# Patient Record
Sex: Male | Born: 1950 | Race: White | Hispanic: No | Marital: Married | Smoking: Former smoker
Health system: Southern US, Community
[De-identification: ages and names within clinical notes are randomized; demographics above are authoritative.]

## PROBLEM LIST (undated history)

## (undated) DIAGNOSIS — J449 Chronic obstructive pulmonary disease, unspecified: Secondary | ICD-10-CM

## (undated) DIAGNOSIS — I219 Acute myocardial infarction, unspecified: Secondary | ICD-10-CM

## (undated) DIAGNOSIS — K7689 Other specified diseases of liver: Secondary | ICD-10-CM

## (undated) DIAGNOSIS — I959 Hypotension, unspecified: Secondary | ICD-10-CM

## (undated) DIAGNOSIS — I4891 Unspecified atrial fibrillation: Secondary | ICD-10-CM

## (undated) DIAGNOSIS — I2510 Atherosclerotic heart disease of native coronary artery without angina pectoris: Secondary | ICD-10-CM

## (undated) DIAGNOSIS — I214 Non-ST elevation (NSTEMI) myocardial infarction: Secondary | ICD-10-CM

## (undated) DIAGNOSIS — I509 Heart failure, unspecified: Secondary | ICD-10-CM

## (undated) DIAGNOSIS — I700 Atherosclerosis of aorta: Secondary | ICD-10-CM

## (undated) DIAGNOSIS — Z720 Tobacco use: Secondary | ICD-10-CM

## (undated) DIAGNOSIS — E785 Hyperlipidemia, unspecified: Secondary | ICD-10-CM

## (undated) DIAGNOSIS — F329 Major depressive disorder, single episode, unspecified: Secondary | ICD-10-CM

## (undated) DIAGNOSIS — F1010 Alcohol abuse, uncomplicated: Secondary | ICD-10-CM

## (undated) DIAGNOSIS — Z7902 Long term (current) use of antithrombotics/antiplatelets: Secondary | ICD-10-CM

## (undated) DIAGNOSIS — I4892 Unspecified atrial flutter: Secondary | ICD-10-CM

## (undated) DIAGNOSIS — Z972 Presence of dental prosthetic device (complete) (partial): Secondary | ICD-10-CM

## (undated) DIAGNOSIS — I739 Peripheral vascular disease, unspecified: Secondary | ICD-10-CM

## (undated) DIAGNOSIS — I10 Essential (primary) hypertension: Secondary | ICD-10-CM

## (undated) DIAGNOSIS — G453 Amaurosis fugax: Secondary | ICD-10-CM

## (undated) DIAGNOSIS — J810 Acute pulmonary edema: Secondary | ICD-10-CM

## (undated) DIAGNOSIS — J9601 Acute respiratory failure with hypoxia: Secondary | ICD-10-CM

## (undated) DIAGNOSIS — D689 Coagulation defect, unspecified: Secondary | ICD-10-CM

## (undated) DIAGNOSIS — I6529 Occlusion and stenosis of unspecified carotid artery: Secondary | ICD-10-CM

## (undated) DIAGNOSIS — F32A Depression, unspecified: Secondary | ICD-10-CM

## (undated) DIAGNOSIS — D72829 Elevated white blood cell count, unspecified: Secondary | ICD-10-CM

## (undated) HISTORY — PX: CORONARY ARTERY BYPASS GRAFT: SHX141

## (undated) HISTORY — DX: Essential (primary) hypertension: I10

## (undated) HISTORY — DX: Depression, unspecified: F32.A

## (undated) HISTORY — DX: Unspecified atrial flutter: I48.92

## (undated) HISTORY — DX: Major depressive disorder, single episode, unspecified: F32.9

## (undated) HISTORY — DX: Hyperlipidemia, unspecified: E78.5

---

## 1998-12-26 ENCOUNTER — Encounter: Payer: Self-pay | Admitting: Emergency Medicine

## 1998-12-26 ENCOUNTER — Emergency Department (HOSPITAL_COMMUNITY): Admission: EM | Admit: 1998-12-26 | Discharge: 1998-12-26 | Payer: Self-pay | Admitting: Emergency Medicine

## 2001-05-20 ENCOUNTER — Encounter: Payer: Self-pay | Admitting: Emergency Medicine

## 2001-05-20 ENCOUNTER — Emergency Department (HOSPITAL_COMMUNITY): Admission: AC | Admit: 2001-05-20 | Discharge: 2001-05-20 | Payer: Self-pay

## 2001-05-22 ENCOUNTER — Emergency Department (HOSPITAL_COMMUNITY): Admission: EM | Admit: 2001-05-22 | Discharge: 2001-05-22 | Payer: Self-pay

## 2003-05-31 ENCOUNTER — Encounter: Admission: RE | Admit: 2003-05-31 | Discharge: 2003-05-31 | Payer: Self-pay | Admitting: Psychiatry

## 2003-06-07 ENCOUNTER — Encounter: Admission: RE | Admit: 2003-06-07 | Discharge: 2003-06-07 | Payer: Self-pay | Admitting: Psychiatry

## 2004-06-10 ENCOUNTER — Ambulatory Visit (HOSPITAL_COMMUNITY): Payer: Self-pay | Admitting: Psychiatry

## 2004-07-08 ENCOUNTER — Ambulatory Visit (HOSPITAL_COMMUNITY): Payer: Self-pay | Admitting: Psychiatry

## 2004-08-07 ENCOUNTER — Ambulatory Visit (HOSPITAL_COMMUNITY): Payer: Self-pay | Admitting: Psychiatry

## 2004-09-30 ENCOUNTER — Ambulatory Visit (HOSPITAL_COMMUNITY): Payer: Self-pay | Admitting: Psychiatry

## 2004-11-27 ENCOUNTER — Ambulatory Visit (HOSPITAL_COMMUNITY): Payer: Self-pay | Admitting: Psychiatry

## 2005-01-29 ENCOUNTER — Ambulatory Visit (HOSPITAL_COMMUNITY): Payer: Self-pay | Admitting: Physician Assistant

## 2005-04-14 ENCOUNTER — Ambulatory Visit (HOSPITAL_COMMUNITY): Payer: Self-pay | Admitting: Psychiatry

## 2005-06-16 ENCOUNTER — Ambulatory Visit (HOSPITAL_COMMUNITY): Payer: Self-pay | Admitting: Psychiatry

## 2005-08-18 ENCOUNTER — Ambulatory Visit (HOSPITAL_COMMUNITY): Payer: Self-pay | Admitting: Psychiatry

## 2005-11-05 ENCOUNTER — Ambulatory Visit (HOSPITAL_COMMUNITY): Payer: Self-pay | Admitting: Psychiatry

## 2006-01-26 ENCOUNTER — Ambulatory Visit (HOSPITAL_COMMUNITY): Payer: Self-pay | Admitting: Psychiatry

## 2006-04-22 ENCOUNTER — Ambulatory Visit (HOSPITAL_COMMUNITY): Payer: Self-pay | Admitting: Psychiatry

## 2006-06-10 ENCOUNTER — Ambulatory Visit (HOSPITAL_COMMUNITY): Payer: Self-pay | Admitting: Psychiatry

## 2006-07-30 ENCOUNTER — Ambulatory Visit: Payer: Self-pay | Admitting: Family Medicine

## 2006-08-27 ENCOUNTER — Ambulatory Visit: Payer: Self-pay | Admitting: Family Medicine

## 2006-08-27 LAB — CONVERTED CEMR LAB
Hgb A1c MFr Bld: 5.8 %
Microalbumin U total vol: 5 mg/L

## 2006-09-07 ENCOUNTER — Ambulatory Visit (HOSPITAL_COMMUNITY): Payer: Self-pay | Admitting: Psychiatry

## 2006-12-07 ENCOUNTER — Ambulatory Visit (HOSPITAL_COMMUNITY): Payer: Self-pay | Admitting: Psychiatry

## 2007-02-23 ENCOUNTER — Encounter: Payer: Self-pay | Admitting: Family Medicine

## 2007-02-24 DIAGNOSIS — R945 Abnormal results of liver function studies: Secondary | ICD-10-CM | POA: Insufficient documentation

## 2007-02-24 DIAGNOSIS — F411 Generalized anxiety disorder: Secondary | ICD-10-CM | POA: Insufficient documentation

## 2007-02-24 DIAGNOSIS — I10 Essential (primary) hypertension: Secondary | ICD-10-CM | POA: Insufficient documentation

## 2007-02-24 DIAGNOSIS — F528 Other sexual dysfunction not due to a substance or known physiological condition: Secondary | ICD-10-CM

## 2007-02-25 ENCOUNTER — Encounter: Payer: Self-pay | Admitting: Family Medicine

## 2007-03-19 ENCOUNTER — Ambulatory Visit (HOSPITAL_COMMUNITY): Payer: Self-pay | Admitting: Psychiatry

## 2007-06-09 ENCOUNTER — Ambulatory Visit (HOSPITAL_COMMUNITY): Payer: Self-pay | Admitting: Psychiatry

## 2007-09-08 ENCOUNTER — Ambulatory Visit (HOSPITAL_COMMUNITY): Payer: Self-pay | Admitting: Psychiatry

## 2007-12-08 ENCOUNTER — Ambulatory Visit (HOSPITAL_COMMUNITY): Payer: Self-pay | Admitting: Psychiatry

## 2008-03-08 ENCOUNTER — Ambulatory Visit (HOSPITAL_COMMUNITY): Payer: Self-pay | Admitting: Psychiatry

## 2008-04-26 ENCOUNTER — Ambulatory Visit (HOSPITAL_COMMUNITY): Payer: Self-pay | Admitting: Psychiatry

## 2008-05-10 ENCOUNTER — Inpatient Hospital Stay (HOSPITAL_COMMUNITY): Admission: AD | Admit: 2008-05-10 | Discharge: 2008-05-12 | Payer: Self-pay | Admitting: Psychiatry

## 2008-05-10 ENCOUNTER — Ambulatory Visit: Payer: Self-pay | Admitting: Psychiatry

## 2008-05-22 ENCOUNTER — Ambulatory Visit (HOSPITAL_COMMUNITY): Payer: Self-pay | Admitting: Psychiatry

## 2008-06-14 ENCOUNTER — Ambulatory Visit (HOSPITAL_COMMUNITY): Payer: Self-pay | Admitting: Marriage and Family Therapist

## 2008-06-19 ENCOUNTER — Ambulatory Visit (HOSPITAL_COMMUNITY): Payer: Self-pay | Admitting: Psychiatry

## 2008-06-22 ENCOUNTER — Ambulatory Visit (HOSPITAL_COMMUNITY): Payer: Self-pay | Admitting: Marriage and Family Therapist

## 2008-06-29 ENCOUNTER — Ambulatory Visit (HOSPITAL_COMMUNITY): Payer: Self-pay | Admitting: Marriage and Family Therapist

## 2008-07-06 ENCOUNTER — Ambulatory Visit (HOSPITAL_COMMUNITY): Payer: Self-pay | Admitting: Marriage and Family Therapist

## 2008-07-13 ENCOUNTER — Ambulatory Visit (HOSPITAL_COMMUNITY): Payer: Self-pay | Admitting: Marriage and Family Therapist

## 2008-07-19 ENCOUNTER — Ambulatory Visit (HOSPITAL_COMMUNITY): Payer: Self-pay | Admitting: Psychiatry

## 2008-07-20 ENCOUNTER — Ambulatory Visit (HOSPITAL_COMMUNITY): Payer: Self-pay | Admitting: Marriage and Family Therapist

## 2008-07-27 ENCOUNTER — Ambulatory Visit (HOSPITAL_COMMUNITY): Payer: Self-pay | Admitting: Marriage and Family Therapist

## 2008-08-01 ENCOUNTER — Ambulatory Visit (HOSPITAL_COMMUNITY): Payer: Self-pay | Admitting: Marriage and Family Therapist

## 2008-08-03 ENCOUNTER — Ambulatory Visit (HOSPITAL_COMMUNITY): Payer: Self-pay | Admitting: Marriage and Family Therapist

## 2008-08-10 ENCOUNTER — Ambulatory Visit (HOSPITAL_COMMUNITY): Payer: Self-pay | Admitting: Marriage and Family Therapist

## 2008-08-15 ENCOUNTER — Ambulatory Visit (HOSPITAL_COMMUNITY): Payer: Self-pay | Admitting: Marriage and Family Therapist

## 2008-08-17 ENCOUNTER — Ambulatory Visit (HOSPITAL_COMMUNITY): Payer: Self-pay | Admitting: Marriage and Family Therapist

## 2008-08-21 ENCOUNTER — Ambulatory Visit (HOSPITAL_COMMUNITY): Payer: Self-pay | Admitting: Marriage and Family Therapist

## 2008-08-23 ENCOUNTER — Ambulatory Visit (HOSPITAL_COMMUNITY): Payer: Self-pay | Admitting: Psychiatry

## 2008-08-28 ENCOUNTER — Ambulatory Visit (HOSPITAL_COMMUNITY): Payer: Self-pay | Admitting: Marriage and Family Therapist

## 2008-09-07 ENCOUNTER — Ambulatory Visit (HOSPITAL_COMMUNITY): Payer: Self-pay | Admitting: Marriage and Family Therapist

## 2008-09-14 ENCOUNTER — Ambulatory Visit (HOSPITAL_COMMUNITY): Payer: Self-pay | Admitting: Marriage and Family Therapist

## 2008-10-02 ENCOUNTER — Ambulatory Visit (HOSPITAL_COMMUNITY): Payer: Self-pay | Admitting: Psychiatry

## 2008-10-11 ENCOUNTER — Ambulatory Visit (HOSPITAL_COMMUNITY): Payer: Self-pay | Admitting: Marriage and Family Therapist

## 2008-10-18 ENCOUNTER — Ambulatory Visit (HOSPITAL_COMMUNITY): Payer: Self-pay | Admitting: Marriage and Family Therapist

## 2008-10-26 ENCOUNTER — Ambulatory Visit (HOSPITAL_COMMUNITY): Payer: Self-pay | Admitting: Marriage and Family Therapist

## 2008-11-02 ENCOUNTER — Ambulatory Visit (HOSPITAL_COMMUNITY): Payer: Self-pay | Admitting: Marriage and Family Therapist

## 2008-11-14 ENCOUNTER — Ambulatory Visit (HOSPITAL_COMMUNITY): Payer: Self-pay | Admitting: Marriage and Family Therapist

## 2008-11-23 ENCOUNTER — Ambulatory Visit (HOSPITAL_COMMUNITY): Payer: Self-pay | Admitting: Marriage and Family Therapist

## 2008-11-29 ENCOUNTER — Ambulatory Visit (HOSPITAL_COMMUNITY): Payer: Self-pay | Admitting: Marriage and Family Therapist

## 2008-12-04 ENCOUNTER — Ambulatory Visit (HOSPITAL_COMMUNITY): Payer: Self-pay | Admitting: Psychiatry

## 2008-12-06 ENCOUNTER — Ambulatory Visit (HOSPITAL_COMMUNITY): Payer: Self-pay | Admitting: Marriage and Family Therapist

## 2008-12-13 ENCOUNTER — Ambulatory Visit (HOSPITAL_COMMUNITY): Payer: Self-pay | Admitting: Marriage and Family Therapist

## 2008-12-20 ENCOUNTER — Ambulatory Visit (HOSPITAL_COMMUNITY): Payer: Self-pay | Admitting: Marriage and Family Therapist

## 2008-12-27 ENCOUNTER — Ambulatory Visit (HOSPITAL_COMMUNITY): Payer: Self-pay | Admitting: Marriage and Family Therapist

## 2009-01-11 ENCOUNTER — Ambulatory Visit (HOSPITAL_COMMUNITY): Payer: Self-pay | Admitting: Marriage and Family Therapist

## 2009-01-29 ENCOUNTER — Ambulatory Visit (HOSPITAL_COMMUNITY): Payer: Self-pay | Admitting: Psychiatry

## 2009-01-30 ENCOUNTER — Ambulatory Visit (HOSPITAL_COMMUNITY): Payer: Self-pay | Admitting: Marriage and Family Therapist

## 2009-02-08 ENCOUNTER — Ambulatory Visit (HOSPITAL_COMMUNITY): Payer: Self-pay | Admitting: Marriage and Family Therapist

## 2009-02-15 ENCOUNTER — Ambulatory Visit (HOSPITAL_COMMUNITY): Payer: Self-pay | Admitting: Marriage and Family Therapist

## 2009-03-01 ENCOUNTER — Ambulatory Visit (HOSPITAL_COMMUNITY): Payer: Self-pay | Admitting: Marriage and Family Therapist

## 2009-03-08 ENCOUNTER — Ambulatory Visit (HOSPITAL_COMMUNITY): Payer: Self-pay | Admitting: Marriage and Family Therapist

## 2009-03-15 ENCOUNTER — Ambulatory Visit (HOSPITAL_COMMUNITY): Payer: Self-pay | Admitting: Licensed Clinical Social Worker

## 2009-03-22 ENCOUNTER — Ambulatory Visit (HOSPITAL_COMMUNITY): Payer: Self-pay | Admitting: Licensed Clinical Social Worker

## 2009-03-23 ENCOUNTER — Ambulatory Visit (HOSPITAL_COMMUNITY): Payer: Self-pay | Admitting: Psychiatry

## 2009-04-12 ENCOUNTER — Ambulatory Visit (HOSPITAL_COMMUNITY): Payer: Self-pay | Admitting: Marriage and Family Therapist

## 2009-04-19 ENCOUNTER — Ambulatory Visit (HOSPITAL_COMMUNITY): Payer: Self-pay | Admitting: Marriage and Family Therapist

## 2009-04-26 ENCOUNTER — Ambulatory Visit (HOSPITAL_COMMUNITY): Payer: Self-pay | Admitting: Marriage and Family Therapist

## 2009-05-10 ENCOUNTER — Ambulatory Visit (HOSPITAL_COMMUNITY): Payer: Self-pay | Admitting: Licensed Clinical Social Worker

## 2009-05-14 ENCOUNTER — Ambulatory Visit (HOSPITAL_COMMUNITY): Payer: Self-pay | Admitting: Psychiatry

## 2009-05-24 ENCOUNTER — Ambulatory Visit (HOSPITAL_COMMUNITY): Payer: Self-pay | Admitting: Licensed Clinical Social Worker

## 2009-06-07 ENCOUNTER — Ambulatory Visit (HOSPITAL_COMMUNITY): Payer: Self-pay | Admitting: Marriage and Family Therapist

## 2009-06-21 ENCOUNTER — Ambulatory Visit (HOSPITAL_COMMUNITY): Payer: Self-pay | Admitting: Marriage and Family Therapist

## 2009-07-05 ENCOUNTER — Ambulatory Visit (HOSPITAL_COMMUNITY): Payer: Self-pay | Admitting: Marriage and Family Therapist

## 2009-07-09 ENCOUNTER — Emergency Department: Payer: Self-pay | Admitting: Emergency Medicine

## 2009-07-18 ENCOUNTER — Ambulatory Visit (HOSPITAL_COMMUNITY): Payer: Self-pay | Admitting: Psychiatry

## 2009-07-19 ENCOUNTER — Ambulatory Visit (HOSPITAL_COMMUNITY): Payer: Self-pay | Admitting: Marriage and Family Therapist

## 2009-08-02 ENCOUNTER — Ambulatory Visit (HOSPITAL_COMMUNITY): Payer: Self-pay | Admitting: Marriage and Family Therapist

## 2009-08-16 ENCOUNTER — Ambulatory Visit (HOSPITAL_COMMUNITY): Payer: Self-pay | Admitting: Marriage and Family Therapist

## 2009-09-03 ENCOUNTER — Ambulatory Visit (HOSPITAL_COMMUNITY): Payer: Self-pay | Admitting: Psychiatry

## 2009-09-07 ENCOUNTER — Ambulatory Visit (HOSPITAL_COMMUNITY): Payer: Self-pay | Admitting: Psychiatry

## 2009-09-17 ENCOUNTER — Ambulatory Visit (HOSPITAL_COMMUNITY): Payer: Self-pay | Admitting: Psychiatry

## 2009-10-16 ENCOUNTER — Ambulatory Visit (HOSPITAL_COMMUNITY): Payer: Self-pay | Admitting: Marriage and Family Therapist

## 2009-11-09 ENCOUNTER — Ambulatory Visit (HOSPITAL_COMMUNITY): Payer: Self-pay | Admitting: Psychiatry

## 2009-11-13 ENCOUNTER — Ambulatory Visit (HOSPITAL_COMMUNITY): Payer: Self-pay | Admitting: Marriage and Family Therapist

## 2009-12-11 ENCOUNTER — Ambulatory Visit (HOSPITAL_COMMUNITY): Payer: Self-pay | Admitting: Marriage and Family Therapist

## 2010-01-04 ENCOUNTER — Ambulatory Visit (HOSPITAL_COMMUNITY): Payer: Self-pay | Admitting: Psychiatry

## 2010-01-15 ENCOUNTER — Ambulatory Visit (HOSPITAL_COMMUNITY): Payer: Self-pay | Admitting: Marriage and Family Therapist

## 2010-02-19 ENCOUNTER — Ambulatory Visit (HOSPITAL_COMMUNITY): Payer: Self-pay | Admitting: Marriage and Family Therapist

## 2010-03-04 ENCOUNTER — Ambulatory Visit (HOSPITAL_COMMUNITY): Payer: Self-pay | Admitting: Psychiatry

## 2010-03-19 ENCOUNTER — Ambulatory Visit (HOSPITAL_COMMUNITY): Payer: Self-pay | Admitting: Marriage and Family Therapist

## 2010-04-16 ENCOUNTER — Ambulatory Visit (HOSPITAL_COMMUNITY): Payer: Self-pay | Admitting: Marriage and Family Therapist

## 2010-04-29 ENCOUNTER — Ambulatory Visit (HOSPITAL_COMMUNITY): Payer: Self-pay | Admitting: Psychiatry

## 2010-05-16 ENCOUNTER — Ambulatory Visit (HOSPITAL_COMMUNITY): Payer: Self-pay | Admitting: Marriage and Family Therapist

## 2010-06-27 ENCOUNTER — Ambulatory Visit (HOSPITAL_COMMUNITY): Payer: Self-pay | Admitting: Marriage and Family Therapist

## 2010-07-22 ENCOUNTER — Ambulatory Visit (HOSPITAL_COMMUNITY): Payer: Self-pay | Admitting: Psychiatry

## 2010-07-30 ENCOUNTER — Ambulatory Visit (HOSPITAL_COMMUNITY): Payer: Self-pay | Admitting: Marriage and Family Therapist

## 2010-09-02 ENCOUNTER — Ambulatory Visit (HOSPITAL_COMMUNITY): Payer: Self-pay | Admitting: Marriage and Family Therapist

## 2010-09-16 ENCOUNTER — Ambulatory Visit (HOSPITAL_COMMUNITY): Payer: Self-pay | Admitting: Psychiatry

## 2010-11-11 ENCOUNTER — Encounter (HOSPITAL_COMMUNITY): Payer: BC Managed Care – PPO | Admitting: Marriage and Family Therapist

## 2010-11-11 DIAGNOSIS — F339 Major depressive disorder, recurrent, unspecified: Secondary | ICD-10-CM

## 2010-11-20 ENCOUNTER — Encounter (HOSPITAL_COMMUNITY): Payer: Self-pay | Admitting: Psychiatry

## 2010-12-04 ENCOUNTER — Encounter (HOSPITAL_COMMUNITY): Payer: BC Managed Care – PPO | Admitting: Psychiatry

## 2010-12-04 DIAGNOSIS — F331 Major depressive disorder, recurrent, moderate: Secondary | ICD-10-CM

## 2011-02-05 ENCOUNTER — Encounter (HOSPITAL_COMMUNITY): Payer: BC Managed Care – PPO | Admitting: Psychiatry

## 2011-02-07 ENCOUNTER — Encounter (HOSPITAL_COMMUNITY): Payer: BC Managed Care – PPO | Admitting: Psychiatry

## 2011-02-07 DIAGNOSIS — F331 Major depressive disorder, recurrent, moderate: Secondary | ICD-10-CM
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
## 2011-04-09 ENCOUNTER — Encounter (HOSPITAL_COMMUNITY): Payer: BC Managed Care – PPO | Admitting: Psychiatry

## 2011-04-09 DIAGNOSIS — F331 Major depressive disorder, recurrent, moderate: Secondary | ICD-10-CM

## 2011-06-11 ENCOUNTER — Encounter (INDEPENDENT_AMBULATORY_CARE_PROVIDER_SITE_OTHER): Payer: BC Managed Care – PPO | Admitting: Psychiatry

## 2011-06-11 DIAGNOSIS — F331 Major depressive disorder, recurrent, moderate: Secondary | ICD-10-CM

## 2011-06-27 LAB — DRUGS OF ABUSE SCREEN W/O ALC, ROUTINE URINE
Amphetamine Screen, Ur: NEGATIVE
Benzodiazepines.: NEGATIVE
Phencyclidine (PCP): NEGATIVE
Propoxyphene: NEGATIVE

## 2011-06-27 LAB — COMPREHENSIVE METABOLIC PANEL
BUN: 12
Calcium: 9.4
GFR calc non Af Amer: >60
Glucose, Bld: 88
Total Protein: 6.3

## 2011-06-27 LAB — URINALYSIS, ROUTINE W REFLEX MICROSCOPIC
Bilirubin Urine: NEGATIVE
Glucose, UA: NEGATIVE
Hgb urine dipstick: NEGATIVE
Ketones, ur: NEGATIVE
Nitrite: NEGATIVE
pH: 5.5

## 2011-06-27 LAB — CBC
HCT: 47.5
Hemoglobin: 15.9
MCHC: 33.4
MCV: 91.2
RDW: 14.8

## 2011-08-05 ENCOUNTER — Other Ambulatory Visit (HOSPITAL_COMMUNITY): Payer: Self-pay

## 2011-08-06 ENCOUNTER — Encounter (HOSPITAL_COMMUNITY): Payer: Self-pay | Admitting: Psychiatry

## 2011-08-06 ENCOUNTER — Ambulatory Visit (INDEPENDENT_AMBULATORY_CARE_PROVIDER_SITE_OTHER): Payer: BC Managed Care – PPO | Admitting: Psychiatry

## 2011-08-06 DIAGNOSIS — F331 Major depressive disorder, recurrent, moderate: Secondary | ICD-10-CM

## 2011-08-06 MED ORDER — SERTRALINE HCL 100 MG PO TABS: 100.0000 mg | ORAL_TABLET | Freq: Every day | ORAL | Status: DC

## 2011-08-06 MED ORDER — CLONAZEPAM 0.5 MG PO TABS: 0.5000 mg | ORAL_TABLET | Freq: Every day | ORAL | Status: DC

## 2011-08-06 NOTE — Progress Notes (Signed)
Patient in today for his followup appointment. He needs a refill of his Zoloft and clonazepam. He's been stable on these medications and reported no side effects. He is not taking risperidone anymore. He reported less symptoms of depression anxiety. He is sleeping good and there are longer crying spells. There is no paranoia agitation or anger. Patient is calm cooperative and pleasant. His speech is clear and coherent. His thought was logical linear and goal directed. There are no psychotic symptoms present. He reported his mood is good and his affect was pleasant. He denies any active or passive suicidal thinking homicidal thinking or any visual or auditory hallucination. He is alert and oriented x3. There are no abnormal movements noted. His insight judgment impulse control is okay. Assessment Major depressive disorder recurrent Plan we'll continue his current medications Zoloft and Klonopin and I will see him again in 2 months.

## 2011-10-06 ENCOUNTER — Ambulatory Visit (HOSPITAL_COMMUNITY): Payer: BC Managed Care – PPO | Admitting: Psychiatry

## 2011-10-22 ENCOUNTER — Ambulatory Visit (INDEPENDENT_AMBULATORY_CARE_PROVIDER_SITE_OTHER): Payer: BC Managed Care – PPO | Admitting: Psychiatry

## 2011-10-22 DIAGNOSIS — F331 Major depressive disorder, recurrent, moderate: Secondary | ICD-10-CM

## 2011-10-22 DIAGNOSIS — F329 Major depressive disorder, single episode, unspecified: Secondary | ICD-10-CM

## 2011-10-22 MED ORDER — SERTRALINE HCL 100 MG PO TABS: 100.0000 mg | ORAL_TABLET | Freq: Every day | ORAL | Status: DC

## 2011-10-22 NOTE — Progress Notes (Signed)
Patient came in today for his followup appointment. She's been compliant with his Zoloft and takes Klonopin only as needed. A good Christmas and holiday. His granddaughter is also getting better and gained some weight. Denies any agitation anger or mood swings. He denies any side effects of medication. He is sleeping better and recently there are no crying spells. He denies any agitation anger or paranoid thinking. He likes to continue his Zoloft and Klonopin only as needed. He still has one refill remaining on Klonopin. He does not drink alcohol or use illegal substances the  Mental status examination Patient is casually dressed and well groomed. His speech is soft clear and coherent. His thought process is logical linear and goal-directed. He described his mood is anxious and his affect is constricted. He denies any active or passive suicidal thoughts or homicidal thoughts. There no psychotic symptoms present. He's alert and oriented x3. His insight judgment and pulse control is okay.  Assessment Maj. depressive disorder  Plan I will continue his Zoloft 100 mg daily. He is still have Klonopin refill remaining which he uses as needed. He never ask only refills of Klonopin. I have explained risks and benefits of medication in detail. I will see him again in 3 months

## 2012-01-21 ENCOUNTER — Ambulatory Visit (HOSPITAL_COMMUNITY): Payer: BC Managed Care – PPO | Admitting: Psychiatry

## 2012-02-11 ENCOUNTER — Ambulatory Visit (INDEPENDENT_AMBULATORY_CARE_PROVIDER_SITE_OTHER): Payer: BC Managed Care – PPO | Admitting: Psychiatry

## 2012-02-11 ENCOUNTER — Encounter (HOSPITAL_COMMUNITY): Payer: Self-pay | Admitting: Psychiatry

## 2012-02-11 DIAGNOSIS — F331 Major depressive disorder, recurrent, moderate: Secondary | ICD-10-CM

## 2012-02-11 DIAGNOSIS — F329 Major depressive disorder, single episode, unspecified: Secondary | ICD-10-CM

## 2012-02-11 MED ORDER — SERTRALINE HCL 100 MG PO TABS: 100.0000 mg | ORAL_TABLET | Freq: Every day | ORAL | Status: DC

## 2012-02-11 MED ORDER — CLONAZEPAM 0.5 MG PO TABS: 0.5000 mg | ORAL_TABLET | Freq: Every day | ORAL | Status: DC

## 2012-02-11 NOTE — Progress Notes (Signed)
Chief complaint Medication management and followup.  History of presenting illness Patient is 61 year old Caucasian married male who came for his followup appointment.  Patient has been compliant with his Zoloft and Klonopin as needed.  He had missed his appointment as he was out of the town to attend funeral .  Overall his mood has been stable.  He denies any agitation anger mood swing.  He denies any recent crying spells.  He likes his current Zoloft.  He has no issues including any tremors or shakes.  He's not drinking or using any illegal substance.  He sleeps fine.  He has some anxiety however he denies any recent panic attack.    Current psychiatric medication Zoloft 100 mg daily Klonopin 0.5 mg one tablet as needed  Past psychiatric history Patient has history of sued depression anxiety.  He has at least one psychiatric admission in 2009.  At that time he was very depressed and her a lot of stress.  Denies any history of suicidal attempt.  He do not recall taking any other medication however he was given Risperdal which he took for a long time due to paranoia.  Alcohol and substance use history Patient endorse history of heavy drinking however he quit drinking 9 years ago.  Medical history Patient has history of hypertension but he denies taking any medication.  Mental status examination Patient is casually dressed and well groomed.  He is pleasant calm and cooperative.  His speech is soft clear and coherent. His thought process is logical linear and goal-directed.  He maintained good eye contact.  He described his mood is anxious and his affect is constricted. He denies any active or passive suicidal thoughts or homicidal thoughts. There no psychotic symptoms present. He's alert and oriented x3. His insight judgment and pulse control is okay.  Assessment Axis I Maj. depressive disorder Axis II deferred Axis III history of hypertension Axis IV mild to moderate  Plan I will  continue his Zoloft 100 mg daily. He is still have Klonopin refill remaining which he uses as needed. He never ask only refills of Klonopin. I have explained risks and benefits of medication in detail. I will see him again in 3 months

## 2012-03-03 ENCOUNTER — Ambulatory Visit (HOSPITAL_COMMUNITY): Payer: Self-pay | Admitting: Psychiatry

## 2012-05-10 ENCOUNTER — Ambulatory Visit (HOSPITAL_COMMUNITY): Payer: Self-pay | Admitting: Psychiatry

## 2012-06-02 ENCOUNTER — Ambulatory Visit (INDEPENDENT_AMBULATORY_CARE_PROVIDER_SITE_OTHER): Payer: BC Managed Care – PPO | Admitting: Psychiatry

## 2012-06-02 ENCOUNTER — Encounter (HOSPITAL_COMMUNITY): Payer: Self-pay | Admitting: Psychiatry

## 2012-06-02 VITALS — BP 160/76 | HR 47 | Wt 173.6 lb

## 2012-06-02 DIAGNOSIS — F329 Major depressive disorder, single episode, unspecified: Secondary | ICD-10-CM

## 2012-06-02 DIAGNOSIS — F331 Major depressive disorder, recurrent, moderate: Secondary | ICD-10-CM

## 2012-06-02 MED ORDER — SERTRALINE HCL 100 MG PO TABS: 100.0000 mg | ORAL_TABLET | Freq: Every day | ORAL | Status: DC

## 2012-06-02 MED ORDER — CLONAZEPAM 0.5 MG PO TABS: 0.5000 mg | ORAL_TABLET | Freq: Every day | ORAL | Status: DC

## 2012-06-02 NOTE — Progress Notes (Signed)
Chief complaint Medication management and followup.  History of presenting illness Patient is 61 year old Caucasian married male who came for his followup appointment.  Patient has been compliant with his Zoloft.  Today he mentioned that sometime he takes Risperdal however he has not requested any refill since September 2012.  He still takes Klonopin only when he is very anxious.  He denies any agitation anger mood swing.  He sleeps good.  He's not drinking.  His grandson is also doing better.  His excited as he is going to [place].  He denies any crying spells.  He denies any tremors or shakes.  He's not using any illegal substance.  Current psychiatric medication Zoloft 100 mg daily Risperidal? Klonopin 0.5 mg one tablet as needed  Past psychiatric history Patient has history of severe depression anxiety.  He has at least one psychiatric admission in 2009.  At that time he was very depressed and her a lot of stress.  Denies any history of suicidal attempt.  However he has history of paranoia.    Alcohol and substance use history Patient endorse history of heavy drinking in past.   Medical history Patient has history of hypertension but he denies taking any medication.  Mental status examination Patient is casually dressed and well groomed.  He is pleasant calm and cooperative.  His speech is soft clear and coherent. His thought process is logical linear and goal-directed.  He maintained good eye contact.  He described his mood is anxious and his affect is constricted. He denies any active or passive suicidal thoughts or homicidal thoughts.  He denies any auditory or visual hallucination.  There were no flight of ideas or loose association.  There no psychotic symptoms present. He's alert and oriented x3. His insight judgment and pulse control is okay.  Assessment Axis I Maj. depressive disorder Axis II deferred Axis III history of hypertension Axis IV mild to moderate  Plan I talked to  him about his Risperdal which has not given since September 2012.  I recommend to call us and provide detail about Risperdal dosage and direction.  He's been getting Risperdal from different pharmacy and do not recall which pharmacy has refill his last prescription.  He is not taking Klonopin every day , I will provide Klonopin only 30 pills however I refilled Zoloft but additional 2 refills.  I recommend to call us if he is any question or concern about the medication if he feels worsening of the symptom.  Time spent 30 minutes.

## 2012-08-24 ENCOUNTER — Telehealth: Payer: Self-pay | Admitting: Medical Oncology

## 2012-08-24 NOTE — Telephone Encounter (Signed)
Erroneous encounter

## 2012-09-01 ENCOUNTER — Ambulatory Visit (HOSPITAL_COMMUNITY): Payer: Self-pay | Admitting: Psychiatry

## 2012-09-09 ENCOUNTER — Encounter (HOSPITAL_COMMUNITY): Payer: Self-pay | Admitting: Psychiatry

## 2012-09-09 ENCOUNTER — Ambulatory Visit (INDEPENDENT_AMBULATORY_CARE_PROVIDER_SITE_OTHER): Payer: BC Managed Care – PPO | Admitting: Psychiatry

## 2012-09-09 DIAGNOSIS — F329 Major depressive disorder, single episode, unspecified: Secondary | ICD-10-CM

## 2012-09-09 DIAGNOSIS — F331 Major depressive disorder, recurrent, moderate: Secondary | ICD-10-CM

## 2012-09-09 MED ORDER — SERTRALINE HCL 50 MG PO TABS: 50.0000 mg | ORAL_TABLET | Freq: Every day | ORAL | Status: DC

## 2012-09-09 MED ORDER — CLONAZEPAM 0.5 MG PO TABS: 0.5000 mg | ORAL_TABLET | Freq: Every day | ORAL | Status: DC

## 2012-09-09 NOTE — Progress Notes (Signed)
Patient ID: Dennis Preston, male   DOB: 1951-07-05, 61 y.o.   MRN: [id number] Chief complaint Medication management and followup.  History of presenting illness Patient is 61 year old Caucasian married male who came for his followup appointment.  He stopped taking Risperdal more than 6 months ago .  He is taking Zoloft 50 mg only for past 2 months.  He cut down his Zoloft on his own as feel he does not needed.  He's actually doing very well on 50 mg of Zoloft.  He denies any agitation anger mood swing.  He is sleeping better.  He denies any anxiety or any crying spells.  He is sleeping better.  He takes Klonopin for severe anxiety .  He was given Klonopin 30 tablet in September and he still has few remaining.  He does not drink or ask for early refills of Klonopin.  He denies any side effects of medication.  His daughter is doing very well.  He had a nice Thanksgiving and is hoping to have a nice Christmas.    Current psychiatric medication Zoloft 50 mg daily Risperidal? Klonopin 0.5 mg one tablet as needed  Past psychiatric history Patient has history of severe depression anxiety.  He has at least one psychiatric admission in 2009.  At that time he was very depressed and her a lot of stress.  Denies any history of suicidal attempt.  However he has history of paranoia.    Alcohol and substance use history Patient endorse history of heavy drinking in past.   Medical history Patient has history of hypertension but he claims that his blood pressure is okay.  He does not take any medication for his blood pressure.  He see Dr. Geralyn Corwin in [place].  He has a history of high liver enzymes however he has not done any blood test in recent months.    Review of Systems  Constitutional: Negative.   HENT: Negative for neck pain.   Musculoskeletal: Positive for back pain. Negative for myalgias and joint pain.  Neurological: Negative.   Psychiatric/Behavioral: Negative for depression, suicidal ideas,  hallucinations and substance abuse. The patient is nervous/anxious. The patient does not have insomnia.    Mental status examination Patient is casually dressed and well groomed.  He is pleasant calm and cooperative.  His speech is soft clear and coherent. His thought process is logical linear and goal-directed.  He maintained good eye contact.  He described his mood is anxious and his affect is constricted. He denies any active or passive suicidal thoughts or homicidal thoughts.  He denies any auditory or visual hallucination.  There were no flight of ideas or loose association.  There no psychotic symptoms present. He's alert and oriented x3. His insight judgment and pulse control is okay.  Assessment Axis I Maj. depressive disorder Axis II deferred Axis III history of hypertension Axis IV mild to moderate  Plan Patient is taking Zoloft and Klonopin.  He stopped taking Risperdal.  He had cut down his Zoloft to 50 mg.  He is stable on his medication.  I will continue his current psychiatric medication.  I recommend to call us if he feel worsening of the symptom.  I encourage him to do blood work from his primary care physician and faxed to [place] the results.  I will see him again in 3 months.

## 2012-12-08 ENCOUNTER — Encounter (HOSPITAL_COMMUNITY): Payer: Self-pay | Admitting: Psychiatry

## 2012-12-08 ENCOUNTER — Ambulatory Visit (INDEPENDENT_AMBULATORY_CARE_PROVIDER_SITE_OTHER): Payer: BC Managed Care – PPO | Admitting: Psychiatry

## 2012-12-08 DIAGNOSIS — F331 Major depressive disorder, recurrent, moderate: Secondary | ICD-10-CM

## 2012-12-08 DIAGNOSIS — F329 Major depressive disorder, single episode, unspecified: Secondary | ICD-10-CM

## 2012-12-08 MED ORDER — SERTRALINE HCL 50 MG PO TABS: 50.0000 mg | ORAL_TABLET | Freq: Every day | ORAL | Status: DC
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
## 2013-03-10 ENCOUNTER — Ambulatory Visit (INDEPENDENT_AMBULATORY_CARE_PROVIDER_SITE_OTHER): Payer: BC Managed Care – PPO | Admitting: Psychiatry

## 2013-03-10 ENCOUNTER — Encounter (HOSPITAL_COMMUNITY): Payer: Self-pay | Admitting: Psychiatry

## 2013-03-10 VITALS — Wt 176.0 lb

## 2013-03-10 DIAGNOSIS — F329 Major depressive disorder, single episode, unspecified: Secondary | ICD-10-CM

## 2013-03-10 NOTE — Progress Notes (Signed)
Patient ID: Dennis Preston, male   DOB: 15-Jul-1951, 62 y.o.   MRN: [id number] Chief complaint I stopped taking Zoloft.  I'm doing fine.    History of presenting illness Patient is 62 year old Caucasian married male who came for his followup appointment.   he stopped taking Zoloft.  He is feeling fine.  He had cut down his Zoloft slowly and gradually.  In past 2 weeks he has taken only half tablet twice.  He feels good.  He's sleeping better.  Any crying spells irritability anger or any mood swing.  He used to take Risperdal and Zoloft however in past few months he is gradually coming off from psychotropic medication.  His family situation is better.  He likes to handle his depression without psychotropic medication.  Is not drinking or using any illegal substance.  He denies any paranoia or any hallucination.  He denies any crying spells or any social isolation.  Current psychiatric medication None.    Past psychiatric history Patient has history of severe depression anxiety.  He has at least one psychiatric admission in 2009.  At that time he was very depressed and her a lot of stress.  Denies any history of suicidal attempt.  However he has history of paranoia.    Alcohol and substance use history Patient endorse history of heavy drinking in past.   Medical history Patient has history of hypertension but he he is not taking any medication .  He see Dr. Geralyn Corwin in [place].  He has a history of high liver enzymes however he claimed that his last liver enzymes are normal.      Review of Systems  Constitutional: Negative.   HENT: Negative for neck pain.   Musculoskeletal: Positive for back pain. Negative for myalgias and joint pain.  Neurological: Negative.   Psychiatric/Behavioral: Negative for depression, suicidal ideas, hallucinations and substance abuse. The patient does not have insomnia.    Mental status examination Patient is casually dressed and well groomed.  He is pleasant calm and  cooperative.  His speech is soft clear and coherent. His thought process is logical linear and goal-directed.  He maintained good eye contact.  He described his mood is  good and his affect is mood appropriate.  He denies any active or passive suicidal thoughts or homicidal thoughts.  He denies any auditory or visual hallucination.  There were no flight of ideas or loose association.  There no psychotic symptoms present. He's alert and oriented x3. His insight judgment and pulse control is okay.  Assessment Axis I Maj. depressive disorder Axis II deferred Axis III history of hypertension Axis IV mild to moderate  Plan  I will discontinue Zoloft since patient is no longer taking it.  Discussed in detail the risk of relapse with noncompliance with medication.  Recommend to call us back if he has any question or if he feel worsening of the symptoms.  No new prescription given.  Safety plan discussed that anytime having symptoms coming back and he had to call for the appointment.  I will see him again in 3 months .

## 2013-06-13 ENCOUNTER — Encounter (HOSPITAL_COMMUNITY): Payer: Self-pay | Admitting: Psychiatry

## 2013-06-13 ENCOUNTER — Ambulatory Visit (INDEPENDENT_AMBULATORY_CARE_PROVIDER_SITE_OTHER): Payer: BC Managed Care – PPO | Admitting: Psychiatry

## 2013-06-13 DIAGNOSIS — F329 Major depressive disorder, single episode, unspecified: Secondary | ICD-10-CM

## 2013-06-13 NOTE — Progress Notes (Signed)
Patient ID: Dennis Preston, male   DOB: 01-02-1951, 62 y.o.   MRN: [id number] Chief complaint Doing better without medication.    History of presenting illness Patient is 62 year old Caucasian married male who came for his followup appointment.  Patient is not taking any Zoloft at this time.  He stopped taking medication for at least three-month period.  Patient is sleeping better.  He denies any anger, crying spells.  His family is also doing much better.  He denies any agitation, anger or any mood swing.  He is not drinking or using any illicit substances.  He denies any paranoia or any hallucination.  He had a good family support.  Patient has cut down his medication slowly and gradually.  He is not experiencing any withdrawal symptoms.  Current psychiatric medication None.    Past psychiatric history Patient has history of severe depression anxiety.  He has at least one psychiatric admission in 2009.  At that time he was very depressed and her a lot of stress.  Denies any history of suicidal attempt.  However he has history of paranoia.    Alcohol and substance use history Patient endorse history of heavy drinking in past.   Medical history Patient has history of hypertension but he is not taking any medication .  He see Dr. Geralyn Corwin in [place].  He has a history of high liver enzymes however he claimed that his last liver enzymes are normal.      Review of Systems  Constitutional: Negative.   HENT: Negative for neck pain.   Musculoskeletal: Positive for back pain. Negative for myalgias and joint pain.  Neurological: Negative.   Psychiatric/Behavioral: Negative for depression, suicidal ideas, hallucinations and substance abuse. The patient does not have insomnia.    Mental status examination Patient is casually dressed and well groomed.  He is pleasant calm and cooperative.  His speech is soft clear and coherent. His thought process is logical linear and goal-directed.  He maintained good  eye contact.  He described his mood is  good and his affect is mood appropriate.  He denies any active or passive suicidal thoughts or homicidal thoughts.  He denies any auditory or visual hallucination.  There were no flight of ideas or loose association.  There no psychotic symptoms present. He's alert and oriented x3. His insight judgment and pulse control is okay.  Assessment Axis I Maj. depressive disorder Axis II deferred Axis III history of hypertension Axis IV mild to moderate  Plan Patient is without the Zoloft for the past 3 months.  He does not want to restart the medication.  However he understand that if symptoms as started to get appear and if he started to feel depressed again , he will call as to schedule appointment.  Discussed in detail the risks and benefits of noncompliance with medication .  Recommended to call us if he has any questions or any concern .  We will close the case however if needed we will scheduled appointment.  Discussed a safety plan that anytime having active suicidal thoughts or homicidal thought that he needed to call 911 or go to a local emergency room.  No prescription given.  No followup appointment is scheduled.

## 2015-07-09 ENCOUNTER — Emergency Department: Payer: Medicare Other

## 2015-07-09 ENCOUNTER — Other Ambulatory Visit: Payer: Self-pay

## 2015-07-09 ENCOUNTER — Inpatient Hospital Stay
Admission: EM | Admit: 2015-07-09 | Discharge: 2015-07-10 | DRG: 189 | Disposition: A | Discharge status: Other Acute Inpatient Hospital | Payer: Medicare Other | Attending: Specialist | Admitting: Specialist

## 2015-07-09 ENCOUNTER — Encounter: Payer: Self-pay | Admitting: Emergency Medicine

## 2015-07-09 DIAGNOSIS — R000 Tachycardia, unspecified: Secondary | ICD-10-CM | POA: Diagnosis present

## 2015-07-09 DIAGNOSIS — E785 Hyperlipidemia, unspecified: Secondary | ICD-10-CM | POA: Diagnosis present

## 2015-07-09 DIAGNOSIS — J209 Acute bronchitis, unspecified: Secondary | ICD-10-CM | POA: Diagnosis present

## 2015-07-09 DIAGNOSIS — J441 Chronic obstructive pulmonary disease with (acute) exacerbation: Secondary | ICD-10-CM | POA: Diagnosis not present

## 2015-07-09 DIAGNOSIS — Z7952 Long term (current) use of systemic steroids: Secondary | ICD-10-CM

## 2015-07-09 DIAGNOSIS — F329 Major depressive disorder, single episode, unspecified: Secondary | ICD-10-CM | POA: Diagnosis present

## 2015-07-09 DIAGNOSIS — J9601 Acute respiratory failure with hypoxia: Secondary | ICD-10-CM | POA: Diagnosis not present

## 2015-07-09 DIAGNOSIS — Z818 Family history of other mental and behavioral disorders: Secondary | ICD-10-CM

## 2015-07-09 DIAGNOSIS — R0902 Hypoxemia: Secondary | ICD-10-CM | POA: Diagnosis present

## 2015-07-09 DIAGNOSIS — G4734 Idiopathic sleep related nonobstructive alveolar hypoventilation: Secondary | ICD-10-CM | POA: Diagnosis present

## 2015-07-09 DIAGNOSIS — I214 Non-ST elevation (NSTEMI) myocardial infarction: Secondary | ICD-10-CM | POA: Diagnosis present

## 2015-07-09 DIAGNOSIS — J440 Chronic obstructive pulmonary disease with acute lower respiratory infection: Secondary | ICD-10-CM | POA: Diagnosis present

## 2015-07-09 DIAGNOSIS — Z79899 Other long term (current) drug therapy: Secondary | ICD-10-CM

## 2015-07-09 DIAGNOSIS — F17200 Nicotine dependence, unspecified, uncomplicated: Secondary | ICD-10-CM | POA: Diagnosis present

## 2015-07-09 LAB — COMPREHENSIVE METABOLIC PANEL
ALBUMIN: 4.4 g/dL (ref 3.5–5.0)
ALT: 13 U/L — ABNORMAL LOW (ref 17–63)
AST: 22 U/L (ref 15–41)
Alkaline Phosphatase: 90 U/L (ref 38–126)
Anion gap: 9 (ref 5–15)
BUN: 14 mg/dL (ref 6–20)
CHLORIDE: 105 mmol/L (ref 101–111)
CO2: 27 mmol/L (ref 22–32)
Calcium: 9.2 mg/dL (ref 8.9–10.3)
Creatinine, Ser: 0.93 mg/dL (ref 0.61–1.24)
GFR calc Af Amer: >60 mL/min (ref >60)
GFR calc non Af Amer: >60 mL/min (ref >60)
GLUCOSE: 139 mg/dL — AB (ref 65–99)
POTASSIUM: 4 mmol/L (ref 3.5–5.1)
Sodium: 141 mmol/L (ref 135–145)
Total Bilirubin: 0.6 mg/dL (ref 0.3–1.2)
Total Protein: 7.4 g/dL (ref 6.5–8.1)

## 2015-07-09 LAB — CBC WITH DIFFERENTIAL/PLATELET
BASOS ABS: 0 10*3/uL (ref 0–0.1)
Basophils Relative: 0 %
EOS ABS: 0.5 10*3/uL (ref 0–0.7)
Eosinophils Relative: 3 %
HEMATOCRIT: 50.2 % (ref 40.0–52.0)
Hemoglobin: 16.7 g/dL (ref 13.0–18.0)
LYMPHS ABS: 1.7 10*3/uL (ref 1.0–3.6)
LYMPHS PCT: 10 %
MCH: 29.6 pg (ref 26.0–34.0)
MCHC: 33.2 g/dL (ref 32.0–36.0)
MCV: 89.3 fL (ref 80.0–100.0)
Monocytes Absolute: 2.2 10*3/uL — ABNORMAL HIGH (ref 0.2–1.0)
Monocytes Relative: 13 %
NEUTROS ABS: 12.5 10*3/uL — AB (ref 1.4–6.5)
Neutrophils Relative %: 74 %
PLATELETS: 231 10*3/uL (ref 150–440)
RBC: 5.62 MIL/uL (ref 4.40–5.90)
RDW: 14.8 % — AB (ref 11.5–14.5)
WBC: 16.9 10*3/uL — ABNORMAL HIGH (ref 3.8–10.6)

## 2015-07-09 LAB — TROPONIN I
TROPONIN I: 0.14 ng/mL — AB (ref <0.031)
Troponin I: 0.03 ng/mL (ref <0.031)

## 2015-07-09 LAB — CKMB (ARMC ONLY): CK, MB: 5.6 ng/mL — ABNORMAL HIGH (ref 0.5–5.0)

## 2015-07-09 MED ORDER — ONDANSETRON HCL 4 MG/2ML IJ SOLN: 4.0000 mg | Freq: Four times a day (QID) | INTRAMUSCULAR | Status: DC | PRN

## 2015-07-09 MED ORDER — METOPROLOL TARTRATE 25 MG PO TABS
12.5000 mg | ORAL_TABLET | Freq: Two times a day (BID) | ORAL | Status: DC
  Administered 2015-07-09: 20:00:00 25 mg via ORAL
  Filled 2015-07-09: qty 1

## 2015-07-09 MED ORDER — SODIUM CHLORIDE 0.9 % IJ SOLN: 3.0000 mL | INTRAMUSCULAR | Status: DC | PRN

## 2015-07-09 MED ORDER — ACETAMINOPHEN 325 MG PO TABS: 650.0000 mg | ORAL_TABLET | Freq: Four times a day (QID) | ORAL | Status: DC | PRN

## 2015-07-09 MED ORDER — PREDNISONE 20 MG PO TABS: 60.0000 mg | ORAL_TABLET | Freq: Every day | ORAL | Status: DC

## 2015-07-09 MED ORDER — ACETAMINOPHEN 650 MG RE SUPP: 650.0000 mg | Freq: Four times a day (QID) | RECTAL | Status: DC | PRN

## 2015-07-09 MED ORDER — TIOTROPIUM BROMIDE MONOHYDRATE 18 MCG IN CAPS
18.0000 ug | ORAL_CAPSULE | Freq: Every day | RESPIRATORY_TRACT | Status: DC
  Administered 2015-07-09 – 2015-07-10 (×2): 18 ug via RESPIRATORY_TRACT
  Filled 2015-07-09: qty 5

## 2015-07-09 MED ORDER — LEVOFLOXACIN 500 MG PO TABS
500.0000 mg | ORAL_TABLET | Freq: Every day | ORAL | Status: DC
  Administered 2015-07-10: 500 mg via ORAL
  Filled 2015-07-09: qty 1

## 2015-07-09 MED ORDER — ONDANSETRON HCL 4 MG PO TABS: 4.0000 mg | ORAL_TABLET | Freq: Four times a day (QID) | ORAL | Status: DC | PRN

## 2015-07-09 MED ORDER — ALUM & MAG HYDROXIDE-SIMETH 200-200-20 MG/5ML PO SUSP
30.0000 mL | Freq: Four times a day (QID) | ORAL | Status: DC | PRN
  Administered 2015-07-09: 30 mL via ORAL
  Filled 2015-07-09: qty 30

## 2015-07-09 MED ORDER — PNEUMOCOCCAL VAC POLYVALENT 25 MCG/0.5ML IJ INJ: 0.5000 mL | INJECTION | INTRAMUSCULAR | Status: DC

## 2015-07-09 MED ORDER — ENOXAPARIN SODIUM 40 MG/0.4ML ~~LOC~~ SOLN
40.0000 mg | SUBCUTANEOUS | Status: DC
  Administered 2015-07-09: 40 mg via SUBCUTANEOUS
  Filled 2015-07-09: qty 0.4

## 2015-07-09 MED ORDER — LEVOFLOXACIN 750 MG PO TABS
750.0000 mg | ORAL_TABLET | Freq: Once | ORAL | Status: AC
  Administered 2015-07-09: 750 mg via ORAL
  Filled 2015-07-09: qty 1

## 2015-07-09 MED ORDER — ASPIRIN 325 MG PO TABS
324.0000 mg | ORAL_TABLET | ORAL | Status: AC
  Administered 2015-07-09: 325 mg via ORAL
  Filled 2015-07-09: qty 1

## 2015-07-09 MED ORDER — NITROGLYCERIN 0.4 MG SL SUBL: 0.4000 mg | SUBLINGUAL_TABLET | SUBLINGUAL | Status: DC | PRN

## 2015-07-09 MED ORDER — IPRATROPIUM-ALBUTEROL 0.5-2.5 (3) MG/3ML IN SOLN
3.0000 mL | Freq: Once | RESPIRATORY_TRACT | Status: AC
  Administered 2015-07-09: 3 mL via RESPIRATORY_TRACT
  Filled 2015-07-09: qty 3

## 2015-07-09 MED ORDER — ALBUTEROL SULFATE HFA 108 (90 BASE) MCG/ACT IN AERS: 2.0000 | INHALATION_SPRAY | Freq: Four times a day (QID) | RESPIRATORY_TRACT | Status: DC | PRN

## 2015-07-09 MED ORDER — ALBUTEROL SULFATE (2.5 MG/3ML) 0.083% IN NEBU
2.5000 mg | INHALATION_SOLUTION | Freq: Four times a day (QID) | RESPIRATORY_TRACT | Status: DC
  Administered 2015-07-09 – 2015-07-10 (×4): 2.5 mg via RESPIRATORY_TRACT
  Filled 2015-07-09 (×5): qty 3

## 2015-07-09 MED ORDER — MORPHINE SULFATE (PF) 2 MG/ML IV SOLN
2.0000 mg | INTRAVENOUS | Status: AC
  Administered 2015-07-09: 2 mg via INTRAVENOUS
  Filled 2015-07-09: qty 1

## 2015-07-09 MED ORDER — SODIUM CHLORIDE 0.9 % IJ SOLN
3.0000 mL | Freq: Two times a day (BID) | INTRAMUSCULAR | Status: DC
  Administered 2015-07-09 – 2015-07-10 (×3): 3 mL via INTRAVENOUS

## 2015-07-09 MED ORDER — ALBUTEROL SULFATE (2.5 MG/3ML) 0.083% IN NEBU
2.5000 mg | INHALATION_SOLUTION | Freq: Once | RESPIRATORY_TRACT | Status: AC
  Administered 2015-07-09: 2.5 mg via RESPIRATORY_TRACT
  Filled 2015-07-09: qty 3

## 2015-07-09 MED ORDER — METHYLPREDNISOLONE SODIUM SUCC 125 MG IJ SOLR
60.0000 mg | Freq: Two times a day (BID) | INTRAMUSCULAR | Status: DC
Start: 2015-07-09 — End: 2015-07-10
  Administered 2015-07-09 – 2015-07-10 (×3): 60 mg via INTRAVENOUS
  Filled 2015-07-09 (×3): qty 2

## 2015-07-09 MED ORDER — MOMETASONE FURO-FORMOTEROL FUM 100-5 MCG/ACT IN AERO
2.0000 | INHALATION_SPRAY | Freq: Two times a day (BID) | RESPIRATORY_TRACT | Status: DC
  Administered 2015-07-09 – 2015-07-10 (×3): 2 via RESPIRATORY_TRACT
  Filled 2015-07-09: qty 8.8

## 2015-07-09 MED ORDER — GUAIFENESIN ER 600 MG PO TB12
600.0000 mg | ORAL_TABLET | Freq: Two times a day (BID) | ORAL | Status: DC
  Administered 2015-07-09 – 2015-07-10 (×3): 600 mg via ORAL
  Filled 2015-07-09 (×3): qty 1

## 2015-07-09 MED ORDER — LEVOFLOXACIN 750 MG PO TABS: 750.0000 mg | ORAL_TABLET | Freq: Every day | ORAL | Status: DC

## 2015-07-09 NOTE — H&P (Signed)
[facility] [place] at [facility]   PATIENT NAME: Dennis Preston    MR#:  [id number]  DATE OF BIRTH:  November 02, 1950  DATE OF ADMISSION:  07/09/2015  PRIMARY CARE PHYSICIAN: No PCP Per Patient   REQUESTING/REFERRING PHYSICIAN: Dr Pershing Proud  CHIEF COMPLAINT:  Shortness of breath and cough  HISTORY OF PRESENT ILLNESS:  Dennis Preston  is a 64 y.o. male with a known history of hyperlipidemia, depression comes to the emergency room after he woke up at 4:00 in the morning with increased progressive increasing shortness of breath and dry hacking cough. Patient was seen in the emergency room received nebulizer treatment and was about to be discharged on prednisone taper and by mouth Levaquin along with inhalers. His sats dropped into the upper 80s. He was a intra-medicine was called for admission acute hypoxic respiratory failure secondary to COPD exacerbation and possible acute bronchitis.  PAST MEDICAL HISTORY:   Past Medical History  Diagnosis Date  . Hyperlipemia   . Depression     PAST SURGICAL HISTOIRY:  History reviewed. No pertinent past surgical history.  SOCIAL HISTORY:   Social History  Substance Use Topics  . Smoking status: Current Every Day Smoker -- 0.50 packs/day  . Smokeless tobacco: Not on file  . Alcohol Use: No    FAMILY HISTORY:   Family History  Problem Relation Age of Onset  . Depression Sister     DRUG ALLERGIES:  No Known Allergies  REVIEW OF SYSTEMS:  Review of Systems  Constitutional: Negative for fever, chills and diaphoresis.  HENT: Negative for congestion, ear pain, hearing loss, nosebleeds and sore throat.   Eyes: Negative for blurred vision, double vision, photophobia and pain.  Respiratory: Positive for cough and shortness of breath. Negative for hemoptysis, sputum production, wheezing and stridor.   Cardiovascular: Negative for orthopnea, claudication and leg swelling.  Gastrointestinal: Negative for heartburn  and abdominal pain.  Genitourinary: Negative for dysuria and frequency.  Musculoskeletal: Negative for back pain, joint pain and neck pain.  Skin: Negative for rash.  Neurological: Negative for tingling, sensory change, speech change, focal weakness, seizures and headaches.  Endo/Heme/Allergies: Does not bruise/bleed easily.  Psychiatric/Behavioral: Negative for suicidal ideas, memory loss and substance abuse. The patient is not nervous/anxious.   All other systems reviewed and are negative.    MEDICATIONS AT HOME:   Prior to Admission medications   Medication Sig Start Date End Date Taking? Authorizing Provider  albuterol (PROVENTIL HFA;VENTOLIN HFA) 108 (90 BASE) MCG/ACT inhaler Inhale 2 puffs into the lungs every 6 (six) hours as needed for wheezing or shortness of breath. 07/09/15   Gayla Doss, MD  levofloxacin (LEVAQUIN) 750 MG tablet Take 1 tablet (750 mg total) by mouth daily. 07/09/15   Gayla Doss, MD  predniSONE (DELTASONE) 20 MG tablet Take 3 tablets (60 mg total) by mouth daily. 07/09/15   Gayla Doss, MD      VITAL SIGNS:  Blood pressure 136/99, pulse 118, temperature 97.9 F (36.6 C), temperature source Oral, resp. rate 20, height  (1.727 m), weight 79.6 kg (175 lb 7.8 oz), SpO2 99 %.  PHYSICAL EXAMINATION:  GENERAL:  64 y.o.-year-old patient lying in the bed with no acute distress.  EYES: Pupils equal, round, reactive to light and accommodation. No scleral icterus. Extraocular muscles intact.  HEENT: Head atraumatic, normocephalic. Oropharynx and nasopharynx clear.  NECK:  Supple, no jugular venous distention. No thyroid enlargement, no tenderness.  LUNGS: Distant breath sounds bilaterally, no  wheezing, rales,rhonchi or crepitation. No use of accessory muscles of respiration.  CARDIOVASCULAR: S1, S2 normal. Tachycardia, No murmurs, rubs, or gallops.  ABDOMEN: Soft, nontender, nondistended. Bowel sounds present. No organomegaly or mass.  EXTREMITIES: No pedal  edema, cyanosis, or clubbing.  NEUROLOGIC: Cranial nerves II through XII are intact. Muscle strength 5/5 in all extremities. Sensation intact. Gait not checked.  PSYCHIATRIC: The patient is alert and oriented x 3.  SKIN: No obvious rash, lesion, or ulcer.   LABORATORY PANEL:   CBC  Recent Labs Lab 07/09/15 0528  WBC 16.9*  HGB 16.7  HCT 50.2  PLT 231   ------------------------------------------------------------------------------------------------------------------  Chemistries   Recent Labs Lab 07/09/15 0528  NA 141  K 4.0  CL 105  CO2 27  GLUCOSE 139*  BUN 14  CREATININE 0.93  CALCIUM 9.2  AST 22  ALT 13*  ALKPHOS 90  BILITOT 0.6   ------------------------------------------------------------------------------------------------------------------  Cardiac Enzymes  Recent Labs Lab 07/09/15 0528  TROPONINI 0.03   ------------------------------------------------------------------------------------------------------------------  RADIOLOGY:  Dg Chest 1 View  07/09/2015   CLINICAL DATA:  Patient woke at 4 a.m. with shortness of breath and respiratory distress. Wheezing. Abdominal retractions. Decreased breath sounds in the lower lobes.  EXAM: CHEST 1 VIEW  COMPARISON:  None.  FINDINGS: Normal heart size and pulmonary vascularity. No focal airspace disease or consolidation in the lungs. No blunting of costophrenic angles. No pneumothorax. Mediastinal contours appear intact. Old right rib fractures.  IMPRESSION: No active disease.   Electronically Signed   By: Burman Nieves M.D.   On: 07/09/2015 06:02    EKG:  S tachycardia  IMPRESSION AND PLAN:   64 year old Dennis Preston with past medical history of depression and hyperlipidemia comes to the emergency room with progressive increasing shortness of breath and cough. He was found to be hypoxic he is being admitted with  1. Acute hypoxic respiratory failure secondary to COPD exacerbation this is a new  diagnosis. Admitted to MedSurg floor. IV Solu-Medrol 60 twice a day. By mouth Levaquin daily. Nebulizer every 6 when necessary. We'll start patient on Dulera and  spiriva  2. Sinus tachycardia suspect due to #1  3. Tobacco abuse patient smokes about 2 packs every day. Smoking cessation advised 4 minutes spent patient did not seem too motivated.  4. History of depression Patient reports he just stable he does not take any medications.  5. DVT prophylaxis subcutaneous Lovenox  All the records are reviewed and case discussed with ED provider. Management plans discussed with the patient, family and they are in agreement.  CODE STATUS: Full TOTAL TIME TAKING CARE OF THIS PATIENT: 50 minutes.    Mahagony Grieb M.D on 07/09/2015 at 3:50 PM  Between 7am to 6pm - Pager - [phone]  After 6pm go to [email or web address] - password [email or web address] [facility]  [place] [facility]  Office  [phone]  CC: Primary care physician; No PCP Per Patient

## 2015-07-09 NOTE — ED Notes (Signed)
Pt received  of albuterol, 0.5mg  of atrovent,  iv solumedrol

## 2015-07-09 NOTE — ED Notes (Signed)
Pt with continued wheezing inspiratory and expiratory noted to upper lobes. Improved air movement noted to bilateral lower lobes. Family at bedside. Cont pox in place. Call bell at side. Pt continues to tachypnea, no retractions noted.

## 2015-07-09 NOTE — ED Notes (Signed)
Pt states awoke at 4 am with shob. Pt with wheezing noted inspiratory and expiratory in upper lobes. Pt with abd retractions noted. , decreased breath sounds noted also in lower lobes. Skin normal color warm and dry. Pt able to speak in short clipped speech.

## 2015-07-09 NOTE — Progress Notes (Signed)
Lab reported troponin at 0.14 with 1530 draw.  Dr. Enedina Finner notified, orders received. Casimer Leek, RN 07/09/2015 4:57 PM

## 2015-07-09 NOTE — ED Notes (Signed)
Pt resting quietly . Family is at the bedside.  SR noted on the monitor.

## 2015-07-09 NOTE — ED Notes (Signed)
Pt taken off O2, sats dropped to 88% on RA while at rest. Pt placed back on 2L .  MD notified, will continue to monitor the pt.

## 2015-07-09 NOTE — ED Notes (Signed)
Pt with improved air movement in all lung fields. Pt with improved wheezing noted. Family at bedside. Call bell at side. Warm blankets provided.

## 2015-07-09 NOTE — ED Provider Notes (Signed)
Signout to reevaluate the patient after neb treatments. Physical Exam  BP 120/62 mmHg  Pulse 75  Temp(Src) 98.2 F (36.8 C) (Oral)  Resp 25  Ht  (1.727 m)  Wt 175 lb 7.8 oz (79.6 kg)  BMI 26.69 kg/m2  SpO2 95% ----------------------------------------- 8:35 AM on 07/09/2015 -----------------------------------------   Physical Exam Patient resting comfortably on nasal cannula oxygen with sats are 96%. However, still rhonchorous throughout with a persistent expiratory cough. Desats to 88% on room air. ED Course  Procedures  MDM Patient will need to be admitted secondary to persistent rhonchi as well as hypoxia from a COPD exacerbation. I discussed this with the patient as well as the family understand the plan and are willing to comply.      Myrna Blazer, MD 07/09/15 [phone]

## 2015-07-09 NOTE — ED Provider Notes (Signed)
[facility] Emergency Department Provider Note  ____________________________________________  Time seen: Approximately 5:55 AM  I have reviewed the triage vital signs and the nursing notes.   HISTORY  Chief Complaint Shortness of Breath    HPI Dennis Preston is a 64 y.o. male with hyperlipidemia, COPD who presents for evaluation of 2-3 days worsening shortness of breath in the setting of non productive cough, sneezing, runny nose, nasal congestion, gradual onset, constant. No chest pain, no fevers or chills. SOB awoke him from sleep at approximately 4 AM suddenly, prompting him to call EMS. On their arrival he was in moderate respiratory distress with significant wheezing. He received albuterol and Atrovent nebs as well as 125 mg of Solu-Medrol with improvement of his symptoms. Current severity of symptoms is moderate. He reports that a "crazy doctor" diagnosed him with COPD a long time ago but he has refused to take any medications.   Past Medical History  Diagnosis Date  . Hyperlipemia   . Depression     Patient Active Problem List   Diagnosis Date Noted  . ANXIETY 02/24/2007  . ERECTILE DYSFUNCTION 02/24/2007  . LIVER FUNCTION TESTS, ABNORMAL 02/24/2007    History reviewed. No pertinent past surgical history.  Current Outpatient Rx  Name  Route  Sig  Dispense  Refill  . albuterol (PROVENTIL HFA;VENTOLIN HFA) 108 (90 BASE) MCG/ACT inhaler   Inhalation   Inhale 2 puffs into the lungs every 6 (six) hours as needed for wheezing or shortness of breath.   1 Inhaler   0   . levofloxacin (LEVAQUIN) 750 MG tablet   Oral   Take 1 tablet (750 mg total) by mouth daily.   5 tablet   0   . predniSONE (DELTASONE) 20 MG tablet   Oral   Take 3 tablets (60 mg total) by mouth daily.   15 tablet   0     Allergies Review of patient's allergies indicates no known allergies.  Family History  Problem Relation Age of Onset  . Depression Sister      Social History Social History  Substance Use Topics  . Smoking status: Current Every Day Smoker -- 0.50 packs/day  . Smokeless tobacco: None  . Alcohol Use: No    Review of Systems Constitutional: No fever/chills Eyes: No visual changes. ENT: No sore throat. Cardiovascular: Denies chest pain. Respiratory: + shortness of breath. Gastrointestinal: No abdominal pain.  No nausea, no vomiting.  No diarrhea.  No constipation. Genitourinary: Negative for dysuria. Musculoskeletal: Negative for back pain. Skin: Negative for rash. Neurological: Negative for headaches, focal weakness or numbness.  10-point ROS otherwise negative.  ____________________________________________   PHYSICAL EXAM:  VITAL SIGNS: ED Triage Vitals  Enc Vitals Group     BP 07/09/15 0519 184/97 mmHg     Pulse Rate 07/09/15 0519 130     Resp 07/09/15 0519 30     Temp 07/09/15 0519 98.2 F (36.8 C)     Temp Source 07/09/15 0519 Oral     SpO2 07/09/15 0519 100 %     Weight 07/09/15 0519 175 lb 7.8 oz (79.6 kg)     Height 07/09/15 0519  (1.727 m)     Head Cir --      Peak Flow --      Pain Score --      Pain Loc --      Pain Edu? --      Excl. in GC? --     Constitutional:  Alert and oriented. Nontoxic-appearing. In mild respiratory distress and able to speak in complete sentences. Eyes: Conjunctivae are normal. PERRL. EOMI. Head: Atraumatic. Nose: No congestion/rhinnorhea. Mouth/Throat: Mucous membranes are moist.  Oropharynx non-erythematous. Neck: No stridor.   Cardiovascular: tachycardic rate, regular rhythm. Grossly normal heart sounds.  Good peripheral circulation. Respiratory: Tachypnea with increased work of breathing. Diffuse expiratory wheeze. Gastrointestinal: Soft and nontender. No distention.  No CVA tenderness. Genitourinary: Deferred Musculoskeletal: No lower extremity tenderness nor edema.  No joint effusions. Neurologic:  Normal speech and language. No gross focal neurologic  deficits are appreciated. No gait instability. Skin:  Skin is warm, dry and intact. No rash noted. Psychiatric: Mood and affect are normal. Speech and behavior are normal.  ____________________________________________   LABS (all labs ordered are listed, but only abnormal results are displayed)  Labs Reviewed  CBC WITH DIFFERENTIAL/PLATELET - Abnormal; Notable for the following:    WBC 16.9 (*)    RDW 14.8 (*)    Neutro Abs 12.5 (*)    Monocytes Absolute 2.2 (*)    All other components within normal limits  COMPREHENSIVE METABOLIC PANEL - Abnormal; Notable for the following:    Glucose, Bld 139 (*)    ALT 13 (*)    All other components within normal limits  TROPONIN I   ____________________________________________  EKG  ED ECG REPORT I, Gayla Doss, the attending physician, personally viewed and interpreted this ECG.   Date: 07/09/2015  EKG Time: 05:22  Rate: 102  Rhythm: sinus tachycardia  Axis: normal  Intervals:none  ST&T Change: No acute ST elevation.  ____________________________________________  RADIOLOGY  Chest x-ray FINDINGS: Normal heart size and pulmonary vascularity. No focal airspace disease or consolidation in the lungs. No blunting of costophrenic angles. No pneumothorax. Mediastinal contours appear intact. Old right rib fractures.  IMPRESSION: No active disease. ____________________________________________   PROCEDURES  Procedure(s) performed: None  Critical Care performed: No  ____________________________________________   INITIAL IMPRESSION / ASSESSMENT AND PLAN / ED COURSE  Pertinent labs & imaging results that were available during my care of the patient were reviewed by me and considered in my medical decision making (see chart for details).  Dennis Preston is a 64 y.o. male with hyperlipidemia, COPD who presents for evaluation of 2-3 days worsening shortness of breath in the setting of non productive cough, sneezing, runny  nose, nasal congestion. On arrival to the emergency department, he has increased work of breathing with diffuse expiratory wheeze consistent with COPD exacerbation which was likely triggered by viral URI. He appears improved after an additional DuoNeb here. Labs reviewed. CBC notable for leukocytosis at 16.9 thousand. Normal CMP. Troponin negative. EKG not consistent with acute ischemia. Chest x-ray shows no acute cardio pulmonary process. Not consistent with ACS or PE. Levaquin ordered. Currently O2 saturations are 92%-94% on 2 L. We will attempt to wean oxygen however if he is hypoxic or continues to have increased work of breathing, anticipate admission. Care transferred to Dr. Langston Masker at 7 AM pending reassessment and final disposition. ____________________________________________   FINAL CLINICAL IMPRESSION(S) / ED DIAGNOSES  Final diagnoses:  COPD with exacerbation (HCC)      Gayla Doss, MD 07/09/15 [phone]

## 2015-07-09 NOTE — Progress Notes (Signed)
EKG performed per orders, revealed ST with Nonspecific ST abnormality, abnormal EKG, Dr. Enedina Finner notified, ordered repeat troponin in 8 hours.  Will monitor.Marland Kitchen

## 2015-07-09 NOTE — ED Notes (Signed)
Report to stepanie, rn.

## 2015-07-09 NOTE — Progress Notes (Addendum)
Pt c/o of CP 8/10.  Noted to be SOB and diaphoretic

## 2015-07-10 ENCOUNTER — Encounter: Payer: Self-pay | Admitting: Internal Medicine

## 2015-07-10 ENCOUNTER — Observation Stay
Admit: 2015-07-10 | Discharge: 2015-07-10 | Disposition: A | Discharge status: Home / Self Care | Payer: Medicare Other | Attending: Internal Medicine | Admitting: Internal Medicine

## 2015-07-10 ENCOUNTER — Encounter
Admission: EM | Disposition: A | Discharge status: Other Acute Inpatient Hospital | Payer: Self-pay | Source: Home / Self Care | Attending: Specialist

## 2015-07-10 DIAGNOSIS — F17200 Nicotine dependence, unspecified, uncomplicated: Secondary | ICD-10-CM | POA: Diagnosis present

## 2015-07-10 DIAGNOSIS — J9601 Acute respiratory failure with hypoxia: Secondary | ICD-10-CM | POA: Diagnosis present

## 2015-07-10 DIAGNOSIS — R000 Tachycardia, unspecified: Secondary | ICD-10-CM | POA: Diagnosis present

## 2015-07-10 DIAGNOSIS — J441 Chronic obstructive pulmonary disease with (acute) exacerbation: Secondary | ICD-10-CM | POA: Diagnosis present

## 2015-07-10 DIAGNOSIS — J440 Chronic obstructive pulmonary disease with acute lower respiratory infection: Secondary | ICD-10-CM | POA: Diagnosis present

## 2015-07-10 DIAGNOSIS — Z7952 Long term (current) use of systemic steroids: Secondary | ICD-10-CM | POA: Diagnosis not present

## 2015-07-10 DIAGNOSIS — Z818 Family history of other mental and behavioral disorders: Secondary | ICD-10-CM | POA: Diagnosis not present

## 2015-07-10 DIAGNOSIS — G453 Amaurosis fugax: Secondary | ICD-10-CM | POA: Insufficient documentation

## 2015-07-10 DIAGNOSIS — I214 Non-ST elevation (NSTEMI) myocardial infarction: Secondary | ICD-10-CM

## 2015-07-10 DIAGNOSIS — J209 Acute bronchitis, unspecified: Secondary | ICD-10-CM | POA: Diagnosis present

## 2015-07-10 DIAGNOSIS — E785 Hyperlipidemia, unspecified: Secondary | ICD-10-CM | POA: Diagnosis present

## 2015-07-10 DIAGNOSIS — F329 Major depressive disorder, single episode, unspecified: Secondary | ICD-10-CM | POA: Diagnosis present

## 2015-07-10 DIAGNOSIS — I2510 Atherosclerotic heart disease of native coronary artery without angina pectoris: Secondary | ICD-10-CM | POA: Insufficient documentation

## 2015-07-10 DIAGNOSIS — Z79899 Other long term (current) drug therapy: Secondary | ICD-10-CM | POA: Diagnosis not present

## 2015-07-10 HISTORY — PX: CARDIAC CATHETERIZATION: SHX172

## 2015-07-10 HISTORY — DX: Non-ST elevation (NSTEMI) myocardial infarction: I21.4

## 2015-07-10 LAB — TROPONIN I: Troponin I: 0.31 ng/mL — ABNORMAL HIGH (ref <0.031)

## 2015-07-10 SURGERY — RIGHT/LEFT HEART CATH AND CORONARY ANGIOGRAPHY
Anesthesia: Moderate Sedation

## 2015-07-10 MED ORDER — ASPIRIN EC 325 MG PO TBEC: 325.0000 mg | DELAYED_RELEASE_TABLET | Freq: Every day | ORAL | Status: DC

## 2015-07-10 MED ORDER — NITROGLYCERIN 0.4 MG SL SUBL: 0.4000 mg | SUBLINGUAL_TABLET | SUBLINGUAL | Status: DC | PRN

## 2015-07-10 MED ORDER — SODIUM CHLORIDE 0.9 % IV SOLN: 250.0000 mL | INTRAVENOUS | Status: DC | PRN

## 2015-07-10 MED ORDER — ASPIRIN 325 MG PO TBEC: 325.0000 mg | DELAYED_RELEASE_TABLET | Freq: Every day | ORAL | Status: DC

## 2015-07-10 MED ORDER — FENTANYL CITRATE (PF) 100 MCG/2ML IJ SOLN
INTRAMUSCULAR | Status: DC | PRN
Start: 2015-07-10 — End: 2015-07-10
  Administered 2015-07-10: 25 ug via INTRAVENOUS
  Administered 2015-07-10: 50 ug via INTRAVENOUS

## 2015-07-10 MED ORDER — FENTANYL CITRATE (PF) 100 MCG/2ML IJ SOLN
INTRAMUSCULAR | Status: AC
  Filled 2015-07-10: qty 2

## 2015-07-10 MED ORDER — ATORVASTATIN CALCIUM 80 MG PO TABS: 80.0000 mg | ORAL_TABLET | Freq: Every day | ORAL | Status: AC

## 2015-07-10 MED ORDER — MIDAZOLAM HCL 2 MG/2ML IJ SOLN
INTRAMUSCULAR | Status: DC | PRN
  Administered 2015-07-10: 0.5 mg via INTRAVENOUS
  Administered 2015-07-10: 1 mg via INTRAVENOUS

## 2015-07-10 MED ORDER — IOHEXOL 300 MG/ML  SOLN
INTRAMUSCULAR | Status: DC | PRN
  Administered 2015-07-10: 100 mL via INTRA_ARTERIAL
  Administered 2015-07-10: 30 mL via INTRA_ARTERIAL

## 2015-07-10 MED ORDER — ACETAMINOPHEN 325 MG PO TABS: 650.0000 mg | ORAL_TABLET | ORAL | Status: DC | PRN

## 2015-07-10 MED ORDER — ASPIRIN 81 MG PO CHEW: 81.0000 mg | CHEWABLE_TABLET | ORAL | Status: DC

## 2015-07-10 MED ORDER — SODIUM CHLORIDE 0.9 % WEIGHT BASED INFUSION: 3.0000 mL/kg/h | INTRAVENOUS | Status: DC

## 2015-07-10 MED ORDER — SODIUM CHLORIDE 0.9 % WEIGHT BASED INFUSION: 1.0000 mL/kg/h | INTRAVENOUS | Status: DC

## 2015-07-10 MED ORDER — MIDAZOLAM HCL 2 MG/2ML IJ SOLN
INTRAMUSCULAR | Status: AC
  Filled 2015-07-10: qty 2

## 2015-07-10 MED ORDER — ONDANSETRON HCL 4 MG/2ML IJ SOLN: 4.0000 mg | Freq: Four times a day (QID) | INTRAMUSCULAR | Status: DC | PRN

## 2015-07-10 MED ORDER — METOPROLOL TARTRATE 25 MG PO TABS: 12.5000 mg | ORAL_TABLET | Freq: Two times a day (BID) | ORAL | Status: DC

## 2015-07-10 MED ORDER — MOMETASONE FURO-FORMOTEROL FUM 100-5 MCG/ACT IN AERO: 2.0000 | INHALATION_SPRAY | Freq: Two times a day (BID) | RESPIRATORY_TRACT | Status: DC

## 2015-07-10 MED ORDER — SODIUM CHLORIDE 0.9 % IJ SOLN: 3.0000 mL | INTRAMUSCULAR | Status: DC | PRN

## 2015-07-10 MED ORDER — SODIUM CHLORIDE 0.9 % IJ SOLN: 3.0000 mL | Freq: Two times a day (BID) | INTRAMUSCULAR | Status: DC

## 2015-07-10 MED ORDER — ATORVASTATIN CALCIUM 20 MG PO TABS: 80.0000 mg | ORAL_TABLET | Freq: Every day | ORAL | Status: DC

## 2015-07-10 MED ORDER — METHYLPREDNISOLONE SODIUM SUCC 125 MG IJ SOLR: 60.0000 mg | Freq: Two times a day (BID) | INTRAMUSCULAR | Status: DC

## 2015-07-10 MED ORDER — HEPARIN (PORCINE) IN NACL 2-0.9 UNIT/ML-% IJ SOLN
INTRAMUSCULAR | Status: AC
  Filled 2015-07-10: qty 1000

## 2015-07-10 MED ORDER — SODIUM CHLORIDE 0.9 % IJ SOLN
3.0000 mL | Freq: Two times a day (BID) | INTRAMUSCULAR | Status: DC
  Administered 2015-07-10: 16:00:00 3 mL via INTRAVENOUS

## 2015-07-10 SURGICAL SUPPLY — 13 items
CATH INFINITI 5FR ANG PIGTAIL (CATHETERS) ×3 IMPLANT
CATH INFINITI 5FR JL4 (CATHETERS) ×3 IMPLANT
CATH INFINITI JR4 5F (CATHETERS) ×3 IMPLANT
CATH SWANZ 7F THERMO (CATHETERS) ×2 IMPLANT
DEVICE CLOSURE MYNXGRIP 5F (Vascular Products) ×2 IMPLANT
KIT MANI 3VAL PERCEP (MISCELLANEOUS) ×3 IMPLANT
KIT RIGHT HEART (MISCELLANEOUS) ×2 IMPLANT
NDL PERC 18GX7CM (NEEDLE) ×1 IMPLANT
NEEDLE PERC 18GX7CM (NEEDLE) ×3 IMPLANT
PACK CARDIAC CATH (CUSTOM PROCEDURE TRAY) ×3 IMPLANT
SHEATH AVANTI 5FR X 11CM (SHEATH) ×3 IMPLANT
SHEATH PINNACLE 7F 10CM (SHEATH) ×2 IMPLANT
WIRE EMERALD 3MM-J .035X150CM (WIRE) ×3 IMPLANT

## 2015-07-10 NOTE — Consult Note (Signed)
Reason for Consult: elevated troponin and chest pain shortness of breath Referring Physician:  Dr. Tomasita Morrow is an 64 y.o. male.  HPI:  Presents with shortness of breath dyspnea possible COPD symptoms she has also had  chest pain. Patient was treated with morphine for pain in his chest oxygen but his troponins came back slightly elevated. The patient has a history of smoking but no previous cardiac history has a history of hyperlipidemia but no car previous cardiac studies. Patient appears to be noncompliant but with this episode of dyspnea that brought into the hospital in with additional chest pain he now needs further cardiac evaluationl;Marland Kitchen Denies blackout spells of syncope no significant nausea no significant sputum production has not had any fever. Appears to have significant COPD symptoms because of smoking not a concern for additional cardiac issues needs to be evaluate  Past Medical History  Diagnosis Date  . Hyperlipemia   . Depression     History reviewed. No pertinent past surgical history.  Family History  Problem Relation Age of Onset  . Depression Sister     Social History:  reports that he has been smoking.  He does not have any smokeless tobacco history on file. He reports that he does not drink alcohol or use illicit drugs.  Allergies: No Known Allergies  Medications: I have reviewed the patient's current medications.  Results for orders placed or performed during the hospital encounter of 07/09/15 (from the past 48 hour(s))  CBC with Differential     Status: Abnormal   Collection Time: 07/09/15  5:28 AM  Result Value Ref Range   WBC 16.9 (H) 3.8 - 10.6 K/uL   RBC 5.62 4.40 - 5.90 MIL/uL   Hemoglobin 16.7 13.0 - 18.0 g/dL   HCT 50.2 40.0 - 52.0 %   MCV 89.3 80.0 - 100.0 fL   MCH 29.6 26.0 - 34.0 pg   MCHC 33.2 32.0 - 36.0 g/dL   RDW 14.8 (H) 11.5 - 14.5 %   Platelets 231 150 - 440 K/uL   Neutrophils Relative % 74 %   Lymphocytes Relative 10 %    Monocytes Relative 13 %   Eosinophils Relative 3 %   Basophils Relative 0 %   Neutro Abs 12.5 (H) 1.4 - 6.5 K/uL   Lymphs Abs 1.7 1.0 - 3.6 K/uL   Monocytes Absolute 2.2 (H) 0.2 - 1.0 K/uL   Eosinophils Absolute 0.5 0 - 0.7 K/uL   Basophils Absolute 0.0 0 - 0.1 K/uL  Comprehensive metabolic panel     Status: Abnormal   Collection Time: 07/09/15  5:28 AM  Result Value Ref Range   Sodium 141 135 - 145 mmol/L   Potassium 4.0 3.5 - 5.1 mmol/L   Chloride 105 101 - 111 mmol/L   CO2 27 22 - 32 mmol/L   Glucose, Bld 139 (H) 65 - 99 mg/dL   BUN 14 6 - 20 mg/dL   Creatinine, Ser 0.93 0.61 - 1.24 mg/dL   Calcium 9.2 8.9 - 10.3 mg/dL   Total Protein 7.4 6.5 - 8.1 g/dL   Albumin 4.4 3.5 - 5.0 g/dL   AST 22 15 - 41 U/L   ALT 13 (L) 17 - 63 U/L   Alkaline Phosphatase 90 38 - 126 U/L   Total Bilirubin 0.6 0.3 - 1.2 mg/dL   GFR calc non Af Amer >60 >60 mL/min   GFR calc Af Amer >60 >60 mL/min    Comment: (NOTE) The eGFR has  been calculated using the CKD EPI equation. This calculation has not been validated in all clinical situations. eGFR's persistently <60 mL/min signify possible Chronic Kidney Disease.    Anion gap 9 5 - 15  Troponin I     Status: None   Collection Time: 07/09/15  5:28 AM  Result Value Ref Range   Troponin I 0.03 <0.031 ng/mL    Comment:        NO INDICATION OF MYOCARDIAL INJURY.   Troponin I     Status: Abnormal   Collection Time: 07/09/15  3:52 PM  Result Value Ref Range   Troponin I 0.14 (H) <0.031 ng/mL    Comment: READ BACK AND VERIFIED WITH TRACEY WATSON @ 7494 ON 07/09/2015 BY [facility]        PERSISTENTLY INCREASED TROPONIN VALUES IN THE RANGE OF 0.04-0.49 ng/mL CAN BE SEEN IN:       -UNSTABLE ANGINA       -CONGESTIVE HEART FAILURE       -MYOCARDITIS       -CHEST TRAUMA       -ARRYHTHMIAS       -LATE PRESENTING MYOCARDIAL INFARCTION       -COPD   CLINICAL FOLLOW-UP RECOMMENDED.   [facility] only)     Status: Abnormal   Collection Time: 07/09/15  3:52  PM  Result Value Ref Range   CK, MB 5.6 (H) 0.5 - 5.0 ng/mL  Troponin I     Status: Abnormal   Collection Time: 07/09/15 11:20 PM  Result Value Ref Range   Troponin I 0.31 (H) <0.031 ng/mL    Comment: RESULTS PREVIOUSLY CALLED TO TRACY WATSON AT 1651 ON 07/09/15 BY [facility].Marland KitchenMarland KitchenGermanton        PERSISTENTLY INCREASED TROPONIN VALUES IN THE RANGE OF 0.04-0.49 ng/mL CAN BE SEEN IN:       -UNSTABLE ANGINA       -CONGESTIVE HEART FAILURE       -MYOCARDITIS       -CHEST TRAUMA       -ARRYHTHMIAS       -LATE PRESENTING MYOCARDIAL INFARCTION       -COPD   CLINICAL FOLLOW-UP RECOMMENDED.     Dg Chest 1 View  07/09/2015   CLINICAL DATA:  Patient woke at 4 a.m. with shortness of breath and respiratory distress. Wheezing. Abdominal retractions. Decreased breath sounds in the lower lobes.  EXAM: CHEST 1 VIEW  COMPARISON:  None.  FINDINGS: Normal heart size and pulmonary vascularity. No focal airspace disease or consolidation in the lungs. No blunting of costophrenic angles. No pneumothorax. Mediastinal contours appear intact. Old right rib fractures.  IMPRESSION: No active disease.   Electronically Signed   By: Lucienne Capers M.D.   On: 07/09/2015 06:02    Review of Systems  Constitutional: Positive for malaise/fatigue.  HENT: Positive for congestion.   Eyes: Positive for blurred vision.  Respiratory: Positive for cough, sputum production, shortness of breath and wheezing.   Cardiovascular: Positive for chest pain, palpitations, orthopnea and PND.  Gastrointestinal: Negative.   Genitourinary: Negative.   Musculoskeletal: Negative.   Skin: Negative.   Neurological: Positive for weakness.  Endo/Heme/Allergies: Negative.   Psychiatric/Behavioral: Positive for depression. The patient is nervous/anxious.    Blood pressure 130/64, pulse 75, temperature 97.6 F (36.4 C), temperature source Oral, resp. rate 18, height 5' 8" (1.727 m), weight 79.6 kg (175 lb 7.8 oz), SpO2 91 %. Physical Exam   Constitutional: He is oriented to person, place, and time. He appears well-developed  and well-nourished.  HENT:  Head: Normocephalic and atraumatic.  Eyes: Conjunctivae and EOM are normal. Pupils are equal, round, and reactive to light.  Neck: Normal range of motion. Neck supple.  Cardiovascular: Normal rate and regular rhythm.   Respiratory: He is in respiratory distress. He has wheezes.  GI: Soft. Bowel sounds are normal.  Musculoskeletal: Normal range of motion.  Neurological: He is alert and oriented to person, place, and time. He has normal reflexes.  Skin: Skin is warm and dry.  Psychiatric: He has a normal mood and affect. His behavior is normal.    Assessment/Plan: Elevated troponin  possible non STEMI  chest pain possible angina  shortness of breath  COPD exacerbation  acute bronchitis  hypoxemia  smoking . PLAN  agree with admission ruled out for myocardial infarction  follow-up troponins and EKGs  agree with echocardiogram for evaluation of shortness of breath and angina  agree with supplemental oxygen for hypoxemia  COPD exacerbation continue steroids antibiotics inhalers  agree with low-dose metoprolol hypertension possible angina  recommend lipid evaluation and possible statin therapy  advised patient refrain from tobacco abuse  if troponins increase will recommend cardiac catheterization   If troponin state relatively flat would consider Myoview   Caige Almeda D. 07/10/2015, 9:56 AM

## 2015-07-10 NOTE — Progress Notes (Addendum)
Report  Called to Clydie Braun RN at [employer] transported Patient to [facility] planning for open heart surgery.   VSS, family at bedside, patient alert and oriented x 4, no bleeding or drainage at right femoral site from cath.

## 2015-07-10 NOTE — Care Management (Signed)
Troponins 0.31. MI+ per cardiology. Scheduled for cardiac catherzation.  Will need to be transferred to [facility] for CABG. Accepting doctor is Dr. Vonita Moss.  Gwenette Greet RN MSN Care management [phone]

## 2015-07-10 NOTE — Plan of Care (Signed)
Problem: Discharge Progression Outcomes Goal: Other Discharge Outcomes/Goals Plan of care progress to goal: - No complaints of pain. - No respiratory distress noted this shift. - Continues IV solumedrol. - Continues breathing tx. - Wife at bedside. Will continue to monitor.

## 2015-07-10 NOTE — Progress Notes (Signed)
*  PRELIMINARY RESULTS* Echocardiogram 2D Echocardiogram has been performed.  Georgann Housekeeper Hege 07/10/2015, 8:26 AM

## 2015-07-10 NOTE — Discharge Summary (Signed)
[facility] - [place] at [facility]   PATIENT NAME: Dennis Preston    MR#:  [id number]  DATE OF BIRTH:  [birth year]  DATE OF ADMISSION:  07/09/2015 ADMITTING PHYSICIAN: Enedina Finner, MD  DATE OF DISCHARGE: 07/10/2015  PRIMARY CARE PHYSICIAN: No PCP Per Patient    ADMISSION DIAGNOSIS:  COPD with exacerbation (HCC) [J44.1]  DISCHARGE DIAGNOSIS:  Active Problems:   Hypoxia   NSTEMI (non-ST elevated myocardial infarction) (HCC)   SECONDARY DIAGNOSIS:   Past Medical History  Diagnosis Date  . Hyperlipemia   . Depression     HOSPITAL COURSE:   64 year old male with past medical history of tobacco abuse who presented to the hospital with shortness of breath and cough and noted to be in COPD exacerbation.  #1 COPD exacerbation-this was likely secondary to ongoing tobacco abuse, cocaine with underlying bronchitis. -Patient was being treated with IV steroids, around-the-clock neb treatments. Also started on IV Levaquin. -Patient has also been started on some Dulera and Spiriva. He has clinically improved and his wheezing and bronchospasm is improved.  #2 non-ST elevation MI-during the hospital course patient developed some chest pain and had cardiac markers checked which were positive. 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 Patient likely would benefit from coronary bypass surgery and therefore is being referred to [facility]. -Continue aspirin, beta blocker, nitroglycerin, statin. Patient is currently chest pain-free and hemodynamically stable.   DISCHARGE CONDITIONS:   Stable  CONSULTS OBTAINED:  Treatment Team:  Alwyn Pea, MD  DRUG ALLERGIES:  No Known Allergies  DISCHARGE MEDICATIONS:   Current Discharge Medication List    START taking these medications   Details  albuterol (PROVENTIL HFA;VENTOLIN HFA) 108 (90 BASE)  MCG/ACT inhaler Inhale 2 puffs into the lungs every 6 (six) hours as needed for wheezing or shortness of breath. Qty: 1 Inhaler, Refills: 0    aspirin EC 325 MG EC tablet Take 1 tablet (325 mg total) by mouth daily. Qty: 30 tablet, Refills: 0    atorvastatin (LIPITOR) 80 MG tablet Take 1 tablet (80 mg total) by mouth daily at 6 PM.    levofloxacin (LEVAQUIN) 750 MG tablet Take 1 tablet (750 mg total) by mouth daily. Qty: 5 tablet, Refills: 0    methylPREDNISolone sodium succinate (SOLU-MEDROL) 125 mg/2 mL injection Inject 0.96 mLs (60 mg total) into the vein every 12 (twelve) hours. Qty: 1 each, Refills: 0    metoprolol tartrate (LOPRESSOR) 25 MG tablet Take 0.5 tablets (12.5 mg total) by mouth 2 (two) times daily.    mometasone-formoterol (DULERA) 100-5 MCG/ACT AERO Inhale 2 puffs into the lungs 2 (two) times daily.    nitroGLYCERIN (NITROSTAT) 0.4 MG SL tablet Place 1 tablet (0.4 mg total) under the tongue every 5 (five) minutes as needed for chest pain. Refills: 12         DISCHARGE INSTRUCTIONS:   DIET:  Cardiac diet  DISCHARGE CONDITION:  Stable  ACTIVITY:  Activity as tolerated  OXYGEN:  Home Oxygen: No.   Oxygen Delivery: room air  DISCHARGE LOCATION:  home   If you experience worsening of your admission symptoms, develop shortness of breath, life threatening emergency, suicidal or homicidal thoughts you must seek medical attention immediately by calling 911 or calling your MD immediately  if symptoms less severe.  You Must read complete instructions/literature along with all the possible adverse reactions/side effects for all the Medicines you take and that have  been prescribed to you. Take any new Medicines after you have completely understood and accpet all the possible adverse reactions/side effects.   Please note  You were cared for by a hospitalist during your hospital stay. If you have any questions about your discharge medications or the care you  received while you were in the hospital after you are discharged, you can call the unit and asked to speak with the hospitalist on call if the hospitalist that took care of you is not available. Once you are discharged, your primary care physician will handle any further medical issues. Please note that NO REFILLS for any discharge medications will be authorized once you are discharged, as it is imperative that you return to your primary care physician (or establish a relationship with a primary care physician if you do not have one) for your aftercare needs so that they can reassess your need for medications and monitor your lab values.     Today   Patient denies any chest pain, shortness of breath, nausea, vomiting. Wheezing and shortness of breath is improved since yesterday.  VITAL SIGNS:  Blood pressure 98/67, pulse 54, temperature 97.9 F (36.6 C), temperature source Oral, resp. rate 18, height  (1.727 m), weight 79.6 kg (175 lb 7.8 oz), SpO2 97 %.  I/O:   Intake/Output Summary (Last 24 hours) at 07/10/15 1516 Last data filed at 07/10/15 0955  Gross per 24 hour  Intake    300 ml  Output    225 ml  Net     75 ml    PHYSICAL EXAMINATION:  GENERAL:  64 y.o.-year-old patient lying in the bed with no acute distress.  EYES: Pupils equal, round, reactive to light and accommodation. No scleral icterus. Extraocular muscles intact.  HEENT: Head atraumatic, normocephalic. Oropharynx and nasopharynx clear.  NECK:  Supple, no jugular venous distention. No thyroid enlargement, no tenderness.  LUNGS: Normal breath sounds bilaterally, no wheezing, rales,rhonchi. No use of accessory muscles of respiration.  CARDIOVASCULAR: S1, S2 normal. No murmurs, rubs, or gallops.  ABDOMEN: Soft, non-tender, non-distended. Bowel sounds present. No organomegaly or mass.  EXTREMITIES: No pedal edema, cyanosis, or clubbing.  NEUROLOGIC: Cranial nerves II through XII are intact. No focal motor or sensory  defecits b/l.  PSYCHIATRIC: The patient is alert and oriented x 3. Good affect.  SKIN: No obvious rash, lesion, or ulcer.   DATA REVIEW:   CBC  Recent Labs Lab 07/09/15 0528  WBC 16.9*  HGB 16.7  HCT 50.2  PLT 231    Chemistries   Recent Labs Lab 07/09/15 0528  NA 141  K 4.0  CL 105  CO2 27  GLUCOSE 139*  BUN 14  CREATININE 0.93  CALCIUM 9.2  AST 22  ALT 13*  ALKPHOS 90  BILITOT 0.6    Cardiac Enzymes  Recent Labs Lab 07/09/15 2320  TROPONINI 0.31*    Microbiology Results  No results found for this or any previous visit.  RADIOLOGY:  Dg Chest 1 View  07/09/2015   CLINICAL DATA:  Patient woke at 4 a.m. with shortness of breath and respiratory distress. Wheezing. Abdominal retractions. Decreased breath sounds in the lower lobes.  EXAM: CHEST 1 VIEW  COMPARISON:  None.  FINDINGS: Normal heart size and pulmonary vascularity. No focal airspace disease or consolidation in the lungs. No blunting of costophrenic angles. No pneumothorax. Mediastinal contours appear intact. Old right rib fractures.  IMPRESSION: No active disease.   Electronically Signed   By: Chrissie Noa  Andria Meuse M.D.   On: 07/09/2015 06:02      Management plans discussed with the patient, family and they are in agreement.  CODE STATUS:     Code Status Orders        Start     Ordered   07/10/15 1502  Full code   Continuous     07/10/15 1501      TOTAL TIME TAKING CARE OF THIS PATIENT: 40 minutes.    Houston Siren M.D on 07/10/2015 at 3:16 PM  Between 7am to 6pm - Pager - [phone]  After 6pm go to [email or web address] - password [email or web address] [facility]  [facility]  Office  [phone]  CC: Primary care physician; No PCP Per Patient

## 2015-07-11 DIAGNOSIS — H53121 Transient visual loss, right eye: Secondary | ICD-10-CM | POA: Insufficient documentation

## 2015-07-12 DIAGNOSIS — I6523 Occlusion and stenosis of bilateral carotid arteries: Secondary | ICD-10-CM | POA: Insufficient documentation

## 2015-07-13 DIAGNOSIS — Z951 Presence of aortocoronary bypass graft: Secondary | ICD-10-CM

## 2015-07-13 HISTORY — DX: Presence of aortocoronary bypass graft: Z95.1

## 2015-07-13 HISTORY — PX: CORONARY ARTERY BYPASS GRAFT: SHX141

## 2015-07-14 DIAGNOSIS — D72829 Elevated white blood cell count, unspecified: Secondary | ICD-10-CM | POA: Insufficient documentation

## 2015-07-14 DIAGNOSIS — I739 Peripheral vascular disease, unspecified: Secondary | ICD-10-CM | POA: Insufficient documentation

## 2015-07-14 DIAGNOSIS — I959 Hypotension, unspecified: Secondary | ICD-10-CM | POA: Insufficient documentation

## 2015-07-14 DIAGNOSIS — Z951 Presence of aortocoronary bypass graft: Secondary | ICD-10-CM | POA: Insufficient documentation

## 2015-07-14 HISTORY — DX: Hypotension, unspecified: I95.9

## 2015-07-16 NOTE — Progress Notes (Signed)
Subjective:   status post cardiac catheterization 2 shortness of breath found have multivessel disease denies any further chest pain still short of breath resting comfortably  Objective:  Vital Signs in the last 24 hours:    Intake/Output from previous day:   Intake/Output from this shift:    Physical Exam: General appearance: cooperative and appears stated age Neck: no adenopathy, no carotid bruit, no JVD, supple, symmetrical, trachea midline and thyroid not enlarged, symmetric, no tenderness/mass/nodules Lungs: clear to auscultation bilaterally Heart: regular rate and rhythm, S1, S2 normal, no murmur, click, rub or gallop Abdomen: soft, non-tender; bowel sounds normal; no masses,  no organomegaly Extremities: extremities normal, atraumatic, no cyanosis or edema Pulses: 2+ and symmetric Skin: Skin color, texture, turgor normal. No rashes or lesions Neurologic: Alert and oriented X 3, normal strength and tone. Normal symmetric reflexes. Normal coordination and gait  Lab Results: No results for input(s): WBC, HGB, PLT in the last 72 hours. No results for input(s): NA, K, CL, CO2, GLUCOSE, BUN, CREATININE in the last 72 hours. No results for input(s): TROPONINI in the last 72 hours.  Invalid input(s): CK, MB Hepatic Function Panel No results for input(s): PROT, ALBUMIN, AST, ALT, ALKPHOS, BILITOT, BILIDIR, IBILI in the last 72 hours. No results for input(s): CHOL in the last 72 hours. No results for input(s): PROTIME in the last 72 hours.  Imaging: Imaging results have been reviewed  Cardiac Studies:  Assessment/Plan:  Angina Chest Pain Coronary Artery Disease Ischemic Heart Disease MI Shortness of Breath   non STEMI  elevated troponin  COPD  shortness of breath  multivessel coronary disease on cardiac catheterization  smoking . PLAN  status post cardiac catheterization with multivessel coronary disease including left main  preserved left ventricular function  recommend transfer the patient to [facility] for coronary bypass surgery  COPD related to smoking advise patient to continue inhalers and quit smoking  recommend lipid management  aspirin therapy for arteriosclerotic vascular disease  have the patient follow-up with Cardiology after coronary bypass surgery  LOS: 0 days    CALLWOOD,DWAYNE D. 07/16/2015, 1:00 PM

## 2015-07-17 DIAGNOSIS — D689 Coagulation defect, unspecified: Secondary | ICD-10-CM | POA: Insufficient documentation

## 2015-07-18 DIAGNOSIS — I480 Paroxysmal atrial fibrillation: Secondary | ICD-10-CM | POA: Insufficient documentation

## 2015-07-18 DIAGNOSIS — I4891 Unspecified atrial fibrillation: Secondary | ICD-10-CM | POA: Insufficient documentation

## 2016-03-06 ENCOUNTER — Other Ambulatory Visit: Payer: Self-pay | Admitting: Vascular Surgery

## 2016-03-06 DIAGNOSIS — I6523 Occlusion and stenosis of bilateral carotid arteries: Secondary | ICD-10-CM

## 2016-03-13 ENCOUNTER — Ambulatory Visit
Admission: RE | Admit: 2016-03-13 | Discharge: 2016-03-13 | Disposition: A | Discharge status: Home / Self Care | Payer: BC Managed Care – PPO | Source: Ambulatory Visit | Attending: Vascular Surgery | Admitting: Vascular Surgery

## 2016-03-13 DIAGNOSIS — I6521 Occlusion and stenosis of right carotid artery: Secondary | ICD-10-CM | POA: Diagnosis not present

## 2016-03-13 DIAGNOSIS — I771 Stricture of artery: Secondary | ICD-10-CM | POA: Insufficient documentation

## 2016-03-13 DIAGNOSIS — I6523 Occlusion and stenosis of bilateral carotid arteries: Secondary | ICD-10-CM | POA: Insufficient documentation

## 2016-03-13 LAB — POCT I-STAT CREATININE: CREATININE: 1 mg/dL (ref 0.61–1.24)

## 2016-03-13 MED ORDER — IOPAMIDOL (ISOVUE-370) INJECTION 76%
75.0000 mL | Freq: Once | INTRAVENOUS | Status: AC | PRN
  Administered 2016-03-13: 75 mL via INTRAVENOUS

## 2016-10-01 ENCOUNTER — Other Ambulatory Visit (INDEPENDENT_AMBULATORY_CARE_PROVIDER_SITE_OTHER): Payer: Self-pay | Admitting: Vascular Surgery

## 2016-10-01 DIAGNOSIS — I6529 Occlusion and stenosis of unspecified carotid artery: Secondary | ICD-10-CM

## 2016-10-02 ENCOUNTER — Encounter (INDEPENDENT_AMBULATORY_CARE_PROVIDER_SITE_OTHER): Payer: Medicare Other

## 2016-10-02 ENCOUNTER — Ambulatory Visit (INDEPENDENT_AMBULATORY_CARE_PROVIDER_SITE_OTHER): Payer: Medicare Other | Admitting: Vascular Surgery

## 2017-01-14 DIAGNOSIS — Z720 Tobacco use: Secondary | ICD-10-CM | POA: Insufficient documentation

## 2018-06-12 ENCOUNTER — Inpatient Hospital Stay
Admission: EM | Admit: 2018-06-12 | Discharge: 2018-06-14 | DRG: 190 | Disposition: A | Discharge status: Home / Self Care | Payer: Medicare Other | Attending: Internal Medicine | Admitting: Internal Medicine

## 2018-06-12 ENCOUNTER — Other Ambulatory Visit: Payer: Self-pay

## 2018-06-12 ENCOUNTER — Encounter: Payer: Self-pay | Admitting: Emergency Medicine

## 2018-06-12 ENCOUNTER — Emergency Department: Payer: Medicare Other

## 2018-06-12 DIAGNOSIS — I4891 Unspecified atrial fibrillation: Secondary | ICD-10-CM | POA: Diagnosis present

## 2018-06-12 DIAGNOSIS — Z716 Tobacco abuse counseling: Secondary | ICD-10-CM

## 2018-06-12 DIAGNOSIS — I739 Peripheral vascular disease, unspecified: Secondary | ICD-10-CM | POA: Diagnosis present

## 2018-06-12 DIAGNOSIS — I2510 Atherosclerotic heart disease of native coronary artery without angina pectoris: Secondary | ICD-10-CM | POA: Diagnosis present

## 2018-06-12 DIAGNOSIS — F329 Major depressive disorder, single episode, unspecified: Secondary | ICD-10-CM | POA: Diagnosis present

## 2018-06-12 DIAGNOSIS — Z7982 Long term (current) use of aspirin: Secondary | ICD-10-CM

## 2018-06-12 DIAGNOSIS — I252 Old myocardial infarction: Secondary | ICD-10-CM

## 2018-06-12 DIAGNOSIS — I10 Essential (primary) hypertension: Secondary | ICD-10-CM | POA: Diagnosis present

## 2018-06-12 DIAGNOSIS — J441 Chronic obstructive pulmonary disease with (acute) exacerbation: Principal | ICD-10-CM

## 2018-06-12 DIAGNOSIS — Z792 Long term (current) use of antibiotics: Secondary | ICD-10-CM

## 2018-06-12 DIAGNOSIS — Z951 Presence of aortocoronary bypass graft: Secondary | ICD-10-CM

## 2018-06-12 DIAGNOSIS — Z79899 Other long term (current) drug therapy: Secondary | ICD-10-CM

## 2018-06-12 DIAGNOSIS — Z818 Family history of other mental and behavioral disorders: Secondary | ICD-10-CM

## 2018-06-12 DIAGNOSIS — E785 Hyperlipidemia, unspecified: Secondary | ICD-10-CM | POA: Diagnosis present

## 2018-06-12 DIAGNOSIS — J209 Acute bronchitis, unspecified: Secondary | ICD-10-CM | POA: Diagnosis present

## 2018-06-12 DIAGNOSIS — J440 Chronic obstructive pulmonary disease with acute lower respiratory infection: Secondary | ICD-10-CM | POA: Diagnosis present

## 2018-06-12 DIAGNOSIS — J9601 Acute respiratory failure with hypoxia: Secondary | ICD-10-CM

## 2018-06-12 DIAGNOSIS — F17210 Nicotine dependence, cigarettes, uncomplicated: Secondary | ICD-10-CM | POA: Diagnosis present

## 2018-06-12 DIAGNOSIS — Z7902 Long term (current) use of antithrombotics/antiplatelets: Secondary | ICD-10-CM

## 2018-06-12 HISTORY — DX: Other specified diseases of liver: K76.89

## 2018-06-12 HISTORY — DX: Acute myocardial infarction, unspecified: I21.9

## 2018-06-12 HISTORY — DX: Chronic obstructive pulmonary disease, unspecified: J44.9

## 2018-06-12 HISTORY — DX: Elevated white blood cell count, unspecified: D72.829

## 2018-06-12 HISTORY — DX: Unspecified atrial fibrillation: I48.91

## 2018-06-12 HISTORY — DX: Amaurosis fugax: G45.3

## 2018-06-12 HISTORY — DX: Tobacco use: Z72.0

## 2018-06-12 HISTORY — DX: Hypotension, unspecified: I95.9

## 2018-06-12 HISTORY — DX: Peripheral vascular disease, unspecified: I73.9

## 2018-06-12 HISTORY — DX: Coagulation defect, unspecified: D68.9

## 2018-06-12 HISTORY — DX: Atherosclerotic heart disease of native coronary artery without angina pectoris: I25.10

## 2018-06-12 HISTORY — DX: Alcohol abuse, uncomplicated: F10.10

## 2018-06-12 HISTORY — DX: Occlusion and stenosis of unspecified carotid artery: I65.29

## 2018-06-12 LAB — BASIC METABOLIC PANEL
Anion gap: 8 (ref 5–15)
BUN: 15 mg/dL (ref 8–23)
CO2: 28 mmol/L (ref 22–32)
Calcium: 8.9 mg/dL (ref 8.9–10.3)
Chloride: 107 mmol/L (ref 98–111)
Creatinine, Ser: 0.87 mg/dL (ref 0.61–1.24)
GFR calc Af Amer: >60 mL/min (ref >60)
GFR calc non Af Amer: >60 mL/min (ref >60)
Glucose, Bld: 84 mg/dL (ref 70–99)
Potassium: 4.2 mmol/L (ref 3.5–5.1)
Sodium: 143 mmol/L (ref 135–145)

## 2018-06-12 LAB — CBC
HCT: 47.2 % (ref 40.0–52.0)
Hemoglobin: 16.1 g/dL (ref 13.0–18.0)
MCH: 30.9 pg (ref 26.0–34.0)
MCHC: 34.1 g/dL (ref 32.0–36.0)
MCV: 90.7 fL (ref 80.0–100.0)
PLATELETS: 205 10*3/uL (ref 150–440)
RBC: 5.2 MIL/uL (ref 4.40–5.90)
RDW: 15.3 % — AB (ref 11.5–14.5)
WBC: 10.9 10*3/uL — AB (ref 3.8–10.6)

## 2018-06-12 LAB — TROPONIN I

## 2018-06-12 MED ORDER — IPRATROPIUM-ALBUTEROL 0.5-2.5 (3) MG/3ML IN SOLN
3.0000 mL | Freq: Once | RESPIRATORY_TRACT | Status: AC
  Administered 2018-06-12: 3 mL via RESPIRATORY_TRACT
  Filled 2018-06-12: qty 3

## 2018-06-12 MED ORDER — METHYLPREDNISOLONE SODIUM SUCC 125 MG IJ SOLR
125.0000 mg | Freq: Once | INTRAMUSCULAR | Status: AC
  Administered 2018-06-12: 125 mg via INTRAVENOUS
  Filled 2018-06-12: qty 2

## 2018-06-12 NOTE — ED Triage Notes (Addendum)
[facility]S [place] [facility] after stopping there with sudden onset of shortness of breath;  pt had been out shopping with wife when symptoms started; pt denies chest pain and diaphoresis; pt with history of COPD; has recently started smoking again; also history of MI with CABG 3 years ago; no meds given by EMS pta; pt arrives awake and alert

## 2018-06-12 NOTE — ED Notes (Signed)
Patient transported to X-ray 

## 2018-06-12 NOTE — ED Provider Notes (Signed)
[facility] Emergency Department Provider Note  ____________________________________________  Time seen: Approximately 9:56 PM  I have reviewed the triage vital signs and the nursing notes.   HISTORY  Chief Complaint Shortness of Breath   HPI Dennis Preston is a 67 y.o. male with a history of COPD, smoking, A. fib, CAD, alcohol abuse, hypertension, peripheral artery disease who presents for evaluation of shortness of breath.  Patient reports constant mild shortness of breath which has been getting worse for the last few days.  Today he was sitting at a shop when the shortness of breath became severe.  He denies chest pain.  He reports that he spent several hours mowing the lawn today and it was very hot and humid outside.  He has had no changes in his chronic cough and brings up white sputum, no fever or chills, no hemoptysis.  No personal family history of blood clots, recent travel immobilization, leg pain or swelling, or exogenous hormones.  He has not seen pulmonologist since 2017.  He does not have an inhaler at home.  Past Medical History:  Diagnosis Date  . Alcohol abuse   . Amaurosis fugax   . Atrial fibrillation (HCC)   . Carotid stenosis   . Coagulopathy (HCC)   . COPD (chronic obstructive pulmonary disease) (HCC)   . Coronary artery disease   . Depression   . Hyperlipemia   . Hypotension   . Leucocytosis   . Liver dysfunction   . Myocardial infarction (HCC)   . Peripheral artery disease (HCC)   . Tobacco abuse     Patient Active Problem List   Diagnosis Date Noted  . NSTEMI (non-ST elevated myocardial infarction) (HCC) 07/10/2015  . Hypoxia 07/09/2015  . ANXIETY 02/24/2007  . ERECTILE DYSFUNCTION 02/24/2007  . LIVER FUNCTION TESTS, ABNORMAL 02/24/2007    Past Surgical History:  Procedure Laterality Date  . CARDIAC CATHETERIZATION N/A 07/10/2015   Procedure: Right/Left Heart Cath and Coronary Angiography;  Surgeon: Alwyn Pea, MD;  Location: [facility] INVASIVE CV LAB;  Service: Cardiovascular;  Laterality: N/A;  . CORONARY ARTERY BYPASS GRAFT      Prior to Admission medications   Medication Sig Start Date End Date Taking? Authorizing Provider  albuterol (PROVENTIL HFA;VENTOLIN HFA) 108 (90 BASE) MCG/ACT inhaler Inhale 2 puffs into the lungs every 6 (six) hours as needed for wheezing or shortness of breath. 07/09/15   Gayla Doss, MD  aspirin EC 325 MG EC tablet Take 1 tablet (325 mg total) by mouth daily. 07/11/15   Houston Siren, MD  atorvastatin (LIPITOR) 80 MG tablet Take 1 tablet (80 mg total) by mouth daily at 6 PM. 07/10/15   Sainani, Rolly Pancake, MD  levofloxacin (LEVAQUIN) 750 MG tablet Take 1 tablet (750 mg total) by mouth daily. 07/09/15   Gayla Doss, MD  methylPREDNISolone sodium succinate (SOLU-MEDROL) 125 mg/2 mL injection Inject 0.96 mLs (60 mg total) into the vein every 12 (twelve) hours. 07/10/15   Houston Siren, MD  metoprolol tartrate (LOPRESSOR) 25 MG tablet Take 0.5 tablets (12.5 mg total) by mouth 2 (two) times daily. 07/10/15   Houston Siren, MD  mometasone-formoterol (DULERA) 100-5 MCG/ACT AERO Inhale 2 puffs into the lungs 2 (two) times daily. 07/10/15   Houston Siren, MD  nitroGLYCERIN (NITROSTAT) 0.4 MG SL tablet Place 1 tablet (0.4 mg total) under the tongue every 5 (five) minutes as needed for chest pain. 07/10/15   Houston Siren, MD  Allergies Patient has no known allergies.  Family History  Problem Relation Age of Onset  . Depression Sister     Social History Social History   Tobacco Use  . Smoking status: Current Every Day Smoker    Packs/day: 0.50    Types: Cigarettes  . Smokeless tobacco: Never Used  Substance Use Topics  . Alcohol use: Not Currently  . Drug use: Never    Review of Systems  Constitutional: Negative for fever. Eyes: Negative for visual changes. ENT: Negative for sore throat. Neck: No neck pain  Cardiovascular: Negative for  chest pain. Respiratory: + shortness of breath, cough Gastrointestinal: Negative for abdominal pain, vomiting or diarrhea. Genitourinary: Negative for dysuria. Musculoskeletal: Negative for back pain. Skin: Negative for rash. Neurological: Negative for headaches, weakness or numbness. Psych: No SI or HI  ____________________________________________   PHYSICAL EXAM:  VITAL SIGNS: ED Triage Vitals  Enc Vitals Group     BP 06/12/18 2045 (!) 169/75     Pulse Rate 06/12/18 2045 70     Resp 06/12/18 2045 (!) 24     Temp 06/12/18 2046 97.9 F (36.6 C)     Temp Source 06/12/18 2046 Oral     SpO2 06/12/18 2042 94 %     Weight 06/12/18 2047 170 lb (77.1 kg)     Height 06/12/18 2047 5' 6.5" (1.689 m)     Head Circumference --      Peak Flow --      Pain Score 06/12/18 2047 0     Pain Loc --      Pain Edu? --      Excl. in GC? --     Constitutional: Alert and oriented. Well appearing and in no apparent distress. HEENT:      Head: Normocephalic and atraumatic.         Eyes: Conjunctivae are normal. Sclera is non-icteric.       Mouth/Throat: Mucous membranes are moist.       Neck: Supple with no signs of meningismus. Cardiovascular: Regular rate and rhythm. No murmurs, gallops, or rubs. 2+ symmetrical distal pulses are present in all extremities. No JVD. Respiratory: Mild increased work of breathing, no hypoxia, respiratory rate of 24, decreased air movement bilaterally with very tight expiratory wheezes  Gastrointestinal: Soft, non tender, and non distended with positive bowel sounds. No rebound or guarding. Musculoskeletal: Nontender with normal range of motion in all extremities. No edema, cyanosis, or erythema of extremities. Neurologic: Normal speech and language. Face is symmetric. Moving all extremities. No gross focal neurologic deficits are appreciated. Skin: Skin is warm, dry and intact. No rash noted. Psychiatric: Mood and affect are normal. Speech and behavior are  normal.  ____________________________________________   LABS (all labs ordered are listed, but only abnormal results are displayed)  Labs Reviewed  CBC - Abnormal; Notable for the following components:      Result Value   WBC 10.9 (*)    RDW 15.3 (*)    All other components within normal limits  BASIC METABOLIC PANEL  TROPONIN I  TROPONIN I   ____________________________________________  EKG  ED ECG REPORT I, Nita Sicklearolina Fortunata Betty, the attending physician, personally viewed and interpreted this ECG.  Normal sinus rhythm, rate of 66, normal intervals, right axis deviation, no ST elevations or depressions. ____________________________________________  RADIOLOGY  I have personally reviewed the images performed during this visit and I agree with the Radiologist's read.   Interpretation by Radiologist:  Dg Chest 2 View  Result Date:  06/12/2018 CLINICAL DATA:  Shortness of breath. COPD. Prior myocardial infarction. EXAM: CHEST - 2 VIEW COMPARISON:  07/09/2015 FINDINGS: Mild hyperinflation. Prior median sternotomy. Remote right rib trauma. Midline trachea. Normal heart size. Atherosclerosis in the transverse aorta. No pleural effusion or pneumothorax. Diffuse peribronchial thickening. No lobar consolidation. Suspect scarring along the left heart border, similar. IMPRESSION: No acute cardiopulmonary disease. Hyperinflation and interstitial thickening, suggesting COPD/chronic bronchitis. Aortic Atherosclerosis (ICD10-I70.0). Electronically Signed   By: Jeronimo Greaves M.D.   On: 06/12/2018 21:29     ____________________________________________   PROCEDURES  Procedure(s) performed: None Procedures Critical Care performed:  Yes  CRITICAL CARE Performed by: Nita Sickle  ?  Total critical care time: 35 min  Critical care time was exclusive of separately billable procedures and treating other patients.  Critical care was necessary to treat or prevent imminent or  life-threatening deterioration.  Critical care was time spent personally by me on the following activities: development of treatment plan with patient and/or surrogate as well as nursing, discussions with consultants, evaluation of patient's response to treatment, examination of patient, obtaining history from patient or surrogate, ordering and performing treatments and interventions, ordering and review of laboratory studies, ordering and review of radiographic studies, pulse oximetry and re-evaluation of patient's condition.  ____________________________________________   INITIAL IMPRESSION / ASSESSMENT AND PLAN / ED COURSE   67 y.o. male with a history of COPD, smoking, A. fib, CAD, alcohol abuse, hypertension, peripheral artery disease who presents for evaluation of shortness of breath.  Patient arrives in moderate respiratory distress, tachypnea, severely diminished air movement and tight expiratory wheezes, no hypoxia.  EKG with no ischemic changes.  Symptoms have been ongoing and became severe today in the setting of continued to smoke and not having inhalers at home.  Presentation concerning for COPD exacerbation.  Chest x-ray shows no evidence of pneumonia or pneumothorax.  Low suspicion for ACS with a normal EKG.  First troponin is negative we will get a second 1 in 3 hours.  At this time low suspicion for PE.  Will give 3 DuoNeb's and Solu-Medrol and reassess.    _________________________ 11:46 PM on 06/12/2018 -----------------------------------------  After 3 DuoNeb's patient is now moving better air but still wheezing and now with a new oxygen requirement of 3 L nasal cannula.  Patient with a low white count of 10.9 but no fever, will hold abx. Will consult hospitalist for admission   As part of my medical decision making, I reviewed the following data within the electronic MEDICAL RECORD NUMBER Nursing notes reviewed and incorporated, Labs reviewed , EKG interpreted , Old EKG reviewed,  Old chart reviewed, Radiograph reviewed , Discussed with admitting physician , Notes from prior ED visits and [place] Controlled Substance Database    Pertinent labs & imaging results that were available during my care of the patient were reviewed by me and considered in my medical decision making (see chart for details).    ____________________________________________   FINAL CLINICAL IMPRESSION(S) / ED DIAGNOSES  Final diagnoses:  Acute respiratory failure with hypoxia (HCC)  COPD exacerbation (HCC)      NEW MEDICATIONS STARTED DURING THIS VISIT:  ED Discharge Orders    None       Note:  This document was prepared using Dragon voice recognition software and may include unintentional dictation errors.    Nita Sickle, MD 06/12/18 [phone]

## 2018-06-12 NOTE — ED Notes (Signed)
Breathing treatment complete; side rails up with call bell in reach; wife at bedside; no requests or complaints at this time

## 2018-06-13 ENCOUNTER — Inpatient Hospital Stay
Admit: 2018-06-13 | Discharge: 2018-06-13 | Disposition: A | Discharge status: Home / Self Care | Payer: Medicare Other | Attending: Internal Medicine | Admitting: Internal Medicine

## 2018-06-13 ENCOUNTER — Other Ambulatory Visit: Payer: Self-pay

## 2018-06-13 DIAGNOSIS — Z79899 Other long term (current) drug therapy: Secondary | ICD-10-CM | POA: Diagnosis not present

## 2018-06-13 DIAGNOSIS — I10 Essential (primary) hypertension: Secondary | ICD-10-CM | POA: Diagnosis present

## 2018-06-13 DIAGNOSIS — I4891 Unspecified atrial fibrillation: Secondary | ICD-10-CM | POA: Diagnosis present

## 2018-06-13 DIAGNOSIS — I252 Old myocardial infarction: Secondary | ICD-10-CM | POA: Diagnosis not present

## 2018-06-13 DIAGNOSIS — Z716 Tobacco abuse counseling: Secondary | ICD-10-CM | POA: Diagnosis not present

## 2018-06-13 DIAGNOSIS — I739 Peripheral vascular disease, unspecified: Secondary | ICD-10-CM | POA: Diagnosis present

## 2018-06-13 DIAGNOSIS — Z818 Family history of other mental and behavioral disorders: Secondary | ICD-10-CM | POA: Diagnosis not present

## 2018-06-13 DIAGNOSIS — F329 Major depressive disorder, single episode, unspecified: Secondary | ICD-10-CM | POA: Diagnosis present

## 2018-06-13 DIAGNOSIS — Z7982 Long term (current) use of aspirin: Secondary | ICD-10-CM | POA: Diagnosis not present

## 2018-06-13 DIAGNOSIS — Z7902 Long term (current) use of antithrombotics/antiplatelets: Secondary | ICD-10-CM | POA: Diagnosis not present

## 2018-06-13 DIAGNOSIS — F17210 Nicotine dependence, cigarettes, uncomplicated: Secondary | ICD-10-CM | POA: Diagnosis present

## 2018-06-13 DIAGNOSIS — Z792 Long term (current) use of antibiotics: Secondary | ICD-10-CM | POA: Diagnosis not present

## 2018-06-13 DIAGNOSIS — E785 Hyperlipidemia, unspecified: Secondary | ICD-10-CM | POA: Diagnosis present

## 2018-06-13 DIAGNOSIS — J440 Chronic obstructive pulmonary disease with acute lower respiratory infection: Secondary | ICD-10-CM | POA: Diagnosis present

## 2018-06-13 DIAGNOSIS — I2510 Atherosclerotic heart disease of native coronary artery without angina pectoris: Secondary | ICD-10-CM | POA: Diagnosis present

## 2018-06-13 DIAGNOSIS — J9601 Acute respiratory failure with hypoxia: Secondary | ICD-10-CM | POA: Diagnosis present

## 2018-06-13 DIAGNOSIS — J441 Chronic obstructive pulmonary disease with (acute) exacerbation: Secondary | ICD-10-CM | POA: Diagnosis present

## 2018-06-13 DIAGNOSIS — J209 Acute bronchitis, unspecified: Secondary | ICD-10-CM | POA: Diagnosis present

## 2018-06-13 DIAGNOSIS — Z951 Presence of aortocoronary bypass graft: Secondary | ICD-10-CM | POA: Diagnosis not present

## 2018-06-13 LAB — BASIC METABOLIC PANEL
Anion gap: 8 (ref 5–15)
BUN: 15 mg/dL (ref 8–23)
CO2: 30 mmol/L (ref 22–32)
Calcium: 9 mg/dL (ref 8.9–10.3)
Chloride: 105 mmol/L (ref 98–111)
Creatinine, Ser: 0.83 mg/dL (ref 0.61–1.24)
GFR calc Af Amer: >60 mL/min (ref >60)
GFR calc non Af Amer: >60 mL/min (ref >60)
Glucose, Bld: 269 mg/dL — ABNORMAL HIGH (ref 70–99)
POTASSIUM: 3.9 mmol/L (ref 3.5–5.1)
Sodium: 143 mmol/L (ref 135–145)

## 2018-06-13 LAB — GLUCOSE, CAPILLARY
GLUCOSE-CAPILLARY: 216 mg/dL — AB (ref 70–99)
GLUCOSE-CAPILLARY: 154 mg/dL — AB (ref 70–99)

## 2018-06-13 LAB — CBC
HEMATOCRIT: 47.4 % (ref 40.0–52.0)
Hemoglobin: 16.1 g/dL (ref 13.0–18.0)
MCH: 31 pg (ref 26.0–34.0)
MCHC: 33.9 g/dL (ref 32.0–36.0)
MCV: 91.3 fL (ref 80.0–100.0)
Platelets: 176 10*3/uL (ref 150–440)
RBC: 5.19 MIL/uL (ref 4.40–5.90)
RDW: 15.5 % — ABNORMAL HIGH (ref 11.5–14.5)
WBC: 7.4 10*3/uL (ref 3.8–10.6)

## 2018-06-13 LAB — TROPONIN I

## 2018-06-13 LAB — ECHOCARDIOGRAM COMPLETE
Height: 66 in
Weight: 2730.18 oz

## 2018-06-13 MED ORDER — HEPARIN SODIUM (PORCINE) 5000 UNIT/ML IJ SOLN
5000.0000 [IU] | Freq: Three times a day (TID) | INTRAMUSCULAR | Status: DC
  Administered 2018-06-13 – 2018-06-14 (×4): 5000 [IU] via SUBCUTANEOUS
  Filled 2018-06-13 (×3): qty 1

## 2018-06-13 MED ORDER — IPRATROPIUM-ALBUTEROL 0.5-2.5 (3) MG/3ML IN SOLN
RESPIRATORY_TRACT | Status: AC
  Administered 2018-06-13: 3 mL
  Filled 2018-06-13: qty 3

## 2018-06-13 MED ORDER — ONDANSETRON HCL 4 MG PO TABS: 4.0000 mg | ORAL_TABLET | Freq: Four times a day (QID) | ORAL | Status: DC | PRN

## 2018-06-13 MED ORDER — ACETAMINOPHEN 650 MG RE SUPP: 650.0000 mg | Freq: Four times a day (QID) | RECTAL | Status: DC | PRN

## 2018-06-13 MED ORDER — SODIUM CHLORIDE 0.9 % IV SOLN
500.0000 mg | INTRAVENOUS | Status: DC
  Administered 2018-06-13 – 2018-06-14 (×2): 500 mg via INTRAVENOUS
  Filled 2018-06-13 (×2): qty 500

## 2018-06-13 MED ORDER — NICOTINE 21 MG/24HR TD PT24
21.0000 mg | MEDICATED_PATCH | Freq: Every day | TRANSDERMAL | Status: DC
  Administered 2018-06-13: 21 mg via TRANSDERMAL
  Filled 2018-06-13 (×2): qty 1

## 2018-06-13 MED ORDER — ONDANSETRON HCL 4 MG/2ML IJ SOLN
4.0000 mg | Freq: Four times a day (QID) | INTRAMUSCULAR | Status: DC | PRN
  Administered 2018-06-13: 4 mg via INTRAVENOUS
  Filled 2018-06-13: qty 2

## 2018-06-13 MED ORDER — TRAZODONE HCL 50 MG PO TABS: 25.0000 mg | ORAL_TABLET | Freq: Every evening | ORAL | Status: DC | PRN

## 2018-06-13 MED ORDER — ATORVASTATIN CALCIUM 20 MG PO TABS
80.0000 mg | ORAL_TABLET | Freq: Every day | ORAL | Status: DC
  Administered 2018-06-13: 80 mg via ORAL
  Filled 2018-06-13: qty 4

## 2018-06-13 MED ORDER — SODIUM CHLORIDE 0.9 % IV SOLN
1.0000 g | INTRAVENOUS | Status: DC
  Administered 2018-06-13 – 2018-06-14 (×2): 1 g via INTRAVENOUS
  Filled 2018-06-13 (×2): qty 1

## 2018-06-13 MED ORDER — CLOPIDOGREL BISULFATE 75 MG PO TABS
75.0000 mg | ORAL_TABLET | Freq: Every day | ORAL | Status: DC
  Administered 2018-06-13 – 2018-06-14 (×2): 75 mg via ORAL
  Filled 2018-06-13 (×2): qty 1

## 2018-06-13 MED ORDER — ACETAMINOPHEN 325 MG PO TABS: 650.0000 mg | ORAL_TABLET | Freq: Four times a day (QID) | ORAL | Status: DC | PRN

## 2018-06-13 MED ORDER — SODIUM CHLORIDE 0.9 % IV SOLN
Freq: Once | INTRAVENOUS | Status: AC
  Administered 2018-06-13: 03:00:00 via INTRAVENOUS

## 2018-06-13 MED ORDER — BISACODYL 5 MG PO TBEC: 5.0000 mg | DELAYED_RELEASE_TABLET | Freq: Every day | ORAL | Status: DC | PRN

## 2018-06-13 MED ORDER — DOCUSATE SODIUM 100 MG PO CAPS
100.0000 mg | ORAL_CAPSULE | Freq: Two times a day (BID) | ORAL | Status: DC
  Administered 2018-06-13 – 2018-06-14 (×3): 100 mg via ORAL
  Filled 2018-06-13 (×3): qty 1

## 2018-06-13 MED ORDER — HYDROCODONE-ACETAMINOPHEN 5-325 MG PO TABS: 1.0000 | ORAL_TABLET | ORAL | Status: DC | PRN

## 2018-06-13 MED ORDER — IPRATROPIUM-ALBUTEROL 0.5-2.5 (3) MG/3ML IN SOLN
3.0000 mL | Freq: Four times a day (QID) | RESPIRATORY_TRACT | Status: DC
  Administered 2018-06-13 – 2018-06-14 (×5): 3 mL via RESPIRATORY_TRACT
  Filled 2018-06-13 (×6): qty 3

## 2018-06-13 MED ORDER — ALUM & MAG HYDROXIDE-SIMETH 200-200-20 MG/5ML PO SUSP
30.0000 mL | ORAL | Status: DC | PRN
  Administered 2018-06-13: 30 mL via ORAL
  Filled 2018-06-13: qty 30

## 2018-06-13 MED ORDER — METHYLPREDNISOLONE SODIUM SUCC 125 MG IJ SOLR
80.0000 mg | Freq: Two times a day (BID) | INTRAMUSCULAR | Status: DC
  Administered 2018-06-13 – 2018-06-14 (×3): 80 mg via INTRAVENOUS
  Filled 2018-06-13 (×4): qty 2

## 2018-06-13 NOTE — Progress Notes (Signed)
*  PRELIMINARY RESULTS* Echocardiogram 2D Echocardiogram has been performed.  Dennis Preston 06/13/2018, 10:42 AM

## 2018-06-13 NOTE — ED Notes (Signed)
Pt returned from xray

## 2018-06-13 NOTE — Plan of Care (Signed)
  Problem: Education: Goal: Knowledge of General Education information will improve Description Including pain rating scale, medication(s)/side effects and non-pharmacologic comfort measures Outcome: Progressing   Problem: Clinical Measurements: Goal: Diagnostic test results will improve Outcome: Progressing Goal: Respiratory complications will improve Outcome: Progressing   Problem: Activity: Goal: Risk for activity intolerance will decrease Outcome: Progressing   Problem: Nutrition: Goal: Adequate nutrition will be maintained Outcome: Progressing   Problem: Coping: Goal: Level of anxiety will decrease Outcome: Progressing   Problem: Education: Goal: Knowledge of disease or condition will improve Outcome: Progressing Goal: Knowledge of the prescribed therapeutic regimen will improve Outcome: Progressing   Problem: Activity: Goal: Ability to tolerate increased activity will improve Outcome: Progressing Goal: Will verbalize the importance of balancing activity with adequate rest periods Outcome: Progressing   Problem: Respiratory: Goal: Ability to maintain a clear airway will improve Outcome: Progressing Goal: Levels of oxygenation will improve Outcome: Progressing Goal: Ability to maintain adequate ventilation will improve Outcome: Progressing

## 2018-06-13 NOTE — Progress Notes (Signed)
[employer] - [place] [facility]   PATIENT NAME: Dennis Preston    MR#:  [id number]  DATE OF BIRTH:  1950-11-03  SUBJECTIVE:  CHIEF COMPLAINT:   Chief Complaint  Patient presents with  . Shortness of Breath   Active smoker, came with worsening SOB for last week. Little better today, still on oxygen.  REVIEW OF SYSTEMS:  CONSTITUTIONAL: No fever, fatigue or weakness.  EYES: No blurred or double vision.  EARS, NOSE, AND THROAT: No tinnitus or ear pain.  RESPIRATORY: No cough,have shortness of breath, wheezing ,no hemoptysis.  CARDIOVASCULAR: No chest pain, orthopnea, edema.  GASTROINTESTINAL: No nausea, vomiting, diarrhea or abdominal pain.  GENITOURINARY: No dysuria, hematuria.  ENDOCRINE: No polyuria, nocturia,  HEMATOLOGY: No anemia, easy bruising or bleeding SKIN: No rash or lesion. MUSCULOSKELETAL: No joint pain or arthritis.   NEUROLOGIC: No tingling, numbness, weakness.  PSYCHIATRY: No anxiety or depression.   ROS  DRUG ALLERGIES:  No Known Allergies  VITALS:  Blood pressure 136/79, pulse 81, temperature 97.8 F (36.6 C), temperature source Oral, resp. rate 18, height 5\' 6"  (1.676 m), weight 77.4 kg, SpO2 96 %.  PHYSICAL EXAMINATION:  GENERAL:  67 y.o.-year-old patient lying in the bed with no acute distress.  EYES: Pupils equal, round, reactive to light and accommodation. No scleral icterus. Extraocular muscles intact.  HEENT: Head atraumatic, normocephalic. Oropharynx and nasopharynx clear.  NECK:  Supple, no jugular venous distention. No thyroid enlargement, no tenderness.  LUNGS: Normal breath sounds bilaterally, b/l wheezing, no crepitation. No use of accessory muscles of respiration.  CARDIOVASCULAR: S1, S2 normal. No murmurs, rubs, or gallops.  ABDOMEN: Soft, nontender, nondistended. Bowel sounds present. No organomegaly or mass.  EXTREMITIES: No pedal edema, cyanosis, or clubbing.  NEUROLOGIC: Cranial nerves II through XII are intact.  Muscle strength 5/5 in all extremities. Sensation intact. Gait not checked.  PSYCHIATRIC: The patient is alert and oriented x 3.  SKIN: No obvious rash, lesion, or ulcer.   Physical Exam LABORATORY PANEL:   CBC Recent Labs  Lab 06/13/18 0348  WBC 7.4  HGB 16.1  HCT 47.4  PLT 176   ------------------------------------------------------------------------------------------------------------------  Chemistries  Recent Labs  Lab 06/13/18 0348  NA 143  K 3.9  CL 105  CO2 30  GLUCOSE 269*  BUN 15  CREATININE 0.83  CALCIUM 9.0   ------------------------------------------------------------------------------------------------------------------  Cardiac Enzymes Recent Labs  Lab 06/13/18 0026 06/13/18 0348  TROPONINI <0.03 <0.03   ------------------------------------------------------------------------------------------------------------------  RADIOLOGY:  Dg Chest 2 View  Result Date: 06/12/2018 CLINICAL DATA:  Shortness of breath. COPD. Prior myocardial infarction. EXAM: CHEST - 2 VIEW COMPARISON:  07/09/2015 FINDINGS: Mild hyperinflation. Prior median sternotomy. Remote right rib trauma. Midline trachea. Normal heart size. Atherosclerosis in the transverse aorta. No pleural effusion or pneumothorax. Diffuse peribronchial thickening. No lobar consolidation. Suspect scarring along the left heart border, similar. IMPRESSION: No acute cardiopulmonary disease. Hyperinflation and interstitial thickening, suggesting COPD/chronic bronchitis. Aortic Atherosclerosis (ICD10-I70.0). Electronically Signed   By: Jeronimo GreavesKyle  Talbot M.D.   On: 06/12/2018 21:29    ASSESSMENT AND PLAN:   Active Problems:   COPD with acute exacerbation (HCC)  1.  Acute respiratory failure with hypoxia, likely secondary to acute COPD exacerbation.    IV steroids, oxygen, nebulizer treatments and IV antibiotics.   ruled out cardiac cause, ACS, CHF.  Continue to monitor on telemetry and followed troponin levels-  negative, check 2D echo. 2.  Acute COPD exacerbation, likely secondary to acute bronchitis, ruled out pneumonia,  treatment as  above, under #1. 3.  Tobacco abuse.  Smoking cessation was discussed with patient in detail for 4 min.  given nicotine patch. 4.  Coronary artery disease, status post CABG in the past.  Continue medical treatment.  Continues cardiac monitoring in place. troponin negative.   All the records are reviewed and case discussed with Care Management/Social Workerr. Management plans discussed with the patient, family and they are in agreement.  CODE STATUS: Full.  TOTAL TIME TAKING CARE OF THIS PATIENT: 35 minutes.     POSSIBLE D/C IN 1-2 DAYS, DEPENDING ON CLINICAL CONDITION.   Altamese Dilling M.D on 06/13/2018   Between 7am to 6pm - Pager - [phone]  After 6pm go to [email or web address] - password [email or web address]  [facility]  Office  [phone]  CC: Primary care physician; Raynelle Bring  Note: This dictation was prepared with Dragon dictation along with smaller phrase technology. Any transcriptional errors that result from this process are unintentional.

## 2018-06-13 NOTE — H&P (Signed)
[facility] [place] at [facility]   PATIENT NAME: Dennis Preston    MR#:  [id number]  DATE OF BIRTH:  03/13/1951  DATE OF ADMISSION:  06/12/2018  PRIMARY CARE PHYSICIAN: Raynelle Bring   REQUESTING/REFERRING PHYSICIAN:   CHIEF COMPLAINT:   Chief Complaint  Patient presents with  . Shortness of Breath    HISTORY OF PRESENT ILLNESS: Dennis Preston  is a 68 y.o. male with a known history of COPD, tobacco abuse, alcohol abuse, CAD, atrial fibrillation and other comorbidities. Patient presented to emergency room for shortness of breath going on for the past 3 to 4 days, gradually getting worse, both at rest and with exertion.  His symptoms improved in the emergency room with IV steroids and nebulizer treatment.  He denies chest pain but complains of productive cough, which is chronic for him, due to smoking.  No fever or chills, no nausea, vomiting, diarrhea, bleeding. Blood test done emergency room are notable for WBC at 10.9.  The reminder of the CBC and CMP are grossly unremarkable.  First troponin level is less than 0.03. His oxygen saturation in the emergency room was in 80s.  Patient is currently on 3 L oxygen, per nasal cannula, with oxygen saturation at 96%. EKG shows normal sinus rhythm with a heart rate of 66, normal intervals, no acute ischemic changes. Chest x-ray shows hyperinflation and interstitial thickening, suggesting COPD/chronic bronchitis.   PAST MEDICAL HISTORY:   Past Medical History:  Diagnosis Date  . Alcohol abuse   . Amaurosis fugax   . Atrial fibrillation (HCC)   . Carotid stenosis   . Coagulopathy (HCC)   . COPD (chronic obstructive pulmonary disease) (HCC)   . Coronary artery disease   . Depression   . Hyperlipemia   . Hypotension   . Leucocytosis   . Liver dysfunction   . Myocardial infarction (HCC)   . Peripheral artery disease (HCC)   . Tobacco abuse     PAST SURGICAL HISTORY:  Past Surgical History:   Procedure Laterality Date  . CARDIAC CATHETERIZATION N/A 07/10/2015   Procedure: Right/Left Heart Cath and Coronary Angiography;  Surgeon: Alwyn Pea, MD;  Location: [facility] INVASIVE CV LAB;  Service: Cardiovascular;  Laterality: N/A;  . CORONARY ARTERY BYPASS GRAFT      SOCIAL HISTORY:  Social History   Tobacco Use  . Smoking status: Current Every Day Smoker    Packs/day: 0.50    Types: Cigarettes  . Smokeless tobacco: Never Used  Substance Use Topics  . Alcohol use: Not Currently    FAMILY HISTORY:  Family History  Problem Relation Age of Onset  . Depression Sister     DRUG ALLERGIES: No Known Allergies  REVIEW OF SYSTEMS:   CONSTITUTIONAL: No fever, but patient complains of fatigue and generalized weakness.  EYES: No changes in vision.  EARS, NOSE, AND THROAT: No tinnitus or ear pain.  RESPIRATORY: Positive for chronic cough, shortness of breath, wheezing, no hemoptysis.  CARDIOVASCULAR: No chest pain, orthopnea, edema.  GASTROINTESTINAL: No nausea, vomiting, diarrhea or abdominal pain.  GENITOURINARY: No dysuria, hematuria.  ENDOCRINE: No polyuria, nocturia. HEMATOLOGY: No bleeding. SKIN: No rash or lesion. MUSCULOSKELETAL: No joint pain at this time.   NEUROLOGIC: No focal weakness.  PSYCHIATRY: No anxiety or depression.   MEDICATIONS AT HOME:  Prior to Admission medications   Medication Sig Start Date End Date Taking? Authorizing Provider  atorvastatin (LIPITOR) 80 MG tablet Take 1 tablet (80 mg total) by mouth daily at  6 PM. 07/10/15  Yes [place] SirenSainani, Vivek J, MD  clopidogrel (PLAVIX) 75 MG tablet Take 75 mg by mouth daily. 04/18/16  Yes [provider]      PHYSICAL EXAMINATION:   VITAL SIGNS: Blood pressure (!) 142/65, pulse 66, temperature 97.9 F (36.6 C), temperature source Oral, resp. rate (!) 24, height 5' 6.5" (1.689 m), weight 77.1 kg, SpO2 (!) 89 %.  GENERAL:  67 y.o.-year-old patient lying in the bed with mild distress, much improved  after steroids IV and nebulizer treatment.  EYES: Pupils equal, round, reactive to light and accommodation. No scleral icterus. Extraocular muscles intact.  HEENT: Head atraumatic, normocephalic. Oropharynx and nasopharynx clear.  NECK:  Supple, no jugular venous distention. No thyroid enlargement, no tenderness.  LUNGS: Reduced breath sounds and scattered wheezing noted bilaterally. No use of accessory muscles of respiration.  CARDIOVASCULAR: S1, S2 normal. No S3/S4.  ABDOMEN: Soft, nontender, nondistended. Bowel sounds present. No organomegaly or mass.  EXTREMITIES: No pedal edema, cyanosis, or clubbing.  NEUROLOGIC: Cranial nerves II through XII are intact. Muscle strength 5/5 in all extremities. Sensation intact.   PSYCHIATRIC: The patient is alert and oriented x 3.  SKIN: No obvious rash, lesion, or ulcer.   LABORATORY PANEL:   CBC Recent Labs  Lab 06/12/18 2049  WBC 10.9*  HGB 16.1  HCT 47.2  PLT 205  MCV 90.7  MCH 30.9  MCHC 34.1  RDW 15.3*   ------------------------------------------------------------------------------------------------------------------  Chemistries  Recent Labs  Lab 06/12/18 2049  NA 143  K 4.2  CL 107  CO2 28  GLUCOSE 84  BUN 15  CREATININE 0.87  CALCIUM 8.9   ------------------------------------------------------------------------------------------------------------------ estimated creatinine clearance is 76.8 mL/min (by C-G formula based on SCr of 0.87 mg/dL). ------------------------------------------------------------------------------------------------------------------ No results for input(s): TSH, T4TOTAL, T3FREE, THYROIDAB in the last 72 hours.  Invalid input(s): FREET3   Coagulation profile No results for input(s): INR, PROTIME in the last 168 hours. ------------------------------------------------------------------------------------------------------------------- No results for input(s): DDIMER in the last 72  hours. -------------------------------------------------------------------------------------------------------------------  Cardiac Enzymes Recent Labs  Lab 06/12/18 2049  TROPONINI <0.03   ------------------------------------------------------------------------------------------------------------------ Invalid input(s): POCBNP  ---------------------------------------------------------------------------------------------------------------  Urinalysis    Component Value Date/Time   COLORURINE YELLOW 05/10/2008 1654   APPEARANCEUR CLEAR 05/10/2008 1654   LABSPEC 1.021 05/10/2008 1654   PHURINE 5.5 05/10/2008 1654   GLUCOSEU NEGATIVE 05/10/2008 1654   HGBUR NEGATIVE 05/10/2008 1654   BILIRUBINUR NEGATIVE 05/10/2008 1654   KETONESUR NEGATIVE 05/10/2008 1654   PROTEINUR NEGATIVE 05/10/2008 1654   UROBILINOGEN 0.2 05/10/2008 1654   NITRITE NEGATIVE 05/10/2008 1654   LEUKOCYTESUR  05/10/2008 1654    NEGATIVE MICROSCOPIC NOT DONE ON URINES WITH NEGATIVE PROTEIN, BLOOD, LEUKOCYTES, NITRITE, OR GLUCOSE <1000 mg/dL.     RADIOLOGY: Dg Chest 2 View  Result Date: 06/12/2018 CLINICAL DATA:  Shortness of breath. COPD. Prior myocardial infarction. EXAM: CHEST - 2 VIEW COMPARISON:  07/09/2015 FINDINGS: Mild hyperinflation. Prior median sternotomy. Remote right rib trauma. Midline trachea. Normal heart size. Atherosclerosis in the transverse aorta. No pleural effusion or pneumothorax. Diffuse peribronchial thickening. No lobar consolidation. Suspect scarring along the left heart border, similar. IMPRESSION: No acute cardiopulmonary disease. Hyperinflation and interstitial thickening, suggesting COPD/chronic bronchitis. Aortic Atherosclerosis (ICD10-I70.0). Electronically Signed   By: Jeronimo GreavesKyle  Talbot M.D.   On: 06/12/2018 21:29    EKG: Orders placed or performed during the hospital encounter of 06/12/18  . EKG 12-Lead  . EKG 12-Lead  . ED EKG within 10 minutes  . ED EKG within 10 minutes  IMPRESSION AND PLAN:  1.  Acute respiratory failure with hypoxia, likely secondary to acute COPD exacerbation.  We will treat with IV steroids, oxygen, nebulizer treatments and IV antibiotics.  Will rule out cardiac cause, ACS, CHF.  Continue to monitor on telemetry and follow troponin levels, check 2D echo. 2.  Acute COPD exacerbation, likely secondary to acute bronchitis, possibly pneumonia, although not obvious per chest x-ray.  See treatment as above, under #1. 3.  Tobacco abuse.  Smoking cessation was discussed with patient in detail. 4.  Coronary artery disease, status post CABG in the past.  Continue medical treatment.  Continues cardiac monitoring in place.  Follow troponin levels.  All the records are reviewed and case discussed with ED provider. Management plans discussed with the patient, family and they are in agreement.  CODE STATUS: Full Code Status History    Date Active Date Inactive Code Status Order ID Comments User Context   07/10/2015 1501 07/10/2015 2059 Full Code [id number]  Alwyn Pea, MD Inpatient   07/09/2015 1156 07/10/2015 1501 Full Code [id number]  Enedina Finner, MD Inpatient    Advance Directive Documentation     Most Recent Value  Type of Advance Directive  Living will  Pre-existing out of facility DNR order (yellow form or pink MOST form)  -  "MOST" Form in Place?  -       TOTAL TIME TAKING CARE OF THIS PATIENT: 50 minutes.    Cammy Copa M.D on 06/13/2018 at 12:36 AM  Between 7am to 6pm - Pager - [phone]  After 6pm go to [email or web address] - password [email or web address] [facility] [place] at [facility]  [phone]  CC: Primary care physician; Raynelle Bring

## 2018-06-14 LAB — GLUCOSE, CAPILLARY
GLUCOSE-CAPILLARY: 186 mg/dL — AB (ref 70–99)
Glucose-Capillary: 144 mg/dL — ABNORMAL HIGH (ref 70–99)

## 2018-06-14 MED ORDER — TIOTROPIUM BROMIDE MONOHYDRATE 18 MCG IN CAPS: 18.0000 ug | ORAL_CAPSULE | Freq: Every day | RESPIRATORY_TRACT | 2 refills | Status: DC

## 2018-06-14 MED ORDER — NICOTINE 21 MG/24HR TD PT24: 21.0000 mg | MEDICATED_PATCH | Freq: Every day | TRANSDERMAL | 0 refills | Status: DC

## 2018-06-14 MED ORDER — PREDNISONE 10 MG (21) PO TBPK: ORAL_TABLET | ORAL | 0 refills | Status: DC

## 2018-06-14 MED ORDER — FLUTICASONE-SALMETEROL 100-50 MCG/DOSE IN AEPB: 1.0000 | INHALATION_SPRAY | Freq: Two times a day (BID) | RESPIRATORY_TRACT | 0 refills | Status: DC

## 2018-06-14 MED ORDER — AZITHROMYCIN 250 MG PO TABS: 250.0000 mg | ORAL_TABLET | Freq: Every day | ORAL | 0 refills | Status: AC

## 2018-06-14 MED ORDER — CEFUROXIME AXETIL 250 MG PO TABS: 250.0000 mg | ORAL_TABLET | Freq: Two times a day (BID) | ORAL | 0 refills | Status: AC

## 2018-06-14 MED ORDER — NICOTINE 14 MG/24HR TD PT24: 14.0000 mg | MEDICATED_PATCH | Freq: Every day | TRANSDERMAL | 0 refills | Status: DC

## 2018-06-14 NOTE — Progress Notes (Signed)
Pt ready for discharge home today per MD. Patient ambulated halfway around unit, oxygen dropped to 88-89% on room air briefly, returned to 95% after ambulation; pt tolerated well, just felt weak. Reviewed discharge instructions and prescriptions with pt and his wife; all questions answered and pt verbalized understanding. PIV removed, VSS. Pt assisted to car via volunteer.  Dennis AmmonsKorie Tamirah George

## 2018-06-14 NOTE — Progress Notes (Signed)
Resumed o2 after neb treatment

## 2018-06-15 LAB — HIV ANTIBODY (ROUTINE TESTING W REFLEX): HIV Screen 4th Generation wRfx: NONREACTIVE

## 2018-06-22 NOTE — Discharge Summary (Signed)
[facility] - [place] at [facility]   PATIENT NAME: Dennis Preston    MR#:  [id number]  DATE OF BIRTH:  1950-12-28  DATE OF ADMISSION:  06/12/2018 ADMITTING PHYSICIAN: Cammy Copa, MD  DATE OF DISCHARGE: 06/14/2018  1:15 PM  PRIMARY CARE PHYSICIAN: [facility]    ADMISSION DIAGNOSIS:  COPD exacerbation (HCC) [J44.1] Acute respiratory failure with hypoxia (HCC) [J96.01]  DISCHARGE DIAGNOSIS:  Active Problems:   COPD with acute exacerbation (HCC)   SECONDARY DIAGNOSIS:   Past Medical History:  Diagnosis Date  . Alcohol abuse   . Amaurosis fugax   . Atrial fibrillation (HCC)   . Carotid stenosis   . Coagulopathy (HCC)   . COPD (chronic obstructive pulmonary disease) (HCC)   . Coronary artery disease   . Depression   . Hyperlipemia   . Hypotension   . Leucocytosis   . Liver dysfunction   . Myocardial infarction (HCC)   . Peripheral artery disease (HCC)   . Tobacco abuse     HOSPITAL COURSE:    COPD with acute exacerbation (HCC)  1.Acute respiratory failure with hypoxia,likely secondary to acute COPD exacerbation.  IV steroids, oxygen, nebulizer treatments and IV antibiotics.  ruled out cardiac cause, ACS, CHF.Continue to monitor on telemetry and followed troponin levels- negative,check 2D echo. 2.Acute COPD exacerbation,likely secondary to acute bronchitis,ruled out pneumonia, treatment as above,under #1. 3.Tobacco abuse. Smoking cessation was discussed with patient in detail for 4 min.  given nicotine patch. 4.Coronary artery disease, status post CABG in the past.Continue medical treatment.Continues cardiac monitoring in place.troponin negative.   DISCHARGE CONDITIONS:   Stable.  CONSULTS OBTAINED:    DRUG ALLERGIES:  No Known Allergies  DISCHARGE MEDICATIONS:   Allergies as of 06/14/2018   No Known Allergies     Medication List    TAKE these medications   atorvastatin 80 MG  tablet Commonly known as:  LIPITOR Take 1 tablet (80 mg total) by mouth daily at 6 PM.   clopidogrel 75 MG tablet Commonly known as:  PLAVIX Take 75 mg by mouth daily.   Fluticasone-Salmeterol 100-50 MCG/DOSE Aepb Commonly known as:  ADVAIR Inhale 1 puff into the lungs 2 (two) times daily.   nicotine 14 mg/24hr patch Commonly known as:  NICODERM CQ - dosed in mg/24 hours Place 1 patch (14 mg total) onto the skin daily.   predniSONE 10 MG (21) Tbpk tablet Commonly known as:  STERAPRED UNI-PAK 21 TAB Take 6 tabs first day, 5 tab on day 2, then 4 on day 3rd, 3 tabs on day 4th , 2 tab on day 5th, and 1 tab on 6th day.   tiotropium 18 MCG inhalation capsule Commonly known as:  SPIRIVA Place 1 capsule (18 mcg total) into inhaler and inhale daily.     ASK your doctor about these medications   azithromycin 250 MG tablet Commonly known as:  ZITHROMAX Take 1 tablet (250 mg total) by mouth daily for 3 days. Take 2 tablets (500 mg) on  Day 1,  followed by 1 tablet (250 mg) once daily on Days 2 through 5. Ask about: Should I take this medication?   cefUROXime 250 MG tablet Commonly known as:  CEFTIN Take 1 tablet (250 mg total) by mouth 2 (two) times daily for 3 days. Ask about: Should I take this medication?        DISCHARGE INSTRUCTIONS:    Follow with PMD in 1 week,.  If you experience worsening of your admission symptoms, develop  shortness of breath, life threatening emergency, suicidal or homicidal thoughts you must seek medical attention immediately by calling 911 or calling your MD immediately  if symptoms less severe.  You Must read complete instructions/literature along with all the possible adverse reactions/side effects for all the Medicines you take and that have been prescribed to you. Take any new Medicines after you have completely understood and accept all the possible adverse reactions/side effects.   Please note  You were cared for by a hospitalist during your  hospital stay. If you have any questions about your discharge medications or the care you received while you were in the hospital after you are discharged, you can call the unit and asked to speak with the hospitalist on call if the hospitalist that took care of you is not available. Once you are discharged, your primary care physician will handle any further medical issues. Please note that NO REFILLS for any discharge medications will be authorized once you are discharged, as it is imperative that you return to your primary care physician (or establish a relationship with a primary care physician if you do not have one) for your aftercare needs so that they can reassess your need for medications and monitor your lab values.    Today   CHIEF COMPLAINT:   Chief Complaint  Patient presents with  . Shortness of Breath    HISTORY OF PRESENT ILLNESS:  Dennis Preston  is a 67 y.o. male with a known history of COPD, tobacco abuse, alcohol abuse, CAD, atrial fibrillation and other comorbidities. Patient presented to emergency room for shortness of breath going on for the past 3 to 4 days, gradually getting worse, both at rest and with exertion.  His symptoms improved in the emergency room with IV steroids and nebulizer treatment.  He denies chest pain but complains of productive cough, which is chronic for him, due to smoking.  No fever or chills, no nausea, vomiting, diarrhea, bleeding. Blood test done emergency room are notable for WBC at 10.9.  The reminder of the CBC and CMP are grossly unremarkable.  First troponin level is less than 0.03. His oxygen saturation in the emergency room was in 80s.  Patient is currently on 3 L oxygen, per nasal cannula, with oxygen saturation at 96%. EKG shows normal sinus rhythm with a heart rate of 66, normal intervals, no acute ischemic changes. Chest x-ray shows hyperinflation and interstitial thickening, suggesting COPD/chronic bronchitis.    VITAL SIGNS:   Blood pressure (!) 122/58, pulse 66, temperature 98 F (36.7 C), temperature source Oral, resp. rate 20, height 5\' 6"  (1.676 m), weight 77.4 kg, SpO2 92 %.  I/O:  No intake or output data in the 24 hours ending 06/22/18 0726  PHYSICAL EXAMINATION:   GENERAL:  67 y.o.-year-old patient lying in the bed with no acute distress.  EYES: Pupils equal, round, reactive to light and accommodation. No scleral icterus. Extraocular muscles intact.  HEENT: Head atraumatic, normocephalic. Oropharynx and nasopharynx clear.  NECK:  Supple, no jugular venous distention. No thyroid enlargement, no tenderness.  LUNGS: Normal breath sounds bilaterally, b/l mild wheezing, no crepitation. No use of accessory muscles of respiration.  CARDIOVASCULAR: S1, S2 normal. No murmurs, rubs, or gallops.  ABDOMEN: Soft, nontender, nondistended. Bowel sounds present. No organomegaly or mass.  EXTREMITIES: No pedal edema, cyanosis, or clubbing.  NEUROLOGIC: Cranial nerves II through XII are intact. Muscle strength 5/5 in all extremities. Sensation intact. Gait not checked.  PSYCHIATRIC: The patient is alert and  oriented x 3.  SKIN: No obvious rash, lesion, or ulcer.   DATA REVIEW:   CBC No results for input(s): WBC, HGB, HCT, PLT in the last 168 hours.  Chemistries  No results for input(s): NA, K, CL, CO2, GLUCOSE, BUN, CREATININE, CALCIUM, MG, AST, ALT, ALKPHOS, BILITOT in the last 168 hours.  Invalid input(s): GFRCGP  Cardiac Enzymes No results for input(s): TROPONINI in the last 168 hours.  Microbiology Results  No results found for this or any previous visit.  RADIOLOGY:  No results found.  EKG:   Orders placed or performed during the hospital encounter of 06/12/18  . EKG 12-Lead  . EKG 12-Lead  . ED EKG within 10 minutes  . ED EKG within 10 minutes      Management plans discussed with the patient, family and they are in agreement.  CODE STATUS: full. Code Status History    Date Active Date  Inactive Code Status Order ID Comments User Context   06/13/2018 0236 06/14/2018 1641 Full Code [id number]  Cammy Copa, MD Inpatient   07/10/2015 1501 07/10/2015 2059 Full Code [id number]  Alwyn Pea, MD Inpatient   07/09/2015 1156 07/10/2015 1501 Full Code [id number]  Enedina Finner, MD Inpatient    Advance Directive Documentation     Most Recent Value  Type of Advance Directive  Living will  Pre-existing out of facility DNR order (yellow form or pink MOST form)  -  "MOST" Form in Place?  -      TOTAL TIME TAKING CARE OF THIS PATIENT: 35 minutes.    Altamese Dilling M.D on 06/22/2018 at 7:26 AM  Between 7am to 6pm - Pager - [phone]  After 6pm go to [email or web address] - password [email or web address]  [facility]  Office  [phone]  CC: Primary care physician; Raynelle Bring   Note: This dictation was prepared with Dragon dictation along with smaller phrase technology. Any transcriptional errors that result from this process are unintentional.

## 2019-04-27 ENCOUNTER — Encounter: Payer: Self-pay | Admitting: *Deleted

## 2019-04-27 ENCOUNTER — Other Ambulatory Visit: Payer: Self-pay

## 2019-04-28 ENCOUNTER — Encounter: Payer: Self-pay | Admitting: Anesthesiology

## 2019-04-28 NOTE — Discharge Instructions (Signed)

## 2019-04-28 NOTE — Anesthesia Preprocedure Evaluation (Deleted)
Anesthesia Evaluation    Airway        Dental   Pulmonary COPD, Current Smoker (1/2 ppd),           Cardiovascular + CAD (s/p MI and CABG 2016), + CABG and + Peripheral Vascular Disease (carotid stenosis)  + dysrhythmias (a fib on Plavix)   CABG 2016- cardiology note of clearance states mild risk for CV complications.  Mod-severe COPD but stable, cutting back on tobacco.     Neuro/Psych PSYCHIATRIC DISORDERS Anxiety Depression Former alcohol abuse; quit over 14 years agoTIA   GI/Hepatic   Endo/Other    Renal/GU      Musculoskeletal   Abdominal   Peds  Hematology   Anesthesia Other Findings   Reproductive/Obstetrics                            Anesthesia Physical Anesthesia Plan  ASA: IV  Anesthesia Plan: General   Post-op Pain Management:    Induction: Intravenous  PONV Risk Score and Plan: 1 and Ondansetron  Airway Management Planned: LMA  Additional Equipment:   Intra-op Plan:   Post-operative Plan: Extubation in OR  Informed Consent:   Plan Discussed with:   Anesthesia Plan Comments: (Case canceled 05/03/19 for NPO violation.)      Anesthesia Quick Evaluation

## 2019-04-29 ENCOUNTER — Other Ambulatory Visit: Payer: Self-pay

## 2019-04-29 ENCOUNTER — Other Ambulatory Visit
Admission: RE | Admit: 2019-04-29 | Discharge: 2019-04-29 | Disposition: A | Discharge status: Home / Self Care | Payer: Medicare Other | Source: Ambulatory Visit | Attending: Orthopedic Surgery | Admitting: Orthopedic Surgery

## 2019-04-29 DIAGNOSIS — Z20828 Contact with and (suspected) exposure to other viral communicable diseases: Secondary | ICD-10-CM | POA: Insufficient documentation

## 2019-04-29 LAB — SARS CORONAVIRUS 2 (TAT 6-24 HRS): SARS Coronavirus 2: NEGATIVE

## 2019-05-03 ENCOUNTER — Ambulatory Visit
Admission: RE | Admit: 2019-05-03 | Discharge: 2019-05-03 | Disposition: A | Discharge status: Home / Self Care | Payer: Medicare Other | Attending: Orthopedic Surgery | Admitting: Orthopedic Surgery

## 2019-05-03 ENCOUNTER — Encounter
Admission: RE | Disposition: A | Discharge status: Home / Self Care | Payer: Self-pay | Source: Home / Self Care | Attending: Orthopedic Surgery

## 2019-05-03 DIAGNOSIS — Z5309 Procedure and treatment not carried out because of other contraindication: Secondary | ICD-10-CM | POA: Diagnosis not present

## 2019-05-03 DIAGNOSIS — M7022 Olecranon bursitis, left elbow: Secondary | ICD-10-CM | POA: Diagnosis not present

## 2019-05-03 HISTORY — DX: Presence of dental prosthetic device (complete) (partial): Z97.2

## 2019-05-03 SURGERY — DEBRIDEMENT, WOUND, WITH CLOSURE
Anesthesia: Choice

## 2019-05-03 SURGICAL SUPPLY — 39 items
APL PRP STRL LF DISP 70% ISPRP (MISCELLANEOUS) ×2
BNDG CMPR STD VLCR NS LF 5.8X4 (GAUZE/BANDAGES/DRESSINGS) ×2
BNDG ELASTIC 4X5.8 VLCR NS LF (GAUZE/BANDAGES/DRESSINGS) ×8 IMPLANT
BNDG ESMARK 4X12 TAN STRL LF (GAUZE/BANDAGES/DRESSINGS) ×4 IMPLANT
CANISTER SUCT 1200ML W/VALVE (MISCELLANEOUS) ×3 IMPLANT
CHLORAPREP W/TINT 26 (MISCELLANEOUS) ×8 IMPLANT
COVER LIGHT HANDLE FLEXIBLE (MISCELLANEOUS) ×8 IMPLANT
COVER WAND RF STERILE (DRAPES) ×4 IMPLANT
CUFF TOURN SGL QUICK 18X4 (TOURNIQUET CUFF) ×4 IMPLANT
CUFF TOURN SGL QUICK 24 (TOURNIQUET CUFF) ×3
CUFF TRNQT CYL 24X4X16.5-23 (TOURNIQUET CUFF) ×2 IMPLANT
DRAPE SHEET LG 3/4 BI-LAMINATE (DRAPES) ×4 IMPLANT
ELECT CAUTERY BLADE TIP 2.5 (TIP) ×3
ELECT REM PT RETURN 9FT ADLT (ELECTROSURGICAL) ×3
ELECTRODE CAUTERY BLDE TIP 2.5 (TIP) ×2 IMPLANT
ELECTRODE REM PT RTRN 9FT ADLT (ELECTROSURGICAL) ×2 IMPLANT
GAUZE SPONGE 4X4 12PLY STRL (GAUZE/BANDAGES/DRESSINGS) ×4 IMPLANT
GAUZE XEROFORM 1X8 LF (GAUZE/BANDAGES/DRESSINGS) ×4 IMPLANT
GLOVE BIO SURGEON STRL SZ7.5 (GLOVE) ×4 IMPLANT
GLOVE BIOGEL PI IND STRL 8 (GLOVE) ×2 IMPLANT
GLOVE BIOGEL PI INDICATOR 8 (GLOVE) ×2
GOWN STRL REIN 2XL XLG LVL4 (GOWN DISPOSABLE) ×3 IMPLANT
GOWN STRL REUS W/ TWL LRG LVL3 (GOWN DISPOSABLE) ×2 IMPLANT
GOWN STRL REUS W/TWL LRG LVL3 (GOWN DISPOSABLE) ×3
KIT TURNOVER KIT A (KITS) ×4 IMPLANT
NS IRRIG 500ML POUR BTL (IV SOLUTION) ×4 IMPLANT
PACK EXTREMITY ARMC (MISCELLANEOUS) ×4 IMPLANT
PAD ABD DERMACEA PRESS 5X9 (GAUZE/BANDAGES/DRESSINGS) ×4 IMPLANT
PAD CAST CTTN 4X4 STRL (SOFTGOODS) ×6 IMPLANT
PADDING CAST COTTON 4X4 STRL (SOFTGOODS) ×9
PENCIL SMOKE EVACUATOR (MISCELLANEOUS) ×3 IMPLANT
SLEEVE SUCTION 125 (MISCELLANEOUS) ×3 IMPLANT
SPLINT CAST 1 STEP 5X30 WHT (MISCELLANEOUS) IMPLANT
SPONGE LAP 18X18 RF (DISPOSABLE) ×8 IMPLANT
STAPLER SKIN PROX 35W (STAPLE) ×4 IMPLANT
SUT ETHILON 3-0 FS-10 30 BLK (SUTURE) ×3
SUT VIC AB 0 CT2 27 (SUTURE) ×4 IMPLANT
SUT VIC AB 2-0 CT2 27 (SUTURE) ×3 IMPLANT
SUTURE EHLN 3-0 FS-10 30 BLK (SUTURE) ×2 IMPLANT

## 2019-05-03 NOTE — Progress Notes (Signed)
Patient cancelled per anesthesia due to eating @ 0800. Informed about the importance of being NPO before surgery and r/s.

## 2019-05-04 ENCOUNTER — Other Ambulatory Visit: Payer: Self-pay

## 2019-05-04 ENCOUNTER — Other Ambulatory Visit
Admission: RE | Admit: 2019-05-04 | Discharge: 2019-05-04 | Disposition: A | Discharge status: Home / Self Care | Payer: Medicare Other | Source: Ambulatory Visit | Attending: Orthopedic Surgery | Admitting: Orthopedic Surgery

## 2019-05-04 DIAGNOSIS — Z20828 Contact with and (suspected) exposure to other viral communicable diseases: Secondary | ICD-10-CM | POA: Insufficient documentation

## 2019-05-04 DIAGNOSIS — Z01812 Encounter for preprocedural laboratory examination: Secondary | ICD-10-CM | POA: Insufficient documentation

## 2019-05-04 LAB — SARS CORONAVIRUS 2 (TAT 6-24 HRS): SARS Coronavirus 2: NEGATIVE

## 2019-05-06 ENCOUNTER — Encounter
Admission: RE | Disposition: A | Discharge status: Home / Self Care | Payer: Self-pay | Source: Home / Self Care | Attending: Orthopedic Surgery

## 2019-05-06 ENCOUNTER — Ambulatory Visit: Payer: Medicare Other | Admitting: Anesthesiology

## 2019-05-06 ENCOUNTER — Encounter: Payer: Self-pay | Admitting: Emergency Medicine

## 2019-05-06 ENCOUNTER — Ambulatory Visit
Admission: RE | Admit: 2019-05-06 | Discharge: 2019-05-06 | Disposition: A | Discharge status: Home / Self Care | Payer: Medicare Other | Attending: Orthopedic Surgery | Admitting: Orthopedic Surgery

## 2019-05-06 DIAGNOSIS — Z7951 Long term (current) use of inhaled steroids: Secondary | ICD-10-CM | POA: Insufficient documentation

## 2019-05-06 DIAGNOSIS — J449 Chronic obstructive pulmonary disease, unspecified: Secondary | ICD-10-CM | POA: Insufficient documentation

## 2019-05-06 DIAGNOSIS — M7022 Olecranon bursitis, left elbow: Secondary | ICD-10-CM | POA: Insufficient documentation

## 2019-05-06 DIAGNOSIS — F17210 Nicotine dependence, cigarettes, uncomplicated: Secondary | ICD-10-CM | POA: Diagnosis not present

## 2019-05-06 DIAGNOSIS — I252 Old myocardial infarction: Secondary | ICD-10-CM | POA: Insufficient documentation

## 2019-05-06 DIAGNOSIS — Z7902 Long term (current) use of antithrombotics/antiplatelets: Secondary | ICD-10-CM | POA: Insufficient documentation

## 2019-05-06 DIAGNOSIS — I739 Peripheral vascular disease, unspecified: Secondary | ICD-10-CM | POA: Insufficient documentation

## 2019-05-06 DIAGNOSIS — Z79899 Other long term (current) drug therapy: Secondary | ICD-10-CM | POA: Insufficient documentation

## 2019-05-06 DIAGNOSIS — I4891 Unspecified atrial fibrillation: Secondary | ICD-10-CM | POA: Diagnosis not present

## 2019-05-06 DIAGNOSIS — I2510 Atherosclerotic heart disease of native coronary artery without angina pectoris: Secondary | ICD-10-CM | POA: Insufficient documentation

## 2019-05-06 DIAGNOSIS — Z951 Presence of aortocoronary bypass graft: Secondary | ICD-10-CM | POA: Insufficient documentation

## 2019-05-06 HISTORY — PX: OLECRANON BURSECTOMY: SHX2097

## 2019-05-06 SURGERY — BURSECTOMY, ELBOW
Anesthesia: General | Site: Elbow | Laterality: Left

## 2019-05-06 MED ORDER — BUPIVACAINE LIPOSOME 1.3 % IJ SUSP
INTRAMUSCULAR | Status: AC
  Filled 2019-05-06: qty 20

## 2019-05-06 MED ORDER — ACETAMINOPHEN 10 MG/ML IV SOLN
INTRAVENOUS | Status: AC
  Filled 2019-05-06: qty 100

## 2019-05-06 MED ORDER — FENTANYL CITRATE (PF) 100 MCG/2ML IJ SOLN: 25.0000 ug | INTRAMUSCULAR | Status: DC | PRN

## 2019-05-06 MED ORDER — LACTATED RINGERS IV SOLN
INTRAVENOUS | Status: DC | PRN
  Administered 2019-05-06: 13:00:00 via INTRAVENOUS

## 2019-05-06 MED ORDER — CEFAZOLIN SODIUM-DEXTROSE 2-4 GM/100ML-% IV SOLN
2.0000 g | Freq: Once | INTRAVENOUS | Status: AC
  Administered 2019-05-06: 2 g via INTRAVENOUS

## 2019-05-06 MED ORDER — FENTANYL CITRATE (PF) 100 MCG/2ML IJ SOLN
INTRAMUSCULAR | Status: AC
  Filled 2019-05-06: qty 2

## 2019-05-06 MED ORDER — PROPOFOL 10 MG/ML IV BOLUS
INTRAVENOUS | Status: AC
  Filled 2019-05-06: qty 20

## 2019-05-06 MED ORDER — FAMOTIDINE 20 MG PO TABS
20.0000 mg | ORAL_TABLET | Freq: Once | ORAL | Status: AC
  Administered 2019-05-06: 20 mg via ORAL

## 2019-05-06 MED ORDER — SUGAMMADEX SODIUM 500 MG/5ML IV SOLN
INTRAVENOUS | Status: DC | PRN
  Administered 2019-05-06: 200 mg via INTRAVENOUS

## 2019-05-06 MED ORDER — ACETAMINOPHEN 500 MG PO TABS: 1000.0000 mg | ORAL_TABLET | Freq: Three times a day (TID) | ORAL | 2 refills | Status: DC

## 2019-05-06 MED ORDER — LACTATED RINGERS IV SOLN
INTRAVENOUS | Status: DC
  Administered 2019-05-06: 12:00:00 via INTRAVENOUS

## 2019-05-06 MED ORDER — FAMOTIDINE 20 MG PO TABS
ORAL_TABLET | ORAL | Status: AC
  Administered 2019-05-06: 20 mg via ORAL
  Filled 2019-05-06: qty 1

## 2019-05-06 MED ORDER — CEFAZOLIN SODIUM-DEXTROSE 2-4 GM/100ML-% IV SOLN
INTRAVENOUS | Status: AC
  Filled 2019-05-06: qty 100

## 2019-05-06 MED ORDER — SEVOFLURANE IN SOLN
RESPIRATORY_TRACT | Status: AC
  Filled 2019-05-06: qty 250

## 2019-05-06 MED ORDER — OXYCODONE HCL 5 MG PO TABS: 5.0000 mg | ORAL_TABLET | Freq: Once | ORAL | Status: DC | PRN

## 2019-05-06 MED ORDER — CEFAZOLIN SODIUM-DEXTROSE 2-4 GM/100ML-% IV SOLN: 2.0000 g | Freq: Once | INTRAVENOUS | Status: DC

## 2019-05-06 MED ORDER — LIDOCAINE HCL (CARDIAC) PF 100 MG/5ML IV SOSY
PREFILLED_SYRINGE | INTRAVENOUS | Status: DC | PRN
  Administered 2019-05-06: 70 mg via INTRAVENOUS

## 2019-05-06 MED ORDER — ONDANSETRON 4 MG PO TBDP: 4.0000 mg | ORAL_TABLET | Freq: Three times a day (TID) | ORAL | 0 refills | Status: DC | PRN

## 2019-05-06 MED ORDER — FENTANYL CITRATE (PF) 100 MCG/2ML IJ SOLN
INTRAMUSCULAR | Status: DC | PRN
  Administered 2019-05-06: 50 ug via INTRAVENOUS

## 2019-05-06 MED ORDER — DEXAMETHASONE SODIUM PHOSPHATE 10 MG/ML IJ SOLN
INTRAMUSCULAR | Status: DC | PRN
  Administered 2019-05-06: 10 mg via INTRAVENOUS

## 2019-05-06 MED ORDER — OXYCODONE HCL 5 MG/5ML PO SOLN: 5.0000 mg | Freq: Once | ORAL | Status: DC | PRN

## 2019-05-06 MED ORDER — HYDROCODONE-ACETAMINOPHEN 5-325 MG PO TABS: 1.0000 | ORAL_TABLET | Freq: Four times a day (QID) | ORAL | 0 refills | Status: DC | PRN

## 2019-05-06 MED ORDER — ROCURONIUM BROMIDE 100 MG/10ML IV SOLN
INTRAVENOUS | Status: DC | PRN
  Administered 2019-05-06: 50 mg via INTRAVENOUS

## 2019-05-06 MED ORDER — MIDAZOLAM HCL 2 MG/2ML IJ SOLN
INTRAMUSCULAR | Status: DC | PRN
  Administered 2019-05-06: 2 mg via INTRAVENOUS

## 2019-05-06 MED ORDER — PROPOFOL 10 MG/ML IV BOLUS
INTRAVENOUS | Status: DC | PRN
  Administered 2019-05-06: 140 mg via INTRAVENOUS

## 2019-05-06 MED ORDER — BUPIVACAINE HCL (PF) 0.5 % IJ SOLN
INTRAMUSCULAR | Status: AC
  Filled 2019-05-06: qty 30

## 2019-05-06 MED ORDER — MIDAZOLAM HCL 2 MG/2ML IJ SOLN
INTRAMUSCULAR | Status: AC
  Filled 2019-05-06: qty 2

## 2019-05-06 MED ORDER — ACETAMINOPHEN 10 MG/ML IV SOLN
INTRAVENOUS | Status: DC | PRN
  Administered 2019-05-06: 1000 mg via INTRAVENOUS

## 2019-05-06 MED ORDER — BUPIVACAINE LIPOSOME 1.3 % IJ SUSP
INTRAMUSCULAR | Status: DC | PRN
  Administered 2019-05-06: 14:00:00 15 mL

## 2019-05-06 MED ORDER — ONDANSETRON HCL 4 MG/2ML IJ SOLN
INTRAMUSCULAR | Status: DC | PRN
  Administered 2019-05-06: 4 mg via INTRAVENOUS

## 2019-05-06 SURGICAL SUPPLY — 41 items
APL PRP STRL LF DISP 70% ISPRP (MISCELLANEOUS) ×1
BNDG CMPR STD VLCR NS LF 5.8X4 (GAUZE/BANDAGES/DRESSINGS) ×1
BNDG ELASTIC 4X5.8 VLCR NS LF (GAUZE/BANDAGES/DRESSINGS) ×3 IMPLANT
CANISTER SUCT 1200ML W/VALVE (MISCELLANEOUS) ×3 IMPLANT
CHLORAPREP W/TINT 26 (MISCELLANEOUS) ×3 IMPLANT
COVER WAND RF STERILE (DRAPES) ×3 IMPLANT
CUFF TOURN SGL QUICK 18X4 (TOURNIQUET CUFF) ×3 IMPLANT
CUFF TOURN SGL QUICK 24 (TOURNIQUET CUFF) ×3
CUFF TRNQT CYL 24X4X16.5-23 (TOURNIQUET CUFF) ×1 IMPLANT
DRAPE 3/4 80X56 (DRAPES) ×3 IMPLANT
ELECT CAUTERY BLADE 6.4 (BLADE) ×3 IMPLANT
ELECT REM PT RETURN 9FT ADLT (ELECTROSURGICAL) ×3
ELECTRODE REM PT RTRN 9FT ADLT (ELECTROSURGICAL) ×1 IMPLANT
GAUZE XEROFORM 1X8 LF (GAUZE/BANDAGES/DRESSINGS) ×3 IMPLANT
GLOVE BIOGEL PI IND STRL 8 (GLOVE) ×1 IMPLANT
GLOVE BIOGEL PI INDICATOR 8 (GLOVE) ×2
GLOVE SURG SYN 7.5  E (GLOVE) ×2
GLOVE SURG SYN 7.5 E (GLOVE) ×1 IMPLANT
GLOVE SURG SYN 7.5 PF PI (GLOVE) ×1 IMPLANT
GOWN STRL REUS W/ TWL LRG LVL3 (GOWN DISPOSABLE) ×1 IMPLANT
GOWN STRL REUS W/ TWL XL LVL3 (GOWN DISPOSABLE) ×1 IMPLANT
GOWN STRL REUS W/TWL LRG LVL3 (GOWN DISPOSABLE) ×3
GOWN STRL REUS W/TWL XL LVL3 (GOWN DISPOSABLE) ×3
KIT TURNOVER KIT A (KITS) ×3 IMPLANT
NDL FILTER BLUNT 18X1 1/2 (NEEDLE) ×1 IMPLANT
NEEDLE FILTER BLUNT 18X 1/2SAF (NEEDLE) ×2
NEEDLE FILTER BLUNT 18X1 1/2 (NEEDLE) ×1 IMPLANT
NS IRRIG 500ML POUR BTL (IV SOLUTION) ×3 IMPLANT
PACK EXTREMITY ARMC (MISCELLANEOUS) ×3 IMPLANT
PAD ABD DERMACEA PRESS 5X9 (GAUZE/BANDAGES/DRESSINGS) ×3 IMPLANT
PAD CAST CTTN 4X4 STRL (SOFTGOODS) ×3 IMPLANT
PAD PREP 24X41 OB/GYN DISP (PERSONAL CARE ITEMS) ×3 IMPLANT
PADDING CAST COTTON 4X4 STRL (SOFTGOODS) ×9
SPLINT CAST 1 STEP 5X30 WHT (MISCELLANEOUS) IMPLANT
SPONGE LAP 18X18 RF (DISPOSABLE) ×3 IMPLANT
STAPLER SKIN PROX 35W (STAPLE) ×3 IMPLANT
SUT ETHILON 3-0 FS-10 30 BLK (SUTURE) ×3
SUT VIC AB 0 CT2 27 (SUTURE) ×3 IMPLANT
SUT VIC AB 2-0 CT2 27 (SUTURE) ×3 IMPLANT
SUTURE EHLN 3-0 FS-10 30 BLK (SUTURE) ×1 IMPLANT
SYR 5ML LL (SYRINGE) ×3 IMPLANT

## 2019-05-06 NOTE — Discharge Instructions (Addendum)
Elbow Surgery Post-Op Instructions   1. Splint/Cast: You will have a splint (3/4 cast) on your arm after surgery. Ensure that this remains clean and dry until follow up appointment. If this becomes wet, you need to call our offices to get it changed or else you risk skin breakdown.     2. Driving:  Plan on not driving for at least 2 weeks. Please note that you are advised NOT to drive while taking narcotic pain medications as you may be impaired and unsafe to drive.   3. Activity: Weight bearing: Non-weight bearing. Use sling for comfort as needed.    4. Medications:  -Take tylenol 500 mg every 8 hours for pain.  May stop tylenol when you are having minimal pain. Please be aware that total safe dose of tylenol (acetaminophen) is 3000mg /day. Your narcotic medication may have acetaminophen in it as well. Please do not exceed a total of 3000mg /day. Once weaned off narcotics, you can take up to 1000mg  3x/day.  - You have been provided a prescription for narcotic pain medicine. After surgery, take 1-2 narcotic tablets every 4-6 hours as needed for severe pain. If pain is not severe, you should be able to wean off narcotic medications a few days after surgery. - A prescription for anti-nausea medication will be provided in case the narcotic medicine causes nausea - take 1 tablet every 6 hours only if nauseated.   -Resume Xarelto on the day after surgery.    If you are taking prescription medication for anxiety, depression, insomnia, muscle spasm, chronic pain, or for attention deficit disorder you are advised that you are at a higher risk of adverse effects with use of narcotics post-op, including narcotic addiction/dependence, depressed breathing, death. If you use non-prescribed substances: alcohol, marijuana, cocaine, heroin, methamphetamines, etc., you are at a higher risk of adverse effects with use of narcotics post-op, including narcotic addiction/dependence, depressed breathing, death. You are  advised that taking > 50 morphine milligram equivalents (MME) of narcotic pain medication per day results in twice the risk of overdose or death. For your prescription provided: oxycodone 5 mg - taking more than 6 tablets per day. Be advised that we will prescribe narcotics short-term, for acute post-operative pain only.   6. Physical Therapy: No planned therapy for the first 2 weeks. We can discuss need for PT after 1st follow up visit in 2 weeks.    7. Work/School: May do light duty/desk job or return to school in approximately 1-2 weeks when off of narcotics, pain is well-controlled, and swelling has decreased. May not return to full work for at least 2 weeks.    8. Post-Op Appointments: Your first post-op appointment will be with Dr. Allena KatzPatel in approximately 2 weeks time.  If you find that they have not been scheduled please call the Orthopaedic Appointment front desk at [phone].  AMBULATORY SURGERY  DISCHARGE INSTRUCTIONS   1) The drugs that you were given will stay in your system until tomorrow so for the next 24 hours you should not:  A) Drive an automobile B) Make any legal decisions C) Drink any alcoholic beverage   2) You may resume regular meals tomorrow.  Today it is better to start with liquids and gradually work up to solid foods.  You may eat anything you prefer, but it is better to start with liquids, then soup and crackers, and gradually work up to solid foods.   3) Please notify your doctor immediately if you have any unusual bleeding, trouble  breathing, redness and pain at the surgery site, drainage, fever, or pain not relieved by medication.   4) Additional Instructions:   Please contact your physician with any problems or Same Day Surgery at [phone], Monday through Friday 6 am to 4 pm, or [place] at [facility] number at [phone].

## 2019-05-06 NOTE — Anesthesia Procedure Notes (Signed)
Procedure Name: Intubation Date/Time: 05/06/2019 12:58 PM Performed by: Justus Memory, CRNA Pre-anesthesia Checklist: Patient identified, Patient being monitored, Timeout performed, Emergency Drugs available and Suction available Patient Re-evaluated:Patient Re-evaluated prior to induction Oxygen Delivery Method: Circle system utilized Preoxygenation: Pre-oxygenation with 100% oxygen Induction Type: IV induction Ventilation: Mask ventilation without difficulty Laryngoscope Size: Mac and 3 Grade View: Grade I Tube type: Oral Tube size: 7.0 mm Number of attempts: 1 Airway Equipment and Method: Stylet Placement Confirmation: ETT inserted through vocal cords under direct vision,  positive ETCO2 and breath sounds checked- equal and bilateral Secured at: 21 cm Tube secured with: Tape Dental Injury: Teeth and Oropharynx as per pre-operative assessment

## 2019-05-06 NOTE — Op Note (Addendum)
DATE OF SURGERY: 05/06/2019   PRE-OP DIAGNOSIS:  1. Left olecranon bursitis   POST-OP DIAGNOSIS:  1. Left olecranon bursitis   PROCEDURES:  1. Left olecranon bursectomy 2. Left elbow debridement including deep fascia and subcutaneous tissue   SURGEON: Cato Mulligan, MD   ASSISTANT(S):  Stephens November, PA-S   ANESTHESIA: gen   TOTAL IV FLUIDS: see anesthesia record   ESTIMATED BLOOD LOSS: 25cc   TOURNIQUET TIME: 29 min   DRAINS:  none   SPECIMENS:  Culture swab x1 from olecranon bursa fluid Specimen x 1 of olecranon bursa tissue   COMPLICATIONS: None apparent.   INDICATIONS: Dennis Preston is a 68 y.o. male who has had focal swelling about his olecranon. He underwent non-surgical management with NSAIDs, compression, and an intrabursal steroid injection without adequate relief.  Additionally, he has had multiple aspirations of olecranon bursa fluid with recurrence.  His symptoms continue to persist. After discussion of risks, benefits, and alternatives, the patient agreed to proceed with surgery.  We discussed at length that there was a risk of wound breakdown, recurrence, and residual pain after surgery.   DETAILS OF PROCEDURE: The patient was identified in the preoperative holding area.  The patient was brought to the operating room and placed on the table. Anesthesia was administered. The patient was then transferred to the lateral position on a beanbag. Operative arm was placed in an arm holder so elbow rested at 90 degrees. Arm was prescrubbed with Hibiclens and alcohol, prepped with ChloraPrep and draped in the usual sterile fashion. A surgical time-out occurred. A sterile, well-padded sterile tourniquet was placed.  Appropriate IV antibiotics were administered.   The arm was elevated and tourniquet inflated to 226mmHg. A midline incision was created about the elbow curving laterally to avoid the tip of the olecranon. We sharply dissected down to the level of the olecranon  bursa.  It was identified and dissection was carried around the bursa underneath the subdermal layer of the skin.  A nick was made in the bursa and there was return of clear, serosanguineous fluid.  There was no apparent sign of infection, but culture samples were obtained. A combination of a rongeur, curette and knife was used to debride the olecranon bursa. This tissue was also sent as a specimen.  A rongeur and curette were used again to debride all loose and devitalized tissue including the remainder of the olecranon bursa and subcutaneous tissue. The triceps fascia and tip of the olecranon process were then visualized.  The distal portion of the triceps fascia was loose and debrided with a rongeur. The underlying triceps muscle was healthy and viable.  There was no tear of the insertion of the triceps.  The bone of the olecranon itself was not infected and appeared healthy. The wound was thoroughly irrigated.   Tourniquet was let down and hemostasis was achieved. The subdermal layer was closed with 3-0 vicryl. The skin was closed with staples. The wound was dressed with Xeroform, fluffs, ABD, and cotton wrap. A long-arm splint was applied .    Instrument, sponge, and needle counts were correct prior to wound closure and at the conclusion of the case. The patient was then awakened from anesthesia without complication     POST-OPERATIVE PLAN: Patient will be discharged to home. The patient will be NWB in splint for 2 weeks. He will follow up in ~10-14 days for transition out of splint and suture removal.  Resume home Xarelto on postoperative day #1.

## 2019-05-06 NOTE — Transfer of Care (Signed)
Immediate Anesthesia Transfer of Care Note  Patient: SHAWNMICHAEL PARENTEAU  Procedure(s) Performed: Ree Shay BURSECTOMY AND DEBRIDEMENT (Left Elbow)  Patient Location: PACU  Anesthesia Type:General  Level of Consciousness: sedated  Airway & Oxygen Therapy: Patient Spontanous Breathing and Patient connected to face mask oxygen  Post-op Assessment: Report given to RN and Post -op Vital signs reviewed and stable  Post vital signs: Reviewed and stable  Last Vitals:  Vitals Value Taken Time  BP 175/70 05/06/19 1430  Temp    Pulse 61 05/06/19 1434  Resp 22 05/06/19 1434  SpO2 100 % 05/06/19 1434  Vitals shown include unvalidated device data.  Last Pain:  Vitals:   05/06/19 1430  TempSrc:   PainSc: (P) Asleep         Complications: No apparent anesthesia complications

## 2019-05-06 NOTE — Anesthesia Preprocedure Evaluation (Addendum)
Anesthesia Evaluation  Patient identified by MRN, date of birth, ID band Patient awake    Reviewed: Allergy & Precautions, H&P , NPO status , Patient's Chart, lab work & pertinent test results  Airway Mallampati: II  TM Distance: >3 FB Neck ROM: full    Dental  (+) Edentulous Lower, Edentulous Upper   Pulmonary COPD, Current Smoker,           Cardiovascular (-) angina+ CAD, + Past MI, + CABG and + Peripheral Vascular Disease  + dysrhythmias Atrial Fibrillation      Neuro/Psych PSYCHIATRIC DISORDERS Anxiety Depression negative neurological ROS     GI/Hepatic negative GI ROS, (+)     substance abuse  alcohol use,   Endo/Other  negative endocrine ROS  Renal/GU      Musculoskeletal   Abdominal   Peds  Hematology negative hematology ROS (+)   Anesthesia Other Findings Past Medical History: No date: Alcohol abuse No date: Amaurosis fugax No date: Atrial fibrillation (HCC) No date: Carotid stenosis No date: Coagulopathy (HCC) No date: COPD (chronic obstructive pulmonary disease) (HCC) No date: Coronary artery disease No date: Depression No date: Hyperlipemia No date: Hypotension No date: Leucocytosis No date: Liver dysfunction No date: Myocardial infarction ([place])     Comment:  non-STEMI. approx 2016 No date: Peripheral artery disease (HCC)     Comment:  legs No date: Tobacco abuse No date: Wears dentures     Comment:  full upper and lower  Past Surgical History: 07/10/2015: CARDIAC CATHETERIZATION; N/A     Comment:  Procedure: Right/Left Heart Cath and Coronary               Angiography;  Surgeon: Yolonda Kida, MD;  Location:               [facility];  Service: Cardiovascular;                Laterality: N/A; 07/13/2015: CORONARY ARTERY BYPASS GRAFT     Comment:  [facility].  4 vessel     Reproductive/Obstetrics negative OB ROS                          Anesthesia  Physical Anesthesia Plan  ASA: III  Anesthesia Plan: General ETT   Post-op Pain Management:    Induction:   PONV Risk Score and Plan: Ondansetron, Dexamethasone, Midazolam and Treatment may vary due to age or medical condition  Airway Management Planned:   Additional Equipment:   Intra-op Plan:   Post-operative Plan:   Informed Consent: I have reviewed the patients History and Physical, chart, labs and discussed the procedure including the risks, benefits and alternatives for the proposed anesthesia with the patient or authorized representative who has indicated his/her understanding and acceptance.     Dental Advisory Given  Plan Discussed with: Anesthesiologist and CRNA  Anesthesia Plan Comments:         Anesthesia Quick Evaluation

## 2019-05-06 NOTE — H&P (Signed)
Paper H&P to be scanned into permanent record. H&P reviewed. No significant changes noted.  

## 2019-05-06 NOTE — Anesthesia Post-op Follow-up Note (Signed)
Anesthesia QCDR form completed.        

## 2019-05-08 ENCOUNTER — Encounter: Payer: Self-pay | Admitting: Orthopedic Surgery

## 2019-05-09 NOTE — OR Nursing (Signed)
Chart review.

## 2019-05-09 NOTE — Anesthesia Postprocedure Evaluation (Signed)
Anesthesia Post Note  Patient: Dennis Preston  Procedure(s) Performed: Ree Shay BURSECTOMY AND DEBRIDEMENT (Left Elbow)  Patient location during evaluation: PACU Anesthesia Type: General Level of consciousness: awake and alert Pain management: pain level controlled Vital Signs Assessment: post-procedure vital signs reviewed and stable Respiratory status: spontaneous breathing, nonlabored ventilation and respiratory function stable Cardiovascular status: blood pressure returned to baseline and stable Postop Assessment: no apparent nausea or vomiting Anesthetic complications: no     Last Vitals:  Vitals:   05/06/19 1515 05/06/19 1527  BP: (!) 169/65 (!) 173/59  Pulse: (!) 50 (!) 50  Resp: (!) 22 14  Temp: 36.6 C 36.6 C  SpO2: 93% 94%    Last Pain:  Vitals:   05/09/19 0812  TempSrc:   PainSc: 0-No pain                 Durenda Hurt

## 2019-05-10 LAB — SURGICAL PATHOLOGY

## 2019-05-11 LAB — AEROBIC/ANAEROBIC CULTURE W GRAM STAIN (SURGICAL/DEEP WOUND)
Culture: NO GROWTH
Gram Stain: NONE SEEN

## 2019-08-08 DIAGNOSIS — I700 Atherosclerosis of aorta: Secondary | ICD-10-CM | POA: Insufficient documentation

## 2019-08-08 DIAGNOSIS — J449 Chronic obstructive pulmonary disease, unspecified: Secondary | ICD-10-CM | POA: Insufficient documentation

## 2019-08-10 ENCOUNTER — Telehealth: Payer: Self-pay | Admitting: *Deleted

## 2019-08-10 DIAGNOSIS — Z87891 Personal history of nicotine dependence: Secondary | ICD-10-CM

## 2019-08-10 DIAGNOSIS — Z122 Encounter for screening for malignant neoplasm of respiratory organs: Secondary | ICD-10-CM

## 2019-08-10 NOTE — Telephone Encounter (Signed)
Received referral for initial lung cancer screening scan. Contacted patient and obtained smoking history,(current, 54 pack year) as well as answering questions related to screening process. Patient denies signs of lung cancer such as weight loss or hemoptysis. Patient denies comorbidity that would prevent curative treatment if lung cancer were found. Patient is scheduled for shared decision making visit and CT scan on 08/18/19 at 145pm.

## 2019-08-17 ENCOUNTER — Encounter: Payer: Self-pay | Admitting: Nurse Practitioner

## 2019-08-18 ENCOUNTER — Ambulatory Visit
Admission: RE | Admit: 2019-08-18 | Discharge: 2019-08-18 | Disposition: A | Discharge status: Home / Self Care | Payer: Medicare Other | Source: Ambulatory Visit | Attending: Nurse Practitioner | Admitting: Nurse Practitioner

## 2019-08-18 ENCOUNTER — Other Ambulatory Visit: Payer: Self-pay

## 2019-08-18 ENCOUNTER — Inpatient Hospital Stay: Payer: Medicare Other | Attending: Nurse Practitioner | Admitting: Nurse Practitioner

## 2019-08-18 DIAGNOSIS — Z122 Encounter for screening for malignant neoplasm of respiratory organs: Secondary | ICD-10-CM | POA: Diagnosis present

## 2019-08-18 DIAGNOSIS — Z87891 Personal history of nicotine dependence: Secondary | ICD-10-CM | POA: Insufficient documentation

## 2019-08-18 DIAGNOSIS — F17210 Nicotine dependence, cigarettes, uncomplicated: Secondary | ICD-10-CM

## 2019-08-18 NOTE — Progress Notes (Signed)
Virtual Visit via Video Enabled Telemedicine Note   I connected with Dennis Preston on 08/18/19 at 1:45 PM EST by video enabled telemedicine visit and verified that I am speaking with the correct person using two identifiers.   I discussed the limitations, risks, security and privacy concerns of performing an evaluation and management service by telemedicine and the availability of in-person appointments. I also discussed with the patient that there may be a patient responsible charge related to this service. The patient expressed understanding and agreed to proceed.   Other persons participating in the visit and their role in the encounter: Burgess Estelle, RN- checking in patient & navigation  Patient's location: [place]  Provider's location: Clinic  Chief Complaint: Low Dose CT Screening  Patient agreed to evaluation by telemedicine to discuss shared decision making for consideration of low dose CT lung cancer screening.    In accordance with CMS guidelines, patient has met eligibility criteria including age, absence of signs or symptoms of lung cancer.  Social History   Tobacco Use  . Smoking status: Current Every Day Smoker    Packs/day: 1.00    Years: 54.00    Pack years: 54.00    Types: Cigarettes  . Smokeless tobacco: Never Used  . Tobacco comment: was 2.5 PPD for 50 yrs until 2016  Substance Use Topics  . Alcohol use: Not Currently    Comment: quit over 15 yrs ago     A shared decision-making session was conducted prior to the performance of CT scan. This includes one or more decision aids, includes benefits and harms of screening, follow-up diagnostic testing, over-diagnosis, false positive rate, and total radiation exposure.   Counseling on the importance of adherence to annual lung cancer LDCT screening, impact of co-morbidities, and ability or willingness to undergo diagnosis and treatment is imperative for compliance of the program.   Counseling on the  importance of continued smoking cessation for former smokers; the importance of smoking cessation for current smokers, and information about tobacco cessation interventions have been given to patient including [place] and [employer] Quit  programs.   Written order for lung cancer screening with LDCT has been given to the patient and any and all questions have been answered to the best of my abilities.    Yearly follow up will be coordinated by Burgess Estelle, Thoracic Navigator.  I discussed the assessment and treatment plan with the patient. The patient was provided an opportunity to ask questions and all were answered. The patient agreed with the plan and demonstrated an understanding of the instructions.   The patient was advised to call back or seek an in-person evaluation if the symptoms worsen or if the condition fails to improve as anticipated.   I provided 15 minutes of face-to-face video visit time during this encounter, and > 50% was spent counseling as documented under my assessment & plan.   Beckey Rutter, DNP, AGNP-C [place] at [facility] [phone] (clinic)

## 2019-08-19 ENCOUNTER — Telehealth: Payer: Self-pay | Admitting: *Deleted

## 2019-08-19 NOTE — Telephone Encounter (Signed)
Notified patient of LDCT lung cancer screening program results with recommendation for 12 month follow up imaging. Also notified of incidental findings noted below and is encouraged to discuss further with PCP who will receive a copy of this note and/or the CT report. Patient verbalizes understanding.   IMPRESSION: 1. Lung-RADS 2, benign appearance or behavior. Continue annual screening with low-dose chest CT without contrast in 12 months. 2.  Emphysema. (ICD10-J43.9) 3.  Aortic Atherosclerois (ICD10-170.0)   

## 2019-11-07 ENCOUNTER — Ambulatory Visit: Payer: Medicare Other | Attending: Internal Medicine

## 2019-11-07 DIAGNOSIS — Z23 Encounter for immunization: Secondary | ICD-10-CM | POA: Insufficient documentation

## 2019-11-07 NOTE — Progress Notes (Signed)
   Covid-19 Vaccination Clinic  Name:  VANG KRAEGER    MRN: [id number] DOB: Oct 03, 1950  11/07/2019  Mr. Buzby was observed post Covid-19 immunization for 15 minutes without incidence. He was provided with Vaccine Information Sheet and instruction to access the V-Safe system.   Mr. Aspinall was instructed to call 911 with any severe reactions post vaccine: Marland Kitchen Difficulty breathing  . Swelling of your face and throat  . A fast heartbeat  . A bad rash all over your body  . Dizziness and weakness    Immunizations Administered    Name Date Dose VIS Date Route   Pfizer COVID-19 Vaccine 11/07/2019  8:55 AM 0.3 mL 09/09/2019 Intramuscular   Manufacturer: [employer], [employer]   Lot: [id number]   NDC: [address]-0

## 2019-11-30 ENCOUNTER — Ambulatory Visit: Payer: Medicare Other | Attending: Internal Medicine

## 2019-11-30 DIAGNOSIS — Z23 Encounter for immunization: Secondary | ICD-10-CM | POA: Insufficient documentation

## 2019-11-30 NOTE — Progress Notes (Signed)
   Covid-19 Vaccination Clinic  Name:  Dennis Preston    MRN: [id number] DOB: October 03, 1950  11/30/2019  Mr. Dennis Preston was observed post Covid-19 immunization for 15 minutes without incident. He was provided with Vaccine Information Sheet and instruction to access the V-Safe system.   Mr. Dennis Preston was instructed to call 911 with any severe reactions post vaccine: Marland Kitchen Difficulty breathing  . Swelling of face and throat  . A fast heartbeat  . A bad rash all over body  . Dizziness and weakness   Immunizations Administered    Name Date Dose VIS Date Route   Pfizer COVID-19 Vaccine 11/30/2019  9:48 AM 0.3 mL 09/09/2019 Intramuscular   Manufacturer: [employer], [employer]   Lot: [id number]   NDC: [address]-1

## 2020-01-12 ENCOUNTER — Other Ambulatory Visit
Admission: RE | Admit: 2020-01-12 | Discharge: 2020-01-12 | Disposition: A | Discharge status: Home / Self Care | Payer: Medicare Other | Source: Ambulatory Visit | Attending: Internal Medicine | Admitting: Internal Medicine

## 2020-01-12 DIAGNOSIS — Z951 Presence of aortocoronary bypass graft: Secondary | ICD-10-CM | POA: Diagnosis not present

## 2020-01-12 DIAGNOSIS — J449 Chronic obstructive pulmonary disease, unspecified: Secondary | ICD-10-CM | POA: Diagnosis not present

## 2020-01-12 DIAGNOSIS — I252 Old myocardial infarction: Secondary | ICD-10-CM | POA: Diagnosis not present

## 2020-01-12 DIAGNOSIS — Z79899 Other long term (current) drug therapy: Secondary | ICD-10-CM | POA: Diagnosis not present

## 2020-01-12 DIAGNOSIS — Z01812 Encounter for preprocedural laboratory examination: Secondary | ICD-10-CM | POA: Diagnosis present

## 2020-01-12 DIAGNOSIS — I998 Other disorder of circulatory system: Secondary | ICD-10-CM | POA: Insufficient documentation

## 2020-01-12 DIAGNOSIS — R600 Localized edema: Secondary | ICD-10-CM | POA: Diagnosis not present

## 2020-01-12 DIAGNOSIS — I2510 Atherosclerotic heart disease of native coronary artery without angina pectoris: Secondary | ICD-10-CM | POA: Diagnosis not present

## 2020-01-12 DIAGNOSIS — I480 Paroxysmal atrial fibrillation: Secondary | ICD-10-CM | POA: Insufficient documentation

## 2020-01-12 LAB — BRAIN NATRIURETIC PEPTIDE: B Natriuretic Peptide: 62 pg/mL (ref 0.0–100.0)

## 2020-01-19 ENCOUNTER — Other Ambulatory Visit
Admission: RE | Admit: 2020-01-19 | Discharge: 2020-01-19 | Disposition: A | Discharge status: Home / Self Care | Payer: Medicare Other | Source: Ambulatory Visit | Attending: Internal Medicine | Admitting: Internal Medicine

## 2020-01-19 ENCOUNTER — Other Ambulatory Visit: Payer: Self-pay

## 2020-01-19 DIAGNOSIS — Z20822 Contact with and (suspected) exposure to covid-19: Secondary | ICD-10-CM | POA: Insufficient documentation

## 2020-01-19 DIAGNOSIS — Z01812 Encounter for preprocedural laboratory examination: Secondary | ICD-10-CM | POA: Diagnosis present

## 2020-01-19 LAB — SARS CORONAVIRUS 2 (TAT 6-24 HRS): SARS Coronavirus 2: NEGATIVE

## 2020-01-23 ENCOUNTER — Other Ambulatory Visit: Payer: Self-pay

## 2020-01-23 ENCOUNTER — Encounter: Payer: Self-pay | Admitting: Internal Medicine

## 2020-01-23 ENCOUNTER — Encounter
Admission: RE | Disposition: A | Discharge status: Home / Self Care | Payer: Self-pay | Source: Home / Self Care | Attending: Internal Medicine

## 2020-01-23 ENCOUNTER — Ambulatory Visit
Admission: RE | Admit: 2020-01-23 | Discharge: 2020-01-23 | Disposition: A | Discharge status: Home / Self Care | Payer: Medicare Other | Attending: Internal Medicine | Admitting: Internal Medicine

## 2020-01-23 DIAGNOSIS — Z79899 Other long term (current) drug therapy: Secondary | ICD-10-CM | POA: Insufficient documentation

## 2020-01-23 DIAGNOSIS — I10 Essential (primary) hypertension: Secondary | ICD-10-CM | POA: Insufficient documentation

## 2020-01-23 DIAGNOSIS — I739 Peripheral vascular disease, unspecified: Secondary | ICD-10-CM | POA: Diagnosis not present

## 2020-01-23 DIAGNOSIS — Z8673 Personal history of transient ischemic attack (TIA), and cerebral infarction without residual deficits: Secondary | ICD-10-CM | POA: Insufficient documentation

## 2020-01-23 DIAGNOSIS — F17210 Nicotine dependence, cigarettes, uncomplicated: Secondary | ICD-10-CM | POA: Diagnosis not present

## 2020-01-23 DIAGNOSIS — I25119 Atherosclerotic heart disease of native coronary artery with unspecified angina pectoris: Secondary | ICD-10-CM | POA: Insufficient documentation

## 2020-01-23 DIAGNOSIS — E669 Obesity, unspecified: Secondary | ICD-10-CM | POA: Diagnosis not present

## 2020-01-23 DIAGNOSIS — I252 Old myocardial infarction: Secondary | ICD-10-CM | POA: Diagnosis not present

## 2020-01-23 DIAGNOSIS — Z6826 Body mass index (BMI) 26.0-26.9, adult: Secondary | ICD-10-CM | POA: Diagnosis not present

## 2020-01-23 DIAGNOSIS — J449 Chronic obstructive pulmonary disease, unspecified: Secondary | ICD-10-CM | POA: Diagnosis not present

## 2020-01-23 DIAGNOSIS — I998 Other disorder of circulatory system: Secondary | ICD-10-CM

## 2020-01-23 DIAGNOSIS — I480 Paroxysmal atrial fibrillation: Secondary | ICD-10-CM | POA: Insufficient documentation

## 2020-01-23 DIAGNOSIS — Z7902 Long term (current) use of antithrombotics/antiplatelets: Secondary | ICD-10-CM | POA: Insufficient documentation

## 2020-01-23 DIAGNOSIS — Z955 Presence of coronary angioplasty implant and graft: Secondary | ICD-10-CM | POA: Insufficient documentation

## 2020-01-23 DIAGNOSIS — R600 Localized edema: Secondary | ICD-10-CM | POA: Diagnosis not present

## 2020-01-23 HISTORY — PX: LEFT HEART CATH AND CORS/GRAFTS ANGIOGRAPHY: CATH118250

## 2020-01-23 SURGERY — LEFT HEART CATH AND CORS/GRAFTS ANGIOGRAPHY
Anesthesia: Moderate Sedation

## 2020-01-23 MED ORDER — MIDAZOLAM HCL 2 MG/2ML IJ SOLN
INTRAMUSCULAR | Status: DC | PRN
  Administered 2020-01-23: 1 mg via INTRAVENOUS

## 2020-01-23 MED ORDER — MIDAZOLAM HCL 2 MG/2ML IJ SOLN
INTRAMUSCULAR | Status: AC
  Filled 2020-01-23: qty 2

## 2020-01-23 MED ORDER — SODIUM CHLORIDE 0.9% FLUSH: 3.0000 mL | Freq: Two times a day (BID) | INTRAVENOUS | Status: DC

## 2020-01-23 MED ORDER — SODIUM CHLORIDE 0.9% FLUSH: 3.0000 mL | INTRAVENOUS | Status: DC | PRN

## 2020-01-23 MED ORDER — FENTANYL CITRATE (PF) 100 MCG/2ML IJ SOLN
INTRAMUSCULAR | Status: AC
  Filled 2020-01-23: qty 2

## 2020-01-23 MED ORDER — HEPARIN (PORCINE) IN NACL 1000-0.9 UT/500ML-% IV SOLN
INTRAVENOUS | Status: DC | PRN
  Administered 2020-01-23: 500 mL

## 2020-01-23 MED ORDER — SODIUM CHLORIDE 0.9 % IV SOLN: 250.0000 mL | INTRAVENOUS | Status: DC | PRN

## 2020-01-23 MED ORDER — SODIUM CHLORIDE 0.9 % WEIGHT BASED INFUSION: 3.0000 mL/kg/h | INTRAVENOUS | Status: AC

## 2020-01-23 MED ORDER — SODIUM CHLORIDE 0.9 % WEIGHT BASED INFUSION: 1.0000 mL/kg/h | INTRAVENOUS | Status: DC

## 2020-01-23 MED ORDER — IOHEXOL 300 MG/ML  SOLN
INTRAMUSCULAR | Status: DC | PRN
  Administered 2020-01-23: 17:00:00 200 mL

## 2020-01-23 MED ORDER — HEPARIN (PORCINE) IN NACL 1000-0.9 UT/500ML-% IV SOLN
INTRAVENOUS | Status: AC
  Filled 2020-01-23: qty 1000

## 2020-01-23 MED ORDER — ASPIRIN 81 MG PO CHEW: 81.0000 mg | CHEWABLE_TABLET | ORAL | Status: DC

## 2020-01-23 MED ORDER — FENTANYL CITRATE (PF) 100 MCG/2ML IJ SOLN
INTRAMUSCULAR | Status: DC | PRN
  Administered 2020-01-23: 25 ug via INTRAVENOUS

## 2020-01-23 SURGICAL SUPPLY — 14 items
CATH INFINITI 5 FR IM (CATHETERS) ×3 IMPLANT
CATH INFINITI 5 FR MPA2 (CATHETERS) ×3 IMPLANT
CATH INFINITI 5 FR RCB (CATHETERS) ×3 IMPLANT
CATH INFINITI 5FR ANG PIGTAIL (CATHETERS) ×3 IMPLANT
CATH INFINITI 5FR JL4 (CATHETERS) ×3 IMPLANT
CATH INFINITI JR4 5F (CATHETERS) ×3 IMPLANT
DEVICE CLOSURE MYNXGRIP 5F (Vascular Products) ×4 IMPLANT
KIT MANI 3VAL PERCEP (MISCELLANEOUS) ×4 IMPLANT
NDL PERC 18GX7CM (NEEDLE) IMPLANT
NEEDLE PERC 18GX7CM (NEEDLE) ×4 IMPLANT
PACK CARDIAC CATH (CUSTOM PROCEDURE TRAY) ×4 IMPLANT
SHEATH AVANTI 5FR X 11CM (SHEATH) ×3 IMPLANT
WIRE EMERALD 3MM-J .035X260CM (WIRE) ×3 IMPLANT
WIRE GUIDERIGHT .035X150 (WIRE) ×4 IMPLANT

## 2020-01-24 ENCOUNTER — Encounter: Payer: Self-pay | Admitting: Cardiology

## 2020-02-13 ENCOUNTER — Emergency Department: Payer: Medicare Other

## 2020-02-13 ENCOUNTER — Other Ambulatory Visit: Payer: Self-pay

## 2020-02-13 ENCOUNTER — Inpatient Hospital Stay
Admission: EM | Admit: 2020-02-13 | Discharge: 2020-02-15 | DRG: 190 | Disposition: A | Discharge status: Home Health | Payer: Medicare Other | Attending: Internal Medicine | Admitting: Internal Medicine

## 2020-02-13 DIAGNOSIS — J441 Chronic obstructive pulmonary disease with (acute) exacerbation: Principal | ICD-10-CM | POA: Diagnosis present

## 2020-02-13 DIAGNOSIS — R0602 Shortness of breath: Secondary | ICD-10-CM

## 2020-02-13 DIAGNOSIS — Z20822 Contact with and (suspected) exposure to covid-19: Secondary | ICD-10-CM | POA: Diagnosis present

## 2020-02-13 DIAGNOSIS — I739 Peripheral vascular disease, unspecified: Secondary | ICD-10-CM | POA: Diagnosis present

## 2020-02-13 DIAGNOSIS — Z7951 Long term (current) use of inhaled steroids: Secondary | ICD-10-CM

## 2020-02-13 DIAGNOSIS — R001 Bradycardia, unspecified: Secondary | ICD-10-CM | POA: Diagnosis present

## 2020-02-13 DIAGNOSIS — I959 Hypotension, unspecified: Secondary | ICD-10-CM | POA: Diagnosis present

## 2020-02-13 DIAGNOSIS — F17210 Nicotine dependence, cigarettes, uncomplicated: Secondary | ICD-10-CM | POA: Diagnosis present

## 2020-02-13 DIAGNOSIS — I480 Paroxysmal atrial fibrillation: Secondary | ICD-10-CM | POA: Diagnosis present

## 2020-02-13 DIAGNOSIS — I509 Heart failure, unspecified: Secondary | ICD-10-CM | POA: Diagnosis present

## 2020-02-13 DIAGNOSIS — Z972 Presence of dental prosthetic device (complete) (partial): Secondary | ICD-10-CM

## 2020-02-13 DIAGNOSIS — I252 Old myocardial infarction: Secondary | ICD-10-CM

## 2020-02-13 DIAGNOSIS — Z951 Presence of aortocoronary bypass graft: Secondary | ICD-10-CM

## 2020-02-13 DIAGNOSIS — Z7902 Long term (current) use of antithrombotics/antiplatelets: Secondary | ICD-10-CM

## 2020-02-13 DIAGNOSIS — J9622 Acute and chronic respiratory failure with hypercapnia: Secondary | ICD-10-CM | POA: Diagnosis present

## 2020-02-13 DIAGNOSIS — Z79899 Other long term (current) drug therapy: Secondary | ICD-10-CM

## 2020-02-13 DIAGNOSIS — J9621 Acute and chronic respiratory failure with hypoxia: Secondary | ICD-10-CM | POA: Diagnosis present

## 2020-02-13 DIAGNOSIS — I2510 Atherosclerotic heart disease of native coronary artery without angina pectoris: Secondary | ICD-10-CM | POA: Diagnosis present

## 2020-02-13 DIAGNOSIS — E785 Hyperlipidemia, unspecified: Secondary | ICD-10-CM | POA: Diagnosis present

## 2020-02-13 LAB — CBC
HCT: 49.1 % (ref 39.0–52.0)
Hemoglobin: 16.3 g/dL (ref 13.0–17.0)
MCH: 30.4 pg (ref 26.0–34.0)
MCHC: 33.2 g/dL (ref 30.0–36.0)
MCV: 91.6 fL (ref 80.0–100.0)
Platelets: 197 10*3/uL (ref 150–400)
RBC: 5.36 MIL/uL (ref 4.22–5.81)
RDW: 14.5 % (ref 11.5–15.5)
WBC: 6.6 10*3/uL (ref 4.0–10.5)
nRBC: 0 % (ref 0.0–0.2)

## 2020-02-13 LAB — BASIC METABOLIC PANEL
Anion gap: 11 (ref 5–15)
BUN: 24 mg/dL — ABNORMAL HIGH (ref 8–23)
CO2: 32 mmol/L (ref 22–32)
Calcium: 9.1 mg/dL (ref 8.9–10.3)
Chloride: 97 mmol/L — ABNORMAL LOW (ref 98–111)
Creatinine, Ser: 1.04 mg/dL (ref 0.61–1.24)
GFR calc Af Amer: >60 mL/min (ref >60)
GFR calc non Af Amer: >60 mL/min (ref >60)
Glucose, Bld: 114 mg/dL — ABNORMAL HIGH (ref 70–99)
Potassium: 4.1 mmol/L (ref 3.5–5.1)
Sodium: 140 mmol/L (ref 135–145)

## 2020-02-13 LAB — TROPONIN I (HIGH SENSITIVITY)
Troponin I (High Sensitivity): 13 ng/L (ref <18)
Troponin I (High Sensitivity): 14 ng/L (ref <18)

## 2020-02-13 LAB — SARS CORONAVIRUS 2 BY RT PCR (HOSPITAL ORDER, PERFORMED IN ~~LOC~~ HOSPITAL LAB): SARS Coronavirus 2: NEGATIVE

## 2020-02-13 LAB — BRAIN NATRIURETIC PEPTIDE: B Natriuretic Peptide: 53.1 pg/mL (ref 0.0–100.0)

## 2020-02-13 MED ORDER — ONDANSETRON HCL 4 MG/2ML IJ SOLN
4.0000 mg | Freq: Four times a day (QID) | INTRAMUSCULAR | Status: DC | PRN
  Administered 2020-02-13: 4 mg via INTRAVENOUS
  Filled 2020-02-13: qty 2

## 2020-02-13 MED ORDER — FLUTICASONE FUROATE-VILANTEROL 200-25 MCG/INH IN AEPB
1.0000 | INHALATION_SPRAY | Freq: Every day | RESPIRATORY_TRACT | Status: DC
  Administered 2020-02-14 – 2020-02-15 (×2): 1 via RESPIRATORY_TRACT
  Filled 2020-02-13: qty 28

## 2020-02-13 MED ORDER — ACETAMINOPHEN 325 MG PO TABS: 650.0000 mg | ORAL_TABLET | Freq: Four times a day (QID) | ORAL | Status: DC | PRN

## 2020-02-13 MED ORDER — CARVEDILOL 3.125 MG PO TABS
3.1250 mg | ORAL_TABLET | Freq: Two times a day (BID) | ORAL | Status: DC
  Administered 2020-02-13 – 2020-02-14 (×2): 3.125 mg via ORAL
  Filled 2020-02-13: qty 0.5
  Filled 2020-02-13: qty 1
  Filled 2020-02-13: qty 0.5
  Filled 2020-02-13: qty 1
  Filled 2020-02-13: qty 0.5

## 2020-02-13 MED ORDER — ACETAMINOPHEN 650 MG RE SUPP: 650.0000 mg | Freq: Four times a day (QID) | RECTAL | Status: DC | PRN

## 2020-02-13 MED ORDER — AZITHROMYCIN 500 MG PO TABS
500.0000 mg | ORAL_TABLET | Freq: Every day | ORAL | Status: DC
  Administered 2020-02-14 – 2020-02-15 (×2): 500 mg via ORAL
  Filled 2020-02-13 (×2): qty 1

## 2020-02-13 MED ORDER — METHYLPREDNISOLONE SODIUM SUCC 125 MG IJ SOLR
125.0000 mg | Freq: Once | INTRAMUSCULAR | Status: AC
  Administered 2020-02-13: 125 mg via INTRAVENOUS
  Filled 2020-02-13: qty 2

## 2020-02-13 MED ORDER — ENOXAPARIN SODIUM 40 MG/0.4ML ~~LOC~~ SOLN
40.0000 mg | SUBCUTANEOUS | Status: DC
  Administered 2020-02-13 – 2020-02-14 (×2): 40 mg via SUBCUTANEOUS
  Filled 2020-02-13 (×2): qty 0.4

## 2020-02-13 MED ORDER — SPIRONOLACTONE 25 MG PO TABS
12.5000 mg | ORAL_TABLET | Freq: Two times a day (BID) | ORAL | Status: DC
  Administered 2020-02-13 – 2020-02-15 (×4): 12.5 mg via ORAL
  Filled 2020-02-13 (×2): qty 0.5
  Filled 2020-02-13: qty 1
  Filled 2020-02-13: qty 0.5
  Filled 2020-02-13 (×2): qty 1
  Filled 2020-02-13: qty 0.5
  Filled 2020-02-13: qty 1
  Filled 2020-02-13: qty 0.5

## 2020-02-13 MED ORDER — CLOPIDOGREL BISULFATE 75 MG PO TABS
75.0000 mg | ORAL_TABLET | Freq: Every evening | ORAL | Status: DC
  Administered 2020-02-13 – 2020-02-14 (×2): 75 mg via ORAL
  Filled 2020-02-13 (×2): qty 1

## 2020-02-13 MED ORDER — SODIUM CHLORIDE 0.9% FLUSH
3.0000 mL | Freq: Once | INTRAVENOUS | Status: AC
  Administered 2020-02-13: 3 mL via INTRAVENOUS

## 2020-02-13 MED ORDER — IPRATROPIUM-ALBUTEROL 0.5-2.5 (3) MG/3ML IN SOLN
3.0000 mL | Freq: Once | RESPIRATORY_TRACT | Status: AC
  Administered 2020-02-13: 3 mL via RESPIRATORY_TRACT
  Filled 2020-02-13: qty 3

## 2020-02-13 MED ORDER — IPRATROPIUM-ALBUTEROL 0.5-2.5 (3) MG/3ML IN SOLN
3.0000 mL | Freq: Four times a day (QID) | RESPIRATORY_TRACT | Status: DC
  Administered 2020-02-13 – 2020-02-15 (×8): 3 mL via RESPIRATORY_TRACT
  Filled 2020-02-13 (×8): qty 3

## 2020-02-13 MED ORDER — METHYLPREDNISOLONE SODIUM SUCC 125 MG IJ SOLR
80.0000 mg | Freq: Three times a day (TID) | INTRAMUSCULAR | Status: DC
  Administered 2020-02-13 – 2020-02-15 (×5): 80 mg via INTRAVENOUS
  Filled 2020-02-13 (×6): qty 2

## 2020-02-13 MED ORDER — HYDROCODONE-HOMATROPINE 5-1.5 MG/5ML PO SYRP
5.0000 mL | ORAL_SOLUTION | Freq: Four times a day (QID) | ORAL | Status: DC | PRN
  Administered 2020-02-13: 5 mL via ORAL
  Filled 2020-02-13 (×2): qty 5

## 2020-02-13 MED ORDER — FUROSEMIDE 40 MG PO TABS
40.0000 mg | ORAL_TABLET | Freq: Two times a day (BID) | ORAL | Status: DC
  Administered 2020-02-13 – 2020-02-15 (×4): 40 mg via ORAL
  Filled 2020-02-13 (×4): qty 1

## 2020-02-13 MED ORDER — SODIUM CHLORIDE 0.9 % IV SOLN
500.0000 mg | Freq: Once | INTRAVENOUS | Status: AC
  Administered 2020-02-13: 500 mg via INTRAVENOUS
  Filled 2020-02-13: qty 500

## 2020-02-13 MED ORDER — ONDANSETRON HCL 4 MG PO TABS: 4.0000 mg | ORAL_TABLET | Freq: Four times a day (QID) | ORAL | Status: DC | PRN

## 2020-02-13 MED ORDER — ATORVASTATIN CALCIUM 20 MG PO TABS
80.0000 mg | ORAL_TABLET | Freq: Every day | ORAL | Status: DC
  Administered 2020-02-13 – 2020-02-14 (×2): 80 mg via ORAL
  Filled 2020-02-13 (×2): qty 4

## 2020-02-13 MED ORDER — SACUBITRIL-VALSARTAN 24-26 MG PO TABS
1.0000 | ORAL_TABLET | Freq: Two times a day (BID) | ORAL | Status: DC
  Administered 2020-02-13 – 2020-02-15 (×4): 1 via ORAL
  Filled 2020-02-13 (×5): qty 1

## 2020-02-13 NOTE — ED Triage Notes (Signed)
Pt comes into the ED via EMS from home with c/o increased SOB since Thursday with cough, congestion . Hx of COPD, afib. #20g LAC. Used inhaler this morning. RA 90%, 2L [place] 99%.Marland Kitchen

## 2020-02-13 NOTE — H&P (Signed)
History and Physical:    Dennis Preston   KDX:[id number] DOB: 1951/09/15 DOA: 02/13/2020  Referring MD/provider: Derrell Lolling, MD PCP: Tracie Harrier, MD   Patient coming from: Home  Chief Complaint: Shortness of breath  History of Present Illness:   Dennis Preston is an 69 y.o. male with medical history significant for severe COPD, CAD status post CABG, recent left heart cath on 01/23/2020 (medical therapy recommended post-cath), PVD, paroxysmal atrial fibrillation, presented to the hospital because of shortness of breath.  He said she started feeling short of breath about a week ago.  He is breathing progressively got worse.  He also has a bad cough that is sometimes productive of clear sputum.  The cough is so bad that it keeps him awake at night and he has not had any good sleep in the past few days.  He has also been wheezing.  He checked his oxygen saturation today and it was in the upper 80s.  He decided to come to the emergency room for further evaluation.  No fever, chills, chest pain, headache, dizziness, vomiting, diarrhea or abdominal pain.  ED Course:  The patient was given IV Solu-Medrol, DuoNeb via nebulizer, IV azithromycin  ROS:   ROS all other systems reviewed were negative  Past Medical History:   Past Medical History:  Diagnosis Date  . Alcohol abuse   . Amaurosis fugax   . Atrial fibrillation ([place])   . Carotid stenosis   . Coagulopathy ([place])   . COPD (chronic obstructive pulmonary disease) ([place])   . Coronary artery disease   . Depression   . Hyperlipemia   . Hypotension   . Leucocytosis   . Liver dysfunction   . Myocardial infarction ([place])    non-STEMI. approx 2016  . Peripheral artery disease (HCC)    legs  . Tobacco abuse   . Wears dentures    full upper and lower    Past Surgical History:   Past Surgical History:  Procedure Laterality Date  . CARDIAC CATHETERIZATION N/A 07/10/2015   Procedure: Right/Left Heart Cath and Coronary  Angiography;  Surgeon: Yolonda Kida, MD;  Location: [facility];  Service: Cardiovascular;  Laterality: N/A;  . CORONARY ARTERY BYPASS GRAFT  07/13/2015   [facility].  4 vessel  . LEFT HEART CATH AND CORS/GRAFTS ANGIOGRAPHY N/A 01/23/2020   Procedure: LEFT HEART CATH AND CORS/GRAFTS ANGIOGRAPHY;  Surgeon: Yolonda Kida, MD;  Location: [facility];  Service: Cardiovascular;  Laterality: N/A;  . OLECRANON BURSECTOMY Left 05/06/2019   Procedure: OLECRANON BURSECTOMY AND DEBRIDEMENT;  Surgeon: Leim Fabry, MD;  Location: [facility];  Service: Orthopedics;  Laterality: Left;    Social History:   Social History   Socioeconomic History  . Marital status: Married    Spouse name: Not on file  . Number of children: Not on file  . Years of education: Not on file  . Highest education level: Not on file  Occupational History  . Not on file  Tobacco Use  . Smoking status: Current Every Day Smoker    Packs/day: 1.00    Years: 54.00    Pack years: 54.00    Types: Cigarettes  . Smokeless tobacco: Never Used  . Tobacco comment: was 2.5 PPD for 50 yrs until 2016  Substance and Sexual Activity  . Alcohol use: Not Currently    Comment: quit over 15 yrs ago  . Drug use: Never  . Sexual activity: Not on file  Other  Topics Concern  . Not on file  Social History Narrative  . Not on file   Social Determinants of Health   Financial Resource Strain:   . Difficulty of Paying Living Expenses:   Food Insecurity:   . Worried About Programme researcher, broadcasting/film/video in the Last Year:   . Barista in the Last Year:   Transportation Needs:   . Freight forwarder (Medical):   Marland Kitchen Lack of Transportation (Non-Medical):   Physical Activity:   . Days of Exercise per Week:   . Minutes of Exercise per Session:   Stress:   . Feeling of Stress :   Social Connections:   . Frequency of Communication with Friends and Family:   . Frequency of Social Gatherings with Friends and Family:   .  Attends Religious Services:   . Active Member of Clubs or Organizations:   . Attends Banker Meetings:   Marland Kitchen Marital Status:   Intimate Partner Violence:   . Fear of Current or Ex-Partner:   . Emotionally Abused:   Marland Kitchen Physically Abused:   . Sexually Abused:     Allergies   Patient has no known allergies.  Family history:   Family History  Problem Relation Age of Onset  . Depression Sister     Current Medications:   Prior to Admission medications   Medication Sig Start Date End Date Taking? Authorizing Provider  acetaminophen (TYLENOL) 500 MG tablet Take 2 tablets (1,000 mg total) by mouth every 8 (eight) hours. Patient not taking: Reported on 01/16/2020 05/06/19 05/05/20  Signa Kell, MD  atorvastatin (LIPITOR) 80 MG tablet Take 1 tablet (80 mg total) by mouth daily at 6 PM. 07/10/15   Sainani, Rolly Pancake, MD  budesonide-formoterol (SYMBICORT) 160-4.5 MCG/ACT inhaler Inhale 2 puffs into the lungs 2 (two) times daily as needed (short of breath).     [provider]  carvedilol (COREG) 3.125 MG tablet Take 3.125 mg by mouth 2 (two) times daily. 01/11/20   [provider]  clopidogrel (PLAVIX) 75 MG tablet Take 75 mg by mouth every evening.  04/18/16   [provider]  furosemide (LASIX) 40 MG tablet Take 40 mg by mouth 2 (two) times daily.  12/26/19   [provider]  sacubitril-valsartan (ENTRESTO) 24-26 MG Take 1 tablet by mouth in the morning and at bedtime. 01/11/20   [provider]  spironolactone (ALDACTONE) 25 MG tablet Take 12.5 mg by mouth 2 (two) times daily.  01/11/20   [provider]    Physical Exam:   Vitals:   02/13/20 0841 02/13/20 0844 02/13/20 1217  BP:  109/64 (!) 111/98  Pulse:  70 75  Resp:  17 (!) 28  Temp:  98.4 F (36.9 C)   TempSrc:  Oral   SpO2:  97% 98%  Weight: 75.8 kg    Height: 5\' 6"  (1.676 m)       Physical Exam: Blood pressure (!) 111/98, pulse 75, temperature 98.4 F (36.9 C),  temperature source Oral, resp. rate (!) 28, height 5\' 6"  (1.676 m), weight 75.8 kg, SpO2 98 %. Gen: No acute distress. Head: Normocephalic, atraumatic. Eyes: Pupils equal, round and reactive to light. Extraocular movements intact.  Sclerae nonicteric.  Mouth: No pharyngeal exudates or erythema.  No lesions in the mouth.  He is wearing dentures. Neck: Supple, no thyromegaly, no lymphadenopathy, no jugular venous distention. Chest: Decreased air entry bilaterally.  Bilateral expiratory wheezing but no rales heard.  CV: Heart sounds are regular with an S1, S2. No murmurs, rubs or gallops.  Abdomen: Soft, nontender, nondistended with normal active bowel sounds. No palpable masses. Extremities: Extremities are without clubbing, or cyanosis.  Trace bilateral leg edema (R>L). Pedal pulses 2+.  Skin: Warm and dry. No rashes Neuro: Alert and oriented times 3; grossly nonfocal.  Psych: Insight is good and judgment is appropriate. Mood and affect normal.   Data Review:    Labs: Basic Metabolic Panel: Recent Labs  Lab 02/13/20 0844  NA 140  K 4.1  CL 97*  CO2 32  GLUCOSE 114*  BUN 24*  CREATININE 1.04  CALCIUM 9.1   Liver Function Tests: No results for input(s): AST, ALT, ALKPHOS, BILITOT, PROT, ALBUMIN in the last 168 hours. No results for input(s): LIPASE, AMYLASE in the last 168 hours. No results for input(s): AMMONIA in the last 168 hours. CBC: Recent Labs  Lab 02/13/20 0844  WBC 6.6  HGB 16.3  HCT 49.1  MCV 91.6  PLT 197   Cardiac Enzymes: No results for input(s): CKTOTAL, CKMB, CKMBINDEX, TROPONINI in the last 168 hours.  BNP (last 3 results) No results for input(s): PROBNP in the last 8760 hours. CBG: No results for input(s): GLUCAP in the last 168 hours.  Urinalysis    Component Value Date/Time   COLORURINE YELLOW 05/10/2008 1654   APPEARANCEUR CLEAR 05/10/2008 1654   LABSPEC 1.021 05/10/2008 1654   PHURINE 5.5 05/10/2008 1654   GLUCOSEU NEGATIVE 05/10/2008  1654   HGBUR NEGATIVE 05/10/2008 1654   BILIRUBINUR NEGATIVE 05/10/2008 1654   KETONESUR NEGATIVE 05/10/2008 1654   PROTEINUR NEGATIVE 05/10/2008 1654   UROBILINOGEN 0.2 05/10/2008 1654   NITRITE NEGATIVE 05/10/2008 1654   LEUKOCYTESUR  05/10/2008 1654    NEGATIVE MICROSCOPIC NOT DONE ON URINES WITH NEGATIVE PROTEIN, BLOOD, LEUKOCYTES, NITRITE, OR GLUCOSE <1000 mg/dL.      Radiographic Studies: DG Chest 2 View  Result Date: 02/13/2020 CLINICAL DATA:  Shortness of breath with cough and congestion. History of atrial fibrillation EXAM: CHEST - 2 VIEW COMPARISON:  Chest radiograph June 12, 2018 and chest CT August 18, 2019. FINDINGS: Lungs are mildly hyperexpanded. No edema or airspace opacity. Mild scarring left base region. Heart size and pulmonary vascularity are normal. Patient is status post coronary artery bypass grafting. There is aortic atherosclerosis. No adenopathy. There is old rib trauma on the right, healed. IMPRESSION: Lungs mildly hyperexpanded with slight scarring left base. No edema or airspace opacity. Stable cardiac silhouette. Status post coronary artery bypass grafting. Aortic Atherosclerosis (ICD10-I70.0). Electronically Signed   By: Bretta Bang III M.D.   On: 02/13/2020 09:21    EKG: Independently reviewed.  Normal sinus rhythm with occasional PVCs   Assessment/Plan:   Active Problems:   COPD exacerbation (HCC)  Acute COPD exacerbation: Admit to MedSurg.  Treat with IV steroids, bronchodilators and antibiotics.  Acute hypoxemic respiratory failure and chronic hypercapnic respiratory failure: Venous blood gas showed pH of 7.32 and PCO2 of 72.  Treat with oxygen via nasal cannula.  CAD s/p CABG: Continue Plavix, Lipitor, carvedilol and Entresto.  Peripheral edema: Continue Lasix and Aldactone.  I suspect patient probably has underlying chronic CHF since he is on Entresto and diuretics.  However I did not see any diagnosis of CHF on chart  review.    Body mass index is 26.95 kg/m.  Other information:   DVT prophylaxis: Lovenox Code Status: Full code. Family Communication: Plan discussed with his wife at the bedside Disposition Plan: Possible  discharge to home in 1 to 2 days Consults called: None Admission status: Observation   The medical decision making is of moderate complexity, therefore this is a level 2 visit.  Time spent 60 minutes  Yiselle Babich [facility]   How to contact the [facility] Attending or Consulting provider 7A - 7P or covering provider during after hours 7P -7A, for this patient?   1. Check the care team in [facility] and look for a) attending/consulting TR[facility] provider listed and b) the [facility] team listed 2. Log into [email or web address] and use [facility]'s universal password to access. If you do not have the password, please contact the hospital operator. 3. Locate the [facility] provider you are looking for under [facility] and page to a number that you can be directly reached. 4. If you still have difficulty reaching the provider, please page the [facility] (Director on Call) for the Hospitalists listed on amion for assistance.  02/13/2020, 3:23 PM

## 2020-02-13 NOTE — ED Provider Notes (Signed)
[facility] Emergency Department Provider Note  ____________________________________________   First MD Initiated Contact with Patient 02/13/20 1207     (approximate)  I have reviewed the triage vital signs and the nursing notes.  History  Chief Complaint Shortness of Breath    HPI Dennis Preston is a 69 y.o. male past medical history as below, including paroxysmal AF, CAD, s/p CABG, COPD, who presents to the emergency department for cough and shortness of breath.  Symptoms first started on Thursday and have been constant since onset, progressively worsening.  Cough is productive of a whitish type sputum.  No fevers.  Has some associated chest discomfort, which he attributes to his frequent coughing.  He states his cough was so bad that he was unable to get any sleep last night because of it.  Associated with some mild loose stools.  No vomiting.  No sick contacts.  Has been fully vaccinated against COVID.  Checked his pulse ox at home this morning and noted it to be low in the upper 80s. Was 90% on RA here, placed on 2 L [place] with improvement. Does not normally wear oxygen. Normal oxygen saturation is in the mid to upper 90s.   Past Medical Hx Past Medical History:  Diagnosis Date  . Alcohol abuse   . Amaurosis fugax   . Atrial fibrillation (HCC)   . Carotid stenosis   . Coagulopathy (HCC)   . COPD (chronic obstructive pulmonary disease) (HCC)   . Coronary artery disease   . Depression   . Hyperlipemia   . Hypotension   . Leucocytosis   . Liver dysfunction   . Myocardial infarction (HCC)    non-STEMI. approx 2016  . Peripheral artery disease (HCC)    legs  . Tobacco abuse   . Wears dentures    full upper and lower    Problem List Patient Active Problem List   Diagnosis Date Noted  . COPD with acute exacerbation (HCC) 06/13/2018  . NSTEMI (non-ST elevated myocardial infarction) (HCC) 07/10/2015  . Hypoxia 07/09/2015  . ANXIETY 02/24/2007  .  ERECTILE DYSFUNCTION 02/24/2007  . LIVER FUNCTION TESTS, ABNORMAL 02/24/2007    Past Surgical Hx Past Surgical History:  Procedure Laterality Date  . CARDIAC CATHETERIZATION N/A 07/10/2015   Procedure: Right/Left Heart Cath and Coronary Angiography;  Surgeon: Alwyn Pea, MD;  Location: [facility];  Service: Cardiovascular;  Laterality: N/A;  . CORONARY ARTERY BYPASS GRAFT  07/13/2015   [facility].  4 vessel  . LEFT HEART CATH AND CORS/GRAFTS ANGIOGRAPHY N/A 01/23/2020   Procedure: LEFT HEART CATH AND CORS/GRAFTS ANGIOGRAPHY;  Surgeon: Alwyn Pea, MD;  Location: [facility];  Service: Cardiovascular;  Laterality: N/A;  . OLECRANON BURSECTOMY Left 05/06/2019   Procedure: OLECRANON BURSECTOMY AND DEBRIDEMENT;  Surgeon: Signa Kell, MD;  Location: [facility];  Service: Orthopedics;  Laterality: Left;    Medications Prior to Admission medications   Medication Sig Start Date End Date Taking? Authorizing Provider  acetaminophen (TYLENOL) 500 MG tablet Take 2 tablets (1,000 mg total) by mouth every 8 (eight) hours. Patient not taking: Reported on 01/16/2020 05/06/19 05/05/20  Signa Kell, MD  atorvastatin (LIPITOR) 80 MG tablet Take 1 tablet (80 mg total) by mouth daily at 6 PM. 07/10/15   Sainani, Rolly Pancake, MD  budesonide-formoterol (SYMBICORT) 160-4.5 MCG/ACT inhaler Inhale 2 puffs into the lungs 2 (two) times daily as needed (short of breath).     [provider]  carvedilol (  COREG) 3.125 MG tablet Take 3.125 mg by mouth 2 (two) times daily. 01/11/20   [provider]  clopidogrel (PLAVIX) 75 MG tablet Take 75 mg by mouth every evening.  04/18/16   [provider]  furosemide (LASIX) 40 MG tablet Take 40 mg by mouth 2 (two) times daily.  12/26/19   [provider]  sacubitril-valsartan (ENTRESTO) 24-26 MG Take 1 tablet by mouth in the morning and at bedtime. 01/11/20   [provider]  spironolactone (ALDACTONE) 25 MG tablet Take 12.5  mg by mouth 2 (two) times daily.  01/11/20   [provider]    Allergies Patient has no known allergies.  Family Hx Family History  Problem Relation Age of Onset  . Depression Sister     Social Hx Social History   Tobacco Use  . Smoking status: Current Every Day Smoker    Packs/day: 1.00    Years: 54.00    Pack years: 54.00    Types: Cigarettes  . Smokeless tobacco: Never Used  . Tobacco comment: was 2.5 PPD for 50 yrs until 2016  Substance Use Topics  . Alcohol use: Not Currently    Comment: quit over 15 yrs ago  . Drug use: Never     Review of Systems  Constitutional: Negative for fever. Negative for chills. Eyes: Negative for visual changes. ENT: Negative for sore throat. Cardiovascular: Negative for chest pain. Respiratory: + for shortness of breath, cough. Gastrointestinal: Negative for nausea. Negative for vomiting.  Genitourinary: Negative for dysuria. Musculoskeletal: Negative for leg swelling. Skin: Negative for rash. Neurological: Negative for headaches.   Physical Exam  Vital Signs: ED Triage Vitals  Enc Vitals Group     BP 02/13/20 0844 109/64     Pulse Rate 02/13/20 0844 70     Resp 02/13/20 0844 17     Temp 02/13/20 0844 98.4 F (36.9 C)     Temp Source 02/13/20 0844 Oral     SpO2 02/13/20 0844 97 %     Weight 02/13/20 0841 167 lb (75.8 kg)     Height 02/13/20 0841 5\' 6"  (1.676 m)     Head Circumference --      Peak Flow --      Pain Score 02/13/20 0841 0     Pain Loc --      Pain Edu? --      Excl. in GC? --     Constitutional: Alert and oriented. NAD.  Head: Normocephalic. Atraumatic. Eyes: Conjunctivae clear. Sclera anicteric. Pupils equal and symmetric. Nose: No masses or lesions. No congestion or rhinorrhea. Mouth/Throat: Wearing mask.  Neck: No stridor. Trachea midline.  Cardiovascular: Normal rate, regular rhythm. Extremities well perfused. Respiratory: Normal respiratory effort. On 2 L [place]. Coarse lung sounds and  expiratory wheezing bilaterally.  Gastrointestinal: Soft. Non-distended. Non-tender.  Genitourinary: Deferred. Musculoskeletal:  No deformities. Neurologic:  Normal speech and language. No gross focal or lateralizing neurologic deficits are appreciated.  Skin: Skin is warm, dry and intact. No rash noted. Psychiatric: Mood and affect are appropriate for situation.  EKG  Personally reviewed and interpreted by myself.   Date: 02/13/20 Time: 0840 Rate: 74 Rhythm: V1 appears irregular, but the remainder of the leads appear c/w sinus with notable P waves before each QRS complex Axis: rightward Intervals: QTc 486 ms No STEMI    Radiology  Personally reviewed available imaging myself.   CXR - IMPRESSION:  Lungs mildly hyperexpanded with slight scarring left base. No edema  or airspace  opacity. Stable cardiac silhouette. Status post coronary  artery bypass grafting. Aortic Atherosclerosis (ICD10-I70.0).    Procedures  Procedure(s) performed (including critical care):  .Critical Care Performed by: Lilia Pro., MD Authorized by: Lilia Pro., MD   Critical care provider statement:    Critical care time (minutes):  35   Critical care was time spent personally by me on the following activities:  Discussions with consultants, evaluation of patient's response to treatment, examination of patient, ordering and performing treatments and interventions, ordering and review of laboratory studies, ordering and review of radiographic studies, pulse oximetry, re-evaluation of patient's condition, obtaining history from patient or surrogate and review of old charts     Initial Impression / Assessment and Plan / MDM / ED Course  69 y.o. male who presents to the ED for cough, SOB, and new 2 L [place] oxygen requirement.  On exam, he has coarse lung sounds and expiratory wheezing bilaterally.  Presentation seems most consistent with COPD exacerbation.  Also consider HF exacerbation, other  pulmonary infection such as pneumonia, bronchitis, COVID.  Will plan for labs, imaging, nebulizer treatments & steroids. Anticipated admission given new oxygen requirement.   Clinical Course as of Feb 13 1599  Mon Feb 13, 2020  1415 CXR w/o airspace opacity. Troponin x 2 negative. VBG notable for hypercarbia with pCO2 72, normal pH, likely acute on chronic. Presentation seems most c/w COPD exacerbation. Feeling slightly improved after nebulizer treatments. Will plan to admit for continued treatment of COPD exacerbation.    [SM]    Clinical Course User Index [SM] Lilia Pro., MD     _______________________________   As part of my medical decision making I have reviewed available labs, radiology tests, reviewed old records/performed chart review.    Final Clinical Impression(s) / ED Diagnosis  Final diagnoses:  SOB (shortness of breath)  COPD exacerbation ([place])       Note:  This document was prepared using Dragon voice recognition software and may include unintentional dictation errors.   Lilia Pro., MD 02/13/20 1600

## 2020-02-13 NOTE — Plan of Care (Signed)
  Problem: Education: Goal: Knowledge of [facility] Education information/materials will improve Outcome: Progressing Goal: Emotional status will improve Outcome: Progressing Goal: Mental status will improve Outcome: Progressing Goal: Verbalization of understanding the information provided will improve Outcome: Progressing   Problem: Activity: Goal: Interest or engagement in activities will improve Outcome: Progressing Goal: Sleeping patterns will improve Outcome: Progressing   

## 2020-02-14 DIAGNOSIS — J441 Chronic obstructive pulmonary disease with (acute) exacerbation: Secondary | ICD-10-CM | POA: Diagnosis present

## 2020-02-14 DIAGNOSIS — Z79899 Other long term (current) drug therapy: Secondary | ICD-10-CM | POA: Diagnosis not present

## 2020-02-14 DIAGNOSIS — Z951 Presence of aortocoronary bypass graft: Secondary | ICD-10-CM | POA: Diagnosis not present

## 2020-02-14 DIAGNOSIS — F17210 Nicotine dependence, cigarettes, uncomplicated: Secondary | ICD-10-CM | POA: Diagnosis present

## 2020-02-14 DIAGNOSIS — I509 Heart failure, unspecified: Secondary | ICD-10-CM | POA: Diagnosis present

## 2020-02-14 DIAGNOSIS — R0602 Shortness of breath: Secondary | ICD-10-CM | POA: Diagnosis present

## 2020-02-14 DIAGNOSIS — I739 Peripheral vascular disease, unspecified: Secondary | ICD-10-CM | POA: Diagnosis present

## 2020-02-14 DIAGNOSIS — Z20822 Contact with and (suspected) exposure to covid-19: Secondary | ICD-10-CM | POA: Diagnosis present

## 2020-02-14 DIAGNOSIS — I252 Old myocardial infarction: Secondary | ICD-10-CM | POA: Diagnosis not present

## 2020-02-14 DIAGNOSIS — Z7902 Long term (current) use of antithrombotics/antiplatelets: Secondary | ICD-10-CM | POA: Diagnosis not present

## 2020-02-14 DIAGNOSIS — J9621 Acute and chronic respiratory failure with hypoxia: Secondary | ICD-10-CM | POA: Diagnosis present

## 2020-02-14 DIAGNOSIS — E785 Hyperlipidemia, unspecified: Secondary | ICD-10-CM | POA: Diagnosis present

## 2020-02-14 DIAGNOSIS — I959 Hypotension, unspecified: Secondary | ICD-10-CM | POA: Diagnosis present

## 2020-02-14 DIAGNOSIS — I480 Paroxysmal atrial fibrillation: Secondary | ICD-10-CM | POA: Diagnosis present

## 2020-02-14 DIAGNOSIS — Z7951 Long term (current) use of inhaled steroids: Secondary | ICD-10-CM | POA: Diagnosis not present

## 2020-02-14 DIAGNOSIS — I2510 Atherosclerotic heart disease of native coronary artery without angina pectoris: Secondary | ICD-10-CM | POA: Diagnosis present

## 2020-02-14 DIAGNOSIS — R001 Bradycardia, unspecified: Secondary | ICD-10-CM | POA: Diagnosis present

## 2020-02-14 DIAGNOSIS — Z972 Presence of dental prosthetic device (complete) (partial): Secondary | ICD-10-CM | POA: Diagnosis not present

## 2020-02-14 DIAGNOSIS — J9622 Acute and chronic respiratory failure with hypercapnia: Secondary | ICD-10-CM | POA: Diagnosis present

## 2020-02-14 LAB — CBC
HCT: 48.2 % (ref 39.0–52.0)
Hemoglobin: 15.7 g/dL (ref 13.0–17.0)
MCH: 30.2 pg (ref 26.0–34.0)
MCHC: 32.6 g/dL (ref 30.0–36.0)
MCV: 92.7 fL (ref 80.0–100.0)
Platelets: 186 10*3/uL (ref 150–400)
RBC: 5.2 MIL/uL (ref 4.22–5.81)
RDW: 14 % (ref 11.5–15.5)
WBC: 4.8 10*3/uL (ref 4.0–10.5)
nRBC: 0 % (ref 0.0–0.2)

## 2020-02-14 LAB — BASIC METABOLIC PANEL
Anion gap: 6 (ref 5–15)
BUN: 25 mg/dL — ABNORMAL HIGH (ref 8–23)
CO2: 33 mmol/L — ABNORMAL HIGH (ref 22–32)
Calcium: 9 mg/dL (ref 8.9–10.3)
Chloride: 102 mmol/L (ref 98–111)
Creatinine, Ser: 0.99 mg/dL (ref 0.61–1.24)
GFR calc Af Amer: >60 mL/min (ref >60)
GFR calc non Af Amer: >60 mL/min (ref >60)
Glucose, Bld: 157 mg/dL — ABNORMAL HIGH (ref 70–99)
Potassium: 4.6 mmol/L (ref 3.5–5.1)
Sodium: 141 mmol/L (ref 135–145)

## 2020-02-14 LAB — HIV ANTIBODY (ROUTINE TESTING W REFLEX): HIV Screen 4th Generation wRfx: NONREACTIVE

## 2020-02-14 NOTE — Care Management Obs Status (Signed)
MEDICARE OBSERVATION STATUS NOTIFICATION   Patient Details  Name: Dennis Preston MRN: [id number] Date of Birth: 11-Jun-1951   Medicare Observation Status Notification Given:  Yes    Franchelle Foskett Clementeen Hoof, RN 02/14/2020, 3:13 PM

## 2020-02-14 NOTE — Progress Notes (Signed)
Progress Note    DEVLON Preston  WER:[id number] DOB: 05/25/1951  DOA: 02/13/2020 PCP: Tracie Harrier, MD      Brief Narrative:    Medical records reviewed and are as summarized below:  Dennis Preston is an 69 y.o. male with medical history significant for severe COPD, CAD status post CABG, recent left heart cath on 01/23/2020 (medical therapy recommended post-cath), PVD, paroxysmal atrial fibrillation, presented to the hospital because of shortness of breath, cough and wheezing.       Assessment/Plan:   Active Problems:   COPD exacerbation (HCC)   Acute COPD exacerbation: Continue IV steroids, bronchodilators and antibiotics.  Acute hypoxemic respiratory failure and chronic hypercapnic respiratory failure: Continue oxygen via nasal cannula.  Oxygen saturation with ambulation and evaluate need for home oxygen.  CAD s/p CABG: Continue Plavix, Lipitor, and Entresto.  History of CHF (specifics unknown) with peripheral edema: His wife said that Dennis Preston was prescribed for CHF.  2D echo in September 2019 showed EF estimated at 55 to 19% and LV diastolic parameters was normal.  Continue Lasix and Aldactone.  Sinus bradycardia and hypotension: Asymptomatic.  Hold carvedilol and monitor heart rate and BP.  Body mass index is 26.95 kg/m.   Family Communication/Anticipated D/C date and plan/Code Status   DVT prophylaxis: Lovenox Code Status: Full code Family Communication: Plan discussed with his wife Disposition Plan:    Status is: Observation  The patient will require care spanning > 2 midnights and should be moved to inpatient because: Inpatient level of care appropriate due to severity of illness  Dispo: The patient is from: Home              Anticipated d/c is to: Home              Anticipated d/c date is: 1 day              Patient currently is not medically stable to d/c.           Subjective:   Cough is a little better.  Breathing is  also a little better.  He was able to get some sleep last night.  He thinks that Hycodan syrup helped.  He noticed that his leg swelling has resolved.  He still does not feel back to baseline.  Objective:    Vitals:   02/14/20 0824 02/14/20 0854 02/14/20 1136 02/14/20 1151  BP: (!) 107/47 (!) 113/51 (!) 90/42 125/62  Pulse: (!) 48 63 (!) 47 (!) 46  Resp:    17  Temp: (!) 97.5 F (36.4 C)  (!) 97.5 F (36.4 C) 97.7 F (36.5 C)  TempSrc:   Oral   SpO2: 94%  94% 93%  Weight:      Height:       No data found.   Intake/Output Summary (Last 24 hours) at 02/14/2020 1513 Last data filed at 02/14/2020 1412 Gross per 24 hour  Intake 600 ml  Output 850 ml  Net -250 ml   Filed Weights   02/13/20 0841  Weight: 75.8 kg    Exam:  GEN: NAD SKIN: No rash EYES: No pallor or icterus ENT: MMM CV: RRR PULM: Improved air entry.  Mild bilateral expiratory wheezing.  No rales heard.   ABD: soft, ND, NT, +BS CNS: AAO x 3, non focal EXT: No edema or tenderness   Data Reviewed:   I have personally reviewed following labs and imaging studies:  Labs: Labs show the following:  Basic Metabolic Panel: Recent Labs  Lab 02/13/20 0844 02/14/20 0421  NA 140 141  K 4.1 4.6  CL 97* 102  CO2 32 33*  GLUCOSE 114* 157*  BUN 24* 25*  CREATININE 1.04 0.99  CALCIUM 9.1 9.0   GFR Estimated Creatinine Clearance: 64.4 mL/min (by C-G formula based on SCr of 0.99 mg/dL). Liver Function Tests: No results for input(s): AST, ALT, ALKPHOS, BILITOT, PROT, ALBUMIN in the last 168 hours. No results for input(s): LIPASE, AMYLASE in the last 168 hours. No results for input(s): AMMONIA in the last 168 hours. Coagulation profile No results for input(s): INR, PROTIME in the last 168 hours.  CBC: Recent Labs  Lab 02/13/20 0844 02/14/20 0421  WBC 6.6 4.8  HGB 16.3 15.7  HCT 49.1 48.2  MCV 91.6 92.7  PLT 197 186   Cardiac Enzymes: No results for input(s): CKTOTAL, CKMB, CKMBINDEX, TROPONINI  in the last 168 hours. BNP (last 3 results) No results for input(s): PROBNP in the last 8760 hours. CBG: No results for input(s): GLUCAP in the last 168 hours. D-Dimer: No results for input(s): DDIMER in the last 72 hours. Hgb A1c: No results for input(s): HGBA1C in the last 72 hours. Lipid Profile: No results for input(s): CHOL, HDL, LDLCALC, TRIG, CHOLHDL, LDLDIRECT in the last 72 hours. Thyroid function studies: No results for input(s): TSH, T4TOTAL, T3FREE, THYROIDAB in the last 72 hours.  Invalid input(s): FREET3 Anemia work up: No results for input(s): VITAMINB12, FOLATE, FERRITIN, TIBC, IRON, RETICCTPCT in the last 72 hours. Sepsis Labs: Recent Labs  Lab 02/13/20 0844 02/14/20 0421  WBC 6.6 4.8    Microbiology Recent Results (from the past 240 hour(s))  SARS Coronavirus 2 by RT PCR (hospital order, performed in [facility] hospital lab) Nasopharyngeal Nasopharyngeal Swab     Status: None   Collection Time: 02/13/20  2:19 PM   Specimen: Nasopharyngeal Swab  Result Value Ref Range Status   SARS Coronavirus 2 NEGATIVE NEGATIVE Final    Comment: (NOTE) SARS-CoV-2 target nucleic acids are NOT DETECTED. The SARS-CoV-2 RNA is generally detectable in upper and lower respiratory specimens during the acute phase of infection. The lowest concentration of SARS-CoV-2 viral copies this assay can detect is 250 copies / mL. A negative result does not preclude SARS-CoV-2 infection and should not be used as the sole basis for treatment or other patient management decisions.  A negative result may occur with improper specimen collection / handling, submission of specimen other than nasopharyngeal swab, presence of viral mutation(s) within the areas targeted by this assay, and inadequate number of viral copies (<250 copies / mL). A negative result must be combined with clinical observations, patient history, and epidemiological information. Fact Sheet for Patients:     BoilerBrush.com.cy Fact Sheet for Healthcare Providers: https://pope.com/ This test is not yet approved or cleared  by the [place] FDA and has been authorized for detection and/or diagnosis of SARS-CoV-2 by FDA under an Emergency Use Authorization (EUA).  This EUA will remain in effect (meaning this test can be used) for the duration of the COVID-19 declaration under Section 564(b)(1) of the Act, 21 U.S.C. section 360bbb-3(b)(1), unless the authorization is terminated or revoked sooner. Performed at [facility], [address] [place], [place] [address]     Procedures and diagnostic studies:  DG Chest 2 View  Result Date: 02/13/2020 CLINICAL DATA:  Shortness of breath with cough and congestion. History of atrial fibrillation EXAM: CHEST - 2 VIEW COMPARISON:  Chest radiograph  June 12, 2018 and chest CT August 18, 2019. FINDINGS: Lungs are mildly hyperexpanded. No edema or airspace opacity. Mild scarring left base region. Heart size and pulmonary vascularity are normal. Patient is status post coronary artery bypass grafting. There is aortic atherosclerosis. No adenopathy. There is old rib trauma on the right, healed. IMPRESSION: Lungs mildly hyperexpanded with slight scarring left base. No edema or airspace opacity. Stable cardiac silhouette. Status post coronary artery bypass grafting. Aortic Atherosclerosis (ICD10-I70.0). Electronically Signed   By: Bretta Bang III M.D.   On: 02/13/2020 09:21    Medications:   . atorvastatin  80 mg Oral q1800  . azithromycin  500 mg Oral Daily  . carvedilol  3.125 mg Oral BID  . clopidogrel  75 mg Oral QPM  . enoxaparin (LOVENOX) injection  40 mg Subcutaneous Q24H  . fluticasone furoate-vilanterol  1 puff Inhalation Daily  . furosemide  40 mg Oral BID  . ipratropium-albuterol  3 mL Nebulization Q6H  . methylPREDNISolone (SOLU-MEDROL) injection  80 mg Intravenous Q8H  .  sacubitril-valsartan  1 tablet Oral BID  . spironolactone  12.5 mg Oral BID   Continuous Infusions:   LOS: 0 days   Khalilah Hoke  [facility]     02/14/2020, 3:13 PM

## 2020-02-14 NOTE — Progress Notes (Signed)
D: Pt alert and oriented x 4. Pt denies experiencing any pain at this time.   A: Scheduled medications administered to pt, per MD orders. Support and encouragement provided. Frequent verbal contact made.    R: No adverse drug reactions noted. Pt complaint with medications and treatment plan. Pt interacts well with staff on the unit. Pt is stable at this time, Will continue to monitor and provide care for as ordered. 

## 2020-02-15 MED ORDER — PREDNISONE 20 MG PO TABS
40.0000 mg | ORAL_TABLET | Freq: Every day | ORAL | 0 refills | Status: AC
Start: 2020-02-15 — End: 2020-02-17

## 2020-02-15 MED ORDER — ALBUTEROL SULFATE (2.5 MG/3ML) 0.083% IN NEBU
2.5000 mg | INHALATION_SOLUTION | Freq: Four times a day (QID) | RESPIRATORY_TRACT | 0 refills | Status: DC | PRN
Start: 2020-02-15 — End: 2023-08-24

## 2020-02-15 MED ORDER — ALBUTEROL SULFATE HFA 108 (90 BASE) MCG/ACT IN AERS: 1.0000 | INHALATION_SPRAY | Freq: Four times a day (QID) | RESPIRATORY_TRACT | 0 refills | Status: DC | PRN

## 2020-02-15 MED ORDER — CARVEDILOL 3.125 MG PO TABS: 3.1250 mg | ORAL_TABLET | Freq: Every day | ORAL | Status: DC

## 2020-02-15 MED ORDER — HYDROCODONE-HOMATROPINE 5-1.5 MG/5ML PO SYRP: 5.0000 mL | ORAL_SOLUTION | Freq: Three times a day (TID) | ORAL | 0 refills | Status: DC | PRN

## 2020-02-15 NOTE — Discharge Summary (Signed)
Physician Discharge Summary  Dennis Preston WIO:[id number] DOB: [birth year] DOA: 02/13/2020  PCP: Tracie Harrier, MD  Admit date: 02/13/2020 Discharge date: 02/15/2020  Discharge disposition: Home   Recommendations for Outpatient Follow-Up:   Outpatient follow-up with PCP in 1 weeks   Discharge Diagnosis:   Active Problems:   COPD exacerbation ([place])    Discharge Condition: Stable.  Diet recommendation: Heart healthy diet  Code status: Full code.    Hospital Course:   Mr. Dennis Preston is a 69 y.o. male with medical history significant for severe COPD, CAD status post CABG, recent left heart cath on 01/23/2020 (medical therapy recommended post-cath), PVD, paroxysmal atrial fibrillation, presented to the hospital because of shortness of breath, cough and wheezing.   He was admitted to the hospital for acute COPD exacerbation.  He was treated with steroids, antibiotics and bronchodilators.  He was also hypoxemic and required oxygen via nasal cannula.  Unfortunately, he could not be weaned off of oxygen so he was discharged home on 2 L/min oxygen by nasal cannula for chronic hypoxemic respiratory failure.  Oxygen saturation on room air at rest was 88% and oxygen saturation on room air with ambulation was 83%.  Oxygen saturation while ambulating improved to 90% with 2 L of oxygen.  He was also given a prescription for a nebulizer, prednisone and albuterol solution at discharge.    Of note, patient was bradycardic with heart rate in the 40s.  However, he was asymptomatic.  He was on carvedilol from home.  Dose was decreased from 3.125 mg twice daily to 3.125 mg daily.  Close follow-up with PCP/cardiologist was recommended.  His condition has improved and he is deemed stable for discharge.  Discharge plan was discussed with the patient and his wife at the bedside.     Discharge Exam:   Vitals:   02/15/20 0725 02/15/20 0808  BP: (!) 144/64   Pulse: (!) 56   Resp: 16     Temp: (!) 97.4 F (36.3 C)   SpO2: 96% 96%   Vitals:   02/15/20 0221 02/15/20 0447 02/15/20 0725 02/15/20 0808  BP:   (!) 144/64   Pulse:   (!) 56   Resp:   16   Temp:   (!) 97.4 F (36.3 C)   TempSrc:   Oral   SpO2: 92%  96% 96%  Weight:  75.5 kg    Height:         GEN: NAD SKIN: No rash EYES: EOMI ENT: MMM CV: RRR PULM: CTA B ABD: soft, ND, NT, +BS CNS: AAO x 3, non focal EXT: No edema or tenderness   The results of significant diagnostics from this hospitalization (including imaging, microbiology, ancillary and laboratory) are listed below for reference.     Procedures and Diagnostic Studies:   DG Chest 2 View  Result Date: 02/13/2020 CLINICAL DATA:  Shortness of breath with cough and congestion. History of atrial fibrillation EXAM: CHEST - 2 VIEW COMPARISON:  Chest radiograph June 12, 2018 and chest CT August 18, 2019. FINDINGS: Lungs are mildly hyperexpanded. No edema or airspace opacity. Mild scarring left base region. Heart size and pulmonary vascularity are normal. Patient is status post coronary artery bypass grafting. There is aortic atherosclerosis. No adenopathy. There is old rib trauma on the right, healed. IMPRESSION: Lungs mildly hyperexpanded with slight scarring left base. No edema or airspace opacity. Stable cardiac silhouette. Status post coronary artery bypass grafting. Aortic Atherosclerosis (ICD10-I70.0). Electronically Signed   By: Gwyndolyn Saxon  Margarita Grizzle III M.D.   On: 02/13/2020 09:21     Labs:   Basic Metabolic Panel: Recent Labs  Lab 02/13/20 0844 02/14/20 0421  NA 140 141  K 4.1 4.6  CL 97* 102  CO2 32 33*  GLUCOSE 114* 157*  BUN 24* 25*  CREATININE 1.04 0.99  CALCIUM 9.1 9.0   GFR Estimated Creatinine Clearance: 64.4 mL/min (by C-G formula based on SCr of 0.99 mg/dL). Liver Function Tests: No results for input(s): AST, ALT, ALKPHOS, BILITOT, PROT, ALBUMIN in the last 168 hours. No results for input(s): LIPASE, AMYLASE in the  last 168 hours. No results for input(s): AMMONIA in the last 168 hours. Coagulation profile No results for input(s): INR, PROTIME in the last 168 hours.  CBC: Recent Labs  Lab 02/13/20 0844 02/14/20 0421  WBC 6.6 4.8  HGB 16.3 15.7  HCT 49.1 48.2  MCV 91.6 92.7  PLT 197 186   Cardiac Enzymes: No results for input(s): CKTOTAL, CKMB, CKMBINDEX, TROPONINI in the last 168 hours. BNP: Invalid input(s): POCBNP CBG: No results for input(s): GLUCAP in the last 168 hours. D-Dimer No results for input(s): DDIMER in the last 72 hours. Hgb A1c No results for input(s): HGBA1C in the last 72 hours. Lipid Profile No results for input(s): CHOL, HDL, LDLCALC, TRIG, CHOLHDL, LDLDIRECT in the last 72 hours. Thyroid function studies No results for input(s): TSH, T4TOTAL, T3FREE, THYROIDAB in the last 72 hours.  Invalid input(s): FREET3 Anemia work up No results for input(s): VITAMINB12, FOLATE, FERRITIN, TIBC, IRON, RETICCTPCT in the last 72 hours. Microbiology Recent Results (from the past 240 hour(s))  SARS Coronavirus 2 by RT PCR (hospital order, performed in [facility] hospital lab) Nasopharyngeal Nasopharyngeal Swab     Status: None   Collection Time: 02/13/20  2:19 PM   Specimen: Nasopharyngeal Swab  Result Value Ref Range Status   SARS Coronavirus 2 NEGATIVE NEGATIVE Final    Comment: (NOTE) SARS-CoV-2 target nucleic acids are NOT DETECTED. The SARS-CoV-2 RNA is generally detectable in upper and lower respiratory specimens during the acute phase of infection. The lowest concentration of SARS-CoV-2 viral copies this assay can detect is 250 copies / mL. A negative result does not preclude SARS-CoV-2 infection and should not be used as the sole basis for treatment or other patient management decisions.  A negative result may occur with improper specimen collection / handling, submission of specimen other than nasopharyngeal swab, presence of viral mutation(s) within the areas  targeted by this assay, and inadequate number of viral copies (<250 copies / mL). A negative result must be combined with clinical observations, patient history, and epidemiological information. Fact Sheet for Patients:   BoilerBrush.com.cy Fact Sheet for Healthcare Providers: https://pope.com/ This test is not yet approved or cleared  by the [place] FDA and has been authorized for detection and/or diagnosis of SARS-CoV-2 by FDA under an Emergency Use Authorization (EUA).  This EUA will remain in effect (meaning this test can be used) for the duration of the COVID-19 declaration under Section 564(b)(1) of the Act, 21 U.S.C. section 360bbb-3(b)(1), unless the authorization is terminated or revoked sooner. Performed at [facility], [address] [place], [place] [address]      Discharge Instructions:   Discharge Instructions    Diet - low sodium heart healthy   Complete by: As directed    Face-to-face encounter (required for Medicare/Medicaid patients)   Complete by: As directed    I Mantaj Chamberlin certify that this patient is under  my care and that I, or a nurse practitioner or physician's assistant working with me, had a face-to-face encounter that meets the physician face-to-face encounter requirements with this patient on 02/15/2020. The encounter with the patient was in whole, or in part for the following medical condition(s) which is the primary reason for home health care (List medical condition): COPD, chronic hypoxemic respiratory failure   The encounter with the patient was in whole, or in part, for the following medical condition, which is the primary reason for home health care: COPD, chronic hypoxemic respiratory failure   I certify that, based on my findings, the following services are medically necessary home health services:  Physical therapy Nursing     Reason for Medically Necessary Home Health Services:   Skilled Nursing- Change/Decline in Patient Status Therapy- Investment banker, operational, Transfer Training and Stair Training     My clinical findings support the need for the above services: Shortness of breath with activity   Further, I certify that my clinical findings support that this patient is homebound due to: Shortness of Breath with activity   For home use only DME Nebulizer machine   Complete by: As directed    Patient needs a nebulizer to treat with the following condition: COPD (chronic obstructive pulmonary disease) (HCC)   Length of Need: Lifetime   Home Health   Complete by: As directed    To provide the following care/treatments:  PT RN     Increase activity slowly   Complete by: As directed      Allergies as of 02/15/2020   No Known Allergies     Medication List    TAKE these medications   acetaminophen 500 MG tablet Commonly known as: TYLENOL Take 500 mg by mouth every 6 (six) hours as needed.   albuterol 108 (90 Base) MCG/ACT inhaler Commonly known as: VENTOLIN HFA Inhale 1-2 puffs into the lungs every 6 (six) hours as needed for wheezing or shortness of breath. What changed:   how much to take  how to take this  when to take this  reasons to take this   albuterol (2.5 MG/3ML) 0.083% nebulizer solution Commonly known as: PROVENTIL Take 3 mLs (2.5 mg total) by nebulization every 6 (six) hours as needed for wheezing or shortness of breath. What changed: You were already taking a medication with the same name, and this prescription was added. Make sure you understand how and when to take each.   atorvastatin 80 MG tablet Commonly known as: LIPITOR Take 1 tablet (80 mg total) by mouth daily at 6 PM.   carvedilol 3.125 MG tablet Commonly known as: COREG Take 1 tablet (3.125 mg total) by mouth daily. What changed: when to take this   clopidogrel 75 MG tablet Commonly known as: PLAVIX Take 75 mg by mouth every evening.   furosemide 40 MG tablet Commonly known  as: LASIX Take 40 mg by mouth 2 (two) times daily.   HYDROcodone-homatropine 5-1.5 MG/5ML syrup Commonly known as: HYCODAN Take 5 mLs by mouth every 8 (eight) hours as needed for cough.   predniSONE 20 MG tablet Commonly known as: DELTASONE Take 2 tablets (40 mg total) by mouth daily for 2 days.   sacubitril-valsartan 24-26 MG Commonly known as: ENTRESTO Take 1 tablet by mouth in the morning and at bedtime.   spironolactone 25 MG tablet Commonly known as: ALDACTONE Take 12.5 mg by mouth 2 (two) times daily.   Trelegy Ellipta 100-62.5-25 MCG/INH Aepb Generic drug: Fluticasone-Umeclidin-Vilant Inhale into  the lungs.            Durable Medical Equipment  (From admission, onward)         Start     Ordered   02/15/20 1206  DME Oxygen  Once    Question Answer Comment  Length of Need Lifetime   Mode or (Route) Nasal cannula   Liters per Minute 2   Frequency Continuous (stationary and portable oxygen unit needed)   Oxygen delivery system Gas      02/15/20 1212   02/15/20 0000  For home use only DME Nebulizer machine    Question Answer Comment  Patient needs a nebulizer to treat with the following condition COPD (chronic obstructive pulmonary disease) (HCC)   Length of Need Lifetime      02/15/20 1246            Time coordinating discharge: 32 minutes  Signed:  Makenzye Troutman  [facility] 02/15/2020, 12:46 PM

## 2020-02-15 NOTE — TOC Transition Note (Signed)
Transition of Care [facility]) - CM/SW Discharge Note   Patient Details  Name: MALEKAI MARKWOOD MRN: [id number] Date of Birth: 07-09-1951  [facility] CM/SW Contact:  Su Hilt, RN Phone Number: 02/15/2020, 12:10 PM   Clinical Narrative:     Met with the patient and his wife in the room to discuss DC plan and needs He has a scooter at home that he uses for trips to the store etc,  He agrees to home oxygen and I contacted Leroy Sea with [employer] to bring it to the room for DC today, The patient agrees to doing the Home Health COPD program with [facility], Corene Cornea is aware. NO additional needs  Final next level of care: Home w Home Health Services Barriers to Discharge: Barriers Resolved   Patient Goals and CMS Choice Patient states their goals for this hospitalization and ongoing recovery are:: go home      Discharge Placement                       Discharge Plan and Services   Discharge Planning Services: CM Consult            DME Arranged: Oxygen DME Agency: [employer] Date DME Agency Contacted: 02/15/20 Time DME Agency Contacted: 1209 Representative spoke with at DME Agency: [place]: PT, RN [facility] Agency: [place] ([place]) Date [place]: 02/15/20 Time [place]: 1209 Representative spoke with at [place]: [place] ([place]) Interventions     Readmission Risk Interventions No flowsheet data found.

## 2020-02-15 NOTE — Progress Notes (Signed)
SATURATION QUALIFICATIONS: (This note is used to comply with regulatory documentation for home oxygen)  Patient Saturations on Room Air at Rest = 88%  Patient Saturations on Room Air while Ambulating = 83%  Patient Saturations on 2 Liters of oxygen while Ambulating = 90%  Please briefly explain why patient needs home oxygen:  Pt ambulate around the hall x1 with walker stand-by-assist. Pt desat between 83%-85% RA. 2L started and sat run between 88%-90%.

## 2020-02-23 LAB — BLOOD GAS, VENOUS
Acid-Base Excess: 8.2 mmol/L — ABNORMAL HIGH (ref 0.0–2.0)
Bicarbonate: 37.1 mmol/L — ABNORMAL HIGH (ref 20.0–28.0)
O2 Saturation: 31.9 %
Patient temperature: 37
pCO2, Ven: 72 mmHg (ref 44.0–60.0)
pH, Ven: 7.32 (ref 7.250–7.430)

## 2020-03-01 ENCOUNTER — Other Ambulatory Visit: Payer: Self-pay

## 2020-03-01 ENCOUNTER — Ambulatory Visit (INDEPENDENT_AMBULATORY_CARE_PROVIDER_SITE_OTHER): Payer: Medicare Other | Admitting: Vascular Surgery

## 2020-03-01 ENCOUNTER — Encounter (INDEPENDENT_AMBULATORY_CARE_PROVIDER_SITE_OTHER): Payer: Self-pay | Admitting: Vascular Surgery

## 2020-03-01 VITALS — BP 130/86 | HR 62 | Ht 68.0 in | Wt 165.0 lb

## 2020-03-01 DIAGNOSIS — I872 Venous insufficiency (chronic) (peripheral): Secondary | ICD-10-CM | POA: Insufficient documentation

## 2020-03-01 DIAGNOSIS — I739 Peripheral vascular disease, unspecified: Secondary | ICD-10-CM

## 2020-03-01 DIAGNOSIS — I6523 Occlusion and stenosis of bilateral carotid arteries: Secondary | ICD-10-CM | POA: Diagnosis not present

## 2020-03-01 DIAGNOSIS — J449 Chronic obstructive pulmonary disease, unspecified: Secondary | ICD-10-CM

## 2020-03-01 DIAGNOSIS — I4811 Longstanding persistent atrial fibrillation: Secondary | ICD-10-CM

## 2020-03-01 DIAGNOSIS — I25118 Atherosclerotic heart disease of native coronary artery with other forms of angina pectoris: Secondary | ICD-10-CM | POA: Diagnosis not present

## 2020-03-01 NOTE — Progress Notes (Signed)
MRN : [id number]  Dennis Preston is a 69 y.o. (Feb 07, 1951) male who presents with chief complaint of  Chief Complaint  Patient presents with  . New Patient (Initial Visit)    LE edema   .  History of Present Illness:   The patient is seen for evaluation of painful lower extremities. Patient notes the pain is variable and not always associated with activity.  The pain is somewhat consistent day to day occurring on most days. The patient notes the pain also occurs with standing and routinely seems worse as the day wears on. The pain has been progressive over the past several years. The patient states these symptoms are causing  a profound negative impact on quality of life and daily activities.  The patient denies rest pain or dangling of an extremity off the side of the bed during the night for relief. No open wounds or sores at this time. No history of DVT or phlebitis. No prior interventions or surgeries.  There is a  history of back problems and DJD of the lumbar and sacral spine.    Current Meds  Medication Sig  . acetaminophen (TYLENOL) 500 MG tablet Take 500 mg by mouth every 6 (six) hours as needed.  Marland Kitchen acetaminophen (TYLENOL) 500 MG tablet Take by mouth.  Marland Kitchen albuterol (PROVENTIL) (2.5 MG/3ML) 0.083% nebulizer solution Take 3 mLs (2.5 mg total) by nebulization every 6 (six) hours as needed for wheezing or shortness of breath.  Marland Kitchen albuterol (VENTOLIN HFA) 108 (90 Base) MCG/ACT inhaler Inhale 1-2 puffs into the lungs every 6 (six) hours as needed for wheezing or shortness of breath.  Marland Kitchen atorvastatin (LIPITOR) 80 MG tablet Take 1 tablet (80 mg total) by mouth daily at 6 PM.  . carvedilol (COREG) 3.125 MG tablet Take 1 tablet (3.125 mg total) by mouth daily.  . clopidogrel (PLAVIX) 75 MG tablet Take 75 mg by mouth every evening.   . Fluticasone-Umeclidin-Vilant (TRELEGY ELLIPTA) 100-62.5-25 MCG/INH AEPB Inhale into the lungs.  . furosemide (LASIX) 40 MG tablet Take 40 mg by mouth 2  (two) times daily.   Marland Kitchen HYDROcodone-homatropine (HYCODAN) 5-1.5 MG/5ML syrup Take 5 mLs by mouth every 8 (eight) hours as needed for cough.  . sacubitril-valsartan (ENTRESTO) 24-26 MG Take 1 tablet by mouth in the morning and at bedtime.  Marland Kitchen spironolactone (ALDACTONE) 25 MG tablet Take 12.5 mg by mouth 2 (two) times daily.     Past Medical History:  Diagnosis Date  . Alcohol abuse   . Amaurosis fugax   . Atrial fibrillation (HCC)   . Carotid stenosis   . Coagulopathy (HCC)   . COPD (chronic obstructive pulmonary disease) (HCC)   . Coronary artery disease   . Depression   . Hyperlipemia   . Hypotension   . Leucocytosis   . Liver dysfunction   . Myocardial infarction (HCC)    non-STEMI. approx 2016  . Peripheral artery disease (HCC)    legs  . Tobacco abuse   . Wears dentures    full upper and lower    Past Surgical History:  Procedure Laterality Date  . CARDIAC CATHETERIZATION N/A 07/10/2015   Procedure: Right/Left Heart Cath and Coronary Angiography;  Surgeon: Alwyn Pea, MD;  Location: [facility];  Service: Cardiovascular;  Laterality: N/A;  . CORONARY ARTERY BYPASS GRAFT  07/13/2015   [facility].  4 vessel  . LEFT HEART CATH AND CORS/GRAFTS ANGIOGRAPHY N/A 01/23/2020   Procedure: LEFT HEART CATH AND CORS/GRAFTS ANGIOGRAPHY;  Surgeon: Alwyn Pea, MD;  Location: [facility];  Service: Cardiovascular;  Laterality: N/A;  . OLECRANON BURSECTOMY Left 05/06/2019   Procedure: OLECRANON BURSECTOMY AND DEBRIDEMENT;  Surgeon: Signa Kell, MD;  Location: [facility];  Service: Orthopedics;  Laterality: Left;    Social History Social History   Tobacco Use  . Smoking status: Current Every Day Smoker    Packs/day: 1.00    Years: 54.00    Pack years: 54.00    Types: Cigarettes  . Smokeless tobacco: Never Used  . Tobacco comment: was 2.5 PPD for 50 yrs until 2016  Substance Use Topics  . Alcohol use: Not Currently    Comment: quit over 15 yrs ago  . Drug  use: Never    Family History Family History  Problem Relation Age of Onset  . Depression Sister   No family history of bleeding/clotting disorders, porphyria or autoimmune disease   No Known Allergies    REVIEW OF SYSTEMS (Negative unless checked)  Constitutional: [] Weight loss  [] Fever  [] Chills Cardiac: [] Chest pain   [] Chest pressure   [] Palpitations   [] Shortness of breath when laying flat   [] Shortness of breath with exertion. Vascular:  [x] Pain in legs with walking   [x] Pain in legs at rest  [] History of DVT   [] Phlebitis   [] Swelling in legs   [] Varicose veins   [] Non-healing ulcers Pulmonary:   [] Uses home oxygen   [] Productive cough   [] Hemoptysis   [] Wheeze  [x] COPD   [] Asthma Neurologic:  [] Dizziness   [] Seizures   [] History of stroke   [] History of TIA  [] Aphasia   [] Vissual changes   [] Weakness or numbness in arm   [] Weakness or numbness in leg Musculoskeletal:   [] Joint swelling   [x] Joint pain   [] Low back pain Hematologic:  [] Easy bruising  [] Easy bleeding   [] Hypercoagulable state   [] Anemic Gastrointestinal:  [] Diarrhea   [] Vomiting  [] Gastroesophageal reflux/heartburn   [] Difficulty swallowing. Genitourinary:  [] Chronic kidney disease   [] Difficult urination  [] Frequent urination   [] Blood in urine Skin:  [] Rashes   [] Ulcers  Psychological:  [] History of anxiety   []  History of major depression.  Physical Examination  Vitals:   03/01/20 0847  BP: 130/86  Pulse: 62  Weight: 165 lb (74.8 kg)  Height: 5\' 8"  (1.727 m)   Body mass index is 25.09 kg/m. Gen: WD/WN, NAD Head: [place]/AT, No temporalis wasting.  Ear/Nose/Throat: Hearing grossly intact, nares w/o erythema or drainage, poor dentition Eyes: PER, EOMI, sclera nonicteric.  Neck: Supple, no masses.  No bruit or JVD.  Pulmonary:  Good air movement, clear to auscultation bilaterally, no use of accessory muscles.  Cardiac: RRR, normal S1, S2, no Murmurs. Vascular: scattered varicosities present bilaterally.   Moderate venous stasis changes to the legs bilaterally.  2+ soft pitting edema bilateral carotid bruits Vessel Right Left  Radial Palpable Palpable  Carotid Palpable Palpable  Popliteal Not Palpable Not Palpable  PT Not Palpable Not Palpable  DP Not Palpable Not Palpable  Gastrointestinal: soft, non-distended. No guarding/no peritoneal signs.  Musculoskeletal: M/S 5/5 throughout.  No deformity or atrophy.  Neurologic: CN 2-12 intact. Pain and light touch intact in extremities.  Symmetrical.  Speech is fluent. Motor exam as listed above. Psychiatric: Judgment intact, Mood & affect appropriate for pt's clinical situation. Dermatologic: venous rashes no ulcers noted.  No changes consistent with cellulitis.  CBC Lab Results  Component Value Date   WBC 4.8 02/14/2020   HGB 15.7 02/14/2020  HCT 48.2 02/14/2020   MCV 92.7 02/14/2020   PLT 186 02/14/2020    BMET    Component Value Date/Time   NA 141 02/14/2020 0421   K 4.6 02/14/2020 0421   CL 102 02/14/2020 0421   CO2 33 (H) 02/14/2020 0421   GLUCOSE 157 (H) 02/14/2020 0421   BUN 25 (H) 02/14/2020 0421   CREATININE 0.99 02/14/2020 0421   CALCIUM 9.0 02/14/2020 0421   GFRNONAA >60 02/14/2020 0421   GFRAA >60 02/14/2020 0421   Estimated Creatinine Clearance: 69.1 mL/min (by C-G formula based on SCr of 0.99 mg/dL).  COAG No results found for: INR, PROTIME  Radiology DG Chest 2 View  Result Date: 02/13/2020 CLINICAL DATA:  Shortness of breath with cough and congestion. History of atrial fibrillation EXAM: CHEST - 2 VIEW COMPARISON:  Chest radiograph June 12, 2018 and chest CT August 18, 2019. FINDINGS: Lungs are mildly hyperexpanded. No edema or airspace opacity. Mild scarring left base region. Heart size and pulmonary vascularity are normal. Patient is status post coronary artery bypass grafting. There is aortic atherosclerosis. No adenopathy. There is old rib trauma on the right, healed. IMPRESSION: Lungs mildly  hyperexpanded with slight scarring left base. No edema or airspace opacity. Stable cardiac silhouette. Status post coronary artery bypass grafting. Aortic Atherosclerosis (ICD10-I70.0). Electronically Signed   By: Lowella Grip III M.D.   On: 02/13/2020 09:21     Assessment/Plan 1. Chronic venous insufficiency No surgery or intervention at this point in time.    I have had a long discussion with the patient regarding venous insufficiency and why it  causes symptoms. I have discussed with the patient the chronic skin changes that accompany venous insufficiency and the long term sequela such as infection and ulceration.  Patient will begin wearing graduated compression stockings (15-20 mmHg) or compression wraps on a daily basis a prescription was given. The patient will put the stockings on first thing in the morning and removing them in the evening. The patient is instructed specifically not to sleep in the stockings.    In addition, behavioral modification including several periods of elevation of the lower extremities during the day will be continued. I have demonstrated that proper elevation is a position with the ankles at heart level.  The patient is instructed to begin routine exercise, especially walking on a daily basis   2. PAD (peripheral artery disease) (HCC)  Recommend:  Patient should undergo arterial duplex of the lower extremity ASAP because there has been a significant deterioration in the patient's lower extremity symptoms.  The patient states they are having increased pain and a marked decrease in the distance that they can walk.  The risks and benefits as well as the alternatives were discussed in detail with the patient.  All questions were answered.  Patient agrees to proceed and understands this could be a prelude to angiography and intervention.  The patient will follow up with me in the office to review the studies.  - VAS [place] ABI WITH/WO TBI; Future  3. Carotid  atherosclerosis, bilateral Recommend:  Given the patient's asymptomatic subcritical stenosis no further invasive testing or surgery at this time.  Duplex ultrasound is over due.  Continue antiplatelet therapy as prescribed Continue management of CAD, HTN and Hyperlipidemia Healthy heart diet,  encouraged exercise at least 4 times per week Follow up in 1 month with duplex ultrasound and physical exam  - VAS US CAROTID; Future  4. Coronary artery disease of native artery of native  heart with stable angina pectoris (HCC) Continue cardiac and antihypertensive medications as already ordered and reviewed, no changes at this time.  Continue statin as ordered and reviewed, no changes at this time  Nitrates PRN for chest pain   5. Longstanding persistent atrial fibrillation (HCC) Continue antiarrhythmia medications as already ordered, these medications have been reviewed and there are no changes at this time.  Continue anticoagulation as ordered by Cardiology Service   6. Chronic obstructive pulmonary disease, unspecified COPD type (HCC) Continue pulmonary medications and aerosols as already ordered, these medications have been reviewed and there are no changes at this time.   - Ambulatory referral to Pulmonology   Levora Dredge, MD  03/01/2020 12:07 PM

## 2020-03-23 ENCOUNTER — Ambulatory Visit (INDEPENDENT_AMBULATORY_CARE_PROVIDER_SITE_OTHER): Payer: Medicare Other | Admitting: Nurse Practitioner

## 2020-03-23 ENCOUNTER — Ambulatory Visit (INDEPENDENT_AMBULATORY_CARE_PROVIDER_SITE_OTHER): Payer: Medicare Other

## 2020-03-23 ENCOUNTER — Other Ambulatory Visit: Payer: Self-pay

## 2020-03-23 ENCOUNTER — Encounter (INDEPENDENT_AMBULATORY_CARE_PROVIDER_SITE_OTHER): Payer: Self-pay | Admitting: Nurse Practitioner

## 2020-03-23 VITALS — BP 153/63 | HR 52 | Resp 16 | Wt 169.6 lb

## 2020-03-23 DIAGNOSIS — I739 Peripheral vascular disease, unspecified: Secondary | ICD-10-CM

## 2020-03-23 DIAGNOSIS — I6523 Occlusion and stenosis of bilateral carotid arteries: Secondary | ICD-10-CM

## 2020-03-23 DIAGNOSIS — Z720 Tobacco use: Secondary | ICD-10-CM | POA: Diagnosis not present

## 2020-03-26 ENCOUNTER — Encounter (INDEPENDENT_AMBULATORY_CARE_PROVIDER_SITE_OTHER): Payer: Self-pay | Admitting: Nurse Practitioner

## 2020-03-26 ENCOUNTER — Telehealth (INDEPENDENT_AMBULATORY_CARE_PROVIDER_SITE_OTHER): Payer: Self-pay

## 2020-03-26 NOTE — Progress Notes (Signed)
Subjective:    Patient ID: Dennis Preston, male    DOB: 12/02/1950, 69 y.o.   MRN: [id number] Chief Complaint  Patient presents with  . Follow-up    ultrasound follow up    The patient returns to the office for followup and review of the noninvasive studies. There has been a significant deterioration in the lower extremity symptoms.  The patient notes interval shortening of their claudication distance and development of mild rest pain symptoms. No new ulcers or wounds have occurred since the last visit.  The patient recently had a heart catheterization approximately 1 month ago.  The patient had an NSTEMI and it was ultimately decided that the patient will be treated with medical management.  The patient denies amaurosis fugax or recent TIA symptoms. There are no recent neurological changes noted. The patient denies history of DVT, PE or superficial thrombophlebitis. The patient denies recent episodes of angina or shortness of breath.   ABI's Rt=0.53 and Lt=0.54 (no previous ABIs) Duplex US of the lower extremity arterial system shows monophasic tibial artery waveforms with slightly dampened toe waveforms.  The patient also has a previous history of carotid artery stenosis.  Currently he denies any TIA or strokelike symptoms.  He denies any arthrosis ejects or any other visual changes.  However it was noted that on a CT scan in 2017 the patient had severe left subclavian stenosis with 60% stenosis of the right ICA with 50% stenosis of the left ICA.  Today noninvasive studies show velocities consistent with a 80 to 99% stenosis in the bilateral internal carotid arteries.  The plaque is shown to be calcified and irregular making velocity estimation somewhat difficult.   Review of Systems  Cardiovascular:       Rest pain  All other systems reviewed and are negative.      Objective:   Physical Exam Vitals reviewed.  HENT:     Head: Normocephalic.  Neck:     Vascular: No carotid  bruit.  Cardiovascular:     Rate and Rhythm: Normal rate and regular rhythm.     Pulses: Decreased pulses.     Heart sounds: Normal heart sounds.  Pulmonary:     Effort: Pulmonary effort is normal.     Breath sounds: Normal breath sounds.  Skin:    General: Skin is cool.  Neurological:     Mental Status: He is alert and oriented to person, place, and time.  Psychiatric:        Mood and Affect: Mood normal.        Behavior: Behavior normal.        Thought Content: Thought content normal.        Judgment: Judgment normal.     BP (!) 153/63 (BP Location: Right Arm)   Pulse (!) 52   Resp 16   Wt 169 lb 9.6 oz (76.9 kg)   BMI 25.79 kg/m   Past Medical History:  Diagnosis Date  . Alcohol abuse   . Amaurosis fugax   . Atrial fibrillation (HCC)   . Carotid stenosis   . Coagulopathy (HCC)   . COPD (chronic obstructive pulmonary disease) (HCC)   . Coronary artery disease   . Depression   . Hyperlipemia   . Hypotension   . Leucocytosis   . Liver dysfunction   . Myocardial infarction (HCC)    non-STEMI. approx 2016  . Peripheral artery disease (HCC)    legs  . Tobacco abuse   . Wears dentures  full upper and lower    Social History   Socioeconomic History  . Marital status: Married    Spouse name: Not on file  . Number of children: Not on file  . Years of education: Not on file  . Highest education level: Not on file  Occupational History  . Not on file  Tobacco Use  . Smoking status: Current Every Day Smoker    Packs/day: 1.00    Years: 54.00    Pack years: 54.00    Types: Cigarettes  . Smokeless tobacco: Never Used  . Tobacco comment: was 2.5 PPD for 50 yrs until 2016  Vaping Use  . Vaping Use: Never used  Substance and Sexual Activity  . Alcohol use: Not Currently    Comment: quit over 15 yrs ago  . Drug use: Never  . Sexual activity: Not on file  Other Topics Concern  . Not on file  Social History Narrative  . Not on file   Social Determinants  of Health   Financial Resource Strain:   . Difficulty of Paying Living Expenses:   Food Insecurity:   . Worried About Programme researcher, broadcasting/film/video in the Last Year:   . Barista in the Last Year:   Transportation Needs:   . Freight forwarder (Medical):   Marland Kitchen Lack of Transportation (Non-Medical):   Physical Activity:   . Days of Exercise per Week:   . Minutes of Exercise per Session:   Stress:   . Feeling of Stress :   Social Connections:   . Frequency of Communication with Friends and Family:   . Frequency of Social Gatherings with Friends and Family:   . Attends Religious Services:   . Active Member of Clubs or Organizations:   . Attends Banker Meetings:   Marland Kitchen Marital Status:   Intimate Partner Violence:   . Fear of Current or Ex-Partner:   . Emotionally Abused:   Marland Kitchen Physically Abused:   . Sexually Abused:     Past Surgical History:  Procedure Laterality Date  . CARDIAC CATHETERIZATION N/A 07/10/2015   Procedure: Right/Left Heart Cath and Coronary Angiography;  Surgeon: Alwyn Pea, MD;  Location: [facility];  Service: Cardiovascular;  Laterality: N/A;  . CORONARY ARTERY BYPASS GRAFT  07/13/2015   [facility].  4 vessel  . LEFT HEART CATH AND CORS/GRAFTS ANGIOGRAPHY N/A 01/23/2020   Procedure: LEFT HEART CATH AND CORS/GRAFTS ANGIOGRAPHY;  Surgeon: Alwyn Pea, MD;  Location: [facility];  Service: Cardiovascular;  Laterality: N/A;  . OLECRANON BURSECTOMY Left 05/06/2019   Procedure: OLECRANON BURSECTOMY AND DEBRIDEMENT;  Surgeon: Signa Kell, MD;  Location: [facility];  Service: Orthopedics;  Laterality: Left;    Family History  Problem Relation Age of Onset  . Depression Sister     No Known Allergies     Assessment & Plan:  1. PAD (peripheral artery disease) (HCC) Recommend:  The patient has evidence of severe atherosclerotic changes of both lower extremities with rest pain that is associated with preulcerative changes and  impending tissue loss of the foot.  This represents a limb threatening ischemia and places the patient at the risk for limb loss.  Patient should undergo angiography of the lower extremities with the hope for intervention for limb salvage.  The risks and benefits as well as the alternative therapies was discussed in detail with the patient.  All questions were answered.  Patient agrees to proceed with angiography.  The  patient will follow up with me in the office after the procedure.       2. Carotid atherosclerosis, bilateral The patient remains asymptomatic with respect to the carotid stenosis.  However, the patient has now progressed and has a lesion the is >70%.  Patient should undergo CT angiography of the carotid arteries to define the degree of stenosis of the internal carotid arteries bilaterally and the anatomic suitability for surgery vs. intervention.  We will proceed with CT approximately 1 to 2 weeks after patient's second lower extremity angiogram  If the patient does indeed need surgery cardiac clearance will be required, once cleared the patient will be scheduled for surgery.  The risks, benefits and alternative therapies were reviewed in detail with the patient.  All questions were answered.  The patient agrees to proceed with imaging.  Continue antiplatelet therapy as prescribed. Continue management of CAD, HTN and Hyperlipidemia. Healthy heart diet, encouraged exercise at least 4 times per week.   - CT ANGIO NECK W OR WO CONTRAST; Future - CT ANGIO HEAD W OR WO CONTRAST; Future - Creatinine, IStat  3. Tobacco use Patient notes that he has stopped approximately a month ago.  Currently working to maintain abstinence.    Current Outpatient Medications on File Prior to Visit  Medication Sig Dispense Refill  . acetaminophen (TYLENOL) 500 MG tablet Take 500 mg by mouth every 6 (six) hours as needed.    Marland Kitchen acetaminophen (TYLENOL) 500 MG tablet Take by mouth.    Marland Kitchen  albuterol (PROVENTIL) (2.5 MG/3ML) 0.083% nebulizer solution Take 3 mLs (2.5 mg total) by nebulization every 6 (six) hours as needed for wheezing or shortness of breath. 360 mL 0  . albuterol (VENTOLIN HFA) 108 (90 Base) MCG/ACT inhaler Inhale 1-2 puffs into the lungs every 6 (six) hours as needed for wheezing or shortness of breath. 8 g 0  . atorvastatin (LIPITOR) 80 MG tablet Take 1 tablet (80 mg total) by mouth daily at 6 PM.    . clopidogrel (PLAVIX) 75 MG tablet Take 75 mg by mouth every evening.     . Fluticasone-Umeclidin-Vilant (TRELEGY ELLIPTA) 100-62.5-25 MCG/INH AEPB Inhale into the lungs.    . furosemide (LASIX) 40 MG tablet Take 40 mg by mouth 2 (two) times daily.     Marland Kitchen HYDROcodone-homatropine (HYCODAN) 5-1.5 MG/5ML syrup Take 5 mLs by mouth every 8 (eight) hours as needed for cough. 120 mL 0  . sacubitril-valsartan (ENTRESTO) 24-26 MG Take 1 tablet by mouth in the morning and at bedtime.    . carvedilol (COREG) 3.125 MG tablet Take 1 tablet (3.125 mg total) by mouth daily. (Patient not taking: Reported on 03/23/2020)    . spironolactone (ALDACTONE) 25 MG tablet Take 12.5 mg by mouth 2 (two) times daily.  (Patient not taking: Reported on 03/23/2020)     No current facility-administered medications on file prior to visit.    There are no Patient Instructions on file for this visit. No follow-ups on file.   Kris Hartmann, NP

## 2020-03-26 NOTE — Telephone Encounter (Signed)
Spoke with the patient and he is scheduled with Dr. Gilda Crease for bilateral leg angiograms. Right leg on 04/03/20 with a 9:00 am arrival time to the [facility]. Left leg angio on 04/10/20 with a 6:45 am arrival time to the [facility]. Patient will do a covid test on 03/30/20 between 8-1 pm at the [facility]. Pre-procedure instructions were discussed and will be mailed to the patient. Patient is do to a CT one to two weeks after his left leg angio on 04/10/20. Patient was given this information as well.

## 2020-03-27 ENCOUNTER — Other Ambulatory Visit (INDEPENDENT_AMBULATORY_CARE_PROVIDER_SITE_OTHER): Payer: Self-pay | Admitting: Nurse Practitioner

## 2020-03-30 ENCOUNTER — Other Ambulatory Visit: Payer: Self-pay

## 2020-03-30 ENCOUNTER — Other Ambulatory Visit
Admission: RE | Admit: 2020-03-30 | Discharge: 2020-03-30 | Disposition: A | Discharge status: Home / Self Care | Payer: Medicare Other | Source: Ambulatory Visit | Attending: Vascular Surgery | Admitting: Vascular Surgery

## 2020-03-30 DIAGNOSIS — Z20822 Contact with and (suspected) exposure to covid-19: Secondary | ICD-10-CM | POA: Diagnosis not present

## 2020-03-30 DIAGNOSIS — Z01812 Encounter for preprocedural laboratory examination: Secondary | ICD-10-CM | POA: Insufficient documentation

## 2020-03-31 LAB — SARS CORONAVIRUS 2 (TAT 6-24 HRS): SARS Coronavirus 2: NEGATIVE

## 2020-04-03 ENCOUNTER — Ambulatory Visit
Admission: RE | Admit: 2020-04-03 | Discharge: 2020-04-03 | Disposition: A | Discharge status: Home / Self Care | Payer: Medicare Other | Attending: Vascular Surgery | Admitting: Vascular Surgery

## 2020-04-03 ENCOUNTER — Other Ambulatory Visit: Payer: Self-pay

## 2020-04-03 ENCOUNTER — Encounter
Admission: RE | Disposition: A | Discharge status: Home / Self Care | Payer: Self-pay | Source: Home / Self Care | Attending: Vascular Surgery

## 2020-04-03 ENCOUNTER — Encounter: Payer: Self-pay | Admitting: Vascular Surgery

## 2020-04-03 DIAGNOSIS — J449 Chronic obstructive pulmonary disease, unspecified: Secondary | ICD-10-CM | POA: Diagnosis not present

## 2020-04-03 DIAGNOSIS — I4891 Unspecified atrial fibrillation: Secondary | ICD-10-CM | POA: Insufficient documentation

## 2020-04-03 DIAGNOSIS — I2510 Atherosclerotic heart disease of native coronary artery without angina pectoris: Secondary | ICD-10-CM | POA: Insufficient documentation

## 2020-04-03 DIAGNOSIS — I10 Essential (primary) hypertension: Secondary | ICD-10-CM | POA: Insufficient documentation

## 2020-04-03 DIAGNOSIS — I252 Old myocardial infarction: Secondary | ICD-10-CM | POA: Diagnosis not present

## 2020-04-03 DIAGNOSIS — E785 Hyperlipidemia, unspecified: Secondary | ICD-10-CM | POA: Insufficient documentation

## 2020-04-03 DIAGNOSIS — Z79899 Other long term (current) drug therapy: Secondary | ICD-10-CM | POA: Diagnosis not present

## 2020-04-03 DIAGNOSIS — F329 Major depressive disorder, single episode, unspecified: Secondary | ICD-10-CM | POA: Insufficient documentation

## 2020-04-03 DIAGNOSIS — I6523 Occlusion and stenosis of bilateral carotid arteries: Secondary | ICD-10-CM | POA: Diagnosis not present

## 2020-04-03 DIAGNOSIS — I70223 Atherosclerosis of native arteries of extremities with rest pain, bilateral legs: Secondary | ICD-10-CM | POA: Insufficient documentation

## 2020-04-03 DIAGNOSIS — Z7902 Long term (current) use of antithrombotics/antiplatelets: Secondary | ICD-10-CM | POA: Diagnosis not present

## 2020-04-03 DIAGNOSIS — I70229 Atherosclerosis of native arteries of extremities with rest pain, unspecified extremity: Secondary | ICD-10-CM

## 2020-04-03 DIAGNOSIS — Z720 Tobacco use: Secondary | ICD-10-CM | POA: Insufficient documentation

## 2020-04-03 HISTORY — PX: LOWER EXTREMITY ANGIOGRAPHY: CATH118251

## 2020-04-03 LAB — CREATININE, SERUM
Creatinine, Ser: 0.83 mg/dL (ref 0.61–1.24)
GFR calc Af Amer: >60 mL/min (ref >60)
GFR calc non Af Amer: >60 mL/min (ref >60)

## 2020-04-03 LAB — BUN: BUN: 12 mg/dL (ref 8–23)

## 2020-04-03 SURGERY — LOWER EXTREMITY ANGIOGRAPHY
Anesthesia: Moderate Sedation | Laterality: Right

## 2020-04-03 MED ORDER — DIPHENHYDRAMINE HCL 50 MG/ML IJ SOLN: 50.0000 mg | Freq: Once | INTRAMUSCULAR | Status: DC | PRN

## 2020-04-03 MED ORDER — METHYLPREDNISOLONE SODIUM SUCC 125 MG IJ SOLR: 125.0000 mg | Freq: Once | INTRAMUSCULAR | Status: DC | PRN

## 2020-04-03 MED ORDER — MIDAZOLAM HCL 2 MG/2ML IJ SOLN
INTRAMUSCULAR | Status: DC | PRN
  Administered 2020-04-03: 2 mg via INTRAVENOUS
  Administered 2020-04-03: 1 mg via INTRAVENOUS

## 2020-04-03 MED ORDER — ASPIRIN EC 81 MG PO TBEC: 81.0000 mg | DELAYED_RELEASE_TABLET | Freq: Every day | ORAL | 1 refills | Status: AC

## 2020-04-03 MED ORDER — HYDROMORPHONE HCL 1 MG/ML IJ SOLN: 1.0000 mg | Freq: Once | INTRAMUSCULAR | Status: DC | PRN

## 2020-04-03 MED ORDER — MIDAZOLAM HCL 2 MG/ML PO SYRP: 8.0000 mg | ORAL_SOLUTION | Freq: Once | ORAL | Status: DC | PRN

## 2020-04-03 MED ORDER — SODIUM CHLORIDE 0.9 % IV SOLN: 250.0000 mL | INTRAVENOUS | Status: DC | PRN

## 2020-04-03 MED ORDER — SODIUM CHLORIDE 0.9 % IV SOLN: INTRAVENOUS | Status: DC

## 2020-04-03 MED ORDER — SODIUM CHLORIDE 0.9% FLUSH: 3.0000 mL | Freq: Two times a day (BID) | INTRAVENOUS | Status: DC

## 2020-04-03 MED ORDER — DIPHENHYDRAMINE HCL 50 MG/ML IJ SOLN
INTRAMUSCULAR | Status: DC | PRN
  Administered 2020-04-03: 25 mg via INTRAVENOUS

## 2020-04-03 MED ORDER — CEFAZOLIN SODIUM-DEXTROSE 2-4 GM/100ML-% IV SOLN
2.0000 g | Freq: Once | INTRAVENOUS | Status: AC
  Administered 2020-04-03: 2 g via INTRAVENOUS

## 2020-04-03 MED ORDER — OXYCODONE HCL 5 MG PO TABS: 5.0000 mg | ORAL_TABLET | ORAL | Status: DC | PRN

## 2020-04-03 MED ORDER — HEPARIN SODIUM (PORCINE) 1000 UNIT/ML IJ SOLN
INTRAMUSCULAR | Status: AC
  Filled 2020-04-03: qty 1

## 2020-04-03 MED ORDER — FENTANYL CITRATE (PF) 100 MCG/2ML IJ SOLN
INTRAMUSCULAR | Status: AC
  Filled 2020-04-03: qty 2

## 2020-04-03 MED ORDER — FENTANYL CITRATE (PF) 100 MCG/2ML IJ SOLN
INTRAMUSCULAR | Status: DC | PRN
  Administered 2020-04-03: 50 ug via INTRAVENOUS
  Administered 2020-04-03: 25 ug via INTRAVENOUS

## 2020-04-03 MED ORDER — ASPIRIN 325 MG PO TABS
325.0000 mg | ORAL_TABLET | ORAL | Status: DC
  Filled 2020-04-03: qty 1

## 2020-04-03 MED ORDER — MIDAZOLAM HCL 5 MG/5ML IJ SOLN
INTRAMUSCULAR | Status: AC
  Filled 2020-04-03: qty 5

## 2020-04-03 MED ORDER — ACETAMINOPHEN 325 MG PO TABS: 650.0000 mg | ORAL_TABLET | ORAL | Status: DC | PRN

## 2020-04-03 MED ORDER — ONDANSETRON HCL 4 MG/2ML IJ SOLN: 4.0000 mg | Freq: Four times a day (QID) | INTRAMUSCULAR | Status: DC | PRN

## 2020-04-03 MED ORDER — FAMOTIDINE 20 MG PO TABS: 40.0000 mg | ORAL_TABLET | Freq: Once | ORAL | Status: DC | PRN

## 2020-04-03 MED ORDER — MORPHINE SULFATE (PF) 4 MG/ML IV SOLN: 2.0000 mg | INTRAVENOUS | Status: DC | PRN

## 2020-04-03 MED ORDER — HEPARIN SODIUM (PORCINE) 1000 UNIT/ML IJ SOLN
INTRAMUSCULAR | Status: DC | PRN
  Administered 2020-04-03: 4000 [IU] via INTRAVENOUS

## 2020-04-03 MED ORDER — DIPHENHYDRAMINE HCL 50 MG/ML IJ SOLN
INTRAMUSCULAR | Status: AC
  Filled 2020-04-03: qty 1

## 2020-04-03 MED ORDER — LABETALOL HCL 5 MG/ML IV SOLN: 10.0000 mg | INTRAVENOUS | Status: DC | PRN

## 2020-04-03 MED ORDER — HYDRALAZINE HCL 20 MG/ML IJ SOLN: 5.0000 mg | INTRAMUSCULAR | Status: DC | PRN

## 2020-04-03 MED ORDER — SODIUM CHLORIDE 0.9% FLUSH: 3.0000 mL | INTRAVENOUS | Status: DC | PRN

## 2020-04-03 SURGICAL SUPPLY — 17 items
BALLN LUTONIX DCB 5X60X130 (BALLOONS) ×2
BALLOON LUTONIX DCB 5X60X130 (BALLOONS) IMPLANT
CATH ANGIO 5F PIGTAIL 65CM (CATHETERS) ×1 IMPLANT
DEVICE PRESTO INFLATION (MISCELLANEOUS) ×2 IMPLANT
DEVICE SAFEGUARD 24CM (GAUZE/BANDAGES/DRESSINGS) ×2 IMPLANT
DEVICE STARCLOSE SE CLOSURE (Vascular Products) ×1 IMPLANT
GLIDEWIRE ADV .035X260CM (WIRE) ×2 IMPLANT
NDL ENTRY 21GA 7CM ECHOTIP (NEEDLE) IMPLANT
NEEDLE ENTRY 21GA 7CM ECHOTIP (NEEDLE) ×2 IMPLANT
PACK ANGIOGRAPHY (CUSTOM PROCEDURE TRAY) ×1 IMPLANT
SET INTRO CAPELLA COAXIAL (SET/KITS/TRAYS/PACK) ×2 IMPLANT
SHEATH ANL2 6FRX45 HC (SHEATH) ×1 IMPLANT
SHEATH BRITE TIP 5FRX11 (SHEATH) ×1 IMPLANT
SHEATH BRITE TIP 6FRX11 (SHEATH) ×2 IMPLANT
SYR MEDRAD MARK 7 150ML (SYRINGE) ×2 IMPLANT
TUBING CONTRAST HIGH PRESS 72 (TUBING) ×1 IMPLANT
WIRE J 3MM .035X145CM (WIRE) ×1 IMPLANT

## 2020-04-03 NOTE — Op Note (Signed)
[facility] Percutaneous Study/Intervention Procedural Note   Date of Surgery: 04/03/2020  Surgeon:  Renford Dills, MD.  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 Introduction catheter into right lower extremity 3rd order catheter placement  2. Contrast injection bilateral lower extremity for distal runoff   3. Percutaneous transluminal angioplasty right profunda femoris artery  4. Star close closure left common femoral arteriotomy  Anesthesia: Conscious sedation was administered under my direct supervision by the interventional radiology RN. IV Versed plus fentanyl were utilized. Continuous ECG, pulse oximetry and blood pressure was monitored throughout the entire procedure.  Conscious sedation was for a total of 50 minutes.  Sheath: 6 [place] Ansell left common femoral retrograde  Contrast: 80 cc  Fluoroscopy Time: 5.2 minutes  Indications: [facility] presents with increasing pain of both feet.  Physical examination as well as noninvasive studies suggest severe atherosclerotic occlusive disease.  He is complaining of the right foot more so than the left.  He presents to [facility] today for angiography and possible intervention of the right lower extremity.  The left lower extremity will be addressed at a later date.  The risks and benefits are reviewed all questions answered patient agrees to proceed.  Procedure: [facility] is a 69 y.o. y.o. male who was identified and appropriate procedural time out was performed. The patient was then placed supine on the table and prepped and draped in the usual sterile fashion.   Ultrasound was placed in the sterile sleeve and the left groin was evaluated the left common femoral artery was echolucent and  pulsatile indicating patency.  Image was recorded for the permanent record and under real-time visualization a microneedle was inserted into the common femoral artery microwire followed by a micro-sheath.  A J-wire was then advanced through the micro-sheath and a  5 [place] sheath was then inserted over a J-wire. J-wire was then advanced and a 5 French pigtail catheter was positioned at the level of T12. AP projection of the aorta was then obtained. Pigtail catheter was repositioned to above the bifurcation and a LAO view of the pelvis was obtained.  Subsequently a pigtail catheter with the advantage Glidewire was used to cross the aortic bifurcation the catheter wire were advanced down into the right distal external iliac artery. Oblique view of the femoral bifurcation was then obtained and subsequently the wire was reintroduced and the pigtail catheter negotiated into the SFA representing third order catheter placement. Distal runoff was then performed.  Later in the case hand-injection contrast through the femoral sheath was performed for left lower extremity angiography.  Diagnostic interpretation: The abdominal aorta is opacified with a bolus injection of contrast.  There is diffuse calcific disease but there are no hemodynamically significant stenoses.  Aortic bifurcation and bilateral common iliac arteries are patent and free of hemodynamically significant stenosis.  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  Left renal artery appears to be patent.  The right external iliac artery demonstrates a 30 to 40% smooth narrowing in the proximal one third.  The distal two thirds is free of hemodynamically significant stenosis.  The common femoral on the right is heavily diseased and the atherosclerotic occlusive disease extends into the profunda femoris.  There is a greater than 90% stenosis in the profunda femoris approximately 3 cm from the origin.  There is a flush occlusion of the right SFA with reconstitution at the femoral condyles.  There is diffuse disease with greater than 70% stenosis extending into the above-knee and at knee popliteal.  I see no evidence of a cul-de-sac or any other indication as to the true origin of the SFA.  The trifurcation is patent and the patient appears to have three-vessel runoff to the foot with intact posterior tibial and anterior tibial artery.  The left external iliac artery demonstrates diffuse disease with areas of approximately 60% stenosis.  The common femoral is heavily diseased with an area of greater than 70% stenosis involving the origin of the profunda femoris.  Again similar to the right side there is a flush occlusion of the SFA there is absolutely no evidence of the location of the origin.  There is reconstitution of the distal SFA and above-knee popliteal which then fills a patent trifurcation.  Distal imaging is not as densely opacified as it was on the right but there does appear to be three-vessel runoff to the foot.  There is an aberrant takeoff of the anterior tibial in the mid popliteal  Based on these images I elected to treat the profunda femoris on the right in hopes that this would improve his perfusion and eliminate rest pain symptoms.  5000 units of heparin was then given and allowed to circulate and a 6 [place] Ansell sheath was advanced up and over the bifurcation and positioned in the femoral artery  Pigtail catheter and advantage Glidewire were then negotiated down into the distal profunda femoris.  Imaging was then completed by hand injection through the catheter. The wire was then reintroduced and a 5 mm x 60 mm Lutonix balloon was used to angioplasty the proximal profunda femoris. Inflation was to 8 atmospheres for 1 minute follow-up imaging demonstrated patency with less than 10% residual stenosis. Distal runoff was then reassessed and found to be unchanged.  After review of these  images the sheath is exchanged for an 11 cm 6 French Pinnacle sheath and hand-injection of contrast was used to create distal runoff on the left.  Findings are as noted above.  External iliac oblique of the common femoral is obtained and a Star close device deployed. There no immediate complications.   Findings:  The abdominal aorta is opacified with a bolus injection of contrast.  There is diffuse calcific disease but there are no hemodynamically significant stenoses.  Aortic bifurcation and bilateral common iliac arteries are patent and free of hemodynamically significant stenosis.  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  Left renal artery appears to be patent.  The right external iliac artery demonstrates a 30 to 40% smooth narrowing in the proximal one third.  The distal two thirds is free of hemodynamically significant stenosis.  The common femoral on the right is heavily diseased and the atherosclerotic occlusive disease extends into the profunda femoris.  There is a greater than 90% stenosis in the profunda femoris approximately 3 cm from the origin.  There is a flush occlusion of the right SFA with reconstitution at the femoral condyles.  There is diffuse disease with greater than 70% stenosis extending into the above-knee and at knee popliteal.  I see no evidence of a cul-de-sac or any other indication as to the true origin of the SFA.  The trifurcation is patent and the patient appears to have three-vessel runoff to the foot with intact  posterior tibial and anterior tibial artery.  The left external iliac artery demonstrates diffuse disease with areas of approximately 60% stenosis.  The common femoral is heavily diseased with an area of greater than 70% stenosis involving the origin of the profunda femoris.  Again similar to the right side there is a flush occlusion of the SFA there is absolutely no evidence of the location of the  origin.  There is reconstitution of the distal SFA and above-knee popliteal which then fills a patent trifurcation.  Distal imaging is not as densely opacified as it was on the right but there does appear to be three-vessel runoff to the foot.  There is an aberrant takeoff of the anterior tibial in the mid popliteal  Following angioplasty to 5 mm with a Lutonix drug-eluting balloon in the right profunda femoris is now patent with in-line flow and looks quite nice.  There is less than 10% residual stenosis    Summary: Successful recanalization right lower extremity for limb salvage.  However, if his SFA needs to be addressed because of persistent pain I would recommend bilateral femoral endarterectomies with revascularization of the occluded superficial femoral artery as a hybrid procedure in the operating room    Disposition: Patient was taken to the recovery room in stable condition having tolerated the procedure well.  [facility], [facility] 04/03/2020,12:11 PM

## 2020-04-03 NOTE — H&P (Signed)
[facility] History & Physical Update  The patient was interviewed and re-examined.  The patient's previous History and Physical has been reviewed and is unchanged.  There is no change in the plan of care. We plan to proceed with the scheduled procedure.  Levora Dredge, MD  04/03/2020, 10:54 AM

## 2020-04-03 NOTE — Discharge Instructions (Signed)
Aspirin, ASA oral tablets What is this medicine? ASPIRIN (AS pir in) is a pain reliever. It is used to treat mild pain and fever. This medicine is also used as directed by a doctor to prevent and to treat heart attacks, to prevent strokes and blood clots, and to treat arthritis or inflammation. This medicine may be used for other purposes; ask your health care provider or pharmacist if you have questions. COMMON BRAND NAME(S): Aspir-Low, Aspir-Trin, Aspirtab, Bayer Advanced Aspirin, Bayer Aspirin, Bayer Aspirin Extra Strength, Bayer Aspirin Plus, Nature conservation officer, Nature conservation officer Plus, Bayer Genuine Aspirin, Bayer Womens Aspirin, Bufferin, Bufferin Extra Strength, Bufferin Low Dose What should I tell my health care provider before I take this medicine? They need to know if you have any of these conditions:  anemia  asthma  bleeding problems  child with chickenpox, the flu, or other viral infection  diabetes  gout  if you frequently drink alcohol containing drinks  kidney disease  liver disease  low level of vitamin K  lupus  smoke tobacco  stomach ulcers or other problems  an unusual or allergic reaction to aspirin, tartrazine dye, other medicines, dyes, or preservatives  pregnant or trying to get pregnant  breast-feeding How should I use this medicine? Take this medicine by mouth with a glass of water. Follow the directions on the package or prescription label. You can take this medicine with or without food. If it upsets your stomach, take it with food. Do not take your medicine more often than directed. Talk to your pediatrician regarding the use of this medicine in children. While this drug may be prescribed for children as young as 47 years of age for selected conditions, precautions do apply. Children and teenagers should not use this medicine to treat chicken pox or flu symptoms unless directed by a doctor. Patients over 97 years old may have a stronger  reaction and need a smaller dose. Overdosage: If you think you have taken too much of this medicine contact a poison control center or emergency room at once. NOTE: This medicine is only for you. Do not share this medicine with others. What if I miss a dose? If you are taking this medicine on a regular schedule and miss a dose, take it as soon as you can. If it is almost time for your next dose, take only that dose. Do not take double or extra doses. What may interact with this medicine? Do not take this medicine with any of the following medications:  cidofovir  ketorolac  probenecid This medicine may also interact with the following medications:  alcohol  alendronate  bismuth subsalicylate  flavocoxid  herbal supplements like feverfew, garlic, ginger, ginkgo biloba, horse chestnut  medicines for diabetes or glaucoma like acetazolamide, methazolamide  medicines for gout  medicines that treat or prevent blood clots like enoxaparin, heparin, ticlopidine, warfarin  other aspirin and aspirin-like medicines  NSAIDs, medicines for pain and inflammation, like ibuprofen or naproxen  pemetrexed  sulfinpyrazone  varicella live vaccine This list may not describe all possible interactions. Give your health care provider a list of all the medicines, herbs, non-prescription drugs, or dietary supplements you use. Also tell them if you smoke, drink alcohol, or use illegal drugs. Some items may interact with your medicine. What should I watch for while using this medicine? If you are treating yourself for pain, tell your doctor or health care professional if the pain lasts more than 10 days, if it gets worse, or  if there is a new or different kind of pain. Tell your doctor if you see redness or swelling. Also, check with your doctor if you have a fever that lasts for more than 3 days. Only take this medicine to prevent heart attacks or blood clotting if prescribed by your doctor or health  care professional. Do not take aspirin or aspirin-like medicines with this medicine. Too much aspirin can be dangerous. Always read the labels carefully. This medicine can irritate your stomach or cause bleeding problems. Do not smoke cigarettes or drink alcohol while taking this medicine. Do not lie down for 30 minutes after taking this medicine to prevent irritation to your throat. If you are scheduled for any medical or dental procedure, tell your healthcare provider that you are taking this medicine. You may need to stop taking this medicine before the procedure. This medicine may be used to treat migraines. If you take migraine medicines for 10 or more days a month, your migraines may get worse. Keep a diary of headache days and medicine use. Contact your healthcare professional if your migraine attacks occur more frequently. What side effects may I notice from receiving this medicine? Side effects that you should report to your doctor or health care professional as soon as possible:  allergic reactions like skin rash, itching or hives, swelling of the face, lips, or tongue  breathing problems  changes in hearing, ringing in the ears  confusion  general ill feeling or flu-like symptoms  pain on swallowing  redness, blistering, peeling or loosening of the skin, including inside the mouth or nose  signs and symptoms of bleeding such as bloody or black, tarry stools; red or dark-brown urine; spitting up blood or brown material that looks like coffee grounds; red spots on the skin; unusual bruising or bleeding from the eye, gums, or nose  trouble passing urine or change in the amount of urine  unusually weak or tired  yellowing of the eyes or skin Side effects that usually do not require medical attention (report to your doctor or health care professional if they continue or are bothersome):  diarrhea or constipation  headache  nausea, vomiting  stomach gas, heartburn This list  may not describe all possible side effects. Call your doctor for medical advice about side effects. You may report side effects to FDA at 1-800-FDA-1088. Where should I keep my medicine? Keep out of the reach of children. Store at room temperature between 15 and 30 degrees C (59 and 86 degrees F). Protect from heat and moisture. Do not use this medicine if it has a strong vinegar smell. Throw away any unused medicine after the expiration date. NOTE: This sheet is a summary. It may not cover all possible information. If you have questions about this medicine, talk to your doctor, pharmacist, or health care provider.  2020 Elsevier/Gold Standard (2016-10-28 10:42:13)   Femoral Site Care This sheet gives you information about how to care for yourself after your procedure. Your health care provider may also give you more specific instructions. If you have problems or questions, contact your health care provider. What can I expect after the procedure? After the procedure, it is common to have:  Bruising that usually fades within 1-2 weeks.  Tenderness at the site. Follow these instructions at home: Wound care  Follow instructions from your health care provider about how to take care of your insertion site. Make sure you: ? Wash your hands with soap and water before you change  your bandage (dressing). If soap and water are not available, use hand sanitizer. ? Change your dressing as told by your health care provider. ? Leave stitches (sutures), skin glue, or adhesive strips in place. These skin closures may need to stay in place for 2 weeks or longer. If adhesive strip edges start to loosen and curl up, you may trim the loose edges. Do not remove adhesive strips completely unless your health care provider tells you to do that.  Do not take baths, swim, or use a hot tub until your health care provider approves.  You may shower 24-48 hours after the procedure or as told by your health care  provider. ? Gently wash the site with plain soap and water. ? Pat the area dry with a clean towel. ? Do not rub the site. This may cause bleeding.  Do not apply powder or lotion to the site. Keep the site clean and dry.  Check your femoral site every day for signs of infection. Check for: ? Redness, swelling, or pain. ? Fluid or blood. ? Warmth. ? Pus or a bad smell. Activity  For the first 2-3 days after your procedure, or as long as directed: ? Avoid climbing stairs as much as possible. ? Do not squat.  Do not lift anything that is heavier than 10 lb (4.5 kg), or the limit that you are told, until your health care provider says that it is safe.  Rest as directed. ? Avoid sitting for a long time without moving. Get up to take short walks every 1-2 hours.  Do not drive for 24 hours if you were given a medicine to help you relax (sedative). General instructions  Take over-the-counter and prescription medicines only as told by your health care provider.  Keep all follow-up visits as told by your health care provider. This is important. Contact a health care provider if you have:  A fever or chills.  You have redness, swelling, or pain around your insertion site. Get help right away if:  The catheter insertion area swells very fast.  You pass out.  You suddenly start to sweat or your skin gets clammy.  The catheter insertion area is bleeding, and the bleeding does not stop when you hold steady pressure on the area.  The area near or just beyond the catheter insertion site becomes pale, cool, tingly, or numb. These symptoms may represent a serious problem that is an emergency. Do not wait to see if the symptoms will go away. Get medical help right away. Call your local emergency services (911 in the [place] Do not drive yourself to the hospital. Summary  After the procedure, it is common to have bruising that usually fades within 1-2 weeks.  Check your femoral site every  day for signs of infection.  Do not lift anything that is heavier than 10 lb (4.5 kg), or the limit that you are told, until your health care provider says that it is safe. This information is not intended to replace advice given to you by your health care provider. Make sure you discuss any questions you have with your health care provider. Document Revised: 09/28/2017 Document Reviewed: 09/28/2017 Elsevier Patient Education  2020 [employer]   Angiogram, Care After This sheet gives you information about how to care for yourself after your procedure. Your doctor may also give you more specific instructions. If you have problems or questions, contact your doctor. Follow these instructions at home: Insertion site care  Follow  instructions from your doctor about how to take care of your long, thin tube (catheter) insertion area. Make sure you: ? Wash your hands with soap and water before you change your bandage (dressing). If you cannot use soap and water, use hand sanitizer. ? Change your bandage as told by your doctor. ? Leave stitches (sutures), skin glue, or skin tape (adhesive) strips in place. They may need to stay in place for 2 weeks or longer. If tape strips get loose and curl up, you may trim the loose edges. Do not remove tape strips completely unless your doctor says it is okay.  Do not take baths, swim, or use a hot tub until your doctor says it is okay.  You may shower 24-48 hours after the procedure or as told by your doctor. ? Gently wash the area with plain soap and water. ? Pat the area dry with a clean towel. ? Do not rub the area. This may cause bleeding.  Do not apply powder or lotion to the area. Keep the area clean and dry.  Check your insertion area every day for signs of infection. Check for: ? More redness, swelling, or pain. ? Fluid or blood. ? Warmth. ? Pus or a bad smell. Activity  Rest as told by your doctor, usually for 1-2 days.  Do not lift  anything that is heavier than 10 lbs. (4.5 kg) or as told by your doctor.  Do not drive for 24 hours if you were given a medicine to help you relax (sedative).  Do not drive or use heavy machinery while taking prescription pain medicine. General instructions   Go back to your normal activities as told by your doctor, usually in about a week. Ask your doctor what activities are safe for you.  If the insertion area starts to bleed, lie flat and put pressure on the area. If the bleeding does not stop, get help right away. This is an emergency.  Drink enough fluid to keep your pee (urine) clear or pale yellow.  Take over-the-counter and prescription medicines only as told by your doctor.  Keep all follow-up visits as told by your doctor. This is important. Contact a doctor if:  You have a fever.  You have chills.  You have more redness, swelling, or pain around your insertion area.  You have fluid or blood coming from your insertion area.  The insertion area feels warm to the touch.  You have pus or a bad smell coming from your insertion area.  You have more bruising around the insertion area.  Blood collects in the tissue around the insertion area (hematoma) that may be painful to the touch. Get help right away if:  You have a lot of pain in the insertion area.  The insertion area swells very fast.  The insertion area is bleeding, and the bleeding does not stop after holding steady pressure on the area.  The area near or just beyond the insertion area becomes pale, cool, tingly, or numb. These symptoms may be an emergency. Do not wait to see if the symptoms will go away. Get medical help right away. Call your local emergency services (911 in the [place] Do not drive yourself to the hospital. Summary  After the procedure, it is common to have bruising and tenderness at the long, thin tube insertion area.  After the procedure, it is important to rest and drink plenty of  fluids.  Do not take baths, swim, or use a hot  tub until your doctor says it is okay to do so. You may shower 24-48 hours after the procedure or as told by your doctor.  If the insertion area starts to bleed, lie flat and put pressure on the area. If the bleeding does not stop, get help right away. This is an emergency. This information is not intended to replace advice given to you by your health care provider. Make sure you discuss any questions you have with your health care provider. Document Revised: 08/28/2017 Document Reviewed: 09/09/2016 Elsevier Patient Education  2020 [employer].   Moderate Conscious Sedation, Adult, Care After These instructions provide you with information about caring for yourself after your procedure. Your health care provider may also give you more specific instructions. Your treatment has been planned according to current medical practices, but problems sometimes occur. Call your health care provider if you have any problems or questions after your procedure. What can I expect after the procedure? After your procedure, it is common:  To feel sleepy for several hours.  To feel clumsy and have poor balance for several hours.  To have poor judgment for several hours.  To vomit if you eat too soon. Follow these instructions at home: For at least 24 hours after the procedure:   Do not: ? Participate in activities where you could fall or become injured. ? Drive. ? Use heavy machinery. ? Drink alcohol. ? Take sleeping pills or medicines that cause drowsiness. ? Make important decisions or sign legal documents. ? Take care of children on your own.  Rest. Eating and drinking  Follow the diet recommended by your health care provider.  If you vomit: ? Drink water, juice, or soup when you can drink without vomiting. ? Make sure you have little or no nausea before eating solid foods. General instructions  Have a responsible adult stay with you until  you are awake and alert.  Take over-the-counter and prescription medicines only as told by your health care provider.  If you smoke, do not smoke without supervision.  Keep all follow-up visits as told by your health care provider. This is important. Contact a health care provider if:  You keep feeling nauseous or you keep vomiting.  You feel light-headed.  You develop a rash.  You have a fever. Get help right away if:  You have trouble breathing. This information is not intended to replace advice given to you by your health care provider. Make sure you discuss any questions you have with your health care provider. Document Revised: 08/28/2017 Document Reviewed: 01/05/2016 Elsevier Patient Education  2020 [employer].

## 2020-04-04 ENCOUNTER — Encounter: Payer: Self-pay | Admitting: Vascular Surgery

## 2020-04-06 ENCOUNTER — Other Ambulatory Visit
Admission: RE | Admit: 2020-04-06 | Payer: Medicare Other | Source: Ambulatory Visit | Attending: Vascular Surgery | Admitting: Vascular Surgery

## 2020-04-09 ENCOUNTER — Other Ambulatory Visit (INDEPENDENT_AMBULATORY_CARE_PROVIDER_SITE_OTHER): Payer: Self-pay | Admitting: Nurse Practitioner

## 2020-04-10 ENCOUNTER — Ambulatory Visit: Admission: RE | Admit: 2020-04-10 | Payer: Medicare Other | Source: Home / Self Care | Admitting: Vascular Surgery

## 2020-04-10 ENCOUNTER — Encounter: Admission: RE | Payer: Self-pay | Source: Home / Self Care

## 2020-04-10 SURGERY — LOWER EXTREMITY ANGIOGRAPHY
Anesthesia: Moderate Sedation | Laterality: Left

## 2020-04-12 ENCOUNTER — Other Ambulatory Visit: Payer: Medicare Other

## 2020-04-12 ENCOUNTER — Ambulatory Visit: Admission: RE | Admit: 2020-04-12 | Payer: Medicare Other | Source: Ambulatory Visit

## 2020-04-19 ENCOUNTER — Other Ambulatory Visit: Payer: Self-pay

## 2020-04-19 ENCOUNTER — Ambulatory Visit
Admission: RE | Admit: 2020-04-19 | Discharge: 2020-04-19 | Disposition: A | Discharge status: Home / Self Care | Payer: Medicare Other | Source: Ambulatory Visit | Attending: Nurse Practitioner | Admitting: Nurse Practitioner

## 2020-04-19 DIAGNOSIS — I6523 Occlusion and stenosis of bilateral carotid arteries: Secondary | ICD-10-CM | POA: Diagnosis not present

## 2020-04-19 HISTORY — DX: Heart failure, unspecified: I50.9

## 2020-04-19 MED ORDER — IOHEXOL 350 MG/ML SOLN
75.0000 mL | Freq: Once | INTRAVENOUS | Status: AC | PRN
  Administered 2020-04-19: 75 mL via INTRAVENOUS

## 2020-04-26 ENCOUNTER — Other Ambulatory Visit (INDEPENDENT_AMBULATORY_CARE_PROVIDER_SITE_OTHER): Payer: Self-pay | Admitting: Vascular Surgery

## 2020-04-26 DIAGNOSIS — Z9862 Peripheral vascular angioplasty status: Secondary | ICD-10-CM

## 2020-04-26 DIAGNOSIS — I739 Peripheral vascular disease, unspecified: Secondary | ICD-10-CM

## 2020-04-27 ENCOUNTER — Encounter (INDEPENDENT_AMBULATORY_CARE_PROVIDER_SITE_OTHER): Payer: Self-pay | Admitting: Nurse Practitioner

## 2020-04-27 ENCOUNTER — Other Ambulatory Visit: Payer: Self-pay

## 2020-04-27 ENCOUNTER — Ambulatory Visit (INDEPENDENT_AMBULATORY_CARE_PROVIDER_SITE_OTHER): Payer: Medicare Other

## 2020-04-27 ENCOUNTER — Ambulatory Visit (INDEPENDENT_AMBULATORY_CARE_PROVIDER_SITE_OTHER): Payer: Medicare Other | Admitting: Nurse Practitioner

## 2020-04-27 VITALS — BP 102/64 | HR 65 | Resp 16 | Wt 171.4 lb

## 2020-04-27 DIAGNOSIS — Z9862 Peripheral vascular angioplasty status: Secondary | ICD-10-CM

## 2020-04-27 DIAGNOSIS — I700 Atherosclerosis of aorta: Secondary | ICD-10-CM

## 2020-04-27 DIAGNOSIS — Z720 Tobacco use: Secondary | ICD-10-CM | POA: Diagnosis not present

## 2020-04-27 DIAGNOSIS — I739 Peripheral vascular disease, unspecified: Secondary | ICD-10-CM | POA: Diagnosis not present

## 2020-04-27 DIAGNOSIS — I6523 Occlusion and stenosis of bilateral carotid arteries: Secondary | ICD-10-CM | POA: Diagnosis not present

## 2020-04-27 DIAGNOSIS — I25118 Atherosclerotic heart disease of native coronary artery with other forms of angina pectoris: Secondary | ICD-10-CM | POA: Diagnosis not present

## 2020-04-30 NOTE — H&P (View-Only) (Signed)
Subjective:    Patient ID: Dennis Preston, male    DOB: [birth year], 69 y.o.   MRN: [id number] Chief Complaint  Patient presents with  . Follow-up    [facility] 3wk post le angio    The patient returns to the office for followup and review status post angiogram with intervention. The patient no improvement in the lower extremity symptoms.  The patient continues to have severe claudication with rest pain symptoms.  The patient's most recent angiogram on 04/03/2020 showed extensive disease bilaterally.  Angioplasty of the right profunda was not helpful in relieving the patient's pain or giving him any sort of relief.  Based on his extensive disease it was felt that continued pain should be treated with bilateral femoral endarterectomies.  The patient also has evidence of carotid artery disease based on independent review of the patient's CT scan by myself as well as Dr. Gilda Crease, the patient has approximately 75% or greater stenosis of the left internal carotid artery with 60 to 65% stenosis of the right internal carotid artery.  He denies any TIA or amaurosis fugax-like symptoms.  He denies any issues with his vision.  He denies any issues with any strokelike symptoms.  There have been no significant changes to the patient's overall health care.   The patient denies history of DVT, PE or superficial thrombophlebitis. The patient denies recent episodes of angina or shortness of breath.   Following intervention on 04/03/2020 the patient has a right ABI 0.71 and a left of 0.68.  The patient has monophasic tibial artery waveforms with slightly dampened toe waveforms bilaterally.   Review of Systems  Eyes: Negative for visual disturbance.  Cardiovascular: Positive for leg swelling.       Claudication  All other systems reviewed and are negative.      Objective:   Physical Exam Vitals reviewed.  HENT:     Head: Normocephalic.  Neck:     Vascular: Carotid bruit present.  Cardiovascular:      Rate and Rhythm: Normal rate and regular rhythm.     Pulses: Decreased pulses.     Heart sounds: Normal heart sounds.  Pulmonary:     Effort: Pulmonary effort is normal.     Breath sounds: Normal breath sounds.  Musculoskeletal:     Right lower leg: Edema present.  Skin:    Capillary Refill: Capillary refill takes 2 to 3 seconds.  Neurological:     Mental Status: He is alert and oriented to person, place, and time.  Psychiatric:        Mood and Affect: Mood normal.        Behavior: Behavior normal.        Thought Content: Thought content normal.        Judgment: Judgment normal.     BP (!) 102/64 (BP Location: Left Arm)   Pulse 65   Resp 16   Wt 171 lb 6.4 oz (77.7 kg)   BMI 26.06 kg/m   Past Medical History:  Diagnosis Date  . Alcohol abuse   . Amaurosis fugax   . Atrial fibrillation (HCC)   . Carotid stenosis   . CHF (congestive heart failure) (HCC)   . Coagulopathy (HCC)   . COPD (chronic obstructive pulmonary disease) (HCC)   . Coronary artery disease   . Depression   . Hyperlipemia   . Hypotension   . Leucocytosis   . Liver dysfunction   . Myocardial infarction (HCC)    non-STEMI. approx 2016  .  Peripheral artery disease (HCC)    legs  . Tobacco abuse   . Wears dentures    full upper and lower    Social History   Socioeconomic History  . Marital status: Married    Spouse name: Not on file  . Number of children: Not on file  . Years of education: Not on file  . Highest education level: Not on file  Occupational History  . Not on file  Tobacco Use  . Smoking status: Former Smoker    Packs/day: 1.00    Years: 54.00    Pack years: 54.00    Types: Cigarettes  . Smokeless tobacco: Never Used  . Tobacco comment: was 2.5 PPD for 50 yrs until 2016, quit a "few days ago"  Vaping Use  . Vaping Use: Never used  Substance and Sexual Activity  . Alcohol use: Not Currently    Comment: quit over 15 yrs ago  . Drug use: Never  . Sexual activity: Not on  file  Other Topics Concern  . Not on file  Social History Narrative  . Not on file   Social Determinants of Health   Financial Resource Strain:   . Difficulty of Paying Living Expenses:   Food Insecurity:   . Worried About Programme researcher, broadcasting/film/video in the Last Year:   . Barista in the Last Year:   Transportation Needs:   . Freight forwarder (Medical):   Marland Kitchen Lack of Transportation (Non-Medical):   Physical Activity:   . Days of Exercise per Week:   . Minutes of Exercise per Session:   Stress:   . Feeling of Stress :   Social Connections:   . Frequency of Communication with Friends and Family:   . Frequency of Social Gatherings with Friends and Family:   . Attends Religious Services:   . Active Member of Clubs or Organizations:   . Attends Banker Meetings:   Marland Kitchen Marital Status:   Intimate Partner Violence:   . Fear of Current or Ex-Partner:   . Emotionally Abused:   Marland Kitchen Physically Abused:   . Sexually Abused:     Past Surgical History:  Procedure Laterality Date  . CARDIAC CATHETERIZATION N/A 07/10/2015   Procedure: Right/Left Heart Cath and Coronary Angiography;  Surgeon: Alwyn Pea, MD;  Location: [facility];  Service: Cardiovascular;  Laterality: N/A;  . CORONARY ARTERY BYPASS GRAFT  07/13/2015   [facility].  4 vessel  . LEFT HEART CATH AND CORS/GRAFTS ANGIOGRAPHY N/A 01/23/2020   Procedure: LEFT HEART CATH AND CORS/GRAFTS ANGIOGRAPHY;  Surgeon: Alwyn Pea, MD;  Location: [facility];  Service: Cardiovascular;  Laterality: N/A;  . LOWER EXTREMITY ANGIOGRAPHY Right 04/03/2020   Procedure: LOWER EXTREMITY ANGIOGRAPHY;  Surgeon: Renford Dills, MD;  Location: [facility];  Service: Cardiovascular;  Laterality: Right;  . OLECRANON BURSECTOMY Left 05/06/2019   Procedure: OLECRANON BURSECTOMY AND DEBRIDEMENT;  Surgeon: Signa Kell, MD;  Location: [facility] OR[facility];  Service: Orthopedics;  Laterality: Left;    Family History    Problem Relation Age of Onset  . Depression Sister     No Known Allergies     Assessment & Plan:   1. Aortic atherosclerosis (HCC)  Recommend:  The patient has evidence of severe atherosclerotic changes of both lower extremities associated with ulceration and tissue loss of the foot.  This represents a limb threatening ischemia and places the patient at the risk for limb loss.  Angiography has been performed and the situation is not ideal for intervention.  Given this finding open surgical repair is recommended.   Patient should undergo arterial reconstruction of the lower extremity with the hope for limb salvage.  The risks and benefits as well as the alternative therapies was discussed in detail with the patient.  All questions were answered.  Patient agrees to proceed with bypass surgery.  The patient will follow up with me in the office after the procedure.     2. Tobacco use Patient has recently stopped smoking.  Smoking cessation is encouraged.  3. Carotid atherosclerosis, bilateral The patient does have evidence of significant carotid stenosis in the left internal carotid artery.  However the patient's lower extremity pain is much more pressing at this time.  We will plan on intervening with the bilateral femoral endarterectomy first with a left carotid artery endarterectomy to follow once the patient has had some time to recover from his femoral endarterectomy.   The discussion was held with the patient and wife in regards to the carotid disease as well as femoral disea.  Se.  Based on the patient's unrelenting rest pain, the decision was made by the patient and wife to proceed with femoral endarterectomy first 4. Coronary artery disease of native artery of native heart with stable angina pectoris [facility]) Cardiac clearance will be obtained prior to surgery.  Patient currently sees Dr. Juliann Pares   Current Outpatient Medications on File Prior to Visit  Medication Sig Dispense  Refill  . acetaminophen (TYLENOL) 325 MG tablet Take 650 mg by mouth every 6 (six) hours as needed for mild pain or headache.     . albuterol (PROVENTIL) (2.5 MG/3ML) 0.083% nebulizer solution Take 3 mLs (2.5 mg total) by nebulization every 6 (six) hours as needed for wheezing or shortness of breath. 360 mL 0  . albuterol (VENTOLIN HFA) 108 (90 Base) MCG/ACT inhaler Inhale 1-2 puffs into the lungs every 6 (six) hours as needed for wheezing or shortness of breath. 8 g 0  . aspirin EC 81 MG tablet Take 1 tablet (81 mg total) by mouth daily. Swallow whole. 150 tablet 1  . atorvastatin (LIPITOR) 80 MG tablet Take 1 tablet (80 mg total) by mouth daily at 6 PM.    . clopidogrel (PLAVIX) 75 MG tablet Take 75 mg by mouth every evening.     . Fluticasone-Umeclidin-Vilant (TRELEGY ELLIPTA) 100-62.5-25 MCG/INH AEPB Inhale 1 puff into the lungs daily as needed (Wheezing and shortness of breath).     . furosemide (LASIX) 40 MG tablet Take 40 mg by mouth 2 (two) times daily.     Marland Kitchen HYDROcodone-homatropine (HYCODAN) 5-1.5 MG/5ML syrup Take 5 mLs by mouth every 8 (eight) hours as needed for cough. 120 mL 0  . sacubitril-valsartan (ENTRESTO) 24-26 MG Take 1 tablet by mouth every 12 (twelve) hours.     . carvedilol (COREG) 3.125 MG tablet Take 1 tablet (3.125 mg total) by mouth daily. (Patient not taking: Reported on 04/03/2020)    . spironolactone (ALDACTONE) 25 MG tablet Take 12.5 mg by mouth 2 (two) times daily.  (Patient not taking: Reported on 04/03/2020)     No current facility-administered medications on file prior to visit.    There are no Patient Instructions on file for this visit. No follow-ups on file.   Georgiana Spinner, NP

## 2020-04-30 NOTE — Progress Notes (Signed)
Subjective:    Patient ID: Dennis Preston, male    DOB: [birth year], 69 y.o.   MRN: [id number] Chief Complaint  Patient presents with  . Follow-up    [facility] 3wk post le angio    The patient returns to the office for followup and review status post angiogram with intervention. The patient no improvement in the lower extremity symptoms.  The patient continues to have severe claudication with rest pain symptoms.  The patient's most recent angiogram on 04/03/2020 showed extensive disease bilaterally.  Angioplasty of the right profunda was not helpful in relieving the patient's pain or giving him any sort of relief.  Based on his extensive disease it was felt that continued pain should be treated with bilateral femoral endarterectomies.  The patient also has evidence of carotid artery disease based on independent review of the patient's CT scan by myself as well as Dr. Gilda Crease, the patient has approximately 75% or greater stenosis of the left internal carotid artery with 60 to 65% stenosis of the right internal carotid artery.  He denies any TIA or amaurosis fugax-like symptoms.  He denies any issues with his vision.  He denies any issues with any strokelike symptoms.  There have been no significant changes to the patient's overall health care.   The patient denies history of DVT, PE or superficial thrombophlebitis. The patient denies recent episodes of angina or shortness of breath.   Following intervention on 04/03/2020 the patient has a right ABI 0.71 and a left of 0.68.  The patient has monophasic tibial artery waveforms with slightly dampened toe waveforms bilaterally.   Review of Systems  Eyes: Negative for visual disturbance.  Cardiovascular: Positive for leg swelling.       Claudication  All other systems reviewed and are negative.      Objective:   Physical Exam Vitals reviewed.  HENT:     Head: Normocephalic.  Neck:     Vascular: Carotid bruit present.  Cardiovascular:      Rate and Rhythm: Normal rate and regular rhythm.     Pulses: Decreased pulses.     Heart sounds: Normal heart sounds.  Pulmonary:     Effort: Pulmonary effort is normal.     Breath sounds: Normal breath sounds.  Musculoskeletal:     Right lower leg: Edema present.  Skin:    Capillary Refill: Capillary refill takes 2 to 3 seconds.  Neurological:     Mental Status: He is alert and oriented to person, place, and time.  Psychiatric:        Mood and Affect: Mood normal.        Behavior: Behavior normal.        Thought Content: Thought content normal.        Judgment: Judgment normal.     BP (!) 102/64 (BP Location: Left Arm)   Pulse 65   Resp 16   Wt 171 lb 6.4 oz (77.7 kg)   BMI 26.06 kg/m   Past Medical History:  Diagnosis Date  . Alcohol abuse   . Amaurosis fugax   . Atrial fibrillation (HCC)   . Carotid stenosis   . CHF (congestive heart failure) (HCC)   . Coagulopathy (HCC)   . COPD (chronic obstructive pulmonary disease) (HCC)   . Coronary artery disease   . Depression   . Hyperlipemia   . Hypotension   . Leucocytosis   . Liver dysfunction   . Myocardial infarction (HCC)    non-STEMI. approx 2016  .  Peripheral artery disease (HCC)    legs  . Tobacco abuse   . Wears dentures    full upper and lower    Social History   Socioeconomic History  . Marital status: Married    Spouse name: Not on file  . Number of children: Not on file  . Years of education: Not on file  . Highest education level: Not on file  Occupational History  . Not on file  Tobacco Use  . Smoking status: Former Smoker    Packs/day: 1.00    Years: 54.00    Pack years: 54.00    Types: Cigarettes  . Smokeless tobacco: Never Used  . Tobacco comment: was 2.5 PPD for 50 yrs until 2016, quit a "few days ago"  Vaping Use  . Vaping Use: Never used  Substance and Sexual Activity  . Alcohol use: Not Currently    Comment: quit over 15 yrs ago  . Drug use: Never  . Sexual activity: Not on  file  Other Topics Concern  . Not on file  Social History Narrative  . Not on file   Social Determinants of Health   Financial Resource Strain:   . Difficulty of Paying Living Expenses:   Food Insecurity:   . Worried About Running Out of Food in the Last Year:   . Ran Out of Food in the Last Year:   Transportation Needs:   . Lack of Transportation (Medical):   . Lack of Transportation (Non-Medical):   Physical Activity:   . Days of Exercise per Week:   . Minutes of Exercise per Session:   Stress:   . Feeling of Stress :   Social Connections:   . Frequency of Communication with Friends and Family:   . Frequency of Social Gatherings with Friends and Family:   . Attends Religious Services:   . Active Member of Clubs or Organizations:   . Attends Club or Organization Meetings:   . Marital Status:   Intimate Partner Violence:   . Fear of Current or Ex-Partner:   . Emotionally Abused:   . Physically Abused:   . Sexually Abused:     Past Surgical History:  Procedure Laterality Date  . CARDIAC CATHETERIZATION N/A 07/10/2015   Procedure: Right/Left Heart Cath and Coronary Angiography;  Surgeon: Dwayne D Callwood, MD;  Location: [facility];  Service: Cardiovascular;  Laterality: N/A;  . CORONARY ARTERY BYPASS GRAFT  07/13/2015   [facility].  4 vessel  . LEFT HEART CATH AND CORS/GRAFTS ANGIOGRAPHY N/A 01/23/2020   Procedure: LEFT HEART CATH AND CORS/GRAFTS ANGIOGRAPHY;  Surgeon: Callwood, Dwayne D, MD;  Location: [facility];  Service: Cardiovascular;  Laterality: N/A;  . LOWER EXTREMITY ANGIOGRAPHY Right 04/03/2020   Procedure: LOWER EXTREMITY ANGIOGRAPHY;  Surgeon: Schnier, Gregory G, MD;  Location: [facility];  Service: Cardiovascular;  Laterality: Right;  . OLECRANON BURSECTOMY Left 05/06/2019   Procedure: OLECRANON BURSECTOMY AND DEBRIDEMENT;  Surgeon: Patel, Sunny, MD;  Location: [facility];  Service: Orthopedics;  Laterality: Left;    Family History    Problem Relation Age of Onset  . Depression Sister     No Known Allergies     Assessment & Plan:   1. Aortic atherosclerosis (HCC)  Recommend:  The patient has evidence of severe atherosclerotic changes of both lower extremities associated with ulceration and tissue loss of the foot.  This represents a limb threatening ischemia and places the patient at the risk for limb loss.    Angiography has been performed and the situation is not ideal for intervention.  Given this finding open surgical repair is recommended.   Patient should undergo arterial reconstruction of the lower extremity with the hope for limb salvage.  The risks and benefits as well as the alternative therapies was discussed in detail with the patient.  All questions were answered.  Patient agrees to proceed with bypass surgery.  The patient will follow up with me in the office after the procedure.     2. Tobacco use Patient has recently stopped smoking.  Smoking cessation is encouraged.  3. Carotid atherosclerosis, bilateral The patient does have evidence of significant carotid stenosis in the left internal carotid artery.  However the patient's lower extremity pain is much more pressing at this time.  We will plan on intervening with the bilateral femoral endarterectomy first with a left carotid artery endarterectomy to follow once the patient has had some time to recover from his femoral endarterectomy.   The discussion was held with the patient and wife in regards to the carotid disease as well as femoral disea.  Se.  Based on the patient's unrelenting rest pain, the decision was made by the patient and wife to proceed with femoral endarterectomy first 4. Coronary artery disease of native artery of native heart with stable angina pectoris [facility]) Cardiac clearance will be obtained prior to surgery.  Patient currently sees Dr. Juliann Pares   Current Outpatient Medications on File Prior to Visit  Medication Sig Dispense  Refill  . acetaminophen (TYLENOL) 325 MG tablet Take 650 mg by mouth every 6 (six) hours as needed for mild pain or headache.     . albuterol (PROVENTIL) (2.5 MG/3ML) 0.083% nebulizer solution Take 3 mLs (2.5 mg total) by nebulization every 6 (six) hours as needed for wheezing or shortness of breath. 360 mL 0  . albuterol (VENTOLIN HFA) 108 (90 Base) MCG/ACT inhaler Inhale 1-2 puffs into the lungs every 6 (six) hours as needed for wheezing or shortness of breath. 8 g 0  . aspirin EC 81 MG tablet Take 1 tablet (81 mg total) by mouth daily. Swallow whole. 150 tablet 1  . atorvastatin (LIPITOR) 80 MG tablet Take 1 tablet (80 mg total) by mouth daily at 6 PM.    . clopidogrel (PLAVIX) 75 MG tablet Take 75 mg by mouth every evening.     . Fluticasone-Umeclidin-Vilant (TRELEGY ELLIPTA) 100-62.5-25 MCG/INH AEPB Inhale 1 puff into the lungs daily as needed (Wheezing and shortness of breath).     . furosemide (LASIX) 40 MG tablet Take 40 mg by mouth 2 (two) times daily.     Marland Kitchen HYDROcodone-homatropine (HYCODAN) 5-1.5 MG/5ML syrup Take 5 mLs by mouth every 8 (eight) hours as needed for cough. 120 mL 0  . sacubitril-valsartan (ENTRESTO) 24-26 MG Take 1 tablet by mouth every 12 (twelve) hours.     . carvedilol (COREG) 3.125 MG tablet Take 1 tablet (3.125 mg total) by mouth daily. (Patient not taking: Reported on 04/03/2020)    . spironolactone (ALDACTONE) 25 MG tablet Take 12.5 mg by mouth 2 (two) times daily.  (Patient not taking: Reported on 04/03/2020)     No current facility-administered medications on file prior to visit.    There are no Patient Instructions on file for this visit. No follow-ups on file.   Georgiana Spinner, NP

## 2020-05-01 ENCOUNTER — Encounter (INDEPENDENT_AMBULATORY_CARE_PROVIDER_SITE_OTHER): Payer: Self-pay | Admitting: Nurse Practitioner

## 2020-05-03 ENCOUNTER — Telehealth (INDEPENDENT_AMBULATORY_CARE_PROVIDER_SITE_OTHER): Payer: Self-pay

## 2020-05-03 NOTE — Telephone Encounter (Signed)
Spoke with the patient and he is scheduled with Dr. Gilda Crease for bilateral femoral endarterectomies on 05/23/20 at [facility]. Pre-op is scheduled on 05/16/20 at 9:00 am at the [facility] and covid testing on 0823/21 between 8-1 pm at the [facility].  Pre-surgical instructions were discussed and will be mailed.

## 2020-05-15 ENCOUNTER — Other Ambulatory Visit (INDEPENDENT_AMBULATORY_CARE_PROVIDER_SITE_OTHER): Payer: Self-pay | Admitting: Nurse Practitioner

## 2020-05-16 ENCOUNTER — Other Ambulatory Visit: Payer: Medicare Other

## 2020-05-16 ENCOUNTER — Other Ambulatory Visit: Payer: Self-pay

## 2020-05-16 ENCOUNTER — Encounter
Admission: RE | Admit: 2020-05-16 | Discharge: 2020-05-16 | Disposition: A | Discharge status: Home / Self Care | Payer: Medicare Other | Source: Ambulatory Visit | Attending: Vascular Surgery | Admitting: Vascular Surgery

## 2020-05-16 DIAGNOSIS — F1010 Alcohol abuse, uncomplicated: Secondary | ICD-10-CM | POA: Insufficient documentation

## 2020-05-16 DIAGNOSIS — F418 Other specified anxiety disorders: Secondary | ICD-10-CM | POA: Diagnosis not present

## 2020-05-16 DIAGNOSIS — I509 Heart failure, unspecified: Secondary | ICD-10-CM | POA: Insufficient documentation

## 2020-05-16 DIAGNOSIS — I2510 Atherosclerotic heart disease of native coronary artery without angina pectoris: Secondary | ICD-10-CM | POA: Insufficient documentation

## 2020-05-16 DIAGNOSIS — Z8673 Personal history of transient ischemic attack (TIA), and cerebral infarction without residual deficits: Secondary | ICD-10-CM | POA: Insufficient documentation

## 2020-05-16 DIAGNOSIS — Z01812 Encounter for preprocedural laboratory examination: Secondary | ICD-10-CM | POA: Diagnosis present

## 2020-05-16 DIAGNOSIS — Z951 Presence of aortocoronary bypass graft: Secondary | ICD-10-CM | POA: Insufficient documentation

## 2020-05-16 DIAGNOSIS — I4891 Unspecified atrial fibrillation: Secondary | ICD-10-CM | POA: Diagnosis not present

## 2020-05-16 DIAGNOSIS — Z79899 Other long term (current) drug therapy: Secondary | ICD-10-CM | POA: Diagnosis not present

## 2020-05-16 DIAGNOSIS — Z7951 Long term (current) use of inhaled steroids: Secondary | ICD-10-CM | POA: Diagnosis not present

## 2020-05-16 DIAGNOSIS — I252 Old myocardial infarction: Secondary | ICD-10-CM | POA: Insufficient documentation

## 2020-05-16 DIAGNOSIS — E785 Hyperlipidemia, unspecified: Secondary | ICD-10-CM | POA: Diagnosis not present

## 2020-05-16 DIAGNOSIS — Z8669 Personal history of other diseases of the nervous system and sense organs: Secondary | ICD-10-CM | POA: Diagnosis not present

## 2020-05-16 DIAGNOSIS — I700 Atherosclerosis of aorta: Secondary | ICD-10-CM | POA: Diagnosis not present

## 2020-05-16 DIAGNOSIS — Z87891 Personal history of nicotine dependence: Secondary | ICD-10-CM | POA: Diagnosis not present

## 2020-05-16 DIAGNOSIS — Z7982 Long term (current) use of aspirin: Secondary | ICD-10-CM | POA: Insufficient documentation

## 2020-05-16 DIAGNOSIS — I739 Peripheral vascular disease, unspecified: Secondary | ICD-10-CM | POA: Insufficient documentation

## 2020-05-16 DIAGNOSIS — J449 Chronic obstructive pulmonary disease, unspecified: Secondary | ICD-10-CM | POA: Diagnosis not present

## 2020-05-16 LAB — BASIC METABOLIC PANEL
Anion gap: 9 (ref 5–15)
BUN: 18 mg/dL (ref 8–23)
CO2: 32 mmol/L (ref 22–32)
Calcium: 9.3 mg/dL (ref 8.9–10.3)
Chloride: 99 mmol/L (ref 98–111)
Creatinine, Ser: 1.01 mg/dL (ref 0.61–1.24)
GFR calc Af Amer: >60 mL/min (ref >60)
GFR calc non Af Amer: >60 mL/min (ref >60)
Glucose, Bld: 105 mg/dL — ABNORMAL HIGH (ref 70–99)
Potassium: 4.3 mmol/L (ref 3.5–5.1)
Sodium: 140 mmol/L (ref 135–145)

## 2020-05-16 LAB — TYPE AND SCREEN
ABO/RH(D): A NEG
Antibody Screen: NEGATIVE

## 2020-05-16 LAB — CBC WITH DIFFERENTIAL/PLATELET
Abs Immature Granulocytes: 0.03 10*3/uL (ref 0.00–0.07)
Basophils Absolute: 0 10*3/uL (ref 0.0–0.1)
Basophils Relative: 0 %
Eosinophils Absolute: 0.4 10*3/uL (ref 0.0–0.5)
Eosinophils Relative: 3 %
HCT: 46.8 % (ref 39.0–52.0)
Hemoglobin: 15.7 g/dL (ref 13.0–17.0)
Immature Granulocytes: 0 %
Lymphocytes Relative: 10 %
Lymphs Abs: 1.1 10*3/uL (ref 0.7–4.0)
MCH: 30.5 pg (ref 26.0–34.0)
MCHC: 33.5 g/dL (ref 30.0–36.0)
MCV: 91.1 fL (ref 80.0–100.0)
Monocytes Absolute: 1 10*3/uL (ref 0.1–1.0)
Monocytes Relative: 10 %
Neutro Abs: 8.3 10*3/uL — ABNORMAL HIGH (ref 1.7–7.7)
Neutrophils Relative %: 77 %
Platelets: 214 10*3/uL (ref 150–400)
RBC: 5.14 MIL/uL (ref 4.22–5.81)
RDW: 15.2 % (ref 11.5–15.5)
WBC: 10.9 10*3/uL — ABNORMAL HIGH (ref 4.0–10.5)
nRBC: 0 % (ref 0.0–0.2)

## 2020-05-16 LAB — PROTIME-INR
INR: 1 (ref 0.8–1.2)
Prothrombin Time: 12.8 seconds (ref 11.4–15.2)

## 2020-05-16 LAB — APTT: aPTT: 31 seconds (ref 24–36)

## 2020-05-16 NOTE — Progress Notes (Signed)
[facility] Perioperative Services  Pre-Admission/Anesthesia Testing Clinical Review  Date: 05/18/20  Patient Demographics:  Name: Dennis Preston DOB:   1950-12-30 MRN:   [id number]  Planned Surgical Procedure(s):    Case: [id number] Date/Time: 05/23/20 1049   Procedure: ENDARTERECTOMY FEMORAL (Bilateral )   Anesthesia type: General   Pre-op diagnosis: ASO WITH REST PAIN   Location: [facility] OR ROOM 07 / [facility] ORS FOR ANESTHESIA GROUP   Surgeons: Renford Dills, MD     NOTE: Available PAT nursing documentation and vital signs have been reviewed. Clinical nursing staff has updated patient's PMH/PSHx, current medication list, and drug allergies/intolerances to ensure comprehensive history available to assist in medical decision making as it pertains to the aforementioned surgical procedure and anticipated anesthetic course.   Clinical Discussion:  Dennis Preston is a 69 y.o. male who is submitted for pre-surgical anesthesia review and clearance prior to him undergoing the above procedure. Patient is a Former Smoker (54 pack years; quit 02/2020). Pertinent PMH includes: CAD, NSTEMI (s/p CABG x4), A. fib, aortic atherosclerosis CHF, COPD, HLD, PVD, TIA, amaurosis fugax, depression, anxiety, alcohol abuse (abnormal LFTs).   Patient admitted to [facility] from 02/13/2020 through 02/15/2020 for an AECOPD; notes reviewed.  Patient initially presented to the ED with significant cough and shortness of breath.  Pulse oximetry at home reported to be in the 80s.  Patient noted to be 90% on RA at rest upon arrival to the ER; placed on 2 L/[place] with noted improvement.  Patient with noted oxygen desaturation to 83% with exertion.  Patient does not use supplemental oxygen at home.  CXR revealed mildly hyperexpanded lungs; no pneumonia.  Labs grossly unremarkable; SARS-CoV-2 negative.    Patient is followed by cardiology Juliann Pares, MD). He was last seen in the cardiology clinic on 03/05/2020;  notes reviewed.  At that time, patient was seen for significant claudication secondary to known PVD.  Patient with PMH significant for multivessel CAD s/p CABG x4.  Lexiscan performed in 12/2019 interpreted as abnormal; high risk study.  LVEF reduced at 49% with inferior hypokinesis. Subsequent cardiac catheterization revealed severe multivessel CAD; 3-4 bypass grafts patent (see full results below). Patient denied any angina, however he is still having exertional dyspnea, which was felt to be related in part to his moderate to severe COPD.  Patient has not experienced any orthopnea, PND, vertiginous symptoms, peripheral edema, or presyncope/syncope.  Patient was referred to pain vascular for consideration of surgical intervention.  Following his consult with vascular surgery, BILATERAL femoral endarterectomies were planned.  Given patient's significant cardiovascular history, presurgical cardiac clearance was sought by the PAT team.  Clearance for procedure has been issued with a HIGH risk stratification. This patient is on daily DAPT (clopidogrel + ASA). He has been instructed on recommendations for holding his DAPT for 5 days prior to his procedure and plan to restart 2 days post-op. The patient has been instructed that his last dose of his anticoagulant will be on 05/17/2020  Patient is also followed by pulmonology for moderate to severe COPD; sees Dr. Meredeth Ide. He was last seen in the outpatient pulmonology clinic on 03/28/2020; notes reviewed.  At that time, patient was doing much better following his admission.  Patient has stopped smoking.  Patient continued to report exertional dyspnea.  His only other physical complaint was bilateral lower extremity claudication pain.  Inhalers were adjusted.  Portable supplemental oxygen was ordered for use with exertion.  Given patient's significant history of severe COPD, presurgical  pulmonary clearance was sought by PAT team.  Per pulmonology, Patient may proceed  with planned procedure, however he is being stratified as HIGH risk from a pulmonary standpoint as well.   He denies previous intra-operative complications with anesthesia. He underwent a general anesthetic course at [facility] (ASA IV) in 06/2015 with no documented complications.   Vitals with BMI 05/16/2020 04/27/2020 04/03/2020  Height 5\' 8"  - -  Weight 171 lbs 171 lbs 6 oz -  BMI 26.01 - -  Systolic 103 102  Diastolic 56 64 60  Pulse 57 65 50  Some encounter information is confidential and restricted. Go to Review Flowsheets activity to see all data.    Providers/Specialists:   NOTE: Primary physician provider listed below. Patient may have been seen by APP or partner within same practice.   PROVIDER ROLE LAST OV  No att. providers found Vascular Surgery 04/27/2020  04/29/2020, MD Primary Care Provider 08/08/2019  13/05/2019, MD Cardiology 03/05/2020  05/05/2020, MD Pulmonology 03/28/2020   Allergies:  Patient has no known allergies.  Current Home Medications:   . acetaminophen (TYLENOL) 325 MG tablet  . albuterol (PROVENTIL) (2.5 MG/3ML) 0.083% nebulizer solution  . albuterol (VENTOLIN HFA) 108 (90 Base) MCG/ACT inhaler  . atorvastatin (LIPITOR) 80 MG tablet  . clopidogrel (PLAVIX) 75 MG tablet  . Fluticasone-Umeclidin-Vilant (TRELEGY ELLIPTA) 100-62.5-25 MCG/INH AEPB  . furosemide (LASIX) 40 MG tablet  . HYDROcodone-homatropine (HYCODAN) 5-1.5 MG/5ML syrup  . sacubitril-valsartan (ENTRESTO) 24-26 MG  . aspirin EC 81 MG tablet   No current facility-administered medications for this encounter.   History:   Past Medical History:  Diagnosis Date  . Alcohol abuse   . Amaurosis fugax   . Atrial fibrillation (HCC)   . Carotid stenosis   . CHF (congestive heart failure) (HCC)   . Coagulopathy (HCC)   . COPD (chronic obstructive pulmonary disease) (HCC)   . Coronary artery disease   . Depression   . Hyperlipemia   . Hypotension   . Leucocytosis   .  Liver dysfunction   . Myocardial infarction (HCC)    non-STEMI. approx 2016  . Peripheral artery disease (HCC)    legs  . Tobacco abuse   . Wears dentures    full upper and lower   Past Surgical History:  Procedure Laterality Date  . CARDIAC CATHETERIZATION N/A 07/10/2015   Procedure: Right/Left Heart Cath and Coronary Angiography;  Surgeon: 09/09/2015, MD;  Location: [facility] INVASIVE CV LAB;  Service: Cardiovascular;  Laterality: N/A;  . CORONARY ARTERY BYPASS GRAFT  07/13/2015   [facility].  4 vessel  . LEFT HEART CATH AND CORS/GRAFTS ANGIOGRAPHY N/A 01/23/2020   Procedure: LEFT HEART CATH AND CORS/GRAFTS ANGIOGRAPHY;  Surgeon: 01/25/2020, MD;  Location: [facility] INVASIVE CV LAB;  Service: Cardiovascular;  Laterality: N/A;  . LOWER EXTREMITY ANGIOGRAPHY Right 04/03/2020   Procedure: LOWER EXTREMITY ANGIOGRAPHY;  Surgeon: 06/04/2020, MD;  Location: [facility] INVASIVE CV LAB;  Service: Cardiovascular;  Laterality: Right;  . OLECRANON BURSECTOMY Left 05/06/2019   Procedure: OLECRANON BURSECTOMY AND DEBRIDEMENT;  Surgeon: 07/06/2019, MD;  Location: [facility] ORS;  Service: Orthopedics;  Laterality: Left;   Family History  Problem Relation Age of Onset  . Depression Sister    Social History   Tobacco Use  . Smoking status: Former Smoker    Packs/day: 1.00    Years: 54.00    Pack years: 54.00    Types: Cigarettes    Quit date: 03/16/2020  Years since quitting: 0.1  . Smokeless tobacco: Never Used  . Tobacco comment: was 2.5 PPD for 50 yrs until 2016, quit a "few days ago"  Vaping Use  . Vaping Use: Never used  Substance Use Topics  . Alcohol use: Not Currently    Comment: quit over 15 yrs ago  . Drug use: Not Currently    Pertinent Clinical Results:  LABS: Labs reviewed: Acceptable for surgery.  Hospital Outpatient Visit on 05/16/2020  Component Date Value Ref Range Status  . aPTT 05/16/2020 31  24 - 36 seconds Final   Performed at [facility], [address] [place], [place] [address]  . Sodium 05/16/2020 140  135 - 145 mmol/L Final  . Potassium 05/16/2020 4.3  3.5 - 5.1 mmol/L Final  . Chloride 05/16/2020 99  98 - 111 mmol/L Final  . CO2 05/16/2020 32  22 - 32 mmol/L Final  . Glucose, Bld 05/16/2020 105* 70 - 99 mg/dL Final   Glucose reference range applies only to samples taken after fasting for at least 8 hours.  . BUN 05/16/2020 18  8 - 23 mg/dL Final  . Creatinine, Ser 05/16/2020 1.01  0.61 - 1.24 mg/dL Final  . Calcium 60/45/409808/18/2021 9.3  8.9 - 10.3 mg/dL Final  . GFR calc non Af Amer 05/16/2020 >60  >60 mL/min Final  . GFR calc Af Amer 05/16/2020 >60  >60 mL/min Final  . Anion gap 05/16/2020 9  5 - 15 Final   Performed at [facility], [address] [place], [place] [address]  . WBC 05/16/2020 10.9* 4.0 - 10.5 K/uL Final  . RBC 05/16/2020 5.14  4.22 - 5.81 MIL/uL Final  . Hemoglobin 05/16/2020 15.7  13.0 - 17.0 g/dL Final  . HCT 78/29/562108/18/2021 46.8  39 - 52 % Final  . MCV 05/16/2020 91.1  80.0 - 100.0 fL Final  . MCH 05/16/2020 30.5  26.0 - 34.0 pg Final  . MCHC 05/16/2020 33.5  30.0 - 36.0 g/dL Final  . RDW 30/86/578408/18/2021 15.2  11.5 - 15.5 % Final  . Platelets 05/16/2020 214  150 - 400 K/uL Final  . nRBC 05/16/2020 0.0  0.0 - 0.2 % Final  . Neutrophils Relative % 05/16/2020 77  % Final  . Neutro Abs 05/16/2020 8.3* 1.7 - 7.7 K/uL Final  . Lymphocytes Relative 05/16/2020 10  % Final  . Lymphs Abs 05/16/2020 1.1  0.7 - 4.0 K/uL Final  . Monocytes Relative 05/16/2020 10  % Final  . Monocytes Absolute 05/16/2020 1.0  0 - 1 K/uL Final  . Eosinophils Relative 05/16/2020 3  % Final  . Eosinophils Absolute 05/16/2020 0.4  0 - 0 K/uL Final  . Basophils Relative 05/16/2020 0  % Final  . Basophils Absolute 05/16/2020 0.0  0 - 0 K/uL Final  . Immature Granulocytes 05/16/2020 0  % Final  . Abs Immature Granulocytes 05/16/2020 0.03  0.00 - 0.07 K/uL Final   Performed at [facility], [address] [place], [place] [address]    . Prothrombin Time 05/16/2020 12.8  11.4 - 15.2 seconds Final  . INR 05/16/2020 1.0  0.8 - 1.2 Final   Comment: (NOTE) INR goal varies based on device and disease states. Performed at [facility], [address] [place], [place] [address]   . ABO/RH(D) 05/16/2020 A NEG   Final  . Antibody Screen 05/16/2020 NEG   Final  . Sample Expiration 05/16/2020 05/30/2020,2359   Final  . Extend sample  reason 05/16/2020    Final                   Value:NO TRANSFUSIONS OR PREGNANCY IN THE PAST 3 MONTHS Performed at [facility], [address] [place], [place] [address]     ECG: Date: 02/13/2020 Rate: 74 bpm Rhythm:  SR with PACs; IRBBB Axis (leads I and aVF): Right axis deviation Intervals: PR 144 ms. QTc 486 ms. ST segment and T wave changes: No evidence of acute ST segment elevation or depression Comparison: Similar to previous tracing obtained on 12/13/2019.   IMAGING / PROCEDURES: LEFT HEART CATHETERIZATION done on 01/23/2020 1. Mildly depressed left ventricular function with inferior hypokinesis EF around 45 to 50% 2. Severe multivessel coronary disease  LM lesion is 90% stenosed.  Ost 1st Diag lesion is 90% stenosed.  Ramus lesion is 90% stenosed.  Ost Cx lesion is 75% stenosed.  Prox RCA lesion is 50% stenosed.  Mid RCA lesion is 75% stenosed.  Dist RCA-1 lesion is 75% stenosed.  Dist RCA-2 lesion is 80% stenosed.  RPAV-2 lesion is 80% stenosed.  RPAV-1 lesion is 90% stenosed.  Mid LM lesion is 90% stenosed.  Ost LAD lesion is 90% stenosed.  Origin lesion is 100% stenosed.  Mid LAD lesion is 100% stenosed. 3. Patent grafts LIMA to the LAD SVG to distal RCA SVG to ramus 4. Occluded graft to distal circumflex  ECHOCARDIOGRAM done on 01/11/2020 1. LVEF 50-55% 2. Normal LV systolic function with mild LVH 3. Normal RV systolic function 4. Trivial MR, TR 5. No valvular stenosis 6. Mild diastolic dysfunction  LEXISCAN done on  01/11/2020 1. Abnormal myocardial perfusion scan evidence of mild reduced left ventricular function around 49% with inferior hypokinesis.   2. Evidence of moderate to large inferior defect with redistribution suggestive of ischemia in the inferior segment. 3. Significant RV uptake as well which is concerning for RVH.   4. This is a high risk study.  Impression and Plan:  Dennis Preston has been referred for pre-anesthesia review and clearance prior him undergoing the planned anesthetic and procedural courses. Available labs, pertinent testing, and imaging results were personally reviewed by me. This patient has been appropriately cleared by [facility] and cardiology. Both specialties indicate that patient is at HIGH RISK for complications based on his significant cardiopulmonary history and other known co-morbidities.    Based on clinical review performed today (05/18/20), barring any significant acute changes in the patient's overall condition, it is anticipated that he will be able to proceed with the planned surgical intervention. Any acute changes in clinical condition may necessitate his procedure being postponed and/or cancelled. Pre-surgical instructions were reviewed with the patient during his PAT appointment and questions were fielded by PAT clinical staff.  Quentin Mulling, MSN, APRN, FNP-C, CEN [facility]  Peri-operative Services Nurse Practitioner Phone: [phone] 05/18/20 1:33 PM  NOTE: This note has been prepared using Dragon dictation software. Despite my best ability to proofread, there is always the potential that unintentional transcriptional errors may still occur from this process.

## 2020-05-16 NOTE — Patient Instructions (Signed)
Your procedure is scheduled on: Wed. 8/25 Report to Day Surgery. To find out your arrival time please call [phone] between 1PM - 3PM on Tues 8/24.  Remember: Instructions that are not followed completely may result in serious medical risk,  up to and including death, or upon the discretion of your surgeon and anesthesiologist your  surgery may need to be rescheduled.     _X__ 1. Do not eat food after midnight the night before your procedure.                 No chewing gum or hard candies. You may drink clear liquids up to 2 hours                 before you are scheduled to arrive for your surgery- DO not drink clear                 liquids within 2 hours of the start of your surgery.                 Clear Liquids include:  water, apple juice without pulp, clear Gatorade, G2 or                  Gatorade Zero (avoid Red/Purple/Blue), Black Coffee or Tea (Do not add                 anything to coffee or tea). _____2.   Complete the "Ensure Clear Pre-surgery Clear Carbohydrate Drink" provided to you, 2 hours before arrival. **If you       are diabetic you will be provided with an alternative drink, Gatorade Zero or G2.  __X__2.  On the morning of surgery brush your teeth with toothpaste and water, you                may rinse your mouth with mouthwash if you wish.  Do not swallow any toothpaste of mouthwash.     __ 3.  No Alcohol for 24 hours before or after surgery.   ___ 4.  Do Not Smoke or use e-cigarettes For 24 Hours Prior to Your Surgery.                 Do not use any chewable tobacco products for at least 6 hours prior to                 Surgery.  ___  5.  Do not use any recreational drugs (marijuana, cocaine, heroin, ecstasy, MDMA or other)                For at least one week prior to your surgery.  Combination of these drugs with anesthesia                May have life threatening results.  ____  6.  Bring all medications with you on the day of  surgery if instructed.   _x___  7.  Notify your doctor if there is any change in your medical condition      (cold, fever, infections).     Do not wear jewelry, Do not wear lotions, You may wear deodorant. Do not shave 48 hours prior to surgery. Men may shave face and neck. Do not bring valuables to the hospital.    [facility] is not responsible for any belongings or valuables.  Contacts, dentures or bridgework may not be worn into surgery. Leave your suitcase in the car. After surgery it may be  brought to your room. For patients admitted to the hospital, discharge time is determined by your treatment team.   Patients discharged the day of surgery will not be allowed to drive home.   Make arrangements for someone to be with you for the first 24 hours of your Same Day Discharge.    Please read over the following fact sheets that you were given:    __x__ Take these medicines the morning of surgery with A SIP OF WATER:    1. none  2.   3.   4.  5.  6.  ____ Fleet Enema (as directed)   _x___ Use CHG Soap (or wipes) as directed  ____ Use Benzoyl Peroxide Gel as instructed  _x___ Use inhalers on the day of surgeryFluticasone-Umeclidin-Vilant (TRELEGY ELLIPTA) 100-62.5-25 MCG/INH AEPB, albuterol (VENTOLIN HFA) 108 (90 Base) MCG/ACT inhaler ( if you have it) and bring it with you  ____ Stop metformin 2 days prior to surgery    ____ Take 1/2 of usual insulin dose the night before surgery. No insulin the morning          of surgery.   __x__ No Plavix the night before the surgery  ____ Stop Anti-inflammatories     May take tylenol   ____ Stop supplements until after surgery.    ____ Bring C-Pap to the hospital.    If you have any questions regarding your pre-procedure instructions,  Please call Pre-admit Testing at [phone]

## 2020-05-17 ENCOUNTER — Telehealth (INDEPENDENT_AMBULATORY_CARE_PROVIDER_SITE_OTHER): Payer: Self-pay

## 2020-05-17 NOTE — Telephone Encounter (Signed)
Patient called to say that at his pre-op appt he was told to continue his Plavix up to the day before his surgery and he wanted to be sure. I asked if he was still taking an Aspirin daily and the patient stated he was not taking the Aspirin, that he had stopped because of excess bleeding. I advised that if he was still taking an Aspirin daily along with Plavix then he would stop 5 days before, if only taking Plavix then to stop the day before surgery.

## 2020-05-21 ENCOUNTER — Other Ambulatory Visit
Admission: RE | Admit: 2020-05-21 | Discharge: 2020-05-21 | Disposition: A | Discharge status: Home / Self Care | Payer: Medicare Other | Source: Ambulatory Visit | Attending: Vascular Surgery | Admitting: Vascular Surgery

## 2020-05-21 ENCOUNTER — Other Ambulatory Visit: Payer: Self-pay

## 2020-05-21 DIAGNOSIS — Z01812 Encounter for preprocedural laboratory examination: Secondary | ICD-10-CM | POA: Insufficient documentation

## 2020-05-21 DIAGNOSIS — Z20822 Contact with and (suspected) exposure to covid-19: Secondary | ICD-10-CM | POA: Insufficient documentation

## 2020-05-21 LAB — SARS CORONAVIRUS 2 (TAT 6-24 HRS): SARS Coronavirus 2: NEGATIVE

## 2020-05-23 ENCOUNTER — Other Ambulatory Visit: Payer: Self-pay

## 2020-05-23 ENCOUNTER — Inpatient Hospital Stay: Payer: Medicare Other | Admitting: Urgent Care

## 2020-05-23 ENCOUNTER — Inpatient Hospital Stay: Payer: Medicare Other

## 2020-05-23 ENCOUNTER — Inpatient Hospital Stay
Admission: RE | Admit: 2020-05-23 | Discharge: 2020-05-28 | DRG: 253 | Disposition: A | Discharge status: Skilled Nursing Facility | Payer: Medicare Other | Attending: Vascular Surgery | Admitting: Vascular Surgery

## 2020-05-23 ENCOUNTER — Encounter: Payer: Self-pay | Admitting: Vascular Surgery

## 2020-05-23 ENCOUNTER — Encounter
Admission: RE | Disposition: A | Discharge status: Skilled Nursing Facility | Payer: Self-pay | Source: Home / Self Care | Attending: Vascular Surgery

## 2020-05-23 DIAGNOSIS — Z951 Presence of aortocoronary bypass graft: Secondary | ICD-10-CM

## 2020-05-23 DIAGNOSIS — I700 Atherosclerosis of aorta: Secondary | ICD-10-CM | POA: Diagnosis present

## 2020-05-23 DIAGNOSIS — I9581 Postprocedural hypotension: Secondary | ICD-10-CM | POA: Diagnosis not present

## 2020-05-23 DIAGNOSIS — Z87891 Personal history of nicotine dependence: Secondary | ICD-10-CM

## 2020-05-23 DIAGNOSIS — E785 Hyperlipidemia, unspecified: Secondary | ICD-10-CM | POA: Diagnosis present

## 2020-05-23 DIAGNOSIS — I70203 Unspecified atherosclerosis of native arteries of extremities, bilateral legs: Secondary | ICD-10-CM | POA: Diagnosis present

## 2020-05-23 DIAGNOSIS — J449 Chronic obstructive pulmonary disease, unspecified: Secondary | ICD-10-CM | POA: Diagnosis present

## 2020-05-23 DIAGNOSIS — I252 Old myocardial infarction: Secondary | ICD-10-CM

## 2020-05-23 DIAGNOSIS — I10 Essential (primary) hypertension: Secondary | ICD-10-CM

## 2020-05-23 DIAGNOSIS — I70229 Atherosclerosis of native arteries of extremities with rest pain, unspecified extremity: Secondary | ICD-10-CM | POA: Diagnosis not present

## 2020-05-23 DIAGNOSIS — I25118 Atherosclerotic heart disease of native coronary artery with other forms of angina pectoris: Secondary | ICD-10-CM | POA: Diagnosis present

## 2020-05-23 DIAGNOSIS — I70223 Atherosclerosis of native arteries of extremities with rest pain, bilateral legs: Secondary | ICD-10-CM

## 2020-05-23 DIAGNOSIS — F329 Major depressive disorder, single episode, unspecified: Secondary | ICD-10-CM | POA: Diagnosis present

## 2020-05-23 DIAGNOSIS — I5042 Chronic combined systolic (congestive) and diastolic (congestive) heart failure: Secondary | ICD-10-CM | POA: Diagnosis present

## 2020-05-23 DIAGNOSIS — I110 Hypertensive heart disease with heart failure: Secondary | ICD-10-CM | POA: Diagnosis present

## 2020-05-23 DIAGNOSIS — Z20822 Contact with and (suspected) exposure to covid-19: Secondary | ICD-10-CM | POA: Diagnosis present

## 2020-05-23 DIAGNOSIS — Z7982 Long term (current) use of aspirin: Secondary | ICD-10-CM | POA: Diagnosis not present

## 2020-05-23 DIAGNOSIS — I6523 Occlusion and stenosis of bilateral carotid arteries: Secondary | ICD-10-CM | POA: Diagnosis present

## 2020-05-23 DIAGNOSIS — Z7902 Long term (current) use of antithrombotics/antiplatelets: Secondary | ICD-10-CM

## 2020-05-23 DIAGNOSIS — Z79899 Other long term (current) drug therapy: Secondary | ICD-10-CM | POA: Diagnosis not present

## 2020-05-23 DIAGNOSIS — I70222 Atherosclerosis of native arteries of extremities with rest pain, left leg: Secondary | ICD-10-CM | POA: Diagnosis not present

## 2020-05-23 HISTORY — PX: ENDARTERECTOMY FEMORAL: SHX5804

## 2020-05-23 HISTORY — PX: INSERTION OF ILIAC STENT: SHX6256

## 2020-05-23 LAB — ABO/RH: ABO/RH(D): A NEG

## 2020-05-23 SURGERY — ENDARTERECTOMY, FEMORAL
Anesthesia: General | Site: Groin | Laterality: Left

## 2020-05-23 MED ORDER — ONDANSETRON HCL 4 MG/2ML IJ SOLN
INTRAMUSCULAR | Status: AC
  Filled 2020-05-23: qty 2

## 2020-05-23 MED ORDER — MORPHINE SULFATE (PF) 4 MG/ML IV SOLN: 2.0000 mg | INTRAVENOUS | Status: DC | PRN

## 2020-05-23 MED ORDER — DEXAMETHASONE SODIUM PHOSPHATE 10 MG/ML IJ SOLN
INTRAMUSCULAR | Status: AC
  Filled 2020-05-23: qty 1

## 2020-05-23 MED ORDER — HEPARIN SODIUM (PORCINE) 1000 UNIT/ML IJ SOLN
INTRAMUSCULAR | Status: DC | PRN
  Administered 2020-05-23: 2000 [IU] via INTRAVENOUS
  Administered 2020-05-23: 6000 [IU] via INTRAVENOUS
  Administered 2020-05-23: 1000 [IU] via INTRAVENOUS

## 2020-05-23 MED ORDER — ROCURONIUM BROMIDE 100 MG/10ML IV SOLN
INTRAVENOUS | Status: DC | PRN
  Administered 2020-05-23: 10 mg via INTRAVENOUS
  Administered 2020-05-23: 20 mg via INTRAVENOUS
  Administered 2020-05-23: 50 mg via INTRAVENOUS
  Administered 2020-05-23: 30 mg via INTRAVENOUS
  Administered 2020-05-23: 20 mg via INTRAVENOUS
  Administered 2020-05-23: 10 mg via INTRAVENOUS
  Administered 2020-05-23: 20 mg via INTRAVENOUS

## 2020-05-23 MED ORDER — HYDRALAZINE HCL 20 MG/ML IJ SOLN: 5.0000 mg | INTRAMUSCULAR | Status: DC | PRN

## 2020-05-23 MED ORDER — SENNOSIDES-DOCUSATE SODIUM 8.6-50 MG PO TABS
1.0000 | ORAL_TABLET | Freq: Every evening | ORAL | Status: DC | PRN
  Filled 2020-05-23: qty 1

## 2020-05-23 MED ORDER — DEXAMETHASONE SODIUM PHOSPHATE 10 MG/ML IJ SOLN
INTRAMUSCULAR | Status: DC | PRN
  Administered 2020-05-23: 10 mg via INTRAVENOUS

## 2020-05-23 MED ORDER — PROPOFOL 10 MG/ML IV BOLUS
INTRAVENOUS | Status: AC
  Filled 2020-05-23: qty 20

## 2020-05-23 MED ORDER — CEFAZOLIN SODIUM-DEXTROSE 2-4 GM/100ML-% IV SOLN
2.0000 g | INTRAVENOUS | Status: AC
  Administered 2020-05-23: 2 g via INTRAVENOUS

## 2020-05-23 MED ORDER — FENTANYL CITRATE (PF) 100 MCG/2ML IJ SOLN
INTRAMUSCULAR | Status: AC
  Filled 2020-05-23: qty 2

## 2020-05-23 MED ORDER — METOPROLOL TARTRATE 5 MG/5ML IV SOLN: 2.0000 mg | INTRAVENOUS | Status: DC | PRN

## 2020-05-23 MED ORDER — LACTATED RINGERS IV SOLN: INTRAVENOUS | Status: DC

## 2020-05-23 MED ORDER — MAGNESIUM SULFATE 2 GM/50ML IV SOLN
2.0000 g | Freq: Every day | INTRAVENOUS | Status: DC | PRN
  Filled 2020-05-23: qty 50

## 2020-05-23 MED ORDER — EPHEDRINE 5 MG/ML INJ
INTRAVENOUS | Status: AC
  Filled 2020-05-23: qty 10

## 2020-05-23 MED ORDER — HEPARIN SODIUM (PORCINE) 5000 UNIT/ML IJ SOLN
INTRAMUSCULAR | Status: AC
  Filled 2020-05-23: qty 1

## 2020-05-23 MED ORDER — LABETALOL HCL 5 MG/ML IV SOLN: 10.0000 mg | INTRAVENOUS | Status: DC | PRN

## 2020-05-23 MED ORDER — CEFAZOLIN SODIUM-DEXTROSE 2-4 GM/100ML-% IV SOLN
INTRAVENOUS | Status: AC
  Administered 2020-05-23: 2 g via INTRAVENOUS
  Filled 2020-05-23: qty 100

## 2020-05-23 MED ORDER — FUROSEMIDE 40 MG PO TABS
40.0000 mg | ORAL_TABLET | Freq: Every day | ORAL | Status: DC
  Administered 2020-05-24 – 2020-05-28 (×5): 40 mg via ORAL
  Filled 2020-05-23 (×5): qty 1

## 2020-05-23 MED ORDER — MIDAZOLAM HCL 2 MG/2ML IJ SOLN
INTRAMUSCULAR | Status: DC | PRN
  Administered 2020-05-23: 1 mg via INTRAVENOUS

## 2020-05-23 MED ORDER — ASPIRIN 81 MG PO CHEW
81.0000 mg | CHEWABLE_TABLET | Freq: Every day | ORAL | Status: DC
  Administered 2020-05-25: 81 mg via ORAL
  Filled 2020-05-23: qty 1

## 2020-05-23 MED ORDER — DOPAMINE-DEXTROSE 3.2-5 MG/ML-% IV SOLN: 3.0000 ug/kg/min | INTRAVENOUS | Status: DC

## 2020-05-23 MED ORDER — FLUTICASONE-UMECLIDIN-VILANT 100-62.5-25 MCG/INH IN AEPB: 1.0000 | INHALATION_SPRAY | RESPIRATORY_TRACT | Status: DC

## 2020-05-23 MED ORDER — UMECLIDINIUM-VILANTEROL 62.5-25 MCG/INH IN AEPB
1.0000 | INHALATION_SPRAY | Freq: Every day | RESPIRATORY_TRACT | Status: DC
  Administered 2020-05-24 – 2020-05-28 (×5): 1 via RESPIRATORY_TRACT
  Filled 2020-05-23 (×3): qty 14

## 2020-05-23 MED ORDER — KETAMINE HCL 50 MG/ML IJ SOLN
INTRAMUSCULAR | Status: DC | PRN
  Administered 2020-05-23: 50 mg via INTRAMUSCULAR

## 2020-05-23 MED ORDER — MIDAZOLAM HCL 2 MG/2ML IJ SOLN
INTRAMUSCULAR | Status: AC
  Filled 2020-05-23: qty 2

## 2020-05-23 MED ORDER — SACUBITRIL-VALSARTAN 24-26 MG PO TABS
1.0000 | ORAL_TABLET | Freq: Two times a day (BID) | ORAL | Status: DC
  Administered 2020-05-23: 1 via ORAL
  Filled 2020-05-23 (×6): qty 1

## 2020-05-23 MED ORDER — ACETAMINOPHEN 10 MG/ML IV SOLN
INTRAVENOUS | Status: AC
  Filled 2020-05-23: qty 100

## 2020-05-23 MED ORDER — SODIUM CHLORIDE 0.9 % IV SOLN
INTRAVENOUS | Status: DC | PRN
  Administered 2020-05-23: 16:00:00 200 mL via INTRAMUSCULAR

## 2020-05-23 MED ORDER — NITROGLYCERIN IN D5W 200-5 MCG/ML-% IV SOLN: 5.0000 ug/min | INTRAVENOUS | Status: DC

## 2020-05-23 MED ORDER — PHENOL 1.4 % MT LIQD
1.0000 | OROMUCOSAL | Status: DC | PRN
  Filled 2020-05-23: qty 177

## 2020-05-23 MED ORDER — FENTANYL CITRATE (PF) 100 MCG/2ML IJ SOLN: 25.0000 ug | INTRAMUSCULAR | Status: DC | PRN

## 2020-05-23 MED ORDER — SORBITOL 70 % SOLN
30.0000 mL | Freq: Every day | Status: DC | PRN
  Filled 2020-05-23: qty 30

## 2020-05-23 MED ORDER — BUPIVACAINE LIPOSOME 1.3 % IJ SUSP
INTRAMUSCULAR | Status: AC
  Filled 2020-05-23: qty 20

## 2020-05-23 MED ORDER — LIDOCAINE HCL (PF) 2 % IJ SOLN
INTRAMUSCULAR | Status: AC
  Filled 2020-05-23: qty 5

## 2020-05-23 MED ORDER — POTASSIUM CHLORIDE CRYS ER 20 MEQ PO TBCR: 20.0000 meq | EXTENDED_RELEASE_TABLET | Freq: Every day | ORAL | Status: DC | PRN

## 2020-05-23 MED ORDER — ALBUTEROL SULFATE (2.5 MG/3ML) 0.083% IN NEBU: 2.5000 mg | INHALATION_SOLUTION | Freq: Four times a day (QID) | RESPIRATORY_TRACT | Status: DC | PRN

## 2020-05-23 MED ORDER — VISTASEAL 10 ML SINGLE DOSE KIT
PACK | CUTANEOUS | Status: DC | PRN
  Administered 2020-05-23: 10 mL via TOPICAL

## 2020-05-23 MED ORDER — CHLORHEXIDINE GLUCONATE 0.12 % MT SOLN: 15.0000 mL | Freq: Once | OROMUCOSAL | Status: AC

## 2020-05-23 MED ORDER — FAMOTIDINE 20 MG PO TABS: 20.0000 mg | ORAL_TABLET | Freq: Once | ORAL | Status: AC

## 2020-05-23 MED ORDER — FAMOTIDINE 20 MG PO TABS
ORAL_TABLET | ORAL | Status: AC
  Administered 2020-05-23: 20 mg via ORAL
  Filled 2020-05-23: qty 1

## 2020-05-23 MED ORDER — SODIUM CHLORIDE 0.9 % IV SOLN: INTRAVENOUS | Status: DC

## 2020-05-23 MED ORDER — ATORVASTATIN CALCIUM 20 MG PO TABS
80.0000 mg | ORAL_TABLET | Freq: Every day | ORAL | Status: DC
  Administered 2020-05-23 – 2020-05-27 (×5): 80 mg via ORAL
  Filled 2020-05-23 (×4): qty 4
  Filled 2020-05-23 (×2): qty 1

## 2020-05-23 MED ORDER — BUPIVACAINE HCL (PF) 0.5 % IJ SOLN
INTRAMUSCULAR | Status: DC | PRN
  Administered 2020-05-23: 30 mL

## 2020-05-23 MED ORDER — SODIUM CHLORIDE 0.9 % IV SOLN
500.0000 mL | Freq: Once | INTRAVENOUS | Status: AC | PRN
  Administered 2020-05-23: 500 mL via INTRAVENOUS

## 2020-05-23 MED ORDER — ACETAMINOPHEN 325 MG PO TABS: 650.0000 mg | ORAL_TABLET | Freq: Four times a day (QID) | ORAL | Status: DC | PRN

## 2020-05-23 MED ORDER — DOCUSATE SODIUM 100 MG PO CAPS
100.0000 mg | ORAL_CAPSULE | Freq: Every day | ORAL | Status: DC
  Administered 2020-05-24 – 2020-05-28 (×5): 100 mg via ORAL
  Filled 2020-05-23 (×6): qty 1

## 2020-05-23 MED ORDER — ASPIRIN 81 MG PO CHEW
CHEWABLE_TABLET | ORAL | Status: AC
  Administered 2020-05-23: 81 mg via ORAL
  Filled 2020-05-23: qty 1

## 2020-05-23 MED ORDER — BUPIVACAINE LIPOSOME 1.3 % IJ SUSP
INTRAMUSCULAR | Status: DC | PRN
  Administered 2020-05-23: 20 mL

## 2020-05-23 MED ORDER — FAMOTIDINE IN NACL 20-0.9 MG/50ML-% IV SOLN
20.0000 mg | Freq: Two times a day (BID) | INTRAVENOUS | Status: DC
  Administered 2020-05-23 – 2020-05-28 (×10): 20 mg via INTRAVENOUS
  Filled 2020-05-23 (×11): qty 50

## 2020-05-23 MED ORDER — CHLORHEXIDINE GLUCONATE 0.12 % MT SOLN
OROMUCOSAL | Status: AC
  Administered 2020-05-23: 15 mL via OROMUCOSAL
  Filled 2020-05-23: qty 15

## 2020-05-23 MED ORDER — ROCURONIUM BROMIDE 10 MG/ML (PF) SYRINGE
PREFILLED_SYRINGE | INTRAVENOUS | Status: AC
  Filled 2020-05-23: qty 10

## 2020-05-23 MED ORDER — EPHEDRINE SULFATE 50 MG/ML IJ SOLN
INTRAMUSCULAR | Status: DC | PRN
  Administered 2020-05-23 (×2): 10 mg via INTRAVENOUS
  Administered 2020-05-23: 5 mg via INTRAVENOUS
  Administered 2020-05-23 (×2): 10 mg via INTRAVENOUS
  Administered 2020-05-23: 5 mg via INTRAVENOUS
  Administered 2020-05-23: 10 mg via INTRAVENOUS
  Administered 2020-05-23 (×3): 5 mg via INTRAVENOUS
  Administered 2020-05-23: 10 mg via INTRAVENOUS

## 2020-05-23 MED ORDER — OXYCODONE-ACETAMINOPHEN 5-325 MG PO TABS
1.0000 | ORAL_TABLET | ORAL | Status: DC | PRN
  Administered 2020-05-24 – 2020-05-26 (×4): 2 via ORAL
  Administered 2020-05-27 (×2): 1 via ORAL
  Filled 2020-05-23 (×2): qty 1
  Filled 2020-05-23 (×3): qty 2
  Filled 2020-05-23: qty 1

## 2020-05-23 MED ORDER — BUPIVACAINE HCL (PF) 0.5 % IJ SOLN
INTRAMUSCULAR | Status: AC
  Filled 2020-05-23: qty 30

## 2020-05-23 MED ORDER — CEFAZOLIN SODIUM-DEXTROSE 2-4 GM/100ML-% IV SOLN: 2.0000 g | Freq: Three times a day (TID) | INTRAVENOUS | Status: AC

## 2020-05-23 MED ORDER — SUGAMMADEX SODIUM 200 MG/2ML IV SOLN
INTRAVENOUS | Status: DC | PRN
  Administered 2020-05-23: 150 mg via INTRAVENOUS

## 2020-05-23 MED ORDER — ACETAMINOPHEN 650 MG RE SUPP: 325.0000 mg | RECTAL | Status: DC | PRN

## 2020-05-23 MED ORDER — ORAL CARE MOUTH RINSE: 15.0000 mL | Freq: Once | OROMUCOSAL | Status: AC

## 2020-05-23 MED ORDER — ALBUTEROL SULFATE (2.5 MG/3ML) 0.083% IN NEBU
2.5000 mg | INHALATION_SOLUTION | Freq: Four times a day (QID) | RESPIRATORY_TRACT | Status: DC | PRN
  Administered 2020-05-24 – 2020-05-25 (×2): 2.5 mg via RESPIRATORY_TRACT
  Filled 2020-05-23 (×2): qty 3

## 2020-05-23 MED ORDER — PROPOFOL 10 MG/ML IV BOLUS
INTRAVENOUS | Status: DC | PRN
  Administered 2020-05-23: 100 mg via INTRAVENOUS

## 2020-05-23 MED ORDER — FENTANYL CITRATE (PF) 100 MCG/2ML IJ SOLN
INTRAMUSCULAR | Status: DC | PRN
Start: 2020-05-23 — End: 2020-05-23
  Administered 2020-05-23: 25 ug via INTRAVENOUS
  Administered 2020-05-23: 50 ug via INTRAVENOUS
  Administered 2020-05-23: 25 ug via INTRAVENOUS

## 2020-05-23 MED ORDER — CHLORHEXIDINE GLUCONATE CLOTH 2 % EX PADS
6.0000 | MEDICATED_PAD | Freq: Once | CUTANEOUS | Status: DC
  Administered 2020-05-23: 6 via TOPICAL

## 2020-05-23 MED ORDER — LIDOCAINE HCL (CARDIAC) PF 100 MG/5ML IV SOSY
PREFILLED_SYRINGE | INTRAVENOUS | Status: DC | PRN
  Administered 2020-05-23: 40 mg via INTRAVENOUS
  Administered 2020-05-23: 60 mg via INTRAVENOUS

## 2020-05-23 MED ORDER — ONDANSETRON HCL 4 MG/2ML IJ SOLN: 4.0000 mg | Freq: Once | INTRAMUSCULAR | Status: DC | PRN

## 2020-05-23 MED ORDER — ONDANSETRON HCL 4 MG/2ML IJ SOLN: 4.0000 mg | Freq: Four times a day (QID) | INTRAMUSCULAR | Status: DC | PRN

## 2020-05-23 MED ORDER — GUAIFENESIN-DM 100-10 MG/5ML PO SYRP
15.0000 mL | ORAL_SOLUTION | ORAL | Status: DC | PRN
  Administered 2020-05-24 – 2020-05-25 (×3): 15 mL via ORAL
  Filled 2020-05-23 (×7): qty 15

## 2020-05-23 MED ORDER — CEFAZOLIN SODIUM-DEXTROSE 2-4 GM/100ML-% IV SOLN
INTRAVENOUS | Status: AC
  Filled 2020-05-23: qty 100

## 2020-05-23 MED ORDER — ALUM & MAG HYDROXIDE-SIMETH 200-200-20 MG/5ML PO SUSP: 15.0000 mL | ORAL | Status: DC | PRN

## 2020-05-23 MED ORDER — HEPARIN SODIUM (PORCINE) 1000 UNIT/ML IJ SOLN
INTRAMUSCULAR | Status: AC
  Filled 2020-05-23: qty 1

## 2020-05-23 MED ORDER — PHENYLEPHRINE HCL (PRESSORS) 10 MG/ML IV SOLN
INTRAVENOUS | Status: DC | PRN
  Administered 2020-05-23 (×3): 100 ug via INTRAVENOUS
  Administered 2020-05-23: 200 ug via INTRAVENOUS
  Administered 2020-05-23 (×3): 100 ug via INTRAVENOUS

## 2020-05-23 MED ORDER — SODIUM CHLORIDE 0.9 % IV SOLN
INTRAVENOUS | Status: DC | PRN
  Administered 2020-05-23: 10 ug/min via INTRAVENOUS

## 2020-05-23 MED ORDER — CLOPIDOGREL BISULFATE 75 MG PO TABS
75.0000 mg | ORAL_TABLET | Freq: Every evening | ORAL | Status: DC
  Administered 2020-05-23 – 2020-05-27 (×4): 75 mg via ORAL
  Filled 2020-05-23 (×5): qty 1

## 2020-05-23 MED ORDER — ACETAMINOPHEN 325 MG PO TABS
325.0000 mg | ORAL_TABLET | ORAL | Status: DC | PRN
  Administered 2020-05-25 – 2020-05-28 (×2): 650 mg via ORAL
  Filled 2020-05-23 (×2): qty 2

## 2020-05-23 MED ORDER — ACETAMINOPHEN 10 MG/ML IV SOLN
INTRAVENOUS | Status: DC | PRN
  Administered 2020-05-23: 1000 mg via INTRAVENOUS

## 2020-05-23 SURGICAL SUPPLY — 102 items
ADH SKN CLS APL DERMABOND .7 (GAUZE/BANDAGES/DRESSINGS) ×2
APL PRP STRL LF DISP 70% ISPRP (MISCELLANEOUS) ×4
APPLIER CLIP 11 MED OPEN (CLIP)
APPLIER CLIP 9.375 SM OPEN (CLIP)
APR CLP MED 11 20 MLT OPN (CLIP)
APR CLP SM 9.3 20 MLT OPN (CLIP)
BAG COUNTER SPONGE EZ (MISCELLANEOUS) ×2 IMPLANT
BAG DECANTER FOR FLEXI CONT (MISCELLANEOUS) ×4 IMPLANT
BAG SPNG 4X4 CLR HAZ (MISCELLANEOUS)
BALLN DORADO 6X80X80 (BALLOONS) ×4
BALLN LUTONIX 018 4X300X130 (BALLOONS) ×4
BALLN LUTONIX 018 5X300X130 (BALLOONS) ×4
BALLN LUTONIX 018 5X40X130 (BALLOONS) ×8
BALLN LUTONIX 4X220X130 (BALLOONS) ×4
BALLN LUTONIX AV 7X60X75 (BALLOONS) ×8
BALLOON DORADO 6X80X80 (BALLOONS) IMPLANT
BALLOON LUTONIX 018 4X300X130 (BALLOONS) IMPLANT
BALLOON LUTONIX 018 5X300X130 (BALLOONS) ×1 IMPLANT
BALLOON LUTONIX 018 5X40X130 (BALLOONS) IMPLANT
BALLOON LUTONIX 4X220X130 (BALLOONS) IMPLANT
BALLOON LUTONIX AV 7X60X75 (BALLOONS) ×2 IMPLANT
BLADE SURG 15 STRL LF DISP TIS (BLADE) ×2 IMPLANT
BLADE SURG 15 STRL SS (BLADE) ×4
BLADE SURG SZ11 CARB STEEL (BLADE) ×4 IMPLANT
BOOT SUTURE AID YELLOW STND (SUTURE) ×8 IMPLANT
BRUSH SCRUB EZ  4% CHG (MISCELLANEOUS) ×4
BRUSH SCRUB EZ 4% CHG (MISCELLANEOUS) ×2 IMPLANT
CANISTER SUCT 1200ML W/VALVE (MISCELLANEOUS) ×4 IMPLANT
CATH BEACON 5 .035 65 KMP TIP (CATHETERS) ×3 IMPLANT
CHLORAPREP W/TINT 26 (MISCELLANEOUS) ×8 IMPLANT
CLIP APPLIE 11 MED OPEN (CLIP) IMPLANT
CLIP APPLIE 9.375 SM OPEN (CLIP) IMPLANT
COUNTER SPONGE BAG EZ (MISCELLANEOUS)
COVER WAND RF STERILE (DRAPES) ×4 IMPLANT
DERMABOND ADVANCED (GAUZE/BANDAGES/DRESSINGS) ×2
DERMABOND ADVANCED .7 DNX12 (GAUZE/BANDAGES/DRESSINGS) ×2 IMPLANT
DEVICE INFLAT 30 PLUS (MISCELLANEOUS) ×6 IMPLANT
DRAPE INCISE IOBAN 66X45 STRL (DRAPES) ×4 IMPLANT
DRESSING SURGICEL FIBRLLR 1X2 (HEMOSTASIS) ×2 IMPLANT
DRSG OPSITE POSTOP 4X6 (GAUZE/BANDAGES/DRESSINGS) ×4 IMPLANT
DRSG SURGICEL FIBRILLAR 1X2 (HEMOSTASIS) ×4
ELECT CAUTERY BLADE 6.4 (BLADE) ×4 IMPLANT
ELECT REM PT RETURN 9FT ADLT (ELECTROSURGICAL) ×4
ELECTRODE REM PT RTRN 9FT ADLT (ELECTROSURGICAL) ×2 IMPLANT
GLIDEWIRE ADV .035X180CM (WIRE) ×2 IMPLANT
GLOVE BIO SURGEON STRL SZ7 (GLOVE) ×8 IMPLANT
GLOVE INDICATOR 7.5 STRL GRN (GLOVE) ×4 IMPLANT
GLOVE SURG SYN 8.0 (GLOVE) ×4 IMPLANT
GLOVE SURG SYN 8.0 PF PI (GLOVE) ×1 IMPLANT
GOWN STRL REUS W/ TWL LRG LVL3 (GOWN DISPOSABLE) ×6 IMPLANT
GOWN STRL REUS W/ TWL XL LVL3 (GOWN DISPOSABLE) ×4 IMPLANT
GOWN STRL REUS W/TWL LRG LVL3 (GOWN DISPOSABLE) ×16
GOWN STRL REUS W/TWL XL LVL3 (GOWN DISPOSABLE) ×8
IV NS 500ML (IV SOLUTION) ×4
IV NS 500ML BAXH (IV SOLUTION) ×2 IMPLANT
KIT TURNOVER KIT A (KITS) ×4 IMPLANT
LABEL OR SOLS (LABEL) ×4 IMPLANT
LOOP RED MAXI  1X406MM (MISCELLANEOUS) ×8
LOOP VESSEL MAXI  1X406 RED (MISCELLANEOUS) ×8
LOOP VESSEL MAXI 1X406 RED (MISCELLANEOUS) ×4 IMPLANT
LOOP VESSEL MINI 0.8X406 BLUE (MISCELLANEOUS) ×6 IMPLANT
LOOPS BLUE MINI 0.8X406MM (MISCELLANEOUS) ×6
NDL HPO THNWL 1X22GA REG BVL (NEEDLE) ×2 IMPLANT
NEEDLE HYPO 18GX1.5 BLUNT FILL (NEEDLE) ×4 IMPLANT
NEEDLE SAFETY 22GX1 (NEEDLE) ×4
NS IRRIG 500ML POUR BTL (IV SOLUTION) ×4 IMPLANT
PACK ANGIOGRAPHY (CUSTOM PROCEDURE TRAY) ×2 IMPLANT
PACK BASIN MAJOR ARMC (MISCELLANEOUS) ×4 IMPLANT
PACK UNIVERSAL (MISCELLANEOUS) ×4 IMPLANT
PATCH CAROTID ECM VASC 1X10 (Prosthesis & Implant Heart) ×4 IMPLANT
PENCIL ELECTRO HAND CTR (MISCELLANEOUS) ×4 IMPLANT
SHEATH BRITE TIP 6FRX11 (SHEATH) ×4 IMPLANT
SHEATH BRITE TIP 8FRX11 (SHEATH) ×4 IMPLANT
STENT LIFESTAR 8X40 (Permanent Stent) ×2 IMPLANT
STENT LIFESTAR 8X60X80 (Permanent Stent) ×2 IMPLANT
STENT VIABAHN 6X100X120 (Permanent Stent) ×2 IMPLANT
STENT VIABAHN 6X250X120 (Permanent Stent) ×6 IMPLANT
STENT VIABAHN 7X150X120 (Permanent Stent) ×4 IMPLANT
SUT MNCRL+ 5-0 UNDYED PC-3 (SUTURE) ×2 IMPLANT
SUT MONOCRYL 5-0 (SUTURE) ×8
SUT PROLENE 5 0 RB 1 DA (SUTURE) ×8 IMPLANT
SUT PROLENE 6 0 BV (SUTURE) ×40 IMPLANT
SUT PROLENE 7 0 BV 1 (SUTURE) ×14 IMPLANT
SUT SILK 2 0 (SUTURE) ×8
SUT SILK 2-0 18XBRD TIE 12 (SUTURE) ×2 IMPLANT
SUT SILK 3 0 (SUTURE) ×4
SUT SILK 3-0 18XBRD TIE 12 (SUTURE) ×2 IMPLANT
SUT SILK 4 0 (SUTURE) ×4
SUT SILK 4-0 18XBRD TIE 12 (SUTURE) ×2 IMPLANT
SUT VIC AB 2-0 CT1 27 (SUTURE) ×8
SUT VIC AB 2-0 CT1 TAPERPNT 27 (SUTURE) ×4 IMPLANT
SUT VIC AB 2-0 SH 27 (SUTURE) ×4
SUT VIC AB 2-0 SH 27XBRD (SUTURE) IMPLANT
SUT VIC AB 3-0 SH 27 (SUTURE) ×4
SUT VIC AB 3-0 SH 27X BRD (SUTURE) ×2 IMPLANT
SUT VICRYL+ 3-0 36IN CT-1 (SUTURE) ×8 IMPLANT
SYR 20ML LL LF (SYRINGE) ×4 IMPLANT
SYR 30ML LL (SYRINGE) ×4 IMPLANT
SYR 5ML LL (SYRINGE) ×4 IMPLANT
TRAY FOLEY MTR SLVR 16FR STAT (SET/KITS/TRAYS/PACK) ×4 IMPLANT
WIRE G V18X300CM (WIRE) ×4 IMPLANT
WIRE MAGIC TOR.035 180C (WIRE) ×2 IMPLANT

## 2020-05-23 NOTE — Anesthesia Procedure Notes (Signed)
Procedure Name: Intubation Date/Time: 05/23/2020 11:44 AM Performed by: Omer Jack, CRNA Pre-anesthesia Checklist: Patient identified, Patient being monitored, Timeout performed, Emergency Drugs available and Suction available Patient Re-evaluated:Patient Re-evaluated prior to induction Oxygen Delivery Method: Circle system utilized Preoxygenation: Pre-oxygenation with 100% oxygen Induction Type: IV induction Ventilation: Mask ventilation without difficulty Laryngoscope Size: 3 and McGraph Grade View: Grade I Tube type: Oral Tube size: 7.5 mm Number of attempts: 1 Airway Equipment and Method: Stylet Placement Confirmation: ETT inserted through vocal cords under direct vision,  positive ETCO2 and breath sounds checked- equal and bilateral Secured at: 22 cm Tube secured with: Tape Dental Injury: Teeth and Oropharynx as per pre-operative assessment

## 2020-05-23 NOTE — Anesthesia Preprocedure Evaluation (Signed)
Anesthesia Evaluation  Patient identified by MRN, date of birth, ID band Patient awake    Reviewed: Allergy & Precautions, H&P , NPO status , Patient's Chart, lab work & pertinent test results, reviewed documented beta blocker date and time   Airway Mallampati: III  TM Distance: >3 FB Neck ROM: full    Dental  (+) Teeth Intact   Pulmonary COPD,  COPD inhaler, former smoker,    Pulmonary exam normal        Cardiovascular Exercise Tolerance: Poor + CAD, + Past MI, + Peripheral Vascular Disease and +CHF  Normal cardiovascular exam Rhythm:regular Rate:Normal     Neuro/Psych PSYCHIATRIC DISORDERS Anxiety Depression negative neurological ROS     GI/Hepatic negative GI ROS, Neg liver ROS,   Endo/Other  negative endocrine ROS  Renal/GU negative Renal ROS  negative genitourinary   Musculoskeletal   Abdominal   Peds  Hematology negative hematology ROS (+)   Anesthesia Other Findings Past Medical History: No date: Alcohol abuse No date: Amaurosis fugax No date: Atrial fibrillation (HCC) No date: Carotid stenosis No date: CHF (congestive heart failure) (HCC) No date: Coagulopathy (HCC) No date: COPD (chronic obstructive pulmonary disease) (HCC) No date: Coronary artery disease No date: Depression No date: Hyperlipemia No date: Hypotension No date: Leucocytosis No date: Liver dysfunction No date: Myocardial infarction (HCC)     Comment:  non-STEMI. approx 2016 No date: Peripheral artery disease (HCC)     Comment:  legs No date: Tobacco abuse No date: Wears dentures     Comment:  full upper and lower Past Surgical History: 07/10/2015: CARDIAC CATHETERIZATION; N/A     Comment:  Procedure: Right/Left Heart Cath and Coronary               Angiography;  Surgeon: Alwyn Pea, MD;  Location:               [facility] INVASIVE CV LAB;  Service: Cardiovascular;                Laterality: N/A; 07/13/2015: CORONARY ARTERY  BYPASS GRAFT     Comment:  [facility].  4 vessel 01/23/2020: LEFT HEART CATH AND CORS/GRAFTS ANGIOGRAPHY; N/A     Comment:  Procedure: LEFT HEART CATH AND CORS/GRAFTS ANGIOGRAPHY;               Surgeon: Alwyn Pea, MD;  Location: [facility] INVASIVE              CV LAB;  Service: Cardiovascular;  Laterality: N/A; 04/03/2020: LOWER EXTREMITY ANGIOGRAPHY; Right     Comment:  Procedure: LOWER EXTREMITY ANGIOGRAPHY;  Surgeon:               Renford Dills, MD;  Location: [facility] INVASIVE CV LAB;               Service: Cardiovascular;  Laterality: Right; 05/06/2019: OLECRANON BURSECTOMY; Left     Comment:  Procedure: OLECRANON BURSECTOMY AND DEBRIDEMENT;                Surgeon: Signa Kell, MD;  Location: [facility] ORS;  Service:              Orthopedics;  Laterality: Left; BMI    Body Mass Index: 26.00 kg/m     Reproductive/Obstetrics negative OB ROS                             Anesthesia Physical Anesthesia Plan  ASA: III  Anesthesia  Plan: General ETT   Post-op Pain Management:    Induction:   PONV Risk Score and Plan:   Airway Management Planned:   Additional Equipment:   Intra-op Plan:   Post-operative Plan:   Informed Consent: I have reviewed the patients History and Physical, chart, labs and discussed the procedure including the risks, benefits and alternatives for the proposed anesthesia with the patient or authorized representative who has indicated his/her understanding and acceptance.     Dental Advisory Given  Plan Discussed with: CRNA  Anesthesia Plan Comments:         Anesthesia Quick Evaluation

## 2020-05-23 NOTE — Anesthesia Procedure Notes (Signed)
Arterial Line Insertion Start/End8/25/2021 12:00 PM Performed by: Yevette Edwards, MD, Omer Jack, CRNA, CRNA  Patient location: OR. Preanesthetic checklist: patient identified, IV checked, site marked, risks and benefits discussed, surgical consent, monitors and equipment checked, pre-op evaluation, timeout performed and anesthesia consent Patient sedated Right, radial was placed Catheter size: 20 G Hand hygiene performed  and maximum sterile barriers used  Allen's test indicative of satisfactory collateral circulation Attempts: 3 Procedure performed without using ultrasound guided technique. Following insertion, dressing applied. Post procedure assessment: normal  Patient tolerated the procedure well with no immediate complications.

## 2020-05-23 NOTE — Transfer of Care (Signed)
Immediate Anesthesia Transfer of Care Note  Patient: Dennis Preston  Procedure(s) Performed: ENDARTERECTOMY FEMORAL BILATERAL SUPERFICIAL FEMORAL ARTERY STENTS  (Bilateral Groin) INSERTION OF ILIAC STENT (Left Groin)  Patient Location: PACU  Anesthesia Type:General  Level of Consciousness: drowsy and patient cooperative  Airway & Oxygen Therapy: Patient Spontanous Breathing and Patient connected to face mask oxygen  Post-op Assessment: Report given to RN and Post -op Vital signs reviewed and stable  Post vital signs: Reviewed and stable  Last Vitals:  Vitals Value Taken Time  BP 90/58 05/23/20 1633  Temp 36.4 C 05/23/20 1620  Pulse 87 05/23/20 1634  Resp 19 05/23/20 1634  SpO2 92 % 05/23/20 1634  Vitals shown include unvalidated device data.  Last Pain:  Vitals:   05/23/20 1620  TempSrc:   PainSc: Asleep         Complications: No complications documented.

## 2020-05-23 NOTE — Anesthesia Postprocedure Evaluation (Signed)
Anesthesia Post Note  Patient: Dennis Preston  Procedure(s) Performed: ENDARTERECTOMY FEMORAL BILATERAL SUPERFICIAL FEMORAL ARTERY STENTS  (Bilateral Groin) INSERTION OF ILIAC STENT (Left Groin)  Patient location during evaluation: PACU Anesthesia Type: General Level of consciousness: awake and alert Pain management: pain level controlled Vital Signs Assessment: post-procedure vital signs reviewed and stable Respiratory status: spontaneous breathing and respiratory function stable Cardiovascular status: stable Anesthetic complications: no   No complications documented.   Last Vitals:  Vitals:   05/23/20 1655 05/23/20 1700  BP:  (!) 76/60  Pulse: 84 80  Resp: 20 (!) 22  Temp:  36.5 C  SpO2: 93% 93%    Last Pain:  Vitals:   05/23/20 1650  TempSrc:   PainSc: 0-No pain                 Random Dobrowski K

## 2020-05-23 NOTE — Op Note (Signed)
OPERATIVE NOTE   PROCEDURE: 1. Left common femoral, profunda femoris, and superficial femoral artery endarterectomies 2.   Right common femoral, profunda femoris, and superficial femoral artery endarterectomies 3.   Left lower extremity angiogram including left iliofemoral angiogram 4.   Stent placement to the left external iliac artery with 8 mm diameter by 6 cm length and an 8 mm diameter by 4 cm length life star stent 5.   Angioplasty of the left SFA and popliteal artery with 5 mm diameter angioplasty balloon 6.   Stent placement x2 to the left SFA and popliteal artery with 6 mm diameter by 25 cm length Viabahn stent and a 7 mm diameter by 15 cm length Viabahn stent 7.   Right lower extremity angiogram 8.   Angioplasty of the right SFA and above-knee popliteal artery with 5 mm diameter angioplasty balloon 9.   Stent placement x2 to the right SFA and popliteal artery with 6 mm diameter by 25 cm length Viabahn stent and 6 mm diameter by 10 cm length Viabahn stent   PRE-OPERATIVE DIAGNOSIS: 1.Atherosclerotic occlusive disease bilateral lower extremities with rest pain   POST-OPERATIVE DIAGNOSIS: Same  SURGEON: Leotis Pain, MD  CO-surgeon: Hortencia Pilar, MD  ANESTHESIA: general  ESTIMATED BLOOD LOSS: 300 cc  FINDING(S): 1. significant plaque in bilateral common femoral, profunda femoris, and superficial femoral arteries 2.  Moderate to severe stenosis in the left external iliac artery in the 70% range 3.  Long segment SFA occlusions bilaterally  SPECIMEN(S): Bilateral common femoral, profunda femoris, and superficial femoral artery plaque.  INDICATIONS:  Patient presents with rest pain in the lower extremities and severe atherosclerotic occlusive disease.  Bilateral femoral endarterectomies are planned to try to improve perfusion.  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   There is also left iliac disease that needs addressed.  The risks and benefits as well as alternative therapies including intervention were reviewed in detail all questions were answered the patient agrees to proceed with surgery.  DESCRIPTION: After obtaining full informed written consent, the patient was brought back to the operating room and placed supine upon the operating table. The patient received IV antibiotics prior to induction. After obtaining adequate anesthesia, the patient was prepped and draped in the standard fashion appropriate time out is called.   With myself working on the right and Dr. Delana Meyer working on the left we began by dissecting out the femoral arteries on each side. Vertical incisions were created overlying both femoral arteries. The common femoral artery proximally, and superficial femoral artery, and primary profunda femoris artery branches were encircled with vessel loops and prepared for control. Both femoral arteries were found to have significant plaque from the common femoral artery into the profunda and superficial femoral arteries.   6000 units of heparin was given and allowed circulate for 5 minutes.   Attention is then turned to the right femoral artery. An arteriotomy is made with 11 blade and extended with Potts scissors in the common femoral artery and carried down onto the first 2-3 cm of the professional femoral artery. An endarterectomy was then performed. The [facility] was used to create a plane. The proximal endpoint was cut flush with tenotomy scissors. This was in the proximal common femoral artery. An eversion endarterectomy was then performed for the first 2-3 cm of the profunda femoris artery for a moderate amount of calcific plaque.  This was created with the Soil scientist  and gentle traction and a good endpoint was created. Good backbleeding was then seen. The distal endpoint of the superficial femoral endarterectomy was created with  gentle traction and the distal endpoint was fairly clean but there remained a long segment occlusion below this. The Cormatrix patcth is then selected and prepared for a patch angioplasty.  It is cut and beveled and started at the proximal endpoint with a 6-0 Prolene suture.  Approximately one half of the suture line is run medially and laterally and the distal end point was cut and bevelled to match the arteriotomy.  A second 6-0 Prolene was started at the distal end point and run to the mid portion to complete the arteriotomy.  The vessel was flushed prior to release of control and completion of the anastomosis.  At this point, flow was established first to the profunda femoris artery and then to the superficial femoral artery.  We would return to the side with needle access to the patch after addressing the left leg.  The left femoral artery is then addressed. Arteriotomy is made in the common femoral artery and extended down into the proximal superficial femoral artery about 3 cm. Similarly, an endarterectomy was performed with the [facility]. The proximal endpoint was created with gentle traction in the proximal common femoral artery.  This was fairly clean.  The left profunda femoris artery was addressed and treated with an eversion endarterectomy and this was performed with a hemostat and gentle traction after freeing it about 2 cm down with the Soil scientist.  A good distal endpoint was seen.  The arteriotomy was carried down onto the superficial femoral artery and the endarterectomy was continued to this point. The distal endpoint was created with gentle traction but there remained occlusion below this. The Cormatrix extracellular patch was then brought onto the field.  It is cut and beveled and started at the proximal endpoint with a 6-0 Prolene suture.  Approximately one half of the suture line is run medially and laterally and the distal end point was cut and bevelled to match the arteriotomy.   A second 6-0 Prolene was started at the distal end point and run to the mid portion to complete the arteriotomy leaving a short gap to place an 8 French sheath to perform the endovascular portion of the procedure.  On the left, Dr. Delana Meyer started by addressing the SFA.  He was able to cross the occlusion without difficulty with a Kumpe catheter and advantage wire and confirm intraluminal flow in the popliteal artery.  The distal runoff was good.  An 018 wire was then placed.  2 inflations with a 5 mm diameter by 30 cm length angioplasty balloon up to 10 to 12 atm from the popliteal artery in the midsegment up to the origin of the SFA.  Following angioplasty, there remained multiple areas of greater than 50% residual stenosis throughout the course.  A 6 mm diameter by 25 cm length Viabahn stent was started just above the knee right up into the proximal SFA.  A 7 mm diameter by 15 cm length Viabahn stent was then taken to the most proximal SFA at the edge of our patch.  This was postdilated with a 6 mm balloon with excellent angiographic completion result and less than 10% residual stenosis. The sheath was then flipped in a retrograde fashion to address the iliac artery.  Imaging confirmed a moderate stenosis in the 65 to 70% range in the left external iliac artery.  This  was then treated with an 8 mm diameter by 6 cm length life star stent and then an 8 mm diameter by 4 cm length life star stent over the advantage wire.  The started just below the origin of the hypogastric artery.  This was postdilated with a 7 mm diameter Lutonix drug-coated balloon with excellent angiographic completion result with about a 10% residual stenosis.  The sheath was then removed.   Flushing maneuvers were performed and flow was reestablished to the femoral vessels. Excellent pulses noted in the left superficial femoral and profunda femoris artery below the femoral anastomosis. We then turned our attention back to the right leg.  I  stuck the patch for the arteriotomy in the midsegment and placed a 6 French sheath over a J-wire.  Using the Kumpe catheter and the advantage wire was able to easily cross the SFA occlusion and confirm intraluminal flow in the mid popliteal artery.  I then placed a V 18 wire.  2 inflations with a 5 mm diameter by 30 cm length angioplasty balloon were done up to about 10 atm with each inflation.  Completion imaging showed significant residual stenosis in the proximal segment, the mid segment, and at Hunter's canal.  A 6 mm diameter by 25 cm length Viabahn stent was then deployed from the proximal popliteal artery up to the proximal SFA.  A 6 mm diameter by 10 cm length Viabahn stent was then deployed up to the distal edge of arteriotomy in the most proximal SFA.  These were postdilated with 6 mm balloons with excellent angiographic completion result and less than 10% residual stenosis.  The sheath was then removed and the vessel flushed and then the hole in the patch closed with a pursestring 6-0 Prolene suture. Surgicel and Evicel topical hemostatic agents were placed in the femoral incisions and hemostasis was complete. The femoral incisions were then closed in a layered fashion with 2 layers of 2-0 Vicryl, 2 layers of 3-0 Vicryl, and 5-0 Monocryl for the skin closure. Dermabond and sterile dressing were then placed over all incisions.  The patient was then awakened from anesthesia and taken to the recovery room in stable condition having tolerated the procedure well.  COMPLICATIONS: None  CONDITION: Stable     Leotis Pain 05/23/2020 3:53 PM  This note was created with Dragon Medical transcription system. Any errors in dictation are purely unintentional.

## 2020-05-23 NOTE — Op Note (Signed)
OPERATIVE NOTE   PROCEDURE: 1. Bilateral common femoral, superficial femoral and profunda femoris endarterectomy with Cormatrix patch angioplasty. 2. Left lower extremity angiography both antegrade and retrograde left common femoral approach. 3. Open transluminal angioplasty and stent placement left SFA and popliteal artery postdilated to 6 mm. 4. Open transluminal angioplasty and stent placement left external iliac artery to 7 mm in diameter. 5. Right lower extremity angiography antegrade from the right common femoral approach. 6. Open transluminal angioplasty and stent placement right SFA and popliteal  PRE-OPERATIVE DIAGNOSIS: Atherosclerotic occlusive disease bilateral lower extremities with lifestyle limiting claudication and rest pain symptoms; hypertension;   POST-OPERATIVE DIAGNOSIS: Same  CO-SURGEON: Dennis Cabal, MD and Dennis Preston, M.D.  ASSISTANT(S): None  ANESTHESIA: general  ESTIMATED BLOOD LOSS: 300 cc  FINDING(S): 1. Profound calcific plaque noted bilaterally extending past the initial bifurcation of the profunda femoris arteries as well as down the extensive length of the SFA  SPECIMEN(S):  Calcific plaque from the common femoral, superficial femoral and the profunda femoris arteries bilaterally  INDICATIONS:   Dennis Preston 69 y.o. y.o.male who presents with complaints of lifestyle limiting claudication and pain continuously in the feet bilaterally. The patient has documented severe atherosclerotic occlusive disease and has undergone multiple minimally invasive treatments in the past. However, at this point his primary area of stricture stenosis resides in the common femoral and origins of the superficial femoral and profunda femoris extending into these arteries and therefore this is not amenable to intervention and he is now undergoing open endarterectomy. The risks and benefits of been reviewed with the patient, all questions have answered; alternative  therapies have been reviewed as well and the patient has agreed to proceed with surgical open repair.  DESCRIPTION: After obtaining full informed written consent, the patient was brought back to the operating room and placed supine upon the operating table.  The patient received IV antibiotics prior to induction.  After obtaining adequate anesthesia, the patient was prepped and draped in the standard fashion for: bilateral femoral exposure.  Co-surgeons are required because this is a bilateral procedure with work being performed simultaneously from both the right femoral and left femoral approach.  This also expedite the procedure making a shorter operative time reducing complications and improving patient safety.  Attention was turned to the bilateral groin with Dennis Preston working on the right and myself working on the left of the patient.  Vertical  incisions were made over the common femoral artery and dissected down to the common femoral artery with electrocautery.  I dissected out the common femoral artery from the distal external iliac artery (identified by the superficial circumflex vessels) down to the femoral bifurcation.  On initial inspection, the common femoral artery was: Densely calcified and there was no palpable pulse noted bilaterally.    Subsequently the dissection was continued to include all circumflex branches and the profunda femoral artery and superficial femoral artery. The superficial femoral artery was dissected circumferentially for a distance of approximately 3-4 cm and the profunda femoris was dissected circumferentially out to the fourth order branches individual vessel loops were placed around each branch. Both of the groins were treated simultaneously as described above. Control of all branches was obtained with vessel loops.  A softer area in the distal external iliac artery amendable to clamping was identified.    The patient was given 5000 units of Heparin intravenously, which  was a therapeutic bolus.   After waiting 3 minutes, the distal external iliac  artery was clamped and placed all circumflex branches, and the profunda and superficial femoral arteries under tension.  Arteriotomy was made in the common femoral artery with a 11-blade and extended it with a Potts scissor proximally and distally extending the distal end down the SFA for approximately 3 cm.   Endarterectomy was then performed under direct visualization using a freer elevator and a right angle from the mid common femoral extending up both proximally and distally. Proximally the endarterectomy was brought up to the level of the clamp where a clean edge was obtained. Distally the endarterectomy was carried down to a soft spot in the SFA where a feathered edge would was obtained.    The profunda femoris was treated with an eversion technique extending endarterectomy approximately 2 cm distally again obtaining a featheredge on both the right and left sides.   Having completed the endarterectomies bilaterally we addressed the right side first.  At this point, a core matrix patch was trimmed to match the geometry of the right arteriotomy.  The patch was sewn to the artery with 2 running stitches of 6-0 Prolene, running from each end.  Prior to completing the patch angioplasty, the profunda femoral artery was flushed as was the superficial femoral artery. The system was then forward flushed. The endarterectomy site was then irrigated copiously with heparinized saline. The patch angioplasty was completed in the usual fashion.  Flow was then reestablished first to the profunda femoris and then the superficial femoral artery. Any gaps or bleeding sites in the suture line were easily controlled with a 6-0 Prolene suture.   Subsequently, the left side was addressed and again a core matrix patch was delivered onto the field rehydrated and then trimmed to match the geometry of the left arteriotomy.  The patch was sewn to the  arteriotomy with 2 running sutures of 6-0 Prolene running from proximal distal and then from the distal apex proximally.  The medial suture line was completed in its entirety whereas the lateral there was a small gap left in the midportion to allow for placement of a sheath.  At this point we began the interventional portion of the case.  With myself working on the left and Dennis Preston assisting and then Dennis Preston working on the right and myself assisting.  The left patch Was then used as an access point and an 8 [place] sheath was inserted.  Using a Kumpe catheter and a Glidewire the occluded SFA was crossed and hand-injection of contrast in the distal popliteal demonstrated three-vessel runoff.  Having successfully cross the lesion I then used a 4 mm balloon and dilated the entire length of the SFA and the proximal and midportion of the popliteal.  Follow-up imaging demonstrated greater than 75% residual stenosis at multiple locations and I elected to place stents.  A 6 mm x 25 cm length Viabahn stent was then deployed from the mid popliteal proximally this was then followed by a 7 mm x 15 cm length Viabahn stent.  The stents were then postdilated to 6 mm with a Dorado balloon multiple inflations were performed from 14 to 20 atm.  The distalmost aspect of the stent was dilated with a 5 mm Lutonix drug-eluting balloon inflated to 6 atm.  Otherwise as noted the 6 mm Dorado was used to expand the stents.  Follow-up imaging now demonstrated less than 10% residual stenosis with preservation of the three-vessel runoff.  Excellent backbleeding is now noted when the sheath is pulled.  The SFA  and tibial vessels were then irrigated copiously with heparinized to saline and the proximal SFA occluded with a Silastic vessel loop.  The 8 French sheath was then reversed in direction and the wire advanced into the aorta.  Hand-injection contrast through the 8 [place] sheath demonstrated greater than 70% stenosis diffusely throughout  the left external iliac artery.  2 life star stents were then deployed an 8 mm x 60 and an 8 mm x 40.  These were postdilated with 7 mm Lutonix drug-eluting balloons inflated to 8 to 10 atm for approximately 1 minute.  Follow-up imaging demonstrated less than 10% residual stenosis.  The sheath was subsequently removed and the iliac system forward flushed.  The arteriotomy was then irrigated copiously with heparinized saline.  The left lateral suture line was then completed flushing maneuvers were performed any gaps or leaking spots were controlled with a 6-0 Prolene interrupted suture.  A moist lap pad was then applied to the wound and attention was turned to the right SFA.  Dennis Preston then accessed the center of the patch with a Seldinger needle advanced a wire and subsequently placed a 6 French sheath.  Using a combination of the advantage wire and a Kumpe catheter he crossed the occluded SFA hand-injection of contrast in the distal popliteal demonstrated three-vessel runoff essentially his anatomy on the right side of his almost identical to the left.  Again a 4 mm balloon was used to treat the SFA but the resulting high-grade residual stenosis throughout the entire length the SFA and popliteal necessitated stenting.  A 6 mm diameter by 25 cm length Viabahn followed by 6 mm diameter by 10 cm length Viabahn was then deployed these were postdilated to 6 mm with a Dorado balloon that had been used previously.  Follow-up imaging now demonstrated less than 10% residual stenosis and preservation of the three-vessel runoff to the foot.  Pursestring suture was then placed around the 6 French sheath and the sheath was removed and the pursestring secured.  Both right and left groins were then irrigated copiously with sterile saline and subsequently Evicel and Surgicel were placed in the wound. The incision was repaired with a double layer of 2-0 Vicryl, a double layer of 3-0 Vicryl, and a layer of 4-0 Monocryl in a  subcuticular fashion.  The skin was cleaned, dried, and reinforced with Dermabond.  COMPLICATIONS: None  CONDITION: Dennis Preston, M.D. [facility] Office: [phone]  05/23/2020, 4:04 PM

## 2020-05-24 ENCOUNTER — Encounter: Payer: Self-pay | Admitting: Vascular Surgery

## 2020-05-24 DIAGNOSIS — I70229 Atherosclerosis of native arteries of extremities with rest pain, unspecified extremity: Principal | ICD-10-CM

## 2020-05-24 LAB — BASIC METABOLIC PANEL
Anion gap: 5 (ref 5–15)
BUN: 13 mg/dL (ref 8–23)
CO2: 28 mmol/L (ref 22–32)
Calcium: 8.1 mg/dL — ABNORMAL LOW (ref 8.9–10.3)
Chloride: 107 mmol/L (ref 98–111)
Creatinine, Ser: 0.7 mg/dL (ref 0.61–1.24)
GFR calc Af Amer: >60 mL/min (ref >60)
GFR calc non Af Amer: >60 mL/min (ref >60)
Glucose, Bld: 140 mg/dL — ABNORMAL HIGH (ref 70–99)
Potassium: 4.3 mmol/L (ref 3.5–5.1)
Sodium: 140 mmol/L (ref 135–145)

## 2020-05-24 LAB — CBC
HCT: 36.8 % — ABNORMAL LOW (ref 39.0–52.0)
Hemoglobin: 12.2 g/dL — ABNORMAL LOW (ref 13.0–17.0)
MCH: 31.4 pg (ref 26.0–34.0)
MCHC: 33.2 g/dL (ref 30.0–36.0)
MCV: 94.8 fL (ref 80.0–100.0)
Platelets: 169 10*3/uL (ref 150–400)
RBC: 3.88 MIL/uL — ABNORMAL LOW (ref 4.22–5.81)
RDW: 14.8 % (ref 11.5–15.5)
WBC: 16.5 10*3/uL — ABNORMAL HIGH (ref 4.0–10.5)
nRBC: 0 % (ref 0.0–0.2)

## 2020-05-24 MED ORDER — SODIUM CHLORIDE 0.9 % IV SOLN
500.0000 mL | Freq: Once | INTRAVENOUS | Status: AC | PRN
  Administered 2020-05-24: 500 mL via INTRAVENOUS

## 2020-05-24 MED ORDER — OXYCODONE-ACETAMINOPHEN 5-325 MG PO TABS
ORAL_TABLET | ORAL | Status: AC
  Administered 2020-05-24: 1 via ORAL
  Filled 2020-05-24: qty 1

## 2020-05-24 MED ORDER — ASPIRIN 81 MG PO CHEW
CHEWABLE_TABLET | ORAL | Status: AC
  Administered 2020-05-24: 81 mg via ORAL
  Filled 2020-05-24: qty 1

## 2020-05-24 MED ORDER — DOPAMINE-DEXTROSE 3.2-5 MG/ML-% IV SOLN
INTRAVENOUS | Status: AC
  Administered 2020-05-24: 3 ug/kg/min via INTRAVENOUS
  Filled 2020-05-24: qty 250

## 2020-05-24 MED ORDER — MORPHINE SULFATE (PF) 4 MG/ML IV SOLN: 2.0000 mg | INTRAVENOUS | Status: DC | PRN

## 2020-05-24 MED ORDER — OXYCODONE-ACETAMINOPHEN 5-325 MG PO TABS
ORAL_TABLET | ORAL | Status: AC
  Filled 2020-05-24: qty 2

## 2020-05-24 MED ORDER — CEFAZOLIN SODIUM-DEXTROSE 2-4 GM/100ML-% IV SOLN
INTRAVENOUS | Status: AC
  Administered 2020-05-24: 2 g via INTRAVENOUS
  Filled 2020-05-24: qty 100

## 2020-05-24 NOTE — Progress Notes (Signed)
°   05/24/20 0700  Clinical Encounter Type  Visited With Patient  Visit Type Initial  Referral From Chaplain  Consult/Referral To Chaplain  While rounding SDS, chaplain briefly spoke with patient. Patient said his blood pressure has been up, but he is feeling better, said he had surgery ard 11 last night. Chaplain told patient she will be praying for his pressure to regulate. As chaplain was leaving patient said glad to to meet you. 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 Chaplain said that is my favorite nephew's name and I won't forget you and chaplain repeated, "I won't forget you." Chaplain wished patient well.

## 2020-05-24 NOTE — Evaluation (Signed)
Physical Therapy Evaluation Patient Details Name: Dennis Preston MRN: [id number] DOB: 05/04/1951 Today's Date: 05/24/2020   History of Present Illness  Pt is a 69 yo male that underwent bilateral femoral endarterectomy and stenting. PMH of COPD, CAD, past MI, PVD, CHF, anxiety, depression.    Clinical Impression  Pt easily woken, oriented x4, endorsed minimal pain at rest but with mobility especially sitting reported significant bilateral groin pain. Pt stated at baseline he is independent (intermitently uses SPC for LE pain), drives, no recent falls.  RN at bedside throughout session for 2+ assist for safety and monitoring of BP. BP assessed in BUE, readings low but arterial line BP okay for mobility attempts per RN (ranging from 90s/40-60s). Supine to sit with CGA and use of bed rails. Once in sitting BP readings very similar, pt with poor tolerance to position due to pain. Pt was able to stand and take small shuffled steps to recliner at beside with 2+ handheld assist for safety, very little physical assist needed.  A line systolic BP did drop to 70s once pt was repositioned in sitting in recliner, and pt did endorse dizziness and appeared flushed. BP rebounded swiftly with repositioning in chair and rest.  Overall the patient demonstrated deficits (see "PT Problem List") that impede the patient's functional abilities, safety, and mobility and would benefit from skilled PT intervention. Recommendation is SNF pending pt progress due to limitations in functional mobility.      Follow Up Recommendations SNF    Equipment Recommendations  Other (comment) (TBD at next venue of care)    Recommendations for Other Services       Precautions / Restrictions Precautions Precautions: Fall Precaution Comments: watch BP Restrictions Weight Bearing Restrictions: No      Mobility  Bed Mobility Overal bed mobility: Needs Assistance Bed Mobility: Supine to Sit     Supine to sit: Min guard;HOB  elevated        Transfers Overall transfer level: Needs assistance Equipment used: 2 person hand held assist Transfers: Sit to/from Stand Sit to Stand: Min guard         General transfer comment: pt able to take several small steps to sit in recliner at bedside. A line systolic BP did drop to 70s once pt was repositioned in sitting, and pt did endorse dizziness and appeared flushed.  Ambulation/Gait             General Gait Details: deferred due to pt pain and low BP  Stairs            Wheelchair Mobility    Modified Rankin (Stroke Patients Only)       Balance Overall balance assessment: Needs assistance Sitting-balance support: Feet supported Sitting balance-Leahy Scale: Good       Standing balance-Leahy Scale: Fair Standing balance comment: more comfortable with UE support                             Pertinent Vitals/Pain Pain Assessment: Faces Faces Pain Scale: Hurts even more Pain Location: especially in sitting at surgical incision sites (bilateral groin) Pain Descriptors / Indicators: Aching;Burning;Throbbing;Tender Pain Intervention(s): Limited activity within patient's tolerance;Monitored during session;Premedicated before session;Repositioned    Home Living Family/patient expects to be discharged to:: Private residence Living Arrangements: Spouse/significant other Available Help at Discharge: Family Type of Home: House Home Access: Stairs to enter Entrance Stairs-Rails: Doctor, general practice of Steps: 6 Home Layout: One level Home Equipment: Gilmer Mor -  single point      Prior Function Level of Independence: Independent         Comments: pt drives, occasionally uses SPC for pain in his legs     Hand Dominance   Dominant Hand: Left    Extremity/Trunk Assessment   Upper Extremity Assessment Upper Extremity Assessment: Overall WFL for tasks assessed    Lower Extremity Assessment Lower Extremity  Assessment:  (limited due to pain, but pt able to mobilize to EOB without LE assist)    Cervical / Trunk Assessment Cervical / Trunk Assessment: Normal  Communication   Communication: No difficulties  Cognition Arousal/Alertness: Awake/alert Behavior During Therapy: WFL for tasks assessed/performed Overall Cognitive Status: Within Functional Limits for tasks assessed                                        General Comments      Exercises     Assessment/Plan    PT Assessment Patient needs continued PT services  PT Problem List Decreased strength;Decreased mobility;Decreased range of motion;Decreased activity tolerance;Decreased balance;Pain;Decreased knowledge of use of DME       PT Treatment Interventions DME instruction;Therapeutic exercise;Gait training;Balance training;Stair training;Neuromuscular re-education;Functional mobility training;Therapeutic activities;Patient/family education    PT Goals (Current goals can be found in the Care Plan section)  Acute Rehab PT Goals Patient Stated Goal: to return to PLOF PT Goal Formulation: With patient Time For Goal Achievement: 06/07/20 Potential to Achieve Goals: Good    Frequency Min 2X/week   Barriers to discharge Inaccessible home environment      Co-evaluation               AM-PAC PT "6 Clicks" Mobility  Outcome Measure Help needed turning from your back to your side while in a flat bed without using bedrails?: A Little Help needed moving from lying on your back to sitting on the side of a flat bed without using bedrails?: A Little Help needed moving to and from a bed to a chair (including a wheelchair)?: A Lot Help needed standing up from a chair using your arms (e.g., wheelchair or bedside chair)?: A Little Help needed to walk in hospital room?: A Lot Help needed climbing 3-5 steps with a railing? : Total 6 Click Score: 14    End of Session Equipment Utilized During Treatment: Gait  belt Activity Tolerance: Patient tolerated treatment well;Treatment limited secondary to medical complications (Comment);Other (comment) (limited by low BP and surgical pain) Patient left: in chair;with call bell/phone within reach;with nursing/sitter in room Nurse Communication: Mobility status PT Visit Diagnosis: Other abnormalities of gait and mobility (R26.89);Muscle weakness (generalized) (M62.81);Pain;Unsteadiness on feet (R26.81) Pain - Right/Left: Right (and L) Pain - part of body: Hip    Time: 7482-7078 PT Time Calculation (min) (ACUTE ONLY): 17 min   Charges:   PT Evaluation $PT Eval Low Complexity: 1 Low PT Treatments $Therapeutic Activity: 8-22 mins       Olga Coaster PT, DPT 2:45 PM,05/24/20

## 2020-05-24 NOTE — Progress Notes (Signed)
[facility] Daily Progress Note   Subjective: 1. Left common femoral, profunda femoris, and superficial femoral artery endarterectomies 2.   Right common femoral, profunda femoris, and superficial femoral artery endarterectomies 3.   Left lower extremity angiogram including left iliofemoral angiogram 4.   Stent placement to the left external iliac artery with 8 mm diameter by 6 cm length and an 8 mm diameter by 4 cm length life star stent 5.   Angioplasty of the left SFA and popliteal artery with 5 mm diameter angioplasty balloon 6.   Stent placement x2 to the left SFA and popliteal artery with 6 mm diameter by 25 cm length Viabahn stent and a 7 mm diameter by 15 cm length Viabahn stent 7.   Right lower extremity angiogram 8.   Angioplasty of the right SFA and above-knee popliteal artery with 5 mm diameter angioplasty balloon 9.   Stent placement x2 to the right SFA and popliteal artery with 6 mm diameter by 25 cm length Viabahn stent and 6 mm diameter by 10 cm length Viabahn stent  Patient without complaint this AM.  Dennis Preston at bedside.  No issues overnight with the exception of dopamine drip initiated for hypotension.  Objective: Vitals:   05/24/20 0653 05/24/20 0754 05/24/20 0845 05/24/20 1002  BP: (!) 100/54 (!) 104/52 (!) 97/48 (!) 112/42  Pulse: 70 67 63 88  Resp: 20 (!) 22 20 19   Temp:   98.5 F (36.9 C)   TempSrc:      SpO2: 92% 94% 96% 95%  Weight:      Height:        Intake/Output Summary (Last 24 hours) at 05/24/2020 1054 Last data filed at 05/24/2020 1015 Gross per 24 hour  Intake 3549.99 ml  Output 2950 ml  Net 599.99 ml   Physical Exam: A&Ox3, NAD CV: RRR Pulmonary: CTA Bilaterally Abdomen: Soft, Non-tender, Non-distended Right Groin:  OR dressing intact (honeycomb), clean and dry.  No swelling or drainage.  Some surrounding ecchymosis noted. Left Groin:  OR dressing intact (honeycomb), clean and dry.  No swelling or drainage.  Some  surrounding ecchymosis noted. Vascular:  Right Lower Extremity: Thigh soft.  Calf soft.  Extremities warm distally to toes.  Good capillary refill.  Minimal edema.  Motor/sensory is intact.  Left Lower Extremity:  Thigh soft.  Calf soft.  Extremities warm distally to toes.  Good capillary refill.  Minimal edema.  Motor/sensory is intact.  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 4) once weaned from pressors okay to transfer to the surgical floor 5) parameters for SBP 110-140, hold antihypertension medication  until wean from pressors  Discussed with Dr. Charlie [facility] PA-C 05/24/2020 10:54 AM

## 2020-05-24 NOTE — Progress Notes (Signed)
Nursing communication: Spoke to K. Stegmayer PA when rounding to try and ween patient off dopamine drip and ambulate patient as much as possible. Dopamine drip stopped at 1145 with SBP at or above 100. Patient up to chair with moderate pain.

## 2020-05-24 NOTE — Progress Notes (Signed)
Pt's systolic BP remains less than 209 after second 500 ml bolus NS. Dopamine started as ordered by MD.

## 2020-05-25 ENCOUNTER — Encounter: Payer: Self-pay | Admitting: Vascular Surgery

## 2020-05-25 LAB — BASIC METABOLIC PANEL
Anion gap: 6 (ref 5–15)
BUN: 15 mg/dL (ref 8–23)
CO2: 28 mmol/L (ref 22–32)
Calcium: 8 mg/dL — ABNORMAL LOW (ref 8.9–10.3)
Chloride: 102 mmol/L (ref 98–111)
Creatinine, Ser: 0.81 mg/dL (ref 0.61–1.24)
GFR calc Af Amer: >60 mL/min (ref >60)
GFR calc non Af Amer: >60 mL/min (ref >60)
Glucose, Bld: 102 mg/dL — ABNORMAL HIGH (ref 70–99)
Potassium: 4.3 mmol/L (ref 3.5–5.1)
Sodium: 136 mmol/L (ref 135–145)

## 2020-05-25 LAB — CBC
HCT: 34.4 % — ABNORMAL LOW (ref 39.0–52.0)
Hemoglobin: 11.3 g/dL — ABNORMAL LOW (ref 13.0–17.0)
MCH: 30.9 pg (ref 26.0–34.0)
MCHC: 32.8 g/dL (ref 30.0–36.0)
MCV: 94 fL (ref 80.0–100.0)
Platelets: 150 10*3/uL (ref 150–400)
RBC: 3.66 MIL/uL — ABNORMAL LOW (ref 4.22–5.81)
RDW: 15 % (ref 11.5–15.5)
WBC: 13.5 10*3/uL — ABNORMAL HIGH (ref 4.0–10.5)
nRBC: 0 % (ref 0.0–0.2)

## 2020-05-25 LAB — MAGNESIUM: Magnesium: 1.8 mg/dL (ref 1.7–2.4)

## 2020-05-25 LAB — SURGICAL PATHOLOGY

## 2020-05-25 MED ORDER — SACUBITRIL-VALSARTAN 24-26 MG PO TABS
1.0000 | ORAL_TABLET | Freq: Two times a day (BID) | ORAL | Status: DC
  Filled 2020-05-25: qty 1

## 2020-05-25 MED ORDER — KETOROLAC TROMETHAMINE 15 MG/ML IJ SOLN
15.0000 mg | Freq: Four times a day (QID) | INTRAMUSCULAR | Status: AC
  Administered 2020-05-25 – 2020-05-26 (×3): 15 mg via INTRAVENOUS
  Filled 2020-05-25 (×4): qty 1

## 2020-05-25 MED ORDER — ASPIRIN EC 81 MG PO TBEC
81.0000 mg | DELAYED_RELEASE_TABLET | Freq: Every day | ORAL | Status: DC
  Administered 2020-05-26 – 2020-05-28 (×3): 81 mg via ORAL
  Filled 2020-05-25 (×4): qty 1

## 2020-05-25 NOTE — Interval H&P Note (Signed)
History and Physical Interval Note:  05/25/2020 4:03 PM  Dennis Preston  has presented today for surgery, with the diagnosis of arterosclerosis obliterans.  The various methods of treatment have been discussed with the patient and family. After consideration of risks, benefits and other options for treatment, the patient has consented to  Procedure(s): ENDARTERECTOMY FEMORAL BILATERAL SUPERFICIAL FEMORAL ARTERY STENTS  (Bilateral) INSERTION OF ILIAC STENT (Left) as a surgical intervention.  The patient's history has been reviewed, patient examined, no change in status, stable for surgery.  I have reviewed the patient's chart and labs.  Questions were answered to the patient's satisfaction.     [name]

## 2020-05-25 NOTE — TOC Progression Note (Signed)
Transition of Care [facility]) - Progression Note    Patient Details  Name: Dennis Preston MRN: [id number] Date of Birth: 1951/03/05  Transition [facility]) CM/SW Contact  Barrie Dunker, RN Phone Number: 05/25/2020, 2:54 PM  Clinical Narrative:    Received a call from Verlon Au at [facility], they are accepting the patient, Verlon Au will call Jeronimo Norma the daughter        Expected Discharge Plan and Services                                                 Social Determinants of Health (SDOH) Interventions    Readmission Risk Interventions No flowsheet data found.

## 2020-05-25 NOTE — Discharge Instructions (Signed)
Vascular Surgery Discharge Instructions:  1) you may shower.  Please gently wash your incisions with soap and water and gently pat dry. 2) please do not engage in strenuous activity or lifting heavier than 10 pounds for at least 2 weeks. 3) no driving until you are cleared during your first postoperative visit. 4) no driving or drinking while taking pain medication.

## 2020-05-25 NOTE — TOC Progression Note (Signed)
Transition of Care [facility]) - Progression Note    Patient Details  Name: Dennis Preston MRN: [id number] Date of Birth: 06/05/1951  Transition of Care [facility]) CM/SW [place], RN Phone Number: 05/25/2020, 2:28 PM  Clinical Narrative:   Met with the patient again at his request, He called his daughter Dennis Preston and stated that they want to go to [facility] and not [facility], I called Magda Paganini with [facility] and she will take a look at the patient to see if they can make an offer and let me know         Expected Discharge Plan and Services                                                 Social Determinants of Health (SDOH) Interventions    Readmission Risk Interventions No flowsheet data found.

## 2020-05-25 NOTE — Evaluation (Signed)
Occupational Therapy Evaluation Patient Details Name: Dennis Preston MRN: [id number] DOB: 01-01-1951 Today's Date: 05/25/2020    History of Present Illness Pt is a 69 yo male that underwent bilateral femoral endarterectomy and stenting. PMH of COPD, CAD, past MI, PVD, CHF, anxiety, depression.   Clinical Impression   Pt was seen for OT evaluation this date. Prior to hospital admission, pt was Indep with self care ADLs/ADL mobility (occasional MOD I with SPC). Pt lives with spouse in [facility] with 6 STE with bilateral railing. Currently pt demonstrates impairments as described below (See OT problem list) which functionally limit his ability to perform ADL/self-care tasks. Pt currently requires setup/supv for bed level UB ADLs and cannot tolerate further assessment of LB ADLs, bed mobility or transfers on OT evaluation at this time.  Pt would benefit from skilled OT to address noted impairments and functional limitations (see below for any additional details) in order to maximize safety and independence while minimizing falls risk and caregiver burden. Upon hospital discharge, recommend STR to maximize pt safety and return to PLOF. Of note: Pt somewhat tremulous when OT presents. Has breathing treatment apparatus sitting on his lap that he had apparently taken off and had not replaced nasal cannula. Pt's O2 slightly low when OT presents (85-86%) but quikly elevates to 99-100% with 2Lnc re-applied and pt able to calm down at that point. RN notified.     Follow Up Recommendations  SNF    Equipment Recommendations  Other (comment) (defer to next level of care)    Recommendations for Other Services       Precautions / Restrictions Precautions Precautions: Fall Precaution Comments: watch vitals-spO2 Restrictions Weight Bearing Restrictions: No      Mobility Bed Mobility   General bed mobility comments: pt declines to get OOB with OT  Transfers         General transfer comment: Pt  declines to get OOB with OT at this time, finished breathing treatment just prior, pt sat breathign apparatus down w/o re-applying nasal cannula and spO2 somewhat low (85-96%) OT corrects and notifies RN. Pt's O2 quickly returns to 100% (w/in 30 seconds). But pt continues to decline OOB.    Balance  Sitting balance - Comments: NT     Standing balance comment: NT                           ADL either performed or assessed with clinical judgement   ADL Overall ADL's : Needs assistance/impaired                                       General ADL Comments: Able to perform supported sitting/bed level long sitting UB ADLs with setup/supv. Pt with low tolerance, declines to get EOB with OT at this time, just finished breathing treatment and somewhat tremulous.     Vision Patient Visual Report: No change from baseline       Perception     Praxis      Pertinent Vitals/Pain Pain Assessment: Faces Pain Score: 7  Faces Pain Scale: Hurts even more Pain Location: b/l hips, worse with mobilization Pain Descriptors / Indicators: Tender;Burning Pain Intervention(s): Limited activity within patient's tolerance;Monitored during session     Hand Dominance Left   Extremity/Trunk Assessment Upper Extremity Assessment Upper Extremity Assessment: Overall WFL for tasks assessed (grossly 4+/5)   Lower Extremity Assessment  Lower Extremity Assessment: Defer to PT evaluation;Generalized weakness   Cervical / Trunk Assessment Cervical / Trunk Assessment: Normal   Communication Communication Communication: No difficulties   Cognition Arousal/Alertness: Awake/alert Behavior During Therapy: WFL for tasks assessed/performed;Anxious Overall Cognitive Status: Within Functional Limits for tasks assessed                                     General Comments  Pt somewhat tremulous when OT presnts. Has breathing treatment apparatus sitting on his lap that he had  apparently taken off and had not replaced nasal cannula. Pt's O2 slightly low when OT presents (85-86%) but quikly elevates to 99-100% with 2Lnc re-applied and pt able to calm down at that point.    Exercises Other Exercises Other Exercises: OT facilitets ed re: role of OT, importance of OOB activity including preventing PNA, skin breakdown and muscle atrophy. Pt with moderate understanding.   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        OT Problem List: Decreased strength;Decreased activity tolerance;Decreased knowledge of use of DME or AE;Cardiopulmonary status limiting activity;Impaired balance (sitting and/or standing)      OT Treatment/Interventions: Self-care/ADL training;Therapeutic exercise;Energy conservation;DME and/or AE instruction;Therapeutic activities;Balance training;Patient/family education    OT Goals(Current goals can be found in the care plan section) Acute Rehab OT Goals Patient Stated Goal: to return to PLOF OT Goal Formulation: With patient Time For Goal Achievement: 06/08/20 Potential to Achieve Goals: Good ADL Goals Pt Will Perform Upper Body Dressing: with set-up;sitting Pt Will Perform Lower Body Dressing: with min assist;sit to/from stand (with AE PRN for EC) Pt Will Transfer to Toilet: with min assist;stand pivot transfer;bedside commode Pt Will Perform Toileting -  Clothing Manipulation and hygiene: with min assist;sit to/from stand Pt/caregiver will Perform Home Exercise Program: Increased strength;Both right and left upper extremity;With Supervision Additional ADL Goal #1: Pt will verbalize how to implement EC strategies into 3 aspects of ADL routine with 0% verbal cues.  OT Frequency: Min 1X/week   Barriers to D/C:            Co-evaluation              AM-PAC OT "6 Clicks" Daily Activity     Outcome Measure Help from another person eating meals?: None Help from another person taking care of personal grooming?: A Little Help from another person toileting, which includes using toliet, bedpan, or urinal?: A Lot Help from another person bathing (including washing, rinsing, drying)?: A Lot Help from another person to put on and taking off regular upper body clothing?: A Little Help from another person to put on and taking off regular lower body clothing?: A Lot 6 Click Score: 16   End of Session Nurse Communication: Other (comment) (O2  status)  Activity Tolerance: Other (comment);Patient limited by fatigue (somewhat self limiting) Patient left: in bed;with call bell/phone within reach;with bed alarm set  OT Visit Diagnosis: Muscle weakness (generalized) (M62.81)                Time: 6754-4920 OT Time Calculation (min): 18 min Charges:  OT General Charges $OT Visit: 1 Visit OT Evaluation $OT Eval Moderate Complexity: 1 Mod OT Treatments $Self Care/Home Management : 8-22 mins  Rejeana Brock, MS, OTR/L ascom [phone] 05/25/20, 3:40 PM

## 2020-05-25 NOTE — [place] FL2 (Signed)
[place] MEDICAID FL2 LEVEL OF CARE SCREENING TOOL     IDENTIFICATION  Patient Name: Dennis Preston Birthdate: November 22, 1950 Sex: male Admission Date (Current Location): 05/23/2020  [place] and [place] Number:  Chiropodist and Address:  [facility], [address] [place], [place] [address]      Provider Number: [id number]  Attending Physician Name and Address:  Renford Dills, MD  Relative Name and Phone Number:  Sydell Axon [phone]    Current Level of Care: Hospital Recommended Level of Care: Skilled Nursing Facility Prior Approval Number:    Date Approved/Denied:   PASRR Number: [id number]  Discharge Plan: SNF    Current Diagnoses: Patient Active Problem List   Diagnosis Date Noted  . Atherosclerosis of artery of extremity with rest pain (HCC) 05/23/2020  . Chronic venous insufficiency 03/01/2020  . COPD exacerbation (HCC) 02/13/2020  . Aortic atherosclerosis (HCC) 08/08/2019  . COPD (chronic obstructive pulmonary disease) with chronic bronchitis (HCC) 08/08/2019  . COPD with acute exacerbation (HCC) 06/13/2018  . Tobacco use 01/14/2017  . A-fib (HCC) 07/18/2015  . Coagulopathy (HCC) 07/17/2015  . Hypotension 07/14/2015  . Leukocytosis 07/14/2015  . PAD (peripheral artery disease) (HCC) 07/14/2015  . S/P coronary artery bypass graft x 4 07/14/2015  . Carotid atherosclerosis, bilateral 07/12/2015  . Transient visual loss of right eye 07/11/2015  . NSTEMI (non-ST elevated myocardial infarction) (HCC) 07/10/2015  . Amaurosis fugax 07/10/2015  . Coronary artery disease 07/10/2015  . Hypoxia 07/09/2015  . ANXIETY 02/24/2007  . ERECTILE DYSFUNCTION 02/24/2007  . LIVER FUNCTION TESTS, ABNORMAL 02/24/2007    Orientation RESPIRATION BLADDER Height & Weight     Self, Time, Situation, Place  Normal Continent Weight: 77.5 kg Height:  5\' 8"  (172.7 cm)  BEHAVIORAL SYMPTOMS/MOOD NEUROLOGICAL BOWEL NUTRITION STATUS       Continent Diet (regular)  AMBULATORY STATUS COMMUNICATION OF NEEDS Skin   Extensive Assist Verbally Surgical wounds                       Personal Care Assistance Level of Assistance  Dressing, Bathing Bathing Assistance: Limited assistance   Dressing Assistance: Limited assistance     Functional Limitations Info             SPECIAL CARE FACTORS FREQUENCY  PT (By licensed PT), OT (By licensed OT)     PT Frequency: 5 times per week OT Frequency: 3 times per week            Contractures Contractures Info: Not present    Additional Factors Info  Code Status, Allergies Code Status Info: full code Allergies Info: NKDA           Current Medications (05/25/2020):  This is the current hospital active medication list Current Facility-Administered Medications  Medication Dose Route Frequency Provider Last Rate Last Admin  . acetaminophen (TYLENOL) tablet 325-650 mg  325-650 mg Oral Q4H PRN Schnier, 05/27/2020, MD       Or  . acetaminophen (TYLENOL) suppository 325-650 mg  325-650 mg Rectal Q4H PRN Schnier, Latina Craver, MD      . albuterol (PROVENTIL) (2.5 MG/3ML) 0.083% nebulizer solution 2.5 mg  2.5 mg Inhalation Q6H PRN Schnier, Latina Craver, MD   2.5 mg at 05/24/20 2233  . alum & mag hydroxide-simeth (MAALOX/MYLANTA) 200-200-20 MG/5ML suspension 15-30 mL  15-30 mL Oral Q2H PRN Schnier, 01-20-1975, MD      . aspirin chewable tablet 81 mg  81 mg  Oral Daily Schnier, Latina Craver, MD   81 mg at 05/25/20 0857  . atorvastatin (LIPITOR) tablet 80 mg  80 mg Oral q1800 Schnier, Latina Craver, MD   80 mg at 05/24/20 2210  . clopidogrel (PLAVIX) tablet 75 mg  75 mg Oral QPM Schnier, Latina Craver, MD   75 mg at 05/24/20 1846  . docusate sodium (COLACE) capsule 100 mg  100 mg Oral Daily Schnier, Latina Craver, MD   100 mg at 05/25/20 0857  . famotidine (PEPCID) IVPB 20 mg premix  20 mg Intravenous Q12H Schnier, Latina Craver, MD 100 mL/hr at 05/25/20 0857 20 mg at 05/25/20 0857  . furosemide (LASIX)  tablet 40 mg  40 mg Oral Daily Schnier, Latina Craver, MD   40 mg at 05/25/20 0857  . guaiFENesin-dextromethorphan (ROBITUSSIN DM) 100-10 MG/5ML syrup 15 mL  15 mL Oral Q4H PRN Schnier, Latina Craver, MD   15 mL at 05/24/20 1714  . hydrALAZINE (APRESOLINE) injection 5 mg  5 mg Intravenous Q20 Min PRN Schnier, Latina Craver, MD      . labetalol (NORMODYNE) injection 10 mg  10 mg Intravenous Q10 min PRN Schnier, Latina Craver, MD      . magnesium sulfate IVPB 2 g 50 mL  2 g Intravenous Daily PRN Schnier, Latina Craver, MD      . metoprolol tartrate (LOPRESSOR) injection 2-5 mg  2-5 mg Intravenous Q2H PRN Schnier, Latina Craver, MD      . morphine 4 MG/ML injection 2 mg  2 mg Intravenous Q4H PRN Stegmayer, Kimberly A, PA-C      . nitroGLYCERIN 50 mg in dextrose 5 % 250 mL (0.2 mg/mL) infusion  5-250 mcg/min Intravenous Titrated Schnier, Latina Craver, MD      . ondansetron [facility]) injection 4 mg  4 mg Intravenous Q6H PRN Schnier, Latina Craver, MD      . oxyCODONE-acetaminophen (PERCOCET/ROXICET) 5-325 MG per tablet 1-2 tablet  1-2 tablet Oral Q4H PRN Schnier, Latina Craver, MD   2 tablet at 05/25/20 0519  . phenol (CHLORASEPTIC) mouth spray 1 spray  1 spray Mouth/Throat PRN Schnier, Latina Craver, MD      . potassium chloride SA (KLOR-CON) CR tablet 20-40 mEq  20-40 mEq Oral Daily PRN Schnier, Latina Craver, MD      . sacubitril-valsartan (ENTRESTO) 24-26 mg per tablet  1 tablet Oral BID Schnier, Latina Craver, MD   1 tablet at 05/23/20 2207  . senna-docusate (Senokot-S) tablet 1 tablet  1 tablet Oral QHS PRN Schnier, Latina Craver, MD      . sorbitol 70 % solution 30 mL  30 mL Oral Daily PRN Schnier, Latina Craver, MD      . umeclidinium-vilanterol (ANORO ELLIPTA) 62.5-25 MCG/INH 1 puff  1 puff Inhalation Daily Schnier, Latina Craver, MD   1 puff at 05/24/20 7342     Discharge Medications: Please see discharge summary for a list of discharge medications.  Relevant Imaging Results:  Relevant Lab Results:   Additional  Information [id number]  Barrie Dunker, RN

## 2020-05-25 NOTE — Progress Notes (Signed)
[facility] Daily Progress Note  Subjective: (05/23/20) 1. Left common femoral, profunda femoris, and superficial femoral artery endarterectomies 2. Right common femoral, profunda femoris, and superficial femoral artery endarterectomies 3.Left lower extremity angiogram including left iliofemoral angiogram 4.Stent placement to the left external iliac artery with 8 mm diameter by 6 cm length and an 8 mm diameter by 4 cm length life star stent 5.Angioplasty of the left SFA and popliteal artery with 5 mm diameter angioplasty balloon 6.Stent placement x2 to the left SFA and popliteal artery with 6 mm diameter by 25 cm length Viabahn stent and a 7 mm diameter by 15 cm length Viabahn stent 7.Right lower extremity angiogram 8.Angioplasty of the right SFA and above-knee popliteal artery with 5 mm diameter angioplasty balloon 9.Stent placement x2 to the right SFA and popliteal artery with 6 mm diameter by 25 cm length Viabahn stent and 6 mm diameter by 10 cm length Viabahn stent  Patient with some continued discomfort to the bilateral groin incisions. No issues overnight.  Antihypertensive medications held for low blood pressure.  Asymptomatic.  Working with physical therapy, does not want discharge to SNF however would like to continue to work with physical therapy while in house.  Objective: Vitals:   05/25/20 0218 05/25/20 0459 05/25/20 0611 05/25/20 0729  BP: (!) 95/52 120/65 (!) 108/57 (!) 100/53  Pulse: 72 83 85 82  Resp: 20   16  Temp: 99 F (37.2 C) 98.8 F (37.1 C)  98 F (36.7 C)  TempSrc: Oral Oral  Oral  SpO2: 94% 96%  96%  Weight:      Height:        Intake/Output Summary (Last 24 hours) at 05/25/2020 1022 Last data filed at 05/25/2020 0950 Gross per 24 hour  Intake 484.52 ml  Output 1450 ml  Net -965.48 ml   Physical Exam: A&Ox3, NAD CV: RRR Pulmonary: CTA Bilaterally Abdomen: Soft, Non-tender, Non-distended Right Groin:              OR dressing removed, clean and dry.  No swelling or drainage.  Some surrounding ecchymosis noted. Left Groin:             OR dressing removed, clean and dry.  No swelling or drainage.  Some surrounding ecchymosis noted. Vascular:             Right Lower Extremity: Thigh soft.  Calf soft.  Extremities warm distally to toes.  Good capillary refill.  Minimal edema.  Motor/sensory is intact.             Left Lower Extremity:  Thigh soft.  Calf soft.  Extremities warm distally to toes.  Good capillary refill.  Minimal edema.  Motor/sensory is intact.   Laboratory: CBC    Component Value Date/Time   WBC 13.5 (H) 05/25/2020 0548   HGB 11.3 (L) 05/25/2020 0548   HCT 34.4 (L) 05/25/2020 0548   PLT 150 05/25/2020 0548   BMET    Component Value Date/Time   NA 136 05/25/2020 0548   K 4.3 05/25/2020 0548   CL 102 05/25/2020 0548   CO2 28 05/25/2020 0548   GLUCOSE 102 (H) 05/25/2020 0548   BUN 15 05/25/2020 0548   CREATININE 0.81 05/25/2020 0548   CALCIUM 8.0 (L) 05/25/2020 0548   GFRNONAA >60 05/25/2020 0548   GFRAA >60 05/25/2020 0548   Assessment/Planning: The patient is a 69 year old male with multiple medical issues including known atherosclerotic disease to the bilateral lower extremities s/p  bilateral femoral endarterectomy - POD#2  1)  tolerating advancement in diet 2) discontinued Foley catheter - passed trial of void 3)  worked with physical therapy who recommended discharge to SNF.  Patient refusing SNF would like to continue to work with physical therapy while in house. 4)  dopamine successfully weaned yesterday 5)  at this time, we have been holding the patient's antihypertensives for hypotension.  No active signs of bleeding. Will contact cardiology for their recommendations and possible medication changes / management. 6) hope for discharge home tomorrow  Discussed with Dr. Charlie Pitter [facility] PA-C 05/25/2020 10:22 AM

## 2020-05-25 NOTE — TOC Progression Note (Signed)
Transition of Care [facility]) - Progression Note    Patient Details  Name: Dennis Preston MRN: [id number] Date of Birth: July 04, 1951  Transition of Care [facility]) CM/SW [place], RN Phone Number: 05/25/2020, 1:43 PM  Clinical Narrative:   Met with the patient to discuss bed offers, He asked that I review them with his wife, I called Inez Catalina his wife and reviewed the bed offers, she chose [facility], The patient has had the covid vaccines, I called Tonya at [facility] and accepted the bed offer         Expected Discharge Plan and Services                                                 Social Determinants of Health (SDOH) Interventions    Readmission Risk Interventions No flowsheet data found.

## 2020-05-25 NOTE — TOC Progression Note (Signed)
Transition of Care [facility]) - Progression Note    Patient Details  Name: Dennis Preston MRN: [id number] Date of Birth: Apr 01, 1951  Transition of Care [facility]) CM/SW Contact  Barrie Dunker, RN Phone Number: 05/25/2020, 9:19 AM  Clinical Narrative:   Nadyne Coombes and bed search completed, Will review bed offers once obtained         Expected Discharge Plan and Services                                                 Social Determinants of Health (SDOH) Interventions    Readmission Risk Interventions No flowsheet data found.

## 2020-05-25 NOTE — Progress Notes (Signed)
Physical Therapy Treatment Patient Details Name: Dennis Preston MRN: [id number] DOB: 09-03-1951 Today's Date: 05/25/2020    History of Present Illness Pt is a 69 yo male that underwent bilateral femoral endarterectomy and stenting. PMH of COPD, CAD, past MI, PVD, CHF, anxiety, depression.    PT Comments    Pt alert, with mod-max encouragement agreeable to participate with therapy today. Pt reported he is having significant leg pain (anterior hip at surgical incision sites). Compared to previous session, pt needed significantly more assistance with mobility. Bed mobility modA, and total for complete repositioning in bedx2 for safety. Able to sit EOB for at least 10 minutes, initially minA and progressed to supervision with time. With max encouragement, sit <> stand performed with minA, pt reliant on pulling on RW with PT stabilization. Unable to tolerate standing >20secs due to pain and lightheadedness. BP assessed in supine and and sitting, WFLs, and HR and spO2 monitored throughout. Pt varied from 86-95% on room air, HR WFLs. Requested 2L after returning to rest due to being "wore out" and [place] placed on pt, RN notified (pt uses oxygen at home PRN and at night). The patient would benefit from further skilled PT intervention to return pt to PLOF as able.     Follow Up Recommendations  SNF     Equipment Recommendations  Other (comment) (TBD at next venue of care)    Recommendations for Other Services       Precautions / Restrictions Precautions Precautions: Fall Precaution Comments: watch vitals Restrictions Weight Bearing Restrictions: No    Mobility  Bed Mobility Overal bed mobility: Needs Assistance Bed Mobility: Supine to Sit;Sit to Supine     Supine to sit: Mod assist;HOB elevated Sit to supine: Mod assist;HOB elevated      Transfers Overall transfer level: Needs assistance Equipment used: Rolling walker (2 wheeled) Transfers: Sit to/from Stand Sit to Stand: Min  assist         General transfer comment: very effortful for pt, pulls on RW heavily. minA for stabilization, increased difficulty with erect standing due to pain  Ambulation/Gait             General Gait Details: deferred due to pt pain and comlpaints of light headedness/dizziness   Stairs             Wheelchair Mobility    Modified Rankin (Stroke Patients Only)       Balance Overall balance assessment: Needs assistance Sitting-balance support: Feet supported Sitting balance-Leahy Scale: Good Sitting balance - Comments: pt did initially need minA to sit, progressed to supervision with time     Standing balance-Leahy Scale: Poor Standing balance comment: reliant on UE support                            Cognition Arousal/Alertness: Awake/alert Behavior During Therapy: WFL for tasks assessed/performed Overall Cognitive Status: Within Functional Limits for tasks assessed                                        Exercises      General Comments        Pertinent Vitals/Pain Pain Score: 7  Pain Location: at rest in bilateral anterior hips, with mobility pt reported worse pain Pain Descriptors / Indicators: Tender;Burning Pain Intervention(s): Limited activity within patient's tolerance;Monitored during session;Premedicated before session;Repositioned    Home  Living                      Prior Function            PT Goals (current goals can now be found in the care plan section) Progress towards PT goals: Progressing toward goals    Frequency    Min 2X/week      PT Plan Current plan remains appropriate    Co-evaluation              AM-PAC PT "6 Clicks" Mobility   Outcome Measure  Help needed turning from your back to your side while in a flat bed without using bedrails?: A Little Help needed moving from lying on your back to sitting on the side of a flat bed without using bedrails?: A Lot Help  needed moving to and from a bed to a chair (including a wheelchair)?: A Lot Help needed standing up from a chair using your arms (e.g., wheelchair or bedside chair)?: A Lot Help needed to walk in hospital room?: Total Help needed climbing 3-5 steps with a railing? : Total 6 Click Score: 11    End of Session Equipment Utilized During Treatment: Gait belt Activity Tolerance: Patient tolerated treatment well;Treatment limited secondary to medical complications (Comment);Other (comment) Patient left: in bed;with bed alarm set;with call bell/phone within reach Nurse Communication: Mobility status PT Visit Diagnosis: Other abnormalities of gait and mobility (R26.89);Muscle weakness (generalized) (M62.81);Pain;Unsteadiness on feet (R26.81) Pain - Right/Left: Right (and left) Pain - part of body: Hip     Time: 4481-8563 PT Time Calculation (min) (ACUTE ONLY): 40 min  Charges:  $Therapeutic Exercise: 38-52 mins                    Olga Coaster PT, DPT 2:32 PM,05/25/20

## 2020-05-25 NOTE — Care Management Important Message (Signed)
Important Message  Patient Details  Name: Dennis Preston MRN: [id number] Date of Birth: [birth year]   Medicare Important Message Given:  Yes  Initial Medicare IM given by Patient Access Associate on 05/25/2020 at 11:46am.   [name] 05/25/2020, 12:30 PM

## 2020-05-25 NOTE — Consult Note (Signed)
[facility]  Patient ID: Dennis AfricaDonald R Preston, MRN: [id number], DOB/AGE: [birth year] 69 y.o. Admit date: 05/23/2020   Date of Consult: 05/25/2020 Primary Physician: Dennis [facility], Vishwanath, MD [facility]  Chief Complaint: No chief complaint on file.  Reason for Consult: Hypotension  HPI: [age] y.o. male with known coronary artery disease status post coronary bypass graft in the remote past with 4 grafts previously on appropriate main antiplatelet medication management and antihypertensives for which she has been relatively stable from the cardiac standpoint.  He has a recent cardiac catheterization showing patent coronary bypass grafts with no evidence of ischemia.  Additionally the patient has had normal LV systolic function with mild valvular heart disease and ejection fraction of 50%.  He does have chronic shortness of breath with COPD and history of paroxysmal nonvalvular atrial fibrillation but no evidence of palpitations or other rhythm disturbances at this time.  The patient was admitted for peripheral vascular intervention for which he performed and did well with no evidence of complication.  Since then his blood pressure has been quite low requiring some fluid resuscitation.  Currently there is no evidence of congestive heart failure or angina and/or further issue from the cardiac standpoint.  He has been on medication management in the past including furosemide atorvastatin and Entresto.  With his low blood pressure and concerns of weakness we will hold his Entresto at this time  Past Medical History:  Diagnosis Date  . Alcohol abuse   . Amaurosis fugax   . Atrial fibrillation (HCC)   . Carotid stenosis   . CHF (congestive heart failure) (HCC)   . Coagulopathy (HCC)   . COPD (chronic obstructive pulmonary disease) (HCC)   . Coronary artery disease   . Depression   . Hyperlipemia   . Hypotension   . Leucocytosis   . Liver dysfunction   .  Myocardial infarction (HCC)    non-STEMI. approx 2016  . Peripheral artery disease (HCC)    legs  . Tobacco abuse   . Wears dentures    full upper and lower      Surgical History:  Past Surgical History:  Procedure Laterality Date  . CARDIAC CATHETERIZATION N/A 07/10/2015   Procedure: Right/Left Heart Cath and Coronary Angiography;  Surgeon: Alwyn Peawayne D [facility], MD;  Location: [facility] INVASIVE CV LAB;  Service: Cardiovascular;  Laterality: N/A;  . CORONARY ARTERY BYPASS GRAFT  07/13/2015   [facility].  4 vessel  . ENDARTERECTOMY FEMORAL Bilateral 05/23/2020   Procedure: ENDARTERECTOMY FEMORAL BILATERAL SUPERFICIAL FEMORAL ARTERY STENTS ;  Surgeon: Renford DillsSchnier, Gregory G, MD;  Location: [facility] ORS;  Service: Vascular;  Laterality: Bilateral;  . INSERTION OF ILIAC STENT Left 05/23/2020   Procedure: INSERTION OF ILIAC STENT;  Surgeon: Renford DillsSchnier, Gregory G, MD;  Location: [facility] ORS;  Service: Vascular;  Laterality: Left;  . LEFT HEART CATH AND CORS/GRAFTS ANGIOGRAPHY N/A 01/23/2020   Procedure: LEFT HEART CATH AND CORS/GRAFTS ANGIOGRAPHY;  Surgeon: Alwyn Peaallwood, Dwayne D, MD;  Location: [facility] INVASIVE CV LAB;  Service: Cardiovascular;  Laterality: N/A;  . LOWER EXTREMITY ANGIOGRAPHY Right 04/03/2020   Procedure: LOWER EXTREMITY ANGIOGRAPHY;  Surgeon: Renford DillsSchnier, Gregory G, MD;  Location: [facility] INVASIVE CV LAB;  Service: Cardiovascular;  Laterality: Right;  . OLECRANON BURSECTOMY Left 05/06/2019   Procedure: OLECRANON BURSECTOMY AND DEBRIDEMENT;  Surgeon: Signa KellPatel, Sunny, MD;  Location: [facility] ORS;  Service: Orthopedics;  Laterality: Left;     Home Meds: Prior to Admission medications   Medication Sig Start Date End Date Taking? Authorizing  Provider  acetaminophen (TYLENOL) 325 MG tablet Take 650 mg by mouth every 6 (six) hours as needed for mild pain or headache.    Yes [provider]  albuterol (PROVENTIL) (2.5 MG/3ML) 0.083% nebulizer solution Take 3 mLs (2.5 mg total) by nebulization every 6 (six) hours as needed  for wheezing or shortness of breath. 02/15/20  Yes Lurene Shadow, MD  albuterol (VENTOLIN HFA) 108 (90 Base) MCG/ACT inhaler Inhale 1-2 puffs into the lungs every 6 (six) hours as needed for wheezing or shortness of breath. 02/15/20  Yes Lurene Shadow, MD  aspirin EC 81 MG tablet Take 1 tablet (81 mg total) by mouth daily. Swallow whole. Patient not taking: Reported on 05/16/2020 04/03/20 04/03/21 Yes Schnier, Latina Craver, MD  atorvastatin (LIPITOR) 80 MG tablet Take 1 tablet (80 mg total) by mouth daily at 6 PM. 07/10/15  Yes Sainani, Rolly Pancake, MD  clopidogrel (PLAVIX) 75 MG tablet Take 75 mg by mouth every evening.  04/18/16  Yes [provider]  Fluticasone-Umeclidin-Vilant (TRELEGY ELLIPTA) 100-62.5-25 MCG/INH AEPB Inhale 1 puff into the lungs every other day.  01/27/20  Yes [provider]  furosemide (LASIX) 40 MG tablet Take 40 mg by mouth daily.  12/26/19  Yes [provider]  sacubitril-valsartan (ENTRESTO) 24-26 MG Take 1 tablet by mouth 2 (two) times daily.  01/11/20  Yes [provider]  HYDROcodone-homatropine (HYCODAN) 5-1.5 MG/5ML syrup Take 5 mLs by mouth every 8 (eight) hours as needed for cough. 02/15/20   Lurene Shadow, MD    Inpatient Medications:  . aspirin EC  81 mg Oral Daily  . atorvastatin  80 mg Oral q1800  . clopidogrel  75 mg Oral QPM  . docusate sodium  100 mg Oral Daily  . furosemide  40 mg Oral Daily  . ketorolac  15 mg Intravenous Q6H  . umeclidinium-vilanterol  1 puff Inhalation Daily   . famotidine (PEPCID) IV 20 mg (05/25/20 0857)  . magnesium sulfate bolus IVPB    . nitroGLYCERIN      Allergies: No Known Allergies  Social History   Socioeconomic History  . Marital status: Married    Spouse name: Not on file  . Number of children: Not on file  . Years of education: Not on file  . Highest education level: Not on file  Occupational History  . Not on file  Tobacco Use  . Smoking status: Former Smoker    Packs/day: 1.00     Years: 54.00    Pack years: 54.00    Types: Cigarettes    Quit date: 03/16/2020    Years since quitting: 0.1  . Smokeless tobacco: Never Used  . Tobacco comment: was 2.5 PPD for 50 yrs until 2016, quit a "few days ago"  Vaping Use  . Vaping Use: Never used  Substance and Sexual Activity  . Alcohol use: Not Currently    Comment: quit over 15 yrs ago  . Drug use: Not Currently  . Sexual activity: Not on file  Other Topics Concern  . Not on file  Social History Narrative  . Not on file   Social Determinants of Health   Financial Resource Strain:   . Difficulty of Paying Living Expenses: Not on file  Food Insecurity:   . Worried About Programme researcher, broadcasting/film/video in the Last Year: Not on file  . Ran Out of Food in the Last Year: Not on file  Transportation Needs:   . Lack of Transportation (Medical): Not on file  .  Lack of Transportation (Non-Medical): Not on file  Physical Activity:   . Days of Exercise per Week: Not on file  . Minutes of Exercise per Session: Not on file  Stress:   . Feeling of Stress : Not on file  Social Connections:   . Frequency of Communication with Friends and Family: Not on file  . Frequency of Social Gatherings with Friends and Family: Not on file  . Attends Religious Services: Not on file  . Active Member of Clubs or Organizations: Not on file  . Attends Banker Meetings: Not on file  . Marital Status: Not on file  Intimate Partner Violence:   . Fear of Current or Ex-Partner: Not on file  . Emotionally Abused: Not on file  . Physically Abused: Not on file  . Sexually Abused: Not on file     Family History  Problem Relation Age of Onset  . Depression Sister      Review of Systems Positive for weakness Negative for: General:  chills, fever, night sweats or weight changes.  Cardiovascular: PND orthopnea syncope dizziness  Dermatological skin lesions rashes Respiratory: Cough congestion Urologic: Frequent urination urination at night  and hematuria Abdominal: negative for nausea, vomiting, diarrhea, bright red blood per rectum, melena, or hematemesis Neurologic: negative for visual changes, and/or hearing changes  All other systems reviewed and are otherwise negative except as noted above.  Labs: No results for input(s): CKTOTAL, CKMB, TROPONINI in the last 72 hours. Lab Results  Component Value Date   WBC 13.5 (H) 05/25/2020   HGB 11.3 (L) 05/25/2020   HCT 34.4 (L) 05/25/2020   MCV 94.0 05/25/2020   PLT 150 05/25/2020    Recent Labs  Lab 05/25/20 0548  NA 136  K 4.3  CL 102  CO2 28  BUN 15  CREATININE 0.81  CALCIUM 8.0*  GLUCOSE 102*   No results found for: CHOL, HDL, LDLCALC, TRIG No results found for: DDIMER  Radiology/Studies:  DG C-Arm 1-60 Min-No Report  Result Date: 05/23/2020 Fluoroscopy was utilized by the requesting physician.  No radiographic interpretation.   VAS [place] ABI WITH/WO TBI  Result Date: 04/30/2020 LOWER EXTREMITY DOPPLER STUDY Indications: S/P Angioplasty.  Vascular Interventions: 04/03/2020: Contrast injection Bilateral Lower Extremity                         for distal Runoff. PTA Right Profunda Femoris Artery. Performing Technologist: Debbe Bales RVS  Examination Guidelines: A complete evaluation includes at minimum, Doppler waveform signals and systolic blood pressure reading at the level of bilateral brachial, anterior tibial, and posterior tibial arteries, when vessel segments are accessible. Bilateral testing is considered an integral part of a complete examination. Photoelectric Plethysmograph (PPG) waveforms and toe systolic pressure readings are included as required and additional duplex testing as needed. Limited examinations for reoccurring indications may be performed as noted.  ABI Findings: +---------+------------------+-----+----------+--------+ Right    Rt Pressure (mmHg)IndexWaveform  Comment  +---------+------------------+-----+----------+--------+ Brachial  133                                       +---------+------------------+-----+----------+--------+ ATA      93                0.70 monophasic         +---------+------------------+-----+----------+--------+ PTA      94  0.71 monophasic         +---------+------------------+-----+----------+--------+ Great Toe76                0.57 Abnormal           +---------+------------------+-----+----------+--------+ +---------+------------------+-----+----------+-------+ Left     Lt Pressure (mmHg)IndexWaveform  Comment +---------+------------------+-----+----------+-------+ Brachial 99                                       +---------+------------------+-----+----------+-------+ ATA      90                0.68 monophasic        +---------+------------------+-----+----------+-------+ PTA      85                0.64 monophasic        +---------+------------------+-----+----------+-------+ Great Toe63                0.47 Abnormal          +---------+------------------+-----+----------+-------+ +-------+-----------+-----------+------------+------------+ ABI/TBIToday's ABIToday's TBIPrevious ABIPrevious TBI +-------+-----------+-----------+------------+------------+ Right  .71        .57                                 +-------+-----------+-----------+------------+------------+ Left   .68        .47                                 +-------+-----------+-----------+------------+------------+  Summary: Right: Resting right ankle-brachial index indicates moderate right lower extremity arterial disease. The right toe-brachial index is abnormal. Left: Resting left ankle-brachial index indicates moderate left lower extremity arterial disease. The left toe-brachial index is abnormal.  *See table(s) above for measurements and observations.  Electronically signed by Levora Dredge MD on 04/30/2020 at 11:54:50 AM.    Final     EKG: Normal sinus  rhythm  Weights: Filed Weights   05/23/20 0945 05/23/20 1839  Weight: 77.6 kg 77.5 kg     Physical Exam: Blood pressure (!) 109/57, pulse 77, temperature 98.2 F (36.8 C), temperature source Oral, resp. rate 18, height  (1.727 m), weight 77.5 kg, SpO2 90 %. Body mass index is 25.98 kg/m. General: Well developed, well nourished, in no acute distress. Head eyes ears nose throat: Normocephalic, atraumatic, sclera non-icteric, no xanthomas, nares are without discharge. No apparent thyromegaly and/or mass  Lungs: Normal respiratory effort.  Diffuse wheezes, no rales, no rhonchi.  Heart: RRR with normal S1 S2. no murmur gallop, no rub, PMI is normal size and placement, carotid upstroke normal without bruit, jugular venous pressure is normal Abdomen: Soft, non-tender, non-distended with normoactive bowel sounds. No hepatomegaly. No rebound/guarding. No obvious abdominal masses. Abdominal aorta is normal size without bruit Extremities: Trace edema. no cyanosis, no clubbing, no ulcers  Peripheral : 2+ bilateral upper extremity pulses, 1+ bilateral femoral pulses, 0+ bilateral dorsal pedal pulse Neuro: Alert and oriented. No facial asymmetry. No focal deficit. Moves all extremities spontaneously. Musculoskeletal: Normal muscle tone without kyphosis Psych:  Responds to questions appropriately with a normal affect.    Assessment: 69 year old male with severe peripheral vascular disease coronary bypass surgery with no current evidence of congestive heart failure or anginal symptoms and COPD status post peripheral vascular procedure now with hypotension but no evidence of new cardiac symptoms  Plan: 1.  Hold beta-blocker and Entresto at this time due to hypotension and weakness 2.  Continue supportive care post surgery and/or vascular intervention with rehabilitation without restriction 3.  No current urgent need for cardiac diagnostics with recent cardiac catheterization with patent grafts 4.   Further treatment thereof as necessary with rehabilitation  Signed, Lamar Blinks M.D. [facility] 05/25/2020, 1:41 PM

## 2020-05-25 NOTE — Progress Notes (Signed)
Patient states pain from his groin to his knees. Patient has productive cough. Patient has severe burning pain where his incisions are when he coughs. He stated it feels like his stitches ripped. Both honeycomb dressings are dry and intact. Patient has been running a soft bp. However, patient bp is currently 120/65. Patient is concerned about his bp constantly dropping. He stated he thinks it has to do with his heart medication. Patient given Percocet 2 tablets. Will continue to monitor.

## 2020-05-26 LAB — BASIC METABOLIC PANEL
Anion gap: 6 (ref 5–15)
BUN: 16 mg/dL (ref 8–23)
CO2: 31 mmol/L (ref 22–32)
Calcium: 8 mg/dL — ABNORMAL LOW (ref 8.9–10.3)
Chloride: 99 mmol/L (ref 98–111)
Creatinine, Ser: 1.02 mg/dL (ref 0.61–1.24)
GFR calc Af Amer: >60 mL/min (ref >60)
GFR calc non Af Amer: >60 mL/min (ref >60)
Glucose, Bld: 99 mg/dL (ref 70–99)
Potassium: 4.4 mmol/L (ref 3.5–5.1)
Sodium: 136 mmol/L (ref 135–145)

## 2020-05-26 LAB — CBC
HCT: 34.4 % — ABNORMAL LOW (ref 39.0–52.0)
Hemoglobin: 11.2 g/dL — ABNORMAL LOW (ref 13.0–17.0)
MCH: 30.9 pg (ref 26.0–34.0)
MCHC: 32.6 g/dL (ref 30.0–36.0)
MCV: 95 fL (ref 80.0–100.0)
Platelets: 134 10*3/uL — ABNORMAL LOW (ref 150–400)
RBC: 3.62 MIL/uL — ABNORMAL LOW (ref 4.22–5.81)
RDW: 14.5 % (ref 11.5–15.5)
WBC: 16.8 10*3/uL — ABNORMAL HIGH (ref 4.0–10.5)
nRBC: 0 % (ref 0.0–0.2)

## 2020-05-26 LAB — MAGNESIUM: Magnesium: 1.9 mg/dL (ref 1.7–2.4)

## 2020-05-26 NOTE — Progress Notes (Signed)
Asked patient about his plavix and he stated that he had already taken it for today.

## 2020-05-26 NOTE — Progress Notes (Signed)
Vascular Surgery Daily Progress Note  Subjective: (05/23/20) 1. Left common femoral, profunda femoris, and superficial femoral artery endarterectomies 2. Right common femoral, profunda femoris, and superficial femoral artery endarterectomies 3.Left lower extremity angiogram including left iliofemoral angiogram 4.Stent placement to the left external iliac artery with 8 mm diameter by 6 cm length and an 8 mm diameter by 4 cm length life star stent 5.Angioplasty of the left SFA and popliteal artery with 5 mm diameter angioplasty balloon 6.Stent placement x2 to the left SFA and popliteal artery with 6 mm diameter by 25 cm length Viabahn stent and a 7 mm diameter by 15 cm length Viabahn stent 7.Right lower extremity angiogram 8.Angioplasty of the right SFA and above-knee popliteal artery with 5 mm diameter angioplasty balloon 9.Stent placement x2 to the right SFA and popliteal artery with 6 mm diameter by 25 cm length Viabahn stent and 6 mm diameter by 10 cm length Viabahn stent  Patient with some continued discomfort to the bilateral groin incisions. No issues overnight.  Antihypertensive medications held for low blood pressure.  Asymptomatic.  Working with physical therapy, does not want discharge to SNF however would like to continue to work with physical therapy while in house.  Objective: Vitals:   05/25/20 2112 05/25/20 2153 05/25/20 2338 05/26/20 0411  BP: 98/60  94/70 116/63  Pulse: 99  93 61  Resp: 20  20 19   Temp: 99 F (37.2 C) 97.8 F (36.6 C) 99.3 F (37.4 C) 97.7 F (36.5 C)  TempSrc: Oral Oral Oral Oral  SpO2: 92%  97% 100%  Weight:      Height:        Intake/Output Summary (Last 24 hours) at 05/26/2020 0813 Last data filed at 05/25/2020 1934 Gross per 24 hour  Intake 240 ml  Output 825 ml  Net -585 ml   Physical Exam: A&Ox3, NAD CV: RRR Pulmonary: CTA Bilaterally Abdomen: Soft, Non-tender, Non-distended Right Groin:             , clean  and dry.  No swelling or drainage.  Some surrounding ecchymosis noted. Left Groin:           clean and dry.  No swelling or drainage.  Some surrounding ecchymosis noted. Vascular:             Right Lower Extremity: Thigh soft.  Calf soft.  Extremities warm distally to toes.  Good capillary refill.  Minimal edema.  Motor/sensory is intact.              Left Lower Extremity:  Thigh soft.  Calf soft.  Extremities warm distally to toes.  Good capillary refill.  Minimal edema.  Motor/sensory is intact.   Laboratory: CBC    Component Value Date/Time   WBC 16.8 (H) 05/26/2020 0445   HGB 11.2 (L) 05/26/2020 0445   HCT 34.4 (L) 05/26/2020 0445   PLT 134 (L) 05/26/2020 0445   BMET    Component Value Date/Time   NA 136 05/26/2020 0445   K 4.4 05/26/2020 0445   CL 99 05/26/2020 0445   CO2 31 05/26/2020 0445   GLUCOSE 99 05/26/2020 0445   BUN 16 05/26/2020 0445   CREATININE 1.02 05/26/2020 0445   CALCIUM 8.0 (L) 05/26/2020 0445   GFRNONAA >60 05/26/2020 0445   GFRAA >60 05/26/2020 0445   Assessment/Planning: The patient is a 69 year old male with multiple medical issues including known atherosclerotic disease to the bilateral lower extremities s/p bilateral femoral endarterectomy - POD#2  1)  tolerating  advancement in diet 2) has not ambulated. Will ask PT to work with patient

## 2020-05-26 NOTE — Progress Notes (Signed)
[facility] Cardiology [facility] Encounter Note  Patient: Dennis Preston / Admit Date: 05/23/2020 / Date of Encounter: 05/26/2020, 9:23 AM   Subjective: Patient claims to feel much better today than yesterday.  Less weakness fatigue and/or postsurgical issues.  Patient has had improvements of blood pressure after discontinuation of previous blood pressure medications.  COPD stable and no evidence of chest discomfort or atrial fibrillation or angina.  Review of Systems: Positive for: Weakness Negative for: Vision change, hearing change, syncope, dizziness, nausea, vomiting,diarrhea, bloody stool, stomach pain, cough, congestion, diaphoresis, urinary frequency, urinary pain,skin lesions, skin rashes Others previously listed  Objective: Telemetry: Normal sinus rhythm Physical Exam: Blood pressure 112/60, pulse 84, temperature 97.8 F (36.6 C), temperature source Oral, resp. rate 19, height 5\' 8"  (1.727 m), weight 77.5 kg, SpO2 97 %. Body mass index is 25.98 kg/m. General: Well developed, well nourished, in no acute distress. Head: Normocephalic, atraumatic, sclera non-icteric, no xanthomas, nares are without discharge. Neck: No apparent masses Lungs: Normal respirations with few wheezes, no rhonchi, no rales , no crackles   Heart: Regular rate and rhythm, normal S1 S2, no murmur, no rub, no gallop, PMI is normal size and placement, carotid upstroke normal without bruit, jugular venous pressure normal Abdomen: Soft, non-tender, non-distended with normoactive bowel sounds. No hepatosplenomegaly. Abdominal aorta is normal size without bruit Extremities: Trace edema, no clubbing, no cyanosis, no ulcers,  Peripheral: 2+ radial, 2+ femoral, 0+ dorsal pedal pulses Neuro: Alert and oriented. Moves all extremities spontaneously. Psych:  Responds to questions appropriately with a normal affect.   Intake/Output Summary (Last 24 hours) at 05/26/2020 0923 Last data filed at 05/26/2020 0845 Gross per  24 hour  Intake 240 ml  Output 1075 ml  Net -835 ml    Inpatient Medications:  . aspirin EC  81 mg Oral Daily  . atorvastatin  80 mg Oral q1800  . clopidogrel  75 mg Oral QPM  . docusate sodium  100 mg Oral Daily  . furosemide  40 mg Oral Daily  . ketorolac  15 mg Intravenous Q6H  . umeclidinium-vilanterol  1 puff Inhalation Daily   Infusions:  . famotidine (PEPCID) IV 20 mg (05/25/20 2200)  . magnesium sulfate bolus IVPB    . nitroGLYCERIN      Labs: Recent Labs    05/25/20 0548 05/26/20 0445  NA 136 136  K 4.3 4.4  CL 102 99  CO2 28 31  GLUCOSE 102* 99  BUN 15 16  CREATININE 0.81 1.02  CALCIUM 8.0* 8.0*  MG 1.8 1.9   No results for input(s): AST, ALT, ALKPHOS, BILITOT, PROT, ALBUMIN in the last 72 hours. Recent Labs    05/25/20 0548 05/26/20 0445  WBC 13.5* 16.8*  HGB 11.3* 11.2*  HCT 34.4* 34.4*  MCV 94.0 95.0  PLT 150 134*   No results for input(s): CKTOTAL, CKMB, TROPONINI in the last 72 hours. Invalid input(s): POCBNP No results for input(s): HGBA1C in the last 72 hours.   Weights: Filed Weights   05/23/20 0945 05/23/20 1839  Weight: 77.6 kg 77.5 kg     Radiology/Studies:  DG C-Arm 1-60 Min-No Report  Result Date: 05/23/2020 Fluoroscopy was utilized by the requesting physician.  No radiographic interpretation.   VAS 05/25/2020 ABI WITH/WO TBI  Result Date: 04/30/2020 LOWER EXTREMITY DOPPLER STUDY Indications: S/P Angioplasty.  Vascular Interventions: 04/03/2020: Contrast injection Bilateral Lower Extremity  for distal Runoff. PTA Right Profunda Femoris Artery. Performing Technologist: Debbe Bales RVS  Examination Guidelines: A complete evaluation includes at minimum, Doppler waveform signals and systolic blood pressure reading at the level of bilateral brachial, anterior tibial, and posterior tibial arteries, when vessel segments are accessible. Bilateral testing is considered an integral part of a complete examination.  Photoelectric Plethysmograph (PPG) waveforms and toe systolic pressure readings are included as required and additional duplex testing as needed. Limited examinations for reoccurring indications may be performed as noted.  ABI Findings: +---------+------------------+-----+----------+--------+ Right    Rt Pressure (mmHg)IndexWaveform  Comment  +---------+------------------+-----+----------+--------+ Brachial 133                                       +---------+------------------+-----+----------+--------+ ATA      93                0.70 monophasic         +---------+------------------+-----+----------+--------+ PTA      94                0.71 monophasic         +---------+------------------+-----+----------+--------+ Great Toe76                0.57 Abnormal           +---------+------------------+-----+----------+--------+ +---------+------------------+-----+----------+-------+ Left     Lt Pressure (mmHg)IndexWaveform  Comment +---------+------------------+-----+----------+-------+ Brachial 99                                       +---------+------------------+-----+----------+-------+ ATA      90                0.68 monophasic        +---------+------------------+-----+----------+-------+ PTA      85                0.64 monophasic        +---------+------------------+-----+----------+-------+ Great Toe63                0.47 Abnormal          +---------+------------------+-----+----------+-------+ +-------+-----------+-----------+------------+------------+ ABI/TBIToday's ABIToday's TBIPrevious ABIPrevious TBI +-------+-----------+-----------+------------+------------+ Right  .71        .57                                 +-------+-----------+-----------+------------+------------+ Left   .68        .47                                 +-------+-----------+-----------+------------+------------+  Summary: Right: Resting right ankle-brachial  index indicates moderate right lower extremity arterial disease. The right toe-brachial index is abnormal. Left: Resting left ankle-brachial index indicates moderate left lower extremity arterial disease. The left toe-brachial index is abnormal.  *See table(s) above for measurements and observations.  Electronically signed by Levora Dredge MD on 04/30/2020 at 11:54:50 AM.    Final      Assessment and Recommendation  69 y.o. male with known coronary disease status post coronary bypass graft hypertension hyperlipidemia chronic obstructive pulmonary disease with normal LV systolic function by recent echocardiogram history of paroxysmal nonvalvular atrial fibrillation with peripheral vascular disease procedure with no complications but hypotension afterwards now improved after  discontinuation of blood pressure medications 1.  Continue to abstain from blood pressure medication management due to hypotension 2.  Continue furosemide for edema 3.  Peripheral vascular disease treatment with high intensity cholesterol therapy and antiplatelet medication management 4.  No further cardiac diagnostics necessary at this time 5.  Proceed to physical rehabilitation from surgery without restriction 6.  Okay for discharge to home from cardiac standpoint with physical rehabilitation with potential reinstatement of outpatient medication management 1 follow-up with Dr. Juliann Pares in 1 week 7.  Call if further questions otherwise would assume patient is discharged after physical therapy today  Signed, Arnoldo Hooker M.D. FACC

## 2020-05-27 NOTE — Progress Notes (Signed)
Physical Therapy Treatment Patient Details Name: JUDITH CAMPILLO MRN: [id number] DOB: [birth year] Today's Date: 05/27/2020    History of Present Illness Pt is a 69 yo male that underwent bilateral femoral endarterectomy and stenting. PMH of COPD, CAD, past MI, PVD, CHF, anxiety, depression.    PT Comments    Patient is in bed upon PT arrival. Agreeable to participate in therapy, has forgotten he has been seen already this hospitalization by therapy team. Supine interventions tolerated with patient demonstrating understanding to perform between therapy sessions. Supine to sit requires HOB elevated and assistance for final segment of transition due to pain in groin. Patient tolerated standing position better this session with cueing for 5 breaths prior to attempt at ambulation. Short duration ambulation to chair with RW performed and patient declined further treatment after sitting in chair. Chair alarm set and all needs met. Current POC remains appropriate at this time.    Follow Up Recommendations  SNF     Equipment Recommendations  Other (comment)    Recommendations for Other Services       Precautions / Restrictions Precautions Precautions: Fall Precaution Comments: watch vitals-spO2 Restrictions Weight Bearing Restrictions: No    Mobility  Bed Mobility Overal bed mobility: Needs Assistance Bed Mobility: Supine to Sit     Supine to sit: Min assist;HOB elevated     General bed mobility comments: Patient requires Min A for supine to sit with HOB elevated.  Transfers Overall transfer level: Needs assistance Equipment used: Rolling walker (2 wheeled) Transfers: Sit to/from Stand Sit to Stand: Min assist;From elevated surface         General transfer comment: Patient tolerated STS with Min A from elevated surface (knees at 90 90 from floor) cues for hand placement with walker.  Ambulation/Gait Ambulation/Gait assistance: Min guard Gait Distance (Feet): 3  Feet Assistive device: Rolling walker (2 wheeled)       General Gait Details: to chair, patient declined further due to fatigue.   Stairs             Wheelchair Mobility    Modified Rankin (Stroke Patients Only)       Balance Overall balance assessment: Needs assistance Sitting-balance support: Feet supported Sitting balance-Leahy Scale: Good Sitting balance - Comments: able to sit EOB with UE support and LE support     Standing balance-Leahy Scale: Poor Standing balance comment: reliant on UE support on RW                            Cognition Arousal/Alertness: Awake/alert Behavior During Therapy: WFL for tasks assessed/performed Overall Cognitive Status: Within Functional Limits for tasks assessed                                        Exercises General Exercises - Lower Extremity Ankle Circles/Pumps: Both;10 reps;Supine;Strengthening Quad Sets: Strengthening;Both;10 reps;Supine Gluteal Sets: Strengthening;Both;10 reps;Supine Heel Slides: Strengthening;Both;10 reps;Supine Other Exercises Other Exercises: Patient educated on and performed STS transfer with short duration ambulation to chair with RW    General Comments        Pertinent Vitals/Pain Pain Assessment: 0-10 Pain Score: 7  Pain Location: b/l hips, worse with mobilization Pain Descriptors / Indicators: Tender;Burning Pain Intervention(s): Monitored during session;Limited activity within patient's tolerance    Home Living  Prior Function            PT Goals (current goals can now be found in the care plan section) Acute Rehab PT Goals Patient Stated Goal: to return to PLOF PT Goal Formulation: With patient Time For Goal Achievement: 06/07/20 Potential to Achieve Goals: Fair Progress towards PT goals: Progressing toward goals    Frequency    Min 2X/week      PT Plan Current plan remains appropriate    Co-evaluation               AM-PAC PT "6 Clicks" Mobility   Outcome Measure  Help needed turning from your back to your side while in a flat bed without using bedrails?: A Little Help needed moving from lying on your back to sitting on the side of a flat bed without using bedrails?: A Little Help needed moving to and from a bed to a chair (including a wheelchair)?: A Little Help needed standing up from a chair using your arms (e.g., wheelchair or bedside chair)?: A Little Help needed to walk in hospital room?: Total Help needed climbing 3-5 steps with a railing? : Total 6 Click Score: 14    End of Session Equipment Utilized During Treatment: Gait belt Activity Tolerance: Patient tolerated treatment well;Patient limited by fatigue Patient left: in chair;with call bell/phone within reach;with chair alarm set Nurse Communication: Mobility status PT Visit Diagnosis: Other abnormalities of gait and mobility (R26.89);Muscle weakness (generalized) (M62.81);Pain;Unsteadiness on feet (R26.81) Pain - Right/Left: Right Pain - part of body: Hip     Time: 4235-3614 PT Time Calculation (min) (ACUTE ONLY): 17 min  Charges:  $Therapeutic Exercise: 8-22 mins            Janna Arch, PT, DPT           05/27/2020, 1:26 PM

## 2020-05-27 NOTE — Progress Notes (Signed)
Patient expresses concerns about working with PT, pt informed that he has worked with PT and there recommendations. PT notified prior to this note and are at bedside working with patient.

## 2020-05-27 NOTE — Progress Notes (Signed)
Vascular Surgery Daily Progress Note  Subjective: (05/23/20) 1. Left common femoral, profunda femoris, and superficial femoral artery endarterectomies 2. Right common femoral, profunda femoris, and superficial femoral artery endarterectomies 3.Left lower extremity angiogram including left iliofemoral angiogram 4.Stent placement to the left external iliac artery with 8 mm diameter by 6 cm length and an 8 mm diameter by 4 cm length life star stent 5.Angioplasty of the left SFA and popliteal artery with 5 mm diameter angioplasty balloon 6.Stent placement x2 to the left SFA and popliteal artery with 6 mm diameter by 25 cm length Viabahn stent and a 7 mm diameter by 15 cm length Viabahn stent 7.Right lower extremity angiogram 8.Angioplasty of the right SFA and above-knee popliteal artery with 5 mm diameter angioplasty balloon 9.Stent placement x2 to the right SFA and popliteal artery with 6 mm diameter by 25 cm length Viabahn stent and 6 mm diameter by 10 cm length Viabahn stent  Feels a little better today . Patient with some continued discomfort to the bilateral groin incisions. No issues overnight.  Antihypertensive medications held for low blood pressure.  Asymptomatic.  Working with physical therapy, does not want discharge to SNF however would like to continue to work with physical therapy while in house.  Objective: Vitals:   05/26/20 1211 05/26/20 1506 05/26/20 2329 05/27/20 0829  BP: 128/66 95/61 104/63 95/64  Pulse: 85 94 88 87  Resp: 18 20 18 17   Temp: 98.1 F (36.7 C) 99.8 F (37.7 C) 99.3 F (37.4 C) 97.9 F (36.6 C)  TempSrc: Oral Oral Oral Oral  SpO2: 95% 96% 91% 93%  Weight:      Height:        Intake/Output Summary (Last 24 hours) at 05/27/2020 05/29/2020 Last data filed at 05/27/2020 0500 Gross per 24 hour  Intake --  Output 300 ml  Net -300 ml   Physical Exam: A&Ox3, NAD CV: RRR Pulmonary: CTA Bilaterally Abdomen: Soft, Non-tender,  Non-distended Right Groin:             , clean and dry.  No swelling or drainage.  Some surrounding ecchymosis noted. Left Groin:           clean and dry.  No swelling or drainage.  Some surrounding ecchymosis noted. Vascular:             Right Lower Extremity: Thigh soft.  Calf soft.  Extremities warm distally to toes.  Good capillary refill.  Minimal edema.  Motor/sensory is intact.              Left Lower Extremity:  Thigh soft.  Calf soft.  Extremities warm distally to toes.  Good capillary refill.  Minimal edema.  Motor/sensory is intact.   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

## 2020-05-28 DIAGNOSIS — F329 Major depressive disorder, single episode, unspecified: Secondary | ICD-10-CM | POA: Insufficient documentation

## 2020-05-28 LAB — BASIC METABOLIC PANEL
Anion gap: 6 (ref 5–15)
BUN: 17 mg/dL (ref 8–23)
CO2: 31 mmol/L (ref 22–32)
Calcium: 8 mg/dL — ABNORMAL LOW (ref 8.9–10.3)
Chloride: 102 mmol/L (ref 98–111)
Creatinine, Ser: 0.82 mg/dL (ref 0.61–1.24)
GFR calc Af Amer: >60 mL/min (ref >60)
GFR calc non Af Amer: >60 mL/min (ref >60)
Glucose, Bld: 180 mg/dL — ABNORMAL HIGH (ref 70–99)
Potassium: 3.7 mmol/L (ref 3.5–5.1)
Sodium: 139 mmol/L (ref 135–145)

## 2020-05-28 LAB — CBC
HCT: 34.2 % — ABNORMAL LOW (ref 39.0–52.0)
Hemoglobin: 11.4 g/dL — ABNORMAL LOW (ref 13.0–17.0)
MCH: 30.9 pg (ref 26.0–34.0)
MCHC: 33.3 g/dL (ref 30.0–36.0)
MCV: 92.7 fL (ref 80.0–100.0)
Platelets: 229 10*3/uL (ref 150–400)
RBC: 3.69 MIL/uL — ABNORMAL LOW (ref 4.22–5.81)
RDW: 14 % (ref 11.5–15.5)
WBC: 8.9 10*3/uL (ref 4.0–10.5)
nRBC: 0 % (ref 0.0–0.2)

## 2020-05-28 LAB — MAGNESIUM: Magnesium: 2.1 mg/dL (ref 1.7–2.4)

## 2020-05-28 LAB — SARS CORONAVIRUS 2 BY RT PCR (HOSPITAL ORDER, PERFORMED IN ~~LOC~~ HOSPITAL LAB): SARS Coronavirus 2: NEGATIVE

## 2020-05-28 MED ORDER — SENNOSIDES-DOCUSATE SODIUM 8.6-50 MG PO TABS: 1.0000 | ORAL_TABLET | Freq: Every evening | ORAL | 0 refills | Status: DC | PRN

## 2020-05-28 MED ORDER — POLYETHYLENE GLYCOL 3350 17 G PO PACK
17.0000 g | PACK | ORAL | Status: AC
  Administered 2020-05-28: 17 g via ORAL
  Filled 2020-05-28: qty 1

## 2020-05-28 MED ORDER — DOCUSATE SODIUM 100 MG PO CAPS: 100.0000 mg | ORAL_CAPSULE | Freq: Two times a day (BID) | ORAL | 0 refills | Status: DC

## 2020-05-28 MED ORDER — MILK AND MOLASSES ENEMA
1.0000 | RECTAL | Status: AC
  Administered 2020-05-28: 240 mL via RECTAL
  Filled 2020-05-28: qty 240

## 2020-05-28 MED ORDER — OXYCODONE-ACETAMINOPHEN 5-325 MG PO TABS: 1.0000 | ORAL_TABLET | Freq: Four times a day (QID) | ORAL | 0 refills | Status: DC | PRN

## 2020-05-28 NOTE — TOC Progression Note (Deleted)
Transition of Care [facility]) - Progression Note    Patient Details  Name: Dennis Preston MRN: [id number] Date of Birth: 11/27/1950  [facility] CM/SW Contact  Barrie Dunker, RN Phone Number: 05/28/2020, 3:42 PM  Clinical Narrative:    Spoke with Daughter Corrie Dandy, she accepted the bed offer at [facility], I called [facility]  [phone], left a VM         Expected Discharge Plan and Services           Expected Discharge Date: 05/28/20                                     Social Determinants of Health (SDOH) Interventions    Readmission Risk Interventions No flowsheet data found.

## 2020-05-28 NOTE — Discharge Summary (Signed)
[facility]    Discharge Summary  Patient ID:  Dennis Preston MRN: [id number] DOB/AGE: 1951/07/16 69 y.o.  Admit date: 05/23/2020 Discharge date: 05/28/2020 Date of Surgery: 05/23/2020 Surgeon: Surgeon(s): Schnier, Latina Craver, MD Annice Needy, MD  Admission Diagnosis: Atherosclerosis of artery of extremity with rest pain [facility]) [I70.229]  Discharge Diagnoses:  Atherosclerosis of artery of extremity with rest pain [facility]) [I70.229]  Secondary Diagnoses: Past Medical History:  Diagnosis Date  . Alcohol abuse   . Amaurosis fugax   . Atrial fibrillation (HCC)   . Carotid stenosis   . CHF (congestive heart failure) (HCC)   . Coagulopathy (HCC)   . COPD (chronic obstructive pulmonary disease) (HCC)   . Coronary artery disease   . Depression   . Hyperlipemia   . Hypotension   . Leucocytosis   . Liver dysfunction   . Myocardial infarction (HCC)    non-STEMI. approx 2016  . Peripheral artery disease (HCC)    legs  . Tobacco abuse   . Wears dentures    full upper and lower   Patient presents with rest pain in the lower extremities and severe atherosclerotic occlusive disease.  Bilateral femoral endarterectomies are planned to try to improve perfusion.  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 There is also left iliac disease that needs addressed.  The risks and benefits as well as alternative therapies including intervention were reviewed in detail all questions were answered the patient agrees to proceed with surgery.  On 05/23/20 the patient underwent: 1. Left common femoral, profunda femoris, and superficial femoral artery endarterectomies 2.   Right common femoral, profunda femoris, and superficial femoral artery endarterectomies 3.   Left lower extremity angiogram including left iliofemoral angiogram 4.   Stent placement to the left external iliac  artery with 8 mm diameter by 6 cm length and an 8 mm diameter by 4 cm length life star stent 5.   Angioplasty of the left SFA and popliteal artery with 5 mm diameter angioplasty balloon 6.   Stent placement x2 to the left SFA and popliteal artery with 6 mm diameter by 25 cm length Viabahn stent and a 7 mm diameter by 15 cm length Viabahn stent 7.   Right lower extremity angiogram 8.   Angioplasty of the right SFA and above-knee popliteal artery with 5 mm diameter angioplasty balloon 9.   Stent placement x2 to the right SFA and popliteal artery with 6 mm diameter by 25 cm length Viabahn stent and 6 mm diameter by 10 cm length Viabahn stent  The patient tolerated the procedure well was transferred from the operating room to the recovery room without issue.  Patient needed pressure support with dopamine overnight.  This was stopped the following morning.  Patient with hypotension postop day 1, cardiology was consulted and his Sherryll Burger was held for the remainder of his inpatient stay.  The patient received occupational and physical therapy status post surgery who recommended skilled nursing facility placement at discharge.  During his brief inpatient stay, the patient's diet was advanced, his Foley was removed without issue, his pain was controlled through the use of p.o. pain medication and he was ambulating poorly with physical and occupational therapy who deemed him eligible for skilled nursing facility placement.  Day of discharge, the patient was afebrile with stable vital signs and improvement in his physical exam.  Discharged Condition: Good  Extubated: POD #  0  Physical exam:  A&Ox2, NAD CV: RRR Pulmonary: CTA Bilaterally Abdomen: Soft, Non-tender, Non-distended Right Groin: ORdressing removed, clean and dry. No swelling or drainage. Some surrounding ecchymosis noted. Left Groin: ORdressing removed, clean and dry. No swelling or drainage. Some surrounding  ecchymosis noted. Vascular: Right Lower Extremity:Thigh soft. Calf soft. Extremities warm distally to toes. Good capillary refill. Minimal edema. Motor/sensory is intact. Left Lower Extremity: Thigh soft. Calf soft. Extremities warm distally to toes. Good capillary refill. Minimal edema. Motor/sensory is intact.  Labs: As below  Complications: None  Consults:  Treatment Team:  Alwyn Pea, MD  As per cardiology, due to hypotension Entresto was discontinued.  Cardiology would like to see the patient in 1 week to assess if this medication should be started.  Significant Diagnostic Studies: CBC Lab Results  Component Value Date   WBC 8.9 05/28/2020   HGB 11.4 (L) 05/28/2020   HCT 34.2 (L) 05/28/2020   MCV 92.7 05/28/2020   PLT 229 05/28/2020   BMET    Component Value Date/Time   NA 139 05/28/2020 1029   K 3.7 05/28/2020 1029   CL 102 05/28/2020 1029   CO2 31 05/28/2020 1029   GLUCOSE 180 (H) 05/28/2020 1029   BUN 17 05/28/2020 1029   CREATININE 0.82 05/28/2020 1029   CALCIUM 8.0 (L) 05/28/2020 1029   GFRNONAA >60 05/28/2020 1029   GFRAA >60 05/28/2020 1029   COAG Lab Results  Component Value Date   INR 1.0 05/16/2020   Disposition:  Discharge to :Skilled nursing facility  Allergies as of 05/28/2020   No Known Allergies     Medication List    STOP taking these medications   HYDROcodone-homatropine 5-1.5 MG/5ML syrup Commonly known as: HYCODAN   sacubitril-valsartan 24-26 MG Commonly known as: ENTRESTO     TAKE these medications   acetaminophen 325 MG tablet Commonly known as: TYLENOL Take 650 mg by mouth every 6 (six) hours as needed for mild pain or headache.   albuterol 108 (90 Base) MCG/ACT inhaler Commonly known as: VENTOLIN HFA Inhale 1-2 puffs into the lungs every 6 (six) hours as needed for wheezing or shortness of breath.   albuterol (2.5 MG/3ML) 0.083% nebulizer solution Commonly known as:  PROVENTIL Take 3 mLs (2.5 mg total) by nebulization every 6 (six) hours as needed for wheezing or shortness of breath.   aspirin EC 81 MG tablet Take 1 tablet (81 mg total) by mouth daily. Swallow whole.   atorvastatin 80 MG tablet Commonly known as: LIPITOR Take 1 tablet (80 mg total) by mouth daily at 6 PM.   clopidogrel 75 MG tablet Commonly known as: PLAVIX Take 75 mg by mouth every evening.   docusate sodium 100 MG capsule Commonly known as: COLACE Take 1 capsule (100 mg total) by mouth 2 (two) times daily.   furosemide 40 MG tablet Commonly known as: LASIX Take 40 mg by mouth daily.   oxyCODONE-acetaminophen 5-325 MG tablet Commonly known as: PERCOCET/ROXICET Take 1-2 tablets by mouth every 6 (six) hours as needed for moderate pain or severe pain.   senna-docusate 8.6-50 MG tablet Commonly known as: Senokot-S Take 1 tablet by mouth at bedtime as needed for mild constipation.   Trelegy Ellipta 100-62.5-25 MCG/INH Aepb Generic drug: Fluticasone-Umeclidin-Vilant Inhale 1 puff into the lungs every other day.      Verbal and written Discharge instructions given to the patient. Wound care per Discharge AVS  Contact information for follow-up providers    Schnier, Latina Craver, MD  Follow up in 1 week(s).   Specialties: Vascular Surgery, Cardiology, Radiology, Vascular Surgery Why: Can see Vivia Birmingham.  First postoperative follow-up.  No studies needed. Contact information: [address] [place] [address] [phone]        Alwyn Pea, MD Follow up in 1 week(s).   Specialties: Cardiology, Internal Medicine Why: Patient of Dr. Juliann Pares. Medication management. Antihypertensive medications were stopped during inpatient stay.  Patient must be seen in one week to determine if these medication should be restarted. Contact information: [address] [place] [address] [phone]            Contact information for after-discharge care     Destination    [facility] .   Service: Skilled Nursing Contact information: [address] [place] [address] [phone]                 Signed: Tonette Lederer, PA-C  05/28/2020, 11:42 AM

## 2020-05-28 NOTE — Progress Notes (Signed)
Occupational Therapy Treatment Patient Details Name: JAQUEZ FARRINGTON MRN: [id number] DOB: March 04, 1951 Today's Date: 05/28/2020    History of present illness Pt is a 69 yo male that underwent bilateral femoral endarterectomy and stenting. PMH of COPD, CAD, past MI, PVD, CHF, anxiety, depression.   OT comments  Mr Montoya was seen for OT treatment on this date. Upon arrival to room pt reclined in bed, wife present at bedside. Pt again reports that he has not had therapy this admission - educated pt/family on therapy in acute setting and re-oriented to recent therapy sessions. Pt requesting to get to chair this session. Required MAX A for LBD at bed level - endorses difficulty at baseline. MIN A exit bed R side. MIN A + RW for ADL t/f bed>chair. Pt endorses dizziness in sitting EOB, resolved c seated rest. Following SPT pt again reports dizziness, vitals taken and stable in sitting: BP 126/64, MAP 83, HR 77, SpO2 91% on RA. SETUP grooming tasks seated EOC. Pt making good progress toward goals. Pt continues to benefit from skilled OT services to maximize return to PLOF and minimize risk of future falls, injury, caregiver burden, and readmission. Will continue to follow POC. Discharge recommendation remains appropriate.    Follow Up Recommendations  SNF    Equipment Recommendations   (defer to next venue)    Recommendations for Other Services      Precautions / Restrictions Precautions Precautions: Fall Precaution Comments: watch vitals Restrictions Weight Bearing Restrictions: No       Mobility Bed Mobility Overal bed mobility: Needs Assistance Bed Mobility: Supine to Sit     Supine to sit: Min assist;HOB elevated        Transfers Overall transfer level: Needs assistance Equipment used: Rolling walker (2 wheeled) Transfers: Sit to/from Stand Sit to Stand: Min assist;From elevated surface          Balance Overall balance assessment: Needs assistance Sitting-balance  support: Feet supported Sitting balance-Leahy Scale: Good     Standing balance support: Bilateral upper extremity supported Standing balance-Leahy Scale: Poor Standing balance comment: reliant on UE support on RW        ADL either performed or assessed with clinical judgement   ADL Overall ADL's : Needs assistance/impaired      General ADL Comments: MAX A for LBD at bed level - endorses difficulty at baseline. MIN A + RW for ADL t/f bed>chair. SETUP grooming tasks seated EOC      Cognition Arousal/Alertness: Awake/alert Behavior During Therapy: WFL for tasks assessed/performed Overall Cognitive Status: Within Functional Limits for tasks assessed           Exercises Exercises: Other exercises Other Exercises Other Exercises: Pt educated re: OT role, DME recs, importance of mobility for funcitonal strengthening, IS (frequency and use) Other Exercises: LBD, grooming, sup>sit, sit<>stand, sitting/standing balance/tolerance   General Comments Pt reports dizziness in sitting - reports resolved c seated rest. SPT completed to chair, pt reports dizziness returned. Vitals stable in sitting: BP 126/64, MAP 83, HR 77, SpO2 91% on RA    Pertinent Vitals/ Pain       Pain Assessment: No/denies pain   Frequency  Min 1X/week        Progress Toward Goals  OT Goals(current goals can now be found in the care plan section)  Progress towards OT goals: Progressing toward goals  Acute Rehab OT Goals Patient Stated Goal: to return to PLOF OT Goal Formulation: With patient Time For Goal Achievement: 06/08/20 Potential to Achieve  Goals: Good ADL Goals Pt Will Perform Upper Body Dressing: with set-up;sitting Pt Will Perform Lower Body Dressing: with min assist;sit to/from stand (with AE PRN for EC) Pt Will Transfer to Toilet: with min assist;stand pivot transfer;bedside commode Pt Will Perform Toileting - Clothing Manipulation and hygiene: with min assist;sit to/from  stand Pt/caregiver will Perform Home Exercise Program: Increased strength;Both right and left upper extremity;With Supervision Additional ADL Goal #1: Pt will verbalize how to implement EC strategies into 3 aspects of ADL routine with 0% verbal cues.  Plan Discharge plan remains appropriate;Frequency remains appropriate    AM-PAC OT "6 Clicks" Daily Activity     Outcome Measure   Help from another person eating meals?: None Help from another person taking care of personal grooming?: A Little Help from another person toileting, which includes using toliet, bedpan, or urinal?: A Lot Help from another person bathing (including washing, rinsing, drying)?: A Lot Help from another person to put on and taking off regular upper body clothing?: A Little Help from another person to put on and taking off regular lower body clothing?: A Lot 6 Click Score: 16    End of Session Equipment Utilized During Treatment: Rolling walker  OT Visit Diagnosis: Muscle weakness (generalized) (M62.81)   Activity Tolerance Patient tolerated treatment well   Patient Left in chair;with call bell/phone within reach;with chair alarm set;with family/visitor present   Nurse Communication          Time: 3419-6222 OT Time Calculation (min): 20 min  Charges: OT General Charges $OT Visit: 1 Visit OT Treatments $Self Care/Home Management : 8-22 mins  Kathie Dike, M.S. OTR/L  05/28/20, 11:06 AM  ascom [phone]

## 2020-05-28 NOTE — TOC Progression Note (Signed)
Transition of Care [facility]) - Progression Note    Patient Details  Name: Dennis Preston MRN: [id number] Date of Birth: [birth year]  Transition of [facility]) CM/SW Contact  Barrie Dunker, RN Phone Number: 05/28/2020, 4:03 PM  Clinical Narrative:   Called EMS to transport to [facility], The bedside nurse is aware, patient is 4th on the list         Expected Discharge Plan and Services           Expected Discharge Date: 05/28/20                                     Social Determinants of Health (SDOH) Interventions    Readmission Risk Interventions No flowsheet data found.

## 2020-05-28 NOTE — Care Management Important Message (Signed)
Important Message  Patient Details  Name: Dennis Preston MRN: [id number] Date of Birth: 11-02-1950   Medicare Important Message Given:  Yes     [name] 05/28/2020, 1:15 PM

## 2020-05-28 NOTE — Progress Notes (Signed)
Pt was discharged at 1830. Pt was transported  to [facility] facility by EMS. PIV  access was removed by another nurse. Pt was transported with belongings.

## 2020-05-28 NOTE — Progress Notes (Signed)
Just gave report to Sue Lush at [facility]. Transport has been requested at this time. Pt is awaiting. Wife is at bedside.

## 2020-05-28 NOTE — TOC Progression Note (Signed)
Transition of Care [facility]) - Progression Note    Patient Details  Name: Dennis Preston MRN: [id number] Date of Birth: 1950/12/28  Transition of Care [facility]) CM/SW Contact  Barrie Dunker, RN Phone Number: 05/28/2020, 1:14 PM  Clinical Narrative:    DC packet placed on the chart, the patient will be ready once the Rapid Covid results are back, the bedside nurse is aware       Expected Discharge Plan and Services           Expected Discharge Date: 05/28/20                                     Social Determinants of Health (SDOH) Interventions    Readmission Risk Interventions No flowsheet data found.

## 2020-06-05 ENCOUNTER — Encounter (INDEPENDENT_AMBULATORY_CARE_PROVIDER_SITE_OTHER): Payer: Self-pay | Admitting: Nurse Practitioner

## 2020-06-05 ENCOUNTER — Ambulatory Visit (INDEPENDENT_AMBULATORY_CARE_PROVIDER_SITE_OTHER): Payer: Medicare Other | Admitting: Nurse Practitioner

## 2020-06-05 ENCOUNTER — Other Ambulatory Visit: Payer: Self-pay

## 2020-06-05 VITALS — BP 153/60 | HR 69 | Resp 16 | Ht 65.0 in | Wt 170.0 lb

## 2020-06-05 DIAGNOSIS — I70229 Atherosclerosis of native arteries of extremities with rest pain, unspecified extremity: Secondary | ICD-10-CM

## 2020-06-05 DIAGNOSIS — I6523 Occlusion and stenosis of bilateral carotid arteries: Secondary | ICD-10-CM

## 2020-06-05 NOTE — Progress Notes (Signed)
Subjective:    Patient ID: Dennis Preston, male    DOB: 01-07-1951, 69 y.o.   MRN: [id number] Chief Complaint  Patient presents with  . Follow-up    1 wk post fem endarterectomy    Patient presents today 1 week post femoral endarterectomy.  The patient underwent a bilateral femoral endarterectomy, including:  PROCEDURE: 1. Bilateral common femoral, superficial femoral and profunda femoris endarterectomy with Cormatrix patch angioplasty. 2. Left lower extremity angiography both antegrade and retrograde left common femoral approach. 3. Open transluminal angioplasty and stent placement left SFA and popliteal artery postdilated to 6 mm. 4. Open transluminal angioplasty and stent placement left external iliac artery to 7 mm in diameter. 5. Right lower extremity angiography antegrade from the right common femoral approach. 6. Open transluminal angioplasty and stent placement right SFA and popliteal  The patient has been at a rehab facility and slowly regaining his strength. The patient notes the rest pain is resolved but isn't entirely certain about the claudication.  The wounds are clean dry and intact with a bit more selling in the left vs. Right.  No imminent signs and symptoms of infection.  Overall, the patient is doing well.    Review of Systems  Cardiovascular: Positive for leg swelling.  Musculoskeletal: Positive for gait problem.  Skin: Positive for wound.  All other systems reviewed and are negative.      Objective:   Physical Exam Vitals reviewed.  HENT:     Head: Normocephalic.  Cardiovascular:     Rate and Rhythm: Normal rate and regular rhythm.     Pulses: Normal pulses.          Dorsalis pedis pulses are 2+ on the right side and 2+ on the left side.       Posterior tibial pulses are 2+ on the right side and 2+ on the left side.  Pulmonary:     Effort: Pulmonary effort is normal.  Musculoskeletal:     Right lower leg: Edema present.     Left lower leg: Edema  present.  Neurological:     Mental Status: He is alert and oriented to person, place, and time.     Motor: Weakness present.     Gait: Gait abnormal.  Psychiatric:        Mood and Affect: Mood normal.        Behavior: Behavior normal.        Thought Content: Thought content normal.        Judgment: Judgment normal.     BP (!) 153/60 (BP Location: Right Arm)   Pulse 69   Resp 16   Ht 5\' 5"  (1.651 m)   Wt 170 lb (77.1 kg)   BMI 28.29 kg/m   Past Medical History:  Diagnosis Date  . Alcohol abuse   . Amaurosis fugax   . Atrial fibrillation (HCC)   . Carotid stenosis   . CHF (congestive heart failure) (HCC)   . Coagulopathy (HCC)   . COPD (chronic obstructive pulmonary disease) (HCC)   . Coronary artery disease   . Depression   . Hyperlipemia   . Hypotension   . Leucocytosis   . Liver dysfunction   . Myocardial infarction (HCC)    non-STEMI. approx 2016  . Peripheral artery disease (HCC)    legs  . Tobacco abuse   . Wears dentures    full upper and lower    Social History   Socioeconomic History  . Marital status: Married  Spouse name: Not on file  . Number of children: Not on file  . Years of education: Not on file  . Highest education level: Not on file  Occupational History  . Not on file  Tobacco Use  . Smoking status: Former Smoker    Packs/day: 1.00    Years: 54.00    Pack years: 54.00    Types: Cigarettes    Quit date: 03/16/2020    Years since quitting: 0.2  . Smokeless tobacco: Never Used  . Tobacco comment: was 2.5 PPD for 50 yrs until 2016, quit a "few days ago"  Vaping Use  . Vaping Use: Never used  Substance and Sexual Activity  . Alcohol use: Not Currently    Comment: quit over 15 yrs ago  . Drug use: Not Currently  . Sexual activity: Not on file  Other Topics Concern  . Not on file  Social History Narrative  . Not on file   Social Determinants of Health   Financial Resource Strain:   . Difficulty of Paying Living Expenses: Not  on file  Food Insecurity:   . Worried About Programme researcher, broadcasting/film/video in the Last Year: Not on file  . Ran Out of Food in the Last Year: Not on file  Transportation Needs:   . Lack of Transportation (Medical): Not on file  . Lack of Transportation (Non-Medical): Not on file  Physical Activity:   . Days of Exercise per Week: Not on file  . Minutes of Exercise per Session: Not on file  Stress:   . Feeling of Stress : Not on file  Social Connections:   . Frequency of Communication with Friends and Family: Not on file  . Frequency of Social Gatherings with Friends and Family: Not on file  . Attends Religious Services: Not on file  . Active Member of Clubs or Organizations: Not on file  . Attends Banker Meetings: Not on file  . Marital Status: Not on file  Intimate Partner Violence:   . Fear of Current or Ex-Partner: Not on file  . Emotionally Abused: Not on file  . Physically Abused: Not on file  . Sexually Abused: Not on file    Past Surgical History:  Procedure Laterality Date  . CARDIAC CATHETERIZATION N/A 07/10/2015   Procedure: Right/Left Heart Cath and Coronary Angiography;  Surgeon: Alwyn Pea, MD;  Location: [facility] INVASIVE CV LAB;  Service: Cardiovascular;  Laterality: N/A;  . CORONARY ARTERY BYPASS GRAFT  07/13/2015   [facility].  4 vessel  . ENDARTERECTOMY FEMORAL Bilateral 05/23/2020   Procedure: ENDARTERECTOMY FEMORAL BILATERAL SUPERFICIAL FEMORAL ARTERY STENTS ;  Surgeon: Renford Dills, MD;  Location: [facility] ORS;  Service: Vascular;  Laterality: Bilateral;  . INSERTION OF ILIAC STENT Left 05/23/2020   Procedure: INSERTION OF ILIAC STENT;  Surgeon: Renford Dills, MD;  Location: [facility] ORS;  Service: Vascular;  Laterality: Left;  . LEFT HEART CATH AND CORS/GRAFTS ANGIOGRAPHY N/A 01/23/2020   Procedure: LEFT HEART CATH AND CORS/GRAFTS ANGIOGRAPHY;  Surgeon: Alwyn Pea, MD;  Location: [facility] INVASIVE CV LAB;  Service: Cardiovascular;  Laterality: N/A;  .  LOWER EXTREMITY ANGIOGRAPHY Right 04/03/2020   Procedure: LOWER EXTREMITY ANGIOGRAPHY;  Surgeon: Renford Dills, MD;  Location: [facility] INVASIVE CV LAB;  Service: Cardiovascular;  Laterality: Right;  . OLECRANON BURSECTOMY Left 05/06/2019   Procedure: OLECRANON BURSECTOMY AND DEBRIDEMENT;  Surgeon: Signa Kell, MD;  Location: [facility] ORS;  Service: Orthopedics;  Laterality: Left;    Family History  Problem Relation Age of Onset  . Depression Sister     No Known Allergies     Assessment & Plan:   1. Atherosclerosis of artery of extremity with rest pain (HCC) Overall the patient is doing well.  The patient notes that he is not walking extensively so is not able to quantify his claudication pain however he does note that the rest pain seems to be much better.  He still has some discomfort with that his groin areas.  But this is not uncommon.  The left groin may have some hematoma present due to the hardness under the skin.  However both groin sites are clean dry and intact with no signs of dehiscence.  Overall the patient is doing well.  We will have him return into the office in 4 to 6 weeks for noninvasive studies.  2. Carotid atherosclerosis, bilateral The patient does have known high-grade carotid stenosis.  However we will allow the patient to heal from his bilateral femoral endarterectomy before proceeding with carotid endarterectomy.  We anticipate it may be 10 to 12 weeks before carotid intervention.   Current Outpatient Medications on File Prior to Visit  Medication Sig Dispense Refill  . acetaminophen (TYLENOL) 325 MG tablet Take 650 mg by mouth every 6 (six) hours as needed for mild pain or headache.     . albuterol (PROVENTIL) (2.5 MG/3ML) 0.083% nebulizer solution Take 3 mLs (2.5 mg total) by nebulization every 6 (six) hours as needed for wheezing or shortness of breath. 360 mL 0  . albuterol (VENTOLIN HFA) 108 (90 Base) MCG/ACT inhaler Inhale 1-2 puffs into the lungs every 6 (six)  hours as needed for wheezing or shortness of breath. 8 g 0  . atorvastatin (LIPITOR) 80 MG tablet Take 1 tablet (80 mg total) by mouth daily at 6 PM.    . clopidogrel (PLAVIX) 75 MG tablet Take 75 mg by mouth every evening.     . docusate sodium (COLACE) 100 MG capsule Take 1 capsule (100 mg total) by mouth 2 (two) times daily. 60 capsule 0  . Fluticasone-Umeclidin-Vilant (TRELEGY ELLIPTA) 100-62.5-25 MCG/INH AEPB Inhale 1 puff into the lungs every other day.     . furosemide (LASIX) 40 MG tablet Take 40 mg by mouth daily.     Marland Kitchen oxyCODONE-acetaminophen (PERCOCET/ROXICET) 5-325 MG tablet Take 1-2 tablets by mouth every 6 (six) hours as needed for moderate pain or severe pain. 28 tablet 0  . senna-docusate (SENOKOT-S) 8.6-50 MG tablet Take 1 tablet by mouth at bedtime as needed for mild constipation. 30 tablet 0  . aspirin EC 81 MG tablet Take 1 tablet (81 mg total) by mouth daily. Swallow whole. (Patient not taking: Reported on 05/16/2020) 150 tablet 1   No current facility-administered medications on file prior to visit.    There are no Patient Instructions on file for this visit. No follow-ups on file.   Georgiana Spinner, NP

## 2020-06-08 ENCOUNTER — Other Ambulatory Visit (INDEPENDENT_AMBULATORY_CARE_PROVIDER_SITE_OTHER): Payer: Self-pay | Admitting: Nurse Practitioner

## 2020-06-08 DIAGNOSIS — I70229 Atherosclerosis of native arteries of extremities with rest pain, unspecified extremity: Secondary | ICD-10-CM

## 2020-06-11 ENCOUNTER — Other Ambulatory Visit (INDEPENDENT_AMBULATORY_CARE_PROVIDER_SITE_OTHER): Payer: Self-pay | Admitting: Nurse Practitioner

## 2020-06-11 ENCOUNTER — Telehealth (INDEPENDENT_AMBULATORY_CARE_PROVIDER_SITE_OTHER): Payer: Self-pay

## 2020-06-11 MED ORDER — AMOXICILLIN-POT CLAVULANATE 875-125 MG PO TABS: 1.0000 | ORAL_TABLET | Freq: Two times a day (BID) | ORAL | 0 refills | Status: DC

## 2020-06-11 NOTE — Telephone Encounter (Signed)
I called lindsey of [facility] and made her aware of the NP instructions.

## 2020-06-11 NOTE — Telephone Encounter (Signed)
I sent some Augmentin into his pharmacy.  He should start taking that and have the nurse apply aquacell Ag three times a week to the open area.  If the redness doesn't decrease within the next few days after taking abx we may have him come in so we can look

## 2020-06-11 NOTE — Telephone Encounter (Signed)
PT from [facility] called and left a VM saying the pt is having redness and some drainage and and increased pain at the incision site of angio plasty on the right groin I called the PT back and made her aware of the NP's instructions

## 2020-06-11 NOTE — Telephone Encounter (Signed)
Home care nurse Mardella Layman from [facility] called and left a message on the nurses line I returned her call and she made me aware that the pt had a previous angio plasty done on 8/25 by schnier  And now the right groin incision is really red and has yellow slough coming from it and has a small opening. The pt has not history of blood clots the incision is warm to the touch  And really red in color. Please advise.

## 2020-06-14 ENCOUNTER — Telehealth (INDEPENDENT_AMBULATORY_CARE_PROVIDER_SITE_OTHER): Payer: Self-pay

## 2020-06-14 NOTE — Telephone Encounter (Signed)
A message was left regarding the patient have a right groin wound that is closed with redness and the concern for infection and complaint of pain. Per the message on 06/11/20 the patient was given Augmentin antibiotics to take and has Oxycodone 5-325 mg for pain as well. I returned the call to Elease Hashimoto and let her know that the patient was given an antibiotic and asked if they were taking the medication she stated she thought they were. I asked if they were taking the Oxycodone for pain and she stated not as he should and the patient stated it wasn't working. Elease Hashimoto stated the patient has some slough on the wound also Per Sheppard Plumber NP patient has a hematoma in the right groin area and it was explained to the patient that it will cause some pain, he is on the antibiotic because of this as well, Aquacel is to be used on the wound and placed after cleaning and if the wound gets worse to call our office.

## 2020-06-19 ENCOUNTER — Telehealth (INDEPENDENT_AMBULATORY_CARE_PROVIDER_SITE_OTHER): Payer: Self-pay | Admitting: Vascular Surgery

## 2020-06-19 NOTE — Telephone Encounter (Signed)
Made patient aware of NP's note and scheduled him for tomorrow (06-20-20).

## 2020-06-19 NOTE — Telephone Encounter (Signed)
We can see him tomorrow or Thursday.. me or gs but if he begins to feel exceptionally worse with fever chills, decreased consciousness, he should present to the ED

## 2020-06-19 NOTE — Telephone Encounter (Signed)
Called stating there is a lot of drainage and he feels worse, he also stated that his condition he feels is worsening. Patient was last seen 06-05-2020 ; 1 week post op Endarterectomy Femoral (Bil) Superficial Femoral Artery Stent with FB. Enrique Sack from [facility] called asking if he could be seen today with patient on the line. I advised her that the NP and his provider are not in clinic today, please advise.

## 2020-06-20 ENCOUNTER — Encounter (INDEPENDENT_AMBULATORY_CARE_PROVIDER_SITE_OTHER): Payer: Self-pay | Admitting: Nurse Practitioner

## 2020-06-20 ENCOUNTER — Other Ambulatory Visit
Admission: RE | Admit: 2020-06-20 | Discharge: 2020-06-20 | Disposition: A | Discharge status: Home / Self Care | Payer: Medicare Other | Source: Ambulatory Visit | Attending: Vascular Surgery | Admitting: Vascular Surgery

## 2020-06-20 ENCOUNTER — Other Ambulatory Visit: Payer: Self-pay

## 2020-06-20 ENCOUNTER — Ambulatory Visit (INDEPENDENT_AMBULATORY_CARE_PROVIDER_SITE_OTHER): Payer: Medicare Other | Admitting: Nurse Practitioner

## 2020-06-20 VITALS — BP 161/60 | HR 79 | Resp 16 | Wt 165.4 lb

## 2020-06-20 DIAGNOSIS — Z01812 Encounter for preprocedural laboratory examination: Secondary | ICD-10-CM | POA: Insufficient documentation

## 2020-06-20 DIAGNOSIS — I70229 Atherosclerosis of native arteries of extremities with rest pain, unspecified extremity: Secondary | ICD-10-CM

## 2020-06-20 DIAGNOSIS — Z20822 Contact with and (suspected) exposure to covid-19: Secondary | ICD-10-CM | POA: Diagnosis not present

## 2020-06-20 DIAGNOSIS — T8149XA Infection following a procedure, other surgical site, initial encounter: Secondary | ICD-10-CM

## 2020-06-20 DIAGNOSIS — Z720 Tobacco use: Secondary | ICD-10-CM

## 2020-06-21 ENCOUNTER — Encounter
Admission: RE | Admit: 2020-06-21 | Discharge: 2020-06-21 | Disposition: A | Discharge status: Home / Self Care | Payer: Medicare Other | Source: Ambulatory Visit | Attending: Vascular Surgery | Admitting: Vascular Surgery

## 2020-06-21 ENCOUNTER — Encounter (INDEPENDENT_AMBULATORY_CARE_PROVIDER_SITE_OTHER): Payer: Self-pay | Admitting: Nurse Practitioner

## 2020-06-21 ENCOUNTER — Other Ambulatory Visit (INDEPENDENT_AMBULATORY_CARE_PROVIDER_SITE_OTHER): Payer: Self-pay | Admitting: Nurse Practitioner

## 2020-06-21 LAB — SARS CORONAVIRUS 2 (TAT 6-24 HRS): SARS Coronavirus 2: NEGATIVE

## 2020-06-21 NOTE — Progress Notes (Signed)
 Subjective:    Patient ID: Dennis Preston, male    DOB: 03/06/1951, 69 y.o.   MRN: [id number] Chief Complaint  Patient presents with  . Follow-up    right groin infection    The patient presents today for wound evaluation after bilateral femoral endarterectomy on 05/23/2020.  We were contacted by the patient's home health facility noting that the patient had a open draining wound with signs and symptoms of infection.  Today the patient's wife notes that she was told that the office was sent pictures of the patient's wound however the patient does not have my chart and apparently there is no evidence of any pictures that were transmitted to our office.  Previous notifications about the patient's wound included increasing redness however no evidence of dehiscence was noted.  On initial postoperative visit on 06/05/2020, the patient had some swelling of bilateral lower extremities but both wounds were clean dry and intact with no dehiscence.  The patient has slightly more swelling on the left versus the right at that time.  There were no signs symptoms of infection.  Today, the patient notes that his left lower extremity feels much better and the wound is essentially healed on that side.  However the right is still very uncomfortable and it makes it hard for him to sleep.  Further complicating matters is the reluctance of the patient's spouse to give him prescribed pain medicine for fear of addiction.  The patient notes that 1 evening he ran a temperature of 102 however this was approximately 2 weeks ago.  He denies any recent fever, chills, nausea, vomiting or diarrhea.  The patient does try to be active however it is difficult due to the open wound.  Currently the patient is not diabetic and uses a standard diet.  Wound measures approximately 2 inches x 1.5 inches.  It appears dry with no copious drainage at the moment.   Review of Systems  Skin: Positive for wound.  Hematological: Bruises/bleeds  easily.  All other systems reviewed and are negative.      Objective:   Physical Exam Vitals reviewed.  HENT:     Head: Normocephalic.  Cardiovascular:     Rate and Rhythm: Normal rate.     Pulses: Normal pulses.  Pulmonary:     Effort: Pulmonary effort is normal.  Skin:    General: Skin is warm and dry.       Neurological:     Mental Status: He is alert and oriented to person, place, and time.     Motor: Weakness present.     Gait: Gait abnormal.  Psychiatric:        Mood and Affect: Mood normal.        Behavior: Behavior normal.        Thought Content: Thought content normal.        Judgment: Judgment normal.     BP (!) 161/60 (BP Location: Right Arm)   Pulse 79   Resp 16   Wt 165 lb 6.4 oz (75 kg)   BMI 27.52 kg/m   Past Medical History:  Diagnosis Date  . Alcohol abuse   . Amaurosis fugax   . Atrial fibrillation (HCC)   . Carotid stenosis   . CHF (congestive heart failure) (HCC)   . Coagulopathy (HCC)   . COPD (chronic obstructive pulmonary disease) (HCC)   . Coronary artery disease   . Depression   . Hyperlipemia   . Hypotension   . Leucocytosis   .   Liver dysfunction   . Myocardial infarction (HCC)    non-STEMI. approx 2016  . Peripheral artery disease (HCC)    legs  . Tobacco abuse   . Wears dentures    full upper and lower    Social History   Socioeconomic History  . Marital status: Married    Spouse name: Not on file  . Number of children: Not on file  . Years of education: Not on file  . Highest education level: Not on file  Occupational History  . Not on file  Tobacco Use  . Smoking status: Former Smoker    Packs/day: 1.00    Years: 54.00    Pack years: 54.00    Types: Cigarettes    Quit date: 03/16/2020    Years since quitting: 0.2  . Smokeless tobacco: Never Used  . Tobacco comment: was 2.5 PPD for 50 yrs until 2016, quit a "few days ago"  Vaping Use  . Vaping Use: Never used  Substance and Sexual Activity  . Alcohol use:  Not Currently    Comment: quit over 15 yrs ago  . Drug use: Not Currently  . Sexual activity: Not on file  Other Topics Concern  . Not on file  Social History Narrative  . Not on file   Social Determinants of Health   Financial Resource Strain:   . Difficulty of Paying Living Expenses: Not on file  Food Insecurity:   . Worried About Running Out of Food in the Last Year: Not on file  . Ran Out of Food in the Last Year: Not on file  Transportation Needs:   . Lack of Transportation (Medical): Not on file  . Lack of Transportation (Non-Medical): Not on file  Physical Activity:   . Days of Exercise per Week: Not on file  . Minutes of Exercise per Session: Not on file  Stress:   . Feeling of Stress : Not on file  Social Connections:   . Frequency of Communication with Friends and Family: Not on file  . Frequency of Social Gatherings with Friends and Family: Not on file  . Attends Religious Services: Not on file  . Active Member of Clubs or Organizations: Not on file  . Attends Club or Organization Meetings: Not on file  . Marital Status: Not on file  Intimate Partner Violence:   . Fear of Current or Ex-Partner: Not on file  . Emotionally Abused: Not on file  . Physically Abused: Not on file  . Sexually Abused: Not on file    Past Surgical History:  Procedure Laterality Date  . CARDIAC CATHETERIZATION N/A 07/10/2015   Procedure: Right/Left Heart Cath and Coronary Angiography;  Surgeon: Dwayne D Callwood, MD;  Location: [facility];  Service: Cardiovascular;  Laterality: N/A;  . CORONARY ARTERY BYPASS GRAFT  07/13/2015   [facility].  4 vessel  . ENDARTERECTOMY FEMORAL Bilateral 05/23/2020   Procedure: ENDARTERECTOMY FEMORAL BILATERAL SUPERFICIAL FEMORAL ARTERY STENTS ;  Surgeon: Schnier, Gregory G, MD;  Location: [facility];  Service: Vascular;  Laterality: Bilateral;  . INSERTION OF ILIAC STENT Left 05/23/2020   Procedure: INSERTION OF ILIAC STENT;  Surgeon: Schnier, Gregory G,  MD;  Location: [facility];  Service: Vascular;  Laterality: Left;  . LEFT HEART CATH AND CORS/GRAFTS ANGIOGRAPHY N/A 01/23/2020   Procedure: LEFT HEART CATH AND CORS/GRAFTS ANGIOGRAPHY;  Surgeon: Callwood, Dwayne D, MD;  Location: [facility];  Service: Cardiovascular;  Laterality: N/A;  . LOWER EXTREMITY ANGIOGRAPHY Right 04/03/2020     Procedure: LOWER EXTREMITY ANGIOGRAPHY;  Surgeon: Schnier, Gregory G, MD;  Location: [facility];  Service: Cardiovascular;  Laterality: Right;  . OLECRANON BURSECTOMY Left 05/06/2019   Procedure: OLECRANON BURSECTOMY AND DEBRIDEMENT;  Surgeon: Patel, Sunny, MD;  Location: [facility];  Service: Orthopedics;  Laterality: Left;    Family History  Problem Relation Age of Onset  . Depression Sister     No Known Allergies     Assessment & Plan:   1. Atherosclerosis of artery of extremity with rest pain (HCC) Besides the postoperative wound infection, PE patient's left-sided wound is healing fine.  The patient notes that walking with a left lower extremity is much improved from baseline.  He is unable to tell with the right lower extremity due to the pain in the groin area.  The swelling has greatly improved in his bilateral lower extremities.  Currently the patient has adequate perfusion.  2. Tobacco use Smoking cessation was discussed, 3-10 minutes spent on this topic specifically   3. Postoperative wound infection The patient has a large wound dehiscence in the right groin with extensive slough that has dried in the wound area.  Wound skin changes present today it is noted by the wife and home health complaints that the patient has extensive drainage coming from the wound.  Based on this the most appropriate course of action would be debridement with wound VAC placement.  We will schedule this as soon as possible to expedite wound healing.   Current Outpatient Medications on File Prior to Visit  Medication Sig Dispense Refill  . acetaminophen  (TYLENOL) 325 MG tablet Take 650 mg by mouth every 6 (six) hours as needed for mild pain or headache.     . albuterol (PROVENTIL) (2.5 MG/3ML) 0.083% nebulizer solution Take 3 mLs (2.5 mg total) by nebulization every 6 (six) hours as needed for wheezing or shortness of breath. 360 mL 0  . albuterol (VENTOLIN HFA) 108 (90 Base) MCG/ACT inhaler Inhale 1-2 puffs into the lungs every 6 (six) hours as needed for wheezing or shortness of breath. 8 g 0  . atorvastatin (LIPITOR) 80 MG tablet Take 1 tablet (80 mg total) by mouth daily at 6 PM.    . clopidogrel (PLAVIX) 75 MG tablet Take 75 mg by mouth every evening.     . docusate sodium (COLACE) 100 MG capsule Take 1 capsule (100 mg total) by mouth 2 (two) times daily. 60 capsule 0  . Fluticasone-Umeclidin-Vilant (TRELEGY ELLIPTA) 100-62.5-25 MCG/INH AEPB Inhale 1 puff into the lungs every other day.     . furosemide (LASIX) 40 MG tablet Take 40 mg by mouth daily.     . oxyCODONE-acetaminophen (PERCOCET/ROXICET) 5-325 MG tablet Take 1-2 tablets by mouth every 6 (six) hours as needed for moderate pain or severe pain. 28 tablet 0  . senna-docusate (SENOKOT-S) 8.6-50 MG tablet Take 1 tablet by mouth at bedtime as needed for mild constipation. 30 tablet 0  . amoxicillin-clavulanate (AUGMENTIN) 875-125 MG tablet Take 1 tablet by mouth 2 (two) times daily. (Patient not taking: Reported on 06/20/2020) 14 tablet 0  . aspirin EC 81 MG tablet Take 1 tablet (81 mg total) by mouth daily. Swallow whole. (Patient not taking: Reported on 05/16/2020) 150 tablet 1   No current facility-administered medications on file prior to visit.    There are no Patient Instructions on file for this visit. No follow-ups on file.   Lottie Siska E Aydyn Testerman, NP   

## 2020-06-21 NOTE — H&P (View-Only) (Signed)
Subjective:    Patient ID: Dennis Preston, male    DOB: 04-Feb-1951, 69 y.o.   MRN: [id number] Chief Complaint  Patient presents with  . Follow-up    right groin infection    The patient presents today for wound evaluation after bilateral femoral endarterectomy on 05/23/2020.  We were contacted by the patient's home health facility noting that the patient had a open draining wound with signs and symptoms of infection.  Today the patient's wife notes that she was told that the office was sent pictures of the patient's wound however the patient does not have my chart and apparently there is no evidence of any pictures that were transmitted to our office.  Previous notifications about the patient's wound included increasing redness however no evidence of dehiscence was noted.  On initial postoperative visit on 06/05/2020, the patient had some swelling of bilateral lower extremities but both wounds were clean dry and intact with no dehiscence.  The patient has slightly more swelling on the left versus the right at that time.  There were no signs symptoms of infection.  Today, the patient notes that his left lower extremity feels much better and the wound is essentially healed on that side.  However the right is still very uncomfortable and it makes it hard for him to sleep.  Further complicating matters is the reluctance of the patient's spouse to give him prescribed pain medicine for fear of addiction.  The patient notes that 1 evening he ran a temperature of 102 however this was approximately 2 weeks ago.  He denies any recent fever, chills, nausea, vomiting or diarrhea.  The patient does try to be active however it is difficult due to the open wound.  Currently the patient is not diabetic and uses a standard diet.  Wound measures approximately 2 inches x 1.5 inches.  It appears dry with no copious drainage at the moment.   Review of Systems  Skin: Positive for wound.  Hematological: Bruises/bleeds  easily.  All other systems reviewed and are negative.      Objective:   Physical Exam Vitals reviewed.  HENT:     Head: Normocephalic.  Cardiovascular:     Rate and Rhythm: Normal rate.     Pulses: Normal pulses.  Pulmonary:     Effort: Pulmonary effort is normal.  Skin:    General: Skin is warm and dry.       Neurological:     Mental Status: He is alert and oriented to person, place, and time.     Motor: Weakness present.     Gait: Gait abnormal.  Psychiatric:        Mood and Affect: Mood normal.        Behavior: Behavior normal.        Thought Content: Thought content normal.        Judgment: Judgment normal.     BP (!) 161/60 (BP Location: Right Arm)   Pulse 79   Resp 16   Wt 165 lb 6.4 oz (75 kg)   BMI 27.52 kg/m   Past Medical History:  Diagnosis Date  . Alcohol abuse   . Amaurosis fugax   . Atrial fibrillation (HCC)   . Carotid stenosis   . CHF (congestive heart failure) (HCC)   . Coagulopathy (HCC)   . COPD (chronic obstructive pulmonary disease) (HCC)   . Coronary artery disease   . Depression   . Hyperlipemia   . Hypotension   . Leucocytosis   .  Liver dysfunction   . Myocardial infarction (HCC)    non-STEMI. approx 2016  . Peripheral artery disease (HCC)    legs  . Tobacco abuse   . Wears dentures    full upper and lower    Social History   Socioeconomic History  . Marital status: Married    Spouse name: Not on file  . Number of children: Not on file  . Years of education: Not on file  . Highest education level: Not on file  Occupational History  . Not on file  Tobacco Use  . Smoking status: Former Smoker    Packs/day: 1.00    Years: 54.00    Pack years: 54.00    Types: Cigarettes    Quit date: 03/16/2020    Years since quitting: 0.2  . Smokeless tobacco: Never Used  . Tobacco comment: was 2.5 PPD for 50 yrs until 2016, quit a "few days ago"  Vaping Use  . Vaping Use: Never used  Substance and Sexual Activity  . Alcohol use:  Not Currently    Comment: quit over 15 yrs ago  . Drug use: Not Currently  . Sexual activity: Not on file  Other Topics Concern  . Not on file  Social History Narrative  . Not on file   Social Determinants of Health   Financial Resource Strain:   . Difficulty of Paying Living Expenses: Not on file  Food Insecurity:   . Worried About Programme researcher, broadcasting/film/video in the Last Year: Not on file  . Ran Out of Food in the Last Year: Not on file  Transportation Needs:   . Lack of Transportation (Medical): Not on file  . Lack of Transportation (Non-Medical): Not on file  Physical Activity:   . Days of Exercise per Week: Not on file  . Minutes of Exercise per Session: Not on file  Stress:   . Feeling of Stress : Not on file  Social Connections:   . Frequency of Communication with Friends and Family: Not on file  . Frequency of Social Gatherings with Friends and Family: Not on file  . Attends Religious Services: Not on file  . Active Member of Clubs or Organizations: Not on file  . Attends Banker Meetings: Not on file  . Marital Status: Not on file  Intimate Partner Violence:   . Fear of Current or Ex-Partner: Not on file  . Emotionally Abused: Not on file  . Physically Abused: Not on file  . Sexually Abused: Not on file    Past Surgical History:  Procedure Laterality Date  . CARDIAC CATHETERIZATION N/A 07/10/2015   Procedure: Right/Left Heart Cath and Coronary Angiography;  Surgeon: Alwyn Pea, MD;  Location: [facility];  Service: Cardiovascular;  Laterality: N/A;  . CORONARY ARTERY BYPASS GRAFT  07/13/2015   [facility].  4 vessel  . ENDARTERECTOMY FEMORAL Bilateral 05/23/2020   Procedure: ENDARTERECTOMY FEMORAL BILATERAL SUPERFICIAL FEMORAL ARTERY STENTS ;  Surgeon: Renford Dills, MD;  Location: [facility];  Service: Vascular;  Laterality: Bilateral;  . INSERTION OF ILIAC STENT Left 05/23/2020   Procedure: INSERTION OF ILIAC STENT;  Surgeon: Renford Dills,  MD;  Location: [facility];  Service: Vascular;  Laterality: Left;  . LEFT HEART CATH AND CORS/GRAFTS ANGIOGRAPHY N/A 01/23/2020   Procedure: LEFT HEART CATH AND CORS/GRAFTS ANGIOGRAPHY;  Surgeon: Alwyn Pea, MD;  Location: [facility];  Service: Cardiovascular;  Laterality: N/A;  . LOWER EXTREMITY ANGIOGRAPHY Right 04/03/2020  Procedure: LOWER EXTREMITY ANGIOGRAPHY;  Surgeon: Renford Dills, MD;  Location: [facility];  Service: Cardiovascular;  Laterality: Right;  . OLECRANON BURSECTOMY Left 05/06/2019   Procedure: OLECRANON BURSECTOMY AND DEBRIDEMENT;  Surgeon: Signa Kell, MD;  Location: [facility];  Service: Orthopedics;  Laterality: Left;    Family History  Problem Relation Age of Onset  . Depression Sister     No Known Allergies     Assessment & Plan:   1. Atherosclerosis of artery of extremity with rest pain (HCC) Besides the postoperative wound infection, PE patient's left-sided wound is healing fine.  The patient notes that walking with a left lower extremity is much improved from baseline.  He is unable to tell with the right lower extremity due to the pain in the groin area.  The swelling has greatly improved in his bilateral lower extremities.  Currently the patient has adequate perfusion.  2. Tobacco use Smoking cessation was discussed, 3-10 minutes spent on this topic specifically   3. Postoperative wound infection The patient has a large wound dehiscence in the right groin with extensive slough that has dried in the wound area.  Wound skin changes present today it is noted by the wife and home health complaints that the patient has extensive drainage coming from the wound.  Based on this the most appropriate course of action would be debridement with wound VAC placement.  We will schedule this as soon as possible to expedite wound healing.   Current Outpatient Medications on File Prior to Visit  Medication Sig Dispense Refill  . acetaminophen  (TYLENOL) 325 MG tablet Take 650 mg by mouth every 6 (six) hours as needed for mild pain or headache.     . albuterol (PROVENTIL) (2.5 MG/3ML) 0.083% nebulizer solution Take 3 mLs (2.5 mg total) by nebulization every 6 (six) hours as needed for wheezing or shortness of breath. 360 mL 0  . albuterol (VENTOLIN HFA) 108 (90 Base) MCG/ACT inhaler Inhale 1-2 puffs into the lungs every 6 (six) hours as needed for wheezing or shortness of breath. 8 g 0  . atorvastatin (LIPITOR) 80 MG tablet Take 1 tablet (80 mg total) by mouth daily at 6 PM.    . clopidogrel (PLAVIX) 75 MG tablet Take 75 mg by mouth every evening.     . docusate sodium (COLACE) 100 MG capsule Take 1 capsule (100 mg total) by mouth 2 (two) times daily. 60 capsule 0  . Fluticasone-Umeclidin-Vilant (TRELEGY ELLIPTA) 100-62.5-25 MCG/INH AEPB Inhale 1 puff into the lungs every other day.     . furosemide (LASIX) 40 MG tablet Take 40 mg by mouth daily.     Marland Kitchen oxyCODONE-acetaminophen (PERCOCET/ROXICET) 5-325 MG tablet Take 1-2 tablets by mouth every 6 (six) hours as needed for moderate pain or severe pain. 28 tablet 0  . senna-docusate (SENOKOT-S) 8.6-50 MG tablet Take 1 tablet by mouth at bedtime as needed for mild constipation. 30 tablet 0  . amoxicillin-clavulanate (AUGMENTIN) 875-125 MG tablet Take 1 tablet by mouth 2 (two) times daily. (Patient not taking: Reported on 06/20/2020) 14 tablet 0  . aspirin EC 81 MG tablet Take 1 tablet (81 mg total) by mouth daily. Swallow whole. (Patient not taking: Reported on 05/16/2020) 150 tablet 1   No current facility-administered medications on file prior to visit.    There are no Patient Instructions on file for this visit. No follow-ups on file.   Georgiana Spinner, NP

## 2020-06-21 NOTE — Patient Instructions (Signed)
Your procedure is scheduled on: 06/22/20 Report to DAY SURGERY DEPARTMENT LOCATED ON 2ND FLOOR MEDICAL MALL ENTRANCE. To find out your arrival time please call [phone] between 1PM - 3PM on 06/21/20.  Remember: Instructions that are not followed completely may result in serious medical risk, up to and including death, or upon the discretion of your surgeon and anesthesiologist your surgery may need to be rescheduled.     _X__ 1. Do not eat food after midnight the night before your procedure.                 No gum chewing or hard candies. You may drink clear liquids up to 2 hours                 before you are scheduled to arrive for your surgery- DO not drink clear                 liquids within 2 hours of the start of your surgery.                 Clear Liquids include:  water, apple juice without pulp, clear carbohydrate                 drink such as Clearfast or Gatorade, Black Coffee or Tea (Do not add                 anything to coffee or tea). Diabetics water only  __X__2.  On the morning of surgery brush your teeth with toothpaste and water, you                 may rinse your mouth with mouthwash if you wish.  Do not swallow any              toothpaste of mouthwash.     _X__ 3.  No Alcohol for 24 hours before or after surgery.   _X__ 4.  Do Not Smoke or use e-cigarettes For 24 Hours Prior to Your Surgery.                 Do not use any chewable tobacco products for at least 6 hours prior to                 surgery.  ____  5.  Bring all medications with you on the day of surgery if instructed.   __X__  6.  Notify your doctor if there is any change in your medical condition      (cold, fever, infections).     Do not wear jewelry, make-up, hairpins, clips or nail polish. Do not wear lotions, powders, or perfumes.  Do not shave 48 hours prior to surgery. Men may shave face and neck. Do not bring valuables to the hospital.    [facility] is not responsible for any belongings or  valuables.  Contacts, dentures/partials or body piercings may not be worn into surgery. Bring a case for your contacts, glasses or hearing aids, a denture cup will be supplied. Leave your suitcase in the car. After surgery it may be brought to your room. For patients admitted to the hospital, discharge time is determined by your treatment team.   Patients discharged the day of surgery will not be allowed to drive home.   Please read over the following fact sheets that you were given:   MRSA Information  __X__ Take these medicines the morning of surgery with A SIP OF WATER:  1. none  2.   3.   4.  5.  6.  ____ Fleet Enema (as directed)   ____ Use CHG Soap/SAGE wipes as directed  __X__ Use inhalers on the day of surgery  NEBULIZER TREATMENT BEFORE ARRIVAL  ____ Stop metformin/Janumet/Farxiga 2 days prior to surgery    ____ Take 1/2 of usual insulin dose the night before surgery. No insulin the morning          of surgery.   __X__ Stop Blood Thinners Coumadin/Plavix/Xarelto/Pleta/Pradaxa/Eliquis/Effient/Aspirin  on   Or contact your Surgeon, Cardiologist or Medical Doctor regarding  ability to stop your blood thinners  __X__ Stop Anti-inflammatories 7 days before surgery such as Advil, Ibuprofen, Motrin,  BC or Goodies Powder, Naprosyn, Naproxen, Aleve, Aspirin    __X__ Stop all herbal supplements, fish oil or vitamin E until after surgery.    ____ Bring C-Pap to the hospital.

## 2020-06-22 ENCOUNTER — Ambulatory Visit
Admission: RE | Admit: 2020-06-22 | Discharge: 2020-06-22 | Disposition: A | Discharge status: Home / Self Care | Payer: Medicare Other | Attending: Vascular Surgery | Admitting: Vascular Surgery

## 2020-06-22 ENCOUNTER — Encounter
Admission: RE | Disposition: A | Discharge status: Home / Self Care | Payer: Self-pay | Source: Home / Self Care | Attending: Vascular Surgery

## 2020-06-22 ENCOUNTER — Encounter: Payer: Self-pay | Admitting: Vascular Surgery

## 2020-06-22 ENCOUNTER — Ambulatory Visit: Payer: Medicare Other | Admitting: Anesthesiology

## 2020-06-22 ENCOUNTER — Other Ambulatory Visit: Payer: Self-pay

## 2020-06-22 DIAGNOSIS — T8131XA Disruption of external operation (surgical) wound, not elsewhere classified, initial encounter: Secondary | ICD-10-CM | POA: Diagnosis present

## 2020-06-22 DIAGNOSIS — J449 Chronic obstructive pulmonary disease, unspecified: Secondary | ICD-10-CM | POA: Insufficient documentation

## 2020-06-22 DIAGNOSIS — Z79899 Other long term (current) drug therapy: Secondary | ICD-10-CM | POA: Diagnosis not present

## 2020-06-22 DIAGNOSIS — X58XXXA Exposure to other specified factors, initial encounter: Secondary | ICD-10-CM | POA: Diagnosis not present

## 2020-06-22 DIAGNOSIS — I509 Heart failure, unspecified: Secondary | ICD-10-CM | POA: Insufficient documentation

## 2020-06-22 DIAGNOSIS — I2510 Atherosclerotic heart disease of native coronary artery without angina pectoris: Secondary | ICD-10-CM | POA: Insufficient documentation

## 2020-06-22 DIAGNOSIS — E785 Hyperlipidemia, unspecified: Secondary | ICD-10-CM | POA: Diagnosis not present

## 2020-06-22 DIAGNOSIS — T8149XA Infection following a procedure, other surgical site, initial encounter: Secondary | ICD-10-CM | POA: Insufficient documentation

## 2020-06-22 DIAGNOSIS — I70229 Atherosclerosis of native arteries of extremities with rest pain, unspecified extremity: Secondary | ICD-10-CM | POA: Insufficient documentation

## 2020-06-22 DIAGNOSIS — Z7902 Long term (current) use of antithrombotics/antiplatelets: Secondary | ICD-10-CM | POA: Insufficient documentation

## 2020-06-22 DIAGNOSIS — I9789 Other postprocedural complications and disorders of the circulatory system, not elsewhere classified: Secondary | ICD-10-CM | POA: Diagnosis not present

## 2020-06-22 DIAGNOSIS — I6529 Occlusion and stenosis of unspecified carotid artery: Secondary | ICD-10-CM | POA: Diagnosis not present

## 2020-06-22 DIAGNOSIS — I252 Old myocardial infarction: Secondary | ICD-10-CM | POA: Insufficient documentation

## 2020-06-22 DIAGNOSIS — Z7951 Long term (current) use of inhaled steroids: Secondary | ICD-10-CM | POA: Insufficient documentation

## 2020-06-22 HISTORY — PX: APPLICATION OF WOUND VAC: SHX5189

## 2020-06-22 HISTORY — PX: WOUND DEBRIDEMENT: SHX247

## 2020-06-22 LAB — BASIC METABOLIC PANEL
Anion gap: 10 (ref 5–15)
BUN: 11 mg/dL (ref 8–23)
CO2: 30 mmol/L (ref 22–32)
Calcium: 9.1 mg/dL (ref 8.9–10.3)
Chloride: 100 mmol/L (ref 98–111)
Creatinine, Ser: 0.91 mg/dL (ref 0.61–1.24)
GFR calc Af Amer: >60 mL/min (ref >60)
GFR calc non Af Amer: >60 mL/min (ref >60)
Glucose, Bld: 105 mg/dL — ABNORMAL HIGH (ref 70–99)
Potassium: 3.7 mmol/L (ref 3.5–5.1)
Sodium: 140 mmol/L (ref 135–145)

## 2020-06-22 LAB — CBC WITH DIFFERENTIAL/PLATELET
Abs Immature Granulocytes: 0.03 10*3/uL (ref 0.00–0.07)
Basophils Absolute: 0 10*3/uL (ref 0.0–0.1)
Basophils Relative: 0 %
Eosinophils Absolute: 0.4 10*3/uL (ref 0.0–0.5)
Eosinophils Relative: 4 %
HCT: 40 % (ref 39.0–52.0)
Hemoglobin: 12.7 g/dL — ABNORMAL LOW (ref 13.0–17.0)
Immature Granulocytes: 0 %
Lymphocytes Relative: 11 %
Lymphs Abs: 1.1 10*3/uL (ref 0.7–4.0)
MCH: 29.3 pg (ref 26.0–34.0)
MCHC: 31.8 g/dL (ref 30.0–36.0)
MCV: 92.2 fL (ref 80.0–100.0)
Monocytes Absolute: 1.2 10*3/uL — ABNORMAL HIGH (ref 0.1–1.0)
Monocytes Relative: 12 %
Neutro Abs: 7.1 10*3/uL (ref 1.7–7.7)
Neutrophils Relative %: 73 %
Platelets: 274 10*3/uL (ref 150–400)
RBC: 4.34 MIL/uL (ref 4.22–5.81)
RDW: 13.7 % (ref 11.5–15.5)
WBC: 9.8 10*3/uL (ref 4.0–10.5)
nRBC: 0 % (ref 0.0–0.2)

## 2020-06-22 LAB — TYPE AND SCREEN
ABO/RH(D): A NEG
Antibody Screen: NEGATIVE

## 2020-06-22 LAB — PROTIME-INR
INR: 1.1 (ref 0.8–1.2)
Prothrombin Time: 14.1 seconds (ref 11.4–15.2)

## 2020-06-22 LAB — APTT: aPTT: 35 seconds (ref 24–36)

## 2020-06-22 SURGERY — DEBRIDEMENT, WOUND
Anesthesia: General | Site: Groin | Laterality: Right

## 2020-06-22 MED ORDER — BUPIVACAINE HCL (PF) 0.5 % IJ SOLN
INTRAMUSCULAR | Status: AC
  Filled 2020-06-22: qty 30

## 2020-06-22 MED ORDER — CHLORHEXIDINE GLUCONATE CLOTH 2 % EX PADS
6.0000 | MEDICATED_PAD | Freq: Once | CUTANEOUS | Status: AC
  Administered 2020-06-22: 6 via TOPICAL

## 2020-06-22 MED ORDER — PROPOFOL 10 MG/ML IV BOLUS
INTRAVENOUS | Status: DC | PRN
  Administered 2020-06-22: 100 mg via INTRAVENOUS
  Administered 2020-06-22: 30 mg via INTRAVENOUS

## 2020-06-22 MED ORDER — FAMOTIDINE 20 MG PO TABS: 20.0000 mg | ORAL_TABLET | Freq: Once | ORAL | Status: AC

## 2020-06-22 MED ORDER — CHLORHEXIDINE GLUCONATE 0.12 % MT SOLN: 15.0000 mL | Freq: Once | OROMUCOSAL | Status: AC

## 2020-06-22 MED ORDER — ONDANSETRON HCL 4 MG/2ML IJ SOLN
INTRAMUSCULAR | Status: AC
  Filled 2020-06-22: qty 2

## 2020-06-22 MED ORDER — FENTANYL CITRATE (PF) 100 MCG/2ML IJ SOLN
25.0000 ug | INTRAMUSCULAR | Status: DC | PRN
  Administered 2020-06-22: 50 ug via INTRAVENOUS

## 2020-06-22 MED ORDER — FAMOTIDINE 20 MG PO TABS
ORAL_TABLET | ORAL | Status: AC
  Administered 2020-06-22: 20 mg via ORAL
  Filled 2020-06-22: qty 1

## 2020-06-22 MED ORDER — CHLORHEXIDINE GLUCONATE 0.12 % MT SOLN
OROMUCOSAL | Status: AC
  Administered 2020-06-22: 15 mL via OROMUCOSAL
  Filled 2020-06-22: qty 15

## 2020-06-22 MED ORDER — CEFAZOLIN SODIUM-DEXTROSE 2-4 GM/100ML-% IV SOLN
INTRAVENOUS | Status: AC
  Filled 2020-06-22: qty 100

## 2020-06-22 MED ORDER — OXYCODONE HCL 5 MG/5ML PO SOLN: 5.0000 mg | Freq: Once | ORAL | Status: AC | PRN

## 2020-06-22 MED ORDER — ONDANSETRON HCL 4 MG/2ML IJ SOLN: 4.0000 mg | Freq: Once | INTRAMUSCULAR | Status: DC | PRN

## 2020-06-22 MED ORDER — FENTANYL CITRATE (PF) 100 MCG/2ML IJ SOLN
INTRAMUSCULAR | Status: DC | PRN
Start: 2020-06-22 — End: 2020-06-22
  Administered 2020-06-22: 25 ug via INTRAVENOUS
  Administered 2020-06-22: 50 ug via INTRAVENOUS
  Administered 2020-06-22: 25 ug via INTRAVENOUS

## 2020-06-22 MED ORDER — FENTANYL CITRATE (PF) 100 MCG/2ML IJ SOLN
INTRAMUSCULAR | Status: AC
  Filled 2020-06-22: qty 2

## 2020-06-22 MED ORDER — ACETAMINOPHEN 10 MG/ML IV SOLN: 1000.0000 mg | Freq: Once | INTRAVENOUS | Status: DC | PRN

## 2020-06-22 MED ORDER — CEFAZOLIN SODIUM-DEXTROSE 2-4 GM/100ML-% IV SOLN
2.0000 g | INTRAVENOUS | Status: AC
  Administered 2020-06-22: 2 g via INTRAVENOUS

## 2020-06-22 MED ORDER — OXYCODONE HCL 5 MG PO TABS
5.0000 mg | ORAL_TABLET | Freq: Once | ORAL | Status: AC | PRN
  Administered 2020-06-22: 5 mg via ORAL

## 2020-06-22 MED ORDER — LACTATED RINGERS IV SOLN: INTRAVENOUS | Status: DC

## 2020-06-22 MED ORDER — ORAL CARE MOUTH RINSE: 15.0000 mL | Freq: Once | OROMUCOSAL | Status: AC

## 2020-06-22 MED ORDER — PROPOFOL 10 MG/ML IV BOLUS
INTRAVENOUS | Status: AC
  Filled 2020-06-22: qty 20

## 2020-06-22 MED ORDER — LIDOCAINE HCL (CARDIAC) PF 100 MG/5ML IV SOSY
PREFILLED_SYRINGE | INTRAVENOUS | Status: DC | PRN
  Administered 2020-06-22: 80 mg via INTRAVENOUS

## 2020-06-22 MED ORDER — LIDOCAINE HCL (PF) 1 % IJ SOLN
INTRAMUSCULAR | Status: AC
  Filled 2020-06-22: qty 30

## 2020-06-22 MED ORDER — ONDANSETRON HCL 4 MG/2ML IJ SOLN
INTRAMUSCULAR | Status: DC | PRN
  Administered 2020-06-22: 4 mg via INTRAVENOUS

## 2020-06-22 MED ORDER — OXYCODONE HCL 5 MG PO TABS
ORAL_TABLET | ORAL | Status: AC
  Filled 2020-06-22: qty 1

## 2020-06-22 MED ORDER — LIDOCAINE HCL (PF) 2 % IJ SOLN
INTRAMUSCULAR | Status: AC
  Filled 2020-06-22: qty 10

## 2020-06-22 SURGICAL SUPPLY — 44 items
APL PRP STRL LF DISP 70% ISPRP (MISCELLANEOUS)
BRUSH SCRUB EZ  4% CHG (MISCELLANEOUS)
BRUSH SCRUB EZ 4% CHG (MISCELLANEOUS) ×1 IMPLANT
CANISTER SUCT 1200ML W/VALVE (MISCELLANEOUS) ×1 IMPLANT
CANISTER WOUND CARE 500ML ATS (WOUND CARE) ×1 IMPLANT
CHLORAPREP W/TINT 26 (MISCELLANEOUS) ×1 IMPLANT
COVER WAND RF STERILE (DRAPES) ×3 IMPLANT
DRAPE INCISE IOBAN 66X45 STRL (DRAPES) ×3 IMPLANT
DRAPE LAPAROTOMY 100X77 ABD (DRAPES) ×3 IMPLANT
DRSG VAC ATS LRG SENSATRAC (GAUZE/BANDAGES/DRESSINGS) ×1 IMPLANT
DRSG VAC ATS MED SENSATRAC (GAUZE/BANDAGES/DRESSINGS) ×1 IMPLANT
ELECT CAUTERY BLADE 6.4 (BLADE) ×3 IMPLANT
ELECT REM PT RETURN 9FT ADLT (ELECTROSURGICAL) ×3
ELECTRODE REM PT RTRN 9FT ADLT (ELECTROSURGICAL) ×1 IMPLANT
GLOVE BIO SURGEON STRL SZ7 (GLOVE) ×6 IMPLANT
GLOVE BIOGEL PI IND STRL 7.5 (GLOVE) ×1 IMPLANT
GLOVE BIOGEL PI INDICATOR 7.5 (GLOVE) ×2
GOWN STRL REUS W/ TWL LRG LVL3 (GOWN DISPOSABLE) ×2 IMPLANT
GOWN STRL REUS W/ TWL XL LVL3 (GOWN DISPOSABLE) ×1 IMPLANT
GOWN STRL REUS W/TWL LRG LVL3 (GOWN DISPOSABLE) ×6
GOWN STRL REUS W/TWL XL LVL3 (GOWN DISPOSABLE) ×3
IV NS 1000ML (IV SOLUTION) ×3
IV NS 1000ML BAXH (IV SOLUTION) ×1 IMPLANT
KIT TURNOVER KIT A (KITS) ×3 IMPLANT
LABEL OR SOLS (LABEL) ×3 IMPLANT
MANIFOLD NEPTUNE II (INSTRUMENTS) ×2 IMPLANT
NS IRRIG 1000ML POUR BTL (IV SOLUTION) IMPLANT
NS IRRIG 500ML POUR BTL (IV SOLUTION) ×3 IMPLANT
PACK BASIN MINOR (MISCELLANEOUS) ×3 IMPLANT
PACK EXTREMITY (MISCELLANEOUS) ×3 IMPLANT
PAD PREP 24X41 OB/GYN DISP (PERSONAL CARE ITEMS) ×3 IMPLANT
PULSAVAC PLUS IRRIG FAN TIP (DISPOSABLE)
SOL PREP PVP 2OZ (MISCELLANEOUS)
SOLUTION PREP PVP 2OZ (MISCELLANEOUS) ×1 IMPLANT
SPONGE LAP 18X18 RF (DISPOSABLE) ×3 IMPLANT
SUT ETHILON 4-0 (SUTURE)
SUT ETHILON 4-0 FS2 18XMFL BLK (SUTURE)
SUT VIC AB 3-0 SH 27 (SUTURE)
SUT VIC AB 3-0 SH 27X BRD (SUTURE) IMPLANT
SUTURE ETHLN 4-0 FS2 18XMF BLK (SUTURE) IMPLANT
SWAB CULTURE AMIES ANAERIB BLU (MISCELLANEOUS) ×3 IMPLANT
SYR BULB IRRIG 60ML STRL (SYRINGE) ×3 IMPLANT
TIP FAN IRRIG PULSAVAC PLUS (DISPOSABLE) ×1 IMPLANT
TRAY PREP VAG/GEN (MISCELLANEOUS) ×2 IMPLANT

## 2020-06-22 NOTE — Op Note (Signed)
    OPERATIVE NOTE   PROCEDURE: 1. Irrigation and debridement of skin, soft tissue, and muscle for approximately 20 cm right groin with placement of a negative pressure wound VAC dressing  PRE-OPERATIVE DIAGNOSIS: Nonviable tissue right groin wound  POST-OPERATIVE DIAGNOSIS: Same as above  SURGEON: [name], MD  ASSISTANT(S): Raul Del, PA-C  ANESTHESIA: General  ESTIMATED BLOOD LOSS: 3 cc  FINDING(S): None  SPECIMEN(S): None  INDICATIONS:   Dennis Preston is a 69 y.o. male who presents with an open wound with clearly nonviable tissue at the superior aspect of the previous right femoral incision.  This needs debrided to get down to healthy tissues and a wound VAC is going to be placed augment healing.  Risks and benefits are discussed and the patient is agreeable to proceed. An assistant was present during the procedure to help facilitate the exposure and expedite the procedure.  DESCRIPTION: After obtaining full informed written consent, the patient was brought back to the operating room and placed supine upon the operating table.  The patient received IV antibiotics prior to induction.  After obtaining adequate anesthesia, the patient was prepped and draped in the standard fashion for. The assistant provided retraction and mobilization to help facilitate exposure and expedite the procedure throughout the entire procedure.  This included following suture, using retractors, and optimizing lighting. The wound was then opened and excisional debridement was performed to the skin, soft tissue, and muscle to remove all clearly non-viable tissue.  The tissue was taken back to bleeding tissue that appeared viable.  The debridement was performed with Metzenbaum scissors and electrocautery and encompassed an area of approximately 20 cm2.  The wound was about 5 cm long, and about 4 cm wide at its widest area.  There is about 2 cm of depth.  After all clearly non-viable tissue was removed, a  negative pressure dressing was cut to fit the wound and strips of Ioban were used for a good occlusive seal.  Suction was connected and there was a good seal. The patient was then awakened from anesthesia and taken to the recovery room in stable condition having tolerated the procedure well.  COMPLICATIONS: none  CONDITION: stable  [name]  06/22/2020, 1:42 PM   This note was created with Dragon Medical transcription system. Any errors in dictation are purely unintentional.

## 2020-06-22 NOTE — Anesthesia Procedure Notes (Signed)
Procedure Name: LMA Insertion Date/Time: 06/22/2020 1:12 PM Performed by: Iran Planas, CRNA Pre-anesthesia Checklist: Patient identified, Emergency Drugs available, Suction available, Patient being monitored and Timeout performed Patient Re-evaluated:Patient Re-evaluated prior to induction Oxygen Delivery Method: Circle system utilized Preoxygenation: Pre-oxygenation with 100% oxygen Induction Type: IV induction Ventilation: Mask ventilation without difficulty LMA: LMA inserted LMA Size: 4.0 Number of attempts: 1 Dental Injury: Teeth and Oropharynx as per pre-operative assessment

## 2020-06-22 NOTE — Anesthesia Preprocedure Evaluation (Addendum)
Anesthesia Evaluation  Patient identified by MRN, date of birth, ID band Patient awake    Reviewed: Allergy & Precautions, NPO status , Patient's Chart, lab work & pertinent test results  History of Anesthesia Complications Negative for: history of anesthetic complications  Airway Mallampati: II  TM Distance: >3 FB Neck ROM: Full    Dental  (+) Upper Dentures, Lower Dentures   Pulmonary neg sleep apnea, COPD,  COPD inhaler, Patient abstained from smoking.Not current smoker, former smoker,  Hospitalized a few months ago for COPD exacerbation. Takes inhalers every few days. Has O2 at home if needed, uses every few days Took inhaler this morning prophylactically   Pulmonary exam normal breath sounds clear to auscultation       Cardiovascular Exercise Tolerance: Good METS(-) hypertension+ CAD, + Past MI, + Peripheral Vascular Disease and +CHF  (-) dysrhythmias  Rhythm:Regular Rate:Normal - Systolic murmurs Left heart cath 2021: "Mildly depressed left ventricular function with inferior hypokinesis EF around 45 to 50% Severe multivessel coronary disease including distal left main ostial LAD ostial circumflex Patent grafts LIMA to the LAD SVG to distal RCA SVG to ramus Occluded graft to distal circumflex Recommend medical therapy for now"    Neuro/Psych PSYCHIATRIC DISORDERS Anxiety Depression    GI/Hepatic neg GERD  ,(+)     (-) substance abuse  ,   Endo/Other  neg diabetes  Renal/GU negative Renal ROS     Musculoskeletal   Abdominal   Peds  Hematology   Anesthesia Other Findings Past Medical History: No date: Alcohol abuse No date: Amaurosis fugax No date: Atrial fibrillation (HCC) No date: Carotid stenosis No date: CHF (congestive heart failure) (HCC) No date: Coagulopathy (HCC) No date: COPD (chronic obstructive pulmonary disease) (HCC) No date: Coronary artery disease No date: Depression No date:  Hyperlipemia No date: Hypotension No date: Leucocytosis No date: Liver dysfunction No date: Myocardial infarction [facility])     Comment:  non-STEMI. approx 2016 No date: Peripheral artery disease (HCC)     Comment:  legs No date: Tobacco abuse No date: Wears dentures     Comment:  full upper and lower  Reproductive/Obstetrics                            Anesthesia Physical Anesthesia Plan  ASA: III  Anesthesia Plan: General   Post-op Pain Management:    Induction: Intravenous  PONV Risk Score and Plan: 2 and Ondansetron, Dexamethasone and Treatment may vary due to age or medical condition  Airway Management Planned: Oral ETT and LMA  Additional Equipment: None  Intra-op Plan:   Post-operative Plan: Extubation in OR  Informed Consent: I have reviewed the patients History and Physical, chart, labs and discussed the procedure including the risks, benefits and alternatives for the proposed anesthesia with the patient or authorized representative who has indicated his/her understanding and acceptance.     Dental advisory given  Plan Discussed with: CRNA and Surgeon  Anesthesia Plan Comments: (Discussed risks of anesthesia with patient, including PONV, sore throat, lip/dental damage. Rare risks discussed as well, such as cardiorespiratory and neurological sequelae. Patient understands.)        Anesthesia Quick Evaluation

## 2020-06-22 NOTE — Discharge Instructions (Signed)

## 2020-06-22 NOTE — Transfer of Care (Signed)
Immediate Anesthesia Transfer of Care Note  Patient: Dennis Preston  Procedure(s) Performed: DEBRIDEMENT WOUND RIGHT GROIN (Right Groin) APPLICATION OF WOUND VAC (Right Groin)  Patient Location: PACU  Anesthesia Type:General  Level of Consciousness: awake, alert  and oriented  Airway & Oxygen Therapy: Patient Spontanous Breathing and Patient connected to face mask oxygen  Post-op Assessment: Report given to RN and Post -op Vital signs reviewed and stable  Post vital signs: Reviewed and stable  Last Vitals:  Vitals Value Taken Time  BP    Temp    Pulse 59 06/22/20 1354  Resp 18 06/22/20 1354  SpO2 100 % 06/22/20 1354  Vitals shown include unvalidated device data.  Last Pain:  Vitals:   06/22/20 1043  TempSrc: Temporal  PainSc: 6          Complications: No complications documented.

## 2020-06-22 NOTE — Interval H&P Note (Signed)
History and Physical Interval Note:  06/22/2020 12:45 PM  Dennis Preston  has presented today for surgery, with the diagnosis of Right groin wound infection.  The various methods of treatment have been discussed with the patient and family. After consideration of risks, benefits and other options for treatment, the patient has consented to  Procedure(s): DEBRIDEMENT WOUND (Right) APPLICATION OF WOUND VAC (Right) as a surgical intervention.  The patient's history has been reviewed, patient examined, no change in status, stable for surgery.  I have reviewed the patient's chart and labs.  Questions were answered to the patient's satisfaction.     [name]

## 2020-06-22 NOTE — Anesthesia Postprocedure Evaluation (Signed)
Anesthesia Post Note  Patient: Dennis Preston  Procedure(s) Performed: DEBRIDEMENT WOUND RIGHT GROIN (Right Groin) APPLICATION OF WOUND VAC (Right Groin)  Patient location during evaluation: PACU Anesthesia Type: General Level of consciousness: awake and alert Pain management: pain level controlled Vital Signs Assessment: post-procedure vital signs reviewed and stable Respiratory status: spontaneous breathing, nonlabored ventilation, respiratory function stable and patient connected to nasal cannula oxygen Cardiovascular status: blood pressure returned to baseline and stable Postop Assessment: no apparent nausea or vomiting Anesthetic complications: no   No complications documented.   Last Vitals:  Vitals:   06/22/20 1427 06/22/20 1441  BP: (!) 164/87 (!) 136/55  Pulse: 76 (!) 56  Resp: 19 19  Temp:  36.7 C  SpO2: 93% 98%    Last Pain:  Vitals:   06/22/20 1427  TempSrc:   PainSc: 5                  Corinda Gubler

## 2020-06-23 ENCOUNTER — Encounter: Payer: Self-pay | Admitting: Vascular Surgery

## 2020-07-06 ENCOUNTER — Other Ambulatory Visit (INDEPENDENT_AMBULATORY_CARE_PROVIDER_SITE_OTHER): Payer: Self-pay | Admitting: Vascular Surgery

## 2020-07-06 DIAGNOSIS — Z95820 Peripheral vascular angioplasty status with implants and grafts: Secondary | ICD-10-CM

## 2020-07-06 DIAGNOSIS — I70213 Atherosclerosis of native arteries of extremities with intermittent claudication, bilateral legs: Secondary | ICD-10-CM

## 2020-07-09 ENCOUNTER — Encounter (INDEPENDENT_AMBULATORY_CARE_PROVIDER_SITE_OTHER): Payer: Self-pay | Admitting: Vascular Surgery

## 2020-07-09 ENCOUNTER — Ambulatory Visit (INDEPENDENT_AMBULATORY_CARE_PROVIDER_SITE_OTHER): Payer: Medicare Other | Admitting: Vascular Surgery

## 2020-07-09 ENCOUNTER — Other Ambulatory Visit: Payer: Self-pay

## 2020-07-09 ENCOUNTER — Ambulatory Visit (INDEPENDENT_AMBULATORY_CARE_PROVIDER_SITE_OTHER): Payer: Medicare Other

## 2020-07-09 VITALS — BP 156/55 | HR 58 | Resp 16 | Wt 162.0 lb

## 2020-07-09 DIAGNOSIS — Z95820 Peripheral vascular angioplasty status with implants and grafts: Secondary | ICD-10-CM

## 2020-07-09 DIAGNOSIS — I70229 Atherosclerosis of native arteries of extremities with rest pain, unspecified extremity: Secondary | ICD-10-CM

## 2020-07-09 DIAGNOSIS — I70213 Atherosclerosis of native arteries of extremities with intermittent claudication, bilateral legs: Secondary | ICD-10-CM

## 2020-07-14 ENCOUNTER — Encounter (INDEPENDENT_AMBULATORY_CARE_PROVIDER_SITE_OTHER): Payer: Self-pay | Admitting: Vascular Surgery

## 2020-07-14 NOTE — Progress Notes (Signed)
Patient ID: Dennis Preston, male   DOB: 05/07/1951, 70 y.o.   MRN: [id number]  Chief Complaint  Patient presents with  . Follow-up    5wk follow up    HPI Dennis Preston is a 69 y.o. male.    No c/o of pain  No drainage  VAC intact   Past Medical History:  Diagnosis Date  . Alcohol abuse   . Amaurosis fugax   . Atrial fibrillation (HCC)   . Carotid stenosis   . CHF (congestive heart failure) (HCC)   . Coagulopathy (HCC)   . COPD (chronic obstructive pulmonary disease) (HCC)   . Coronary artery disease   . Depression   . Hyperlipemia   . Hypotension   . Leucocytosis   . Liver dysfunction   . Myocardial infarction (HCC)    non-STEMI. approx 2016  . Peripheral artery disease (HCC)    legs  . Tobacco abuse   . Wears dentures    full upper and lower    Past Surgical History:  Procedure Laterality Date  . APPLICATION OF WOUND VAC Right 06/22/2020   Procedure: APPLICATION OF WOUND VAC;  Surgeon: Annice Needy, MD;  Location: [facility] ORS;  Service: Vascular;  Laterality: Right;  . CARDIAC CATHETERIZATION N/A 07/10/2015   Procedure: Right/Left Heart Cath and Coronary Angiography;  Surgeon: Alwyn Pea, MD;  Location: [facility] INVASIVE CV LAB;  Service: Cardiovascular;  Laterality: N/A;  . CORONARY ARTERY BYPASS GRAFT  07/13/2015   [facility].  4 vessel  . ENDARTERECTOMY FEMORAL Bilateral 05/23/2020   Procedure: ENDARTERECTOMY FEMORAL BILATERAL SUPERFICIAL FEMORAL ARTERY STENTS ;  Surgeon: Renford Dills, MD;  Location: [facility] ORS;  Service: Vascular;  Laterality: Bilateral;  . INSERTION OF ILIAC STENT Left 05/23/2020   Procedure: INSERTION OF ILIAC STENT;  Surgeon: Renford Dills, MD;  Location: [facility] ORS;  Service: Vascular;  Laterality: Left;  . LEFT HEART CATH AND CORS/GRAFTS ANGIOGRAPHY N/A 01/23/2020   Procedure: LEFT HEART CATH AND CORS/GRAFTS ANGIOGRAPHY;  Surgeon: Alwyn Pea, MD;  Location: [facility] IN[facility] CV LAB;  Service: Cardiovascular;  Laterality: N/A;  .  LOWER EXTREMITY ANGIOGRAPHY Right 04/03/2020   Procedure: LOWER EXTREMITY ANGIOGRAPHY;  Surgeon: Renford Dills, MD;  Location: [facility] INV[facility] CV LAB;  Service: Cardiovascular;  Laterality: Right;  . OLECRANON BURSECTOMY Left 05/06/2019   Procedure: OLECRANON BURSECTOMY AND DEBRIDEMENT;  Surgeon: Signa Kell, MD;  Location: [facility] ORS;  Service: Orthopedics;  Laterality: Left;  . WOUND DEBRIDEMENT Right 06/22/2020   Procedure: DEBRIDEMENT WOUND RIGHT GROIN;  Surgeon: Annice Needy, MD;  Location: [facility] ORS;  Service: Vascular;  Laterality: Right;      No Known Allergies  Current Outpatient Medications  Medication Sig Dispense Refill  . acetaminophen (TYLENOL) 325 MG tablet Take 650 mg by mouth every 6 (six) hours as needed for mild pain or headache.     . albuterol (PROVENTIL) (2.5 MG/3ML) 0.083% nebulizer solution Take 3 mLs (2.5 mg total) by nebulization every 6 (six) hours as needed for wheezing or shortness of breath. 360 mL 0  . albuterol (VENTOLIN HFA) 108 (90 Base) MCG/ACT inhaler Inhale 1-2 puffs into the lungs every 6 (six) hours as needed for wheezing or shortness of breath. 8 g 0  . atorvastatin (LIPITOR) 80 MG tablet Take 1 tablet (80 mg total) by mouth daily at 6 PM.    . clopidogrel (PLAVIX) 75 MG tablet Take 75 mg by mouth every evening.     . docusate sodium (  COLACE) 100 MG capsule Take 1 capsule (100 mg total) by mouth 2 (two) times daily. 60 capsule 0  . Fluticasone-Umeclidin-Vilant (TRELEGY ELLIPTA) 100-62.5-25 MCG/INH AEPB Inhale 1 puff into the lungs every other day.     . furosemide (LASIX) 40 MG tablet Take 40 mg by mouth daily.     Marland Kitchen oxyCODONE-acetaminophen (PERCOCET/ROXICET) 5-325 MG tablet Take 1-2 tablets by mouth every 6 (six) hours as needed for moderate pain or severe pain. 28 tablet 0  . senna-docusate (SENOKOT-S) 8.6-50 MG tablet Take 1 tablet by mouth at bedtime as needed for mild constipation. 30 tablet 0  . amoxicillin-clavulanate (AUGMENTIN) 875-125 MG tablet  Take 1 tablet by mouth 2 (two) times daily. (Patient not taking: Reported on 06/20/2020) 14 tablet 0  . aspirin EC 81 MG tablet Take 1 tablet (81 mg total) by mouth daily. Swallow whole. (Patient not taking: Reported on 05/16/2020) 150 tablet 1   No current facility-administered medications for this visit.        Physical Exam BP (!) 156/55 (BP Location: Right Arm)   Pulse (!) 58   Resp 16   Wt 162 lb (73.5 kg)   BMI 25.37 kg/m  Gen:  WD/WN, NAD Skin: incision clean with good granulation     Assessment/Plan: 1. Atherosclerosis of artery of extremity with rest pain (HCC) Wound is improving  Continue VAC  Follow up in two weeks      Levora Dredge 07/14/2020, 11:31 AM   This note was created with Dragon medical transcription system.  Any errors from dictation are unintentional.

## 2020-07-20 ENCOUNTER — Telehealth (INDEPENDENT_AMBULATORY_CARE_PROVIDER_SITE_OTHER): Payer: Self-pay

## 2020-07-20 NOTE — Telephone Encounter (Signed)
Patient was made aware with medical advice ?

## 2020-07-20 NOTE — Telephone Encounter (Signed)
It shouldn't be an issue since he will see [place] Monday

## 2020-07-22 ENCOUNTER — Other Ambulatory Visit: Payer: Self-pay

## 2020-07-22 ENCOUNTER — Encounter
Admission: EM | Disposition: A | Discharge status: Home Health | Payer: Self-pay | Source: Home / Self Care | Attending: Vascular Surgery

## 2020-07-22 ENCOUNTER — Inpatient Hospital Stay: Payer: Medicare Other | Admitting: Anesthesiology

## 2020-07-22 ENCOUNTER — Inpatient Hospital Stay
Admission: EM | Admit: 2020-07-22 | Discharge: 2020-07-26 | DRG: 252 | Disposition: A | Discharge status: Home Health | Payer: Medicare Other | Attending: Vascular Surgery | Admitting: Vascular Surgery

## 2020-07-22 ENCOUNTER — Emergency Department: Payer: Medicare Other

## 2020-07-22 DIAGNOSIS — F1010 Alcohol abuse, uncomplicated: Secondary | ICD-10-CM | POA: Diagnosis present

## 2020-07-22 DIAGNOSIS — D689 Coagulation defect, unspecified: Secondary | ICD-10-CM | POA: Diagnosis present

## 2020-07-22 DIAGNOSIS — T81718A Complication of other artery following a procedure, not elsewhere classified, initial encounter: Principal | ICD-10-CM | POA: Diagnosis present

## 2020-07-22 DIAGNOSIS — I743 Embolism and thrombosis of arteries of the lower extremities: Secondary | ICD-10-CM | POA: Diagnosis present

## 2020-07-22 DIAGNOSIS — F32A Depression, unspecified: Secondary | ICD-10-CM | POA: Diagnosis present

## 2020-07-22 DIAGNOSIS — Z7902 Long term (current) use of antithrombotics/antiplatelets: Secondary | ICD-10-CM

## 2020-07-22 DIAGNOSIS — E785 Hyperlipidemia, unspecified: Secondary | ICD-10-CM | POA: Diagnosis present

## 2020-07-22 DIAGNOSIS — Z95820 Peripheral vascular angioplasty status with implants and grafts: Secondary | ICD-10-CM | POA: Diagnosis not present

## 2020-07-22 DIAGNOSIS — Z87891 Personal history of nicotine dependence: Secondary | ICD-10-CM

## 2020-07-22 DIAGNOSIS — S75022A Major laceration of femoral artery, left leg, initial encounter: Secondary | ICD-10-CM | POA: Diagnosis present

## 2020-07-22 DIAGNOSIS — Z818 Family history of other mental and behavioral disorders: Secondary | ICD-10-CM

## 2020-07-22 DIAGNOSIS — E861 Hypovolemia: Secondary | ICD-10-CM | POA: Diagnosis present

## 2020-07-22 DIAGNOSIS — I6529 Occlusion and stenosis of unspecified carotid artery: Secondary | ICD-10-CM | POA: Diagnosis present

## 2020-07-22 DIAGNOSIS — I10 Essential (primary) hypertension: Secondary | ICD-10-CM | POA: Diagnosis present

## 2020-07-22 DIAGNOSIS — I739 Peripheral vascular disease, unspecified: Secondary | ICD-10-CM | POA: Diagnosis present

## 2020-07-22 DIAGNOSIS — J449 Chronic obstructive pulmonary disease, unspecified: Secondary | ICD-10-CM | POA: Diagnosis present

## 2020-07-22 DIAGNOSIS — Z79899 Other long term (current) drug therapy: Secondary | ICD-10-CM | POA: Diagnosis not present

## 2020-07-22 DIAGNOSIS — Z20822 Contact with and (suspected) exposure to covid-19: Secondary | ICD-10-CM | POA: Diagnosis present

## 2020-07-22 DIAGNOSIS — I252 Old myocardial infarction: Secondary | ICD-10-CM | POA: Diagnosis not present

## 2020-07-22 DIAGNOSIS — I9589 Other hypotension: Secondary | ICD-10-CM | POA: Diagnosis present

## 2020-07-22 DIAGNOSIS — I729 Aneurysm of unspecified site: Secondary | ICD-10-CM

## 2020-07-22 DIAGNOSIS — I480 Paroxysmal atrial fibrillation: Secondary | ICD-10-CM | POA: Diagnosis present

## 2020-07-22 DIAGNOSIS — I724 Aneurysm of artery of lower extremity: Secondary | ICD-10-CM | POA: Diagnosis present

## 2020-07-22 DIAGNOSIS — Z951 Presence of aortocoronary bypass graft: Secondary | ICD-10-CM

## 2020-07-22 HISTORY — PX: FALSE ANEURYSM REPAIR: SHX5152

## 2020-07-22 LAB — CBC
HCT: 43.9 % (ref 39.0–52.0)
HCT: 33.9 % — ABNORMAL LOW (ref 39.0–52.0)
Hemoglobin: 14.4 g/dL (ref 13.0–17.0)
Hemoglobin: 11 g/dL — ABNORMAL LOW (ref 13.0–17.0)
MCH: 29.1 pg (ref 26.0–34.0)
MCH: 29.1 pg (ref 26.0–34.0)
MCHC: 32.8 g/dL (ref 30.0–36.0)
MCHC: 32.4 g/dL (ref 30.0–36.0)
MCV: 88.9 fL (ref 80.0–100.0)
MCV: 89.7 fL (ref 80.0–100.0)
Platelets: 218 10*3/uL (ref 150–400)
Platelets: 153 10*3/uL (ref 150–400)
RBC: 4.94 MIL/uL (ref 4.22–5.81)
RBC: 3.78 MIL/uL — ABNORMAL LOW (ref 4.22–5.81)
RDW: 14.6 % (ref 11.5–15.5)
RDW: 14.6 % (ref 11.5–15.5)
WBC: 9 10*3/uL (ref 4.0–10.5)
WBC: 6.6 10*3/uL (ref 4.0–10.5)
nRBC: 0 % (ref 0.0–0.2)
nRBC: 0 % (ref 0.0–0.2)

## 2020-07-22 LAB — GLUCOSE, CAPILLARY: Glucose-Capillary: 133 mg/dL — ABNORMAL HIGH (ref 70–99)

## 2020-07-22 LAB — BASIC METABOLIC PANEL
Anion gap: 10 (ref 5–15)
BUN: 11 mg/dL (ref 8–23)
CO2: 28 mmol/L (ref 22–32)
Calcium: 9.1 mg/dL (ref 8.9–10.3)
Chloride: 99 mmol/L (ref 98–111)
Creatinine, Ser: 1.02 mg/dL (ref 0.61–1.24)
GFR, Estimated: >60 mL/min (ref >60)
Glucose, Bld: 161 mg/dL — ABNORMAL HIGH (ref 70–99)
Potassium: 3.6 mmol/L (ref 3.5–5.1)
Sodium: 137 mmol/L (ref 135–145)

## 2020-07-22 LAB — COMPREHENSIVE METABOLIC PANEL
ALT: 12 U/L (ref 0–44)
AST: 16 U/L (ref 15–41)
Albumin: 3.7 g/dL (ref 3.5–5.0)
Alkaline Phosphatase: 63 U/L (ref 38–126)
Anion gap: 9 (ref 5–15)
BUN: 9 mg/dL (ref 8–23)
CO2: 24 mmol/L (ref 22–32)
Calcium: 7.9 mg/dL — ABNORMAL LOW (ref 8.9–10.3)
Chloride: 107 mmol/L (ref 98–111)
Creatinine, Ser: 0.77 mg/dL (ref 0.61–1.24)
GFR, Estimated: >60 mL/min (ref >60)
Glucose, Bld: 159 mg/dL — ABNORMAL HIGH (ref 70–99)
Potassium: 3.9 mmol/L (ref 3.5–5.1)
Sodium: 140 mmol/L (ref 135–145)
Total Bilirubin: 0.5 mg/dL (ref 0.3–1.2)
Total Protein: 5.6 g/dL — ABNORMAL LOW (ref 6.5–8.1)

## 2020-07-22 LAB — RESPIRATORY PANEL BY RT PCR (FLU A&B, COVID)
Influenza A by PCR: NEGATIVE
Influenza B by PCR: NEGATIVE
SARS Coronavirus 2 by RT PCR: NEGATIVE

## 2020-07-22 LAB — PROTIME-INR
INR: 1.2 (ref 0.8–1.2)
Prothrombin Time: 14.4 seconds (ref 11.4–15.2)

## 2020-07-22 SURGERY — REPAIR, PSEUDOANEURYSM
Anesthesia: General | Site: Groin | Laterality: Left

## 2020-07-22 MED ORDER — THROMBIN 5000 UNITS EX SOLR
CUTANEOUS | Status: DC | PRN
  Administered 2020-07-22: 5000 [IU] via TOPICAL

## 2020-07-22 MED ORDER — ALBUMIN HUMAN 5 % IV SOLN
INTRAVENOUS | Status: AC
  Filled 2020-07-22: qty 500

## 2020-07-22 MED ORDER — EPHEDRINE SULFATE 50 MG/ML IJ SOLN
INTRAMUSCULAR | Status: DC | PRN
  Administered 2020-07-22: 5 mg via INTRAVENOUS

## 2020-07-22 MED ORDER — ALBUTEROL SULFATE HFA 108 (90 BASE) MCG/ACT IN AERS
1.0000 | INHALATION_SPRAY | Freq: Four times a day (QID) | RESPIRATORY_TRACT | Status: DC | PRN
  Filled 2020-07-22: qty 6.7

## 2020-07-22 MED ORDER — ALBUMIN HUMAN 5 % IV SOLN: INTRAVENOUS | Status: DC | PRN

## 2020-07-22 MED ORDER — GLYCOPYRROLATE 0.2 MG/ML IJ SOLN
INTRAMUSCULAR | Status: DC | PRN
  Administered 2020-07-22 (×2): .2 mg via INTRAVENOUS

## 2020-07-22 MED ORDER — PHENYLEPHRINE HCL (PRESSORS) 10 MG/ML IV SOLN
INTRAVENOUS | Status: DC | PRN
  Administered 2020-07-22 (×3): 100 ug via INTRAVENOUS

## 2020-07-22 MED ORDER — DEXAMETHASONE SODIUM PHOSPHATE 10 MG/ML IJ SOLN
INTRAMUSCULAR | Status: DC | PRN
  Administered 2020-07-22: 10 mg via INTRAVENOUS

## 2020-07-22 MED ORDER — SODIUM CHLORIDE 0.9 % IV SOLN
INTRAVENOUS | Status: DC | PRN
  Administered 2020-07-22: 20 ug/min via INTRAVENOUS

## 2020-07-22 MED ORDER — MORPHINE SULFATE (PF) 2 MG/ML IV SOLN: 1.0000 mg | INTRAVENOUS | Status: DC | PRN

## 2020-07-22 MED ORDER — IPRATROPIUM-ALBUTEROL 0.5-2.5 (3) MG/3ML IN SOLN
3.0000 mL | RESPIRATORY_TRACT | Status: DC
  Administered 2020-07-22: 3 mL via RESPIRATORY_TRACT

## 2020-07-22 MED ORDER — SUGAMMADEX SODIUM 500 MG/5ML IV SOLN
INTRAVENOUS | Status: DC | PRN
  Administered 2020-07-22: 145.2 mg via INTRAVENOUS

## 2020-07-22 MED ORDER — ALBUTEROL SULFATE (2.5 MG/3ML) 0.083% IN NEBU: 2.5000 mg | INHALATION_SOLUTION | Freq: Four times a day (QID) | RESPIRATORY_TRACT | Status: DC | PRN

## 2020-07-22 MED ORDER — FENTANYL CITRATE (PF) 100 MCG/2ML IJ SOLN
INTRAMUSCULAR | Status: DC | PRN
  Administered 2020-07-22 (×2): 50 ug via INTRAVENOUS

## 2020-07-22 MED ORDER — LABETALOL HCL 5 MG/ML IV SOLN: 10.0000 mg | INTRAVENOUS | Status: DC | PRN

## 2020-07-22 MED ORDER — ALUM & MAG HYDROXIDE-SIMETH 200-200-20 MG/5ML PO SUSP: 15.0000 mL | ORAL | Status: DC | PRN

## 2020-07-22 MED ORDER — IPRATROPIUM-ALBUTEROL 0.5-2.5 (3) MG/3ML IN SOLN
RESPIRATORY_TRACT | Status: AC
  Filled 2020-07-22: qty 3

## 2020-07-22 MED ORDER — OXYCODONE-ACETAMINOPHEN 5-325 MG PO TABS
1.0000 | ORAL_TABLET | Freq: Four times a day (QID) | ORAL | Status: DC | PRN
  Administered 2020-07-23 (×2): 1 via ORAL
  Administered 2020-07-24: 2 via ORAL
  Administered 2020-07-24: 1 via ORAL
  Administered 2020-07-25 – 2020-07-26 (×3): 2 via ORAL
  Filled 2020-07-22: qty 2
  Filled 2020-07-22: qty 1
  Filled 2020-07-22: qty 2
  Filled 2020-07-22 (×3): qty 1
  Filled 2020-07-22 (×2): qty 2

## 2020-07-22 MED ORDER — ONDANSETRON HCL 4 MG/2ML IJ SOLN: 4.0000 mg | Freq: Once | INTRAMUSCULAR | Status: DC | PRN

## 2020-07-22 MED ORDER — LIDOCAINE HCL (PF) 1 % IJ SOLN
INTRAMUSCULAR | Status: AC
  Filled 2020-07-22: qty 30

## 2020-07-22 MED ORDER — PHENOL 1.4 % MT LIQD
1.0000 | OROMUCOSAL | Status: DC | PRN
  Filled 2020-07-22: qty 177

## 2020-07-22 MED ORDER — PANTOPRAZOLE SODIUM 40 MG PO TBEC
40.0000 mg | DELAYED_RELEASE_TABLET | Freq: Every day | ORAL | Status: DC
  Administered 2020-07-23 – 2020-07-26 (×4): 40 mg via ORAL
  Filled 2020-07-22 (×4): qty 1

## 2020-07-22 MED ORDER — SUCCINYLCHOLINE CHLORIDE 20 MG/ML IJ SOLN
INTRAMUSCULAR | Status: DC | PRN
  Administered 2020-07-22: 120 mg via INTRAVENOUS

## 2020-07-22 MED ORDER — GELATIN ABSORBABLE 100 CM EX MISC
CUTANEOUS | Status: AC
  Filled 2020-07-22: qty 1

## 2020-07-22 MED ORDER — FLUTICASONE FUROATE-VILANTEROL 100-25 MCG/INH IN AEPB
1.0000 | INHALATION_SPRAY | Freq: Every day | RESPIRATORY_TRACT | Status: DC
  Administered 2020-07-23 – 2020-07-26 (×4): 1 via RESPIRATORY_TRACT
  Filled 2020-07-22 (×2): qty 28

## 2020-07-22 MED ORDER — ROCURONIUM BROMIDE 100 MG/10ML IV SOLN
INTRAVENOUS | Status: DC | PRN
  Administered 2020-07-22: 20 mg via INTRAVENOUS
  Administered 2020-07-22: 50 mg via INTRAVENOUS
  Administered 2020-07-22: 10 mg via INTRAVENOUS
  Administered 2020-07-22: 20 mg via INTRAVENOUS

## 2020-07-22 MED ORDER — SODIUM CHLORIDE 0.9 % IV BOLUS
500.0000 mL | Freq: Once | INTRAVENOUS | Status: AC
  Administered 2020-07-22: 500 mL via INTRAVENOUS

## 2020-07-22 MED ORDER — UMECLIDINIUM BROMIDE 62.5 MCG/INH IN AEPB
1.0000 | INHALATION_SPRAY | Freq: Every day | RESPIRATORY_TRACT | Status: DC
  Administered 2020-07-23 – 2020-07-26 (×4): 1 via RESPIRATORY_TRACT
  Filled 2020-07-22 (×2): qty 7

## 2020-07-22 MED ORDER — VASOPRESSIN 20 UNIT/ML IV SOLN
INTRAVENOUS | Status: DC | PRN
  Administered 2020-07-22 (×4): 1 [IU] via INTRAVENOUS

## 2020-07-22 MED ORDER — ATORVASTATIN CALCIUM 20 MG PO TABS
80.0000 mg | ORAL_TABLET | Freq: Every day | ORAL | Status: DC
  Administered 2020-07-23 – 2020-07-25 (×3): 80 mg via ORAL
  Filled 2020-07-22 (×3): qty 4

## 2020-07-22 MED ORDER — SODIUM CHLORIDE 0.9 % IV SOLN: INTRAVENOUS | Status: DC

## 2020-07-22 MED ORDER — LACTATED RINGERS IV SOLN: INTRAVENOUS | Status: DC | PRN

## 2020-07-22 MED ORDER — MIDAZOLAM HCL 2 MG/2ML IJ SOLN
INTRAMUSCULAR | Status: AC
  Filled 2020-07-22: qty 2

## 2020-07-22 MED ORDER — METOPROLOL TARTRATE 5 MG/5ML IV SOLN: 2.0000 mg | INTRAVENOUS | Status: DC | PRN

## 2020-07-22 MED ORDER — POTASSIUM CHLORIDE CRYS ER 20 MEQ PO TBCR: 20.0000 meq | EXTENDED_RELEASE_TABLET | Freq: Once | ORAL | Status: DC

## 2020-07-22 MED ORDER — FLUTICASONE-UMECLIDIN-VILANT 100-62.5-25 MCG/INH IN AEPB: 1.0000 | INHALATION_SPRAY | RESPIRATORY_TRACT | Status: DC

## 2020-07-22 MED ORDER — FENTANYL CITRATE (PF) 100 MCG/2ML IJ SOLN: 25.0000 ug | INTRAMUSCULAR | Status: DC | PRN

## 2020-07-22 MED ORDER — LIDOCAINE HCL (CARDIAC) PF 100 MG/5ML IV SOSY
PREFILLED_SYRINGE | INTRAVENOUS | Status: DC | PRN
  Administered 2020-07-22: 100 mg via INTRATRACHEAL

## 2020-07-22 MED ORDER — HYDRALAZINE HCL 20 MG/ML IJ SOLN: 5.0000 mg | INTRAMUSCULAR | Status: DC | PRN

## 2020-07-22 MED ORDER — THROMBIN 5000 UNITS EX SOLR
CUTANEOUS | Status: AC
  Filled 2020-07-22: qty 5000

## 2020-07-22 MED ORDER — SODIUM CHLORIDE 0.9 % IV SOLN
INTRAVENOUS | Status: DC | PRN
  Administered 2020-07-22: 17:00:00 500 mL via INTRAMUSCULAR

## 2020-07-22 MED ORDER — ONDANSETRON HCL 4 MG/2ML IJ SOLN
4.0000 mg | Freq: Four times a day (QID) | INTRAMUSCULAR | Status: DC | PRN
  Administered 2020-07-22 – 2020-07-23 (×2): 4 mg via INTRAVENOUS
  Filled 2020-07-22: qty 2

## 2020-07-22 MED ORDER — LIDOCAINE HCL 1 % IJ SOLN
INTRAMUSCULAR | Status: DC | PRN
  Administered 2020-07-22: 30 mL

## 2020-07-22 MED ORDER — DEXMEDETOMIDINE HCL IN NACL 400 MCG/100ML IV SOLN
INTRAVENOUS | Status: DC | PRN
  Administered 2020-07-22: 8 ug via INTRAVENOUS

## 2020-07-22 MED ORDER — GUAIFENESIN-DM 100-10 MG/5ML PO SYRP
15.0000 mL | ORAL_SOLUTION | ORAL | Status: DC | PRN
  Administered 2020-07-23 – 2020-07-26 (×5): 15 mL via ORAL
  Filled 2020-07-22 (×5): qty 15

## 2020-07-22 MED ORDER — PROPOFOL 10 MG/ML IV BOLUS
INTRAVENOUS | Status: DC | PRN
  Administered 2020-07-22: 50 mg via INTRAVENOUS
  Administered 2020-07-22: 120 mg via INTRAVENOUS

## 2020-07-22 MED ORDER — CEFAZOLIN SODIUM-DEXTROSE 2-3 GM-%(50ML) IV SOLR
INTRAVENOUS | Status: DC | PRN
  Administered 2020-07-22: 2 g via INTRAVENOUS

## 2020-07-22 MED ORDER — FENTANYL CITRATE (PF) 100 MCG/2ML IJ SOLN
INTRAMUSCULAR | Status: AC
  Filled 2020-07-22: qty 2

## 2020-07-22 MED ORDER — GELATIN ABSORBABLE 12-7 MM EX MISC
CUTANEOUS | Status: DC | PRN
  Administered 2020-07-22: 1 via TOPICAL

## 2020-07-22 SURGICAL SUPPLY — 70 items
ADH SKN CLS APL DERMABOND .7 (GAUZE/BANDAGES/DRESSINGS) ×1
APPLIER CLIP 11 MED OPEN (CLIP)
APPLIER CLIP 9.375 SM OPEN (CLIP)
APR CLP MED 11 20 MLT OPN (CLIP)
APR CLP SM 9.3 20 MLT OPN (CLIP)
BAG DECANTER FOR FLEXI CONT (MISCELLANEOUS) ×3 IMPLANT
BLADE SURG 15 STRL LF DISP TIS (BLADE) ×1 IMPLANT
BLADE SURG 15 STRL SS (BLADE) ×3
BLADE SURG SZ11 CARB STEEL (BLADE) ×3 IMPLANT
BOOT SUTURE AID YELLOW STND (SUTURE) ×3 IMPLANT
CANISTER SUCT 1200ML W/VALVE (MISCELLANEOUS) ×1 IMPLANT
CATH EMBL 80X6FR 13.5 STRL (CATHETERS) IMPLANT
CATH EMBL 80X6FR 13.5STRL (CATHETERS) ×1
CATH EMBOLECTOMY 6X80 (CATHETERS) ×3
CLIP APPLIE 11 MED OPEN (CLIP) IMPLANT
CLIP APPLIE 9.375 SM OPEN (CLIP) IMPLANT
COVER WAND RF STERILE (DRAPES) ×3 IMPLANT
DERMABOND ADVANCED (GAUZE/BANDAGES/DRESSINGS) ×2
DERMABOND ADVANCED .7 DNX12 (GAUZE/BANDAGES/DRESSINGS) ×1 IMPLANT
DRAPE INCISE IOBAN 66X45 STRL (DRAPES) ×3 IMPLANT
DRESSING PREVENA PLUS CUSTOM (GAUZE/BANDAGES/DRESSINGS) IMPLANT
DRESSING SURGICEL FIBRLLR 1X2 (HEMOSTASIS) IMPLANT
DRSG PREVENA PLUS CUSTOM (GAUZE/BANDAGES/DRESSINGS) ×3
DRSG SURGICEL FIBRILLAR 1X2 (HEMOSTASIS) ×6
DRSG VAC ATS MED SENSATRAC (GAUZE/BANDAGES/DRESSINGS) ×2 IMPLANT
ELECT CAUTERY BLADE 6.4 (BLADE) ×3 IMPLANT
ELECT REM PT RETURN 9FT ADLT (ELECTROSURGICAL) ×3
ELECTRODE REM PT RTRN 9FT ADLT (ELECTROSURGICAL) ×1 IMPLANT
GLOVE BIO SURGEON STRL SZ7 (GLOVE) ×6 IMPLANT
GLOVE INDICATOR 7.5 STRL GRN (GLOVE) ×3 IMPLANT
GOWN STRL REUS W/ TWL LRG LVL3 (GOWN DISPOSABLE) ×2 IMPLANT
GOWN STRL REUS W/ TWL XL LVL3 (GOWN DISPOSABLE) ×1 IMPLANT
GOWN STRL REUS W/TWL LRG LVL3 (GOWN DISPOSABLE) ×6
GOWN STRL REUS W/TWL XL LVL3 (GOWN DISPOSABLE) ×3
IV NS 500ML (IV SOLUTION) ×3
IV NS 500ML BAXH (IV SOLUTION) ×1 IMPLANT
KIT PREVENA INCISION MGT 13 (CANNISTER) ×2 IMPLANT
KIT TURNOVER KIT A (KITS) ×3 IMPLANT
LABEL OR SOLS (LABEL) ×3 IMPLANT
LOOP RED MAXI  1X406MM (MISCELLANEOUS) ×2
LOOP VESSEL MAXI  1X406 RED (MISCELLANEOUS) ×1
LOOP VESSEL MAXI 1X406 RED (MISCELLANEOUS) ×3 IMPLANT
LOOP VESSEL MINI 0.8X406 BLUE (MISCELLANEOUS) ×1 IMPLANT
LOOPS BLUE MINI 0.8X406MM (MISCELLANEOUS) ×4
MANIFOLD NEPTUNE II (INSTRUMENTS) ×3 IMPLANT
NDL HYPO 25X1 1.5 SAFETY (NEEDLE) IMPLANT
NEEDLE HYPO 25X1 1.5 SAFETY (NEEDLE) IMPLANT
NS IRRIG 1000ML POUR BTL (IV SOLUTION) ×3 IMPLANT
PACK BASIN MAJOR ARMC (MISCELLANEOUS) ×3 IMPLANT
PACK UNIVERSAL (MISCELLANEOUS) ×3 IMPLANT
SPONGE LAP 18X18 RF (DISPOSABLE) ×2 IMPLANT
STAPLER SKIN PROX 35W (STAPLE) ×2 IMPLANT
SUT MNCRL 4-0 (SUTURE) ×3
SUT MNCRL 4-0 27XMFL (SUTURE) ×1
SUT PROLENE 5 0 RB 1 DA (SUTURE) ×4 IMPLANT
SUT PROLENE 6 0 BV (SUTURE) ×6 IMPLANT
SUT PROLENE 7 0 BV 1 (SUTURE) ×6 IMPLANT
SUT SILK 2 0 (SUTURE) ×3
SUT SILK 2-0 18XBRD TIE 12 (SUTURE) ×1 IMPLANT
SUT SILK 3 0 (SUTURE) ×3
SUT SILK 3-0 18XBRD TIE 12 (SUTURE) ×1 IMPLANT
SUT SILK 4 0 (SUTURE) ×3
SUT SILK 4-0 18XBRD TIE 12 (SUTURE) ×1 IMPLANT
SUT VIC AB 2-0 CT1 (SUTURE) ×9 IMPLANT
SUT VICRYL+ 3-0 36IN CT-1 (SUTURE) ×8 IMPLANT
SUTURE MNCRL 4-0 27XMF (SUTURE) ×1 IMPLANT
SYR 20ML LL LF (SYRINGE) ×3 IMPLANT
SYR 5ML LL (SYRINGE) ×3 IMPLANT
SYR BULB IRRIG 60ML STRL (SYRINGE) ×3 IMPLANT
TRAY FOLEY MTR SLVR 16FR STAT (SET/KITS/TRAYS/PACK) ×3 IMPLANT

## 2020-07-22 NOTE — ED Notes (Signed)
Pt transported to OR, pt belongings placed in bag, pt has wound vac in place. Cell phone placed in belongings bag and turned off. Pt has dentures but were still in mouth, denture cup given to take out when in OR.

## 2020-07-22 NOTE — Anesthesia Preprocedure Evaluation (Signed)
Anesthesia Evaluation  Patient identified by MRN, date of birth, ID band Patient awake    Reviewed: Allergy & Precautions, H&P , NPO status , Patient's Chart, lab work & pertinent test results, reviewed documented beta blocker date and time   Airway Mallampati: III  TM Distance: >3 FB Neck ROM: full    Dental  (+) Teeth Intact   Pulmonary COPD,  COPD inhaler, former smoker,    Pulmonary exam normal        Cardiovascular Exercise Tolerance: Poor + CAD, + Past MI, + Peripheral Vascular Disease and +CHF  Normal cardiovascular exam+ dysrhythmias Atrial Fibrillation  Rhythm:regular Rate:Normal     Neuro/Psych PSYCHIATRIC DISORDERS Anxiety Depression negative neurological ROS     GI/Hepatic negative GI ROS, (+)     substance abuse  alcohol use,   Endo/Other  negative endocrine ROS  Renal/GU negative Renal ROS  negative genitourinary   Musculoskeletal   Abdominal   Peds negative pediatric ROS (+)  Hematology negative hematology ROS (+)   Anesthesia Other Findings Past Medical History: No date: Alcohol abuse No date: Amaurosis fugax No date: Atrial fibrillation (HCC) No date: Carotid stenosis No date: CHF (congestive heart failure) (HCC) No date: Coagulopathy (HCC) No date: COPD (chronic obstructive pulmonary disease) (HCC) No date: Coronary artery disease No date: Depression No date: Hyperlipemia No date: Hypotension No date: Leucocytosis No date: Liver dysfunction No date: Myocardial infarction (HCC)     Comment:  non-STEMI. approx 2016 No date: Peripheral artery disease (HCC)     Comment:  legs No date: Tobacco abuse No date: Wears dentures     Comment:  full upper and lower Past Surgical History: 07/10/2015: CARDIAC CATHETERIZATION; N/A     Comment:  Procedure: Right/Left Heart Cath and Coronary               Angiography;  Surgeon: Alwyn Pea, MD;  Location:               [facility] INV[facility] CV [facility];   Service: Cardiovascular;                Laterality: N/A; 07/13/2015: CORONARY ARTERY BYPASS GRAFT     Comment:  [facility].  4 vessel 01/23/2020: LEFT HEART CATH AND CORS/GRAFTS ANGIOGRAPHY; N/A     Comment:  Procedure: LEFT HEART CATH AND CORS/GRAFTS ANGIOGRAPHY;               Surgeon: Alwyn Pea, MD;  Location: [facility] INV[facility]              CV LAB;  Service: Cardiovascular;  Laterality: N/A; 04/03/2020: LOWER EXTREMITY ANGIOGRAPHY; Right     Comment:  Procedure: LOWER EXTREMITY ANGIOGRAPHY;  Surgeon:               Renford Dills, MD;  Location: [facility] INV[facility] CV [facility];               Service: Cardiovascular;  Laterality: Right; 05/06/2019: OLECRANON BURSECTOMY; Left     Comment:  Procedure: OLECRANON BURSECTOMY AND DEBRIDEMENT;                Surgeon: Signa Kell, MD;  Location: [facility] ORS;  Service:              Orthopedics;  Laterality: Left; BMI    Body Mass Index: 26.00 kg/m     Reproductive/Obstetrics negative OB ROS  Anesthesia Physical  Anesthesia Plan  ASA: III  Anesthesia Plan: General   Post-op Pain Management:    Induction: Intravenous, Rapid sequence and Cricoid pressure planned  PONV Risk Score and Plan:   Airway Management Planned: Oral ETT  Additional Equipment:   Intra-op Plan:   Post-operative Plan: Extubation in OR  Informed Consent: I have reviewed the patients History and Physical, chart, labs and discussed the procedure including the risks, benefits and alternatives for the proposed anesthesia with the patient or authorized representative who has indicated his/her understanding and acceptance.     Dental advisory given  Plan Discussed with: CRNA and Surgeon  Anesthesia Plan Comments:         Anesthesia Quick Evaluation

## 2020-07-22 NOTE — ED Provider Notes (Signed)
[facility] Emergency Department Provider Note   ____________________________________________    I have reviewed the triage vital signs and the nursing notes.   HISTORY  Chief Complaint Weakness and Post-op Problem     HPI Dennis Preston is a 69 y.o. male with significant past medical history detailed below who presents with complaints of bleeding from surgical site in the left groin.  Patient had endarterectomy at the end of September, complicated by infection on the right surgical site, he does have a wound VAC in place there.  Noted two small pinpoint areas of bleeding at the top of the left incision which is overall well-healed.  No pain in the area.  This started last night.  He does have an appointment tomorrow for follow-up with vascular surgery  Past Medical History:  Diagnosis Date  . Alcohol abuse   . Amaurosis fugax   . Atrial fibrillation (HCC)   . Carotid stenosis   . CHF (congestive heart failure) (HCC)   . Coagulopathy (HCC)   . COPD (chronic obstructive pulmonary disease) (HCC)   . Coronary artery disease   . Depression   . Hyperlipemia   . Hypotension   . Leucocytosis   . Liver dysfunction   . Myocardial infarction (HCC)    non-STEMI. approx 2016  . Peripheral artery disease (HCC)    legs  . Tobacco abuse   . Wears dentures    full upper and lower    Patient Active Problem List   Diagnosis Date Noted  . Atherosclerosis of artery of extremity with rest pain (HCC) 05/23/2020  . Chronic venous insufficiency 03/01/2020  . COPD exacerbation (HCC) 02/13/2020  . Aortic atherosclerosis (HCC) 08/08/2019  . COPD (chronic obstructive pulmonary disease) with chronic bronchitis (HCC) 08/08/2019  . COPD with acute exacerbation (HCC) 06/13/2018  . Tobacco use 01/14/2017  . A-fib (HCC) 07/18/2015  . Coagulopathy (HCC) 07/17/2015  . Hypotension 07/14/2015  . Leukocytosis 07/14/2015  . PAD (peripheral artery disease) (HCC)  07/14/2015  . S/P coronary artery bypass graft x 4 07/14/2015  . Carotid atherosclerosis, bilateral 07/12/2015  . Transient visual loss of right eye 07/11/2015  . NSTEMI (non-ST elevated myocardial infarction) (HCC) 07/10/2015  . Amaurosis fugax 07/10/2015  . Coronary artery disease 07/10/2015  . Hypoxia 07/09/2015  . ANXIETY 02/24/2007  . ERECTILE DYSFUNCTION 02/24/2007  . LIVER FUNCTION TESTS, ABNORMAL 02/24/2007    Past Surgical History:  Procedure Laterality Date  . APPLICATION OF WOUND VAC Right 06/22/2020   Procedure: APPLICATION OF WOUND VAC;  Surgeon: Annice Needy, MD;  Location: [facility] ORS;  Service: Vascular;  Laterality: Right;  . CARDIAC CATHETERIZATION N/A 07/10/2015   Procedure: Right/Left Heart Cath and Coronary Angiography;  Surgeon: Alwyn Pea, MD;  Location: [facility] INV[facility]IVE [facility];  Service: Cardiovascular;  Laterality: N/A;  . CORONARY ARTERY BYPASS GRAFT  07/13/2015   [facility].  4 vessel  . ENDARTERECTOMY FEMORAL Bilateral 05/23/2020   Procedure: ENDARTERECTOMY FEMORAL BILATERAL SUPERFICIAL FEMORAL ARTERY STENTS ;  Surgeon: Renford Dills, MD;  Location: [facility] ORS;  Service: Vascular;  Laterality: Bilateral;  . INSERTION OF ILIAC STENT Left 05/23/2020   Procedure: INSERTION OF ILIAC STENT;  Surgeon: Renford Dills, MD;  Location: [facility] ORS;  Service: Vascular;  Laterality: Left;  . LEFT HEART CATH AND CORS/GRAFTS ANGIOGRAPHY N/A 01/23/2020   Procedure: LEFT HEART CATH AND CORS/GRAFTS ANGIOGRAPHY;  Surgeon: Alwyn Pea, MD;  Location: [facility];  Service: Cardiovascular;  Laterality: N/A;  .  LOWER EXTREMITY ANGIOGRAPHY Right 04/03/2020   Procedure: LOWER EXTREMITY ANGIOGRAPHY;  Surgeon: Renford Dills, MD;  Location: [facility];  Service: Cardiovascular;  Laterality: Right;  . OLECRANON BURSECTOMY Left 05/06/2019   Procedure: OLECRANON BURSECTOMY AND DEBRIDEMENT;  Surgeon: Signa Kell, MD;  Location: [facility] ORS;  Service: Orthopedics;   Laterality: Left;  . WOUND DEBRIDEMENT Right 06/22/2020   Procedure: DEBRIDEMENT WOUND RIGHT GROIN;  Surgeon: Annice Needy, MD;  Location: [facility] ORS;  Service: Vascular;  Laterality: Right;    Prior to Admission medications   Medication Sig Start Date End Date Taking? Authorizing Provider  acetaminophen (TYLENOL) 325 MG tablet Take 650 mg by mouth every 6 (six) hours as needed for mild pain or headache.     [provider]  albuterol (PROVENTIL) (2.5 MG/3ML) 0.083% nebulizer solution Take 3 mLs (2.5 mg total) by nebulization every 6 (six) hours as needed for wheezing or shortness of breath. 02/15/20   Lurene Shadow, MD  albuterol (VENTOLIN HFA) 108 (90 Base) MCG/ACT inhaler Inhale 1-2 puffs into the lungs every 6 (six) hours as needed for wheezing or shortness of breath. 02/15/20   Lurene Shadow, MD  amoxicillin-clavulanate (AUGMENTIN) 875-125 MG tablet Take 1 tablet by mouth 2 (two) times daily. Patient not taking: Reported on 06/20/2020 06/11/20   Georgiana Spinner, NP  aspirin EC 81 MG tablet Take 1 tablet (81 mg total) by mouth daily. Swallow whole. Patient not taking: Reported on 05/16/2020 04/03/20 04/03/21  Schnier, Latina Craver, MD  atorvastatin (LIPITOR) 80 MG tablet Take 1 tablet (80 mg total) by mouth daily at 6 PM. 07/10/15   Houston Siren, MD  clopidogrel (PLAVIX) 75 MG tablet Take 75 mg by mouth every evening.  04/18/16   [provider]  docusate sodium (COLACE) 100 MG capsule Take 1 capsule (100 mg total) by mouth 2 (two) times daily. 05/28/20   Stegmayer, Ranae Plumber, PA-C  Fluticasone-Umeclidin-Vilant (TRELEGY ELLIPTA) 100-62.5-25 MCG/INH AEPB Inhale 1 puff into the lungs every other day.  01/27/20   [provider]  furosemide (LASIX) 40 MG tablet Take 40 mg by mouth daily.  12/26/19   [provider]  oxyCODONE-acetaminophen (PERCOCET/ROXICET) 5-325 MG tablet Take 1-2 tablets by mouth every 6 (six) hours as needed for moderate pain or severe pain. 05/28/20    Stegmayer, Cala Bradford A, PA-C  senna-docusate (SENOKOT-S) 8.6-50 MG tablet Take 1 tablet by mouth at bedtime as needed for mild constipation. 05/28/20   Stegmayer, Ranae Plumber, PA-C     Allergies Patient has no known allergies.  Family History  Problem Relation Age of Onset  . Depression Sister     Social History Social History   Tobacco Use  . Smoking status: Former Smoker    Packs/day: 1.00    Years: 54.00    Pack years: 54.00    Types: Cigarettes    Quit date: 03/16/2020    Years since quitting: 0.3  . Smokeless tobacco: Never Used  . Tobacco comment: was 2.5 PPD for 50 yrs until 2016, quit a "few days ago"  Vaping Use  . Vaping Use: Never used  Substance Use Topics  . Alcohol use: Not Currently    Comment: quit over 15 yrs ago  . Drug use: Never    Review of Systems  Constitutional: No fever/chills Eyes: No visual changes.  ENT: No sore throat. Cardiovascular: Denies chest pain. Respiratory: Denies shortness of breath. Gastrointestinal: No abdominal pain.  No nausea, no vomiting.   Genitourinary: Negative  for dysuria. Musculoskeletal: As above Skin: No evidence of infection Neurological: Negative for headaches or weakness   ____________________________________________   PHYSICAL EXAM:  VITAL SIGNS: ED Triage Vitals  Enc Vitals Group     BP 07/22/20 1226 (!) 155/60     Pulse Rate 07/22/20 1226 79     Resp 07/22/20 1226 16     Temp 07/22/20 1226 98.1 F (36.7 C)     Temp Source 07/22/20 1226 Oral     SpO2 07/22/20 1226 94 %     Weight 07/22/20 1223 72.6 kg (160 lb)     Height 07/22/20 1223 1.702 m (5\' 7" )     Head Circumference --      Peak Flow --      Pain Score 07/22/20 1223 0     Pain Loc --      Pain Edu? --      Excl. in GC? --     Constitutional: Alert and oriented. No acute distress.   Nose: No congestion/rhinnorhea. Mouth/Throat: Mucous membranes are moist.   Neck:  Painless ROM Cardiovascular: Normal rate, regular rhythm. Grossly  normal heart sounds.  Good peripheral circulation. Respiratory: Normal respiratory effort.  No retractions.  Gastrointestinal: Soft and nontender. No distention.  No CVA tenderness. Genitourinary: deferred Musculoskeletal: Left groin:two small pinpoint areas of bleeding at the top of the left incision which is overall well-healed, area surrounding incision is more firm than expected, question hematoma Neurologic:  Normal speech and language. No gross focal neurologic deficits are appreciated.  Skin:  Skin is warm, dry Psychiatric: Mood and affect are normal. Speech and behavior are normal.  ____________________________________________   LABS (all labs ordered are listed, but only abnormal results are displayed)  Labs Reviewed  BASIC METABOLIC PANEL - Abnormal; Notable for the following components:      Result Value   Glucose, Bld 161 (*)    All other components within normal limits  RESPIRATORY PANEL BY RT PCR (FLU A&B, COVID)  CBC   ____________________________________________  EKG  ED ECG REPORT I, 07/24/20, the attending physician, personally viewed and interpreted this ECG.  Date: 07/22/2020  Rhythm: normal sinus rhythm QRS Axis: normal Intervals: normal ST/T Wave abnormalities: Nonspecific changes Narrative Interpretation: no evidence of acute ischemia  ____________________________________________  RADIOLOGY  Ultrasound reviewed by me, concerning for pseudoaneurysm ____________________________________________   PROCEDURES  Procedure(s) performed: No  Procedures   Critical Care performed: No ____________________________________________   INITIAL IMPRESSION / ASSESSMENT AND PLAN / ED COURSE  Pertinent labs & imaging results that were available during my care of the patient were reviewed by me and considered in my medical decision making (see chart for details).   Patient presents with area of bleeding at site of endarterectomy, 2 pinpoint areas,  area of firmness underneath wound, concerning for hematoma versus pseudoaneurysm.  Patient seen by Dr. 07/24/2020 of vascular surgery who recommends ultrasound to evaluate for pseudoaneurysm.  If pseudoaneurysm will require admission, if old hematoma appropriate for discharge with follow-up, patient has appointment tomorrow.  Notified by ultrasound of large pseudoaneurysm, discussed with Dr. Evie Lacks who who will admit, wants to hold aspirin and Plavix and keep n.p.o.   ____________________________________________   FINAL CLINICAL IMPRESSION(S) / ED DIAGNOSES  Final diagnoses:  Pseudoaneurysm (HCC)        Note:  This document was prepared using Dragon voice recognition software and may include unintentional dictation errors.   Evie Lacks, MD 07/22/20 [phone]

## 2020-07-22 NOTE — Op Note (Signed)
OPERATIVE NOTE   PROCEDURE: 1. Left common femoral ruptured pseudoaneurysm repair. Right wound VAC change   PRE-OPERATIVE DIAGNOSIS: LEFT common femoral artery ruptured pseudoaneurysm POST-OPERATIVE DIAGNOSIS: Same  SURGEON: Stevin Bielinski A, MD  ANESTHESIA: general  ESTIMATED BLOOD LOSS: 350 cc  FINDING(S): 1. 5cm ruptured Left Common femoral artery pseudoaneurysm  SPECIMEN(S): Left common femoral artery pseudoaneurysm capsule  INDICATIONS:  Patient presents with ruptured  Left femoral artery pseudoaneurysm. Repair is planned to prevent hemorrhage and limb loss The risks and benefits as well as alternative therapies including intervention were reviewed in detail all questions were answered the patient agrees to proceed with surgery.  DESCRIPTION: After obtaining full informed written consent, the patient was brought back to the operating room and placed supine upon the operating table. The patient received IV antibiotics prior to induction. After obtaining adequate anesthesia, the patient was prepped and draped in the standard fashion appropriate time out is called.   Vertical incision was created proximal to the pinpoint skin defects over the left femoral arteries. This was carried down with electrocautery. The pseudoaneurysm was very proximal and impinged on the inguinal ligament. Proximal control was difficult to obtain of the external iliac artery. The SFA was dissected out with Metzenbaum scissors, distal to the aneurysm. This was encircled with a vessel loop.  The pseudoaneurysm was dissected out laterally, superior and inferiorly.  There was a hole in the pseudoaneurysm that communicated with the skin. The pseudoaneurysm capsule was opened using heavy curved Mayo scissors and thrombus was evacuated. Digital control was used to obtain control of the site of bleeding in the common femoral artery. A 5-0 prolene was used in a U-stitch to obtain control. Hemostasis was  obtained. The capsule of the pseudoaneurysm was resected and sent for specimen. The wound was copiously irrigated with saline. Hemostasis in the wound was ensured. Fibrilar was placed over the CFA and SFA and wound bed. 1 % lidocaine was used in the soft tissue and skin. The wound was closed in several layers using a 2-0 vicryl; 3-0 vicryl. The skin was approximated with 3-0 nylon mattress and staples. A Prevena was placed over the incision.  The RIGHT groin wound VAC was changed. The wound bed was clean with progressive granulation tissue at the base. A good seal/suction was obtained.  The patient was then awakened from anesthesia and taken to the recovery room in stable condition having tolerated the procedure well.  COMPLICATIONS: None  CONDITION: Stable     Vipul Cafarelli A 07/22/2020 7:34 PM   This note was created with Dragon Medical transcription system. Any errors in dictation are purely unintentional.

## 2020-07-22 NOTE — Anesthesia Procedure Notes (Signed)
Procedure Name: Intubation Performed by: Fletcher-Harrison, Jayvon Mounger, CRNA Pre-anesthesia Checklist: Patient identified, Emergency Drugs available, Suction available and Patient being monitored Patient Re-evaluated:Patient Re-evaluated prior to induction Oxygen Delivery Method: Circle system utilized Preoxygenation: Pre-oxygenation with 100% oxygen Induction Type: IV induction Ventilation: Mask ventilation without difficulty Laryngoscope Size: McGraph and 3 Grade View: Grade I Tube type: Oral Number of attempts: 1 Airway Equipment and Method: Stylet and Oral airway Placement Confirmation: ETT inserted through vocal cords under direct vision,  positive ETCO2,  breath sounds checked- equal and bilateral and CO2 detector Secured at: 21 cm Tube secured with: Tape Dental Injury: Teeth and Oropharynx as per pre-operative assessment        

## 2020-07-22 NOTE — H&P (Signed)
[facility] History & Physical Update  The patient was interviewed and re-examined.  Duplex of LEFT femoral artery reviewed. Large pseudoaneurysm with bleeding to skin.  Will take emergently to OR for repair and all associated procedures. Extensive discussion with patient and wife. Risks, benefits and alternatives explained. All questions answered. Consent obtained. Bertram Denver, MD  07/22/2020, 3:42 PM

## 2020-07-22 NOTE — ED Notes (Addendum)
Consent signed by patient and placed on chart. NO orders for consent, however, surgeon was in room and filled out consent and had RN witness at bedside.

## 2020-07-22 NOTE — ED Triage Notes (Signed)
Pt states he had surgery on his legs with incisions in his groin bilaterally- pt states the incision on the right side looks like it has come open and began bleeding last night- pt states he takes plavix and ASA- pt states he saturated multiple gauze- pt has it covered with blood on the bandage- pt states today he started feeling weak and dizzy and wanted to have it looked at

## 2020-07-22 NOTE — Transfer of Care (Signed)
Immediate Anesthesia Transfer of Care Note  Patient: Dennis Preston  Procedure(s) Performed: REPAIR LEFT FEMORAL PSEUDOANUERYSM (Left Groin)  Patient Location: PACU  Anesthesia Type:General  Level of Consciousness: awake, alert  and oriented  Airway & Oxygen Therapy: Patient Spontanous Breathing and Patient connected to face mask oxygen  Post-op Assessment: Report given to RN and Post -op Vital signs reviewed and stable  Post vital signs: Reviewed and stable  Last Vitals:  Vitals Value Taken Time  BP 93/61 07/22/20 1926  Temp    Pulse 70 07/22/20 1926  Resp 15 07/22/20 1926  SpO2 100 % 07/22/20 1926  Vitals shown include unvalidated device data.  Last Pain:  Vitals:   07/22/20 1916  TempSrc:   PainSc: (P) 0-No pain         Complications: No complications documented.

## 2020-07-22 NOTE — Consult Note (Signed)
NAME:  Dennis Preston, MRN:  [id number], DOB:  May 31, 1951, LOS: 0 ADMISSION DATE:  07/22/2020, CONSULTATION DATE:  07/22/2020 REFERRING MD:  Dr. Evie Lacks, CHIEF COMPLAINT:  Ruptured Left Femoral Artery Pseudoaneurysm  Brief History   69 y.o. male admitted with Ruptured Left Common Femoral Artery Pseudoaneurysm s/p Repair on 07/22/20.  Admitted to ICU post procedure.  PCCM consulted for medical management while in ICU.  History of present illness   Dennis Preston is a 69 y.o. Male with a past medical history as listed below who presents to [facility] ED on 07/22/2020 due to complaints of weakness and bleeding from previous left femoral endarterectomy site.  On 05/23/2020 he underwent bilateral femoral endarterectomies.  The right groin has been progressing with a wound VAC in place, and the left groin had been healing without complaint.  However last night on 07/21/20, he noted bleeding from the left groin at a small pinpoint opening as the proximal edge of the incision, and again today his wife noted continued dark bleeding that saturated several bandages.  He also reported associated lightheadedness.  He denied chest pain, SOB, groin or leg pain, claudication or rest pain.  He reports he has been compliant with ASA/Plavix.  Upon presentation to the ED, vital signs were as follows: Temperature 98.1 oral, RR 16, HR 79, and BP 155/60.  Lab work is unremarkable, and his COVID-19 PCR and Influenza PCR are both negative.  Left Femoral Arterial Duplex was concerning for 5 cm Ruptured Pseudoaneurysm of the Left Common Femoral Artery.  Vascular Surgery was consulted, and took him emergently to the OR for repair.  Repair was successful, with estimated blood loss of 350 cc.  He returns to ICU post procedure, and is hemodynamically stable.  PCCM is consulted for medical management while in the ICU.  Past Medical History  Stage IV COPD CHF Paroxsymal Atrial Fibrillation Peripheral Artery Disease Coronary Artery  Disease s/p CABG Cartotid Stenosis Tobacco abuse Hyperlipidemia Hypertension  Significant Hospital Events   10/24: S/p repair of Ruptured Left Femoral Pseudoanyrysm; returns to ICU post procedure  Consults:  Vascular Surgery (Primary Service) PCCM  Procedures:  10/24: Repair of Left Common Femoral Artery Pseudoayersysm  Significant Diagnostic Tests:  10/24: Left Arterial US LE>> 4.9 x 4.4 cm pseudoaneurysm is noted arising from the left common femoral artery. Doppler does demonstrate significant flow within the pseudoaneurysm, although some mural thrombus is noted. Narrow neck of the pseudoaneurysm is noted. Vascular surgery consult is recommended.  Micro Data:  SARS-CoV-2 PCR 10/24>>negative Influenza PCR 10/24>> negative  Antimicrobials:  N/A  Interim history/subjective:  Admitted to ICU post op Hemodynamically stable with Right radial arterial line in place On 2L supplemental O2 via [place] Denies chest pain, SOB, wheezing, abdominal pain, N/V/D, edema, fever/chills, dysuria   Objective   Blood pressure (!) 174/70, pulse 74, temperature (!) 97.2 F (36.2 C), resp. rate 19, height 5\' 7"  (1.702 m), weight 72.6 kg, SpO2 98 %.        Intake/Output Summary (Last 24 hours) at 07/22/2020 2002 Last data filed at 07/22/2020 1957 Gross per 24 hour  Intake 2650 ml  Output 680 ml  Net 1970 ml   Filed Weights   07/22/20 1223  Weight: 72.6 kg    Examination: General: Acute on chronically ill appearing male, laying in bed, on 2L [place], in NAD HENT: Atraumatic, normocephalic, neck supple, no JVD Lungs: Clear to auscultation throughout, no wheezing or rales, even, nonlabored Cardiovascular: Regular rate and rhythm, s1s2, no M/R/G  Abdomen: Soft, nontender, nondistended, no guarding or rebound tenderness, BS+ x4 Extremities: No deformities, no edema.  Normal bulk and tone Neuro: Awake, A&O x4, follows commands, no focal deficits, speech clear  Resolved Hospital Problem list     N/A  Assessment & Plan:   Ruptured Left Common Femoral Artery Pseudoaneurysm s/p Repair on 07/22/20 Hx: PAD, Carotid Stenosis, CAD s/p CABG -Vascular Surgery following, appreciate input, will follow recommendations -Wound care and site assessment as per Vascular Surgery -Pain control -Maintain SBP <160; Prn Hydralazine & Labetalol -Monitor for S/Sx of bleeding -Trend CBC -SCD's for VTE Prophylaxis  -Transfuse for Hgb <8 -Hold home Aspirin & Plavix for now   COPD w/o Acute Exacerbation Hx: Stage IV COPD (follows with Dr. Meredeth Ide, occasionally uses 2L O2 with ambulation) -Supplemental O2 as needed to maintain O2 sats 88 to 92% -Follow intermittent CXR & ABG as needed -Continue home Bronchodilators & Trelegy Ellipta -Antitussives   Best practice:  Diet: As tolerated Pain/Anxiety/Delirium protocol (if indicated): Morphine, Percocet VAP protocol (if indicated): N/A DVT prophylaxis: SCD's GI prophylaxis: Protonix Glucose control: SSI Mobility: As tolerated as per Vascular Surgery recommendations Code Status: Full Code Family Communication: Updated pt and his wife at bedside 07/22/20 Disposition: ICU  Labs   CBC: Recent Labs  Lab 07/22/20 1235  WBC 9.0  HGB 14.4  HCT 43.9  MCV 88.9  PLT 218    Basic Metabolic Panel: Recent Labs  Lab 07/22/20 1235  NA 137  K 3.6  CL 99  CO2 28  GLUCOSE 161*  BUN 11  CREATININE 1.02  CALCIUM 9.1   GFR: Estimated Creatinine Clearance: 64.8 mL/min (by C-G formula based on SCr of 1.02 mg/dL). Recent Labs  Lab 07/22/20 1235  WBC 9.0    Liver Function Tests: No results for input(s): AST, ALT, ALKPHOS, BILITOT, PROT, ALBUMIN in the last 168 hours. No results for input(s): LIPASE, AMYLASE in the last 168 hours. No results for input(s): AMMONIA in the last 168 hours.  ABG    Component Value Date/Time   HCO3 37.1 (H) 02/13/2020 1315   O2SAT 31.9 02/13/2020 1315     Coagulation Profile: No results for input(s): INR,  PROTIME in the last 168 hours.  Cardiac Enzymes: No results for input(s): CKTOTAL, CKMB, CKMBINDEX, TROPONINI in the last 168 hours.  HbA1C: Hgb A1c MFr Bld  Date/Time Value Ref Range Status  08/27/2006 12:00 AM 5.8 %     CBG: No results for input(s): GLUCAP in the last 168 hours.  Review of Systems:   Positives in BOLD: Pt denies all complaints Gen: Denies fever, chills, weight change, fatigue, night sweats HEENT: Denies blurred vision, double vision, hearing loss, tinnitus, sinus congestion, rhinorrhea, sore throat, neck stiffness, dysphagia PULM: Denies shortness of breath, cough, sputum production, hemoptysis, wheezing CV: Denies chest pain, edema, orthopnea, paroxysmal nocturnal dyspnea, palpitations GI: Denies abdominal pain, nausea, vomiting, diarrhea, hematochezia, melena, constipation, change in bowel habits GU: Denies dysuria, hematuria, polyuria, oliguria, urethral discharge Endocrine: Denies hot or cold intolerance, polyuria, polyphagia or appetite change Derm: Denies rash, dry skin, scaling or peeling skin change Heme: Denies easy bruising, bleeding, bleeding gums Neuro: Denies headache, numbness, weakness, slurred speech, loss of memory or consciousness   Past Medical History  He,  has a past medical history of Alcohol abuse, Amaurosis fugax, Atrial fibrillation (HCC), Carotid stenosis, CHF (congestive heart failure) (HCC), Coagulopathy (HCC), COPD (chronic obstructive pulmonary disease) (HCC), Coronary artery disease, Depression, Hyperlipemia, Hypotension, Leucocytosis, Liver dysfunction, Myocardial infarction [facility]), Peripheral artery  disease (HCC), Tobacco abuse, and Wears dentures.   Surgical History    Past Surgical History:  Procedure Laterality Date  . APPLICATION OF WOUND VAC Right 06/22/2020   Procedure: APPLICATION OF WOUND VAC;  Surgeon: Annice Needyew, Jason S, MD;  Location: [facility] ORS;  Service: Vascular;  Laterality: Right;  . CARDIAC CATHETERIZATION N/A 07/10/2015     Procedure: Right/Left Heart Cath and Coronary Angiography;  Surgeon: Alwyn Peawayne D Callwood, MD;  Location: [facility] INVASIVE CV LAB;  Service: Cardiovascular;  Laterality: N/A;  . CORONARY ARTERY BYPASS GRAFT  07/13/2015   [facility].  4 vessel  . ENDARTERECTOMY FEMORAL Bilateral 05/23/2020   Procedure: ENDARTERECTOMY FEMORAL BILATERAL SUPERFICIAL FEMORAL ARTERY STENTS ;  Surgeon: Renford DillsSchnier, Gregory G, MD;  Location: [facility] ORS;  Service: Vascular;  Laterality: Bilateral;  . INSERTION OF ILIAC STENT Left 05/23/2020   Procedure: INSERTION OF ILIAC STENT;  Surgeon: Renford DillsSchnier, Gregory G, MD;  Location: [facility] ORS;  Service: Vascular;  Laterality: Left;  . LEFT HEART CATH AND CORS/GRAFTS ANGIOGRAPHY N/A 01/23/2020   Procedure: LEFT HEART CATH AND CORS/GRAFTS ANGIOGRAPHY;  Surgeon: Alwyn Peaallwood, Dwayne D, MD;  Location: [facility] INVASIVE CV LAB;  Service: Cardiovascular;  Laterality: N/A;  . LOWER EXTREMITY ANGIOGRAPHY Right 04/03/2020   Procedure: LOWER EXTREMITY ANGIOGRAPHY;  Surgeon: Renford DillsSchnier, Gregory G, MD;  Location: [facility] INVASIVE CV LAB;  Service: Cardiovascular;  Laterality: Right;  . OLECRANON BURSECTOMY Left 05/06/2019   Procedure: OLECRANON BURSECTOMY AND DEBRIDEMENT;  Surgeon: Signa KellPatel, Sunny, MD;  Location: [facility] ORS;  Service: Orthopedics;  Laterality: Left;  . WOUND DEBRIDEMENT Right 06/22/2020   Procedure: DEBRIDEMENT WOUND RIGHT GROIN;  Surgeon: Annice Needyew, Jason S, MD;  Location: [facility] ORS;  Service: Vascular;  Laterality: Right;     Social History   reports that he quit smoking about 4 months ago. His smoking use included cigarettes. He has a 54.00 pack-year smoking history. He has never used smokeless tobacco. He reports previous alcohol use. He reports that he does not use drugs.   Family History   His family history includes Depression in his sister.   Allergies No Known Allergies   Home Medications  Prior to Admission medications   Medication Sig Start Date End Date Taking? Authorizing Provider  acetaminophen  (TYLENOL) 325 MG tablet Take 650 mg by mouth every 6 (six) hours as needed for mild pain or headache.     [provider]  albuterol (PROVENTIL) (2.5 MG/3ML) 0.083% nebulizer solution Take 3 mLs (2.5 mg total) by nebulization every 6 (six) hours as needed for wheezing or shortness of breath. 02/15/20   Lurene ShadowAyiku, Bernard, MD  albuterol (VENTOLIN HFA) 108 (90 Base) MCG/ACT inhaler Inhale 1-2 puffs into the lungs every 6 (six) hours as needed for wheezing or shortness of breath. 02/15/20   Lurene ShadowAyiku, Bernard, MD  amoxicillin-clavulanate (AUGMENTIN) 875-125 MG tablet Take 1 tablet by mouth 2 (two) times daily. Patient not taking: Reported on 06/20/2020 06/11/20   Georgiana SpinnerBrown, Fallon E, NP  aspirin EC 81 MG tablet Take 1 tablet (81 mg total) by mouth daily. Swallow whole. Patient not taking: Reported on 05/16/2020 04/03/20 04/03/21  Schnier, Latina CraverGregory G, MD  atorvastatin (LIPITOR) 80 MG tablet Take 1 tablet (80 mg total) by mouth daily at 6 PM. 07/10/15   Houston SirenSainani, Vivek J, MD  clopidogrel (PLAVIX) 75 MG tablet Take 75 mg by mouth every evening.  04/18/16   [provider]  docusate sodium (COLACE) 100 MG capsule Take 1 capsule (100 mg total) by mouth 2 (two) times daily. 05/28/20  Stegmayer, Ranae Plumber, PA-C  Fluticasone-Umeclidin-Vilant (TRELEGY ELLIPTA) 100-62.5-25 MCG/INH AEPB Inhale 1 puff into the lungs every other day.  01/27/20   [provider]  furosemide (LASIX) 40 MG tablet Take 40 mg by mouth daily.  12/26/19   [provider]  oxyCODONE-acetaminophen (PERCOCET/ROXICET) 5-325 MG tablet Take 1-2 tablets by mouth every 6 (six) hours as needed for moderate pain or severe pain. 05/28/20   Stegmayer, Cala Bradford A, PA-C  senna-docusate (SENOKOT-S) 8.6-50 MG tablet Take 1 tablet by mouth at bedtime as needed for mild constipation. 05/28/20   Stegmayer, Ranae Plumber, PA-C     Critical care time: 40 minutes       Harlon Ditty, [facility] [place] [facility] Pager:  [phone]

## 2020-07-22 NOTE — Consult Note (Signed)
[facility] Vascular Consult Note  MRN : [id number]  Dennis Preston is a 69 y.o. (1951-01-29) male who presents with chief complaint of  Chief Complaint  Patient presents with  . Weakness  . Post-op Problem  .  History of Present Illness: patient known to service.  S/p On 05/23/20 the patient underwent: 1. Left common femoral, profunda femoris, and superficial femoral artery endarterectomies 2. Right common femoral, profunda femoris, and superficial femoral artery endarterectomies 3.Left lower extremity angiogram including left iliofemoral angiogram 4.Stent placement to the left external iliac artery with 8 mm diameter by 6 cm length and an 8 mm diameter by 4 cm length life star stent 5.Angioplasty of the left SFA and popliteal artery with 5 mm diameter angioplasty balloon 6.Stent placement x2 to the left SFA and popliteal artery with 6 mm diameter by 25 cm length Viabahn stent and a 7 mm diameter by 15 cm length Viabahn stent 7.Right lower extremity angiogram 8.Angioplasty of the right SFA and above-knee popliteal artery with 5 mm diameter angioplasty balloon 9.Stent placement x2 to the right SFA and popliteal artery with 6 mm diameter by 25 cm length Viabahn stent and 6 mm diameter by 10 cm length Viabahn stent In addition on 06/22/2020: RIGHT groin I & D with wound VAC placement  Since that time he has been doing well. The RIGHT groin has been progressing with the wound VAC. The LEFT groin was healing well without complaint until last night when he noted significant bleeding from the left groin at a small pinpoint opening at the proximal edge of the incision. Today his wife noted continued dark bleeding that saturated several bandages. In addition, the patient states he began feeling light headed and presented for evaluation. He denies groin or leg pain. Denies claudication or rest pain. Denies chest pain or shortness of breath. He has  been compliant with ASA/ Plavix.  No current facility-administered medications for this encounter.   Current Outpatient Medications  Medication Sig Dispense Refill  . acetaminophen (TYLENOL) 325 MG tablet Take 650 mg by mouth every 6 (six) hours as needed for mild pain or headache.     . albuterol (PROVENTIL) (2.5 MG/3ML) 0.083% nebulizer solution Take 3 mLs (2.5 mg total) by nebulization every 6 (six) hours as needed for wheezing or shortness of breath. 360 mL 0  . albuterol (VENTOLIN HFA) 108 (90 Base) MCG/ACT inhaler Inhale 1-2 puffs into the lungs every 6 (six) hours as needed for wheezing or shortness of breath. 8 g 0  . amoxicillin-clavulanate (AUGMENTIN) 875-125 MG tablet Take 1 tablet by mouth 2 (two) times daily. (Patient not taking: Reported on 06/20/2020) 14 tablet 0  . aspirin EC 81 MG tablet Take 1 tablet (81 mg total) by mouth daily. Swallow whole. (Patient not taking: Reported on 05/16/2020) 150 tablet 1  . atorvastatin (LIPITOR) 80 MG tablet Take 1 tablet (80 mg total) by mouth daily at 6 PM.    . clopidogrel (PLAVIX) 75 MG tablet Take 75 mg by mouth every evening.     . docusate sodium (COLACE) 100 MG capsule Take 1 capsule (100 mg total) by mouth 2 (two) times daily. 60 capsule 0  . Fluticasone-Umeclidin-Vilant (TRELEGY ELLIPTA) 100-62.5-25 MCG/INH AEPB Inhale 1 puff into the lungs every other day.     . furosemide (LASIX) 40 MG tablet Take 40 mg by mouth daily.     Marland Kitchen oxyCODONE-acetaminophen (PERCOCET/ROXICET) 5-325 MG tablet Take 1-2 tablets by mouth every 6 (six) hours as  needed for moderate pain or severe pain. 28 tablet 0  . senna-docusate (SENOKOT-S) 8.6-50 MG tablet Take 1 tablet by mouth at bedtime as needed for mild constipation. 30 tablet 0    Past Medical History:  Diagnosis Date  . Alcohol abuse   . Amaurosis fugax   . Atrial fibrillation (HCC)   . Carotid stenosis   . CHF (congestive heart failure) (HCC)   . Coagulopathy (HCC)   . COPD (chronic obstructive  pulmonary disease) (HCC)   . Coronary artery disease   . Depression   . Hyperlipemia   . Hypotension   . Leucocytosis   . Liver dysfunction   . Myocardial infarction (HCC)    non-STEMI. approx 2016  . Peripheral artery disease (HCC)    legs  . Tobacco abuse   . Wears dentures    full upper and lower    Past Surgical History:  Procedure Laterality Date  . APPLICATION OF WOUND VAC Right 06/22/2020   Procedure: APPLICATION OF WOUND VAC;  Surgeon: Annice Needy, MD;  Location: [facility] ORS;  Service: Vascular;  Laterality: Right;  . CARDIAC CATHETERIZATION N/A 07/10/2015   Procedure: Right/Left Heart Cath and Coronary Angiography;  Surgeon: Alwyn Pea, MD;  Location: [facility] INVASIVE CV LAB;  Service: Cardiovascular;  Laterality: N/A;  . CORONARY ARTERY BYPASS GRAFT  07/13/2015   [facility].  4 vessel  . ENDARTERECTOMY FEMORAL Bilateral 05/23/2020   Procedure: ENDARTERECTOMY FEMORAL BILATERAL SUPERFICIAL FEMORAL ARTERY STENTS ;  Surgeon: Renford Dills, MD;  Location: [facility] ORS;  Service: Vascular;  Laterality: Bilateral;  . INSERTION OF ILIAC STENT Left 05/23/2020   Procedure: INSERTION OF ILIAC STENT;  Surgeon: Renford Dills, MD;  Location: [facility] ORS;  Service: Vascular;  Laterality: Left;  . LEFT HEART CATH AND CORS/GRAFTS ANGIOGRAPHY N/A 01/23/2020   Procedure: LEFT HEART CATH AND CORS/GRAFTS ANGIOGRAPHY;  Surgeon: Alwyn Pea, MD;  Location: [facility] INVASIVE CV LAB;  Service: Cardiovascular;  Laterality: N/A;  . LOWER EXTREMITY ANGIOGRAPHY Right 04/03/2020   Procedure: LOWER EXTREMITY ANGIOGRAPHY;  Surgeon: Renford Dills, MD;  Location: [facility] INVASIVE CV LAB;  Service: Cardiovascular;  Laterality: Right;  . OLECRANON BURSECTOMY Left 05/06/2019   Procedure: OLECRANON BURSECTOMY AND DEBRIDEMENT;  Surgeon: Signa Kell, MD;  Location: [facility] ORS;  Service: Orthopedics;  Laterality: Left;  . WOUND DEBRIDEMENT Right 06/22/2020   Procedure: DEBRIDEMENT WOUND RIGHT GROIN;  Surgeon: Annice Needy, MD;  Location: [facility] ORS;  Service: Vascular;  Laterality: Right;    Social History Social History   Tobacco Use  . Smoking status: Former Smoker    Packs/day: 1.00    Years: 54.00    Pack years: 54.00    Types: Cigarettes    Quit date: 03/16/2020    Years since quitting: 0.3  . Smokeless tobacco: Never Used  . Tobacco comment: was 2.5 PPD for 50 yrs until 2016, quit a "few days ago"  Vaping Use  . Vaping Use: Never used  Substance Use Topics  . Alcohol use: Not Currently    Comment: quit over 15 yrs ago  . Drug use: Never    Family History Family History  Problem Relation Age of Onset  . Depression Sister     No Known Allergies   REVIEW OF SYSTEMS (Negative unless checked)  Constitutional: [] Weight loss  [] Fever  [] Chills Cardiac: [] Chest pain   [] Chest pressure   [] Palpitations   [] Shortness of breath when laying flat   [] Shortness of breath at rest   []   Shortness of breath with exertion. Vascular:  Pain in legs with walking   Pain in legs at rest   Pain in legs when laying flat   Claudication   Pain in feet when walking  Pain in feet at rest  Pain in feet when laying flat   History of DVT   Phlebitis   Swelling in legs   Varicose veins   Non-healing ulcers Pulmonary:   Uses home oxygen   Productive cough   Hemoptysis   Wheeze  COPD   Asthma Neurologic:  Dizziness  Blackouts   Seizures   History of stroke   History of TIA  Aphasia   Temporary blindness   Dysphagia   Weakness or numbness in arms   Weakness or numbness in legs Musculoskeletal:  Arthritis   Joint swelling   Joint pain   Low back pain Hematologic:  Easy bruising  Easy bleeding   Hypercoagulable state   Anemic  Hepatitis Gastrointestinal:  Blood in stool   Vomiting blood  Gastroesophageal reflux/heartburn   Difficulty swallowing. Genitourinary:  Chronic kidney disease   Difficult urination  Frequent urination   Burning with urination   Blood in urine Skin:  Rashes   Ulcers   Wounds Psychological:  History of anxiety    History of major depression.  Physical Examination  Vitals:   07/22/20 1223 07/22/20 1226  BP:  (!) 155/60  Pulse:  79  Resp:  16  Temp:  98.1 F (36.7 C)  TempSrc:  Oral  SpO2:  94%  Weight: 72.6 kg   Height:  (1.702 m)    Body mass index is 25.06 kg/m. Gen:  WD/WN, NAD Head: [place]/AT, No temporalis wasting. Prominent temp pulse not noted. Pulmonary:  Good air movement, respirations not labored, equal bilaterally.  Cardiac: RRR, normal S1, S2. Vascular: feet warm bilaterally Vessel Right Left  Radial Palpable Palpable  Ulnar Palpable Palpable  Brachial Palpable Palpable  Carotid Palpable, without bruit Palpable, without bruit  Aorta Not palpable N/A  Femoral Palpable Palpable  Popliteal Palpable Palpable  PT Palpable Palpable  DP Palpable Palpable   Gastrointestinal: soft, non-tender/non-distended. No guarding/reflex.  Musculoskeletal: M/S 5/5 throughout.  Extremities without ischemic changes.  No deformity or atrophy. No edema. RIGHT groin- VAC in place with good seal/suction, no drainage in canister. LEFT groin- no erythema, nontender, scar tissue beneath incision, well healed except 2 pinpoint openings proximal incision with mild-moderate dark serous/venous drainage. Neurologic: Sensation grossly intact in extremities.  Symmetrical.  Speech is fluent. Motor exam as listed above. Psychiatric: Judgment intact, Mood & affect appropriate for pt's clinical situation.        CBC Lab Results  Component Value Date   WBC 9.0 07/22/2020   HGB 14.4 07/22/2020   HCT 43.9 07/22/2020   MCV 88.9 07/22/2020   PLT 218 07/22/2020    BMET    Component Value Date/Time   NA 137 07/22/2020 1235   K 3.6 07/22/2020 1235   CL 99 07/22/2020 1235   CO2 28 07/22/2020 1235   GLUCOSE 161 (H) 07/22/2020 1235   BUN 11 07/22/2020 1235   CREATININE 1.02  07/22/2020 1235   CALCIUM 9.1 07/22/2020 1235   GFRNONAA >60 07/22/2020 1235   GFRAA >60 06/22/2020 1036   Estimated Creatinine Clearance: 64.8 mL/min (by C-G formula based on SCr of 1.02 mg/dL).  COAG Lab Results  Component Value Date   INR 1.1 06/22/2020   INR 1.0 05/16/2020    Radiology VAS [place] ABI WITH/WO  TBI  Result Date: 07/09/2020 LOWER EXTREMITY DOPPLER STUDY Indications: Peripheral artery disease.  Vascular Interventions: 04/03/20: Right profunda femoral artery PTA;                         05/23/20: Left EIA, bilateral SFA & bilateral popliteal                         artery stents with bilateral CFA/SFA/PFA                         endarterectomies;. Performing Technologist: Jamse Mead RT, RDMS, RVT  Examination Guidelines: A complete evaluation includes at minimum, Doppler waveform signals and systolic blood pressure reading at the level of bilateral brachial, anterior tibial, and posterior tibial arteries, when vessel segments are accessible. Bilateral testing is considered an integral part of a complete examination. Photoelectric Plethysmograph (PPG) waveforms and toe systolic pressure readings are included as required and additional duplex testing as needed. Limited examinations for reoccurring indications may be performed as noted.  ABI Findings: +---------+------------------+-----+--------+---------------------------+ Right    Rt Pressure (mmHg)IndexWaveformComment                     +---------+------------------+-----+--------+---------------------------+ Brachial 148                                                        +---------+------------------+-----+--------+---------------------------+ Popliteal                       biphasicpatent SFA/popliteal stents +---------+------------------+-----+--------+---------------------------+ ATA      157               1.06 biphasic                             +---------+------------------+-----+--------+---------------------------+ PTA      170               1.15 biphasic                            +---------+------------------+-----+--------+---------------------------+ Great Toe104               0.70 Normal                              +---------+------------------+-----+--------+---------------------------+ +---------+------------------+-----+--------+---------------------------+ Left     Lt Pressure (mmHg)IndexWaveformComment                     +---------+------------------+-----+--------+---------------------------+ Brachial 108                            >20mm/Mg less than right    +---------+------------------+-----+--------+---------------------------+ Popliteal                       biphasicpatent SFA/popliteal stents +---------+------------------+-----+--------+---------------------------+ ATA      153               1.03 biphasic                            +---------+------------------+-----+--------+---------------------------+  PTA      174               1.18 biphasic                            +---------+------------------+-----+--------+---------------------------+ Great Toe111               0.75 Normal                              +---------+------------------+-----+--------+---------------------------+ +-------+-----------+-----------+------------+------------+ ABI/TBIToday's ABIToday's TBIPrevious ABIPrevious TBI +-------+-----------+-----------+------------+------------+ Right  1.15       0.70       0.71        0.57         +-------+-----------+-----------+------------+------------+ Left   1.18       0.75       0.68        0.47         +-------+-----------+-----------+------------+------------+ Bilateral ABIs and TBIs appear increased compared to prior study on 04/27/20. No significant change in bilateral brachial pressures either.  Summary: Bilateral: Bilateral ankle-brachial indexes are  within normal range. No evidence of significant lower extremity arterial disease. 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  *See table(s) above for measurements and observations.  Electronically signed by Levora DredgeGregory Schnier MD on 07/09/2020 at 4:51:01 PM.   Final       Assessment/Plan S/p bilateral femoral endarterectomies, stenting, right wound debridement and wound VAC placement 1. Obtain LEFT femoral arterial duplex- evaluate for pseudoaneurysm/bleeding etiology 2. If positive will require admission/ Otherwise follow up in clinic tomorrow as scheduled    Bertram DenverEsco, Salle Brandle A, MD  07/22/2020 2:09 PM    This note was created with Dragon medical transcription system.  Any error is purely unintentional

## 2020-07-23 ENCOUNTER — Ambulatory Visit (INDEPENDENT_AMBULATORY_CARE_PROVIDER_SITE_OTHER): Payer: Medicare Other | Admitting: Vascular Surgery

## 2020-07-23 ENCOUNTER — Encounter: Payer: Self-pay | Admitting: Vascular Surgery

## 2020-07-23 DIAGNOSIS — I729 Aneurysm of unspecified site: Secondary | ICD-10-CM

## 2020-07-23 LAB — CBC WITH DIFFERENTIAL/PLATELET
Abs Immature Granulocytes: 0.01 10*3/uL (ref 0.00–0.07)
Basophils Absolute: 0 10*3/uL (ref 0.0–0.1)
Basophils Relative: 0 %
Eosinophils Absolute: 0 10*3/uL (ref 0.0–0.5)
Eosinophils Relative: 0 %
HCT: 31.9 % — ABNORMAL LOW (ref 39.0–52.0)
Hemoglobin: 10.3 g/dL — ABNORMAL LOW (ref 13.0–17.0)
Immature Granulocytes: 0 %
Lymphocytes Relative: 7 %
Lymphs Abs: 0.4 10*3/uL — ABNORMAL LOW (ref 0.7–4.0)
MCH: 29.1 pg (ref 26.0–34.0)
MCHC: 32.3 g/dL (ref 30.0–36.0)
MCV: 90.1 fL (ref 80.0–100.0)
Monocytes Absolute: 0.3 10*3/uL (ref 0.1–1.0)
Monocytes Relative: 5 %
Neutro Abs: 5.1 10*3/uL (ref 1.7–7.7)
Neutrophils Relative %: 88 %
Platelets: 150 10*3/uL (ref 150–400)
RBC: 3.54 MIL/uL — ABNORMAL LOW (ref 4.22–5.81)
RDW: 14.5 % (ref 11.5–15.5)
WBC: 5.8 10*3/uL (ref 4.0–10.5)
nRBC: 0 % (ref 0.0–0.2)

## 2020-07-23 LAB — BASIC METABOLIC PANEL
Anion gap: 4 — ABNORMAL LOW (ref 5–15)
BUN: 8 mg/dL (ref 8–23)
CO2: 27 mmol/L (ref 22–32)
Calcium: 8 mg/dL — ABNORMAL LOW (ref 8.9–10.3)
Chloride: 109 mmol/L (ref 98–111)
Creatinine, Ser: 0.63 mg/dL (ref 0.61–1.24)
GFR, Estimated: >60 mL/min (ref >60)
Glucose, Bld: 142 mg/dL — ABNORMAL HIGH (ref 70–99)
Potassium: 4.3 mmol/L (ref 3.5–5.1)
Sodium: 140 mmol/L (ref 135–145)

## 2020-07-23 LAB — PROTIME-INR
INR: 1.2 (ref 0.8–1.2)
Prothrombin Time: 14.9 seconds (ref 11.4–15.2)

## 2020-07-23 LAB — MRSA PCR SCREENING: MRSA by PCR: NEGATIVE

## 2020-07-23 MED ORDER — SODIUM CHLORIDE 0.9 % IV BOLUS
500.0000 mL | Freq: Once | INTRAVENOUS | Status: AC
  Administered 2020-07-23: 500 mL via INTRAVENOUS

## 2020-07-23 MED ORDER — LACTATED RINGERS IV SOLN: INTRAVENOUS | Status: DC

## 2020-07-23 MED ORDER — LACTATED RINGERS IV BOLUS
1000.0000 mL | Freq: Once | INTRAVENOUS | Status: AC
  Administered 2020-07-23: 1000 mL via INTRAVENOUS

## 2020-07-23 NOTE — Anesthesia Postprocedure Evaluation (Signed)
Anesthesia Post Note  Patient: Dennis Preston  Procedure(s) Performed: REPAIR LEFT FEMORAL PSEUDOANUERYSM (Left Groin)  Patient location during evaluation: ICU Anesthesia Type: General Level of consciousness: awake, awake and alert and oriented Pain management: pain level controlled Vital Signs Assessment: post-procedure vital signs reviewed and stable Respiratory status: spontaneous breathing, nonlabored ventilation and respiratory function stable Cardiovascular status: blood pressure returned to baseline and stable Postop Assessment: adequate PO intake and no apparent nausea or vomiting Anesthetic complications: no   No complications documented.   Last Vitals:  Vitals:   07/23/20 0339 07/23/20 0400  BP:  (!) 115/50  Pulse: 71 72  Resp: 17 18  Temp:    SpO2: 97% 98%    Last Pain:  Vitals:   07/23/20 0439  TempSrc:   PainSc: Asleep                 Ginger Carne

## 2020-07-23 NOTE — Progress Notes (Signed)
[facility] Daily Progress Note  Subjective: 07/22/20: Left common femoral ruptured pseudoaneurysm repair. Right wound VAC change.  Patient without complaint.  No issues overnight.  Objective: Vitals:   07/23/20 1200 07/23/20 1300 07/23/20 1330 07/23/20 1400  BP: (!) 115/50 132/67 115/64 98/80  Pulse: (!) 54 80 75 76  Resp: 13 19 (!) 22 (!) 21  Temp:      TempSrc:      SpO2: 98% 99% 97% 97%  Weight:      Height:        Intake/Output Summary (Last 24 hours) at 07/23/2020 1458 Last data filed at 07/23/2020 1408 Gross per 24 hour  Intake 5550.94 ml  Output 1730 ml  Net 3820.94 ml   Physical Exam: A&Ox3, NAD CV: RRR Pulmonary: CTA Bilaterally Abdomen: Soft, Nontender, Nondistended Right groin:  VAC intact.  To suction.  No leak. Vascular:  Left lower extremity: Thigh soft.  Calf soft patient is warm distally toes.  Good capillary refill.  Motor/sensory is intact.  There is no acute vascular compromise noted to the leg at this time.   Laboratory: CBC    Component Value Date/Time   WBC 5.8 07/23/2020 0348   HGB 10.3 (L) 07/23/2020 0348   HCT 31.9 (L) 07/23/2020 0348   PLT 150 07/23/2020 0348   BMET    Component Value Date/Time   NA 140 07/23/2020 0348   K 4.3 07/23/2020 0348   CL 109 07/23/2020 0348   CO2 27 07/23/2020 0348   GLUCOSE 142 (H) 07/23/2020 0348   BUN 8 07/23/2020 0348   CREATININE 0.63 07/23/2020 0348   CALCIUM 8.0 (L) 07/23/2020 0348   GFRNONAA >60 07/23/2020 0348   GFRAA >60 06/22/2020 1036   Assessment/Planning: The patient is a 69 year old male with multiple medical issues who presented with a ruptured left femoral artery pseudoaneurysm status post repair  1) CBC relatively stable.  AM CBC. 2) Making good urine.  Remove Foley. 3) off pressors 4) encouraged ambulation  Seen and examined with Dr. Wallis Mart [facility] PA-C 07/23/2020 2:58 PM

## 2020-07-23 NOTE — Progress Notes (Signed)
Pt A&OX4. 1 L LR bolus administered for hypotension. Tolerating heart healthy diet. L and R groin vascular sites to wound vacs and intact. Small quarter size hard area under L groin wound vac. MD notified. Will continue to monitor. Pedal pulses heard with doppler.

## 2020-07-24 LAB — BASIC METABOLIC PANEL
Anion gap: 6 (ref 5–15)
BUN: 12 mg/dL (ref 8–23)
CO2: 28 mmol/L (ref 22–32)
Calcium: 8.2 mg/dL — ABNORMAL LOW (ref 8.9–10.3)
Chloride: 105 mmol/L (ref 98–111)
Creatinine, Ser: 0.77 mg/dL (ref 0.61–1.24)
GFR, Estimated: >60 mL/min (ref >60)
Glucose, Bld: 90 mg/dL (ref 70–99)
Potassium: 4 mmol/L (ref 3.5–5.1)
Sodium: 139 mmol/L (ref 135–145)

## 2020-07-24 LAB — CBC WITH DIFFERENTIAL/PLATELET
Abs Immature Granulocytes: 0.02 10*3/uL (ref 0.00–0.07)
Basophils Absolute: 0 10*3/uL (ref 0.0–0.1)
Basophils Relative: 0 %
Eosinophils Absolute: 0.1 10*3/uL (ref 0.0–0.5)
Eosinophils Relative: 1 %
HCT: 31.5 % — ABNORMAL LOW (ref 39.0–52.0)
Hemoglobin: 10.1 g/dL — ABNORMAL LOW (ref 13.0–17.0)
Immature Granulocytes: 0 %
Lymphocytes Relative: 18 %
Lymphs Abs: 1.6 10*3/uL (ref 0.7–4.0)
MCH: 29.3 pg (ref 26.0–34.0)
MCHC: 32.1 g/dL (ref 30.0–36.0)
MCV: 91.3 fL (ref 80.0–100.0)
Monocytes Absolute: 1 10*3/uL (ref 0.1–1.0)
Monocytes Relative: 12 %
Neutro Abs: 6.1 10*3/uL (ref 1.7–7.7)
Neutrophils Relative %: 69 %
Platelets: 142 10*3/uL — ABNORMAL LOW (ref 150–400)
RBC: 3.45 MIL/uL — ABNORMAL LOW (ref 4.22–5.81)
RDW: 14.9 % (ref 11.5–15.5)
WBC: 8.8 10*3/uL (ref 4.0–10.5)
nRBC: 0 % (ref 0.0–0.2)

## 2020-07-24 LAB — SURGICAL PATHOLOGY

## 2020-07-24 LAB — PROTIME-INR
INR: 1.1 (ref 0.8–1.2)
Prothrombin Time: 13.8 seconds (ref 11.4–15.2)

## 2020-07-24 MED ORDER — CLOPIDOGREL BISULFATE 75 MG PO TABS
75.0000 mg | ORAL_TABLET | Freq: Every evening | ORAL | Status: DC
  Administered 2020-07-24 – 2020-07-25 (×2): 75 mg via ORAL
  Filled 2020-07-24 (×2): qty 1

## 2020-07-24 MED ORDER — FUROSEMIDE 40 MG PO TABS
40.0000 mg | ORAL_TABLET | Freq: Every day | ORAL | Status: DC
  Administered 2020-07-24 – 2020-07-26 (×3): 40 mg via ORAL
  Filled 2020-07-24: qty 1
  Filled 2020-07-24: qty 2
  Filled 2020-07-24: qty 1

## 2020-07-24 MED ORDER — ASPIRIN EC 81 MG PO TBEC
81.0000 mg | DELAYED_RELEASE_TABLET | Freq: Every day | ORAL | Status: DC
  Administered 2020-07-24 – 2020-07-26 (×3): 81 mg via ORAL
  Filled 2020-07-24 (×3): qty 1

## 2020-07-24 NOTE — Progress Notes (Signed)
[place] [facility] Daily Progress Note   Subjective: 07/22/20: Left common femoral ruptured pseudoaneurysm repair. Right wound VAC change.  Patient without complaint.  No issues overnight.  Objective: Vitals:   07/24/20 1100 07/24/20 1200 07/24/20 1300 07/24/20 1400  BP: (!) 67/35 (!) 62/53 (!) 80/40 (!) 89/45  Pulse: 64 64 (!) 56 (!) 52  Resp: (!) 26 (!) 26 (!) 21 (!) 21  Temp:      TempSrc:      SpO2: 95% 95% 97% 97%  Weight:      Height:        Intake/Output Summary (Last 24 hours) at 07/24/2020 1526 Last data filed at 07/24/2020 1443 Gross per 24 hour  Intake 2039.56 ml  Output 3200 ml  Net -1160.44 ml   Physical Exam: A&Ox3, NAD CV: RRR Pulmonary: CTA Bilaterally Abdomen: Soft, Nontender, Nondistended Right groin:             VAC intact.  To suction.  No leak. Vascular:             Left lower extremity: Thigh soft.  Calf soft patient is warm distally toes.  Good capillary refill.  Motor/sensory is intact.  There is no acute vascular compromise noted to the leg at this time.   Laboratory: CBC    Component Value Date/Time   WBC 8.8 07/24/2020 0438   HGB 10.1 (L) 07/24/2020 0438   HCT 31.5 (L) 07/24/2020 0438   PLT 142 (L) 07/24/2020 0438   BMET    Component Value Date/Time   NA 139 07/24/2020 0438   K 4.0 07/24/2020 0438   CL 105 07/24/2020 0438   CO2 28 07/24/2020 0438   GLUCOSE 90 07/24/2020 0438   BUN 12 07/24/2020 0438   CREATININE 0.77 07/24/2020 0438   CALCIUM 8.2 (L) 07/24/2020 0438   GFRNONAA >60 07/24/2020 0438   GFRAA >60 06/22/2020 1036   Assessment/Planning: The patient is a 69 year old male with multiple medical issues who presented with a ruptured left femoral artery pseudoaneurysm status post repair  1) CBC stable.  2) Making good urine.  3) off pressors 4) encouraged ambulation 5) patient with VAC placement to right groin which was initiated in an outside facility.  Changed in the operating room during recent  procedure however we will plan on removing tomorrow at the bedside we will consult wound nurse to be present.  We will plan on taking down Prevena VAC to left groin. 6) okay to transfer out of unit   Discussed with Dr. Wallis Mart [facility] PA-C 07/24/2020 3:26 PM

## 2020-07-24 NOTE — Plan of Care (Signed)
  Problem: Education: Goal: Required Educational Video(s) Outcome: Progressing   Problem: Clinical Measurements: Goal: Ability to maintain clinical measurements within normal limits will improve Outcome: Progressing Goal: Postoperative complications will be avoided or minimized Outcome: Progressing   

## 2020-07-24 NOTE — Progress Notes (Signed)
Received report from Tiffany, RN at this time.

## 2020-07-24 NOTE — Progress Notes (Signed)
A-line removed at this time per order.

## 2020-07-25 LAB — TYPE AND SCREEN
ABO/RH(D): A NEG
Antibody Screen: NEGATIVE
Unit division: 0
Unit division: 0
Unit division: 0
Unit division: 0
Unit division: 0
Unit division: 0
Unit division: 0
Unit division: 0

## 2020-07-25 LAB — CBC WITH DIFFERENTIAL/PLATELET
Abs Immature Granulocytes: 0.03 10*3/uL (ref 0.00–0.07)
Basophils Absolute: 0 10*3/uL (ref 0.0–0.1)
Basophils Relative: 0 %
Eosinophils Absolute: 0.3 10*3/uL (ref 0.0–0.5)
Eosinophils Relative: 3 %
HCT: 33.8 % — ABNORMAL LOW (ref 39.0–52.0)
Hemoglobin: 11.1 g/dL — ABNORMAL LOW (ref 13.0–17.0)
Immature Granulocytes: 0 %
Lymphocytes Relative: 20 %
Lymphs Abs: 1.6 10*3/uL (ref 0.7–4.0)
MCH: 29.5 pg (ref 26.0–34.0)
MCHC: 32.8 g/dL (ref 30.0–36.0)
MCV: 89.9 fL (ref 80.0–100.0)
Monocytes Absolute: 1.3 10*3/uL — ABNORMAL HIGH (ref 0.1–1.0)
Monocytes Relative: 16 %
Neutro Abs: 4.9 10*3/uL (ref 1.7–7.7)
Neutrophils Relative %: 61 %
Platelets: 165 10*3/uL (ref 150–400)
RBC: 3.76 MIL/uL — ABNORMAL LOW (ref 4.22–5.81)
RDW: 14.6 % (ref 11.5–15.5)
WBC: 8 10*3/uL (ref 4.0–10.5)
nRBC: 0 % (ref 0.0–0.2)

## 2020-07-25 LAB — BPAM RBC
Blood Product Expiration Date: 202111012359
Blood Product Expiration Date: 202111022359
Blood Product Expiration Date: 202111052359
Blood Product Expiration Date: 202111102359
Blood Product Expiration Date: 202111182359
Blood Product Expiration Date: 202111182359
Blood Product Expiration Date: 202111182359
Blood Product Expiration Date: 202111182359
ISSUE DATE / TIME: 202110250634
ISSUE DATE / TIME: 202110250634
Unit Type and Rh: 5100
Unit Type and Rh: 5100
Unit Type and Rh: 600
Unit Type and Rh: 600
Unit Type and Rh: 6200
Unit Type and Rh: 6200
Unit Type and Rh: 6200
Unit Type and Rh: 6200

## 2020-07-25 LAB — PREPARE RBC (CROSSMATCH)

## 2020-07-25 LAB — BASIC METABOLIC PANEL
Anion gap: 7 (ref 5–15)
BUN: 11 mg/dL (ref 8–23)
CO2: 33 mmol/L — ABNORMAL HIGH (ref 22–32)
Calcium: 8.8 mg/dL — ABNORMAL LOW (ref 8.9–10.3)
Chloride: 99 mmol/L (ref 98–111)
Creatinine, Ser: 0.77 mg/dL (ref 0.61–1.24)
GFR, Estimated: >60 mL/min (ref >60)
Glucose, Bld: 91 mg/dL (ref 70–99)
Potassium: 4.2 mmol/L (ref 3.5–5.1)
Sodium: 139 mmol/L (ref 135–145)

## 2020-07-25 MED ORDER — POLYETHYLENE GLYCOL 3350 17 G PO PACK
17.0000 g | PACK | Freq: Every evening | ORAL | Status: DC | PRN
  Administered 2020-07-25: 17 g via ORAL
  Filled 2020-07-25: qty 1

## 2020-07-25 MED ORDER — BISACODYL 10 MG RE SUPP: 10.0000 mg | Freq: Every day | RECTAL | Status: DC | PRN

## 2020-07-25 NOTE — TOC Initial Note (Signed)
Transition of Care [facility]) - Initial/Assessment Note    Patient Details  Name: Dennis Preston MRN: [id number] Date of Birth: 1951-08-01  Transition of Care [facility]) CM/SW Contact:    Liliana Cline, LCSW Phone Number: 07/25/2020, 2:33 PM  Clinical Narrative:                Spoke to patient about HHPT and HHOT recommendations. Patient says he is already active with [facility]. Confirmed with [facility] Rep Barbara Cower that he is active for RN and PT, informed Barbara Cower we will need to add OT services.   Patient reported he lives with his wife and drives himself to appointments. He has a cane. Pharmacy is [facility]. PCP is Dr. Marcello Fennel. Patient has home oxygen through [facility].    Expected Discharge Plan: Home w Home Health Services Barriers to Discharge: Continued Medical Work up   Patient Goals and CMS Choice Patient states their goals for this hospitalization and ongoing recovery are:: home with home health CMS Medicare.gov Compare Post Acute Care list provided to:: Patient Choice offered to / list presented to : Patient  Expected Discharge Plan and Services Expected Discharge Plan: Home w Home Health Services       Living arrangements for the past 2 months: Single Family Home                           HH Arranged: RN, PT, OT [facility] Agency: [facility] Date HH Agency Contacted: 07/25/20   Representative spoke with at [facility] Agency: Barbara Cower  Prior Living Arrangements/Services Living arrangements for the past 2 months: Single Family Home Lives with:: Spouse Patient language and need for interpreter reviewed:: Yes Do you feel safe going back to the place where you live?: Yes      Need for Family Participation in Patient Care: Yes (Comment) Care giver support system in place?: Yes (comment) Current home services: DME Criminal Activity/Legal Involvement Pertinent to Current Situation/Hospitalization: No - Comment as needed  Activities of Daily  Living Home Assistive Devices/Equipment: Wound Vac ADL Screening (condition at time of admission) Patient's cognitive ability adequate to safely complete daily activities?: Yes Is the patient deaf or have difficulty hearing?: No Does the patient have difficulty seeing, even when wearing glasses/contacts?: No Does the patient have difficulty concentrating, remembering, or making decisions?: No Patient able to express need for assistance with ADLs?: Yes Does the patient have difficulty dressing or bathing?: No Independently performs ADLs?: Yes (appropriate for developmental age) Does the patient have difficulty walking or climbing stairs?: Yes Weakness of Legs: Both Weakness of Arms/Hands: None  Permission Sought/Granted Permission sought to share information with : Oceanographer granted to share information with : Yes, Verbal Permission Granted     Permission granted to share info w AGENCY: [facility], [facility]        Emotional Assessment       Orientation: : Oriented to Self, Oriented to Place, Oriented to  Time, Oriented to Situation Alcohol / Substance Use: Not Applicable Psych Involvement: No (comment)  Admission diagnosis:  Pseudoaneurysm (HCC) [I72.9] Pseudoaneurysm of femoral artery (HCC) [I72.4] Patient Active Problem List   Diagnosis Date Noted  . 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 Atherosclerosis of artery of extremity with rest pain (HCC) 05/23/2020  . Chronic venous insufficiency 03/01/2020  . COPD exacerbation (HCC) 02/13/2020  . Aortic atherosclerosis (HCC) 08/08/2019  . COPD (chronic obstructive pulmonary disease) with chronic  bronchitis (HCC) 08/08/2019  . COPD with acute exacerbation (HCC) 06/13/2018  . Tobacco use 01/14/2017  . A-fib (HCC) 07/18/2015  . Coagulopathy (HCC) 07/17/2015  . Hypotension 07/14/2015  . Leukocytosis 07/14/2015  . PAD (peripheral artery disease) (HCC) 07/14/2015  . S/P coronary  artery bypass graft x 4 07/14/2015  . Carotid atherosclerosis, bilateral 07/12/2015  . Transient visual loss of right eye 07/11/2015  . NSTEMI (non-ST elevated myocardial infarction) (HCC) 07/10/2015  . Amaurosis fugax 07/10/2015  . Coronary artery disease 07/10/2015  . Hypoxia 07/09/2015  . ANXIETY 02/24/2007  . ERECTILE DYSFUNCTION 02/24/2007  . LIVER FUNCTION TESTS, ABNORMAL 02/24/2007   PCP:  Barbette Reichmann, MD Pharmacy:   [facility] - Adline Peals, [place] - [address] [place] [address] Phone: [phone] Fax: [phone]     Social Determinants of Health (SDOH) Interventions    Readmission Risk Interventions No flowsheet data found.

## 2020-07-25 NOTE — Consult Note (Signed)
WOC Nurse Consult Note: Reason for Consult: NPWT to the right groin PA Kim S. At the bedside for wound assessment. She has taken Prevena off the left groin and place OPsite visible  Wound type: surgical wound; from outside facility Pressure Injury POA: NA Measurement: 1.5cm 1.0cm x 0.5cm  Wound bed:100% clean, granulation Drainage (amount, consistency, odor) none, some odor but related to sponge in place Periwound: intact  Dressing procedure/placement/frequency: DC NPWT; begin amorphous saline gel daily, top with dry dressing. Change daily. Requested Licensed conveyancer to order for nurse use.  Nurse aware. Discussed rationale with patient and his wife.  Teach wife, most likely that [facility] will not continue to follow based on wound size and care needed.   Discussed POC with patient and bedside nurse.  Re consult if needed, will not follow at this time. Thanks  Darthula Desa [employer], RN,CWOCN, CNS, CWON-AP [phone])

## 2020-07-25 NOTE — Evaluation (Signed)
Occupational Therapy Evaluation Patient Details Name: Dennis Preston MRN: [id number] DOB: December 27, 1950 Today's Date: 07/25/2020    History of Present Illness Dennis Preston is a 69 yo male that presented with ruptured L femoral artery pseudoaneurysm s/p repair.  PMH of bilateral femoral endarterectomy and stenting, COPD, CAD, past MI, PVD, CHF, anxiety, depression.   Clinical Impression   Dennis Preston was seen for OT evaluation this date. Prior to hospital admission, Dennis Preston was generally independent in all ADL management. He endorses walking w/o an assistive device and denies falls history in past 6 months. Dennis Preston lives with his spouse who is available 24/7 to provide assist. Currently Dennis Preston demonstrates impairments as described below (See OT problem list) which functionally limit his ability to perform ADL/self-care tasks. Dennis Preston currently requires MAX A to don bilat hospital socks, he is able to perform functional mobility given supervision +2 for chair follow for safety and mgt of O2 tank. Dennis Preston would benefit from skilled OT services to address noted impairments and functional limitations (see below for any additional details) in order to maximize safety and independence while minimizing falls risk and caregiver burden. Upon hospital discharge, recommend HHOT to maximize Dennis Preston safety and return to functional independence during meaningful occupations of daily life.      Follow Up Recommendations  Home health OT;Supervision - Intermittent    Equipment Recommendations  None recommended by OT (Dennis Preston does not want BSC.)    Recommendations for Other Services       Precautions / Restrictions Precautions Precautions: Fall Restrictions Weight Bearing Restrictions: No      Mobility Bed Mobility Overal bed mobility: Needs Assistance Bed Mobility: Supine to Sit     Supine to sit: Min assist     General bed mobility comments: MIN A for trunk elevation 2/2 L flank pain during sup>sit.    Transfers Overall transfer level:  Needs assistance Equipment used: Rolling walker (2 wheeled) Transfers: Sit to/from [employer] Sit to Stand: +2 safety/equipment;Supervision;Min assist;Min guard Stand pivot transfers: Supervision;Min guard;+2 safety/equipment       General transfer comment: Min A for initial STS, progresses to supervision/min guard as mobility progresses.    Balance Overall balance assessment: Needs assistance Sitting-balance support: Feet supported;No upper extremity supported Sitting balance-Leahy Scale: Good Sitting balance - Comments: Steady static/dynamic sitting, reaching within BOS.   Standing balance support: During functional activity;Bilateral upper extremity supported Standing balance-Leahy Scale: Fair Standing balance comment: Requires BUE support to maintain standing balance.                           ADL either performed or assessed with clinical judgement   ADL Overall ADL's : Needs assistance/impaired                                       General ADL Comments: Dennis Preston functionally limited by decreased activity tolerance, LLE weakness, and decreased LB access. He requires MAX A to don bilat hospital socks. Anticipate MIN-MOD A for additional LB bathing/dressing tasks. Dennis Preston req. MIN A for initial STS from EOB but progresses to SBA/supervision for mgt of O2 tank and safety with functional mobility during session.     Vision Baseline Vision/History: No visual deficits Patient Visual Report: No change from baseline       Perception     Praxis      Pertinent Vitals/Pain Pain Assessment: No/denies  pain     Hand Dominance Right (Dennis Preston states he "throws with my left hand write and eat with my right hand.")   Extremity/Trunk Assessment Upper Extremity Assessment Upper Extremity Assessment: Overall WFL for tasks assessed   Lower Extremity Assessment Lower Extremity Assessment: Generalized weakness;LLE deficits/detail;Defer to Dennis Preston evaluation LLE  Deficits / Details: generally weak, Dennis Preston endorses LLE burning at start of session but this improved with mobility. LLE Coordination: decreased gross motor   Cervical / Trunk Assessment Cervical / Trunk Assessment: Normal   Communication Communication Communication: No difficulties   Cognition Arousal/Alertness: Awake/alert Behavior During Therapy: WFL for tasks assessed/performed Overall Cognitive Status: Within Functional Limits for tasks assessed                                 General Comments: Dennis Preston presents generally A&O, requires some cueing to don oxygen and re-direction to task, but overall WFL.   General Comments  BLE wound dressings intact at start/end of session. Dennis Preston recieved with nasal cannula removed SpO2 noted to be 85%, Dennis Preston educated on importance of maintaining O2 sats WFL and [place] replaced. O2 sats rebound to >/=92% t/o session c Dennis Preston on 2L O2. RN notified.    Exercises Other Exercises Other Exercises: Dennis Preston educated on role of OT in acute setting, falls prevention strategies with emphasis on safe footwear, safe use of AE/DME for ADL management, Energy conservation strategies including pursed lip breathing and activity pacing, and routines modifications to support safety and functional indep upon hospital DC. Other Exercises: OT facilitates LB dressing, and supports functional mobility with Dennis Preston this date. See ADL section for additional detail regarding occupational performance.   Shoulder Instructions      Home Living Family/patient expects to be discharged to:: Private residence Living Arrangements: Spouse/significant other Available Help at Discharge: Family;Available 24 hours/day (Dennis Preston reports he has had HH nsg and therapy since recent SNF stay as well.) Type of Home: House Home Access: Stairs to enter [employer] of Steps: 6 Entrance Stairs-Rails: Right;Left;Can reach both Home Layout: One level         Firefighter: Standard Bathroom  Accessibility: Yes How Accessible: Accessible via walker Home Equipment: Cane - single point;Walker - 2 wheels          Prior Functioning/Environment Level of Independence: Independent        Comments: Dennis Preston states he has gotten back to walking independently, managing all ADLs w/o assist, and participates in his [facility] therapy program frequently since past admission.        OT Problem List: Decreased strength;Pain;Cardiopulmonary status limiting activity;Decreased activity tolerance;Decreased safety awareness;Impaired balance (sitting and/or standing);Decreased knowledge of use of DME or AE      OT Treatment/Interventions: Self-care/ADL training;Therapeutic exercise;Therapeutic activities;DME and/or AE instruction;Patient/family education;Balance training;Energy conservation    OT Goals(Current goals can be found in the care plan section) Acute Rehab OT Goals Patient Stated Goal: To go home and be able to take a shower OT Goal Formulation: With patient Time For Goal Achievement: 08/08/20 Potential to Achieve Goals: Good ADL Goals Dennis Preston Will Perform Grooming: with modified independence;standing (c LRAD PRN for improved safety/functional indep.) Dennis Preston Will Perform Upper Body Dressing: sitting;with modified independence Dennis Preston Will Perform Lower Body Dressing: sit to/from stand;with modified independence;with adaptive equipment (c LRAD PRN for improved safety/functional indep.) Dennis Preston Will Transfer to Toilet: regular height toilet;ambulating;with modified independence (c LRAD PRN for improved safety/functional indep.)  OT Frequency: Min 1X/week  Barriers to D/C:            Co-evaluation Dennis Preston/OT/SLP Co-Evaluation/Treatment: Yes Reason for Co-Treatment: Complexity of the patient's impairments (multi-system involvement);For patient/therapist safety;To address functional/ADL transfers Dennis Preston goals addressed during session: Mobility/safety with mobility;Balance;Proper use of DME;Strengthening/ROM OT goals  addressed during session: ADL's and self-care;Proper use of Adaptive equipment and DME      AM-PAC OT "6 Clicks" Daily Activity     Outcome Measure Help from another person eating meals?: None Help from another person taking care of personal grooming?: A Little Help from another person toileting, which includes using toliet, bedpan, or urinal?: A Little Help from another person bathing (including washing, rinsing, drying)?: A Lot Help from another person to put on and taking off regular upper body clothing?: A Lot Help from another person to put on and taking off regular lower body clothing?: A Little 6 Click Score: 17   End of Session Equipment Utilized During Treatment: Gait belt;Rolling walker;Oxygen Nurse Communication: Mobility status;Other (comment) (vitals during session.)  Activity Tolerance: Patient tolerated treatment well;No increased pain Patient left: in chair;with call bell/phone within reach;with chair alarm set;Other (comment) (BLE wound dressings intact, [place] in place on 2L)  OT Visit Diagnosis: Other abnormalities of gait and mobility (R26.89);Pain;Muscle weakness (generalized) (M62.81) Pain - Right/Left: Left Pain - part of body: Hip                Time: 4854-6270 OT Time Calculation (min): 31 min Charges:  OT General Charges $OT Visit: 1 Visit OT Evaluation $OT Eval Moderate Complexity: 1 Mod OT Treatments $Self Care/Home Management : 8-22 mins  Rockney Ghee, M.S., OTR/L Ascom: [phone] 07/25/20, 11:50 AM

## 2020-07-25 NOTE — Evaluation (Signed)
Physical Therapy Evaluation Patient Details Name: Dennis Preston MRN: [id number] DOB: [birth year] Today's Date: 07/25/2020   History of Present Illness  Pt is a 69 yo male that presented with ruptured L femoral artery pseudoaneurysm s/p repair.  PMH of bilateral femoral endarterectomy and stenting, COPD, CAD, past MI, PVD, CHF, anxiety, depression.    Clinical Impression  Pt alert, agreeable to PT, reported L leg/groin pain as 6/10 pain that he stated improved with mobility. Prior to admission pt stated he was independent for mobility, lives with his wife no falls since returning home from rehab stay.  The patient needed minA to sit up to EOB due to L flank pain. Sit <> Stand with RW and initially minA, progressed to sit <> stand with supervision. He was able to ambulate ~136ft with CGA. Several PT guided standing rest breaks due to pt reported light headedness but able to rest in standing and continue with ambulation. On 2L via [place] throughout mobility with spO2 >90% and RN notified of pt status at end of session seated in chair.  Overall the patient demonstrated deficits (see "PT Problem List") that impede the patient's functional abilities, safety, and mobility and would benefit from skilled PT intervention. Recommendation is HHPT with supervision for mobility/OOB.     Follow Up Recommendations Home health PT;Supervision for mobility/OOB    Equipment Recommendations  None recommended by PT    Recommendations for Other Services       Precautions / Restrictions Precautions Precautions: Fall Restrictions Weight Bearing Restrictions: No      Mobility  Bed Mobility Overal bed mobility: Needs Assistance Bed Mobility: Supine to Sit     Supine to sit: Min assist     General bed mobility comments: MIN A for trunk elevation 2/2 L flank pain during sup>sit.    Transfers Overall transfer level: Needs assistance Equipment used: Rolling walker (2 wheeled) Transfers: Sit to/from  [employer] Sit to Stand: +2 safety/equipment;Supervision;Min assist;Min guard Stand pivot transfers: Supervision;Min guard;+2 safety/equipment       General transfer comment: Min A for initial STS, progresses to supervision/min guard as mobility progresses.  Ambulation/Gait Ambulation/Gait assistance: Min guard Gait Distance (Feet): 100 Feet Assistive device: Rolling walker (2 wheeled)   Gait velocity: decreased   General Gait Details: occasional standing breaks directed by therapist due to pt complaints of lightheadedness. Able to stand for said breaks.  Stairs            Wheelchair Mobility    Modified Rankin (Stroke Patients Only)       Balance Overall balance assessment: Needs assistance Sitting-balance support: Feet supported;No upper extremity supported Sitting balance-Leahy Scale: Good Sitting balance - Comments: Steady static/dynamic sitting, reaching within BOS.   Standing balance support: During functional activity;Bilateral upper extremity supported Standing balance-Leahy Scale: Fair Standing balance comment: Requires BUE support to maintain standing balance.                             Pertinent Vitals/Pain Pain Assessment: No/denies pain    Home Living Family/patient expects to be discharged to:: Private residence Living Arrangements: Spouse/significant other Available Help at Discharge: Family;Available 24 hours/day (Pt reports he has had HH nsg and therapy since recent SNF stay as well.) Type of Home: House Home Access: Stairs to enter Entrance Stairs-Rails: Right;Left;Can reach both Entrance Stairs-Number of Steps: 6 Home Layout: One level Home Equipment: Cane - single point;Walker - 2 wheels  Prior Function Level of Independence: Independent         Comments: Pt states he has gotten back to walking independently, managing all ADLs w/o assist, and participates in his [facility] therapy program frequently since  past admission.     Hand Dominance   Dominant Hand: Right (Pt states he "throws with my left hand write and eat with my right hand.")    Extremity/Trunk Assessment   Upper Extremity Assessment Upper Extremity Assessment: Defer to OT evaluation;Overall [facility] for tasks assessed    Lower Extremity Assessment Lower Extremity Assessment: Generalized weakness;LLE deficits/detail;Defer to PT evaluation LLE Deficits / Details: generally weak, pt endorses LLE burning at start of session but this improved with mobility. LLE Coordination: decreased gross motor    Cervical / Trunk Assessment Cervical / Trunk Assessment: Normal  Communication   Communication: No difficulties  Cognition Arousal/Alertness: Awake/alert Behavior During Therapy: WFL for tasks assessed/performed Overall Cognitive Status: Within Functional Limits for tasks assessed                                 General Comments: Pt presents generally A&O, requires some cueing to don oxygen and re-direction to task, but overall WFL.      General Comments General comments (skin integrity, edema, etc.): BLE wound dressing intact at start/end of session. [place] in place for ambulation spO2 >91% throughout    Exercises Other Exercises Other Exercises: Pt educated on importance of continued mobility during hospital stay, pt verbalized understanding Other Exercises: Pt on 2L via [place] at end of session, RN notified   Assessment/Plan    PT Assessment Patient needs continued PT services  PT Problem List Decreased strength;Decreased safety awareness;Decreased activity tolerance;Decreased balance;Decreased knowledge of use of DME;Decreased mobility;Pain       PT Treatment Interventions DME instruction;Therapeutic exercise;Gait training;Balance training;Stair training;Neuromuscular re-education;Functional mobility training;Therapeutic activities;Patient/family education    PT Goals (Current goals can be found in the Care Plan  section)  Acute Rehab PT Goals Patient Stated Goal: to go home PT Goal Formulation: With patient Time For Goal Achievement: 08/08/20 Potential to Achieve Goals: Good    Frequency Min 2X/week   Barriers to discharge        Co-evaluation PT/OT/SLP Co-Evaluation/Treatment: Yes Reason for Co-Treatment: Complexity of the patient's impairments (multi-system involvement);For patient/therapist safety PT goals addressed during session: Mobility/safety with mobility;Balance;Proper use of DME;Strengthening/ROM OT goals addressed during session: ADL's and self-care;Proper use of Adaptive equipment and DME       AM-PAC PT "6 Clicks" Mobility  Outcome Measure Help needed turning from your back to your side while in a flat bed without using bedrails?: A Little Help needed moving from lying on your back to sitting on the side of a flat bed without using bedrails?: A Little Help needed moving to and from a bed to a chair (including a wheelchair)?: A Little Help needed standing up from a chair using your arms (e.g., wheelchair or bedside chair)?: A Little Help needed to walk in hospital room?: A Little Help needed climbing 3-5 steps with a railing? : A Little 6 Click Score: 18    End of Session Equipment Utilized During Treatment: Gait belt;Oxygen (2L) Activity Tolerance: Patient tolerated treatment well Patient left: in chair;with chair alarm set;with call bell/phone within reach Nurse Communication: Mobility status PT Visit Diagnosis: Other abnormalities of gait and mobility (R26.89);Muscle weakness (generalized) (M62.81);Pain Pain - Right/Left: Left Pain - part of body: Leg  Time: 5797-2820 PT Time Calculation (min) (ACUTE ONLY): 30 min   Charges:   PT Evaluation $PT Eval Low Complexity: 1 Low PT Treatments $Therapeutic Exercise: 8-22 mins       Olga Coaster PT, DPT 1:45 PM,07/25/20

## 2020-07-25 NOTE — Progress Notes (Signed)
[facility] Daily Progress Note  Subjective: Patient without complaint.  No issues overnight.  Happy to be out of the ICU.  Objective: Vitals:   07/24/20 2009 07/25/20 0022 07/25/20 0440 07/25/20 0714  BP: (!) 144/60 (!) 131/43 (!) 118/52 (!) 132/55  Pulse: 64 (!) 59 (!) 54 (!) 58  Resp: 18 19 16 16   Temp: 98 F (36.7 C) 97.8 F (36.6 C) (!) 97.5 F (36.4 C) 97.9 F (36.6 C)  TempSrc: Oral Oral Oral Oral  SpO2: 93% 92% 96% 96%  Weight:      Height:        Intake/Output Summary (Last 24 hours) at 07/25/2020 1130 Last data filed at 07/25/2020 0900 Gross per 24 hour  Intake 50 ml  Output 1825 ml  Net -1775 ml   Physical Exam: A&Ox3, NAD CV: RRR Pulmonary: CTA Bilaterally Abdomen: Soft, Nontender, Nondistended Right groin: VAC removed. Wound bed:100% clean, granulation (1.5cm 1.0cm x 0.5cm) Left groin: Prevena VAC removed.  Incision clean dry intact.  Staples/sutures intact.  Redressed with honeycomb.  Vascular: Left lower extremity:Thigh soft. Calf soft patient is warm distally toes. Good capillary refill. Motor/sensory is intact. There is no acute vascular compromise noted to the leg at this time.   Laboratory: CBC    Component Value Date/Time   WBC 8.0 07/25/2020 0411   HGB 11.1 (L) 07/25/2020 0411   HCT 33.8 (L) 07/25/2020 0411   PLT 165 07/25/2020 0411   BMET    Component Value Date/Time   NA 139 07/25/2020 0411   K 4.2 07/25/2020 0411   CL 99 07/25/2020 0411   CO2 33 (H) 07/25/2020 0411   GLUCOSE 91 07/25/2020 0411   BUN 11 07/25/2020 0411   CREATININE 0.77 07/25/2020 0411   CALCIUM 8.8 (L) 07/25/2020 0411   GFRNONAA >60 07/25/2020 0411   GFRAA >60 06/22/2020 1036   Assessment/Planning: The patient is a 69 year old male with multiple medical issues who presented with a ruptured left femoral artery pseudoaneurysm status post repair  1)CBC stable / making good urine. 2) Right groin VAC removed.   Very small shallow wound with 100% granulation noted.  We will stop VAC therapy at this time. Begin amorphous saline gel daily, top with dry dressing. Change daily. 4)Prevena VAC removed.  Incision is clean dry and intact.  Honeycomb reapplied. 5) Encourage out of bed and ambulation.  PT/OT  Discussed with Dr. 73 Jeanpaul Biehl PA-C 07/25/2020 11:30 AM

## 2020-07-26 MED ORDER — OXYCODONE-ACETAMINOPHEN 5-325 MG PO TABS: 1.0000 | ORAL_TABLET | Freq: Four times a day (QID) | ORAL | 0 refills | Status: DC | PRN

## 2020-07-26 NOTE — Progress Notes (Signed)
Discharge education reviewed with pt and wife. Verbalized understanding.  Pt transported home with wife via private vehicle.  Wound care to Rt groin taught to wife before discharge.  No s/s of distress, VSS.

## 2020-07-26 NOTE — Discharge Summary (Signed)
[facility]    Discharge Summary  Patient ID:  Dennis Preston MRN: [id number] DOB/AGE: 1951/06/17 69 y.o.  Admit date: 07/22/2020 Discharge date: 07/26/2020 Date of Surgery: 07/22/2020 Surgeon: Surgeon(s): Esco, Morton Peters, MD  Admission Diagnosis: Pseudoaneurysm (HCC) [I72.9] Pseudoaneurysm of femoral artery (HCC) [I72.4]  Discharge Diagnoses:  Pseudoaneurysm (HCC) [I72.9] Pseudoaneurysm of femoral artery (HCC) [I72.4]  Secondary Diagnoses: Past Medical History:  Diagnosis Date  . Alcohol abuse   . Amaurosis fugax   . Atrial fibrillation (HCC)   . Carotid stenosis   . CHF (congestive heart failure) (HCC)   . Coagulopathy (HCC)   . COPD (chronic obstructive pulmonary disease) (HCC)   . Coronary artery disease   . Depression   . Hyperlipemia   . Hypotension   . Leucocytosis   . Liver dysfunction   . Myocardial infarction (HCC)    non-STEMI. approx 2016  . Peripheral artery disease (HCC)    legs  . Tobacco abuse   . Wears dentures    full upper and lower   Procedure(s): 07/22/20: 1. Left common femoral ruptured pseudoaneurysm repair. Right wound VAC change  Discharged Condition: Good  HPI / Hospital Course:  Patient presents with ruptured  Left femoral artery pseudoaneurysm. Repair is planned to prevent hemorrhage and limb loss The risks and benefits as well as alternative therapies including intervention were reviewed in detail all questions were answered the patient agrees to proceed with surgery. On 07/22/20. The patient underwent:  Left common femoral ruptured pseudoaneurysm repair. Right wound VAC change  The patient tolerated the procedure well and was transferred to the recovery room to the ICU without issue. Patient's night of surgery was unremarkable. During the patient's inpatient stay, his diet was advanced, his Foley was removed, his pain was controlled with use of p.o. pain medication and he was ambulating at baseline.  Patient was seen by occupational and physical therapy and deemed appropriate to be discharged home. Patient was also seen by our wound care nurses and his right wound was healed enough to remove VAC therapy dressings. Day of discharge, the patient was afebrile with stable vital signs, and improved physical exam.  Extubated: POD # 0  Physical exam:  A&Ox3, NAD CV: RRR Pulmonary: CTA Bilaterally Abdomen: Soft, Nontender, Nondistended Right groin: Wound bed:100% clean, granulation (1.5cm 1.0cm x 0.5cm) Left groin: Incision clean dry intact.  Staples/sutures intact. Redressed with honeycomb. Vascular: Left lower extremity:Thigh soft. Calf soft patient is warm distally toes. Good capillary refill. Motor/sensory is intact. There is no acute vascular compromise noted to the leg at this time.  Labs: As below  Complications: None  Consults: None  Significant Diagnostic Studies: CBC Lab Results  Component Value Date   WBC 8.0 07/25/2020   HGB 11.1 (L) 07/25/2020   HCT 33.8 (L) 07/25/2020   MCV 89.9 07/25/2020   PLT 165 07/25/2020   BMET    Component Value Date/Time   NA 139 07/25/2020 0411   K 4.2 07/25/2020 0411   CL 99 07/25/2020 0411   CO2 33 (H) 07/25/2020 0411   GLUCOSE 91 07/25/2020 0411   BUN 11 07/25/2020 0411   CREATININE 0.77 07/25/2020 0411   CALCIUM 8.8 (L) 07/25/2020 0411   GFRNONAA >60 07/25/2020 0411   GFRAA >60 06/22/2020 1036   COAG Lab Results  Component Value Date   INR 1.1 07/24/2020   INR 1.2 07/23/2020   INR 1.2 07/22/2020   Disposition:  Discharge to :Home  Allergies as of 07/26/2020  Reactions   Chlorhexidine       Medication List    TAKE these medications   acetaminophen 325 MG tablet Commonly known as: TYLENOL Take 650 mg by mouth every 6 (six) hours as needed for mild pain or headache.   albuterol 108 (90 Base) MCG/ACT inhaler Commonly known as: VENTOLIN HFA Inhale 1-2 puffs into the lungs every 6 (six) hours  as needed for wheezing or shortness of breath.   albuterol (2.5 MG/3ML) 0.083% nebulizer solution Commonly known as: PROVENTIL Take 3 mLs (2.5 mg total) by nebulization every 6 (six) hours as needed for wheezing or shortness of breath.   aspirin EC 81 MG tablet Take 1 tablet (81 mg total) by mouth daily. Swallow whole.   atorvastatin 80 MG tablet Commonly known as: LIPITOR Take 1 tablet (80 mg total) by mouth daily at 6 PM.   clopidogrel 75 MG tablet Commonly known as: PLAVIX Take 75 mg by mouth every evening.   docusate sodium 100 MG capsule Commonly known as: COLACE Take 1 capsule (100 mg total) by mouth 2 (two) times daily.   furosemide 40 MG tablet Commonly known as: LASIX Take 40 mg by mouth daily.   oxyCODONE-acetaminophen 5-325 MG tablet Commonly known as: PERCOCET/ROXICET Take 1 tablet by mouth every 6 (six) hours as needed for moderate pain or severe pain. What changed: how much to take   senna-docusate 8.6-50 MG tablet Commonly known as: Senokot-S Take 1 tablet by mouth at bedtime as needed for mild constipation.   Trelegy Ellipta 100-62.5-25 MCG/INH Aepb Generic drug: Fluticasone-Umeclidin-Vilant Inhale 1 puff into the lungs every other day.      Verbal and written Discharge instructions given to the patient. Wound care per Discharge AVS   Follow-up Information    Dew, Marlow Baars, MD Follow up in 1 week(s).   Specialties: Vascular Surgery, Radiology, Interventional Cardiology Why: Can see Dew or Vivia Birmingham.  First postoperative wound check.  No studies needed. Contact information: [address] [place] [address] [phone]              Signed: Tonette Lederer, PA-C  07/26/2020, 3:32 PM

## 2020-07-26 NOTE — Discharge Instructions (Signed)
Vascular Surgery Discharge Instructions:   1) You may shower. Please keep both of your groins clean and dry. Gently clean with soap and water. Gently pat dry. 2) No driving or drinking alcohol while taking pain medication.  3) Do not engage in any strenuous activity. Do not lift greater than 10 pounds for at least 3 weeks.

## 2020-07-26 NOTE — TOC Transition Note (Signed)
Transition of Care [facility]) - CM/SW Discharge Note   Patient Details  Name: Dennis Preston MRN: [id number] Date of Birth: [birth year]  [facility] CM/SW Contact:  Liliana Cline, LCSW Phone Number: 07/26/2020, 3:06 PM   Clinical Narrative:   Patient has orders to discharge home today. Home Health already in place through [facility]- PT, OT, RN. Notified [facility] Representative Barbara Cower of discharge orders in.    Final next level of care: Home w Home Health Services Barriers to Discharge: Barriers Resolved   Patient Goals and CMS Choice Patient states their goals for this hospitalization and ongoing recovery are:: home with home health CMS Medicare.gov Compare Post Acute Care list provided to:: Patient Choice offered to / list presented to : Patient  Discharge Placement                Patient to be transferred to facility by: family Name of family member notified: patient aware Patient and family notified of of transfer: 07/26/20  Discharge Plan and Services                          HH Arranged: RN, PT, OT [facility] Agency: [facility] Date [facility] Agency Contacted: 07/26/20   Representative spoke with at [facility] Agency: Feliberto Gottron  Social Determinants of Health (SDOH) Interventions     Readmission Risk Interventions No flowsheet data found.

## 2020-07-26 NOTE — Progress Notes (Signed)
Physical Therapy Treatment Patient Details Name: Dennis Preston MRN: [id number] DOB: Dec 19, 1950 Today's Date: 07/26/2020    History of Present Illness Pt is a 69 yo male that presented with ruptured L femoral artery pseudoaneurysm s/p repair.  PMH of bilateral femoral endarterectomy and stenting, COPD, CAD, past MI, PVD, CHF, anxiety, depression.    PT Comments    Pt seen this am. Received in bed on 1L O2 sats between 91-94%. Pt with 6/10 Left leg pain upon transferring to EOB with MinA.  Sitting EOB x 2 minutes due to c/o dizziness. (Pt stated pain meds make him dizzy.) Pt completed gait training with RW on level surface with CGA for safety, unable to progress to practice stairs due to increased c/o dizziness requiring return to room. Once sitting, dizziness resolved.  Sitting BP 128/60  HR 66bpm, Standing BP upon returning to room 150/70  HR 84.  Pt's wife present for session and aware of dizziness.  Overall good tolerance for PT, no LOB noted or increased SOB. Pt declined attempting activity without use of O2.   Follow Up Recommendations  Home health PT;Supervision for mobility/OOB     Equipment Recommendations  None recommended by PT    Recommendations for Other Services       Precautions / Restrictions Precautions Precautions: Fall Restrictions Weight Bearing Restrictions: No    Mobility  Bed Mobility Overal bed mobility: Needs Assistance Bed Mobility: Supine to Sit     Supine to sit: Min assist     General bed mobility comments:  (Due to discomfort)  Transfers Overall transfer level: Needs assistance Equipment used: Rolling walker (2 wheeled) Transfers: Sit to/from [employer] Sit to Stand: Min guard            Ambulation/Gait Ambulation/Gait assistance: Min guard Gait Distance (Feet): 80 Feet Assistive device: Rolling walker (2 wheeled) Gait Pattern/deviations: Shuffle Gait velocity: decreased   General Gait Details:  (2 standing  rest breaks due to dizziness and fatigue)   Stairs Stairs:  (anticipated attempting stairs, pt unable due to dizziness)           Wheelchair Mobility    Modified Rankin (Stroke Patients Only)       Balance Overall balance assessment: Needs assistance Sitting-balance support: Feet supported;No upper extremity supported Sitting balance-Leahy Scale: Good Sitting balance - Comments: Steady static/dynamic sitting, reaching within BOS.   Standing balance support: During functional activity;Bilateral upper extremity supported Standing balance-Leahy Scale: Fair Standing balance comment: Requires BUE support to maintain standing balance.                            Cognition Arousal/Alertness: Awake/alert Behavior During Therapy: WFL for tasks assessed/performed Overall Cognitive Status: Within Functional Limits for tasks assessed                                        Exercises      General Comments General comments (skin integrity, edema, etc.): Pt c/o dizziness requiring gait training and standing activities to be shortened and return to room.      Pertinent Vitals/Pain Pain Assessment: 0-10 Pain Score: 6  Pain Location: left leg Pain Descriptors / Indicators: Discomfort Pain Intervention(s): Monitored during session    Home Living  Prior Function            PT Goals (current goals can now be found in the care plan section) Acute Rehab PT Goals Patient Stated Goal: to go home Progress towards PT goals: Progressing toward goals    Frequency    Min 2X/week      PT Plan Current plan remains appropriate    Co-evaluation              AM-PAC PT "6 Clicks" Mobility   Outcome Measure  Help needed turning from your back to your side while in a flat bed without using bedrails?: A Little Help needed moving from lying on your back to sitting on the side of a flat bed without using bedrails?: A  Little Help needed moving to and from a bed to a chair (including a wheelchair)?: A Little Help needed standing up from a chair using your arms (e.g., wheelchair or bedside chair)?: A Little Help needed to walk in hospital room?: A Little Help needed climbing 3-5 steps with a railing? : A Little 6 Click Score: 18    End of Session Equipment Utilized During Treatment: Gait belt;Oxygen (Pt O2 sats 91-94% on 1L upon exertion. )       PT Visit Diagnosis: Other abnormalities of gait and mobility (R26.89);Muscle weakness (generalized) (M62.81);Pain Pain - Right/Left: Left Pain - part of body: Leg     Time: 0930-1015 PT Time Calculation (min) (ACUTE ONLY): 45 min  Charges:  $Gait Training: 8-22 mins $Therapeutic Exercise: 23-37 mins                     Zadie Cleverly, PTA   Jannet Askew 07/26/2020, 11:17 AM

## 2020-07-26 NOTE — Care Management Important Message (Signed)
Important Message  Patient Details  Name: Dennis Preston MRN: [id number] Date of Birth: May 27, 1951   Medicare Important Message Given:  Yes     Olegario Messier A Terence Bart 07/26/2020, 11:32 AM

## 2020-07-26 NOTE — Plan of Care (Signed)
  Problem: Health Behavior/Discharge Planning: Goal: Ability to manage health-related needs will improve Outcome: Progressing   Problem: Clinical Measurements: Goal: Ability to maintain clinical measurements within normal limits will improve Outcome: Progressing Goal: Will remain free from infection Outcome: Progressing   

## 2020-07-27 ENCOUNTER — Telehealth (INDEPENDENT_AMBULATORY_CARE_PROVIDER_SITE_OTHER): Payer: Self-pay

## 2020-07-27 NOTE — Telephone Encounter (Signed)
Dennis Preston called and left a on the Nurse's VM saying that the pt need as a Rx  For pain that he is Post Surgery of the Lt femoral pseudoaneurysm  and didn't get a Rx when he was discharged from the hospital.  The pt uses [facility] in [place].

## 2020-07-27 NOTE — Telephone Encounter (Signed)
The pt's wife called and left a VM on the nurse's line the pt is post Op of a left femoral pseudoaneurysm  Performed on October 24 th. The pt's wife says she is in need of bandages to change the pt's dressing per his  D/C instructions. Please advise.

## 2020-07-30 ENCOUNTER — Other Ambulatory Visit (INDEPENDENT_AMBULATORY_CARE_PROVIDER_SITE_OTHER): Payer: Self-pay | Admitting: Nurse Practitioner

## 2020-07-30 ENCOUNTER — Telehealth (INDEPENDENT_AMBULATORY_CARE_PROVIDER_SITE_OTHER): Payer: Self-pay

## 2020-07-30 NOTE — Telephone Encounter (Signed)
She should be able to get those from the home health agency.  If not we can give her some until she can obtain dressings from pharmacy

## 2020-07-30 NOTE — Telephone Encounter (Signed)
I called Amy pope with [facility] an made her aware of the Np's instructions.

## 2020-07-30 NOTE — Telephone Encounter (Signed)
Unfortunately this message was not received prior to my leaving Friday. It looks like an RX for percocet was sent into his pharmacy on 10/28.  He should be able to utilize that medication.

## 2020-07-30 NOTE — Telephone Encounter (Signed)
I called and made the pt's wife aware of the NP's instructions.

## 2020-07-30 NOTE — Telephone Encounter (Signed)
Amy from [facility] called and left a VM on the nurses line.  Amy is requesting orders to visit the pt 2 times a week for one week and 1 time a week for one week. Orders are also needed for the pt's  recertifacation next week for wound care with skilled nursing for continuation of these services . Only verbal orders are needed at this time . Please advise.

## 2020-07-30 NOTE — Telephone Encounter (Signed)
That is fine give them verbals from me for what they need

## 2020-07-30 NOTE — Telephone Encounter (Signed)
I spoke with the pt's wife and she confirmed the pt has his medication now.

## 2020-07-31 ENCOUNTER — Other Ambulatory Visit: Payer: Self-pay

## 2020-07-31 ENCOUNTER — Ambulatory Visit (INDEPENDENT_AMBULATORY_CARE_PROVIDER_SITE_OTHER): Payer: Medicare Other | Admitting: Vascular Surgery

## 2020-07-31 ENCOUNTER — Encounter (INDEPENDENT_AMBULATORY_CARE_PROVIDER_SITE_OTHER): Payer: Self-pay | Admitting: Vascular Surgery

## 2020-07-31 VITALS — BP 189/71 | HR 71 | Ht 67.0 in | Wt 160.0 lb

## 2020-07-31 DIAGNOSIS — I6523 Occlusion and stenosis of bilateral carotid arteries: Secondary | ICD-10-CM

## 2020-07-31 DIAGNOSIS — I724 Aneurysm of artery of lower extremity: Secondary | ICD-10-CM

## 2020-07-31 DIAGNOSIS — I70229 Atherosclerosis of native arteries of extremities with rest pain, unspecified extremity: Secondary | ICD-10-CM

## 2020-07-31 NOTE — Assessment & Plan Note (Signed)
Urgent repair a little over a week ago.  Has done very well from this.  Wound with some mild seroma formation which is not surprising.  Plan to follow-up in 2 weeks for wound check and likely staple removal.

## 2020-07-31 NOTE — Progress Notes (Signed)
Patient ID: Dennis Preston, male   DOB: 02/12/1951, 69 y.o.   MRN: [id number]  Chief Complaint  Patient presents with  . Follow-up    1 week [facility]     HPI Dennis Preston is a 69 y.o. male.  Patient returns in follow-up of his vascular disease.  He has previously undergone extensive bilateral lower extremity revascularizations a couple of months ago.  He had some wound issues on the right and had a debridement now that is almost completely healed.  A little over a week ago, he developed a pseudoaneurysm from the left groin and had to have an emergency repair.  He has done well since that time.  His wound has some fluid collection but no erythema, tenderness, or significant drainage is seen.  His staples remain in place.  No fevers or chills or signs of systemic infection.  He is still appropriately fatigued from his recent major surgery.  He does have numbness on the inside of the leg and we told him that is common.   Past Medical History:  Diagnosis Date  . Alcohol abuse   . Amaurosis fugax   . Atrial fibrillation (HCC)   . Carotid stenosis   . CHF (congestive heart failure) (HCC)   . Coagulopathy (HCC)   . COPD (chronic obstructive pulmonary disease) (HCC)   . Coronary artery disease   . Depression   . Hyperlipemia   . Hypotension   . Leucocytosis   . Liver dysfunction   . Myocardial infarction (HCC)    non-STEMI. approx 2016  . Peripheral artery disease (HCC)    legs  . Tobacco abuse   . Wears dentures    full upper and lower    Past Surgical History:  Procedure Laterality Date  . APPLICATION OF WOUND VAC Right 06/22/2020   Procedure: APPLICATION OF WOUND VAC;  Surgeon: Annice Needy, MD;  Location: [facility] ORS;  Service: Vascular;  Laterality: Right;  . CARDIAC CATHETERIZATION N/A 07/10/2015   Procedure: Right/Left Heart Cath and Coronary Angiography;  Surgeon: Alwyn Pea, MD;  Location: [facility] INVASIVE CV LAB;  Service: Cardiovascular;  Laterality: N/A;  . CORONARY  ARTERY BYPASS GRAFT  07/13/2015   [facility].  4 vessel  . ENDARTERECTOMY FEMORAL Bilateral 05/23/2020   Procedure: ENDARTERECTOMY FEMORAL BILATERAL SUPERFICIAL FEMORAL ARTERY STENTS ;  Surgeon: Renford Dills, MD;  Location: [facility] ORS;  Service: Vascular;  Laterality: Bilateral;  . FALSE ANEURYSM REPAIR Left 07/22/2020   Procedure: REPAIR LEFT FEMORAL PSEUDOANUERYSM;  Surgeon: Bertram Denver, MD;  Location: [facility] ORS;  Service: Vascular;  Laterality: Left;  . INSERTION OF ILIAC STENT Left 05/23/2020   Procedure: INSERTION OF ILIAC STENT;  Surgeon: Renford Dills, MD;  Location: [facility] ORS;  Service: Vascular;  Laterality: Left;  . LEFT HEART CATH AND CORS/GRAFTS ANGIOGRAPHY N/A 01/23/2020   Procedure: LEFT HEART CATH AND CORS/GRAFTS ANGIOGRAPHY;  Surgeon: Alwyn Pea, MD;  Location: [facility] INVASIVE CV LAB;  Service: Cardiovascular;  Laterality: N/A;  . LOWER EXTREMITY ANGIOGRAPHY Right 04/03/2020   Procedure: LOWER EXTREMITY ANGIOGRAPHY;  Surgeon: Renford Dills, MD;  Location: [facility] INVASIVE CV LAB;  Service: Cardiovascular;  Laterality: Right;  . OLECRANON BURSECTOMY Left 05/06/2019   Procedure: OLECRANON BURSECTOMY AND DEBRIDEMENT;  Surgeon: Signa Kell, MD;  Location: [facility] ORS;  Service: Orthopedics;  Laterality: Left;  . WOUND DEBRIDEMENT Right 06/22/2020   Procedure: DEBRIDEMENT WOUND RIGHT GROIN;  Surgeon: Annice Needy, MD;  Location: [facility] ORS;  Service:  Vascular;  Laterality: Right;      Allergies  Allergen Reactions  . Chlorhexidine     Current Outpatient Medications  Medication Sig Dispense Refill  . acetaminophen (TYLENOL) 325 MG tablet Take 650 mg by mouth every 6 (six) hours as needed for mild pain or headache.     . albuterol (PROVENTIL) (2.5 MG/3ML) 0.083% nebulizer solution Take 3 mLs (2.5 mg total) by nebulization every 6 (six) hours as needed for wheezing or shortness of breath. 360 mL 0  . albuterol (VENTOLIN HFA) 108 (90 Base) MCG/ACT inhaler Inhale 1-2 puffs into  the lungs every 6 (six) hours as needed for wheezing or shortness of breath. 8 g 0  . aspirin EC 81 MG tablet Take 1 tablet (81 mg total) by mouth daily. Swallow whole. 150 tablet 1  . atorvastatin (LIPITOR) 80 MG tablet Take 1 tablet (80 mg total) by mouth daily at 6 PM.    . clopidogrel (PLAVIX) 75 MG tablet Take 75 mg by mouth every evening.     . docusate sodium (COLACE) 100 MG capsule Take 1 capsule (100 mg total) by mouth 2 (two) times daily. 60 capsule 0  . Fluticasone-Umeclidin-Vilant (TRELEGY ELLIPTA) 100-62.5-25 MCG/INH AEPB Inhale 1 puff into the lungs every other day.     . furosemide (LASIX) 40 MG tablet Take 40 mg by mouth daily.     Marland Kitchen oxyCODONE-acetaminophen (PERCOCET/ROXICET) 5-325 MG tablet Take 1 tablet by mouth every 6 (six) hours as needed for moderate pain or severe pain. 28 tablet 0  . senna-docusate (SENOKOT-S) 8.6-50 MG tablet Take 1 tablet by mouth at bedtime as needed for mild constipation. 30 tablet 0   No current facility-administered medications for this visit.        Physical Exam BP (!) 189/71   Pulse 71   Ht 5\' 7"  (1.702 m)   Wt 160 lb (72.6 kg)   BMI 25.06 kg/m  Gen:  WD/WN, NAD Skin: incision C/D/I on the left with some seroma formation at the bottom of the wound although this is not tender or erythematous.  The right groin has about a nickel sized wound with a small amount of slough but overall good granulation tissue and no erythema or purulent drainage.     Assessment/Plan:  Atherosclerosis of artery of extremity with rest pain (HCC) Rest pain symptoms are improved after revascularization.  Has had wound issues on the right and a pseudoaneurysm on the left repaired which has slowed his recovery.  Will return in 2 weeks for staple removal and wound check.  Pseudoaneurysm of femoral artery (HCC) Urgent repair a little over a week ago.  Has done very well from this.  Wound with some mild seroma formation which is not surprising.  Plan to follow-up  in 2 weeks for wound check and likely staple removal.  Carotid atherosclerosis, bilateral Moderate to severe carotid disease is present.  Needs to recover from his lower extremity issues currently before considering any intervention or surgery on his carotid arteries.      07/31/2020, 9:36 AM   This note was created with Dragon medical transcription system.  Any errors from dictation are unintentional.

## 2020-07-31 NOTE — Assessment & Plan Note (Signed)
Rest pain symptoms are improved after revascularization.  Has had wound issues on the right and a pseudoaneurysm on the left repaired which has slowed his recovery.  Will return in 2 weeks for staple removal and wound check.

## 2020-07-31 NOTE — Assessment & Plan Note (Signed)
Moderate to severe carotid disease is present.  Needs to recover from his lower extremity issues currently before considering any intervention or surgery on his carotid arteries.

## 2020-08-09 ENCOUNTER — Telehealth: Payer: Self-pay | Admitting: *Deleted

## 2020-08-09 DIAGNOSIS — Z122 Encounter for screening for malignant neoplasm of respiratory organs: Secondary | ICD-10-CM

## 2020-08-09 DIAGNOSIS — Z87891 Personal history of nicotine dependence: Secondary | ICD-10-CM

## 2020-08-09 NOTE — Telephone Encounter (Signed)
Contacted patient to set up for annual CT lung cancer screening. Patient identified per policy with name and dob.  RN spoke with pt.  Confirmed that patient is within the age range for 40-80 and is asymptomatic.  He denies no signs or symptoms of lung cancer. He denies any new medical problems/illness that would prevent curative treatment for lung cancer. Verified patient's smoking history. Pt currently smokes 8 cigarettes per day. He does not vap or use smokeless tobacco.   Patient is agreeable to be scheduled for his CT lung scan. patient prefers to be scheduled on 08/30/2020 at 1015 am.

## 2020-08-10 NOTE — Addendum Note (Signed)
Addended by: Jonne Ply on: 08/10/2020 08:48 AM   Modules accepted: Orders

## 2020-08-10 NOTE — Telephone Encounter (Signed)
Current smoker, 54.5 pack year history

## 2020-08-13 ENCOUNTER — Telehealth (INDEPENDENT_AMBULATORY_CARE_PROVIDER_SITE_OTHER): Payer: Self-pay

## 2020-08-13 NOTE — Telephone Encounter (Signed)
Can we  please schedule per the Np.

## 2020-08-13 NOTE — Telephone Encounter (Signed)
We can get an ultrasound however it may require his appointment to move, talk with destiny

## 2020-08-13 NOTE — Telephone Encounter (Signed)
The pt called and left a message on the nurses line saying that he has an appointment tomorrow to have staples taken out  and would also like to have an U/S as well with his appointment due to having a knot above incision site on the Lt leg. The knot is swollen and and painful at times .  The pt says that he had this same problem 3 weeks ago.Please advise.

## 2020-08-14 ENCOUNTER — Encounter (INDEPENDENT_AMBULATORY_CARE_PROVIDER_SITE_OTHER): Payer: Self-pay | Admitting: Nurse Practitioner

## 2020-08-14 ENCOUNTER — Other Ambulatory Visit (INDEPENDENT_AMBULATORY_CARE_PROVIDER_SITE_OTHER): Payer: Self-pay | Admitting: Nurse Practitioner

## 2020-08-14 ENCOUNTER — Ambulatory Visit (INDEPENDENT_AMBULATORY_CARE_PROVIDER_SITE_OTHER): Payer: Medicare Other | Admitting: Nurse Practitioner

## 2020-08-14 ENCOUNTER — Ambulatory Visit (INDEPENDENT_AMBULATORY_CARE_PROVIDER_SITE_OTHER): Payer: Medicare Other

## 2020-08-14 ENCOUNTER — Other Ambulatory Visit: Payer: Self-pay

## 2020-08-14 VITALS — BP 170/71 | HR 74 | Ht 65.0 in | Wt 160.0 lb

## 2020-08-14 DIAGNOSIS — M7989 Other specified soft tissue disorders: Secondary | ICD-10-CM

## 2020-08-14 DIAGNOSIS — I724 Aneurysm of artery of lower extremity: Secondary | ICD-10-CM

## 2020-08-14 DIAGNOSIS — Z8679 Personal history of other diseases of the circulatory system: Secondary | ICD-10-CM

## 2020-08-14 DIAGNOSIS — R600 Localized edema: Secondary | ICD-10-CM | POA: Diagnosis not present

## 2020-08-14 DIAGNOSIS — I70229 Atherosclerosis of native arteries of extremities with rest pain, unspecified extremity: Secondary | ICD-10-CM

## 2020-08-14 NOTE — Telephone Encounter (Signed)
NP added groin pseudoaneurysm to visit ; called and scheduled patient

## 2020-08-19 ENCOUNTER — Encounter (INDEPENDENT_AMBULATORY_CARE_PROVIDER_SITE_OTHER): Payer: Self-pay | Admitting: Nurse Practitioner

## 2020-08-19 NOTE — Progress Notes (Signed)
Subjective:    Patient ID: Dennis Preston, male    DOB: 1951/07/07, 69 y.o.   MRN: [id number] Chief Complaint  Patient presents with  . Follow-up    2 wk staple removal     Patient presents today for staple removal following, a left common femoral artery pseudoaneurysm rupture repair.  The patient notes that he has been doing well since his recent discharge from the hospital.  The patient notes that there has been some continued swelling which concerned him given his history with the pseudoaneurysm.  The patient also had his right groin debrided previously and that wound is very nearly healed at this time.  He denies any signs symptoms of infection.  Wound appears to be clean dry and intact with no evidence of dehiscence.  There are sutures and staples in place.  Today noninvasive studies show no evidence of a pseudoaneurysm there is mostly a fluid-filled seroma from the groin or lateral to the surgical site extending 8.3 cm distally.  No evidence of blood flow in this area.  There is normal flow seen in the left common femoral artery as well as the mid SFA stent.   Review of Systems  Skin: Positive for wound.  All other systems reviewed and are negative.      Objective:   Physical Exam Vitals reviewed.  HENT:     Head: Normocephalic.  Cardiovascular:     Rate and Rhythm: Normal rate.     Pulses: Normal pulses.  Pulmonary:     Effort: Pulmonary effort is normal.  Neurological:     Mental Status: He is alert and oriented to person, place, and time.  Psychiatric:        Mood and Affect: Mood normal.        Behavior: Behavior normal.        Thought Content: Thought content normal.        Judgment: Judgment normal.     BP (!) 170/71   Pulse 74   Ht 5\' 5"  (1.651 m)   Wt 160 lb (72.6 kg)   BMI 26.63 kg/m   Past Medical History:  Diagnosis Date  . Alcohol abuse   . Amaurosis fugax   . Atrial fibrillation (HCC)   . Carotid stenosis   . CHF (congestive heart failure)  (HCC)   . Coagulopathy (HCC)   . COPD (chronic obstructive pulmonary disease) (HCC)   . Coronary artery disease   . Depression   . Hyperlipemia   . Hypotension   . Leucocytosis   . Liver dysfunction   . Myocardial infarction (HCC)    non-STEMI. approx 2016  . Peripheral artery disease (HCC)    legs  . Tobacco abuse   . Wears dentures    full upper and lower    Social History   Socioeconomic History  . Marital status: Married    Spouse name: Not on file  . Number of children: Not on file  . Years of education: Not on file  . Highest education level: Not on file  Occupational History  . Not on file  Tobacco Use  . Smoking status: Former Smoker    Packs/day: 1.00    Years: 54.00    Pack years: 54.00    Types: Cigarettes    Quit date: 03/16/2020    Years since quitting: 0.4  . Smokeless tobacco: Never Used  . Tobacco comment: was 2.5 PPD for 50 yrs until 2016, quit a "few days ago"  Vaping  Use  . Vaping Use: Never used  Substance and Sexual Activity  . Alcohol use: Not Currently    Comment: quit over 15 yrs ago  . Drug use: Never  . Sexual activity: Not on file  Other Topics Concern  . Not on file  Social History Narrative  . Not on file   Social Determinants of Health   Financial Resource Strain:   . Difficulty of Paying Living Expenses: Not on file  Food Insecurity:   . Worried About Programme researcher, broadcasting/film/video in the Last Year: Not on file  . Ran Out of Food in the Last Year: Not on file  Transportation Needs:   . Lack of Transportation (Medical): Not on file  . Lack of Transportation (Non-Medical): Not on file  Physical Activity:   . Days of Exercise per Week: Not on file  . Minutes of Exercise per Session: Not on file  Stress:   . Feeling of Stress : Not on file  Social Connections:   . Frequency of Communication with Friends and Family: Not on file  . Frequency of Social Gatherings with Friends and Family: Not on file  . Attends Religious Services: Not on  file  . Active Member of Clubs or Organizations: Not on file  . Attends Banker Meetings: Not on file  . Marital Status: Not on file  Intimate Partner Violence:   . Fear of Current or Ex-Partner: Not on file  . Emotionally Abused: Not on file  . Physically Abused: Not on file  . Sexually Abused: Not on file    Past Surgical History:  Procedure Laterality Date  . APPLICATION OF WOUND VAC Right 06/22/2020   Procedure: APPLICATION OF WOUND VAC;  Surgeon: Annice Needy, MD;  Location: [facility];  Service: Vascular;  Laterality: Right;  . CARDIAC CATHETERIZATION N/A 07/10/2015   Procedure: Right/Left Heart Cath and Coronary Angiography;  Surgeon: Alwyn Pea, MD;  Location: [facility];  Service: Cardiovascular;  Laterality: N/A;  . CORONARY ARTERY BYPASS GRAFT  07/13/2015   [facility].  4 vessel  . ENDARTERECTOMY FEMORAL Bilateral 05/23/2020   Procedure: ENDARTERECTOMY FEMORAL BILATERAL SUPERFICIAL FEMORAL ARTERY STENTS ;  Surgeon: Renford Dills, MD;  Location: [facility];  Service: Vascular;  Laterality: Bilateral;  . FALSE ANEURYSM REPAIR Left 07/22/2020   Procedure: REPAIR LEFT FEMORAL PSEUDOANUERYSM;  Surgeon: Bertram Denver, MD;  Location: [facility];  Service: Vascular;  Laterality: Left;  . INSERTION OF ILIAC STENT Left 05/23/2020   Procedure: INSERTION OF ILIAC STENT;  Surgeon: Renford Dills, MD;  Location: [facility];  Service: Vascular;  Laterality: Left;  . LEFT HEART CATH AND CORS/GRAFTS ANGIOGRAPHY N/A 01/23/2020   Procedure: LEFT HEART CATH AND CORS/GRAFTS ANGIOGRAPHY;  Surgeon: Alwyn Pea, MD;  Location: [facility];  Service: Cardiovascular;  Laterality: N/A;  . LOWER EXTREMITY ANGIOGRAPHY Right 04/03/2020   Procedure: LOWER EXTREMITY ANGIOGRAPHY;  Surgeon: Renford Dills, MD;  Location: [facility];  Service: Cardiovascular;  Laterality: Right;  . OLECRANON BURSECTOMY Left 05/06/2019   Procedure: OLECRANON BURSECTOMY AND  DEBRIDEMENT;  Surgeon: Signa Kell, MD;  Location: [facility];  Service: Orthopedics;  Laterality: Left;  . WOUND DEBRIDEMENT Right 06/22/2020   Procedure: DEBRIDEMENT WOUND RIGHT GROIN;  Surgeon: Annice Needy, MD;  Location: [facility];  Service: Vascular;  Laterality: Right;    Family History  Problem Relation Age of Onset  . Depression Sister     Allergies  Allergen Reactions  . Chlorhexidine     CBC Latest Ref Rng & Units 07/25/2020 07/24/2020 07/23/2020  WBC 4.0 - 10.5 K/uL 8.0 8.8 5.8  Hemoglobin 13.0 - 17.0 g/dL 11.1(L) 10.1(L) 10.3(L)  Hematocrit 39 - 52 % 33.8(L) 31.5(L) 31.9(L)  Platelets 150 - 400 K/uL 165 142(L) 150      CMP     Component Value Date/Time   NA 139 07/25/2020 0411   K 4.2 07/25/2020 0411   CL 99 07/25/2020 0411   CO2 33 (H) 07/25/2020 0411   GLUCOSE 91 07/25/2020 0411   BUN 11 07/25/2020 0411   CREATININE 0.77 07/25/2020 0411   CALCIUM 8.8 (L) 07/25/2020 0411   PROT 5.6 (L) 07/22/2020 2112   ALBUMIN 3.7 07/22/2020 2112   AST 16 07/22/2020 2112   ALT 12 07/22/2020 2112   ALKPHOS 63 07/22/2020 2112   BILITOT 0.5 07/22/2020 2112   GFRNONAA >60 07/25/2020 0411   GFRAA >60 06/22/2020 1036     VAS [place] ABI WITH/WO TBI  Result Date: 07/09/2020 LOWER EXTREMITY DOPPLER STUDY Indications: Peripheral artery disease.  Vascular Interventions: 04/03/20: Right profunda femoral artery PTA;                         05/23/20: Left EIA, bilateral SFA & bilateral popliteal                         artery stents with bilateral CFA/SFA/PFA                         endarterectomies;. Performing Technologist: Jamse Mead RT, RDMS, RVT  Examination Guidelines: A complete evaluation includes at minimum, Doppler waveform signals and systolic blood pressure reading at the level of bilateral brachial, anterior tibial, and posterior tibial arteries, when vessel segments are accessible. Bilateral testing is considered an integral part of a complete examination. Photoelectric  Plethysmograph (PPG) waveforms and toe systolic pressure readings are included as required and additional duplex testing as needed. Limited examinations for reoccurring indications may be performed as noted.  ABI Findings: +---------+------------------+-----+--------+---------------------------+ Right    Rt Pressure (mmHg)IndexWaveformComment                     +---------+------------------+-----+--------+---------------------------+ Brachial 148                                                        +---------+------------------+-----+--------+---------------------------+ Popliteal                       biphasicpatent SFA/popliteal stents +---------+------------------+-----+--------+---------------------------+ ATA      157               1.06 biphasic                            +---------+------------------+-----+--------+---------------------------+ PTA      170               1.15 biphasic                            +---------+------------------+-----+--------+---------------------------+ Great Toe104               0.70  Normal                              +---------+------------------+-----+--------+---------------------------+ +---------+------------------+-----+--------+---------------------------+ Left     Lt Pressure (mmHg)IndexWaveformComment                     +---------+------------------+-----+--------+---------------------------+ Brachial 108                            >5830mm/Mg less than right    +---------+------------------+-----+--------+---------------------------+ Popliteal                       biphasicpatent SFA/popliteal stents +---------+------------------+-----+--------+---------------------------+ ATA      153               1.03 biphasic                            +---------+------------------+-----+--------+---------------------------+ PTA      174               1.18 biphasic                             +---------+------------------+-----+--------+---------------------------+ Great Toe111               0.75 Normal                              +---------+------------------+-----+--------+---------------------------+ +-------+-----------+-----------+------------+------------+ ABI/TBIToday's ABIToday's TBIPrevious ABIPrevious TBI +-------+-----------+-----------+------------+------------+ Right  1.15       0.70       0.71        0.57         +-------+-----------+-----------+------------+------------+ Left   1.18       0.75       0.68        0.47         +-------+-----------+-----------+------------+------------+ Bilateral ABIs and TBIs appear increased compared to prior study on 04/27/20. No significant change in bilateral brachial pressures either.  Summary: Bilateral: Bilateral ankle-brachial indexes are within normal range. No evidence of significant lower extremity arterial disease. 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  *See table(s) above for measurements and observations.  Electronically signed by Levora DredgeGregory Schnier MD on 07/09/2020 at 4:51:01 PM.   Final        Assessment & Plan:   1. Pseudoaneurysm of femoral artery (HCC) Pseudoaneurysm not detected today via ultrasound.  However there is a seroma within that area.  Previously placed stents are patent.  All staples removed from the area today as well as sutures and the wound is clean dry and intact with no evidence of dehiscence.  Steri-Strips were applied.  The patient's right groin has closed greatly and just has a small open area.  Patient is advised to continue with current wound care.  We will plan on having the patient back in 6 weeks to repeat noninvasive studies to measure size of pseudoaneurysm as well as to reevaluate wounds.  2. Atherosclerosis of artery of extremity with  rest pain (HCC) Patient's most recent noninvasive studies great improvement post endarterectomy. We will obtain repeat studies in 6 month or sooner if issues arise.    Current Outpatient Medications on File  Prior to Visit  Medication Sig Dispense Refill  . acetaminophen (TYLENOL) 325 MG tablet Take 650 mg by mouth every 6 (six) hours as needed for mild pain or headache.     . albuterol (PROVENTIL) (2.5 MG/3ML) 0.083% nebulizer solution Take 3 mLs (2.5 mg total) by nebulization every 6 (six) hours as needed for wheezing or shortness of breath. 360 mL 0  . albuterol (VENTOLIN HFA) 108 (90 Base) MCG/ACT inhaler Inhale 1-2 puffs into the lungs every 6 (six) hours as needed for wheezing or shortness of breath. 8 g 0  . aspirin EC 81 MG tablet Take 1 tablet (81 mg total) by mouth daily. Swallow whole. 150 tablet 1  . atorvastatin (LIPITOR) 80 MG tablet Take 1 tablet (80 mg total) by mouth daily at 6 PM.    . clopidogrel (PLAVIX) 75 MG tablet Take 75 mg by mouth every evening.     . docusate sodium (COLACE) 100 MG capsule Take 1 capsule (100 mg total) by mouth 2 (two) times daily. 60 capsule 0  . Fluticasone-Umeclidin-Vilant (TRELEGY ELLIPTA) 100-62.5-25 MCG/INH AEPB Inhale 1 puff into the lungs every other day.     . furosemide (LASIX) 40 MG tablet Take 40 mg by mouth daily.     Marland Kitchen oxyCODONE-acetaminophen (PERCOCET/ROXICET) 5-325 MG tablet Take 1 tablet by mouth every 6 (six) hours as needed for moderate pain or severe pain. 28 tablet 0  . senna-docusate (SENOKOT-S) 8.6-50 MG tablet Take 1 tablet by mouth at bedtime as needed for mild constipation. 30 tablet 0   No current facility-administered medications on file prior to visit.    There are no Patient Instructions on file for this visit. No follow-ups on file.   Georgiana Spinner, NP

## 2020-08-29 ENCOUNTER — Telehealth (INDEPENDENT_AMBULATORY_CARE_PROVIDER_SITE_OTHER): Payer: Self-pay

## 2020-08-30 ENCOUNTER — Other Ambulatory Visit (INDEPENDENT_AMBULATORY_CARE_PROVIDER_SITE_OTHER): Payer: Self-pay | Admitting: Nurse Practitioner

## 2020-08-30 ENCOUNTER — Other Ambulatory Visit: Payer: Self-pay

## 2020-08-30 ENCOUNTER — Ambulatory Visit
Admission: RE | Admit: 2020-08-30 | Discharge: 2020-08-30 | Disposition: A | Discharge status: Home / Self Care | Payer: Medicare Other | Source: Ambulatory Visit | Attending: Nurse Practitioner | Admitting: Nurse Practitioner

## 2020-08-30 DIAGNOSIS — Z87891 Personal history of nicotine dependence: Secondary | ICD-10-CM | POA: Diagnosis not present

## 2020-08-30 DIAGNOSIS — Z122 Encounter for screening for malignant neoplasm of respiratory organs: Secondary | ICD-10-CM | POA: Insufficient documentation

## 2020-08-30 MED ORDER — GABAPENTIN 300 MG PO CAPS: 300.0000 mg | ORAL_CAPSULE | Freq: Every day | ORAL | 0 refills | Status: DC

## 2020-08-30 NOTE — Telephone Encounter (Signed)
Patient has been made aware with medical advice and rx was sent into pharmacy

## 2020-08-30 NOTE — Telephone Encounter (Signed)
I sent in an RX for Gabapentin.  We will start with 300 mg at bedtime (this medication can make you sleepy).  It is also a medication that doesn't work instantly, it may take a week or two to feel the effects.  We can discuss how effective it is and if adjustments are needed at his follow up

## 2020-09-06 ENCOUNTER — Emergency Department: Payer: Medicare Other

## 2020-09-06 ENCOUNTER — Other Ambulatory Visit: Payer: Self-pay

## 2020-09-06 ENCOUNTER — Inpatient Hospital Stay
Admission: EM | Admit: 2020-09-06 | Discharge: 2020-09-08 | DRG: 190 | Disposition: A | Discharge status: Home / Self Care | Payer: Medicare Other | Attending: Obstetrics and Gynecology | Admitting: Obstetrics and Gynecology

## 2020-09-06 DIAGNOSIS — Z7951 Long term (current) use of inhaled steroids: Secondary | ICD-10-CM | POA: Diagnosis not present

## 2020-09-06 DIAGNOSIS — Z818 Family history of other mental and behavioral disorders: Secondary | ICD-10-CM

## 2020-09-06 DIAGNOSIS — Z20822 Contact with and (suspected) exposure to covid-19: Secondary | ICD-10-CM | POA: Diagnosis present

## 2020-09-06 DIAGNOSIS — Z79899 Other long term (current) drug therapy: Secondary | ICD-10-CM

## 2020-09-06 DIAGNOSIS — I2510 Atherosclerotic heart disease of native coronary artery without angina pectoris: Secondary | ICD-10-CM | POA: Diagnosis present

## 2020-09-06 DIAGNOSIS — Z951 Presence of aortocoronary bypass graft: Secondary | ICD-10-CM

## 2020-09-06 DIAGNOSIS — Z888 Allergy status to other drugs, medicaments and biological substances status: Secondary | ICD-10-CM | POA: Diagnosis not present

## 2020-09-06 DIAGNOSIS — J9601 Acute respiratory failure with hypoxia: Secondary | ICD-10-CM | POA: Diagnosis present

## 2020-09-06 DIAGNOSIS — E876 Hypokalemia: Secondary | ICD-10-CM | POA: Diagnosis present

## 2020-09-06 DIAGNOSIS — Z7982 Long term (current) use of aspirin: Secondary | ICD-10-CM

## 2020-09-06 DIAGNOSIS — I5033 Acute on chronic diastolic (congestive) heart failure: Secondary | ICD-10-CM | POA: Diagnosis present

## 2020-09-06 DIAGNOSIS — F17210 Nicotine dependence, cigarettes, uncomplicated: Secondary | ICD-10-CM | POA: Diagnosis present

## 2020-09-06 DIAGNOSIS — I4891 Unspecified atrial fibrillation: Secondary | ICD-10-CM | POA: Diagnosis present

## 2020-09-06 DIAGNOSIS — I509 Heart failure, unspecified: Secondary | ICD-10-CM

## 2020-09-06 DIAGNOSIS — J9600 Acute respiratory failure, unspecified whether with hypoxia or hypercapnia: Secondary | ICD-10-CM | POA: Diagnosis not present

## 2020-09-06 DIAGNOSIS — E785 Hyperlipidemia, unspecified: Secondary | ICD-10-CM | POA: Diagnosis present

## 2020-09-06 DIAGNOSIS — I252 Old myocardial infarction: Secondary | ICD-10-CM

## 2020-09-06 DIAGNOSIS — I739 Peripheral vascular disease, unspecified: Secondary | ICD-10-CM | POA: Diagnosis present

## 2020-09-06 DIAGNOSIS — I480 Paroxysmal atrial fibrillation: Secondary | ICD-10-CM | POA: Diagnosis not present

## 2020-09-06 DIAGNOSIS — J441 Chronic obstructive pulmonary disease with (acute) exacerbation: Principal | ICD-10-CM | POA: Diagnosis present

## 2020-09-06 DIAGNOSIS — I5031 Acute diastolic (congestive) heart failure: Secondary | ICD-10-CM

## 2020-09-06 DIAGNOSIS — Z7902 Long term (current) use of antithrombotics/antiplatelets: Secondary | ICD-10-CM

## 2020-09-06 DIAGNOSIS — R0602 Shortness of breath: Secondary | ICD-10-CM | POA: Diagnosis present

## 2020-09-06 DIAGNOSIS — Z720 Tobacco use: Secondary | ICD-10-CM | POA: Diagnosis present

## 2020-09-06 LAB — CBC
HCT: 43.7 % (ref 39.0–52.0)
Hemoglobin: 13.8 g/dL (ref 13.0–17.0)
MCH: 28.6 pg (ref 26.0–34.0)
MCHC: 31.6 g/dL (ref 30.0–36.0)
MCV: 90.7 fL (ref 80.0–100.0)
Platelets: 262 10*3/uL (ref 150–400)
RBC: 4.82 MIL/uL (ref 4.22–5.81)
RDW: 15.6 % — ABNORMAL HIGH (ref 11.5–15.5)
WBC: 7.2 10*3/uL (ref 4.0–10.5)
nRBC: 0 % (ref 0.0–0.2)

## 2020-09-06 LAB — BLOOD GAS, VENOUS
Acid-Base Excess: 6 mmol/L — ABNORMAL HIGH (ref 0.0–2.0)
Bicarbonate: 34 mmol/L — ABNORMAL HIGH (ref 20.0–28.0)
O2 Saturation: 33.2 %
Patient temperature: 37
pCO2, Ven: 63 mmHg — ABNORMAL HIGH (ref 44.0–60.0)
pH, Ven: 7.34 (ref 7.250–7.430)
pO2, Ven: <31 mmHg — CL (ref 32.0–45.0)

## 2020-09-06 LAB — BASIC METABOLIC PANEL
Anion gap: 11 (ref 5–15)
BUN: 11 mg/dL (ref 8–23)
CO2: 30 mmol/L (ref 22–32)
Calcium: 8.7 mg/dL — ABNORMAL LOW (ref 8.9–10.3)
Chloride: 101 mmol/L (ref 98–111)
Creatinine, Ser: 1.01 mg/dL (ref 0.61–1.24)
GFR, Estimated: >60 mL/min (ref >60)
Glucose, Bld: 139 mg/dL — ABNORMAL HIGH (ref 70–99)
Potassium: 3.4 mmol/L — ABNORMAL LOW (ref 3.5–5.1)
Sodium: 142 mmol/L (ref 135–145)

## 2020-09-06 LAB — CBC WITH DIFFERENTIAL/PLATELET
Abs Immature Granulocytes: 0.02 10*3/uL (ref 0.00–0.07)
Basophils Absolute: 0 10*3/uL (ref 0.0–0.1)
Basophils Relative: 0 %
Eosinophils Absolute: 0 10*3/uL (ref 0.0–0.5)
Eosinophils Relative: 0 %
HCT: 39.2 % (ref 39.0–52.0)
Hemoglobin: 12.5 g/dL — ABNORMAL LOW (ref 13.0–17.0)
Immature Granulocytes: 0 %
Lymphocytes Relative: 6 %
Lymphs Abs: 0.4 10*3/uL — ABNORMAL LOW (ref 0.7–4.0)
MCH: 28.7 pg (ref 26.0–34.0)
MCHC: 31.9 g/dL (ref 30.0–36.0)
MCV: 89.9 fL (ref 80.0–100.0)
Monocytes Absolute: 0.4 10*3/uL (ref 0.1–1.0)
Monocytes Relative: 7 %
Neutro Abs: 5.3 10*3/uL (ref 1.7–7.7)
Neutrophils Relative %: 87 %
Platelets: 277 10*3/uL (ref 150–400)
RBC: 4.36 MIL/uL (ref 4.22–5.81)
RDW: 15.6 % — ABNORMAL HIGH (ref 11.5–15.5)
Smear Review: NORMAL
WBC: 6.1 10*3/uL (ref 4.0–10.5)
nRBC: 0 % (ref 0.0–0.2)

## 2020-09-06 LAB — RESP PANEL BY RT-PCR (FLU A&B, COVID) ARPGX2
Influenza A by PCR: NEGATIVE
Influenza B by PCR: NEGATIVE
SARS Coronavirus 2 by RT PCR: NEGATIVE

## 2020-09-06 LAB — TROPONIN I (HIGH SENSITIVITY)
Troponin I (High Sensitivity): 19 ng/L — ABNORMAL HIGH (ref <18)
Troponin I (High Sensitivity): 22 ng/L — ABNORMAL HIGH (ref <18)

## 2020-09-06 LAB — BRAIN NATRIURETIC PEPTIDE: B Natriuretic Peptide: 73.4 pg/mL (ref 0.0–100.0)

## 2020-09-06 MED ORDER — GUAIFENESIN-DM 100-10 MG/5ML PO SYRP
5.0000 mL | ORAL_SOLUTION | ORAL | Status: DC | PRN
  Administered 2020-09-06 – 2020-09-08 (×3): 5 mL via ORAL
  Filled 2020-09-06 (×3): qty 5

## 2020-09-06 MED ORDER — ACETAMINOPHEN 325 MG PO TABS: 650.0000 mg | ORAL_TABLET | Freq: Four times a day (QID) | ORAL | Status: DC | PRN

## 2020-09-06 MED ORDER — CLOPIDOGREL BISULFATE 75 MG PO TABS
75.0000 mg | ORAL_TABLET | Freq: Every evening | ORAL | Status: DC
  Administered 2020-09-06 – 2020-09-07 (×2): 75 mg via ORAL
  Filled 2020-09-06 (×2): qty 1

## 2020-09-06 MED ORDER — SODIUM CHLORIDE 0.9% FLUSH: 3.0000 mL | INTRAVENOUS | Status: DC | PRN

## 2020-09-06 MED ORDER — SODIUM CHLORIDE 0.9 % IV SOLN
1.0000 g | INTRAVENOUS | Status: DC
  Administered 2020-09-06 – 2020-09-07 (×2): 1 g via INTRAVENOUS
  Filled 2020-09-06 (×3): qty 10

## 2020-09-06 MED ORDER — METOPROLOL SUCCINATE ER 25 MG PO TB24
25.0000 mg | ORAL_TABLET | Freq: Every day | ORAL | Status: DC
  Administered 2020-09-06 – 2020-09-08 (×3): 25 mg via ORAL
  Filled 2020-09-06 (×3): qty 1

## 2020-09-06 MED ORDER — POTASSIUM CHLORIDE CRYS ER 20 MEQ PO TBCR
40.0000 meq | EXTENDED_RELEASE_TABLET | Freq: Once | ORAL | Status: AC
  Administered 2020-09-06: 40 meq via ORAL
  Filled 2020-09-06: qty 2

## 2020-09-06 MED ORDER — GABAPENTIN 300 MG PO CAPS
300.0000 mg | ORAL_CAPSULE | Freq: Every day | ORAL | Status: DC
  Administered 2020-09-06 – 2020-09-07 (×2): 300 mg via ORAL
  Filled 2020-09-06 (×2): qty 1

## 2020-09-06 MED ORDER — IPRATROPIUM-ALBUTEROL 0.5-2.5 (3) MG/3ML IN SOLN
3.0000 mL | Freq: Once | RESPIRATORY_TRACT | Status: AC
  Administered 2020-09-06: 3 mL via RESPIRATORY_TRACT

## 2020-09-06 MED ORDER — IPRATROPIUM-ALBUTEROL 0.5-2.5 (3) MG/3ML IN SOLN
3.0000 mL | Freq: Four times a day (QID) | RESPIRATORY_TRACT | Status: DC
  Administered 2020-09-06 – 2020-09-08 (×9): 3 mL via RESPIRATORY_TRACT
  Filled 2020-09-06 (×9): qty 3

## 2020-09-06 MED ORDER — PREDNISONE 20 MG PO TABS
40.0000 mg | ORAL_TABLET | Freq: Every day | ORAL | Status: DC
  Administered 2020-09-07 – 2020-09-08 (×2): 40 mg via ORAL
  Filled 2020-09-06 (×2): qty 2

## 2020-09-06 MED ORDER — FUROSEMIDE 10 MG/ML IJ SOLN
40.0000 mg | Freq: Once | INTRAMUSCULAR | Status: AC
  Administered 2020-09-06: 40 mg via INTRAVENOUS
  Filled 2020-09-06: qty 4

## 2020-09-06 MED ORDER — MELATONIN 5 MG PO TABS
5.0000 mg | ORAL_TABLET | Freq: Every evening | ORAL | Status: DC | PRN
  Administered 2020-09-07 (×2): 5 mg via ORAL
  Filled 2020-09-06 (×2): qty 1

## 2020-09-06 MED ORDER — POTASSIUM CHLORIDE 10 MEQ/100ML IV SOLN
10.0000 meq | Freq: Once | INTRAVENOUS | Status: AC
  Administered 2020-09-06: 10 meq via INTRAVENOUS
  Filled 2020-09-06: qty 100

## 2020-09-06 MED ORDER — ONDANSETRON HCL 4 MG PO TABS: 4.0000 mg | ORAL_TABLET | Freq: Four times a day (QID) | ORAL | Status: DC | PRN

## 2020-09-06 MED ORDER — FLUTICASONE-UMECLIDIN-VILANT 100-62.5-25 MCG/INH IN AEPB: 1.0000 | INHALATION_SPRAY | RESPIRATORY_TRACT | Status: DC

## 2020-09-06 MED ORDER — METHYLPREDNISOLONE SODIUM SUCC 40 MG IJ SOLR
40.0000 mg | Freq: Four times a day (QID) | INTRAMUSCULAR | Status: AC
  Administered 2020-09-06 – 2020-09-07 (×4): 40 mg via INTRAVENOUS
  Filled 2020-09-06 (×4): qty 1

## 2020-09-06 MED ORDER — METHYLPREDNISOLONE SODIUM SUCC 40 MG IJ SOLR: 40.0000 mg | Freq: Two times a day (BID) | INTRAMUSCULAR | Status: DC

## 2020-09-06 MED ORDER — ONDANSETRON HCL 4 MG/2ML IJ SOLN
4.0000 mg | Freq: Four times a day (QID) | INTRAMUSCULAR | Status: DC | PRN
  Administered 2020-09-07: 4 mg via INTRAVENOUS
  Filled 2020-09-06: qty 2

## 2020-09-06 MED ORDER — FUROSEMIDE 10 MG/ML IJ SOLN
40.0000 mg | Freq: Every day | INTRAMUSCULAR | Status: DC
  Administered 2020-09-07: 40 mg via INTRAVENOUS
  Filled 2020-09-06: qty 4

## 2020-09-06 MED ORDER — MAGNESIUM SULFATE 2 GM/50ML IV SOLN
2.0000 g | Freq: Once | INTRAVENOUS | Status: AC
  Administered 2020-09-06: 2 g via INTRAVENOUS
  Filled 2020-09-06: qty 50

## 2020-09-06 MED ORDER — FLUTICASONE FUROATE-VILANTEROL 100-25 MCG/INH IN AEPB
1.0000 | INHALATION_SPRAY | RESPIRATORY_TRACT | Status: DC
  Administered 2020-09-07: 1 via RESPIRATORY_TRACT
  Filled 2020-09-06: qty 28

## 2020-09-06 MED ORDER — ATORVASTATIN CALCIUM 80 MG PO TABS
80.0000 mg | ORAL_TABLET | Freq: Every day | ORAL | Status: DC
  Administered 2020-09-06 – 2020-09-07 (×2): 80 mg via ORAL
  Filled 2020-09-06 (×2): qty 1

## 2020-09-06 MED ORDER — ALBUTEROL SULFATE (2.5 MG/3ML) 0.083% IN NEBU: 2.5000 mg | INHALATION_SOLUTION | RESPIRATORY_TRACT | Status: DC | PRN

## 2020-09-06 MED ORDER — SODIUM CHLORIDE 0.9 % IV SOLN: 250.0000 mL | INTRAVENOUS | Status: DC | PRN

## 2020-09-06 MED ORDER — UMECLIDINIUM BROMIDE 62.5 MCG/INH IN AEPB
1.0000 | INHALATION_SPRAY | RESPIRATORY_TRACT | Status: DC
  Administered 2020-09-07: 08:00:00 1 via RESPIRATORY_TRACT
  Filled 2020-09-06: qty 7

## 2020-09-06 MED ORDER — ASPIRIN EC 81 MG PO TBEC
81.0000 mg | DELAYED_RELEASE_TABLET | Freq: Every day | ORAL | Status: DC
  Administered 2020-09-06 – 2020-09-08 (×3): 81 mg via ORAL
  Filled 2020-09-06 (×3): qty 1

## 2020-09-06 MED ORDER — SODIUM CHLORIDE 0.9% FLUSH
3.0000 mL | Freq: Two times a day (BID) | INTRAVENOUS | Status: DC
  Administered 2020-09-06 – 2020-09-07 (×4): 3 mL via INTRAVENOUS

## 2020-09-06 MED ORDER — LISINOPRIL 10 MG PO TABS
10.0000 mg | ORAL_TABLET | Freq: Every day | ORAL | Status: DC
  Administered 2020-09-06 – 2020-09-08 (×3): 10 mg via ORAL
  Filled 2020-09-06 (×3): qty 1

## 2020-09-06 MED ORDER — PREDNISONE 20 MG PO TABS: 40.0000 mg | ORAL_TABLET | Freq: Every day | ORAL | Status: DC

## 2020-09-06 MED ORDER — ENOXAPARIN SODIUM 40 MG/0.4ML ~~LOC~~ SOLN: 40.0000 mg | SUBCUTANEOUS | Status: DC

## 2020-09-06 MED ORDER — SODIUM CHLORIDE 0.9 % IV SOLN
500.0000 mg | Freq: Once | INTRAVENOUS | Status: AC
  Administered 2020-09-06: 500 mg via INTRAVENOUS
  Filled 2020-09-06: qty 500

## 2020-09-06 MED ORDER — ENOXAPARIN SODIUM 40 MG/0.4ML ~~LOC~~ SOLN
40.0000 mg | SUBCUTANEOUS | Status: DC
  Administered 2020-09-06 – 2020-09-08 (×3): 40 mg via SUBCUTANEOUS
  Filled 2020-09-06 (×3): qty 0.4

## 2020-09-06 NOTE — ED Notes (Signed)
Pt continues to tolerate 4L [place] , pt is a/ox4. SR noted on the monitor. Pt eating [place] sandwich tray, pt did not receive a meal from dietary today.

## 2020-09-06 NOTE — Evaluation (Signed)
Physical Therapy Evaluation Patient Details Name: Dennis Preston MRN: [id number] DOB: 1951-01-17 Today's Date: 09/06/2020   History of Present Illness  Pt is a 69 y.o. 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 fib, anxiety, depression, and nicotine dependence who presents to the ER for evaluation of shortness of breath.  MD assessment includes: Acute respiratory failure, acute diastolic dysfunction, hypokalemia, and acute COPD exacerbation.    Clinical Impression  Pt was pleasant and motivated to participate during the session and overall performed very well.  Pt was on 4LO2/min during the session with SpO2 remaining mostly in the low to mid 90s with one momentary drop to 88% that quickly returned to baseline with cues for PLB.  Pt was steady with transfers and gait and was able to amb >100 feet with good sequencing with the SPC.  Pt did not require physical assistance at any point during the session and should make good progress towards goals while in acute care.  Will keep pt on PT caseload while hospitalized to address below deficits but anticipate patient will not require continued PT services upon discharge.       Follow Up Recommendations No PT follow up    Equipment Recommendations  None recommended by PT    Recommendations for Other Services       Precautions / Restrictions Precautions Precautions: Fall Restrictions Weight Bearing Restrictions: No Other Position/Activity Restrictions: SpO2 >/= 88%, HOB >/= 30 deg      Mobility  Bed Mobility Overal bed mobility: Modified Independent Bed Mobility: Supine to Sit;Sit to Supine     Supine to sit: Supervision Sit to supine: Supervision   General bed mobility comments: Min extra time/effort but no physical assist or bed rail required     Transfers Overall transfer level: Modified independent Equipment used: Straight cane Transfers: Sit to/from Stand Sit to Stand: Modified independent (Device/Increase time)         General transfer comment: Good control and stability during sit to/from stand  Ambulation/Gait Ambulation/Gait assistance: Supervision Gait Distance (Feet): 125 Feet Assistive device: Straight cane Gait Pattern/deviations: Step-through pattern;Decreased step length - right;Decreased step length - left Gait velocity: decreased   General Gait Details: mildly decreased cadence but steady with SpO2 on 4LO2/min remaining in the low to mid 90s for the majority of the time with momentary low of 88%  Stairs            Wheelchair Mobility    Modified Rankin (Stroke Patients Only)       Balance Overall balance assessment: Needs assistance Sitting-balance support: No upper extremity supported;Feet supported Sitting balance-Leahy Scale: Normal     Standing balance support: Single extremity supported Standing balance-Leahy Scale: Good                               Pertinent Vitals/Pain Pain Assessment: No/denies pain    Home Living Family/patient expects to be discharged to:: Private residence Living Arrangements: Spouse/significant other Available Help at Discharge: Family;Available 24 hours/day Type of Home: House Home Access: Stairs to enter Entrance Stairs-Rails: Doctor, general practice of Steps: 4 Home Layout: One level Home Equipment: Cane - single point      Prior Function Level of Independence: Independent         Comments: Ind amb community distances  without an AD, has access to 2LO2/min PRN but only used it recently associated with current admission, Ind with ADLs, no fall history     Hand Dominance   Dominant Hand: Right (Pt states he "throws with my left hand write and eat with my right hand.")    Extremity/Trunk Assessment   Upper Extremity  Assessment Upper Extremity Assessment: Overall WFL for tasks assessed    Lower Extremity Assessment Lower Extremity Assessment: Overall WFL for tasks assessed;LLE deficits/detail LLE Deficits / Details: decreased sensation from groin area to knee LLE Sensation: decreased light touch    Cervical / Trunk Assessment Cervical / Trunk Assessment: Normal  Communication   Communication: No difficulties  Cognition Arousal/Alertness: Awake/alert Behavior During Therapy: WFL for tasks assessed/performed Overall Cognitive Status: Within Functional Limits for tasks assessed                                        General Comments General comments (skin integrity, edema, etc.): BP 126/60, SpO2 >95% throughout on 4L O2, HR up to 90's with exertion. RR up to mid 30's with exertion, down to mid 20's at rest    Exercises Total Joint Exercises Ankle Circles/Pumps: AROM;Strengthening;Both;10 reps Quad Sets: Strengthening;Both;10 reps Gluteal Sets: Strengthening;Both;10 reps Hip ABduction/ADduction: Strengthening;Both;10 reps Straight Leg Raises: Strengthening;Both;5 reps Long Arc Quad: Strengthening;Both;10 reps Knee Flexion: Strengthening;Both;10 reps Other Exercises Other Exercises: HEP for BLE APs, QS, and GS x 10 each every 1-2 hours daily while in acute care   Assessment/Plan    PT Assessment Patient needs continued PT services  PT Problem List Decreased activity tolerance       PT Treatment Interventions DME instruction;Gait training;Stair training;Functional mobility training;Therapeutic activities;Therapeutic exercise;Balance training;Patient/family education    PT Goals (Current goals can be found in the Care Plan section)  Acute Rehab PT Goals Patient Stated Goal: To stop coughing and get my strength back PT Goal Formulation: With patient Time For Goal Achievement: 09/19/20 Potential to Achieve Goals: Good    Frequency Min 2X/week   Barriers to discharge         Co-evaluation               AM-PAC PT "6 Clicks" Mobility  Outcome Measure Help needed turning from your back to your side while in a flat bed without using bedrails?: None Help needed moving from lying on your back to sitting on the side of a flat bed without using bedrails?: None Help needed moving to and from a bed to a chair (including a wheelchair)?: None Help needed standing up from a chair using your arms (e.g., wheelchair or bedside chair)?: None Help needed to walk in hospital room?: A Little Help needed climbing 3-5 steps with a railing? : A Little 6 Click Score: 22    End of Session Equipment Utilized During Treatment: Gait belt;Oxygen Activity Tolerance: Patient tolerated treatment well Patient left: in bed;with call bell/phone within reach;with nursing/sitter in room Nurse Communication: Mobility status PT Visit Diagnosis: Difficulty in walking, not elsewhere classified (R26.2)    Time: 2248-2500 PT Time Calculation (min) (ACUTE ONLY): 21 min   Charges:   PT Evaluation $PT Eval Moderate Complexity: 1 Mod PT Treatments $Therapeutic Exercise: 8-22 mins        D. Elly Modena PT, DPT 09/06/20, 4:15 PM

## 2020-09-06 NOTE — ED Notes (Signed)
RT at beside placing patient on Bipap

## 2020-09-06 NOTE — ED Notes (Signed)
Nurse reviewed chart, Transportation requested

## 2020-09-06 NOTE — Evaluation (Signed)
Occupational Therapy Evaluation Patient Details Name: Dennis Preston MRN: [id number] DOB: 1951/09/29 Today's Date: 09/06/2020    History of Present Illness 69 y.o. 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 fib, anxiety, depression, and nicotine dependence who presents to the ER for evaluation of shortness of breath for about 6 day.   Clinical Impression   Pt seen for OT evaluation this date. Pt was independent in all ADL and using a SPC for stairs (since vascular surgery). Pt and spouse live in a 1 story home with 4 steps and bilateral wide rails. Pt/spouse endorse pt has O2 at home that he uses PRN only (2L). Pt reports recently becoming easily fatigued or out of breath with minimal exertion. Pt currently requires CGA for ADL transfers, LB ADL involving STS transfers due to current functional impairments (See OT Problem List below). Pt tolerated ADL mobility without AD with mild balance deficits noted and pt reporting moderate exertion. SpO2 >95% on 4L, HR in 90's and RR in mid 30's with exertion. Pt/spouse educated in energy conservation strategies including pursed lip breathing and activity pacing. Pt/spouse verbalized understanding and pt would benefit from additional skilled OT services while hospitalized to support cardiopulmonary function during ADL and minimize functional decline in addition to further instruction in energy conservation strategies to maximize safety/independence and return to PLOF.       Follow Up Recommendations  No OT follow up    Equipment Recommendations  None recommended by OT    Recommendations for Other Services       Precautions / Restrictions Precautions Precautions: Fall Restrictions Weight Bearing Restrictions: No      Mobility Bed Mobility Overal bed mobility:  Needs Assistance Bed Mobility: Supine to Sit;Sit to Supine     Supine to sit: Supervision Sit to supine: Supervision        Transfers Overall transfer level: Needs assistance Equipment used: None Transfers: Sit to/from Stand Sit to Stand: Min guard              Balance Overall balance assessment: Needs assistance Sitting-balance support: No upper extremity supported;Feet supported Sitting balance-Leahy Scale: Good     Standing balance support: No upper extremity supported Standing balance-Leahy Scale: Fair                             ADL either performed or assessed with clinical judgement   ADL Overall ADL's : Needs assistance/impaired                                       General ADL Comments: close to baseline, CGA for STS LB ADL tasks and ADL transfers     Vision Patient Visual Report: No change from baseline       Perception     Praxis      Pertinent Vitals/Pain Pain Assessment: No/denies pain     Hand Dominance Right (Pt states he "throws with my left hand write and eat with my right hand.")   Extremity/Trunk Assessment Upper Extremity Assessment Upper Extremity Assessment: Overall WFL for tasks assessed   Lower Extremity Assessment Lower Extremity Assessment: LLE deficits/detail;Defer to PT evaluation LLE Deficits / Details: decreased sensation from groin area to knee LLE  Sensation: decreased light touch   Cervical / Trunk Assessment Cervical / Trunk Assessment: Normal   Communication Communication Communication: No difficulties   Cognition Arousal/Alertness: Awake/alert Behavior During Therapy: WFL for tasks assessed/performed Overall Cognitive Status: Within Functional Limits for tasks assessed                                     General Comments  BP 126/60, SpO2 >95% throughout on 4L O2, HR up to 90's with exertion. RR up to mid 30's with exertion, down to mid 20's at rest    Exercises  Other Exercises Other Exercises: Pt/spouse isntructed in pursed lip breathing, activity pacing, and overview of energy conservation strategies, would benefit from additional instruction   Shoulder Instructions      Home Living Family/patient expects to be discharged to:: Private residence Living Arrangements: Spouse/significant other Available Help at Discharge: Family;Available 24 hours/day Type of Home: House Home Access: Stairs to enter [employer] of Steps: 4 in front Entrance Stairs-Rails: Right;Left Home Layout: One level         Firefighter: Standard Bathroom Accessibility: Yes How Accessible: Accessible via walker Home Equipment: Cane - single point          Prior Functioning/Environment Level of Independence: Independent with assistive device(s)        Comments: Pt reports SPC use for stairs (since recent femoral artery repair, otherwise no AD prior to that), and indep with ADL tasks, drives, retired        OT Problem List: Cardiopulmonary status limiting activity;Decreased activity tolerance;Impaired balance (sitting and/or standing);Decreased knowledge of use of DME or AE      OT Treatment/Interventions: Self-care/ADL training;Therapeutic exercise;Therapeutic activities;Energy conservation;DME and/or AE instruction;Patient/family education;Balance training    OT Goals(Current goals can be found in the care plan section) Acute Rehab OT Goals Patient Stated Goal: stop coughing and go home OT Goal Formulation: With patient/family Time For Goal Achievement: 09/20/20 Potential to Achieve Goals: Good ADL Goals Pt Will Perform Lower Body Dressing: with modified independence;sit to/from stand Pt Will Transfer to Toilet: with modified independence;ambulating;regular height toilet (LRAD for amb) Additional ADL Goal #1: Pt will verbalize plan to implement at least 1 learned energy conservation strategy at home.  OT Frequency: Min 1X/week   Barriers  to D/C:            Co-evaluation              AM-PAC OT "6 Clicks" Daily Activity     Outcome Measure Help from another person eating meals?: None Help from another person taking care of personal grooming?: None Help from another person toileting, which includes using toliet, bedpan, or urinal?: A Little Help from another person bathing (including washing, rinsing, drying)?: A Little Help from another person to put on and taking off regular upper body clothing?: None Help from another person to put on and taking off regular lower body clothing?: A Little 6 Click Score: 21   End of Session Equipment Utilized During Treatment: Gait belt;Oxygen Nurse Communication: Mobility status  Activity Tolerance: Patient tolerated treatment well Patient left: in bed;with call bell/phone within reach;with family/visitor present  OT Visit Diagnosis: Other abnormalities of gait and mobility (R26.89)                Time: 1610-9604 OT Time Calculation (min): 33 min Charges:  OT General Charges $OT Visit: 1 Visit OT Evaluation $OT Eval Low  Complexity: 1 Low OT Treatments $Therapeutic Activity: 8-22 mins  Richrd Prime, MPH, MS, OTR/L ascom [phone] 09/06/20, 2:16 PM

## 2020-09-06 NOTE — ED Notes (Signed)
Patient's oxygen saturation 83% on RA post albuterol treatment. Patient placed on 2L [place], oxygen saturation increased to 88%. MD notified, MD to bedside. Patient's oxygen increased to 3L [place]. Patient's oxygen saturation level increased to 90% on 3L. Patient's oxygen increased to 4L [place]. Patient's with diminished lung sounds. MD ordered Bipap. Respiratory called to place patient on Bipap.

## 2020-09-06 NOTE — ED Notes (Signed)
Patient with 3 run of PVCs. MD informed. MD ordered medications. See MAR.

## 2020-09-06 NOTE — TOC Initial Note (Signed)
Transition of Care [facility]) - Initial/Assessment Note    Patient Details  Name: Dennis Preston MRN: [id number] Date of Birth: 04-05-1951  Transition of Care [facility]) CM/SW Contact:    Marina Goodell Phone Number: [phone] 09/06/2020, 1:33 PM  Clinical Narrative:                  Patient presents to [facility] ED due to SOB. CSW spoke with patient and explained role of TOC in patient care.  Patient currently lives at home with Eustice,Betty (Spouse) [phone].  Patient uses 2L O2 when needed, patient could not remember the agency which supplies oxygen.  Patient stated he is able to perform ADLs and does not use anything to assist with ambulation but does have a cane if he needs it.  Patient stated he is able to drive but his wife typically drives him to doctor's appointments. Patient's [facility]. Patient stated he would prefer home health if that is the recommendation but may consider SNF.    Expected Discharge Plan: Skilled Nursing Facility Barriers to Discharge: Continued Medical Work up   Patient Goals and CMS Choice Patient states their goals for this hospitalization and ongoing recovery are:: "I would rather go back with home health." CMS Medicare.gov Compare Post Acute Care list provided to:: Patient    Expected Discharge Plan and Services Expected Discharge Plan: Skilled Nursing Facility In-house Referral: Clinical Social Work   Post Acute Care Choice: Home Health Living arrangements for the past 2 months: Single Family Home                                      Prior Living Arrangements/Services Living arrangements for the past 2 months: Single Family Home Lives with:: Spouse Patient language and need for interpreter reviewed:: Yes Do you feel safe going back to the place where you live?: Yes      Need for Family Participation in Patient Care: Yes (Comment) Care giver support system in place?: Yes (comment) Current home services: DME (2L  Oxygen) Criminal Activity/Legal Involvement Pertinent to Current Situation/Hospitalization: No - Comment as needed  Activities of Daily Living      Permission Sought/Granted Permission sought to share information with : Family Supports    Share Information with NAME: Anwar,Betty (Spouse)   [phone]           Emotional Assessment Appearance:: Appears older than stated age Attitude/Demeanor/Rapport: Engaged Affect (typically observed): Stable Orientation: : Oriented to Self,Oriented to Place,Oriented to  Time,Oriented to Situation Alcohol / Substance Use: Not Applicable Psych Involvement: No (comment)  Admission diagnosis:  COPD with acute exacerbation (HCC) [J44.1] Patient Active Problem List   Diagnosis Date Noted  . Acute respiratory failure (HCC) 09/06/2020  . CHF (congestive heart failure) (HCC)   . Hypokalemia   . 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 Atherosclerosis of artery of extremity with rest pain (HCC) 05/23/2020  . Chronic venous insufficiency 03/01/2020  . COPD exacerbation (HCC) 02/13/2020  . Aortic atherosclerosis (HCC) 08/08/2019  . COPD (chronic obstructive pulmonary disease) with chronic bronchitis (HCC) 08/08/2019  . COPD with acute exacerbation (HCC) 06/13/2018  . Tobacco use 01/14/2017  . A-fib (HCC) 07/18/2015  . Coagulopathy (HCC) 07/17/2015  . Hypotension 07/14/2015  . Leukocytosis 07/14/2015  . PAD (peripheral artery disease) (HCC) 07/14/2015  . S/P coronary artery bypass graft x 4 07/14/2015  . Carotid atherosclerosis, bilateral  07/12/2015  . Transient visual loss of right eye 07/11/2015  . NSTEMI (non-ST elevated myocardial infarction) (HCC) 07/10/2015  . Amaurosis fugax 07/10/2015  . Coronary artery disease 07/10/2015  . Hypoxia 07/09/2015  . ANXIETY 02/24/2007  . ERECTILE DYSFUNCTION 02/24/2007  . LIVER FUNCTION TESTS, ABNORMAL 02/24/2007   PCP:  Barbette Reichmann, MD Pharmacy:   [facility] -  [place], [place] - [address] [place] [address] Phone: [phone] Fax: [phone]     Social Determinants of Health (SDOH) Interventions    Readmission Risk Interventions No flowsheet data found.

## 2020-09-06 NOTE — ED Provider Notes (Signed)
[facility] Emergency Department Provider Note  ____________________________________________  Time seen: Approximately 3:35 AM  I have reviewed the triage vital signs and the nursing notes.   HISTORY  Chief Complaint Shortness of Breath   HPI Dennis Preston is a 69 y.o. male with a history of CHF with a EF of 50%, COPD, A. fib, smoking who presents for evaluation of shortness of breath.   Patient reports progressively worsening shortness of breath in the setting of cough productive of yellow phlegm for the last 6 days.  Called EMS this evening because he became more severe.  Satting 89% on room air for EMS on arrival.  Given 2 breathing treatments and 125 mg of Solu-Medrol.  Patient still complaining of shortness of breath.  He denies chest pain, fever, nausea vomiting, abdominal pain, personal or family history of blood clots, recent travel immobilization, leg pain or swelling, hemoptysis, exogenous hormones.  Patient denies any known sick exposures.  He reports being vaccinated for flu and Covid.  Past Medical History:  Diagnosis Date  . Alcohol abuse   . Amaurosis fugax   . Atrial fibrillation (HCC)   . Carotid stenosis   . CHF (congestive heart failure) (HCC)   . Coagulopathy (HCC)   . COPD (chronic obstructive pulmonary disease) (HCC)   . Coronary artery disease   . Depression   . Hyperlipemia   . Hypotension   . Leucocytosis   . Liver dysfunction   . Myocardial infarction (HCC)    non-STEMI. approx 2016  . Peripheral artery disease (HCC)    legs  . Tobacco abuse   . Wears dentures    full upper and lower    Patient Active Problem List   Diagnosis Date Noted  . 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 Atherosclerosis of artery of extremity with rest pain (HCC) 05/23/2020  . Chronic venous insufficiency 03/01/2020  . COPD exacerbation (HCC) 02/13/2020  . Aortic atherosclerosis (HCC) 08/08/2019  . COPD (chronic obstructive  pulmonary disease) with chronic bronchitis (HCC) 08/08/2019  . COPD with acute exacerbation (HCC) 06/13/2018  . Tobacco use 01/14/2017  . A-fib (HCC) 07/18/2015  . Coagulopathy (HCC) 07/17/2015  . Hypotension 07/14/2015  . Leukocytosis 07/14/2015  . PAD (peripheral artery disease) (HCC) 07/14/2015  . S/P coronary artery bypass graft x 4 07/14/2015  . Carotid atherosclerosis, bilateral 07/12/2015  . Transient visual loss of right eye 07/11/2015  . NSTEMI (non-ST elevated myocardial infarction) (HCC) 07/10/2015  . Amaurosis fugax 07/10/2015  . Coronary artery disease 07/10/2015  . Hypoxia 07/09/2015  . ANXIETY 02/24/2007  . ERECTILE DYSFUNCTION 02/24/2007  . LIVER FUNCTION TESTS, ABNORMAL 02/24/2007    Past Surgical History:  Procedure Laterality Date  . APPLICATION OF WOUND VAC Right 06/22/2020   Procedure: APPLICATION OF WOUND VAC;  Surgeon: Annice Needy, MD;  Location: [facility] ORS;  Service: Vascular;  Laterality: Right;  . CARDIAC CATHETERIZATION N/A 07/10/2015   Procedure: Right/Left Heart Cath and Coronary Angiography;  Surgeon: Alwyn Pea, MD;  Location: [facility] INVASIVE CV LAB;  Service: Cardiovascular;  Laterality: N/A;  . CORONARY ARTERY BYPASS GRAFT  07/13/2015   [facility].  4 vessel  . ENDARTERECTOMY FEMORAL Bilateral 05/23/2020   Procedure: ENDARTERECTOMY FEMORAL BILATERAL SUPERFICIAL FEMORAL ARTERY STENTS ;  Surgeon: Renford Dills, MD;  Location: [facility] ORS;  Service: Vascular;  Laterality: Bilateral;  . FALSE ANEURYSM REPAIR Left 07/22/2020   Procedure: REPAIR LEFT FEMORAL PSEUDOANUERYSM;  Surgeon: Bertram Denver, MD;  Location: [facility]  ORS;  Service: Vascular;  Laterality: Left;  . INSERTION OF ILIAC STENT Left 05/23/2020   Procedure: INSERTION OF ILIAC STENT;  Surgeon: Renford Dills, MD;  Location: [facility] ORS;  Service: Vascular;  Laterality: Left;  . LEFT HEART CATH AND CORS/GRAFTS ANGIOGRAPHY N/A 01/23/2020   Procedure: LEFT HEART CATH AND CORS/GRAFTS ANGIOGRAPHY;   Surgeon: Alwyn Pea, MD;  Location: [facility] INVASIVE CV LAB;  Service: Cardiovascular;  Laterality: N/A;  . LOWER EXTREMITY ANGIOGRAPHY Right 04/03/2020   Procedure: LOWER EXTREMITY ANGIOGRAPHY;  Surgeon: Renford Dills, MD;  Location: [facility] INVASIVE CV LAB;  Service: Cardiovascular;  Laterality: Right;  . OLECRANON BURSECTOMY Left 05/06/2019   Procedure: OLECRANON BURSECTOMY AND DEBRIDEMENT;  Surgeon: Signa Kell, MD;  Location: [facility] ORS;  Service: Orthopedics;  Laterality: Left;  . WOUND DEBRIDEMENT Right 06/22/2020   Procedure: DEBRIDEMENT WOUND RIGHT GROIN;  Surgeon: Annice Needy, MD;  Location: [facility] ORS;  Service: Vascular;  Laterality: Right;    Prior to Admission medications   Medication Sig Start Date End Date Taking? Authorizing Provider  acetaminophen (TYLENOL) 325 MG tablet Take 650 mg by mouth every 6 (six) hours as needed for mild pain or headache.     [provider]  albuterol (PROVENTIL) (2.5 MG/3ML) 0.083% nebulizer solution Take 3 mLs (2.5 mg total) by nebulization every 6 (six) hours as needed for wheezing or shortness of breath. 02/15/20   Lurene Shadow, MD  albuterol (VENTOLIN HFA) 108 (90 Base) MCG/ACT inhaler Inhale 1-2 puffs into the lungs every 6 (six) hours as needed for wheezing or shortness of breath. 02/15/20   Lurene Shadow, MD  aspirin EC 81 MG tablet Take 1 tablet (81 mg total) by mouth daily. Swallow whole. 04/03/20 04/03/21  Schnier, Latina Craver, MD  atorvastatin (LIPITOR) 80 MG tablet Take 1 tablet (80 mg total) by mouth daily at 6 PM. 07/10/15   Houston Siren, MD  clopidogrel (PLAVIX) 75 MG tablet Take 75 mg by mouth every evening.  04/18/16   [provider]  docusate sodium (COLACE) 100 MG capsule Take 1 capsule (100 mg total) by mouth 2 (two) times daily. 05/28/20   Stegmayer, Ranae Plumber, PA-C  Fluticasone-Umeclidin-Vilant (TRELEGY ELLIPTA) 100-62.5-25 MCG/INH AEPB Inhale 1 puff into the lungs every other day.  01/27/20   [provider]  furosemide (LASIX) 40 MG tablet Take 40 mg by mouth daily.  12/26/19   [provider]  gabapentin (NEURONTIN) 300 MG capsule Take 1 capsule (300 mg total) by mouth at bedtime. 08/30/20   Georgiana Spinner, NP  oxyCODONE-acetaminophen (PERCOCET/ROXICET) 5-325 MG tablet Take 1 tablet by mouth every 6 (six) hours as needed for moderate pain or severe pain. 07/26/20   Stegmayer, Cala Bradford A, PA-C  senna-docusate (SENOKOT-S) 8.6-50 MG tablet Take 1 tablet by mouth at bedtime as needed for mild constipation. 05/28/20   Stegmayer, Cala Bradford A, PA-C    Allergies Chlorhexidine  Family History  Problem Relation Age of Onset  . Depression Sister     Social History Social History   Tobacco Use  . Smoking status: Former Smoker    Packs/day: 1.00    Years: 54.00    Pack years: 54.00    Types: Cigarettes    Quit date: 03/16/2020    Years since quitting: 0.4  . Smokeless tobacco: Never Used  . Tobacco comment: was 2.5 PPD for 50 yrs until 2016, quit a "few days ago"  Vaping Use  . Vaping Use: Never used  Substance Use Topics  .  Alcohol use: Not Currently    Comment: quit over 15 yrs ago  . Drug use: Never    Review of Systems  Constitutional: Negative for fever. Eyes: Negative for visual changes. ENT: Negative for sore throat. Neck: No neck pain  Cardiovascular: Negative for chest pain. Respiratory: + shortness of breath, cough, wheezing Gastrointestinal: Negative for abdominal pain, vomiting or diarrhea. Genitourinary: Negative for dysuria. Musculoskeletal: Negative for back pain. Skin: Negative for rash. Neurological: Negative for headaches, weakness or numbness. Psych: No SI or HI  ____________________________________________   PHYSICAL EXAM:  VITAL SIGNS: ED Triage Vitals [09/06/20 0221]  Enc Vitals Group     BP (!) 162/88     Pulse Rate (!) 106     Resp (!) 22     Temp 98.4 F (36.9 C)     Temp Source Oral     SpO2 (!) 89 %     Weight 158 lb 1.1 oz (71.7  kg)     Height 5\' 5"  (1.651 m)     Head Circumference      Peak Flow      Pain Score 0     Pain Loc      Pain Edu?      Excl. in GC?     Constitutional: Alert and oriented, moderate respiratory distress.  HEENT:      Head: Normocephalic and atraumatic.         Eyes: Conjunctivae are normal. Sclera is non-icteric.       Mouth/Throat: Mucous membranes are moist.       Neck: Supple with no signs of meningismus. Cardiovascular: Tachycardic with regular rhythm Respiratory: Increased work of breathing, hypoxic to 88% on room air, very tight on auscultation with faint expiratory wheezes throughout Gastrointestinal: Soft, non tender, and non distended. Musculoskeletal: No edema, cyanosis, or erythema of extremities. Neurologic: Normal speech and language. Face is symmetric. Moving all extremities. No gross focal neurologic deficits are appreciated. Skin: Skin is warm, dry and intact. No rash noted. Psychiatric: Mood and affect are normal. Speech and behavior are normal.  ____________________________________________   LABS (all labs ordered are listed, but only abnormal results are displayed)  Labs Reviewed  CBC - Abnormal; Notable for the following components:      Result Value   RDW 15.6 (*)    All other components within normal limits  BASIC METABOLIC PANEL - Abnormal; Notable for the following components:   Potassium 3.4 (*)    Glucose, Bld 139 (*)    Calcium 8.7 (*)    All other components within normal limits  BLOOD GAS, VENOUS - Abnormal; Notable for the following components:   pCO2, Ven 63 (*)    pO2, Ven <31.0 (*)    Bicarbonate 34.0 (*)    Acid-Base Excess 6.0 (*)    All other components within normal limits  TROPONIN I (HIGH SENSITIVITY) - Abnormal; Notable for the following components:   Troponin I (High Sensitivity) 19 (*)    All other components within normal limits  RESP PANEL BY RT-PCR (FLU A&B, COVID) ARPGX2  BRAIN NATRIURETIC PEPTIDE  CBC WITH  DIFFERENTIAL/PLATELET  TROPONIN I (HIGH SENSITIVITY)   ____________________________________________  EKG  ED ECG REPORT I, , the attending physician, personally viewed and interpreted this ECG.  Normal sinus rhythm, rate of 99, borderline prolonged QTC, right axis deviation, no ST elevations ____________________________________________  RADIOLOGY  I have personally reviewed the images performed during this visit and I agree with the Radiologist's read.  Interpretation by Radiologist:  DG Chest Portable 1 View  Result Date: 09/06/2020 CLINICAL DATA:  Shortness of breath EXAM: PORTABLE CHEST 1 VIEW COMPARISON:  Feb 13, 2020 FINDINGS: The heart size and mediastinal contours are within normal limits. Aortic knob calcifications are seen. Overlying median sternotomy wires are present. There is prominence of the central pulmonary vasculature. The visualized skeletal structures are unremarkable. IMPRESSION: Pulmonary vascular congestion. Electronically Signed   By: Jonna Clark M.D.   On: 09/06/2020 02:55     ____________________________________________   PROCEDURES  Procedure(s) performed:yes .1-3 Lead EKG Interpretation Performed by: Nita Sickle, MD Authorized by: Nita Sickle, MD     Interpretation: abnormal     ECG rate assessment: normal     Rhythm: sinus rhythm     Ectopy: none     Critical Care performed: yes  CRITICAL CARE Performed by: Nita Sickle  ?  Total critical care time: 40 min  Critical care time was exclusive of separately billable procedures and treating other patients.  Critical care was necessary to treat or prevent imminent or life-threatening deterioration.  Critical care was time spent personally by me on the following activities: development of treatment plan with patient and/or surrogate as well as nursing, discussions with consultants, evaluation of patient's response to treatment, examination of patient,  obtaining history from patient or surrogate, ordering and performing treatments and interventions, ordering and review of laboratory studies, ordering and review of radiographic studies, pulse oximetry and re-evaluation of patient's condition.  ____________________________________________   INITIAL IMPRESSION / ASSESSMENT AND PLAN / ED COURSE   69 y.o. male with a history of CHF with a EF of 50%, COPD, A. fib, smoking who presents for evaluation of shortness of breath and wheezing which has been progressively worse over the last 6 days due to a cough productive of yellow phlegm.  No fever or chills.  Patient is vaccinated for Covid and flu.  Patient arrives in moderate respiratory distress with increased work of breathing, hypoxic to 88% on room air, very tight exam on auscultation with diffuse wheezing bilaterally.  No pitting edema or asymmetric leg swelling.  EKG with no signs of acute ischemic changes.  Chest x-ray visualized by me consistent edema, confirmed by radiology.  Ddx COPD vs CHF vs Covid vs Flu vs PE vs PNA  Patient given 3 duo nebs.  Had already received 125 of Solu-Medrol.  After receiving breathing treatments patient's hypoxia worsened with sats in the mid 80s.  Patient continues to have unchanged increased work of breathing.  Will start patient on BiPAP.  Labs showing no white count, no anemia, no significant electrolyte derangements, BNP of 73 with a troponin of 19.  VBG with a pH of 7.34 and PCO2 of 63.  Covid and flu pending.  Anticipate admission to the hospitalist service.  Old medical records reviewed including most recent echocardiogram from April 2021 showing EF of 50%.  Patient is on Lasix at home 40 mg daily.  Will give a dose of IV Lasix.  _________________________ 4:27 AM on 09/06/2020 -----------------------------------------  Patient improved on BiPAP.  Covid and flu negative.  Will admit to the hospitalist service.  Diagnosis of COPD and CHF exacerbation     _____________________________________________ Please note:  Patient was evaluated in Emergency Department today for the symptoms described in the history of present illness. Patient was evaluated in the context of the global COVID-19 pandemic, which necessitated consideration that the patient might be at risk for infection with the SARS-CoV-2 virus that  causes COVID-19. Institutional protocols and algorithms that pertain to the evaluation of patients at risk for COVID-19 are in a state of rapid change based on information released by regulatory bodies including the CDC and federal and state organizations. These policies and algorithms were followed during the patient's care in the ED.  Some ED evaluations and interventions may be delayed as a result of limited staffing during the pandemic.   [place] Controlled Substance Database was reviewed by me. ____________________________________________   FINAL CLINICAL IMPRESSION(S) / ED DIAGNOSES   Final diagnoses:  Acute respiratory failure with hypoxia (HCC)  Acute on chronic congestive heart failure, unspecified heart failure type (HCC)  COPD exacerbation (HCC)      NEW MEDICATIONS STARTED DURING THIS VISIT:  ED Discharge Orders    None       Note:  This document was prepared using Dragon voice recognition software and may include unintentional dictation errors.    Don PerkingVeronese, [place], MD 09/06/20 [phone]

## 2020-09-06 NOTE — H&P (Signed)
History and Physical    Dennis Preston OZD:[id number] DOB: 1950/09/30 DOA: 09/06/2020  PCP: Barbette Reichmann, MD   Patient coming from: Home  I have personally briefly reviewed patient's old medical records in [facility]  Chief Complaint: Shortness of breath  HPI: Dennis Preston is a 69 y.o. male with medical history significant for chronic diastolic dysfunction CHF with last known LVEF of 50%, COPD, A. fib and nicotine dependence who presents to the ER for evaluation of shortness of breath for about 6 days.  Patient states that his symptoms have progressively worsened over the last couple of days and now he is short of breath even at rest.  Shortness of breath is associated with a cough productive of yellow phlegm.  He called EMS and when they arrived he was found to have room air pulse oximetry of 89%.  EMS administered 2 DuoNeb's and Solu-Medrol 125 mg IV. He denies having any sick contacts and is vaccinated against COVID-19 as well as influenza. He denies having any chest pain, no fever, no chills, no nausea, no vomiting, no abdominal pain or any changes in his bowel habits, no dizziness or lightheadedness. Venous blood gas 7.34/63/31/34/33 Sodium 142, potassium 3.4, chloride 101, bicarb 30, glucose 139, BUN 11, creatinine 1.01, calcium 8.7, BNP 73, troponin 19, white count 7.2, hemoglobin 13.8, hematocrit 43.7, MCV 90.7, RDW 15.6, platelet count 262 Respiratory viral panel is negative Chest x-ray reviewed by me shows pulmonary vascular congestion Twelve-lead EKG reviewed by me shows sinus rhythm, borderline ST depression in the lateral leads, LVH   ED Course: Patient is a 69 year old Caucasian male with a history of COPD, A. fib, nicotine dependence who presents to the ER for evaluation of shortness of breath associated with a productive cough and wheezing over the last 6 days.  Upon arrival to the ER he was noted to be hypoxic with increased work of breathing.  His blood  pressure was elevated with systolic blood pressure in the 160s and he was tachycardic as well.  Room air pulse oximetry was in the mid 80s.  Patient was placed on BiPAP to reduce work of breathing.  He will be admitted to the hospital for further evaluation.  Review of Systems: As per HPI otherwise 10 point review of systems negative.    Past Medical History:  Diagnosis Date  . Alcohol abuse   . Amaurosis fugax   . Atrial fibrillation (HCC)   . Carotid stenosis   . CHF (congestive heart failure) (HCC)   . Coagulopathy (HCC)   . COPD (chronic obstructive pulmonary disease) (HCC)   . Coronary artery disease   . Depression   . Hyperlipemia   . Hypotension   . Leucocytosis   . Liver dysfunction   . Myocardial infarction (HCC)    non-STEMI. approx 2016  . Peripheral artery disease (HCC)    legs  . Tobacco abuse   . Wears dentures    full upper and lower    Past Surgical History:  Procedure Laterality Date  . APPLICATION OF WOUND VAC Right 06/22/2020   Procedure: APPLICATION OF WOUND VAC;  Surgeon: Annice Needy, MD;  Location: [facility] ORS;  Service: Vascular;  Laterality: Right;  . CARDIAC CATHETERIZATION N/A 07/10/2015   Procedure: Right/Left Heart Cath and Coronary Angiography;  Surgeon: Alwyn Pea, MD;  Location: [facility] INVASIVE CV LAB;  Service: Cardiovascular;  Laterality: N/A;  . CORONARY ARTERY BYPASS GRAFT  07/13/2015   [facility].  4 vessel  .  ENDARTERECTOMY FEMORAL Bilateral 05/23/2020   Procedure: ENDARTERECTOMY FEMORAL BILATERAL SUPERFICIAL FEMORAL ARTERY STENTS ;  Surgeon: Renford Dills, MD;  Location: [facility] ORS;  Service: Vascular;  Laterality: Bilateral;  . FALSE ANEURYSM REPAIR Left 07/22/2020   Procedure: REPAIR LEFT FEMORAL PSEUDOANUERYSM;  Surgeon: Bertram Denver, MD;  Location: [facility] ORS;  Service: Vascular;  Laterality: Left;  . INSERTION OF ILIAC STENT Left 05/23/2020   Procedure: INSERTION OF ILIAC STENT;  Surgeon: Renford Dills, MD;  Location: [facility] ORS;   Service: Vascular;  Laterality: Left;  . LEFT HEART CATH AND CORS/GRAFTS ANGIOGRAPHY N/A 01/23/2020   Procedure: LEFT HEART CATH AND CORS/GRAFTS ANGIOGRAPHY;  Surgeon: Alwyn Pea, MD;  Location: [facility] INVASIVE CV LAB;  Service: Cardiovascular;  Laterality: N/A;  . LOWER EXTREMITY ANGIOGRAPHY Right 04/03/2020   Procedure: LOWER EXTREMITY ANGIOGRAPHY;  Surgeon: Renford Dills, MD;  Location: [facility] INVASIVE CV LAB;  Service: Cardiovascular;  Laterality: Right;  . OLECRANON BURSECTOMY Left 05/06/2019   Procedure: OLECRANON BURSECTOMY AND DEBRIDEMENT;  Surgeon: Signa Kell, MD;  Location: [facility] ORS;  Service: Orthopedics;  Laterality: Left;  . WOUND DEBRIDEMENT Right 06/22/2020   Procedure: DEBRIDEMENT WOUND RIGHT GROIN;  Surgeon: Annice Needy, MD;  Location: [facility] ORS;  Service: Vascular;  Laterality: Right;     reports that he quit smoking about 5 months ago. His smoking use included cigarettes. He has a 54.00 pack-year smoking history. He has never used smokeless tobacco. He reports previous alcohol use. He reports that he does not use drugs.  Allergies  Allergen Reactions  . Chlorhexidine     Family History  Problem Relation Age of Onset  . Depression Sister      Prior to Admission medications   Medication Sig Start Date End Date Taking? Authorizing Provider  acetaminophen (TYLENOL) 325 MG tablet Take 650 mg by mouth every 6 (six) hours as needed for mild pain or headache.    Yes [provider]  albuterol (PROVENTIL) (2.5 MG/3ML) 0.083% nebulizer solution Take 3 mLs (2.5 mg total) by nebulization every 6 (six) hours as needed for wheezing or shortness of breath. 02/15/20  Yes Lurene Shadow, MD  albuterol (VENTOLIN HFA) 108 (90 Base) MCG/ACT inhaler Inhale 1-2 puffs into the lungs every 6 (six) hours as needed for wheezing or shortness of breath. 02/15/20  Yes Lurene Shadow, MD  aspirin EC 81 MG tablet Take 1 tablet (81 mg total) by mouth daily. Swallow whole. 04/03/20 04/03/21  Yes Schnier, Latina Craver, MD  atorvastatin (LIPITOR) 80 MG tablet Take 1 tablet (80 mg total) by mouth daily at 6 PM. 07/10/15  Yes Sainani, Rolly Pancake, MD  clopidogrel (PLAVIX) 75 MG tablet Take 75 mg by mouth every evening.  04/18/16  Yes [provider]  Fluticasone-Umeclidin-Vilant (TRELEGY ELLIPTA) 100-62.5-25 MCG/INH AEPB Inhale 1 puff into the lungs every other day. 01/27/20  Yes [provider]  furosemide (LASIX) 40 MG tablet Take 40 mg by mouth daily.  12/26/19  Yes [provider]  gabapentin (NEURONTIN) 300 MG capsule Take 1 capsule (300 mg total) by mouth at bedtime. 08/30/20  Yes Georgiana Spinner, NP  benzonatate (TESSALON) 200 MG capsule Take 200 mg by mouth 3 (three) times daily as needed. Patient not taking: Reported on 09/06/2020 09/05/20   [provider]  docusate sodium (COLACE) 100 MG capsule Take 1 capsule (100 mg total) by mouth 2 (two) times daily. Patient not taking: No sig reported 05/28/20   Stegmayer, Ranae Plumber, PA-C  oxyCODONE-acetaminophen (PERCOCET/ROXICET)  5-325 MG tablet Take 1 tablet by mouth every 6 (six) hours as needed for moderate pain or severe pain. Patient not taking: No sig reported 07/26/20   Stegmayer, Cala Bradford A, PA-C  senna-docusate (SENOKOT-S) 8.6-50 MG tablet Take 1 tablet by mouth at bedtime as needed for mild constipation. Patient not taking: No sig reported 05/28/20   Tonette Lederer, PA-C    Physical Exam: Vitals:   09/06/20 0700 09/06/20 0730 09/06/20 0742 09/06/20 0743  BP: (!) 128/55 (!) 123/59    Pulse: 83 71    Resp: (!) 33 (!) 23    Temp:      TempSrc:      SpO2: 98% 98% 97% 97%  Weight:      Height:         Vitals:   09/06/20 0700 09/06/20 0730 09/06/20 0742 09/06/20 0743  BP: (!) 128/55 (!) 123/59    Pulse: 83 71    Resp: (!) 33 (!) 23    Temp:      TempSrc:      SpO2: 98% 98% 97% 97%  Weight:      Height:        Constitutional: NAD, alert and oriented x 3.  Patient is on BiPAP and  appears comfortable Eyes: PERRL, lids and conjunctivae normal ENMT: Mucous membranes are moist.  Neck: normal, supple, no masses, no thyromegaly Respiratory: Bilateral air entry, faint wheezing,  crackles at the bases. Normal respiratory effort. No accessory muscle use.  Cardiovascular: Regular rate and rhythm, no murmurs / rubs / gallops. No extremity edema. 2+ pedal pulses. No carotid bruits.  Abdomen: no tenderness, no masses palpated. No hepatosplenomegaly. Bowel sounds positive.  Musculoskeletal: no clubbing / cyanosis. No joint deformity upper and lower extremities.  Skin: no rashes, lesions, ulcers.  Neurologic: No gross focal neurologic deficit. Psychiatric: Normal mood and affect.   Labs on Admission: I have personally reviewed following labs and imaging studies  CBC: Recent Labs  Lab 09/06/20 0215  WBC 7.2  HGB 13.8  HCT 43.7  MCV 90.7  PLT 262   Basic Metabolic Panel: Recent Labs  Lab 09/06/20 0215  NA 142  K 3.4*  CL 101  CO2 30  GLUCOSE 139*  BUN 11  CREATININE 1.01  CALCIUM 8.7*   GFR: Estimated Creatinine Clearance: 60 mL/min (by C-G formula based on SCr of 1.01 mg/dL). Liver Function Tests: No results for input(s): AST, ALT, ALKPHOS, BILITOT, PROT, ALBUMIN in the last 168 hours. No results for input(s): LIPASE, AMYLASE in the last 168 hours. No results for input(s): AMMONIA in the last 168 hours. Coagulation Profile: No results for input(s): INR, PROTIME in the last 168 hours. Cardiac Enzymes: No results for input(s): CKTOTAL, CKMB, CKMBINDEX, TROPONINI in the last 168 hours. BNP (last 3 results) No results for input(s): PROBNP in the last 8760 hours. HbA1C: No results for input(s): HGBA1C in the last 72 hours. CBG: No results for input(s): GLUCAP in the last 168 hours. Lipid Profile: No results for input(s): CHOL, HDL, LDLCALC, TRIG, CHOLHDL, LDLDIRECT in the last 72 hours. Thyroid Function Tests: No results for input(s): TSH, T4TOTAL, FREET4,  T3FREE, THYROIDAB in the last 72 hours. Anemia Panel: No results for input(s): VITAMINB12, FOLATE, FERRITIN, TIBC, IRON, RETICCTPCT in the last 72 hours. Urine analysis:    Component Value Date/Time   COLORURINE YELLOW 05/10/2008 1654   APPEARANCEUR CLEAR 05/10/2008 1654   LABSPEC 1.021 05/10/2008 1654   PHURINE 5.5 05/10/2008 1654   GLUCOSEU NEGATIVE 05/10/2008  1654   HGBUR NEGATIVE 05/10/2008 1654   BILIRUBINUR NEGATIVE 05/10/2008 1654   KETONESUR NEGATIVE 05/10/2008 1654   PROTEINUR NEGATIVE 05/10/2008 1654   UROBILINOGEN 0.2 05/10/2008 1654   NITRITE NEGATIVE 05/10/2008 1654   LEUKOCYTESUR  05/10/2008 1654    NEGATIVE MICROSCOPIC NOT DONE ON URINES WITH NEGATIVE PROTEIN, BLOOD, LEUKOCYTES, NITRITE, OR GLUCOSE <1000 mg/dL.    Radiological Exams on Admission: DG Chest Portable 1 View  Result Date: 09/06/2020 CLINICAL DATA:  Shortness of breath EXAM: PORTABLE CHEST 1 VIEW COMPARISON:  Feb 13, 2020 FINDINGS: The heart size and mediastinal contours are within normal limits. Aortic knob calcifications are seen. Overlying median sternotomy wires are present. There is prominence of the central pulmonary vasculature. The visualized skeletal structures are unremarkable. IMPRESSION: Pulmonary vascular congestion. Electronically Signed   By: Jonna ClarkBindu  Avutu M.D.   On: 09/06/2020 02:55    EKG: Independently reviewed.  Sinus rhythm, borderline ST depression in the lateral leads, RVH  Assessment/Plan Principal Problem:   Acute respiratory failure (HCC) Active Problems:   COPD exacerbation (HCC)   A-fib (HCC)   Coronary artery disease   Tobacco use   CHF (congestive heart failure) (HCC)   Hypokalemia     Acute respiratory failure Multifactorial and secondary to acute diastolic dysfunction CHF as well as acute COPD exacerbation Patient presented to the ER for evaluation of shortness of breath and was noted to be in respiratory distress with increased work of breathing, he was tachypneic  and hypoxic with room air pulse oximetry in the mid 80s. He is currently on a BiPAP with reduced work of breathing and improved oxygenation We will attempt to wean patient off BiPAP as tolerated   Acute diastolic dysfunction CHF Patient had a 2D echocardiogram which was done in April 2021 and shows a normal LV systolic function, LVEF of 50 to 55% with mild diastolic dysfunction We will place patient on Lasix 40 mg IV daily Optimize blood pressure control Patient with lisinopril 10 mg daily as well as Toprol 25 mg daily    Acute COPD exacerbation Patient has a known history of COPD and continues to smoke He presents with worsening shortness of breath associated with a cough productive of yellow phlegm and wheezing We will place patient on Rocephin 1 g IV daily Place patient on scheduled and as needed bronchodilator therapy Place patient on systemic steroids    Nicotine dependence Smoking cessation was discussed with patient He declines a nicotine transdermal patch at this time    Hypokalemia Supplement potassium  Obtain magnesium levels   History of coronary artery disease Continue aspirin, Plavix, statins and beta-blockers    Paroxysmal A. Fib Currently in sinus rhythm Continue metoprolol Patient not on long-term anticoagulation therapy    DVT prophylaxis: Lovenox for DVT prophylaxis Code Status: Full code Family Communication: Greater than 50% of time was spent discussing plan of care with patient at the bedside.  All questions and concerns have been addressed.  He verbalizes understanding and agrees with the plan. Disposition Plan: Back to previous home environment Consults called: None    Genese Quebedeaux MD [facility]     09/06/2020, 8:41 AM

## 2020-09-06 NOTE — ED Notes (Signed)
Pt taken off bipap while giving oral meds and placed on 4L [place]. Pt is maintaining O2 sats 95-97%. Pt denies having any issues at present. Pt is talking in compete sentences.

## 2020-09-06 NOTE — Plan of Care (Signed)

## 2020-09-06 NOTE — ED Triage Notes (Signed)
Patient coming [facility] from home for SOB x 6 days. Patient 89% on RA at EMS arrival. Patient has O2 at home to use PRN. Patient given 125 mg solumedrol, 1 albuterol neb and 1 duoneb in transport.

## 2020-09-06 NOTE — ED Notes (Addendum)
Message sent to [facility] if pt was ready to come to floor

## 2020-09-07 ENCOUNTER — Encounter: Payer: Self-pay | Admitting: *Deleted

## 2020-09-07 LAB — BASIC METABOLIC PANEL
Anion gap: 11 (ref 5–15)
BUN: 19 mg/dL (ref 8–23)
CO2: 30 mmol/L (ref 22–32)
Calcium: 9 mg/dL (ref 8.9–10.3)
Chloride: 100 mmol/L (ref 98–111)
Creatinine, Ser: 0.71 mg/dL (ref 0.61–1.24)
GFR, Estimated: >60 mL/min (ref >60)
Glucose, Bld: 168 mg/dL — ABNORMAL HIGH (ref 70–99)
Potassium: 4.6 mmol/L (ref 3.5–5.1)
Sodium: 141 mmol/L (ref 135–145)

## 2020-09-07 LAB — CBC
HCT: 40.9 % (ref 39.0–52.0)
Hemoglobin: 13.2 g/dL (ref 13.0–17.0)
MCH: 29.1 pg (ref 26.0–34.0)
MCHC: 32.3 g/dL (ref 30.0–36.0)
MCV: 90.1 fL (ref 80.0–100.0)
Platelets: 314 10*3/uL (ref 150–400)
RBC: 4.54 MIL/uL (ref 4.22–5.81)
RDW: 15.8 % — ABNORMAL HIGH (ref 11.5–15.5)
WBC: 10.7 10*3/uL — ABNORMAL HIGH (ref 4.0–10.5)
nRBC: 0 % (ref 0.0–0.2)

## 2020-09-07 LAB — HIV ANTIBODY (ROUTINE TESTING W REFLEX): HIV Screen 4th Generation wRfx: NONREACTIVE

## 2020-09-07 MED ORDER — ALUM & MAG HYDROXIDE-SIMETH 200-200-20 MG/5ML PO SUSP
30.0000 mL | ORAL | Status: DC | PRN
  Administered 2020-09-07: 30 mL via ORAL
  Filled 2020-09-07: qty 30

## 2020-09-07 MED ORDER — ADULT MULTIVITAMIN W/MINERALS CH
1.0000 | ORAL_TABLET | Freq: Every day | ORAL | Status: DC
  Administered 2020-09-07 – 2020-09-08 (×2): 1 via ORAL
  Filled 2020-09-07 (×2): qty 1

## 2020-09-07 MED ORDER — LEVOFLOXACIN 500 MG PO TABS
500.0000 mg | ORAL_TABLET | Freq: Every day | ORAL | Status: DC
  Administered 2020-09-08: 08:00:00 500 mg via ORAL
  Filled 2020-09-07: qty 1

## 2020-09-07 MED ORDER — FUROSEMIDE 40 MG PO TABS
40.0000 mg | ORAL_TABLET | Freq: Every day | ORAL | Status: DC
  Administered 2020-09-08: 08:00:00 40 mg via ORAL
  Filled 2020-09-07: qty 1

## 2020-09-07 MED ORDER — ONDANSETRON HCL 4 MG/2ML IJ SOLN: 4.0000 mg | Freq: Once | INTRAMUSCULAR | Status: DC

## 2020-09-07 MED ORDER — IPRATROPIUM-ALBUTEROL 0.5-2.5 (3) MG/3ML IN SOLN
3.0000 mL | Freq: Four times a day (QID) | RESPIRATORY_TRACT | Status: DC
  Filled 2020-09-07: qty 3

## 2020-09-07 MED ORDER — ENSURE ENLIVE PO LIQD
237.0000 mL | Freq: Two times a day (BID) | ORAL | Status: DC
  Administered 2020-09-07 – 2020-09-08 (×2): 237 mL via ORAL

## 2020-09-07 MED ORDER — ONDANSETRON HCL 4 MG/2ML IJ SOLN: 4.0000 mg | Freq: Four times a day (QID) | INTRAMUSCULAR | Status: DC | PRN

## 2020-09-07 NOTE — Plan of Care (Signed)
  Problem: Education: Goal: Knowledge of disease or condition will improve 09/07/2020 1305 by Ansel Bong, RN Outcome: Progressing 09/07/2020 1305 by Ansel Bong, RN Outcome: Progressing Goal: Knowledge of the prescribed therapeutic regimen will improve 09/07/2020 1305 by Ansel Bong, RN Outcome: Progressing 09/07/2020 1305 by Ansel Bong, RN Outcome: Progressing Goal: Individualized Educational Video(s) 09/07/2020 1305 by Ansel Bong, RN Outcome: Progressing 09/07/2020 1305 by Ansel Bong, RN Outcome: Progressing   Problem: Activity: Goal: Ability to tolerate increased activity will improve 09/07/2020 1305 by Ansel Bong, RN Outcome: Progressing 09/07/2020 1305 by Ansel Bong, RN Outcome: Progressing Goal: Will verbalize the importance of balancing activity with adequate rest periods 09/07/2020 1305 by Ansel Bong, RN Outcome: Progressing 09/07/2020 1305 by Ansel Bong, RN Outcome: Progressing   Problem: Respiratory: Goal: Ability to maintain a clear airway will improve 09/07/2020 1305 by Ansel Bong, RN Outcome: Progressing 09/07/2020 1305 by Ansel Bong, RN Outcome: Progressing Goal: Levels of oxygenation will improve 09/07/2020 1305 by Ansel Bong, RN Outcome: Progressing 09/07/2020 1305 by Ansel Bong, RN Outcome: Progressing Goal: Ability to maintain adequate ventilation will improve 09/07/2020 1305 by Ansel Bong, RN Outcome: Progressing 09/07/2020 1305 by Ansel Bong, RN Outcome: Progressing

## 2020-09-07 NOTE — Progress Notes (Signed)
Initial Nutrition Assessment  DOCUMENTATION CODES:   Not applicable  INTERVENTION:  Ensure Enlive po BID, each supplement provides 350 kcal and 20 grams of protein (strawberry)  MVI with minerals daily    NUTRITION DIAGNOSIS:   Inadequate oral intake related to decreased appetite as evidenced by per patient/family report.    GOAL:   Patient will meet greater than or equal to 90% of their needs    MONITOR:   PO intake,Supplement acceptance,Weight trends,Labs,I & O's  REASON FOR ASSESSMENT:   Malnutrition Screening Tool    ASSESSMENT:  69 year old male with history significant of chronic dCHF (LVEF 50%), COPD, nicotine dependence, atrial fibrillation, CAD s/p CABG x 4, PAD s/p femoral endarterectomy and iliac stent (8/21) presented with 6 days of worsening shortness of breath. Pt admitted for acute hypoxic respiratory failure secondary to COPD exacerbation.  Patient sitting up in bed, reports feeling nauseas with multiple episodes of emesis that started overnight. He had not eaten breakfast, states the thought of food makes him nauseas. Patient reports progressive shortness of breath and decreased appetite over the past week, recalls mostly small meals/snacks at baseline. Patient endorses weight loss in the last few months, recalls usually weighing around 175 lbs. Per chart, on 8/25 he weighed 77.5 kg (170.5 lbs), on 9/23 he weighed 74.8 kg (164.56 lbs), on 10/24 he weighed 72.6 kg (159.72 lbs), on 12/2 he weighed 71.7 kg (157.74 lbs) and weighed 71.9 kg (158.18 lbs) on admission. This indicates ~12 lb (7.2%) decrease in weight in the last 3.5 months which is insignificant for time frame. While completing exam, pt reports having multiple recent lower extremity surgeries for PAD, last surgery in October for blood clot in left leg. Weight trends likely related to recent surgical history. RD educated on increased needs to support wound healing and the importance of adequate nutrition.  Pt appreciative and agreeable to drinking strawberry Ensure during admission as well as consuming daily supplement at home.   Medications reviewed and include: Lasix 40 mg po daily, Gabapentin, Zestril, Prednisone, Rocephin  Labs reviewed  NUTRITION - FOCUSED PHYSICAL EXAM:  Flowsheet Row Most Recent Value  Orbital Region Mild depletion  Upper Arm Region Mild depletion  Thoracic and Lumbar Region No depletion  Buccal Region Mild depletion  Temple Region Mild depletion  Clavicle Bone Region No depletion  Clavicle and Acromion Bone Region No depletion  Scapular Bone Region No depletion  Dorsal Hand No depletion  Patellar Region Mild depletion  Anterior Thigh Region No depletion  Posterior Calf Region Mild depletion  Edema (RD Assessment) None  Hair Reviewed  Eyes Reviewed  Mouth Reviewed  [wears dentures]  Skin Reviewed  Nails Reviewed       Diet Order:   Diet Order            Diet 2 gram sodium Room service appropriate? Yes; Fluid consistency: Thin  Diet effective now                 EDUCATION NEEDS:   Education needs have been addressed  Skin:  Skin Assessment: Skin Integrity Issues: Skin Integrity Issues:: Incisions Incisions: closed; right groin (10/24)  Last BM:  12/7  Height:   Ht Readings from Last 1 Encounters:  09/06/20 5\' 5"  (1.651 m)    Weight:   Wt Readings from Last 1 Encounters:  09/07/20 73.4 kg    BMI:  Body mass index is 26.94 kg/m.  Estimated Nutritional Needs:   Kcal:  2055-2200  Protein:  105-115  Fluid:  >2 L/day    Lars Masson, RD, LDN Clinical Nutrition After Hours/Weekend Pager # in Amion

## 2020-09-07 NOTE — Progress Notes (Signed)
   Spoke wiith Dr. Ashok Pall, to see if he felt the  need for me to see patient for heart failure.  He felt that this admission was not for acute diastolic failure exacerbation but more exacerbation of his COPD and no  would not see the patient at this time for heart failure.    Tresa Endo RN, CHFN

## 2020-09-07 NOTE — Consult Note (Signed)
Pharmacy Antibiotic Note  Dennis Preston is a 69 y.o. male admitted on 09/06/2020 with COPD .  Pharmacy has been consulted for levofloxacin dosing. Pt was on ceftriaxone for 2 doses.   Plan: Levofloxacin 500 mg q24H.   Height: 5\' 5"  (165.1 cm) Weight: 73.4 kg (161 lb 14.4 oz) IBW/kg (Calculated) : 61.5  Temp (24hrs), Avg:98.1 F (36.7 C), Min:97.8 F (36.6 C), Max:98.4 F (36.9 C)  Recent Labs  Lab 09/06/20 0215 09/06/20 1828 09/07/20 0516  WBC 7.2 6.1 10.7*  CREATININE 1.01  --  0.71    Estimated Creatinine Clearance: 75.8 mL/min (by C-G formula based on SCr of 0.71 mg/dL).    Allergies  Allergen Reactions  . Chlorhexidine      Thank you for allowing pharmacy to be a part of this patient's care.  14/10/21 09/07/2020 3:49 PM

## 2020-09-07 NOTE — Progress Notes (Addendum)
PROGRESS NOTE    Dennis Preston  UEA:[id number] DOB: Dennis Africa[birth year] DOA: 09/06/2020 PCP: Barbette ReichmannHande, Vishwanath, MD  Outpatient Specialists: cardiology    Brief Narrative:   Dennis AfricaDonald R Preston is a 69 y.o. male with medical history significant for chronic diastolic dysfunction CHF with last known LVEF of 50%, COPD, A. fib and nicotine dependence who presents to the ER for evaluation of shortness of breath for about 6 days.  Patient states that his symptoms have progressively worsened over the last couple of days and now he is short of breath even at rest.  Shortness of breath is associated with a cough productive of yellow phlegm.  He called EMS and when they arrived he was found to have room air pulse oximetry of 89%.  EMS administered 2 DuoNeb's and Solu-Medrol 125 mg IV. He denies having any sick contacts and is vaccinated against COVID-19 as well as influenza. He denies having any chest pain, no fever, no chills, no nausea, no vomiting, no abdominal pain or any changes in his bowel habits, no dizziness or lightheadedness  ED Course: Patient is a 69 year old Caucasian male with a history of COPD, A. fib, nicotine dependence who presents to the ER for evaluation of shortness of breath associated with a productive cough and wheezing over the last 6 days.  Upon arrival to the ER he was noted to be hypoxic with increased work of breathing.  His blood pressure was elevated with systolic blood pressure in the 160s and he was tachycardic as well.  Room air pulse oximetry was in the mid 80s.  Patient was placed on BiPAP to reduce work of breathing.  He will be admitted to the hospital for further evaluation.   Assessment & Plan:   Principal Problem:   Acute respiratory failure (HCC) Active Problems:   COPD exacerbation (HCC)   A-fib (HCC)   Coronary artery disease   Tobacco use   CHF (congestive heart failure) (HCC)   Hypokalemia  # COPD exacerbation with acute hypoxic respiratory failure Several  days productive cough and shortness of breath typical for one of his copd exacerbations. Here hypoxic to the mid 80s, placed on bipap, since weaned, now on 2 L breathing comfortably. Does not appear to be CHF exacerbation - no LE edema, bnp wnl. CXR w/o consolidation, flu/covid neg and is vaccinated.  - continue supplemental O2, wean as able - stop ceftriaxone, transition to levofloxacin - continue prednisone 40 qd, steroids started 12/9 - duonebs q6, albuterol q2 prn - IS and flutter valve - hold home trellegy  # Nausea/vomiting  This morning 2 episodes small volume nbnb emesis. Passing flatus, normal bowel sounds. No diarrhea. May be 2/2 steroids. No abd pain or tenderness. - zofran prn - monitor  # Diastolic heart failure # Multivessel CAD Appears to be at baseline - resume home lasix 40 qd - cont home plavix, aspirin, metoprolol  # Nicotine dependence -Smoking cessation was discussed with patient - He declines a nicotine transdermal patch at this time  # Paroxysmal A. Fib Currently in sinus rhythm - Continue metoprolol - Patient not on long-term anticoagulation therapy  # PAD  S/p recent interventions that appear to be healed. Extremities warm - cont asa, plavix, statin   DVT prophylaxis: lovenox Code Status: full Family Communication: none @ bedside  Status is: Inpatient  Remains inpatient appropriate because:Inpatient level of care appropriate due to severity of illness   Dispo: The patient is from: Home  Anticipated d/c is to: Home (pt/ot have evaluated, do not think he needs further assistance)              Anticipated d/c date is: 1-3 days              Patient currently is not medically stable to d/c.        Consultants:  none  Procedures: none  Antimicrobials:  Ceftriaxone 12/9>    Subjective: This morning thinks breathing and cough have improved. Did have 2 episodes nbnb emesis small volume and is nauseaus. No diarrhea or fever  or abd pain.  Objective: Vitals:   09/07/20 0229 09/07/20 0340 09/07/20 0341 09/07/20 0902  BP:  109/64  (!) 139/47  Pulse:  88  71  Resp:    18  Temp:  98 F (36.7 C)  98.3 F (36.8 C)  TempSrc:  Oral  Oral  SpO2: 92% 94%  96%  Weight:   73.4 kg   Height:        Intake/Output Summary (Last 24 hours) at 09/07/2020 0918 Last data filed at 09/06/2020 1857 Gross per 24 hour  Intake 723 ml  Output 450 ml  Net 273 ml   Filed Weights   09/06/20 0221 09/06/20 1631 09/07/20 0341  Weight: 71.7 kg 71.9 kg 73.4 kg    Examination:  General exam: Appears calm and comfortable  Respiratory system: faint exp rhonchi, no wheeze Cardiovascular system: S1 & S2 heard, RRR. Moderate harsh systolic murmur mGastrointestinal system: Abdomen is mildly distended, soft and nontender. No organomegaly or masses felt. Normal bowel sounds heard. Central nervous system: Alert and oriented. No focal neurological deficits. Extremities: Symmetric 5 x 5 power. Skin: No rashes, lesions or ulcers. B/l groin healed surgical incision scars Psychiatry: Judgement and insight appear normal. Mood & affect appropriate.     Data Reviewed: I have personally reviewed following labs and imaging studies  CBC: Recent Labs  Lab 09/06/20 0215 09/06/20 1828 09/07/20 0516  WBC 7.2 6.1 10.7*  NEUTROABS  --  5.3  --   HGB 13.8 12.5* 13.2  HCT 43.7 39.2 40.9  MCV 90.7 89.9 90.1  PLT 262 277 314   Basic Metabolic Panel: Recent Labs  Lab 09/06/20 0215 09/07/20 0516  NA 142 141  K 3.4* 4.6  CL 101 100  CO2 30 30  GLUCOSE 139* 168*  BUN 11 19  CREATININE 1.01 0.71  CALCIUM 8.7* 9.0   GFR: Estimated Creatinine Clearance: 75.8 mL/min (by C-G formula based on SCr of 0.71 mg/dL). Liver Function Tests: No results for input(s): AST, ALT, ALKPHOS, BILITOT, PROT, ALBUMIN in the last 168 hours. No results for input(s): LIPASE, AMYLASE in the last 168 hours. No results for input(s): AMMONIA in the last 168  hours. Coagulation Profile: No results for input(s): INR, PROTIME in the last 168 hours. Cardiac Enzymes: No results for input(s): CKTOTAL, CKMB, CKMBINDEX, TROPONINI in the last 168 hours. BNP (last 3 results) No results for input(s): PROBNP in the last 8760 hours. HbA1C: No results for input(s): HGBA1C in the last 72 hours. CBG: No results for input(s): GLUCAP in the last 168 hours. Lipid Profile: No results for input(s): CHOL, HDL, LDLCALC, TRIG, CHOLHDL, LDLDIRECT in the last 72 hours. Thyroid Function Tests: No results for input(s): TSH, T4TOTAL, FREET4, T3FREE, THYROIDAB in the last 72 hours. Anemia Panel: No results for input(s): VITAMINB12, FOLATE, FERRITIN, TIBC, IRON, RETICCTPCT in the last 72 hours. Urine analysis:    Component Value Date/Time  COLORURINE YELLOW 05/10/2008 1654   APPEARANCEUR CLEAR 05/10/2008 1654   LABSPEC 1.021 05/10/2008 1654   PHURINE 5.5 05/10/2008 1654   GLUCOSEU NEGATIVE 05/10/2008 1654   HGBUR NEGATIVE 05/10/2008 1654   BILIRUBINUR NEGATIVE 05/10/2008 1654   KETONESUR NEGATIVE 05/10/2008 1654   PROTEINUR NEGATIVE 05/10/2008 1654   UROBILINOGEN 0.2 05/10/2008 1654   NITRITE NEGATIVE 05/10/2008 1654   LEUKOCYTESUR  05/10/2008 1654    NEGATIVE MICROSCOPIC NOT DONE ON URINES WITH NEGATIVE PROTEIN, BLOOD, LEUKOCYTES, NITRITE, OR GLUCOSE <1000 mg/dL.   Sepsis Labs: @LABRCNTIP (procalcitonin:4,lacticidven:4)  ) Recent Results (from the past 240 hour(s))  Resp Panel by RT-PCR (Flu A&B, Covid) Nasopharyngeal Swab     Status: None   Collection Time: 09/06/20  2:15 AM   Specimen: Nasopharyngeal Swab; Nasopharyngeal(NP) swabs in vial transport medium  Result Value Ref Range Status   SARS Coronavirus 2 by RT PCR NEGATIVE NEGATIVE Final    Comment: (NOTE) SARS-CoV-2 target nucleic acids are NOT DETECTED.  The SARS-CoV-2 RNA is generally detectable in upper respiratory specimens during the acute phase of infection. The lowest concentration of  SARS-CoV-2 viral copies this assay can detect is 138 copies/mL. A negative result does not preclude SARS-Cov-2 infection and should not be used as the sole basis for treatment or other patient management decisions. A negative result may occur with  improper specimen collection/handling, submission of specimen other than nasopharyngeal swab, presence of viral mutation(s) within the areas targeted by this assay, and inadequate number of viral copies(<138 copies/mL). A negative result must be combined with clinical observations, patient history, and epidemiological information. The expected result is Negative.  Fact Sheet for Patients:  14/09/21  Fact Sheet for Healthcare Providers:  BloggerCourse.com  This test is no t yet approved or cleared by the [employer] FDA and  has been authorized for detection and/or diagnosis of SARS-CoV-2 by FDA under an Emergency Use Authorization (EUA). This EUA will remain  in effect (meaning this test can be used) for the duration of the COVID-19 declaration under Section 564(b)(1) of the Act, 21 U.S.C.section 360bbb-3(b)(1), unless the authorization is terminated  or revoked sooner.       Influenza A by PCR NEGATIVE NEGATIVE Final   Influenza B by PCR NEGATIVE NEGATIVE Final    Comment: (NOTE) The Xpert Xpress SARS-CoV-2/FLU/RSV plus assay is intended as an aid in the diagnosis of influenza from Nasopharyngeal swab specimens and should not be used as a sole basis for treatment. Nasal washings and aspirates are unacceptable for Xpert Xpress SARS-CoV-2/FLU/RSV testing.  Fact Sheet for Patients: [place]  Fact Sheet for Healthcare Providers: BloggerCourse.com  This test is not yet approved or cleared by the [employer] FDA and has been authorized for detection and/or diagnosis of SARS-CoV-2 by FDA under an Emergency Use  Authorization (EUA). This EUA will remain in effect (meaning this test can be used) for the duration of the COVID-19 declaration under Section 564(b)(1) of the Act, 21 U.S.C. section 360bbb-3(b)(1), unless the authorization is terminated or revoked.  Performed at [facility], [address], [place], [place]          Radiology Studies: DG Chest Portable 1 View  Result Date: 09/06/2020 CLINICAL DATA:  Shortness of breath EXAM: PORTABLE CHEST 1 VIEW COMPARISON:  Feb 13, 2020 FINDINGS: The heart size and mediastinal contours are within normal limits. Aortic knob calcifications are seen. Overlying median sternotomy wires are present. There is prominence of the central pulmonary vasculature. The visualized skeletal structures are unremarkable.  IMPRESSION: Pulmonary vascular congestion. Electronically Signed   By: Jonna Clark M.D.   On: 09/06/2020 02:55        Scheduled Meds: . aspirin EC  81 mg Oral Daily  . atorvastatin  80 mg Oral q1800  . clopidogrel  75 mg Oral QPM  . enoxaparin (LOVENOX) injection  40 mg Subcutaneous Q24H  . fluticasone furoate-vilanterol  1 puff Inhalation QODAY   And  . umeclidinium bromide  1 puff Inhalation QODAY  . furosemide  40 mg Intravenous Daily  . gabapentin  300 mg Oral QHS  . ipratropium-albuterol  3 mL Nebulization Q6H  . lisinopril  10 mg Oral Daily  . metoprolol succinate  25 mg Oral Daily  . predniSONE  40 mg Oral Q breakfast  . sodium chloride flush  3 mL Intravenous Q12H   Continuous Infusions: . sodium chloride    . cefTRIAXone (ROCEPHIN)  IV 1 g (09/07/20 0832)     LOS: 1 day    Time spent: 40 min    Silvano Bilis, MD [facility]   If 7PM-7AM, please contact night-coverage [email or web address] Password [email or web address] 09/07/2020, 9:18 AM

## 2020-09-08 MED ORDER — PREDNISONE 20 MG PO TABS
40.0000 mg | ORAL_TABLET | Freq: Every day | ORAL | 0 refills | Status: DC
Start: 2020-09-09 — End: 2020-09-13

## 2020-09-08 MED ORDER — LEVOFLOXACIN 500 MG PO TABS
500.0000 mg | ORAL_TABLET | Freq: Every day | ORAL | 0 refills | Status: DC
Start: 2020-09-09 — End: 2020-09-13

## 2020-09-08 NOTE — Discharge Summary (Signed)
Dennis Preston LZJ:[id number] DOB: 10-22-1950 DOA: 09/06/2020  PCP: Barbette Reichmann, MD  Admit date: 09/06/2020 Discharge date: 09/08/2020  Time spent: 35 minutes  Recommendations for Outpatient Follow-up:  1. Pulmonary f/u for copd 2. Cardiology f/u including for discussion of anticoagulation    Discharge Diagnoses:  Principal Problem:   Acute respiratory failure (HCC) Active Problems:   COPD exacerbation (HCC)   A-fib (HCC)   Coronary artery disease   Tobacco use   CHF (congestive heart failure) (HCC)   Hypokalemia   Discharge Condition: fair  Diet recommendation: heart healthy  Filed Weights   09/06/20 1631 09/07/20 0341 09/08/20 0439  Weight: 71.9 kg 73.4 kg 73.7 kg    History of present illness:  Dennis Preston is a 69 y.o. male with medical history significant for chronic diastolic dysfunction CHF with last known LVEF of 50%, COPD, A. fib and nicotine dependence who presents to the ER for evaluation of shortness of breath for about 6 days.  Patient states that his symptoms have progressively worsened over the last couple of days and now he is short of breath even at rest.  Shortness of breath is associated with a cough productive of yellow phlegm.  He called EMS and when they arrived he was found to have room air pulse oximetry of 89%.  EMS administered 2 DuoNeb's and Solu-Medrol 125 mg IV. He denies having any sick contacts and is vaccinated against COVID-19 as well as influenza. He denies having any chest pain, no fever, no chills, no nausea, no vomiting, no abdominal pain or any changes in his bowel habits, no dizziness or lightheadedness.  Hospital Course:  # COPD exacerbation with acute hypoxic respiratory failure Several days productive cough and shortness of breath typical for one of his copd exacerbations. Here hypoxic to the mid 80s, placed on bipap, since weaned, now off o2 satting about 90% on room air, desat to 85 with ambulation. Patient says he has been  prescribed oxygen for prn use and has it at home. He desires discharge. Advised resuming home O2 with ambulation for now.  - rx to finish 5 day course of abx (levaquin) and prednisone - close pulmonology f/u  # Nausea/vomiting  Resolved  # Diastolic heart failure # Multivessel CAD Appears to be at baseline, does not appear to be CHF exacerbation. - continued home lasix, plavix, aspirin, metoprolol  # Paroxysmal A. Fib Currently in sinus rhythm - Continue metoprolol - Patient not anticoagulated, will need attention to this at cardiology f/u  # PAD  S/p recent interventions that appear to be healed. Extremities warm - cont asa, plavix, statin  Procedures:  none   Consultations:  none  Discharge Exam: Vitals:   09/08/20 0729 09/08/20 1122  BP: 131/70 (!) 129/51  Pulse: (!) 53 68  Resp: 18 18  Temp: 98.6 F (37 C) 98.4 F (36.9 C)  SpO2: 96% (!) 85%    General exam: Appears calm and comfortable  Respiratory system: faint exp rhonchi, no wheeze Cardiovascular system: S1 & S2 heard, RRR. Moderate harsh systolic murmur Gastrointestinal system: Abdomen is mildly distended, soft and nontender. No organomegaly or masses felt. Normal bowel sounds heard. Central nervous system: Alert and oriented. No focal neurological deficits. Extremities: Symmetric 5 x 5 power. Skin: No rashes, lesions or ulcers. B/l groin healed surgical incision scars Psychiatry: Judgement and insight appear normal. Mood & affect appropriate.  Discharge Instructions   Discharge Instructions    Diet - low sodium heart healthy  Complete by: As directed    Increase activity slowly   Complete by: As directed      Allergies as of 09/08/2020      Reactions   Chlorhexidine       Medication List    TAKE these medications   acetaminophen 325 MG tablet Commonly known as: TYLENOL Take 650 mg by mouth every 6 (six) hours as needed for mild pain or headache.   albuterol 108 (90 Base) MCG/ACT  inhaler Commonly known as: VENTOLIN HFA Inhale 1-2 puffs into the lungs every 6 (six) hours as needed for wheezing or shortness of breath.   albuterol (2.5 MG/3ML) 0.083% nebulizer solution Commonly known as: PROVENTIL Take 3 mLs (2.5 mg total) by nebulization every 6 (six) hours as needed for wheezing or shortness of breath.   aspirin EC 81 MG tablet Take 1 tablet (81 mg total) by mouth daily. Swallow whole.   atorvastatin 80 MG tablet Commonly known as: LIPITOR Take 1 tablet (80 mg total) by mouth daily at 6 PM.   benzonatate 200 MG capsule Commonly known as: TESSALON Take 200 mg by mouth 3 (three) times daily as needed.   clopidogrel 75 MG tablet Commonly known as: PLAVIX Take 75 mg by mouth every evening.   docusate sodium 100 MG capsule Commonly known as: COLACE Take 1 capsule (100 mg total) by mouth 2 (two) times daily.   furosemide 40 MG tablet Commonly known as: LASIX Take 40 mg by mouth daily.   gabapentin 300 MG capsule Commonly known as: NEURONTIN Take 1 capsule (300 mg total) by mouth at bedtime.   levofloxacin 500 MG tablet Commonly known as: LEVAQUIN Take 1 tablet (500 mg total) by mouth daily. Start taking on: September 09, 2020   oxyCODONE-acetaminophen 5-325 MG tablet Commonly known as: PERCOCET/ROXICET Take 1 tablet by mouth every 6 (six) hours as needed for moderate pain or severe pain.   predniSONE 20 MG tablet Commonly known as: DELTASONE Take 2 tablets (40 mg total) by mouth daily with breakfast. Start taking on: September 09, 2020   senna-docusate 8.6-50 MG tablet Commonly known as: Senokot-S Take 1 tablet by mouth at bedtime as needed for mild constipation.   Trelegy Ellipta 100-62.5-25 MCG/INH Aepb Generic drug: Fluticasone-Umeclidin-Vilant Inhale 1 puff into the lungs every other day.      Allergies  Allergen Reactions  . Chlorhexidine     Follow-up Information    [facility] Follow up on  09/13/2020.   Specialty: Cardiology Why: at 11:30am. Enter through the Medical Mall entrance Contact information: [address] [place] [address] [phone]       Mertie Moores, MD. Schedule an appointment as soon as possible for a visit.   Specialty: Specialist Contact information: [address] [place] [address] [phone]                The results of significant diagnostics from this hospitalization (including imaging, microbiology, ancillary and laboratory) are listed below for reference.    Significant Diagnostic Studies: DG Chest Portable 1 View  Result Date: 09/06/2020 CLINICAL DATA:  Shortness of breath EXAM: PORTABLE CHEST 1 VIEW COMPARISON:  Feb 13, 2020 FINDINGS: The heart size and mediastinal contours are within normal limits. Aortic knob calcifications are seen. Overlying median sternotomy wires are present. There is prominence of the central pulmonary vasculature. The visualized skeletal structures are unremarkable. IMPRESSION: Pulmonary vascular congestion. Electronically Signed   By: Kandis Fantasia  Avutu M.D.   On: 09/06/2020 02:55   VAS [place] GROIN PSEUDOANEURYSM  Result Date: 08/17/2020  ARTERIAL PSEUDOANEURYSM  Exam: Left groin History: Previous pseudoaneurysm and surgical repair. Performing Technologist: Salvadore Farber RVT  Examination Guidelines: A complete evaluation includes B-mode imaging, spectral Doppler, color Doppler, and power Doppler as needed of all accessible portions of each vessel. Bilateral testing is considered an integral part of a complete examination. Limited examinations for reoccurring indications may be performed as noted. +-----------+----------+--------+------+----------+ Left DuplexPSV (cm/s)WaveformPlaqueComment(s) +-----------+----------+--------+------+----------+ CFA           115                             +-----------+----------+--------+------+----------+ Prox SFA       68                     stent    +-----------+----------+--------+------+----------+ Left Vein comments:  Summary: Left groin shows no evidence of pseudoaneurysm. There is a large mostly fluid filled seroma from groin lateral to surgical site extending 8.3cm distally. No evidence of blood flow in this area. Normal flow seen in the left CFA and new SFA stent. Diagnosing physician: [name] MD Electronically signed by [name] MD on 08/17/2020 at 9:32:56 AM.   --------------------------------------------------------------------------------    Final    CT CHEST LUNG CANCER SCREENING LOW DOSE WO CONTRAST  Result Date: 08/30/2020 CLINICAL DATA:  69 year old male with 55 pack-year history of smoking. Lung cancer screening. EXAM: CT CHEST WITHOUT CONTRAST LOW-DOSE FOR LUNG CANCER SCREENING TECHNIQUE: Multidetector CT imaging of the chest was performed following the standard protocol without IV contrast. COMPARISON:  08/18/2019 FINDINGS: Cardiovascular: The heart size is normal. No substantial pericardial effusion. Coronary artery calcification is evident. Status post CABG. Atherosclerotic calcification is noted in the wall of the thoracic aorta. Mediastinum/Nodes: No mediastinal lymphadenopathy. No evidence for gross hilar lymphadenopathy although assessment is limited by the lack of intravenous contrast on today's study. The esophagus has normal imaging features. There is no axillary lymphadenopathy. Lungs/Pleura: Centrilobular emphsyema noted. Previously identified tiny pulmonary nodules are stable. No new suspicious pulmonary nodule or mass. No focal airspace consolidation. No pleural effusion. Upper Abdomen: Unremarkable. Musculoskeletal: No worrisome lytic or sclerotic osseous abnormality. IMPRESSION: 1. Lung-RADS 2, benign appearance or behavior. Continue annual screening with low-dose chest CT without contrast in 12 months. 2.  Emphysema (ICD10-J43.9) and Aortic Atherosclerosis (ICD10-170.0) Electronically Signed    By: Kennith Center M.D.   On: 08/30/2020 12:10    Microbiology: Recent Results (from the past 240 hour(s))  Resp Panel by RT-PCR (Flu A&B, Covid) Nasopharyngeal Swab     Status: None   Collection Time: 09/06/20  2:15 AM   Specimen: Nasopharyngeal Swab; Nasopharyngeal(NP) swabs in vial transport medium  Result Value Ref Range Status   SARS Coronavirus 2 by RT PCR NEGATIVE NEGATIVE Final    Comment: (NOTE) SARS-CoV-2 target nucleic acids are NOT DETECTED.  The SARS-CoV-2 RNA is generally detectable in upper respiratory specimens during the acute phase of infection. The lowest concentration of SARS-CoV-2 viral copies this assay can detect is 138 copies/mL. A negative result does not preclude SARS-Cov-2 infection and should not be used as the sole basis for treatment or other patient management decisions. A negative result may occur with  improper specimen collection/handling, submission of specimen other than nasopharyngeal swab, presence of viral mutation(s) within the areas targeted by this assay, and inadequate number of viral copies(<138 copies/mL). A  negative result must be combined with clinical observations, patient history, and epidemiological information. The expected result is Negative.  Fact Sheet for Patients:  BloggerCourse.comhttps://www.fda.gov/media/152166/download  Fact Sheet for Healthcare Providers:  SeriousBroker.ithttps://www.fda.gov/media/152162/download  This test is no t yet approved or cleared by the [place] FDA and  has been authorized for detection and/or diagnosis of SARS-CoV-2 by FDA under an Emergency Use Authorization (EUA). This EUA will remain  in effect (meaning this test can be used) for the duration of the COVID-19 declaration under Section 564(b)(1) of the Act, 21 U.S.C.section 360bbb-3(b)(1), unless the authorization is terminated  or revoked sooner.       Influenza A by PCR NEGATIVE NEGATIVE Final   Influenza B by PCR NEGATIVE NEGATIVE Final    Comment:  (NOTE) The Xpert Xpress SARS-CoV-2/FLU/RSV plus assay is intended as an aid in the diagnosis of influenza from Nasopharyngeal swab specimens and should not be used as a sole basis for treatment. Nasal washings and aspirates are unacceptable for Xpert Xpress SARS-CoV-2/FLU/RSV testing.  Fact Sheet for Patients: BloggerCourse.comhttps://www.fda.gov/media/152166/download  Fact Sheet for Healthcare Providers: SeriousBroker.ithttps://www.fda.gov/media/152162/download  This test is not yet approved or cleared by the [place] FDA and has been authorized for detection and/or diagnosis of SARS-CoV-2 by FDA under an Emergency Use Authorization (EUA). This EUA will remain in effect (meaning this test can be used) for the duration of the COVID-19 declaration under Section 564(b)(1) of the Act, 21 U.S.C. section 360bbb-3(b)(1), unless the authorization is terminated or revoked.  Performed at [facility], [address] [place], [place] [address]      Labs: Basic Metabolic Panel: Recent Labs  Lab 09/06/20 0215 09/07/20 0516  NA 142 141  K 3.4* 4.6  CL 101 100  CO2 30 30  GLUCOSE 139* 168*  BUN 11 19  CREATININE 1.01 0.71  CALCIUM 8.7* 9.0   Liver Function Tests: No results for input(s): AST, ALT, ALKPHOS, BILITOT, PROT, ALBUMIN in the last 168 hours. No results for input(s): LIPASE, AMYLASE in the last 168 hours. No results for input(s): AMMONIA in the last 168 hours. CBC: Recent Labs  Lab 09/06/20 0215 09/06/20 1828 09/07/20 0516  WBC 7.2 6.1 10.7*  NEUTROABS  --  5.3  --   HGB 13.8 12.5* 13.2  HCT 43.7 39.2 40.9  MCV 90.7 89.9 90.1  PLT 262 277 314   Cardiac Enzymes: No results for input(s): CKTOTAL, CKMB, CKMBINDEX, TROPONINI in the last 168 hours. BNP: BNP (last 3 results) Recent Labs    01/12/20 1335 02/13/20 0844 09/06/20 0215  BNP 62.0 53.1 73.4    ProBNP (last 3 results) No results for input(s): PROBNP in the last 8760 hours.  CBG: No results for input(s): GLUCAP  in the last 168 hours.     Signed:  Silvano BilisNoah B Zaria Taha MD.  [facility] 09/08/2020, 1:28 PM

## 2020-09-08 NOTE — Discharge Instructions (Signed)
Chronic Obstructive Pulmonary Disease Exacerbation  Chronic obstructive pulmonary disease (COPD) is a long-term (chronic) condition that affects the lungs. COPD is a general term that can be used to describe many different lung problems that cause lung swelling (inflammation) and limit airflow, including chronic bronchitis and emphysema. COPD exacerbations are episodes when breathing symptoms become much worse and require extra treatment. COPD exacerbations are usually caused by infections. Without treatment, COPD exacerbations can be severe and even life threatening. Frequent COPD exacerbations can cause further damage to the lungs. What are the causes? This condition may be caused by:  Respiratory infections, including viral and bacterial infections.  Exposure to smoke.  Exposure to air pollution, chemical fumes, or dust.  Things that give you an allergic reaction (allergens).  Not taking your usual COPD medicines as directed.  Underlying medical problems, such as congestive heart failure or infections not involving the lungs. In many cases, the cause (trigger) of this condition is not known. What increases the risk? The following factors may make you more likely to develop this condition:  Smoking cigarettes.  Old age.  Frequent prior COPD exacerbations. What are the signs or symptoms? Symptoms of this condition include:  Increased coughing.  Increased production of mucus from your lungs (sputum).  Increased wheezing.  Increased shortness of breath.  Rapid or labored breathing.  Chest tightness.  Less energy than usual.  Sleep disruption from symptoms.  Confusion or increased sleepiness. Often these symptoms happen or get worse even with the use of medicines. How is this diagnosed? This condition is diagnosed based on:  Your medical history.  A physical exam. You may also have tests, including:  A chest X-ray.  Blood tests.  Lung (pulmonary) function  tests. How is this treated? Treatment for this condition depends on the severity and cause of the symptoms. You may need to be admitted to a hospital for treatment. Some of the treatments commonly used to treat COPD exacerbations are:  Antibiotic medicines. These may be used for severe exacerbations caused by a lung infection, such as pneumonia.  Bronchodilators. These are inhaled medicines that expand the air passages and allow increased airflow.  Steroid medicines. These act to reduce inflammation in the airways. They may be given with an inhaler, taken by mouth, or given through an IV tube inserted into one of your veins.  Supplemental oxygen therapy.  Airway clearing techniques, such as noninvasive ventilation (NIV) and positive expiratory pressure (PEP). These provide respiratory support through a mask or other noninvasive device. An example of this would be using a continuous positive airway pressure (CPAP) machine to improve delivery of oxygen into your lungs. Follow these instructions at home: Medicines  Take over-the-counter and prescription medicines only as told by your health care provider. It is important to use correct technique with inhaled medicines.  If you were prescribed an antibiotic medicine or oral steroid, take it as told by your health care provider. Do not stop taking the medicine even if you start to feel better. Lifestyle  Eat a healthy diet.  Exercise regularly.  Get plenty of sleep.  Avoid exposure to all substances that irritate the airway, especially to tobacco smoke.  Wash your hands often with soap and water to reduce the risk of infection. If soap and water are not available, use hand sanitizer.  During flu season, avoid enclosed spaces that are crowded with people. General instructions  Drink enough fluid to keep your urine clear or pale yellow (unless you have a medical   condition that requires fluid restriction).  Use a cool mist vaporizer. This  humidifies the air and makes it easier for you to clear your chest when you cough.  If you have a home nebulizer and oxygen, continue to use them as told by your health care provider.  Keep all follow-up visits as told by your health care provider. This is important. How is this prevented?  Stay up-to-date on pneumococcal and influenza (flu) vaccines. A flu shot is recommended every year to help prevent exacerbations.  Do not use any products that contain nicotine or tobacco, such as cigarettes and e-cigarettes. Quitting smoking is very important in preventing COPD from getting worse and in preventing exacerbations from happening as often. If you need help quitting, ask your health care provider.  Follow all instructions for pulmonary rehabilitation after a recent exacerbation. This can help prevent future exacerbations.  Work with your health care provider to develop and follow an action plan. This tells you what steps to take when you experience certain symptoms. Contact a health care provider if:  You have a worsening of your regular COPD symptoms. Get help right away if:  You have worsening shortness of breath, even when resting.  You have trouble talking.  You have severe chest pain.  You cough up blood.  You have a fever.  You have weakness, vomit repeatedly, or faint.  You feel confused.  You are not able to sleep because of your symptoms.  You have trouble doing daily activities. Summary  COPD exacerbations are episodes when breathing symptoms become much worse and require extra treatment above your normal treatment.  Exacerbations can be severe and even life threatening. Frequent COPD exacerbations can cause further damage to your lungs.  COPD exacerbations are usually triggered by infections such as the flu, colds, and even pneumonia.  Treatment for this condition depends on the severity and cause of the symptoms. You may need to be admitted to a hospital for  treatment.  Quitting smoking is very important to prevent COPD from getting worse and to prevent exacerbations from happening as often. This information is not intended to replace advice given to you by your health care provider. Make sure you discuss any questions you have with your health care provider. Document Revised: 08/28/2017 Document Reviewed: 10/20/2016 Elsevier Patient Education  2020 Elsevier Inc.  

## 2020-09-08 NOTE — Progress Notes (Signed)
Patient discharged to home. Tele and IV d/c'd.  Verbalizes understanding of discharge instructions. 

## 2020-09-08 NOTE — Progress Notes (Signed)
Mobility Specialist - Progress Note   09/08/20 1212  Mobility  Activity Ambulated in room;Ambulated in hall  Level of Assistance Modified independent, requires aide device or extra time  Assistive Device Front wheel walker  Distance Ambulated (ft) 200 ft  Mobility Response Tolerated well  Mobility performed by Mobility specialist  $Mobility charge 1 Mobility    Pre-mobility: 65 HR, 87% SpO2 During mobility: 77 HR, 82% SpO2 Post-mobility: 55 HR, 90% SpO2   Pt laying in bed upon arrival. Pt agreed to session. Pt mod. Independent t/o session. Pt requested to utilize RW during session for safety. Pt states he mobilized w/o an AD PTA. Pt ambulated 200' total in room and hallway. Noted after ~ 100', pt required standing rest break d/t SOB. Pt desat to 82%. Pt on RA. Pt encouraged to utilize PLB. O2 sat up to 85% after a few minutes. No LOB noted. No c/o pain. Overall, pt tolerated session well. Pt motivated d/t wanting to be dc. Requested to ambulate again later w/o AD. Will try to attempt another session after lunch if time permits. Pt left sitting EOB w/ all needs placed in reach. Nurse was notified.     Rifky Lapre Mobility Specialist  09/08/20, 12:17 PM

## 2020-09-12 NOTE — Progress Notes (Addendum)
Patient ID: Dennis Preston, male    DOB: [birth year], 69 y.o.   MRN: [id number]  HPI  Dennis Preston is a 69 y/o male with a history of CAD, hyperlipidemia, HTN, depression, COPD, PAD, atrial fibrillation, previous tobacco/alcohol use and chronic heart failure.   Echo report from 01/11/20 reviewed and showed an EF of 50% along with trivial Dennis.   LHC done 01/23/20 reviewed and showed:  LM lesion is 90% stenosed.  Ost 1st Diag lesion is 90% stenosed.  Ramus lesion is 90% stenosed.  Ost Cx lesion is 75% stenosed.  Prox RCA lesion is 50% stenosed.  Mid RCA lesion is 75% stenosed.  Dist RCA-1 lesion is 75% stenosed.  Dist RCA-2 lesion is 80% stenosed.  RPAV-2 lesion is 80% stenosed.  RPAV-1 lesion is 90% stenosed.  Mid LM lesion is 90% stenosed.  Ost LAD lesion is 90% stenosed.  Origin lesion is 100% stenosed.  Mid LAD lesion is 100% stenosed.   Conclusion Conclusion Successful diagnostic cardiac cath Mildly depressed left ventricular function with inferior hypokinesis EF around 45 to 50% Severe multivessel coronary disease including distal left main ostial LAD ostial circumflex Patent grafts LIMA to the LAD SVG to distal RCA SVG to ramus Occluded graft to distal circumflex  Admitted 09/06/20 due to shortness of breath due to COPD exacerbation. Initially had pulse ox of 89% and was placed on bipap. Given nebulizer and solu-medrol. Given antibiotics and prednisone. Discharged after 2 days.   He presents today for his initial visit with a chief complaint of minimal fatigue upon moderate exertion. He describes this as chronic in nature having been present for several months. He has associated productive cough, shortness of breath, left thigh numbness & easy bruising along with this. He denies any difficulty sleeping, abdominal distention, palpitations, pedal edema, chest pain, dizziness, wheezing or weight gain.   He says that he has weakness in his legs due to numerous vascular  surgeries.  Past Medical History:  Diagnosis Date  . Alcohol abuse   . Amaurosis fugax   . Atrial fibrillation (HCC)   . Carotid stenosis   . CHF (congestive heart failure) (HCC)   . Coagulopathy (HCC)   . COPD (chronic obstructive pulmonary disease) (HCC)   . Coronary artery disease   . Depression   . Hyperlipemia   . Hypertension   . Hypotension   . Leucocytosis   . Liver dysfunction   . Myocardial infarction (HCC)    non-STEMI. approx 2016  . Peripheral artery disease (HCC)    legs  . Tobacco abuse   . Wears dentures    full upper and lower   Past Surgical History:  Procedure Laterality Date  . APPLICATION OF WOUND VAC Right 06/22/2020   Procedure: APPLICATION OF WOUND VAC;  Surgeon: Annice Needy, MD;  Location: [facility];  Service: Vascular;  Laterality: Right;  . CARDIAC CATHETERIZATION N/A 07/10/2015   Procedure: Right/Left Heart Cath and Coronary Angiography;  Surgeon: Alwyn Pea, MD;  Location: [facility];  Service: Cardiovascular;  Laterality: N/A;  . CORONARY ARTERY BYPASS GRAFT  07/13/2015   [facility].  4 vessel  . ENDARTERECTOMY FEMORAL Bilateral 05/23/2020   Procedure: ENDARTERECTOMY FEMORAL BILATERAL SUPERFICIAL FEMORAL ARTERY STENTS ;  Surgeon: Renford Dills, MD;  Location: [facility];  Service: Vascular;  Laterality: Bilateral;  . FALSE ANEURYSM REPAIR Left 07/22/2020   Procedure: REPAIR LEFT FEMORAL PSEUDOANUERYSM;  Surgeon: Bertram Denver, MD;  Location: [facility];  Service: Vascular;  Laterality: Left;  . INSERTION OF ILIAC STENT Left 05/23/2020   Procedure: INSERTION OF ILIAC STENT;  Surgeon: Renford Dills, MD;  Location: [facility];  Service: Vascular;  Laterality: Left;  . LEFT HEART CATH AND CORS/GRAFTS ANGIOGRAPHY N/A 01/23/2020   Procedure: LEFT HEART CATH AND CORS/GRAFTS ANGIOGRAPHY;  Surgeon: Alwyn Pea, MD;  Location: [facility];  Service: Cardiovascular;  Laterality: N/A;  . LOWER EXTREMITY ANGIOGRAPHY Right  04/03/2020   Procedure: LOWER EXTREMITY ANGIOGRAPHY;  Surgeon: Renford Dills, MD;  Location: [facility];  Service: Cardiovascular;  Laterality: Right;  . OLECRANON BURSECTOMY Left 05/06/2019   Procedure: OLECRANON BURSECTOMY AND DEBRIDEMENT;  Surgeon: Signa Kell, MD;  Location: [facility];  Service: Orthopedics;  Laterality: Left;  . WOUND DEBRIDEMENT Right 06/22/2020   Procedure: DEBRIDEMENT WOUND RIGHT GROIN;  Surgeon: Annice Needy, MD;  Location: [facility];  Service: Vascular;  Laterality: Right;   Family History  Problem Relation Age of Onset  . Depression Sister    Social History   Tobacco Use  . Smoking status: Former Smoker    Packs/day: 1.00    Years: 54.00    Pack years: 54.00    Types: Cigarettes    Quit date: 03/16/2020    Years since quitting: 0.4  . Smokeless tobacco: Never Used  . Tobacco comment: was 2.5 PPD for 50 yrs until 2016, quit a "few days ago"  Substance Use Topics  . Alcohol use: Not Currently    Comment: quit over 15 yrs ago   Allergies  Allergen Reactions  . Chlorhexidine    Prior to Admission medications   Medication Sig Start Date End Date Taking? Authorizing Provider  acetaminophen (TYLENOL) 325 MG tablet Take 650 mg by mouth every 6 (six) hours as needed for mild pain or headache.    Yes [provider]  albuterol (PROVENTIL) (2.5 MG/3ML) 0.083% nebulizer solution Take 3 mLs (2.5 mg total) by nebulization every 6 (six) hours as needed for wheezing or shortness of breath. 02/15/20  Yes Lurene Shadow, MD  albuterol (VENTOLIN HFA) 108 (90 Base) MCG/ACT inhaler Inhale 1-2 puffs into the lungs every 6 (six) hours as needed for wheezing or shortness of breath. 02/15/20  Yes Lurene Shadow, MD  aspirin EC 81 MG tablet Take 1 tablet (81 mg total) by mouth daily. Swallow whole. 04/03/20 04/03/21 Yes Schnier, Latina Craver, MD  atorvastatin (LIPITOR) 80 MG tablet Take 1 tablet (80 mg total) by mouth daily at 6 PM. 07/10/15  Yes Sainani, Rolly Pancake, MD   benzonatate (TESSALON) 200 MG capsule Take 200 mg by mouth 3 (three) times daily as needed. 09/05/20  Yes [provider]  clopidogrel (PLAVIX) 75 MG tablet Take 75 mg by mouth every evening.  04/18/16  Yes [provider]  Fluticasone-Umeclidin-Vilant (TRELEGY ELLIPTA) 100-62.5-25 MCG/INH AEPB Inhale 1 puff into the lungs every other day. 01/27/20  Yes [provider]  furosemide (LASIX) 40 MG tablet Take 40 mg by mouth daily.  12/26/19  Yes [provider]  docusate sodium (COLACE) 100 MG capsule Take 1 capsule (100 mg total) by mouth 2 (two) times daily. Patient not taking: Reported on 09/13/2020 05/28/20   Stegmayer, Ranae Plumber, PA-C    Review of Systems  Constitutional: Positive for fatigue (minimal). Negative for appetite change.  HENT: Negative for congestion, postnasal drip and sore throat.   Eyes: Negative.   Respiratory: Positive for cough (productive cough) and shortness of breath. Negative for wheezing.  Cardiovascular: Negative for chest pain, palpitations and leg swelling.  Gastrointestinal: Negative for abdominal distention and abdominal pain.  Endocrine: Negative.   Genitourinary: Negative.   Musculoskeletal: Positive for arthralgias (sometimes left leg). Negative for back pain.  Skin: Negative.   Allergic/Immunologic: Negative.   Neurological: Positive for numbness (left thigh). Negative for dizziness and light-headedness.  Hematological: Negative for adenopathy. Bruises/bleeds easily.  Psychiatric/Behavioral: Negative for dysphoric mood and sleep disturbance. The patient is not nervous/anxious.     Vitals:   09/13/20 1124  BP: 128/76  Pulse: 82  Resp: 20  SpO2: 94%  Weight: 155 lb 4 oz (70.4 kg)  Height: 5\' 5"  (1.651 m)   Wt Readings from Last 3 Encounters:  09/13/20 155 lb 4 oz (70.4 kg)  09/08/20 162 lb 7.7 oz (73.7 kg)  08/30/20 158 lb (71.7 kg)   Lab Results  Component Value Date   CREATININE 0.71 09/07/2020    CREATININE 1.01 09/06/2020   CREATININE 0.77 07/25/2020    Physical Exam Vitals and nursing note reviewed.  Constitutional:      Appearance: Normal appearance.  HENT:     Head: Normocephalic and atraumatic.  Cardiovascular:     Rate and Rhythm: Normal rate and regular rhythm.  Pulmonary:     Effort: Pulmonary effort is normal.     Breath sounds: Rhonchi (upper lobes) present. No wheezing or rales.  Abdominal:     General: Bowel sounds are normal. There is no distension.     Palpations: Abdomen is soft.  Musculoskeletal:        General: No tenderness.     Cervical back: Normal range of motion and neck supple.     Right lower leg: No edema.     Left lower leg: No edema.  Skin:    General: Skin is warm and dry.  Neurological:     Mental Status: He is alert and oriented to person, place, and time. Mental status is at baseline.  Psychiatric:        Mood and Affect: Mood normal.        Behavior: Behavior normal.        Thought Content: Thought content normal.     Assessment & Plan:  1: Chronic heart failure with preserved ejection fraction- - NYHA class II - euvolemic today - weighing daily; reminded to call for an overnight weight gain of >2 pounds or a weekly weight gain of >5 pounds - not adding salt to his food and has been reading food labels for sodium content - can decrease furosemide to 20mg  daily with additional 20mg  PRN - BNP 09/06/20 was 73.4  2: HTN- - BP looks good today - saw PCP (Tumey) 09/11/20 - BMP 09/07/20 reviewed and showed sodium 141, potassium 4.6, creatinine 0.71 and GFR >60  3: Atrial fibrillation- - saw cardiology 14/9/21) 08/13/20 - currently on plavix    Patient did not bring his medications nor a list. Each medication was verbally reviewed with the patient and he was encouraged to bring the bottles to every visit to confirm accuracy of list.  Due to HF stability, will not make a return appointment for patient at this time. Advised  patient that he could call back at anytime to schedule another appointment. Patient was comfortable with this plan.

## 2020-09-13 ENCOUNTER — Ambulatory Visit: Payer: Medicare Other | Attending: Family | Admitting: Family

## 2020-09-13 ENCOUNTER — Encounter: Payer: Self-pay | Admitting: Family

## 2020-09-13 ENCOUNTER — Other Ambulatory Visit: Payer: Self-pay

## 2020-09-13 VITALS — BP 128/76 | HR 82 | Resp 20 | Ht 65.0 in | Wt 155.2 lb

## 2020-09-13 DIAGNOSIS — Z7982 Long term (current) use of aspirin: Secondary | ICD-10-CM | POA: Insufficient documentation

## 2020-09-13 DIAGNOSIS — I2510 Atherosclerotic heart disease of native coronary artery without angina pectoris: Secondary | ICD-10-CM | POA: Insufficient documentation

## 2020-09-13 DIAGNOSIS — Z7902 Long term (current) use of antithrombotics/antiplatelets: Secondary | ICD-10-CM | POA: Diagnosis not present

## 2020-09-13 DIAGNOSIS — E785 Hyperlipidemia, unspecified: Secondary | ICD-10-CM | POA: Insufficient documentation

## 2020-09-13 DIAGNOSIS — I110 Hypertensive heart disease with heart failure: Secondary | ICD-10-CM | POA: Diagnosis not present

## 2020-09-13 DIAGNOSIS — Z7289 Other problems related to lifestyle: Secondary | ICD-10-CM | POA: Insufficient documentation

## 2020-09-13 DIAGNOSIS — I10 Essential (primary) hypertension: Secondary | ICD-10-CM

## 2020-09-13 DIAGNOSIS — I480 Paroxysmal atrial fibrillation: Secondary | ICD-10-CM

## 2020-09-13 DIAGNOSIS — F32A Depression, unspecified: Secondary | ICD-10-CM | POA: Diagnosis not present

## 2020-09-13 DIAGNOSIS — I5032 Chronic diastolic (congestive) heart failure: Secondary | ICD-10-CM | POA: Diagnosis not present

## 2020-09-13 DIAGNOSIS — J441 Chronic obstructive pulmonary disease with (acute) exacerbation: Secondary | ICD-10-CM | POA: Insufficient documentation

## 2020-09-13 DIAGNOSIS — Z955 Presence of coronary angioplasty implant and graft: Secondary | ICD-10-CM | POA: Insufficient documentation

## 2020-09-13 DIAGNOSIS — Z79899 Other long term (current) drug therapy: Secondary | ICD-10-CM | POA: Insufficient documentation

## 2020-09-13 DIAGNOSIS — Z951 Presence of aortocoronary bypass graft: Secondary | ICD-10-CM | POA: Insufficient documentation

## 2020-09-13 DIAGNOSIS — I4891 Unspecified atrial fibrillation: Secondary | ICD-10-CM | POA: Diagnosis not present

## 2020-09-13 DIAGNOSIS — Z95820 Peripheral vascular angioplasty status with implants and grafts: Secondary | ICD-10-CM | POA: Insufficient documentation

## 2020-09-13 DIAGNOSIS — I252 Old myocardial infarction: Secondary | ICD-10-CM | POA: Insufficient documentation

## 2020-09-13 DIAGNOSIS — I739 Peripheral vascular disease, unspecified: Secondary | ICD-10-CM | POA: Insufficient documentation

## 2020-09-13 DIAGNOSIS — Z87891 Personal history of nicotine dependence: Secondary | ICD-10-CM | POA: Diagnosis not present

## 2020-09-13 DIAGNOSIS — Z7951 Long term (current) use of inhaled steroids: Secondary | ICD-10-CM | POA: Diagnosis not present

## 2020-09-13 NOTE — Patient Instructions (Addendum)
Continue weighing daily and call for an overnight weight gain of > 2 pounds or a weekly weight gain of >5 pounds.   Call us in the future if you'd like to schedule another appointment 

## 2020-09-20 ENCOUNTER — Other Ambulatory Visit (INDEPENDENT_AMBULATORY_CARE_PROVIDER_SITE_OTHER): Payer: Self-pay | Admitting: Nurse Practitioner

## 2020-09-20 DIAGNOSIS — I724 Aneurysm of artery of lower extremity: Secondary | ICD-10-CM

## 2020-09-25 ENCOUNTER — Other Ambulatory Visit: Payer: Self-pay

## 2020-09-25 ENCOUNTER — Ambulatory Visit (INDEPENDENT_AMBULATORY_CARE_PROVIDER_SITE_OTHER): Payer: Medicare Other | Admitting: Vascular Surgery

## 2020-09-25 ENCOUNTER — Ambulatory Visit (INDEPENDENT_AMBULATORY_CARE_PROVIDER_SITE_OTHER): Payer: Medicare Other

## 2020-09-25 ENCOUNTER — Encounter (INDEPENDENT_AMBULATORY_CARE_PROVIDER_SITE_OTHER): Payer: Self-pay | Admitting: Vascular Surgery

## 2020-09-25 VITALS — BP 130/71 | HR 87 | Resp 16 | Ht 65.0 in | Wt 157.0 lb

## 2020-09-25 DIAGNOSIS — I724 Aneurysm of artery of lower extremity: Secondary | ICD-10-CM

## 2020-09-25 DIAGNOSIS — I70229 Atherosclerosis of native arteries of extremities with rest pain, unspecified extremity: Secondary | ICD-10-CM

## 2020-09-25 DIAGNOSIS — I6523 Occlusion and stenosis of bilateral carotid arteries: Secondary | ICD-10-CM

## 2020-09-25 NOTE — Assessment & Plan Note (Signed)
Carotid disease treatment has been put on hold until his lower extremities are completely healed.  This can be rechecked with duplex at his follow-up visit in 2 to 3 months.

## 2020-09-25 NOTE — Assessment & Plan Note (Signed)
Symptoms markedly improved after revascularization.  His left femoral artery repair for pseudoaneurysm following revascularization looks great on duplex today.  Incisions are healed.  Plan to check ABIs in 2 to 3 months and at that point we can discuss his carotid disease.

## 2020-09-25 NOTE — Progress Notes (Signed)
Patient ID: Dennis Preston, male   DOB: 02/10/1951, 69 y.o.   MRN: [id number]  Chief Complaint  Patient presents with  . Follow-up    ultrasound    HPI Dennis Preston is a 69 y.o. male.  Patient returns in follow-up.  He is about 3 to 4 months status post bilateral extensive femoral endarterectomies with concomitant endovascular repair.  He then had a ruptured pseudoaneurysm of his left femoral artery repair found and treated about 2 months ago.  He is done fairly well from this.  His seroma has markedly reduced in size and is now almost nondetectable.  Duplex today shows the pseudoaneurysm to be healed with excellent flow through the common femoral artery and the proximal portions of the branch vessels.  He is doing well without any specific complaints today.  His claudication symptoms are markedly improved after revascularization   Past Medical History:  Diagnosis Date  . Alcohol abuse   . Amaurosis fugax   . Atrial fibrillation (HCC)   . Carotid stenosis   . CHF (congestive heart failure) (HCC)   . Coagulopathy (HCC)   . COPD (chronic obstructive pulmonary disease) (HCC)   . Coronary artery disease   . Depression   . Hyperlipemia   . Hypertension   . Hypotension   . Leucocytosis   . Liver dysfunction   . Myocardial infarction (HCC)    non-STEMI. approx 2016  . Peripheral artery disease (HCC)    legs  . Tobacco abuse   . Wears dentures    full upper and lower    Past Surgical History:  Procedure Laterality Date  . APPLICATION OF WOUND VAC Right 06/22/2020   Procedure: APPLICATION OF WOUND VAC;  Surgeon: Annice Needy, MD;  Location: [facility];  Service: Vascular;  Laterality: Right;  . CARDIAC CATHETERIZATION N/A 07/10/2015   Procedure: Right/Left Heart Cath and Coronary Angiography;  Surgeon: Alwyn Pea, MD;  Location: [facility];  Service: Cardiovascular;  Laterality: N/A;  . CORONARY ARTERY BYPASS GRAFT  07/13/2015   [facility].  4 vessel  .  ENDARTERECTOMY FEMORAL Bilateral 05/23/2020   Procedure: ENDARTERECTOMY FEMORAL BILATERAL SUPERFICIAL FEMORAL ARTERY STENTS ;  Surgeon: Renford Dills, MD;  Location: [facility];  Service: Vascular;  Laterality: Bilateral;  . FALSE ANEURYSM REPAIR Left 07/22/2020   Procedure: REPAIR LEFT FEMORAL PSEUDOANUERYSM;  Surgeon: Bertram Denver, MD;  Location: [facility];  Service: Vascular;  Laterality: Left;  . INSERTION OF ILIAC STENT Left 05/23/2020   Procedure: INSERTION OF ILIAC STENT;  Surgeon: Renford Dills, MD;  Location: [facility];  Service: Vascular;  Laterality: Left;  . LEFT HEART CATH AND CORS/GRAFTS ANGIOGRAPHY N/A 01/23/2020   Procedure: LEFT HEART CATH AND CORS/GRAFTS ANGIOGRAPHY;  Surgeon: Alwyn Pea, MD;  Location: [facility];  Service: Cardiovascular;  Laterality: N/A;  . LOWER EXTREMITY ANGIOGRAPHY Right 04/03/2020   Procedure: LOWER EXTREMITY ANGIOGRAPHY;  Surgeon: Renford Dills, MD;  Location: [facility];  Service: Cardiovascular;  Laterality: Right;  . OLECRANON BURSECTOMY Left 05/06/2019   Procedure: OLECRANON BURSECTOMY AND DEBRIDEMENT;  Surgeon: Signa Kell, MD;  Location: [facility];  Service: Orthopedics;  Laterality: Left;  . WOUND DEBRIDEMENT Right 06/22/2020   Procedure: DEBRIDEMENT WOUND RIGHT GROIN;  Surgeon: Annice Needy, MD;  Location: [facility];  Service: Vascular;  Laterality: Right;      Allergies  Allergen Reactions  . Chlorhexidine     Current Outpatient Medications  Medication  Sig Dispense Refill  . albuterol (PROVENTIL) (2.5 MG/3ML) 0.083% nebulizer solution Take 3 mLs (2.5 mg total) by nebulization every 6 (six) hours as needed for wheezing or shortness of breath. 360 mL 0  . albuterol (VENTOLIN HFA) 108 (90 Base) MCG/ACT inhaler Inhale 1-2 puffs into the lungs every 6 (six) hours as needed for wheezing or shortness of breath. 8 g 0  . aspirin EC 81 MG tablet Take 1 tablet (81 mg total) by mouth daily. Swallow whole. 150 tablet  1  . atorvastatin (LIPITOR) 80 MG tablet Take 1 tablet (80 mg total) by mouth daily at 6 PM.    . clopidogrel (PLAVIX) 75 MG tablet Take 75 mg by mouth every evening.     . Fluticasone-Umeclidin-Vilant (TRELEGY ELLIPTA) 100-62.5-25 MCG/INH AEPB Inhale 1 puff into the lungs every other day.    . furosemide (LASIX) 40 MG tablet Take 40 mg by mouth daily.     Marland Kitchen acetaminophen (TYLENOL) 325 MG tablet Take 650 mg by mouth every 6 (six) hours as needed for mild pain or headache.  (Patient not taking: Reported on 09/25/2020)    . benzonatate (TESSALON) 200 MG capsule Take 200 mg by mouth 3 (three) times daily as needed. (Patient not taking: Reported on 09/25/2020)    . docusate sodium (COLACE) 100 MG capsule Take 1 capsule (100 mg total) by mouth 2 (two) times daily. (Patient not taking: No sig reported) 60 capsule 0   No current facility-administered medications for this visit.        Physical Exam BP 130/71 (BP Location: Right Arm)   Pulse 87   Resp 16   Ht 5\' 5"  (1.651 m)   Wt 157 lb (71.2 kg)   BMI 26.13 kg/m  Gen:  WD/WN, NAD Skin: incision C/D/I.  The marked swelling that was present in the left groin has subsided.  Right groin incision is well-healed.     Assessment/Plan:  Atherosclerosis of artery of extremity with rest pain (HCC) Symptoms markedly improved after revascularization.  His left femoral artery repair for pseudoaneurysm following revascularization looks great on duplex today.  Incisions are healed.  Plan to check ABIs in 2 to 3 months and at that point we can discuss his carotid disease.  Carotid atherosclerosis, bilateral Carotid disease treatment has been put on hold until his lower extremities are completely healed.  This can be rechecked with duplex at his follow-up visit in 2 to 3 months.  Pseudoaneurysm of femoral artery (HCC) Status post successful repair about 2 months ago.      09/25/2020, 11:35 AM   This note was created with Dragon  medical transcription system.  Any errors from dictation are unintentional.

## 2020-09-25 NOTE — Assessment & Plan Note (Signed)
Status post successful repair about 2 months ago.

## 2020-12-18 ENCOUNTER — Other Ambulatory Visit: Payer: Self-pay

## 2020-12-18 ENCOUNTER — Encounter (INDEPENDENT_AMBULATORY_CARE_PROVIDER_SITE_OTHER): Payer: Self-pay | Admitting: Vascular Surgery

## 2020-12-18 ENCOUNTER — Ambulatory Visit (INDEPENDENT_AMBULATORY_CARE_PROVIDER_SITE_OTHER): Payer: Medicare Other

## 2020-12-18 ENCOUNTER — Ambulatory Visit (INDEPENDENT_AMBULATORY_CARE_PROVIDER_SITE_OTHER): Payer: Medicare Other | Admitting: Vascular Surgery

## 2020-12-18 VITALS — BP 146/72 | HR 70 | Resp 16 | Wt 160.8 lb

## 2020-12-18 DIAGNOSIS — I6523 Occlusion and stenosis of bilateral carotid arteries: Secondary | ICD-10-CM

## 2020-12-18 DIAGNOSIS — I70229 Atherosclerosis of native arteries of extremities with rest pain, unspecified extremity: Secondary | ICD-10-CM

## 2020-12-18 DIAGNOSIS — I10 Essential (primary) hypertension: Secondary | ICD-10-CM | POA: Diagnosis not present

## 2020-12-18 DIAGNOSIS — I724 Aneurysm of artery of lower extremity: Secondary | ICD-10-CM | POA: Diagnosis not present

## 2020-12-18 DIAGNOSIS — I739 Peripheral vascular disease, unspecified: Secondary | ICD-10-CM

## 2020-12-18 DIAGNOSIS — E785 Hyperlipidemia, unspecified: Secondary | ICD-10-CM

## 2020-12-18 NOTE — Assessment & Plan Note (Signed)
lipid control important in reducing the progression of atherosclerotic disease. Continue statin therapy  

## 2020-12-18 NOTE — Patient Instructions (Signed)
Carotid Endarterectomy A carotid endarterectomy is a surgery to remove a blockage in the carotid arteries. The carotid arteries are the large blood vessels on both sides of the neck that supply blood to the brain. Carotid artery disease, also called carotid artery stenosis, is the narrowing or blockage of one or both carotid arteries. Carotid artery disease is usually caused by atherosclerosis, which is a buildup of fat and plaque in the arteries. Some buildup of plaque normally occurs with aging. The plaque may partially or totally block blood flow or cause a clot to form in the carotid arteries. This may cause a stroke. Tell a health care provider about:  Any allergies you have.  All medicines you are taking, including vitamins, herbs, eye drops, creams, and over-the-counter medicines.  Any problems you or family members have had with anesthetic medicines.  Any blood disorders you have.  Any surgeries you have had.  Any medical conditions you have, or have had, including diabetes, kidney problems, and infections.  Whether you are pregnant or may be pregnant. What are the risks? Generally, this is a safe procedure. However, problems may occur, including:  Infection.  Bleeding.  Blood clots.  Allergic reactions to medicines.  Damage to nerves near the carotid arteries. This can cause a hoarse voice or weakness of muscles in your face.  Stroke.  Seizures.  Heart attack (myocardial infarction).  Narrowing of the opened blood vessel (restenosis). This may require another surgery. What happens before the procedure? Staying hydrated Follow instructions from your health care provider about hydration, which may include:  Up to 2 hours before the procedure - you may continue to drink clear liquids, such as water, clear fruit juice, black coffee, and plain tea.   Eating and drinking restrictions Follow instructions from your health care provider about eating and drinking, which may  include:  8 hours before the procedure - stop eating heavy meals or foods, such as meat, fried foods, or fatty foods.  6 hours before the procedure - stop eating light meals or foods, such as toast or cereal.  6 hours before the procedure - stop drinking milk or drinks that contain milk.  2 hours before the procedure - stop drinking clear liquids. Medicines  Ask your health care provider about: ? Changing or stopping your regular medicines. This is especially important if you are taking diabetes medicines or blood thinners. ? Taking medicines such as aspirin and ibuprofen. These medicines can thin your blood. Do not take these medicines unless your health care provider tells you to take them. ? Taking over-the-counter medicines, vitamins, herbs, and supplements. General instructions  Do not use any products that contain nicotine or tobacco for at least 4-6 weeks, or as soon as possible, before the procedure. These products include cigarettes, e-cigarettes, and chewing tobacco. If you need help quitting, ask your health care provider.  You may need to have blood tests, a test to check heart rhythm (electrocardiogram), or a test to check blood flow (angiogram).  Plan to have someone take you home from the hospital or clinic.  Ask your health care provider: ? How your surgery site will be marked. ? What steps will be taken to help prevent infection. These may include:  Removing hair at the surgery site.  Washing skin with a germ-killing soap.  Taking antibiotic medicine. What happens during the procedure?  An IV will be inserted into one of your veins.  You will be given one or more of the following: ?  A medicine to help you relax (sedative). ? A medicine to make you fall asleep (general anesthetic).  The surgeon will make a small incision in your neck to expose the carotid artery.  A tube may be inserted into the carotid artery above and below the blockage. This tube will  allow blood to flow around the blockage during the surgery.  An incision will be made in the carotid artery at the location of the blockage.  The blockage will be removed. In some cases, a section of the carotid artery may be removed and a graft patch may be used to repair the artery.  The carotid artery will be closed with stitches (sutures).  If a tube was inserted into the artery to allow blood flow around the blockage during surgery, the tube will be removed. Once the tube is removed, blood flow through the carotid artery will be restored.  The incision in the neck will be closed with sutures.  A bandage (dressing) will be placed over your incision. The procedure may vary among health care providers and hospitals.   What happens after the procedure?  Your blood pressure, heart rate, breathing rate, and blood oxygen level will be monitored until you leave the hospital or clinic.  You may have some pain or an ache in your neck for about 2 weeks. This is normal.  Do not drive for 24 hours if you were given a sedative during your procedure. Summary  A carotid endarterectomy is a surgery to remove a blockage in the carotid arteries.  The carotid arteries are the large blood vessels on both sides of the neck that supply blood to the brain.  Before the procedure, ask your health care provider about changing or stopping your regular medicines.  Follow instructions from your health care provider about eating and drinking before the procedure.  After the procedure, do not drive for 24 hours if you were given a sedative. This information is not intended to replace advice given to you by your health care provider. Make sure you discuss any questions you have with your health care provider. Document Revised: 05/25/2018 Document Reviewed: 05/25/2018 [employer]vier Patient Education  2021 [employer]  

## 2020-12-18 NOTE — Progress Notes (Signed)
MRN : [id number]  Dennis Preston is a 70 y.o. (1951/09/18) male who presents with chief complaint of  Chief Complaint  Patient presents with  . Follow-up    Ultrasound follow up  .  History of Present Illness: Patient returns today in follow up of multiple vascular issues.  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  He had bilateral femoral endarterectomies as well as endovascular intervention.  He had a left femoral pseudoaneurysm that ruptured and had to be treated acutely.  All that is now healed and he is ultimately done okay.  His legs are currently doing reasonably well.  He gets some mild pain in his legs with walking but nothing severe.  His ABIs today have dropped a little on the right and now measure 0.72.  The left ABI remains normal at 1.03. He is also being followed for carotid disease.  His carotid disease was put on hold due to his limb threatening leg issues and subsequent pseudoaneurysm and other issues with his peripheral arterial disease.  These seem to be appropriately dealt with at this point.  He remains asymptomatic in terms of his carotid disease.  No focal neurologic symptoms. Specifically, the patient denies amaurosis fugax, speech or swallowing difficulties, or arm or leg weakness or numbness. Carotid duplex today demonstrates 80 to 99% right ICA stenosis and 60 to 79% left ICA stenosis.  I have also reviewed his CT angiogram from last year and shown this to the patient.  The patient does indeed have high-grade disease of the right internal carotid artery and would benefit from carotid endarterectomy for stroke risk reduction.  His anatomy is not favorable for carotid stenting due to the dense calcific plaque seen in the carotid bifurcation.  Current Outpatient Medications  Medication Sig Dispense Refill  . albuterol (PROVENTIL) (2.5 MG/3ML) 0.083% nebulizer solution Take 3 mLs (2.5 mg total) by  nebulization every 6 (six) hours as needed for wheezing or shortness of breath. 360 mL 0  . albuterol (VENTOLIN HFA) 108 (90 Base) MCG/ACT inhaler Inhale 1-2 puffs into the lungs every 6 (six) hours as needed for wheezing or shortness of breath. 8 g 0  . aspirin EC 81 MG tablet Take 1 tablet (81 mg total) by mouth daily. Swallow whole. 150 tablet 1  . atorvastatin (LIPITOR) 80 MG tablet Take 1 tablet (80 mg total) by mouth daily at 6 PM.    . budesonide (PULMICORT) 0.5 MG/2ML nebulizer solution Inhale into the lungs.    . clopidogrel (PLAVIX) 75 MG tablet Take 75 mg by mouth every evening.     . Fluticasone-Umeclidin-Vilant (TRELEGY ELLIPTA) 100-62.5-25 MCG/INH AEPB Inhale 1 puff into the lungs every other day.    . furosemide (LASIX) 40 MG tablet Take 40 mg by mouth daily.     Marland Kitchen. zolpidem (AMBIEN) 5 MG tablet Take by mouth.    Marland Kitchen. acetaminophen (TYLENOL) 325 MG tablet Take 650 mg by mouth every 6 (six) hours as needed for mild pain or headache.  (Patient not taking: No sig reported)    . benzonatate (TESSALON) 200 MG capsule Take 200 mg by mouth 3 (three) times daily as needed. (Patient not taking: No sig reported)    . docusate sodium (COLACE) 100 MG capsule Take 1 capsule (100 mg total) by mouth 2 (two) times daily. (Patient not taking: No sig reported) 60 capsule 0   No current facility-administered medications for this visit.  Past Medical History:  Diagnosis Date  . Alcohol abuse   . Amaurosis fugax   . Atrial fibrillation (HCC)   . Carotid stenosis   . CHF (congestive heart failure) (HCC)   . Coagulopathy (HCC)   . COPD (chronic obstructive pulmonary disease) (HCC)   . Coronary artery disease   . Depression   . Hyperlipemia   . Hypertension   . Hypotension   . Leucocytosis   . Liver dysfunction   . Myocardial infarction (HCC)    non-STEMI. approx 2016  . Peripheral artery disease (HCC)    legs  . Tobacco abuse   . Wears dentures    full upper and lower    Past Surgical  History:  Procedure Laterality Date  . APPLICATION OF WOUND VAC Right 06/22/2020   Procedure: APPLICATION OF WOUND VAC;  Surgeon: Annice Needy, MD;  Location: [facility];  Service: Vascular;  Laterality: Right;  . CARDIAC CATHETERIZATION N/A 07/10/2015   Procedure: Right/Left Heart Cath and Coronary Angiography;  Surgeon: Alwyn Pea, MD;  Location: [facility];  Service: Cardiovascular;  Laterality: N/A;  . CORONARY ARTERY BYPASS GRAFT  07/13/2015   [facility].  4 vessel  . ENDARTERECTOMY FEMORAL Bilateral 05/23/2020   Procedure: ENDARTERECTOMY FEMORAL BILATERAL SUPERFICIAL FEMORAL ARTERY STENTS ;  Surgeon: Renford Dills, MD;  Location: [facility];  Service: Vascular;  Laterality: Bilateral;  . FALSE ANEURYSM REPAIR Left 07/22/2020   Procedure: REPAIR LEFT FEMORAL PSEUDOANUERYSM;  Surgeon: Bertram Denver, MD;  Location: [facility];  Service: Vascular;  Laterality: Left;  . INSERTION OF ILIAC STENT Left 05/23/2020   Procedure: INSERTION OF ILIAC STENT;  Surgeon: Renford Dills, MD;  Location: [facility];  Service: Vascular;  Laterality: Left;  . LEFT HEART CATH AND CORS/GRAFTS ANGIOGRAPHY N/A 01/23/2020   Procedure: LEFT HEART CATH AND CORS/GRAFTS ANGIOGRAPHY;  Surgeon: Alwyn Pea, MD;  Location: [facility];  Service: Cardiovascular;  Laterality: N/A;  . LOWER EXTREMITY ANGIOGRAPHY Right 04/03/2020   Procedure: LOWER EXTREMITY ANGIOGRAPHY;  Surgeon: Renford Dills, MD;  Location: [facility];  Service: Cardiovascular;  Laterality: Right;  . OLECRANON BURSECTOMY Left 05/06/2019   Procedure: OLECRANON BURSECTOMY AND DEBRIDEMENT;  Surgeon: Signa Kell, MD;  Location: [facility];  Service: Orthopedics;  Laterality: Left;  . WOUND DEBRIDEMENT Right 06/22/2020   Procedure: DEBRIDEMENT WOUND RIGHT GROIN;  Surgeon: Annice Needy, MD;  Location: [facility];  Service: Vascular;  Laterality: Right;     Social History   Tobacco Use  . Smoking status: Former Smoker     Packs/day: 1.00    Years: 54.00    Pack years: 54.00    Types: Cigarettes    Quit date: 03/16/2020    Years since quitting: 0.7  . Smokeless tobacco: Never Used  . Tobacco comment: was 2.5 PPD for 50 yrs until 2016, quit a "few days ago"  Vaping Use  . Vaping Use: Never used  Substance Use Topics  . Alcohol use: Not Currently    Comment: quit over 15 yrs ago  . Drug use: Never     Family History  Problem Relation Age of Onset  . Depression Sister    No bleeding or clotting disorders.  No aneurysms.  Allergies  Allergen Reactions  . Chlorhexidine      REVIEW OF SYSTEMS (Negative unless checked)  Constitutional: [] Weight loss  [] Fever  [] Chills Cardiac: [] Chest pain   [] Chest pressure   [] Palpitations   [] Shortness of  breath when laying flat   [] Shortness of breath at rest   [] Shortness of breath with exertion. Vascular:  [x] Pain in legs with walking   [x] Pain in legs at rest   [] Pain in legs when laying flat   [x] Claudication   [] Pain in feet when walking  [] Pain in feet at rest  [] Pain in feet when laying flat   [] History of DVT   [] Phlebitis   [x] Swelling in legs   [] Varicose veins   [] Non-healing ulcers Pulmonary:   [] Uses home oxygen   [] Productive cough   [] Hemoptysis   [] Wheeze  [] COPD   [] Asthma Neurologic:  [x] Dizziness  [] Blackouts   [] Seizures   [] History of stroke   [] History of TIA  [] Aphasia   [] Temporary blindness   [] Dysphagia   [] Weakness or numbness in arms   [] Weakness or numbness in legs Musculoskeletal:  [x] Arthritis   [] Joint swelling   [] Joint pain   [] Low back pain Hematologic:  [] Easy bruising  [] Easy bleeding   [] Hypercoagulable state   [] Anemic   Gastrointestinal:  [] Blood in stool   [] Vomiting blood  [x] Gastroesophageal reflux/heartburn   [] Abdominal pain Genitourinary:  [] Chronic kidney disease   [] Difficult urination  [] Frequent urination  [] Burning with urination   [] Hematuria Skin:  [] Rashes   [] Ulcers   [] Wounds Psychological:  [] History of  anxiety   []  History of major depression.  Physical Examination  BP (!) 146/72 (BP Location: Right Arm)   Pulse 70   Resp 16   Wt 160 lb 12.8 oz (72.9 kg)   BMI 26.76 kg/m  Gen:  WD/WN, NAD Head: [place]/AT, No temporalis wasting. Ear/Nose/Throat: Hearing grossly intact, nares w/o erythema or drainage Eyes: Conjunctiva clear. Sclera non-icteric Neck: Supple.  Trachea midline Pulmonary:  Good air movement, no use of accessory muscles.  Cardiac: irregular Vascular:  Vessel Right Left  Radial Palpable Palpable                          PT 1+ Palpable 2+ Palpable  DP 1+ Palpable 2+ Palpable   Gastrointestinal: soft, non-tender/non-distended. No guarding/reflex.  Musculoskeletal: M/S 5/5 throughout.  No deformity or atrophy. Mild LLE edema. Neurologic: Sensation grossly intact in extremities.  Symmetrical.  Speech is fluent.  Psychiatric: Judgment intact, Mood & affect appropriate for pt's clinical situation. Dermatologic: No rashes or ulcers noted.  No cellulitis or open wounds.       Labs No results found for this or any previous visit (from the past 2160 hour(s)).  Radiology No results found.  Assessment/Plan  PAD (peripheral artery disease) (HCC) His ABIs today have dropped a little on the right and now measure 0.72.  The left ABI remains normal at 1.03.  His symptoms remain quite mild and at this point we are going to have to address his carotid disease, so we will continue to monitor this for now.  Recheck in 6 months.  Carotid atherosclerosis, bilateral Carotid duplex today demonstrates 80 to 99% right ICA stenosis and 60 to 79% left ICA stenosis.  I have also reviewed his CT angiogram from last year and shown this to the patient.  The patient does indeed have high-grade disease of the right internal carotid artery and would benefit from carotid endarterectomy for stroke risk reduction.  His anatomy is not favorable for carotid stenting due to the dense calcific plaque  seen in the carotid bifurcation.  Risks and benefits of carotid endarterectomy were discussed in detail with the patient.  He  is agreeable to proceed.  Pseudoaneurysm of femoral artery (HCC) Status post repair for bleeding.  Well-healed now.  Essential hypertension blood pressure control important in reducing the progression of atherosclerotic disease. On appropriate oral medications.   Hyperlipidemia lipid control important in reducing the progression of atherosclerotic disease. Continue statin therapy     [name], MD  12/18/2020 4:16 PM    This note was created with Dragon medical transcription system.  Any errors from dictation are purely unintentional

## 2020-12-18 NOTE — Assessment & Plan Note (Signed)
Carotid duplex today demonstrates 80 to 99% right ICA stenosis and 60 to 79% left ICA stenosis.  I have also reviewed his CT angiogram from last year and shown this to the patient.  The patient does indeed have high-grade disease of the right internal carotid artery and would benefit from carotid endarterectomy for stroke risk reduction.  His anatomy is not favorable for carotid stenting due to the dense calcific plaque seen in the carotid bifurcation.  Risks and benefits of carotid endarterectomy were discussed in detail with the patient.  He is agreeable to proceed.

## 2020-12-18 NOTE — Assessment & Plan Note (Signed)
blood pressure control important in reducing the progression of atherosclerotic disease. On appropriate oral medications.  

## 2020-12-18 NOTE — Assessment & Plan Note (Signed)
His ABIs today have dropped a little on the right and now measure 0.72.  The left ABI remains normal at 1.03.  His symptoms remain quite mild and at this point we are going to have to address his carotid disease, so we will continue to monitor this for now.  Recheck in 6 months.

## 2020-12-18 NOTE — H&P (View-Only) (Signed)
  MRN : [id number]  Dennis Preston is a 70 y.o. (08/30/1951) male who presents with chief complaint of  Chief Complaint  Patient presents with  . Follow-up    Ultrasound follow up  .  History of Present Illness: Patient returns today in follow up of multiple vascular issues.  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  He had bilateral femoral endarterectomies as well as endovascular intervention.  He had a left femoral pseudoaneurysm that ruptured and had to be treated acutely.  All that is now healed and he is ultimately done okay.  His legs are currently doing reasonably well.  He gets some mild pain in his legs with walking but nothing severe.  His ABIs today have dropped a little on the right and now measure 0.72.  The left ABI remains normal at 1.03. He is also being followed for carotid disease.  His carotid disease was put on hold due to his limb threatening leg issues and subsequent pseudoaneurysm and other issues with his peripheral arterial disease.  These seem to be appropriately dealt with at this point.  He remains asymptomatic in terms of his carotid disease.  No focal neurologic symptoms. Specifically, the patient denies amaurosis fugax, speech or swallowing difficulties, or arm or leg weakness or numbness. Carotid duplex today demonstrates 80 to 99% right ICA stenosis and 60 to 79% left ICA stenosis.  I have also reviewed his CT angiogram from last year and shown this to the patient.  The patient does indeed have high-grade disease of the right internal carotid artery and would benefit from carotid endarterectomy for stroke risk reduction.  His anatomy is not favorable for carotid stenting due to the dense calcific plaque seen in the carotid bifurcation.  Current Outpatient Medications  Medication Sig Dispense Refill  . albuterol (PROVENTIL) (2.5 MG/3ML) 0.083% nebulizer solution Take 3 mLs (2.5 mg total) by  nebulization every 6 (six) hours as needed for wheezing or shortness of breath. 360 mL 0  . albuterol (VENTOLIN HFA) 108 (90 Base) MCG/ACT inhaler Inhale 1-2 puffs into the lungs every 6 (six) hours as needed for wheezing or shortness of breath. 8 g 0  . aspirin EC 81 MG tablet Take 1 tablet (81 mg total) by mouth daily. Swallow whole. 150 tablet 1  . atorvastatin (LIPITOR) 80 MG tablet Take 1 tablet (80 mg total) by mouth daily at 6 PM.    . budesonide (PULMICORT) 0.5 MG/2ML nebulizer solution Inhale into the lungs.    . clopidogrel (PLAVIX) 75 MG tablet Take 75 mg by mouth every evening.     . Fluticasone-Umeclidin-Vilant (TRELEGY ELLIPTA) 100-62.5-25 MCG/INH AEPB Inhale 1 puff into the lungs every other day.    . furosemide (LASIX) 40 MG tablet Take 40 mg by mouth daily.     . zolpidem (AMBIEN) 5 MG tablet Take by mouth.    . acetaminophen (TYLENOL) 325 MG tablet Take 650 mg by mouth every 6 (six) hours as needed for mild pain or headache.  (Patient not taking: No sig reported)    . benzonatate (TESSALON) 200 MG capsule Take 200 mg by mouth 3 (three) times daily as needed. (Patient not taking: No sig reported)    . docusate sodium (COLACE) 100 MG capsule Take 1 capsule (100 mg total) by mouth 2 (two) times daily. (Patient not taking: No sig reported) 60 capsule 0   No current facility-administered medications for this visit.      Past Medical History:  Diagnosis Date  . Alcohol abuse   . Amaurosis fugax   . Atrial fibrillation (HCC)   . Carotid stenosis   . CHF (congestive heart failure) (HCC)   . Coagulopathy (HCC)   . COPD (chronic obstructive pulmonary disease) (HCC)   . Coronary artery disease   . Depression   . Hyperlipemia   . Hypertension   . Hypotension   . Leucocytosis   . Liver dysfunction   . Myocardial infarction (HCC)    non-STEMI. approx 2016  . Peripheral artery disease (HCC)    legs  . Tobacco abuse   . Wears dentures    full upper and lower    Past Surgical  History:  Procedure Laterality Date  . APPLICATION OF WOUND VAC Right 06/22/2020   Procedure: APPLICATION OF WOUND VAC;  Surgeon: Annice Needy, MD;  Location: [facility];  Service: Vascular;  Laterality: Right;  . CARDIAC CATHETERIZATION N/A 07/10/2015   Procedure: Right/Left Heart Cath and Coronary Angiography;  Surgeon: Alwyn Pea, MD;  Location: [facility];  Service: Cardiovascular;  Laterality: N/A;  . CORONARY ARTERY BYPASS GRAFT  07/13/2015   [facility].  4 vessel  . ENDARTERECTOMY FEMORAL Bilateral 05/23/2020   Procedure: ENDARTERECTOMY FEMORAL BILATERAL SUPERFICIAL FEMORAL ARTERY STENTS ;  Surgeon: Renford Dills, MD;  Location: [facility];  Service: Vascular;  Laterality: Bilateral;  . FALSE ANEURYSM REPAIR Left 07/22/2020   Procedure: REPAIR LEFT FEMORAL PSEUDOANUERYSM;  Surgeon: Bertram Denver, MD;  Location: [facility];  Service: Vascular;  Laterality: Left;  . INSERTION OF ILIAC STENT Left 05/23/2020   Procedure: INSERTION OF ILIAC STENT;  Surgeon: Renford Dills, MD;  Location: [facility];  Service: Vascular;  Laterality: Left;  . LEFT HEART CATH AND CORS/GRAFTS ANGIOGRAPHY N/A 01/23/2020   Procedure: LEFT HEART CATH AND CORS/GRAFTS ANGIOGRAPHY;  Surgeon: Alwyn Pea, MD;  Location: [facility];  Service: Cardiovascular;  Laterality: N/A;  . LOWER EXTREMITY ANGIOGRAPHY Right 04/03/2020   Procedure: LOWER EXTREMITY ANGIOGRAPHY;  Surgeon: Renford Dills, MD;  Location: [facility];  Service: Cardiovascular;  Laterality: Right;  . OLECRANON BURSECTOMY Left 05/06/2019   Procedure: OLECRANON BURSECTOMY AND DEBRIDEMENT;  Surgeon: Signa Kell, MD;  Location: [facility];  Service: Orthopedics;  Laterality: Left;  . WOUND DEBRIDEMENT Right 06/22/2020   Procedure: DEBRIDEMENT WOUND RIGHT GROIN;  Surgeon: Annice Needy, MD;  Location: [facility];  Service: Vascular;  Laterality: Right;     Social History   Tobacco Use  . Smoking status: Former Smoker     Packs/day: 1.00    Years: 54.00    Pack years: 54.00    Types: Cigarettes    Quit date: 03/16/2020    Years since quitting: 0.7  . Smokeless tobacco: Never Used  . Tobacco comment: was 2.5 PPD for 50 yrs until 2016, quit a "few days ago"  Vaping Use  . Vaping Use: Never used  Substance Use Topics  . Alcohol use: Not Currently    Comment: quit over 15 yrs ago  . Drug use: Never     Family History  Problem Relation Age of Onset  . Depression Sister    No bleeding or clotting disorders.  No aneurysms.  Allergies  Allergen Reactions  . Chlorhexidine      REVIEW OF SYSTEMS (Negative unless checked)  Constitutional: [] Weight loss  [] Fever  [] Chills Cardiac: [] Chest pain   [] Chest pressure   [] Palpitations   [] Shortness of  breath when laying flat   [] Shortness of breath at rest   [] Shortness of breath with exertion. Vascular:  [x] Pain in legs with walking   [x] Pain in legs at rest   [] Pain in legs when laying flat   [x] Claudication   [] Pain in feet when walking  [] Pain in feet at rest  [] Pain in feet when laying flat   [] History of DVT   [] Phlebitis   [x] Swelling in legs   [] Varicose veins   [] Non-healing ulcers Pulmonary:   [] Uses home oxygen   [] Productive cough   [] Hemoptysis   [] Wheeze  [] COPD   [] Asthma Neurologic:  [x] Dizziness  [] Blackouts   [] Seizures   [] History of stroke   [] History of TIA  [] Aphasia   [] Temporary blindness   [] Dysphagia   [] Weakness or numbness in arms   [] Weakness or numbness in legs Musculoskeletal:  [x] Arthritis   [] Joint swelling   [] Joint pain   [] Low back pain Hematologic:  [] Easy bruising  [] Easy bleeding   [] Hypercoagulable state   [] Anemic   Gastrointestinal:  [] Blood in stool   [] Vomiting blood  [x] Gastroesophageal reflux/heartburn   [] Abdominal pain Genitourinary:  [] Chronic kidney disease   [] Difficult urination  [] Frequent urination  [] Burning with urination   [] Hematuria Skin:  [] Rashes   [] Ulcers   [] Wounds Psychological:  [] History of  anxiety   []  History of major depression.  Physical Examination  BP (!) 146/72 (BP Location: Right Arm)   Pulse 70   Resp 16   Wt 160 lb 12.8 oz (72.9 kg)   BMI 26.76 kg/m  Gen:  WD/WN, NAD Head: [place]/AT, No temporalis wasting. Ear/Nose/Throat: Hearing grossly intact, nares w/o erythema or drainage Eyes: Conjunctiva clear. Sclera non-icteric Neck: Supple.  Trachea midline Pulmonary:  Good air movement, no use of accessory muscles.  Cardiac: irregular Vascular:  Vessel Right Left  Radial Palpable Palpable                          PT 1+ Palpable 2+ Palpable  DP 1+ Palpable 2+ Palpable   Gastrointestinal: soft, non-tender/non-distended. No guarding/reflex.  Musculoskeletal: M/S 5/5 throughout.  No deformity or atrophy. Mild LLE edema. Neurologic: Sensation grossly intact in extremities.  Symmetrical.  Speech is fluent.  Psychiatric: Judgment intact, Mood & affect appropriate for pt's clinical situation. Dermatologic: No rashes or ulcers noted.  No cellulitis or open wounds.       Labs No results found for this or any previous visit (from the past 2160 hour(s)).  Radiology No results found.  Assessment/Plan  PAD (peripheral artery disease) (HCC) His ABIs today have dropped a little on the right and now measure 0.72.  The left ABI remains normal at 1.03.  His symptoms remain quite mild and at this point we are going to have to address his carotid disease, so we will continue to monitor this for now.  Recheck in 6 months.  Carotid atherosclerosis, bilateral Carotid duplex today demonstrates 80 to 99% right ICA stenosis and 60 to 79% left ICA stenosis.  I have also reviewed his CT angiogram from last year and shown this to the patient.  The patient does indeed have high-grade disease of the right internal carotid artery and would benefit from carotid endarterectomy for stroke risk reduction.  His anatomy is not favorable for carotid stenting due to the dense calcific plaque  seen in the carotid bifurcation.  Risks and benefits of carotid endarterectomy were discussed in detail with the patient.  He  is agreeable to proceed.  Pseudoaneurysm of femoral artery (HCC) Status post repair for bleeding.  Well-healed now.  Essential hypertension blood pressure control important in reducing the progression of atherosclerotic disease. On appropriate oral medications.   Hyperlipidemia lipid control important in reducing the progression of atherosclerotic disease. Continue statin therapy     [name], MD  12/18/2020 4:16 PM    This note was created with Dragon medical transcription system.  Any errors from dictation are purely unintentional

## 2020-12-18 NOTE — Assessment & Plan Note (Signed)
Status post repair for bleeding.  Well-healed now.

## 2020-12-25 ENCOUNTER — Other Ambulatory Visit (INDEPENDENT_AMBULATORY_CARE_PROVIDER_SITE_OTHER): Payer: Self-pay | Admitting: Nurse Practitioner

## 2020-12-25 ENCOUNTER — Telehealth (INDEPENDENT_AMBULATORY_CARE_PROVIDER_SITE_OTHER): Payer: Self-pay

## 2020-12-25 NOTE — Telephone Encounter (Signed)
Spoke with the patient and spouse about his right carotid endarterectomy surgery that is scheduled with Dr. Wyn Quaker on 01/02/21 at [facility]. Pre-op phone call on 12/26/20 between 1-5 pm and covid testing on 12/31/20 between 8-2 pm at the [facility]. Pre-surgical instructions were discussed and will be mailed.

## 2020-12-26 ENCOUNTER — Encounter
Admission: RE | Admit: 2020-12-26 | Discharge: 2020-12-26 | Disposition: A | Discharge status: Home / Self Care | Payer: Medicare Other | Source: Ambulatory Visit | Attending: Vascular Surgery | Admitting: Vascular Surgery

## 2020-12-26 ENCOUNTER — Other Ambulatory Visit: Payer: Self-pay

## 2020-12-26 NOTE — Patient Instructions (Signed)
Your procedure is scheduled on: 01/02/21 Report to ADMITTING DESK IN THE MEDICAL MALL AT TIME GIVEN BY DR DEW'S OFFICE . OR YOU MAY CALL SPECIAL PROCEDURES AT [phone].  Remember: Instructions that are not followed completely may result in serious medical risk, up to and including death, or upon the discretion of your surgeon and anesthesiologist your surgery may need to be rescheduled.     _X__ 1. Do not eat food OR drink liquids after midnight the night before your procedure.                 No gum chewing or hard candies.   __X__2.  On the morning of surgery brush your teeth with toothpaste and water, you                 may rinse your mouth with mouthwash if you wish.  Do not swallow any              toothpaste of mouthwash.     _X__ 3.  No Alcohol for 24 hours before or after surgery.   _X__ 4.  Do Not Smoke or use e-cigarettes For 24 Hours Prior to Your Surgery.                 Do not use any chewable tobacco products for at least 6 hours prior to                 surgery.  ____  5.  Bring all medications with you on the day of surgery if instructed.   __X__  6.  Notify your doctor if there is any change in your medical condition      (cold, fever, infections).     Do not wear jewelry, make-up, hairpins, clips or nail polish. Do not wear lotions, powders, deodorants or perfumes.  Do not shave 48 hours prior to surgery. Men may shave face and neck. Do not bring valuables to the hospital.    [facility] is not responsible for any belongings or valuables.  Contacts, dentures/partials or body piercings may not be worn into surgery. Bring a case for your contacts, glasses or hearing aids, a denture cup will be supplied. Leave your suitcase in the car. After surgery it may be brought to your room. For patients admitted to the hospital, discharge time is determined by your treatment team.   Patients discharged the day of surgery will not be allowed to drive home.   Please read  over the following fact sheets that you were given:    __X__ Take these medicines the morning of surgery with A SIP OF WATER:    1. none  2.   3.   4.  5.  6.  ____ Fleet Enema (as directed)   __X__ Use CHG Soap/SAGE wipes as directed  __X__ Use inhalers on the day of surgery ALSO USE YOUR NEBULIZER  ____ Stop metformin/Janumet/Farxiga 2 days prior to surgery    ____ Take 1/2 of usual insulin dose the night before surgery. No insulin the morning          of surgery.   ____ Stop Blood Thinners Coumadin/Plavix/Xarelto/Pleta/Pradaxa/Eliquis/Effient/Aspirin  on   Or contact your Surgeon, Cardiologist or Medical Doctor regarding  ability to stop your blood thinners  __X__ Stop Anti-inflammatories 7 days before surgery such as Advil, Ibuprofen, Motrin,  BC or Goodies Powder, Naprosyn, Naproxen, Aleve   __X__ Stop all herbal supplements, fish oil or vitamin E until after surgery.  ____ Bring C-Pap to the hospital.

## 2020-12-27 ENCOUNTER — Other Ambulatory Visit
Admission: RE | Admit: 2020-12-27 | Discharge: 2020-12-27 | Disposition: A | Discharge status: Home / Self Care | Payer: Medicare Other | Source: Ambulatory Visit | Attending: Vascular Surgery | Admitting: Vascular Surgery

## 2020-12-27 DIAGNOSIS — Z01818 Encounter for other preprocedural examination: Secondary | ICD-10-CM | POA: Diagnosis not present

## 2020-12-27 LAB — CBC WITH DIFFERENTIAL/PLATELET
Abs Immature Granulocytes: 0.02 10*3/uL (ref 0.00–0.07)
Basophils Absolute: 0 10*3/uL (ref 0.0–0.1)
Basophils Relative: 1 %
Eosinophils Absolute: 0.5 10*3/uL (ref 0.0–0.5)
Eosinophils Relative: 6 %
HCT: 47.7 % (ref 39.0–52.0)
Hemoglobin: 15.5 g/dL (ref 13.0–17.0)
Immature Granulocytes: 0 %
Lymphocytes Relative: 17 %
Lymphs Abs: 1.4 10*3/uL (ref 0.7–4.0)
MCH: 29.4 pg (ref 26.0–34.0)
MCHC: 32.5 g/dL (ref 30.0–36.0)
MCV: 90.3 fL (ref 80.0–100.0)
Monocytes Absolute: 1.1 10*3/uL — ABNORMAL HIGH (ref 0.1–1.0)
Monocytes Relative: 13 %
Neutro Abs: 5.1 10*3/uL (ref 1.7–7.7)
Neutrophils Relative %: 63 %
Platelets: 185 10*3/uL (ref 150–400)
RBC: 5.28 MIL/uL (ref 4.22–5.81)
RDW: 17.4 % — ABNORMAL HIGH (ref 11.5–15.5)
WBC: 8.1 10*3/uL (ref 4.0–10.5)
nRBC: 0 % (ref 0.0–0.2)

## 2020-12-27 LAB — BASIC METABOLIC PANEL
Anion gap: 9 (ref 5–15)
BUN: 18 mg/dL (ref 8–23)
CO2: 32 mmol/L (ref 22–32)
Calcium: 9.4 mg/dL (ref 8.9–10.3)
Chloride: 100 mmol/L (ref 98–111)
Creatinine, Ser: 0.89 mg/dL (ref 0.61–1.24)
GFR, Estimated: >60 mL/min (ref >60)
Glucose, Bld: 103 mg/dL — ABNORMAL HIGH (ref 70–99)
Potassium: 4.1 mmol/L (ref 3.5–5.1)
Sodium: 141 mmol/L (ref 135–145)

## 2020-12-27 LAB — TYPE AND SCREEN
ABO/RH(D): A NEG
Antibody Screen: NEGATIVE

## 2020-12-27 LAB — PROTIME-INR
INR: 1.1 (ref 0.8–1.2)
Prothrombin Time: 14 seconds (ref 11.4–15.2)

## 2020-12-27 LAB — APTT: aPTT: 31 seconds (ref 24–36)

## 2020-12-28 ENCOUNTER — Encounter: Payer: Self-pay | Admitting: Vascular Surgery

## 2020-12-28 NOTE — Progress Notes (Signed)
Perioperative Services  Pre-Admission/Anesthesia Testing Clinical Review  Date: 12/28/20  Patient Demographics:  Name: Dennis Preston DOB:   1950-11-05 MRN:   [id number]  Planned Surgical Procedure(s):    Case: [id number] Date/Time: 01/02/21 1245   Procedure: ENDARTERECTOMY CAROTID (Right )   Anesthesia type: General   Pre-op diagnosis: CAROTID ARTERY STENOSIS   Location: [facility] OR ROOM 07 / [facility] ORS FOR ANESTHESIA GROUP   Surgeons: Annice Needy, MD    NOTE: Available PAT nursing documentation and vital signs have been reviewed. Clinical nursing staff has updated patient's PMH/PSHx, current medication list, and drug allergies/intolerances to ensure comprehensive history available to assist in medical decision making as it pertains to the aforementioned surgical procedure and anticipated anesthetic course.   Clinical Discussion:  Dennis Preston is a 70 y.o. male who is submitted for pre-surgical anesthesia review and clearance prior to him undergoing the above procedure. Patient is a Current Smoker (125 pack years). 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  Patient is followed by cardiology Juliann Pares, MD). He was last seen in the cardiology clinic on 11/07/2020; notes reviewed.  At the time of his clinic visit, patient noted to be doing well overall from a cardiovascular perspective. He denied chest pain, shortness of breath, orthopnea, PND, palpitations, peripheral edema, vertiginous symptoms, and presyncope/syncope. Patient with a PMH significant for cardiovascular disease and intervention as follows:   Patient suffered NSTEMI in 06/2015. Diagnostic heart catheterization revealed significant multivessel CAD.  Patient was ultimately transferred to [facility] where he underwent CABG x 4  (LIMA-LAD, SVG-PDA,  SVG-OM1, and SVG-PL) on 07/13/2015.     Last TTE performed on 01/11/2020 revealed normal left ventricular systolic function with mild LVH; LVEF 50-55%.     Subsequent myocardial perfusion imaging study demonstrated a mildly reduced LV function with inferior hypokinesis; LVEF 49%.  There was evidence of a moderate to large inferior defect with redistribution significant of ischemia and concerning for RVH; high risk study.     Repeat diagnostic cardiac catheterization performed on 01/23/2020 revealed severe multivessel CAD, however medical therapy was recommended.   Patient underwent BILATERAL femoral endarterectomies on 05/23/2020.  He subsequently developed a pseudoaneurysm on the LEFT and went in for repair on 07/22/2020.   Bilateral carotid duplex study performed on 12/18/2020 revealed significant disease the patient's BILATERAL carotid arteries; R>L (see full interpretation of cardiovascular testing and intervention below).    Patient chronically anticoagulated with daily DAPT (ASA + clopidogrel); compliant with therapy with no evidence of GI bleeding. Patient on GDMT for his HTN and HLD diagnoses.  Blood pressure and heart failure symptoms well controlled at well on currently prescribed ARB, ARNI, and diuretic. Patient is on a statin for his HLD. Functional capacity limited by overall debility and advanced COPD.  Patient requires supplemental oxygen (2L/[place]) at night and with distance ambulation.Marland Kitchen  No changes were made to patient's medication regimen.  Patient to follow-up with outpatient cardiology in 6 months or sooner if needed.  Patient is scheduled to undergo a RIGHT carotid endarterectomy on 01/02/2021 with Dr. [name].  Given patient's past medical history significant for cardiovascular disease and intervention, presurgical cardiac clearance was sought by the PAT team.  Per cardiology, "this patient is optimized for surgery and may proceed with the planned procedural course with a  MODERATE risk stratification".  Again, this patient is on daily DAPT  therapy.  He has been instructed on recommendations from his cardiologist for holding both his ASA and clopidogrel for 5 days prior to his procedure with plans to restart 3 days postoperatively, or when postoperative bleeding was felt to be minimized by primary attending surgeon.  Patient is aware that his last dose of his DAPT medications will be taken on 12/27/2020.  Patient denies previous perioperative complications with anesthesia in the past. In review of the available records, it is noted that patient underwent a general anesthetic course here (ASA III) in 06/2020 without documented complications.   Vitals with BMI 12/26/2020 12/18/2020 09/25/2020  Height 5\' 6"  - 5\' 5"   Weight 159 lbs 160 lbs 13 oz 157 lbs  BMI 25.68 - 26.13  Systolic - 146 130  Diastolic - 72 71  Pulse - 70 87  Some encounter information is confidential and restricted. Go to Review Flowsheets activity to see all data.    Providers/Specialists:   NOTE: Primary physician provider listed below. Patient may have been seen by APP or partner within same practice.   PROVIDER ROLE / SPECIALTY LAST , MD Vascular Surgery  12/18/2020  Shanna Cisco, MD Primary Care Provider  11/28/2020  Barbette Reichmann, MD Cardiology  11/07/2020   Allergies:  Chlorhexidine  Current Home Medications:   No current facility-administered medications for this encounter.   Rudean Hitt acetaminophen (TYLENOL) 325 MG tablet  . albuterol (PROVENTIL) (2.5 MG/3ML) 0.083% nebulizer solution  . albuterol (VENTOLIN HFA) 108 (90 Base) MCG/ACT inhaler  . aspirin EC 81 MG tablet  . atorvastatin (LIPITOR) 80 MG tablet  . budesonide (PULMICORT) 0.5 MG/2ML nebulizer solution  . clopidogrel (PLAVIX) 75 MG tablet  . Fluticasone-Umeclidin-Vilant (TRELEGY ELLIPTA) 100-62.5-25 MCG/INH AEPB  . furosemide (LASIX) 40 MG tablet  . zolpidem (AMBIEN) 5 MG tablet   History:   Past  Medical History:  Diagnosis Date  . Alcohol abuse   . Amaurosis fugax   . Aortic atherosclerosis (HCC)   . Atrial fibrillation (HCC)   . Carotid stenosis   . CHF (congestive heart failure) (HCC)   . Coagulopathy (HCC)   . COPD (chronic obstructive pulmonary disease) (HCC)   . Coronary artery disease   . Depression   . 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 Hyperlipemia   . Hypertension   . Leucocytosis   . Liver dysfunction   . Long term current use of clopidogrel   . NSTEMI (non-ST elevated myocardial infarction) (HCC)    approx 2016  . Peripheral artery disease (HCC)    legs  . Tobacco abuse   . Wears dentures    full upper and lower   Past Surgical History:  Procedure Laterality Date  . APPLICATION OF WOUND VAC Right 06/22/2020   Procedure: APPLICATION OF WOUND VAC;  Surgeon: 2017, MD;  Location: [facility] ORS;  Service: Vascular;  Laterality: Right;  . CARDIAC CATHETERIZATION N/A 07/10/2015   Procedure: Right/Left Heart Cath and Coronary Angiography;  Surgeon: Annice Needy, MD;  Location: [facility] INVASIVE CV LAB;  Service: Cardiovascular;  Laterality: N/A;  . CORONARY ARTERY BYPASS GRAFT  07/13/2015   [facility].  4 vessel  . ENDARTERECTOMY FEMORAL Bilateral 05/23/2020   Procedure: ENDARTERECTOMY FEMORAL BILATERAL SUPERFICIAL FEMORAL ARTERY STENTS ;  Surgeon: 07/15/2015, MD;  Location: [facility] ORS;  Service: Vascular;  Laterality: Bilateral;  . FALSE ANEURYSM REPAIR Left 07/22/2020   Procedure: REPAIR LEFT FEMORAL PSEUDOANUERYSM;  Surgeon: Renford Dills, MD;  Location: [facility] ORS;  Service: Vascular;  Laterality: Left;  . INSERTION OF ILIAC STENT Left 05/23/2020   Procedure: INSERTION OF ILIAC STENT;  Surgeon: Renford Dills, MD;  Location: [facility] ORS;  Service: Vascular;  Laterality: Left;  . LEFT HEART CATH AND CORS/GRAFTS ANGIOGRAPHY N/A 01/23/2020   Procedure: LEFT HEART CATH AND CORS/GRAFTS ANGIOGRAPHY;  Surgeon: Alwyn Pea, MD;   Location: [facility] INVASIVE CV LAB;  Service: Cardiovascular;  Laterality: N/A;  . LOWER EXTREMITY ANGIOGRAPHY Right 04/03/2020   Procedure: LOWER EXTREMITY ANGIOGRAPHY;  Surgeon: Renford Dills, MD;  Location: [facility] INVASIVE CV LAB;  Service: Cardiovascular;  Laterality: Right;  . OLECRANON BURSECTOMY Left 05/06/2019   Procedure: OLECRANON BURSECTOMY AND DEBRIDEMENT;  Surgeon: Signa Kell, MD;  Location: [facility] ORS;  Service: Orthopedics;  Laterality: Left;  . WOUND DEBRIDEMENT Right 06/22/2020   Procedure: DEBRIDEMENT WOUND RIGHT GROIN;  Surgeon: Annice Needy, MD;  Location: [facility] ORS;  Service: Vascular;  Laterality: Right;   Family History  Problem Relation Age of Onset  . Depression Sister    Social History   Tobacco Use  . Smoking status: Current Some Day Smoker    Packs/day: 0.25    Years: 54.00    Pack years: 13.50    Types: Cigarettes  . Smokeless tobacco: Never Used  . Tobacco comment: was 2.5 PPD for 50 yrs until 2016, quit a "few days ago"  Vaping Use  . Vaping Use: Never used  Substance Use Topics  . Alcohol use: Not Currently    Comment: quit over 15 yrs ago  . Drug use: Never    Pertinent Clinical Results:  LABS: Labs reviewed: Acceptable for surgery.  Hospital Outpatient Visit on 12/27/2020  Component Date Value Ref Range Status  . WBC 12/27/2020 8.1  4.0 - 10.5 K/uL Final  . RBC 12/27/2020 5.28  4.22 - 5.81 MIL/uL Final  . Hemoglobin 12/27/2020 15.5  13.0 - 17.0 g/dL Final  . HCT 93/26/7124 47.7  39.0 - 52.0 % Final  . MCV 12/27/2020 90.3  80.0 - 100.0 fL Final  . MCH 12/27/2020 29.4  26.0 - 34.0 pg Final  . MCHC 12/27/2020 32.5  30.0 - 36.0 g/dL Final  . RDW 58/05/9832 17.4* 11.5 - 15.5 % Final  . Platelets 12/27/2020 185  150 - 400 K/uL Final  . nRBC 12/27/2020 0.0  0.0 - 0.2 % Final  . Neutrophils Relative % 12/27/2020 63  % Final  . Neutro Abs 12/27/2020 5.1  1.7 - 7.7 K/uL Final  . Lymphocytes Relative 12/27/2020 17  % Final  . Lymphs Abs 12/27/2020 1.4   0.7 - 4.0 K/uL Final  . Monocytes Relative 12/27/2020 13  % Final  . Monocytes Absolute 12/27/2020 1.1* 0.1 - 1.0 K/uL Final  . Eosinophils Relative 12/27/2020 6  % Final  . Eosinophils Absolute 12/27/2020 0.5  0.0 - 0.5 K/uL Final  . Basophils Relative 12/27/2020 1  % Final  . Basophils Absolute 12/27/2020 0.0  0.0 - 0.1 K/uL Final  . Immature Granulocytes 12/27/2020 0  % Final  . Abs Immature Granulocytes 12/27/2020 0.02  0.00 - 0.07 K/uL Final   Performed at [facility], [address] [place], [place] [address]  . Sodium 12/27/2020 141  135 - 145 mmol/L Final  . Potassium 12/27/2020 4.1  3.5 - 5.1 mmol/L Final  . Chloride 12/27/2020 100  98 - 111 mmol/L Final  . CO2 12/27/2020 32  22 - 32 mmol/L Final  . Glucose, Bld 12/27/2020  103* 70 - 99 mg/dL Final   Glucose reference range applies only to samples taken after fasting for at least 8 hours.  . BUN 12/27/2020 18  8 - 23 mg/dL Final  . Creatinine, Ser 12/27/2020 0.89  0.61 - 1.24 mg/dL Final  . Calcium 28/78/6767 9.4  8.9 - 10.3 mg/dL Final  . GFR, Estimated 12/27/2020 >60  >60 mL/min Final   Comment: (NOTE) Calculated using the CKD-EPI Creatinine Equation (2021)   . Anion gap 12/27/2020 9  5 - 15 Final   Performed at [facility], [address] [place], [place] [address]  . Prothrombin Time 12/27/2020 14.0  11.4 - 15.2 seconds Final  . INR 12/27/2020 1.1  0.8 - 1.2 Final   Comment: (NOTE) INR goal varies based on device and disease states. Performed at [facility], [address] [place], [place] [address]   . aPTT 12/27/2020 31  24 - 36 seconds Final   Performed at [facility], [address] [place], [place] [address]  . ABO/RH(D) 12/27/2020 A NEG   Final  . Antibody Screen 12/27/2020 NEG   Final  . Sample Expiration 12/27/2020 01/10/2021,2359   Final  . Extend sample reason 12/27/2020    Final                   Value:NO TRANSFUSIONS OR PREGNANCY IN THE PAST 3  MONTHS Performed at [facility], [address] [place], [place] [address]     ECG: Date: 12/27/2020 Time ECG obtained: 1458 PM Rate: 72 bpm Rhythm: Sinus rhythm with PACs; IRBBB Axis (leads I and aVF): Right axis deviation Intervals: PR 136 ms. QRS 104 ms. QTc 459 ms. ST segment and T wave changes: No evidence of acute ST segment elevation or depression Comparison: Similar to previous tracing obtained on 09/06/2020   IMAGING / PROCEDURES: VASCULAR ULTRASOUND ABI WITH/WITHOUT TBI performed on 12/16/2020 1. Right:   Resting right ankle-brachial index indicates moderate right lower extremity arterial disease.   The right toe-brachial index is abnormal.  2. Left:   Resting left ankle-brachial index is within normal range.   No evidence of significant left lower extremity arterial disease.   The left toe-brachial index is normal.   BILATERAL CAROTID DUPLEX performed on 12/19/2018 1. Right Carotid:   Velocities in the right ICA are consistent with a 80-99% stenosis.   Hemodynamically significant plaque >50% visualized in the CCA.   The ECA appears >50% stenosed.   ICA/CCA ratio = 8.5.  2. Left Carotid:   Velocities in the left ICA are consistent with a 60-79% stenosis.   Non-hemodynamically significant plaque <50% noted in the CCA.   The ECA appears >50% stenosed.   ICA/CCA ratio = 2.70.  3. Vertebrals:   Bilateral vertebral arteries demonstrate antegrade flow.  4. Subclavians:   Right subclavian artery was stenotic.   Normal flow hemodynamics were seen in the left subclavian artery.   LEFT HEART CATHETERIZATION AND CORONARY ANGIOGRAPHY performed on 01/23/2020 1. LVEF 45-50% 2. Severe multivessel CAD  90% stenosis of the LM  90% stenosis of the ostial DM1  90% stenosis of the ramus  75% stenosis of the ostial LCx  50% stenosis proximal RCA  75% stenosis mid RCA  75% stenosis distal RCA-1  80% stenosis distal RCA-2  80% stenosis of  the RPAV-2   90% stenosis of the RPAV-a  90% stenosis of the mid LM  90% stenosis of ostial LAD  100% stenosis origin lesion  Under percent stenosis in the mid LAD 3. Patent grafts: LIMA-LAD, SVG-dRCA, and SVG-ramus 4. Occluded grafts: distal LCx 5. Medical therapy recommended for now    ECHOCARDIOGRAM performed on 06/13/2018 1. LVEF 55 to 60% 2. Left ventricular systolic function normal 3. Left ventricular cavity size and wall thickness normal 4. No evidence of regional wall motion abnormalities 5. Left atrium poorly visualized 6. Right ventricular cavity size mildly dilated 7. Aorta was normal, not dilated, and not diseased 8. Mitral valve with mildly thickened leaflets.  Peak gradient 3 mmHg 9. Aortic valve with mildly thickened leaflets.  No evidence of stenosis 10. No PV regurgitation 11. IVC was dilated.  Respirophasic diameter changes were blunted (less than 50%) consistent with elevated CVP  RIGHT AND LEFT HEART CATHETERIZATION AND CORONARY ANGIOGRAPHY performed on 07/10/2015 1. Multivessel CAD with preserved left ventricular function  90% stenosis LM  75% stenosis ostial LCx  90% stenosis ramus  Percent stenosis mid LAD  90% stenosis ostial DM1  50% stenosis proximal RCA  50% stenosis mid RCA  50% stenosis distal RCA-1  80% stenosis distal RCA-2  80% stenosis post atrio-2  90% stenosis post atrio-1 2. LVEDP normal 3. Recommend coronary bypass surgery as an inpatient    Impression and Plan:  Dennis Preston has been referred for pre-anesthesia review and clearance prior to him undergoing the planned anesthetic and procedural courses. Available labs, pertinent testing, and imaging results were personally reviewed by me. This patient has been appropriately cleared by cardiology with an overall MODERATE risk of significant perioperative cardiovascular complications.  Based on clinical review performed today (12/28/20), barring any significant acute  changes in the patient's overall condition, it is anticipated that he will be able to proceed with the planned surgical intervention. Any acute changes in clinical condition may necessitate his procedure being postponed and/or cancelled. Pre-surgical instructions were reviewed with the patient during his PAT appointment and questions were fielded by PAT clinical staff.  Quentin MullingBryan Virgilio Broadhead, MSN, APRN, FNP-C, CEN [facility]  Peri-operative Services Nurse Practitioner Phone: [phone] 12/28/20 12:06 PM  NOTE: This note has been prepared using Dragon dictation software. Despite my best ability to proofread, there is always the potential that unintentional transcriptional errors may still occur from this process.

## 2020-12-31 ENCOUNTER — Other Ambulatory Visit
Admission: RE | Admit: 2020-12-31 | Discharge: 2020-12-31 | Disposition: A | Discharge status: Home / Self Care | Payer: Medicare Other | Source: Ambulatory Visit | Attending: Vascular Surgery | Admitting: Vascular Surgery

## 2020-12-31 ENCOUNTER — Other Ambulatory Visit: Payer: Self-pay

## 2020-12-31 DIAGNOSIS — Z20822 Contact with and (suspected) exposure to covid-19: Secondary | ICD-10-CM | POA: Insufficient documentation

## 2020-12-31 DIAGNOSIS — Z01812 Encounter for preprocedural laboratory examination: Secondary | ICD-10-CM | POA: Insufficient documentation

## 2020-12-31 LAB — SARS CORONAVIRUS 2 (TAT 6-24 HRS): SARS Coronavirus 2: NEGATIVE

## 2021-01-02 ENCOUNTER — Inpatient Hospital Stay: Payer: Medicare Other | Admitting: Urgent Care

## 2021-01-02 ENCOUNTER — Inpatient Hospital Stay
Admission: RE | Admit: 2021-01-02 | Discharge: 2021-01-03 | DRG: 039 | Disposition: A | Discharge status: Home / Self Care | Payer: Medicare Other | Attending: Vascular Surgery | Admitting: Vascular Surgery

## 2021-01-02 ENCOUNTER — Other Ambulatory Visit: Payer: Self-pay

## 2021-01-02 ENCOUNTER — Encounter: Payer: Self-pay | Admitting: Vascular Surgery

## 2021-01-02 ENCOUNTER — Encounter
Admission: RE | Disposition: A | Discharge status: Home / Self Care | Payer: Self-pay | Source: Home / Self Care | Attending: Vascular Surgery

## 2021-01-02 DIAGNOSIS — I4891 Unspecified atrial fibrillation: Secondary | ICD-10-CM | POA: Diagnosis present

## 2021-01-02 DIAGNOSIS — Z7982 Long term (current) use of aspirin: Secondary | ICD-10-CM | POA: Diagnosis not present

## 2021-01-02 DIAGNOSIS — I252 Old myocardial infarction: Secondary | ICD-10-CM | POA: Diagnosis not present

## 2021-01-02 DIAGNOSIS — E785 Hyperlipidemia, unspecified: Secondary | ICD-10-CM | POA: Diagnosis present

## 2021-01-02 DIAGNOSIS — I2510 Atherosclerotic heart disease of native coronary artery without angina pectoris: Secondary | ICD-10-CM | POA: Diagnosis present

## 2021-01-02 DIAGNOSIS — I70203 Unspecified atherosclerosis of native arteries of extremities, bilateral legs: Secondary | ICD-10-CM | POA: Diagnosis present

## 2021-01-02 DIAGNOSIS — Z7951 Long term (current) use of inhaled steroids: Secondary | ICD-10-CM | POA: Diagnosis not present

## 2021-01-02 DIAGNOSIS — Z79899 Other long term (current) drug therapy: Secondary | ICD-10-CM | POA: Diagnosis not present

## 2021-01-02 DIAGNOSIS — I6521 Occlusion and stenosis of right carotid artery: Secondary | ICD-10-CM | POA: Diagnosis present

## 2021-01-02 DIAGNOSIS — Z20822 Contact with and (suspected) exposure to covid-19: Secondary | ICD-10-CM | POA: Diagnosis present

## 2021-01-02 DIAGNOSIS — I509 Heart failure, unspecified: Secondary | ICD-10-CM | POA: Diagnosis present

## 2021-01-02 DIAGNOSIS — I6523 Occlusion and stenosis of bilateral carotid arteries: Principal | ICD-10-CM | POA: Diagnosis present

## 2021-01-02 DIAGNOSIS — Z951 Presence of aortocoronary bypass graft: Secondary | ICD-10-CM | POA: Diagnosis not present

## 2021-01-02 DIAGNOSIS — Z8679 Personal history of other diseases of the circulatory system: Secondary | ICD-10-CM | POA: Diagnosis not present

## 2021-01-02 DIAGNOSIS — I110 Hypertensive heart disease with heart failure: Secondary | ICD-10-CM | POA: Diagnosis present

## 2021-01-02 DIAGNOSIS — F17210 Nicotine dependence, cigarettes, uncomplicated: Secondary | ICD-10-CM | POA: Diagnosis present

## 2021-01-02 DIAGNOSIS — Z7902 Long term (current) use of antithrombotics/antiplatelets: Secondary | ICD-10-CM | POA: Diagnosis not present

## 2021-01-02 DIAGNOSIS — J449 Chronic obstructive pulmonary disease, unspecified: Secondary | ICD-10-CM | POA: Diagnosis present

## 2021-01-02 HISTORY — DX: Long term (current) use of antithrombotics/antiplatelets: Z79.02

## 2021-01-02 HISTORY — DX: Atherosclerosis of aorta: I70.0

## 2021-01-02 HISTORY — PX: ENDARTERECTOMY: SHX5162

## 2021-01-02 HISTORY — DX: Non-ST elevation (NSTEMI) myocardial infarction: I21.4

## 2021-01-02 LAB — GLUCOSE, CAPILLARY: Glucose-Capillary: 117 mg/dL — ABNORMAL HIGH (ref 70–99)

## 2021-01-02 SURGERY — ENDARTERECTOMY, CAROTID
Anesthesia: General | Site: Neck | Laterality: Right

## 2021-01-02 MED ORDER — FAMOTIDINE 20 MG PO TABS
ORAL_TABLET | ORAL | Status: AC
  Administered 2021-01-02: 20 mg via ORAL
  Filled 2021-01-02: qty 1

## 2021-01-02 MED ORDER — ROCURONIUM BROMIDE 100 MG/10ML IV SOLN
INTRAVENOUS | Status: DC | PRN
  Administered 2021-01-02: 30 mg via INTRAVENOUS
  Administered 2021-01-02: 20 mg via INTRAVENOUS
  Administered 2021-01-02: 50 mg via INTRAVENOUS
  Administered 2021-01-02 (×2): 10 mg via INTRAVENOUS

## 2021-01-02 MED ORDER — FENTANYL CITRATE (PF) 100 MCG/2ML IJ SOLN: 25.0000 ug | INTRAMUSCULAR | Status: DC | PRN

## 2021-01-02 MED ORDER — PHENYLEPHRINE HCL (PRESSORS) 10 MG/ML IV SOLN
INTRAVENOUS | Status: DC | PRN
  Administered 2021-01-02 (×5): 100 ug via INTRAVENOUS
  Administered 2021-01-02: 50 ug via INTRAVENOUS
  Administered 2021-01-02 (×2): 100 ug via INTRAVENOUS

## 2021-01-02 MED ORDER — OXYCODONE HCL 5 MG/5ML PO SOLN: 5.0000 mg | Freq: Once | ORAL | Status: DC | PRN

## 2021-01-02 MED ORDER — FLUTICASONE-UMECLIDIN-VILANT 100-62.5-25 MCG/INH IN AEPB: 1.0000 | INHALATION_SPRAY | RESPIRATORY_TRACT | Status: DC

## 2021-01-02 MED ORDER — LACTATED RINGERS IV SOLN: INTRAVENOUS | Status: DC | PRN

## 2021-01-02 MED ORDER — PROPOFOL 10 MG/ML IV BOLUS
INTRAVENOUS | Status: AC
  Filled 2021-01-02: qty 20

## 2021-01-02 MED ORDER — OXYCODONE-ACETAMINOPHEN 5-325 MG PO TABS
1.0000 | ORAL_TABLET | ORAL | Status: DC | PRN
  Administered 2021-01-02: 2 via ORAL
  Filled 2021-01-02: qty 2

## 2021-01-02 MED ORDER — ASPIRIN EC 81 MG PO TBEC
81.0000 mg | DELAYED_RELEASE_TABLET | Freq: Every day | ORAL | Status: DC
  Administered 2021-01-02 – 2021-01-03 (×2): 81 mg via ORAL
  Filled 2021-01-02 (×2): qty 1

## 2021-01-02 MED ORDER — CLOPIDOGREL BISULFATE 75 MG PO TABS: 75.0000 mg | ORAL_TABLET | Freq: Every evening | ORAL | Status: DC

## 2021-01-02 MED ORDER — ONDANSETRON HCL 4 MG/2ML IJ SOLN: 4.0000 mg | Freq: Four times a day (QID) | INTRAMUSCULAR | Status: DC | PRN

## 2021-01-02 MED ORDER — METOPROLOL TARTRATE 5 MG/5ML IV SOLN: 2.0000 mg | INTRAVENOUS | Status: DC | PRN

## 2021-01-02 MED ORDER — NITROGLYCERIN IN D5W 200-5 MCG/ML-% IV SOLN: 5.0000 ug/min | INTRAVENOUS | Status: DC

## 2021-01-02 MED ORDER — DOCUSATE SODIUM 100 MG PO CAPS
100.0000 mg | ORAL_CAPSULE | Freq: Every day | ORAL | Status: DC
  Administered 2021-01-03: 100 mg via ORAL
  Filled 2021-01-02: qty 1

## 2021-01-02 MED ORDER — ALBUTEROL SULFATE HFA 108 (90 BASE) MCG/ACT IN AERS
1.0000 | INHALATION_SPRAY | Freq: Four times a day (QID) | RESPIRATORY_TRACT | Status: DC | PRN
  Filled 2021-01-02: qty 6.7

## 2021-01-02 MED ORDER — PHENYLEPHRINE HCL (PRESSORS) 10 MG/ML IV SOLN
INTRAVENOUS | Status: AC
  Filled 2021-01-02: qty 1

## 2021-01-02 MED ORDER — ACETAMINOPHEN 10 MG/ML IV SOLN
INTRAVENOUS | Status: AC
  Filled 2021-01-02: qty 100

## 2021-01-02 MED ORDER — GLYCOPYRROLATE 0.2 MG/ML IJ SOLN
INTRAMUSCULAR | Status: AC
  Filled 2021-01-02: qty 1

## 2021-01-02 MED ORDER — PROPOFOL 10 MG/ML IV BOLUS
INTRAVENOUS | Status: DC | PRN
  Administered 2021-01-02: 100 mg via INTRAVENOUS

## 2021-01-02 MED ORDER — CEFAZOLIN SODIUM-DEXTROSE 2-4 GM/100ML-% IV SOLN
2.0000 g | Freq: Three times a day (TID) | INTRAVENOUS | Status: AC
  Administered 2021-01-02: 2 g via INTRAVENOUS
  Filled 2021-01-02 (×2): qty 100

## 2021-01-02 MED ORDER — ACETAMINOPHEN 325 MG PO TABS: 325.0000 mg | ORAL_TABLET | ORAL | Status: DC | PRN

## 2021-01-02 MED ORDER — FLUTICASONE FUROATE-VILANTEROL 100-25 MCG/INH IN AEPB
1.0000 | INHALATION_SPRAY | Freq: Every day | RESPIRATORY_TRACT | Status: DC
  Administered 2021-01-03: 1 via RESPIRATORY_TRACT
  Filled 2021-01-02: qty 28

## 2021-01-02 MED ORDER — SODIUM CHLORIDE 0.9 % IV SOLN
INTRAVENOUS | Status: DC | PRN
  Administered 2021-01-02: 30 ug/min via INTRAVENOUS

## 2021-01-02 MED ORDER — ONDANSETRON HCL 4 MG/2ML IJ SOLN
INTRAMUSCULAR | Status: AC
  Filled 2021-01-02: qty 2

## 2021-01-02 MED ORDER — PROMETHAZINE HCL 25 MG/ML IJ SOLN: 6.2500 mg | INTRAMUSCULAR | Status: DC | PRN

## 2021-01-02 MED ORDER — SODIUM CHLORIDE 0.9 % IV SOLN
INTRAVENOUS | Status: DC | PRN
  Administered 2021-01-02: 30 mL via INTRAMUSCULAR

## 2021-01-02 MED ORDER — CEFAZOLIN SODIUM-DEXTROSE 2-4 GM/100ML-% IV SOLN
INTRAVENOUS | Status: AC
  Administered 2021-01-03: 2 g via INTRAVENOUS
  Filled 2021-01-02: qty 100

## 2021-01-02 MED ORDER — OXYCODONE HCL 5 MG PO TABS: 5.0000 mg | ORAL_TABLET | Freq: Once | ORAL | Status: DC | PRN

## 2021-01-02 MED ORDER — UMECLIDINIUM BROMIDE 62.5 MCG/INH IN AEPB
1.0000 | INHALATION_SPRAY | Freq: Every day | RESPIRATORY_TRACT | Status: DC
  Administered 2021-01-03: 1 via RESPIRATORY_TRACT
  Filled 2021-01-02: qty 7

## 2021-01-02 MED ORDER — DEXAMETHASONE SODIUM PHOSPHATE 10 MG/ML IJ SOLN
INTRAMUSCULAR | Status: AC
  Filled 2021-01-02: qty 1

## 2021-01-02 MED ORDER — CEFAZOLIN SODIUM-DEXTROSE 2-4 GM/100ML-% IV SOLN
2.0000 g | INTRAVENOUS | Status: AC
  Administered 2021-01-02: 2 g via INTRAVENOUS

## 2021-01-02 MED ORDER — MEPERIDINE HCL 50 MG/ML IJ SOLN: 6.2500 mg | INTRAMUSCULAR | Status: DC | PRN

## 2021-01-02 MED ORDER — MORPHINE SULFATE (PF) 2 MG/ML IV SOLN: 2.0000 mg | INTRAVENOUS | Status: DC | PRN

## 2021-01-02 MED ORDER — FUROSEMIDE 40 MG PO TABS
40.0000 mg | ORAL_TABLET | Freq: Every day | ORAL | Status: DC
  Administered 2021-01-02 – 2021-01-03 (×2): 40 mg via ORAL
  Filled 2021-01-02: qty 2
  Filled 2021-01-02: qty 1
  Filled 2021-01-02: qty 2
  Filled 2021-01-02: qty 1

## 2021-01-02 MED ORDER — BUDESONIDE 0.5 MG/2ML IN SUSP: 0.5000 mg | Freq: Every day | RESPIRATORY_TRACT | Status: DC

## 2021-01-02 MED ORDER — LABETALOL HCL 5 MG/ML IV SOLN: 10.0000 mg | INTRAVENOUS | Status: DC | PRN

## 2021-01-02 MED ORDER — EPHEDRINE SULFATE 50 MG/ML IJ SOLN
INTRAMUSCULAR | Status: DC | PRN
  Administered 2021-01-02: 10 mg via INTRAVENOUS
  Administered 2021-01-02: 5 mg via INTRAVENOUS

## 2021-01-02 MED ORDER — FAMOTIDINE IN NACL 20-0.9 MG/50ML-% IV SOLN
20.0000 mg | Freq: Two times a day (BID) | INTRAVENOUS | Status: DC
  Administered 2021-01-02 – 2021-01-03 (×2): 20 mg via INTRAVENOUS
  Filled 2021-01-02 (×2): qty 50

## 2021-01-02 MED ORDER — ESMOLOL HCL-SODIUM CHLORIDE 2000 MG/100ML IV SOLN
25.0000 ug/kg/min | INTRAVENOUS | Status: DC
  Filled 2021-01-02: qty 100

## 2021-01-02 MED ORDER — SODIUM CHLORIDE 0.9 % IV SOLN: INTRAVENOUS | Status: DC

## 2021-01-02 MED ORDER — HEMOSTATIC AGENTS (NO CHARGE) OPTIME
TOPICAL | Status: DC | PRN
  Administered 2021-01-02: 1 via TOPICAL

## 2021-01-02 MED ORDER — ACETAMINOPHEN 10 MG/ML IV SOLN
INTRAVENOUS | Status: DC | PRN
  Administered 2021-01-02: 1000 mg via INTRAVENOUS

## 2021-01-02 MED ORDER — LIDOCAINE HCL 1 % IJ SOLN
INTRAMUSCULAR | Status: DC | PRN
  Administered 2021-01-02: 10 mL

## 2021-01-02 MED ORDER — POTASSIUM CHLORIDE CRYS ER 20 MEQ PO TBCR: 20.0000 meq | EXTENDED_RELEASE_TABLET | Freq: Every day | ORAL | Status: DC | PRN

## 2021-01-02 MED ORDER — SODIUM CHLORIDE 0.9 % IV SOLN: 500.0000 mL | Freq: Once | INTRAVENOUS | Status: DC | PRN

## 2021-01-02 MED ORDER — MAGNESIUM SULFATE 2 GM/50ML IV SOLN: 2.0000 g | Freq: Every day | INTRAVENOUS | Status: DC | PRN

## 2021-01-02 MED ORDER — FAMOTIDINE 20 MG PO TABS: 20.0000 mg | ORAL_TABLET | Freq: Once | ORAL | Status: AC

## 2021-01-02 MED ORDER — DEXAMETHASONE SODIUM PHOSPHATE 10 MG/ML IJ SOLN
INTRAMUSCULAR | Status: DC | PRN
  Administered 2021-01-02: 10 mg via INTRAVENOUS

## 2021-01-02 MED ORDER — ALUM & MAG HYDROXIDE-SIMETH 200-200-20 MG/5ML PO SUSP: 15.0000 mL | ORAL | Status: DC | PRN

## 2021-01-02 MED ORDER — MIDAZOLAM HCL 2 MG/2ML IJ SOLN
INTRAMUSCULAR | Status: DC | PRN
  Administered 2021-01-02: 2 mg via INTRAVENOUS

## 2021-01-02 MED ORDER — FENTANYL CITRATE (PF) 100 MCG/2ML IJ SOLN
INTRAMUSCULAR | Status: DC | PRN
  Administered 2021-01-02 (×2): 50 ug via INTRAVENOUS

## 2021-01-02 MED ORDER — FENTANYL CITRATE (PF) 100 MCG/2ML IJ SOLN
INTRAMUSCULAR | Status: AC
  Filled 2021-01-02: qty 2

## 2021-01-02 MED ORDER — HEPARIN SODIUM (PORCINE) 1000 UNIT/ML IJ SOLN
INTRAMUSCULAR | Status: DC | PRN
  Administered 2021-01-02: 6000 [IU] via INTRAVENOUS

## 2021-01-02 MED ORDER — GLYCOPYRROLATE 0.2 MG/ML IJ SOLN
INTRAMUSCULAR | Status: DC | PRN
  Administered 2021-01-02 (×2): .1 mg via INTRAVENOUS

## 2021-01-02 MED ORDER — "VISTASEAL 4 ML SINGLE DOSE KIT "
PACK | CUTANEOUS | Status: DC | PRN
  Administered 2021-01-02: 4 mL via TOPICAL

## 2021-01-02 MED ORDER — PHENOL 1.4 % MT LIQD
1.0000 | OROMUCOSAL | Status: DC | PRN
  Administered 2021-01-03: 1 via OROMUCOSAL
  Filled 2021-01-02 (×2): qty 177

## 2021-01-02 MED ORDER — ONDANSETRON HCL 4 MG/2ML IJ SOLN
4.0000 mg | Freq: Four times a day (QID) | INTRAMUSCULAR | Status: DC | PRN
  Administered 2021-01-02: 4 mg via INTRAVENOUS

## 2021-01-02 MED ORDER — HEPARIN SODIUM (PORCINE) 1000 UNIT/ML IJ SOLN
INTRAMUSCULAR | Status: AC
  Filled 2021-01-02: qty 1

## 2021-01-02 MED ORDER — ASPIRIN EC 81 MG PO TBEC: 81.0000 mg | DELAYED_RELEASE_TABLET | Freq: Every day | ORAL | Status: DC

## 2021-01-02 MED ORDER — ALBUTEROL SULFATE (2.5 MG/3ML) 0.083% IN NEBU: 2.5000 mg | INHALATION_SOLUTION | Freq: Four times a day (QID) | RESPIRATORY_TRACT | Status: DC | PRN

## 2021-01-02 MED ORDER — GUAIFENESIN-DM 100-10 MG/5ML PO SYRP
15.0000 mL | ORAL_SOLUTION | ORAL | Status: DC | PRN
  Administered 2021-01-03 (×2): 15 mL via ORAL
  Filled 2021-01-02 (×2): qty 15

## 2021-01-02 MED ORDER — ACETAMINOPHEN 325 MG RE SUPP
325.0000 mg | RECTAL | Status: DC | PRN
Start: 2021-01-02 — End: 2021-01-03
  Filled 2021-01-02: qty 2

## 2021-01-02 MED ORDER — NITROGLYCERIN IN D5W 200-5 MCG/ML-% IV SOLN
INTRAVENOUS | Status: AC
  Filled 2021-01-02: qty 250

## 2021-01-02 MED ORDER — LIDOCAINE HCL (CARDIAC) PF 100 MG/5ML IV SOSY
PREFILLED_SYRINGE | INTRAVENOUS | Status: DC | PRN
  Administered 2021-01-02: 80 mg via INTRAVENOUS

## 2021-01-02 MED ORDER — CLOPIDOGREL BISULFATE 75 MG PO TABS
75.0000 mg | ORAL_TABLET | Freq: Every day | ORAL | Status: DC
  Administered 2021-01-03: 75 mg via ORAL
  Filled 2021-01-02: qty 1

## 2021-01-02 MED ORDER — SUGAMMADEX SODIUM 200 MG/2ML IV SOLN
INTRAVENOUS | Status: DC | PRN
  Administered 2021-01-02: 100 mg via INTRAVENOUS
  Administered 2021-01-02: 200 mg via INTRAVENOUS
  Administered 2021-01-02: 100 mg via INTRAVENOUS

## 2021-01-02 MED ORDER — ATORVASTATIN CALCIUM 80 MG PO TABS
80.0000 mg | ORAL_TABLET | Freq: Every day | ORAL | Status: DC
  Administered 2021-01-02: 80 mg via ORAL
  Filled 2021-01-02 (×2): qty 1
  Filled 2021-01-02: qty 4

## 2021-01-02 MED ORDER — HYDROMORPHONE HCL 1 MG/ML IJ SOLN: 1.0000 mg | Freq: Once | INTRAMUSCULAR | Status: DC | PRN

## 2021-01-02 MED ORDER — ORAL CARE MOUTH RINSE
15.0000 mL | Freq: Two times a day (BID) | OROMUCOSAL | Status: DC
  Administered 2021-01-03: 15 mL via OROMUCOSAL

## 2021-01-02 MED ORDER — ROCURONIUM BROMIDE 10 MG/ML (PF) SYRINGE
PREFILLED_SYRINGE | INTRAVENOUS | Status: AC
  Filled 2021-01-02: qty 10

## 2021-01-02 MED ORDER — HYDRALAZINE HCL 20 MG/ML IJ SOLN: 5.0000 mg | INTRAMUSCULAR | Status: DC | PRN

## 2021-01-02 MED ORDER — MIDAZOLAM HCL 2 MG/2ML IJ SOLN
INTRAMUSCULAR | Status: AC
  Filled 2021-01-02: qty 2

## 2021-01-02 MED ORDER — LACTATED RINGERS IV SOLN: INTRAVENOUS | Status: DC

## 2021-01-02 MED ORDER — ACETAMINOPHEN 325 MG PO TABS: 650.0000 mg | ORAL_TABLET | Freq: Four times a day (QID) | ORAL | Status: DC | PRN

## 2021-01-02 SURGICAL SUPPLY — 68 items
ADH SKN CLS APL DERMABOND .7 (GAUZE/BANDAGES/DRESSINGS) ×1
APL PRP STRL LF DISP 70% ISPRP (MISCELLANEOUS)
BAG DECANTER FOR FLEXI CONT (MISCELLANEOUS) ×2 IMPLANT
BASIN GRAD PLASTIC 32OZ STRL (MISCELLANEOUS) ×2 IMPLANT
BLADE SURG 15 STRL LF DISP TIS (BLADE) ×1 IMPLANT
BLADE SURG 15 STRL SS (BLADE) ×2
BLADE SURG SZ11 CARB STEEL (BLADE) ×2 IMPLANT
BOOT SUTURE AID YELLOW STND (SUTURE) ×2 IMPLANT
BRUSH SCRUB EZ  4% CHG (MISCELLANEOUS)
BRUSH SCRUB EZ 4% CHG (MISCELLANEOUS) ×1 IMPLANT
CANISTER SUCT 1200ML W/VALVE (MISCELLANEOUS) ×1 IMPLANT
CHLORAPREP W/TINT 26 (MISCELLANEOUS) ×1 IMPLANT
COVER WAND RF STERILE (DRAPES) ×2 IMPLANT
DERMABOND ADVANCED (GAUZE/BANDAGES/DRESSINGS) ×1
DERMABOND ADVANCED .7 DNX12 (GAUZE/BANDAGES/DRESSINGS) ×1 IMPLANT
DRAPE 3/4 80X56 (DRAPES) ×2 IMPLANT
DRAPE INCISE IOBAN 66X45 STRL (DRAPES) ×2 IMPLANT
DRAPE LAPAROTOMY 77X122 PED (DRAPES) ×2 IMPLANT
DURAPREP 26ML APPLICATOR (WOUND CARE) ×1 IMPLANT
ELECT CAUTERY BLADE 6.4 (BLADE) ×2 IMPLANT
ELECT REM PT RETURN 9FT ADLT (ELECTROSURGICAL) ×2
ELECTRODE REM PT RTRN 9FT ADLT (ELECTROSURGICAL) ×1 IMPLANT
GLOVE SURG ENC MOIS LTX SZ7 (GLOVE) ×2 IMPLANT
GLOVE SURG SYN 7.0 (GLOVE) ×4 IMPLANT
GLOVE SURG SYN 7.0 PF PI (GLOVE) ×2 IMPLANT
GLOVE SURG UNDER LTX SZ7.5 (GLOVE) ×2 IMPLANT
GOWN STRL REUS W/ TWL LRG LVL3 (GOWN DISPOSABLE) ×2 IMPLANT
GOWN STRL REUS W/ TWL XL LVL3 (GOWN DISPOSABLE) ×3 IMPLANT
GOWN STRL REUS W/TWL LRG LVL3 (GOWN DISPOSABLE) ×4
GOWN STRL REUS W/TWL XL LVL3 (GOWN DISPOSABLE) ×6
HEMOSTAT SURGICEL 2X3 (HEMOSTASIS) ×2 IMPLANT
IV NS 250ML (IV SOLUTION) ×2
IV NS 250ML BAXH (IV SOLUTION) ×1 IMPLANT
KIT TURNOVER KIT A (KITS) ×2 IMPLANT
LABEL OR SOLS (LABEL) ×2 IMPLANT
LOOP RED MAXI  1X406MM (MISCELLANEOUS) ×2
LOOP VESSEL MAXI  1X406 RED (MISCELLANEOUS) ×2
LOOP VESSEL MAXI 1X406 RED (MISCELLANEOUS) ×2 IMPLANT
LOOP VESSEL MINI 0.8X406 BLUE (MISCELLANEOUS) ×1 IMPLANT
LOOPS BLUE MINI 0.8X406MM (MISCELLANEOUS) ×1
MANIFOLD NEPTUNE II (INSTRUMENTS) ×2 IMPLANT
NDL FILTER BLUNT 18X1 1/2 (NEEDLE) ×1 IMPLANT
NDL HYPO 25X1 1.5 SAFETY (NEEDLE) ×1 IMPLANT
NEEDLE FILTER BLUNT 18X 1/2SAF (NEEDLE) ×1
NEEDLE FILTER BLUNT 18X1 1/2 (NEEDLE) ×1 IMPLANT
NEEDLE HYPO 25X1 1.5 SAFETY (NEEDLE) ×2 IMPLANT
NS IRRIG 500ML POUR BTL (IV SOLUTION) ×2 IMPLANT
PACK BASIN MAJOR ARMC (MISCELLANEOUS) ×2 IMPLANT
PATCH CAROTID ECM VASC 1X10 (Prosthesis & Implant Heart) ×2 IMPLANT
PENCIL ELECTRO HAND CTR (MISCELLANEOUS) IMPLANT
SHUNT W TPORT 9FR PRUITT F3 (SHUNT) ×2 IMPLANT
SUT MNCRL 4-0 (SUTURE) ×2
SUT MNCRL 4-0 27XMFL (SUTURE) ×1
SUT PROLENE 6 0 BV (SUTURE) ×14 IMPLANT
SUT PROLENE 7 0 BV 1 (SUTURE) ×4 IMPLANT
SUT SILK 2 0 (SUTURE) ×2
SUT SILK 2-0 18XBRD TIE 12 (SUTURE) ×1 IMPLANT
SUT SILK 3 0 (SUTURE) ×2
SUT SILK 3-0 18XBRD TIE 12 (SUTURE) ×1 IMPLANT
SUT SILK 4 0 (SUTURE) ×2
SUT SILK 4-0 18XBRD TIE 12 (SUTURE) ×1 IMPLANT
SUT VIC AB 3-0 SH 27 (SUTURE) ×4
SUT VIC AB 3-0 SH 27X BRD (SUTURE) ×2 IMPLANT
SUTURE MNCRL 4-0 27XMF (SUTURE) ×1 IMPLANT
SYR 10ML LL (SYRINGE) ×4 IMPLANT
SYR 20ML LL LF (SYRINGE) ×2 IMPLANT
TRAY FOLEY MTR SLVR 16FR STAT (SET/KITS/TRAYS/PACK) ×2 IMPLANT
TUBING CONNECTING 10 (TUBING) IMPLANT

## 2021-01-02 NOTE — Interval H&P Note (Signed)
History and Physical Interval Note:  01/02/2021 11:04 AM  Dennis Preston  has presented today for surgery, with the diagnosis of CAROTID ARTERY STENOSIS.  The various methods of treatment have been discussed with the patient and family. After consideration of risks, benefits and other options for treatment, the patient has consented to  Procedure(s): ENDARTERECTOMY CAROTID (Right) as a surgical intervention.  The patient's history has been reviewed, patient examined, no change in status, stable for surgery.  I have reviewed the patient's chart and labs.  Questions were answered to the patient's satisfaction.     [name]

## 2021-01-02 NOTE — Anesthesia Procedure Notes (Addendum)
Procedure Name: Intubation Date/Time: 01/02/2021 12:07 PM Performed by: Jodene Nam, RN Pre-anesthesia Checklist: Patient identified, Emergency Drugs available, Suction available and Patient being monitored Patient Re-evaluated:Patient Re-evaluated prior to induction Oxygen Delivery Method: Circle system utilized Preoxygenation: Pre-oxygenation with 100% oxygen Induction Type: IV induction Ventilation: Mask ventilation without difficulty and Oral airway inserted - appropriate to patient size Laryngoscope Size: McGraph and 4 Grade View: Grade I Tube type: Oral Tube size: 7.5 mm Number of attempts: 1 Airway Equipment and Method: Stylet and Oral airway Placement Confirmation: ETT inserted through vocal cords under direct vision,  positive ETCO2 and breath sounds checked- equal and bilateral Secured at: 22 cm Tube secured with: Tape Dental Injury: Teeth and Oropharynx as per pre-operative assessment

## 2021-01-02 NOTE — Op Note (Addendum)
[facility]   OPERATIVE NOTE  PROCEDURE:   1.  Right carotid endarterectomy with CorMatrix arterial patch reconstruction  PRE-OPERATIVE DIAGNOSIS: 1.  >75% right carotid stenosis   POST-OPERATIVE DIAGNOSIS: same as above   SURGEON: [name], MD  ASSISTANT(S): Levora Dredge, MD and Cleda Daub, PA-C  ANESTHESIA: general  ESTIMATED BLOOD LOSS: 250 cc  FINDING(S): 1.  Right carotid plaque.  SPECIMEN(S):  Carotid plaque (sent to Pathology)  INDICATIONS:   Dennis Preston is a 70 y.o. male who presents with right carotid stenosis of greater than 75%.  I discussed with the patient the risks, benefits, and alternatives to carotid endarterectomy.  I discussed the differences between carotid stenting and carotid endarterectomy. I discussed the procedural details of carotid endarterectomy with the patient.  The patient is aware that the risks of carotid endarterectomy include but are not limited to: bleeding, infection, stroke, myocardial infarction, death, cranial nerve injuries both temporary and permanent, neck hematoma, possible airway compromise, labile blood pressure post-operatively, cerebral hyperperfusion syndrome, and possible need for additional interventions in the future. The patient is aware of the risks and agrees to proceed forward with the procedure. An assistant was present during the procedure to help facilitate the exposure and expedite the procedure.  DESCRIPTION: After full informed written consent was obtained from the patient, the patient was brought back to the operating room and placed supine upon the operating table.  Prior to induction, the patient received IV antibiotics.  After obtaining adequate anesthesia, the patient was placed into a modified beach chair position with a shoulder roll in place and the patient's neck slightly hyperextended and rotated away from the surgical site. The assistant provided retraction and mobilization to  help facilitate exposure and expedite the procedure throughout the entire procedure.  This included following suture, using retractors, and optimizing lighting. The patient was prepped in the standard fashion for a carotid endarterectomy.  I made an incision anterior to the sternocleidomastoid muscle and dissected down through the subcutaneous tissue.  The platysmas was opened with electrocautery.  Then I dissected down to the internal jugular vein and facial vein.  The facial vein is ligated and divided between 2-0 silk ties.  This was dissected posteriorly until I obtained visualization of the common carotid artery.  This was dissected out and then a vessel loop was placed around the common carotid artery.  I then dissected in a periadventitial fashion along the common carotid artery up to the bifurcation.  I then identified the external carotid artery and the superior thyroid artery.  I placed a vessel loop around the superior thyroid artery, and I also dissected out the external carotid artery and placed a vessel loop around it. In the process of this dissection, the hypoglossal nerve was identified and protected from harm.  I then dissected out the internal carotid artery until I identified an area in the internal carotid artery clearly above the stenosis.  I dissected slightly distal to this area, and placed a vessel loop around the artery.  The dissection was quite tedious due to the dense inflammation in the neck, a high bifurcation, and the extent of the disease going well superior.  The hypoglossal nerve had to be freed from the surrounding vessels and slung superiorly to protect it.  At this point, we gave the patient 6000 units of intravenous heparin.  After this was allowed to circulate for several minutes, I pulled up control on the vessel loops to clamp the internal  carotid artery, external carotid artery, superior thyroid artery, and then the common carotid artery.  I then made an arteriotomy in the  common carotid artery with a 11 blade, and extended the arteriotomy with a Potts scissor down into the common carotid artery, then I carried the arteriotomy through the bifurcation into the internal carotid artery until I reached an area that was not diseased.  At this point, I took the Pruitt-In[place] shunt that previously been prepared and I inserted it into the internal carotid artery first, and then into the common carotid artery taking care to flush and de-air prior to release of control. At this point, I started the endarterectomy in the common carotid artery with a Penfield elevator and carried this dissection down into the common carotid artery circumferentially.  Then I transected the plaque at a segment where it was adherent and transected the plaque with Potts scissors.  I then carried this dissection up into the external carotid artery.  The plaque was extracted by unclamping the external carotid artery and performing an eversion endarterectomy.  The dissection was then carried into the internal carotid artery where a nice feathered end point was created with gentle traction.  I passed the plaque off the field as a specimen. At this point I removed all loose flecks and remaining disease possible.  At this point, I was satisfied that the minimal remaining disease was densely adherent to the wall and wall integrity was intact.  I then fashioned a CorMatrix arterial patch for the artery and sewed it in place with two running stitch of 6-0 Prolene.  I started at the distal endpoint and ran one half the length of the arteriotomy.  I then cut and beveled the patch to an appropriate length to match the arteriotomy.  I started the second 6-0 Prolene at the proximal end point.  The medial suture line was completed and the lateral suture line was run approximately one quarter the length of the arteriotomy.  Prior to completing this patch angioplasty, I removed the shunt first from the internal carotid artery, from  which there was excellent backbleeding, and clamped it.  Then I removed the shunt from the common carotid artery, from which there was excellent antegrade bleeding, and then clamped it.  At this point, I allowed the external carotid artery to backbleed, which was excellent.  Then I instilled heparinized saline in this patched artery and then completed the patch angioplasty in the usual fashion.  First, I released the clamp on the external carotid artery, then I released it on the common carotid artery.  After waiting a few seconds, I then released it on the internal carotid artery. Several minutes of pressure were held and 6-0 Prolene patch sutures were used as need for hemostasis.  At this point, I placed Surgicel and Evicel topical hemostatic agents.  There was no more active bleeding in the surgical site.  The sternocleidomastoid space was closed with three interrupted 3-0 Vicryl sutures. I then reapproximated the platysma muscle with a running stitch of 3-0 Vicryl.  The skin was then closed with a running subcuticular 4-0 Monocryl.  The skin was then cleaned, dried and Dermabond was used to reinforce the skin closure.  The patient awakened and was taken to the recovery room in stable condition, following commands and moving all four extremities without any apparent deficits.    COMPLICATIONS: none  CONDITION: stable  [name]  01/02/2021, 2:14 PM    This note was created with [employer]  Medical transcription system. Any errors in dictation are purely unintentional.

## 2021-01-02 NOTE — Anesthesia Preprocedure Evaluation (Signed)
Anesthesia Evaluation  Patient identified by MRN, date of birth, ID band Patient awake    Reviewed: Allergy & Precautions, NPO status , Patient's Chart, lab work & pertinent test results  History of Anesthesia Complications Negative for: history of anesthetic complications  Airway Mallampati: II  TM Distance: >3 FB Neck ROM: Full    Dental  (+) Upper Dentures, Lower Dentures   Pulmonary neg sleep apnea, COPD (occasional home O2 use at night), Current Smoker and Patient abstained from smoking.,    breath sounds clear to auscultation- rhonchi (-) wheezing      Cardiovascular hypertension, Pt. on medications (-) angina+ CAD, + Past MI, + CABG, + Peripheral Vascular Disease and +CHF   Rhythm:Regular Rate:Normal - Systolic murmurs and - Diastolic murmurs L heart cath 01/20/52: Mildly depressed left ventricular function with inferior hypokinesis EF around 45 to 50% Severe multivessel coronary disease including distal left main ostial LAD ostial circumflex Patent grafts LIMA to the LAD SVG to distal RCA SVG to ramus Occluded graft to distal circumflex  Echo 01/11/20 NORMAL LEFT VENTRICULAR SYSTOLIC FUNCTION  WITH MILD LVH  NORMAL RIGHT VENTRICULAR SYSTOLIC FUNCTION  TRIVIAL REGURGITATION NOTED   NO VALVULAR STENOSIS  TRIVIAL MR, TR  EF 50-55%  Mild diastolic dysfunction     Neuro/Psych neg Seizures PSYCHIATRIC DISORDERS Anxiety Depression negative neurological ROS     GI/Hepatic negative GI ROS, Neg liver ROS,   Endo/Other  negative endocrine ROSneg diabetes  Renal/GU negative Renal ROS     Musculoskeletal negative musculoskeletal ROS (+)   Abdominal (+) - obese,   Peds  Hematology negative hematology ROS (+)   Anesthesia Other Findings Past Medical History: No date: Alcohol abuse No date: Amaurosis fugax No date: Aortic atherosclerosis (HCC) No date: Atrial fibrillation (HCC) No date: Carotid stenosis No date:  CHF (congestive heart failure) (HCC) No date: Coagulopathy (HCC) No date: COPD (chronic obstructive pulmonary disease) (HCC) No date: Coronary artery disease No date: Depression 07/13/2015: Hx of CABG     Comment:  LIMA-LAD, SVG-OM1, SVG-PDA, SVG-PL No date: Hyperlipemia No date: Hypertension No date: Leucocytosis No date: Liver dysfunction No date: Long term current use of clopidogrel No date: NSTEMI (non-ST elevated myocardial infarction) (HCC)     Comment:  approx 2016 No date: Peripheral artery disease (HCC)     Comment:  legs No date: Tobacco abuse No date: Wears dentures     Comment:  full upper and lower   Reproductive/Obstetrics                             Anesthesia Physical Anesthesia Plan  ASA: III  Anesthesia Plan: General   Post-op Pain Management:    Induction: Intravenous  PONV Risk Score and Plan: 0 and Ondansetron  Airway Management Planned: Oral ETT  Additional Equipment: Arterial line  Intra-op Plan:   Post-operative Plan: Extubation in OR  Informed Consent: I have reviewed the patients History and Physical, chart, labs and discussed the procedure including the risks, benefits and alternatives for the proposed anesthesia with the patient or authorized representative who has indicated his/her understanding and acceptance.     Dental advisory given  Plan Discussed with: CRNA and Anesthesiologist  Anesthesia Plan Comments:         Anesthesia Quick Evaluation

## 2021-01-02 NOTE — Transfer of Care (Signed)
Immediate Anesthesia Transfer of Care Note  Patient: Dennis Preston  Procedure(s) Performed: ENDARTERECTOMY CAROTID (Right Neck)  Patient Location: PACU  Anesthesia Type:General  Level of Consciousness: awake, alert  and oriented  Airway & Oxygen Therapy: Patient Spontanous Breathing and Patient connected to face mask oxygen  Post-op Assessment: Report given to RN and Post -op Vital signs reviewed and stable  Post vital signs: Reviewed and stable  Last Vitals:  Vitals Value Taken Time  BP 124/62 01/02/21 1445  Temp    Pulse 68 01/02/21 1451  Resp 19 01/02/21 1451  SpO2 98 % 01/02/21 1451  Vitals shown include unvalidated device data.  Last Pain:  Vitals:   01/02/21 1012  TempSrc: Temporal  PainSc: 0-No pain         Complications: No complications documented.

## 2021-01-02 NOTE — Anesthesia Procedure Notes (Addendum)
Arterial Line Insertion Start/End4/02/2021 12:15 PM, 01/02/2021 12:20 PM Performed by: Alver Fisher, MD, Jeanine Luz, CRNA, anesthesiologist  Patient location: OR. Preanesthetic checklist: patient identified, IV checked, site marked, risks and benefits discussed, surgical consent, monitors and equipment checked, pre-op evaluation, timeout performed and anesthesia consent Left, radial was placed Catheter size: 20 Fr Hand hygiene performed  and maximum sterile barriers used   Attempts: 2 Procedure performed using ultrasound guided technique. Ultrasound Notes:anatomy identified, needle tip was noted to be adjacent to the nerve/plexus identified and no ultrasound evidence of intravascular and/or intraneural injection Following insertion, dressing applied. Post procedure assessment: normal and unchanged  Additional procedure comments: Pt allergic to CHG, used alcohol to prep site. Marland Kitchen

## 2021-01-03 ENCOUNTER — Encounter: Payer: Self-pay | Admitting: Vascular Surgery

## 2021-01-03 DIAGNOSIS — I6521 Occlusion and stenosis of right carotid artery: Secondary | ICD-10-CM

## 2021-01-03 LAB — CBC
HCT: 40.5 % (ref 39.0–52.0)
Hemoglobin: 13.2 g/dL (ref 13.0–17.0)
MCH: 29.5 pg (ref 26.0–34.0)
MCHC: 32.6 g/dL (ref 30.0–36.0)
MCV: 90.6 fL (ref 80.0–100.0)
Platelets: 186 10*3/uL (ref 150–400)
RBC: 4.47 MIL/uL (ref 4.22–5.81)
RDW: 17.2 % — ABNORMAL HIGH (ref 11.5–15.5)
WBC: 15 10*3/uL — ABNORMAL HIGH (ref 4.0–10.5)
nRBC: 0 % (ref 0.0–0.2)

## 2021-01-03 LAB — BASIC METABOLIC PANEL
Anion gap: 8 (ref 5–15)
BUN: 17 mg/dL (ref 8–23)
CO2: 28 mmol/L (ref 22–32)
Calcium: 8.3 mg/dL — ABNORMAL LOW (ref 8.9–10.3)
Chloride: 103 mmol/L (ref 98–111)
Creatinine, Ser: 0.86 mg/dL (ref 0.61–1.24)
GFR, Estimated: >60 mL/min (ref >60)
Glucose, Bld: 151 mg/dL — ABNORMAL HIGH (ref 70–99)
Potassium: 4.6 mmol/L (ref 3.5–5.1)
Sodium: 139 mmol/L (ref 135–145)

## 2021-01-03 NOTE — Plan of Care (Signed)
Pt discharged to home.  Endartorectomy site clean and dry, pt is A&O x 4, up in chair at this time, spouse at bedside.  IV sites DCd, bleeding controlled, DC instructions given to patient, understanding verbalized,

## 2021-01-03 NOTE — Plan of Care (Signed)
Care plan review with patient, stated understanding. VSS, PRN pain medication given one time. Pt surgical site slightly swelling, incisions well approximated with attached edges. Pt denies any HA or problem with swallowing. Patient denies any much discomfort. Eating and  and drinking well.  Foley dc'ed last night, voiding without any problem. Art line dc'ed.

## 2021-01-03 NOTE — Discharge Instructions (Signed)
1) you may shower as of tomorrow.  Gently clean your incision with soap and water.  Gently pat dry. 2) please do not lift greater than 10 pounds or engage in strenuous activity until you are cleared at your first postoperative visit. 3) no driving for at least 2 weeks

## 2021-01-03 NOTE — Discharge Summary (Signed)
[facility]    Discharge Summary  Patient ID:  Dennis Preston MRN: [id number] DOB/AGE: [birth year] 70 y.o.  Admit date: 01/02/2021 Discharge date: 01/03/2021 Date of Surgery: 01/02/2021 Surgeon: Surgeon(s): Annice Needy, MD  Admission Diagnosis: Carotid stenosis, right [I65.21]  Discharge Diagnoses:  Carotid stenosis, right [I65.21]  Secondary Diagnoses: Past Medical History:  Diagnosis Date  . Alcohol abuse   . Amaurosis fugax   . Aortic atherosclerosis (HCC)   . Atrial fibrillation (HCC)   . Carotid stenosis   . CHF (congestive heart failure) (HCC)   . Coagulopathy (HCC)   . COPD (chronic obstructive pulmonary disease) (HCC)   . Coronary artery disease   . Depression   . 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 Hyperlipemia   . Hypertension   . Leucocytosis   . Liver dysfunction   . Long term current use of clopidogrel   . NSTEMI (non-ST elevated myocardial infarction) (HCC)    approx 2016  . Peripheral artery disease (HCC)    legs  . Tobacco abuse   . Wears dentures    full upper and lower   Procedure(s): 01/02/21: 1.  Right carotid endarterectomy with CorMatrix arterial patch reconstruction  Discharged Condition: Good  HPI / Hospital Course:  Dennis Preston is a 70 y.o. male who presents with right carotid stenosis of greater than 75%.  I discussed with the patient the risks, benefits, and alternatives to carotid endarterectomy. On 01/02/21, the patient underwent:  1.  Right carotid endarterectomy with CorMatrix arterial patch reconstruction  The patient tolerated the procedure well was transferred from the angiography suite to the ICU for observation overnight. The patient's night of surgery was unremarkable. During his brief inpatient stay, the patient's diet was advanced, was urinating independently at discharge, his pain was controlled to the use of pain medication he was ambulating at baseline. Day of  discharge, the patient was afebrile with stable vital signs and improved physical exam.  Physical Exam:  Alert and oriented x3, no acute distress Face: Symmetrical, tongue midline Neck:  Incision: Clean dry and intact.  Some surrounding ecchymosis noted.  Minimal swelling. Trachea midline Cardiovascular: Regular rate and rhythm Pulmonary: Clear to auscultation bilaterally Abdomen: Soft, nontender, nondistended positive bowel sounds Extremity: Warm distally toes minimal edema Neurological: Intact, no deficits on today's examLabs as below  Complications: None  Consults: None  Significant Diagnostic Studies: CBC Lab Results  Component Value Date   WBC 15.0 (H) 01/03/2021   HGB 13.2 01/03/2021   HCT 40.5 01/03/2021   MCV 90.6 01/03/2021   PLT 186 01/03/2021   BMET    Component Value Date/Time   NA 139 01/03/2021 0448   K 4.6 01/03/2021 0448   CL 103 01/03/2021 0448   CO2 28 01/03/2021 0448   GLUCOSE 151 (H) 01/03/2021 0448   BUN 17 01/03/2021 0448   CREATININE 0.86 01/03/2021 0448   CALCIUM 8.3 (L) 01/03/2021 0448   GFRNONAA >60 01/03/2021 0448   GFRAA >60 06/22/2020 1036   COAG Lab Results  Component Value Date   INR 1.1 12/27/2020   INR 1.1 07/24/2020   INR 1.2 07/23/2020   Disposition:  Discharge to :Home  Allergies as of 01/03/2021      Reactions   Chlorhexidine       Medication List    TAKE these medications   acetaminophen 325 MG tablet Commonly known as: TYLENOL Take 650 mg by mouth every 6 (six) hours  as needed for mild pain or headache.   albuterol 108 (90 Base) MCG/ACT inhaler Commonly known as: VENTOLIN HFA Inhale 1-2 puffs into the lungs every 6 (six) hours as needed for wheezing or shortness of breath.   albuterol (2.5 MG/3ML) 0.083% nebulizer solution Commonly known as: PROVENTIL Take 3 mLs (2.5 mg total) by nebulization every 6 (six) hours as needed for wheezing or shortness of breath.   aspirin EC 81 MG tablet Take 1 tablet (81 mg  total) by mouth daily. Swallow whole. Notes to patient: Start 01/04/2021   atorvastatin 80 MG tablet Commonly known as: LIPITOR Take 1 tablet (80 mg total) by mouth daily at 6 PM. Notes to patient: Start this evening 01/03/2021   budesonide 0.5 MG/2ML nebulizer solution Commonly known as: PULMICORT Inhale into the lungs. Notes to patient: Start tomorrow 01/04/2021   clopidogrel 75 MG tablet Commonly known as: PLAVIX Take 75 mg by mouth every evening. Notes to patient: Start tomorrow 01/04/2021   furosemide 40 MG tablet Commonly known as: LASIX Take 40 mg by mouth daily. Notes to patient: Start tomorrow 01/04/2021   Trelegy Ellipta 100-62.5-25 MCG/INH Aepb Generic drug: Fluticasone-Umeclidin-Vilant Inhale 1 puff into the lungs every other day. Notes to patient: Start Saturday 01/05/2021   zolpidem 5 MG tablet Commonly known as: AMBIEN Take 5 mg by mouth at bedtime as needed for sleep.      Verbal and written Discharge instructions given to the patient. Wound care per Discharge AVS  Follow-up Information    Dew, Marlow Baars, MD Follow up in 1 month(s).   Specialties: Vascular Surgery, Radiology, Interventional Cardiology Why: Can see Dew or Vivia Birmingham.  Will need carotid duplex with visit.  Message has been left with this office to call you and make an appointment Contact information: [address] [place] [address] [phone]              Signed: Tonette Lederer, PA-C 01/03/2021, 12:28 PM

## 2021-01-03 NOTE — Anesthesia Postprocedure Evaluation (Signed)
Anesthesia Post Note  Patient: Dennis Preston  Procedure(s) Performed: ENDARTERECTOMY CAROTID (Right Neck)  Patient location during evaluation: PACU Anesthesia Type: General Level of consciousness: awake and alert Pain management: pain level controlled Vital Signs Assessment: post-procedure vital signs reviewed and stable Respiratory status: spontaneous breathing, nonlabored ventilation and respiratory function stable Cardiovascular status: blood pressure returned to baseline and stable Postop Assessment: no apparent nausea or vomiting Anesthetic complications: no   No complications documented.   Last Vitals:  Vitals:   01/03/21 0600 01/03/21 0700  BP: (!) 130/51 (!) 116/52  Pulse: 73 68  Resp: 20 17  Temp:    SpO2: 92% 92%    Last Pain:  Vitals:   01/03/21 0400  TempSrc: Oral  PainSc: 0-No pain                 Aurelio Brash Jameica Couts

## 2021-01-04 LAB — SURGICAL PATHOLOGY

## 2021-01-07 ENCOUNTER — Telehealth (INDEPENDENT_AMBULATORY_CARE_PROVIDER_SITE_OTHER): Payer: Self-pay | Admitting: Vascular Surgery

## 2021-01-07 NOTE — Telephone Encounter (Signed)
Patient was called and giving the information regarding having pain medications called into pharmacy per Dr. Wyn Quaker. See notes below.

## 2021-01-07 NOTE — Telephone Encounter (Signed)
Called stating that he is in pain and would like an RX sent to his pharmacy (in [place] [phone]) Patient had endarterectomy carotid done 01/02/21 and is scheduled to come in for first post op follow up 02/01/21.

## 2021-01-07 NOTE — Telephone Encounter (Signed)
Please advise 

## 2021-01-16 DIAGNOSIS — Z9889 Other specified postprocedural states: Secondary | ICD-10-CM | POA: Insufficient documentation

## 2021-01-21 ENCOUNTER — Telehealth (INDEPENDENT_AMBULATORY_CARE_PROVIDER_SITE_OTHER): Payer: Self-pay | Admitting: Vascular Surgery

## 2021-01-21 NOTE — Telephone Encounter (Signed)
Pt had a carotid enterectomy a month and wants to know is it ok to shave over the surgical site.

## 2021-01-21 NOTE — Telephone Encounter (Signed)
I would wait to shave over the incision until his post up visit but he can shave close  around it

## 2021-01-22 NOTE — Telephone Encounter (Signed)
I called and left the pt a VM with the NP's instructions and asked that they call back if they have questions.

## 2021-01-31 ENCOUNTER — Other Ambulatory Visit (INDEPENDENT_AMBULATORY_CARE_PROVIDER_SITE_OTHER): Payer: Self-pay | Admitting: Vascular Surgery

## 2021-01-31 DIAGNOSIS — Z9889 Other specified postprocedural states: Secondary | ICD-10-CM

## 2021-01-31 DIAGNOSIS — I6521 Occlusion and stenosis of right carotid artery: Secondary | ICD-10-CM

## 2021-02-01 ENCOUNTER — Ambulatory Visit (INDEPENDENT_AMBULATORY_CARE_PROVIDER_SITE_OTHER): Payer: Medicare Other | Admitting: Nurse Practitioner

## 2021-02-01 ENCOUNTER — Ambulatory Visit (INDEPENDENT_AMBULATORY_CARE_PROVIDER_SITE_OTHER): Payer: Medicare Other

## 2021-02-01 ENCOUNTER — Encounter (INDEPENDENT_AMBULATORY_CARE_PROVIDER_SITE_OTHER): Payer: Self-pay | Admitting: Nurse Practitioner

## 2021-02-01 ENCOUNTER — Encounter (INDEPENDENT_AMBULATORY_CARE_PROVIDER_SITE_OTHER): Payer: Medicare Other

## 2021-02-01 ENCOUNTER — Other Ambulatory Visit: Payer: Self-pay

## 2021-02-01 VITALS — BP 138/81 | HR 59 | Ht 65.0 in | Wt 158.0 lb

## 2021-02-01 DIAGNOSIS — Z9889 Other specified postprocedural states: Secondary | ICD-10-CM | POA: Diagnosis not present

## 2021-02-01 DIAGNOSIS — E785 Hyperlipidemia, unspecified: Secondary | ICD-10-CM

## 2021-02-01 DIAGNOSIS — I6521 Occlusion and stenosis of right carotid artery: Secondary | ICD-10-CM | POA: Diagnosis not present

## 2021-02-01 MED ORDER — TRAMADOL HCL 50 MG PO TABS: 50.0000 mg | ORAL_TABLET | Freq: Four times a day (QID) | ORAL | 0 refills | Status: DC | PRN

## 2021-02-02 NOTE — Progress Notes (Signed)
Subjective:    Patient ID: Dennis Preston, male    DOB: Jul 22, 1951, 70 y.o.   MRN: [id number] Chief Complaint  Patient presents with  . Follow-up  . Carotid    1 Mo F/U endart. Carotid U/S    Dennis Preston is a 70 year old male that presents today for evaluation following a carotid endarterectomy on 01/02/2021.  The patient notes that he is doing well.  He still continues to have some aching during the evening but currently his pain medication makes it tolerable.  He denies any bleeding.  He denies any fever, chills nausea or vomiting following the procedure.  Overall he has done very well.  The wound itself is clean dry and intact with no evidence of dehiscence.  Today noninvasive studies show 1 to 39% stenosis in the right ICA post endarterectomy with a 60 to 79% stenosis in the left carotid.   Review of Systems  Skin: Positive for wound.  All other systems reviewed and are negative.      Objective:   Physical Exam Vitals reviewed.  HENT:     Head: Normocephalic.  Neck:     Vascular: No carotid bruit.  Cardiovascular:     Rate and Rhythm: Normal rate.     Pulses: Normal pulses.  Pulmonary:     Effort: Pulmonary effort is normal.  Skin:    General: Skin is warm and dry.  Neurological:     Mental Status: He is alert and oriented to person, place, and time.  Psychiatric:        Mood and Affect: Mood normal.        Behavior: Behavior normal.        Thought Content: Thought content normal.        Judgment: Judgment normal.     BP 138/81 Comment: Lt ARM  Pulse (!) 59   Ht 5\' 5"  (1.651 m)   Wt 158 lb (71.7 kg)   BMI 26.29 kg/m   Past Medical History:  Diagnosis Date  . Alcohol abuse   . Amaurosis fugax   . Aortic atherosclerosis (HCC)   . Atrial fibrillation (HCC)   . Carotid stenosis   . CHF (congestive heart failure) (HCC)   . Coagulopathy (HCC)   . COPD (chronic obstructive pulmonary disease) (HCC)   . Coronary artery disease   . Depression   . Hx of  CABG 07/13/2015   LIMA-LAD, SVG-OM1, SVG-PDA, SVG-PL  . Hyperlipemia   . Hypertension   . Leucocytosis   . Liver dysfunction   . Long term current use of clopidogrel   . NSTEMI (non-ST elevated myocardial infarction) (HCC)    approx 2016  . Peripheral artery disease (HCC)    legs  . Tobacco abuse   . Wears dentures    full upper and lower    Social History   Socioeconomic History  . Marital status: Married    Spouse name: Not on file  . Number of children: Not on file  . Years of education: Not on file  . Highest education level: Not on file  Occupational History  . Not on file  Tobacco Use  . Smoking status: Current Some Day Smoker    Packs/day: 0.25    Years: 54.00    Pack years: 13.50    Types: Cigarettes  . Smokeless tobacco: Never Used  . Tobacco comment: was 2.5 PPD for 50 yrs until 2016, quit a "few days ago"  Vaping Use  . Vaping Use: Never  used  Substance and Sexual Activity  . Alcohol use: Not Currently    Comment: quit over 15 yrs ago  . Drug use: Never  . Sexual activity: Not on file  Other Topics Concern  . Not on file  Social History Narrative  . Not on file   Social Determinants of Health   Financial Resource Strain: Not on file  Food Insecurity: Not on file  Transportation Needs: Not on file  Physical Activity: Not on file  Stress: Not on file  Social Connections: Not on file  Intimate Partner Violence: Not on file    Past Surgical History:  Procedure Laterality Date  . APPLICATION OF WOUND VAC Right 06/22/2020   Procedure: APPLICATION OF WOUND VAC;  Surgeon: Annice Needy, MD;  Location: [facility];  Service: Vascular;  Laterality: Right;  . CARDIAC CATHETERIZATION N/A 07/10/2015   Procedure: Right/Left Heart Cath and Coronary Angiography;  Surgeon: Alwyn Pea, MD;  Location: [facility];  Service: Cardiovascular;  Laterality: N/A;  . CORONARY ARTERY BYPASS GRAFT  07/13/2015   [facility].  4 vessel  . ENDARTERECTOMY Right  01/02/2021   Procedure: ENDARTERECTOMY CAROTID;  Surgeon: Annice Needy, MD;  Location: [facility];  Service: Vascular;  Laterality: Right;  . ENDARTERECTOMY FEMORAL Bilateral 05/23/2020   Procedure: ENDARTERECTOMY FEMORAL BILATERAL SUPERFICIAL FEMORAL ARTERY STENTS ;  Surgeon: Renford Dills, MD;  Location: [facility];  Service: Vascular;  Laterality: Bilateral;  . FALSE ANEURYSM REPAIR Left 07/22/2020   Procedure: REPAIR LEFT FEMORAL PSEUDOANUERYSM;  Surgeon: Bertram Denver, MD;  Location: [facility];  Service: Vascular;  Laterality: Left;  . INSERTION OF ILIAC STENT Left 05/23/2020   Procedure: INSERTION OF ILIAC STENT;  Surgeon: Renford Dills, MD;  Location: [facility];  Service: Vascular;  Laterality: Left;  . LEFT HEART CATH AND CORS/GRAFTS ANGIOGRAPHY N/A 01/23/2020   Procedure: LEFT HEART CATH AND CORS/GRAFTS ANGIOGRAPHY;  Surgeon: Alwyn Pea, MD;  Location: [facility];  Service: Cardiovascular;  Laterality: N/A;  . LOWER EXTREMITY ANGIOGRAPHY Right 04/03/2020   Procedure: LOWER EXTREMITY ANGIOGRAPHY;  Surgeon: Renford Dills, MD;  Location: [facility];  Service: Cardiovascular;  Laterality: Right;  . OLECRANON BURSECTOMY Left 05/06/2019   Procedure: OLECRANON BURSECTOMY AND DEBRIDEMENT;  Surgeon: Signa Kell, MD;  Location: [facility];  Service: Orthopedics;  Laterality: Left;  . WOUND DEBRIDEMENT Right 06/22/2020   Procedure: DEBRIDEMENT WOUND RIGHT GROIN;  Surgeon: Annice Needy, MD;  Location: [facility];  Service: Vascular;  Laterality: Right;    Family History  Problem Relation Age of Onset  . Depression Sister     Allergies  Allergen Reactions  . Chlorhexidine     CBC Latest Ref Rng & Units 01/03/2021 12/27/2020 09/07/2020  WBC 4.0 - 10.5 K/uL 15.0(H) 8.1 10.7(H)  Hemoglobin 13.0 - 17.0 g/dL 55.7 32.2 02.5  Hematocrit 39.0 - 52.0 % 40.5 47.7 40.9  Platelets 150 - 400 K/uL 186 185 314      CMP     Component Value Date/Time   NA 139 01/03/2021 0448    K 4.6 01/03/2021 0448   CL 103 01/03/2021 0448   CO2 28 01/03/2021 0448   GLUCOSE 151 (H) 01/03/2021 0448   BUN 17 01/03/2021 0448   CREATININE 0.86 01/03/2021 0448   CALCIUM 8.3 (L) 01/03/2021 0448   PROT 5.6 (L) 07/22/2020 2112   ALBUMIN 3.7 07/22/2020 2112   AST 16 07/22/2020 2112   ALT 12 07/22/2020 2112  ALKPHOS 63 07/22/2020 2112   BILITOT 0.5 07/22/2020 2112   GFRNONAA >60 01/03/2021 0448   GFRAA >60 06/22/2020 1036     VAS [place] ABI WITH/WO TBI  Result Date: 12/19/2020 LOWER EXTREMITY DOPPLER STUDY Indications: Peripheral artery disease.  Vascular Interventions: 04/03/20: Right profunda femoral artery PTA;                         05/23/20: Left EIA, bilateral SFA & bilateral popliteal                         artery stents with bilateral CFA/SFA/PFA                         endarterectomies;. Performing Technologist: Salvadore Farber RVT  Examination Guidelines: A complete evaluation includes at minimum, Doppler waveform signals and systolic blood pressure reading at the level of bilateral brachial, anterior tibial, and posterior tibial arteries, when vessel segments are accessible. Bilateral testing is considered an integral part of a complete examination. Photoelectric Plethysmograph (PPG) waveforms and toe systolic pressure readings are included as required and additional duplex testing as needed. Limited examinations for reoccurring indications may be performed as noted.  ABI Findings: +---------+------------------+-----+----------+--------+ Right    Rt Pressure (mmHg)IndexWaveform  Comment  +---------+------------------+-----+----------+--------+ Brachial 143                                       +---------+------------------+-----+----------+--------+ ATA      103               0.72 monophasic         +---------+------------------+-----+----------+--------+ PTA      85                0.59 monophasic         +---------+------------------+-----+----------+--------+  Great Toe66                0.46 Normal             +---------+------------------+-----+----------+--------+ +---------+------------------+-----+--------+-------+ Left     Lt Pressure (mmHg)IndexWaveformComment +---------+------------------+-----+--------+-------+ ATA      141               0.99 biphasic        +---------+------------------+-----+--------+-------+ PTA      148               1.03 biphasic        +---------+------------------+-----+--------+-------+ Great Toe109               0.76 Normal          +---------+------------------+-----+--------+-------+ +-------+-----------+-----------+------------+------------+ ABI/TBIToday's ABIToday's TBIPrevious ABIPrevious TBI +-------+-----------+-----------+------------+------------+ Right  .72        .46        1.15        .70          +-------+-----------+-----------+------------+------------+ Left   1.03       .76        1.18        .75          +-------+-----------+-----------+------------+------------+ Right ABIs and TBIs appear decreased compared to prior study on 07/09/2020.  Summary: Right: Resting right ankle-brachial index indicates moderate right lower extremity arterial disease. The right toe-brachial index is abnormal. Left: Resting left ankle-brachial index is within normal range. No evidence of significant left lower extremity  arterial disease. The left toe-brachial index is normal.  *See table(s) above for measurements and observations.  Electronically signed by Festus BarrenJason Dew MD on 12/19/2020 at 3:00:08 PM.    Final        Assessment & Plan:   1. S/P carotid endarterectomy Overall the patient is doing well post carotid endarterectomy.  His wound is healing well and his ultrasound notes the area is patent.  He still continues to have some aching discomfort that is mainly worse at night.  We will authorize a refill of his pain medication.  Otherwise we will have patient return in 3 months with  noninvasive studies. - traMADol (ULTRAM) 50 MG tablet; Take 1 tablet (50 mg total) by mouth every 6 (six) hours as needed.  Dispense: 30 tablet; Refill: 0  2. Hyperlipidemia, unspecified hyperlipidemia type Continue statin as ordered and reviewed, no changes at this time    Current Outpatient Medications on File Prior to Visit  Medication Sig Dispense Refill  . acetaminophen (TYLENOL) 325 MG tablet Take 650 mg by mouth every 6 (six) hours as needed for mild pain or headache.    . albuterol (PROVENTIL) (2.5 MG/3ML) 0.083% nebulizer solution Take 3 mLs (2.5 mg total) by nebulization every 6 (six) hours as needed for wheezing or shortness of breath. 360 mL 0  . albuterol (VENTOLIN HFA) 108 (90 Base) MCG/ACT inhaler Inhale 1-2 puffs into the lungs every 6 (six) hours as needed for wheezing or shortness of breath. 8 g 0  . aspirin EC 81 MG tablet Take 1 tablet (81 mg total) by mouth daily. Swallow whole. 150 tablet 1  . atorvastatin (LIPITOR) 80 MG tablet Take 1 tablet (80 mg total) by mouth daily at 6 PM.    . budesonide (PULMICORT) 0.5 MG/2ML nebulizer solution Inhale into the lungs.    . clopidogrel (PLAVIX) 75 MG tablet Take 75 mg by mouth every evening.     . Fluticasone-Umeclidin-Vilant (TRELEGY ELLIPTA) 100-62.5-25 MCG/INH AEPB Inhale 1 puff into the lungs every other day.    . furosemide (LASIX) 40 MG tablet Take 40 mg by mouth daily.     Marland Kitchen. zolpidem (AMBIEN) 5 MG tablet Take 5 mg by mouth at bedtime as needed for sleep. (Patient not taking: Reported on 01/02/2021)     No current facility-administered medications on file prior to visit.    There are no Patient Instructions on file for this visit. No follow-ups on file.   Georgiana SpinnerFallon E Arryanna Holquin, NP

## 2021-02-03 ENCOUNTER — Encounter (INDEPENDENT_AMBULATORY_CARE_PROVIDER_SITE_OTHER): Payer: Self-pay | Admitting: Nurse Practitioner

## 2021-02-23 ENCOUNTER — Emergency Department: Payer: Medicare Other

## 2021-02-23 ENCOUNTER — Other Ambulatory Visit: Payer: Self-pay

## 2021-02-23 ENCOUNTER — Encounter: Payer: Self-pay | Admitting: Emergency Medicine

## 2021-02-23 ENCOUNTER — Inpatient Hospital Stay
Admission: EM | Admit: 2021-02-23 | Discharge: 2021-02-24 | DRG: 190 | Disposition: A | Discharge status: Home / Self Care | Payer: Medicare Other | Attending: Internal Medicine | Admitting: Internal Medicine

## 2021-02-23 DIAGNOSIS — I248 Other forms of acute ischemic heart disease: Secondary | ICD-10-CM | POA: Diagnosis present

## 2021-02-23 DIAGNOSIS — I700 Atherosclerosis of aorta: Secondary | ICD-10-CM | POA: Diagnosis present

## 2021-02-23 DIAGNOSIS — Z951 Presence of aortocoronary bypass graft: Secondary | ICD-10-CM | POA: Diagnosis not present

## 2021-02-23 DIAGNOSIS — I5032 Chronic diastolic (congestive) heart failure: Secondary | ICD-10-CM | POA: Diagnosis present

## 2021-02-23 DIAGNOSIS — I252 Old myocardial infarction: Secondary | ICD-10-CM

## 2021-02-23 DIAGNOSIS — J9621 Acute and chronic respiratory failure with hypoxia: Secondary | ICD-10-CM | POA: Diagnosis present

## 2021-02-23 DIAGNOSIS — F17210 Nicotine dependence, cigarettes, uncomplicated: Secondary | ICD-10-CM | POA: Diagnosis present

## 2021-02-23 DIAGNOSIS — I10 Essential (primary) hypertension: Secondary | ICD-10-CM | POA: Diagnosis present

## 2021-02-23 DIAGNOSIS — I110 Hypertensive heart disease with heart failure: Secondary | ICD-10-CM | POA: Diagnosis present

## 2021-02-23 DIAGNOSIS — Z9981 Dependence on supplemental oxygen: Secondary | ICD-10-CM

## 2021-02-23 DIAGNOSIS — I4891 Unspecified atrial fibrillation: Secondary | ICD-10-CM | POA: Diagnosis present

## 2021-02-23 DIAGNOSIS — I2510 Atherosclerotic heart disease of native coronary artery without angina pectoris: Secondary | ICD-10-CM | POA: Diagnosis present

## 2021-02-23 DIAGNOSIS — E785 Hyperlipidemia, unspecified: Secondary | ICD-10-CM | POA: Diagnosis present

## 2021-02-23 DIAGNOSIS — I480 Paroxysmal atrial fibrillation: Secondary | ICD-10-CM | POA: Diagnosis present

## 2021-02-23 DIAGNOSIS — I739 Peripheral vascular disease, unspecified: Secondary | ICD-10-CM | POA: Diagnosis present

## 2021-02-23 DIAGNOSIS — J441 Chronic obstructive pulmonary disease with (acute) exacerbation: Principal | ICD-10-CM | POA: Diagnosis present

## 2021-02-23 DIAGNOSIS — Z7902 Long term (current) use of antithrombotics/antiplatelets: Secondary | ICD-10-CM

## 2021-02-23 DIAGNOSIS — Z7982 Long term (current) use of aspirin: Secondary | ICD-10-CM | POA: Diagnosis not present

## 2021-02-23 DIAGNOSIS — I4510 Unspecified right bundle-branch block: Secondary | ICD-10-CM | POA: Diagnosis present

## 2021-02-23 DIAGNOSIS — J9601 Acute respiratory failure with hypoxia: Secondary | ICD-10-CM | POA: Diagnosis not present

## 2021-02-23 DIAGNOSIS — Z79899 Other long term (current) drug therapy: Secondary | ICD-10-CM | POA: Diagnosis not present

## 2021-02-23 DIAGNOSIS — Z20822 Contact with and (suspected) exposure to covid-19: Secondary | ICD-10-CM | POA: Diagnosis present

## 2021-02-23 DIAGNOSIS — R0602 Shortness of breath: Secondary | ICD-10-CM

## 2021-02-23 DIAGNOSIS — Z7951 Long term (current) use of inhaled steroids: Secondary | ICD-10-CM

## 2021-02-23 DIAGNOSIS — Z720 Tobacco use: Secondary | ICD-10-CM | POA: Diagnosis present

## 2021-02-23 DIAGNOSIS — J9600 Acute respiratory failure, unspecified whether with hypoxia or hypercapnia: Secondary | ICD-10-CM | POA: Diagnosis present

## 2021-02-23 LAB — BASIC METABOLIC PANEL
Anion gap: 9 (ref 5–15)
BUN: 14 mg/dL (ref 8–23)
CO2: 29 mmol/L (ref 22–32)
Calcium: 8.8 mg/dL — ABNORMAL LOW (ref 8.9–10.3)
Chloride: 99 mmol/L (ref 98–111)
Creatinine, Ser: 0.94 mg/dL (ref 0.61–1.24)
GFR, Estimated: >60 mL/min (ref >60)
Glucose, Bld: 120 mg/dL — ABNORMAL HIGH (ref 70–99)
Potassium: 4.3 mmol/L (ref 3.5–5.1)
Sodium: 137 mmol/L (ref 135–145)

## 2021-02-23 LAB — CBC WITH DIFFERENTIAL/PLATELET
Abs Immature Granulocytes: 0.03 10*3/uL (ref 0.00–0.07)
Basophils Absolute: 0.1 10*3/uL (ref 0.0–0.1)
Basophils Relative: 1 %
Eosinophils Absolute: 0.8 10*3/uL — ABNORMAL HIGH (ref 0.0–0.5)
Eosinophils Relative: 8 %
HCT: 47.1 % (ref 39.0–52.0)
Hemoglobin: 15.3 g/dL (ref 13.0–17.0)
Immature Granulocytes: 0 %
Lymphocytes Relative: 14 %
Lymphs Abs: 1.4 10*3/uL (ref 0.7–4.0)
MCH: 30.5 pg (ref 26.0–34.0)
MCHC: 32.5 g/dL (ref 30.0–36.0)
MCV: 94 fL (ref 80.0–100.0)
Monocytes Absolute: 1 10*3/uL (ref 0.1–1.0)
Monocytes Relative: 10 %
Neutro Abs: 6.9 10*3/uL (ref 1.7–7.7)
Neutrophils Relative %: 67 %
Platelets: 159 10*3/uL (ref 150–400)
RBC: 5.01 MIL/uL (ref 4.22–5.81)
RDW: 15.1 % (ref 11.5–15.5)
WBC: 10.2 10*3/uL (ref 4.0–10.5)
nRBC: 0 % (ref 0.0–0.2)

## 2021-02-23 LAB — HIV ANTIBODY (ROUTINE TESTING W REFLEX): HIV Screen 4th Generation wRfx: NONREACTIVE

## 2021-02-23 LAB — RESP PANEL BY RT-PCR (FLU A&B, COVID) ARPGX2
Influenza A by PCR: NEGATIVE
Influenza B by PCR: NEGATIVE
SARS Coronavirus 2 by RT PCR: NEGATIVE

## 2021-02-23 LAB — TROPONIN I (HIGH SENSITIVITY)
Troponin I (High Sensitivity): 21 ng/L — ABNORMAL HIGH (ref <18)
Troponin I (High Sensitivity): 138 ng/L (ref <18)
Troponin I (High Sensitivity): 189 ng/L (ref <18)
Troponin I (High Sensitivity): 269 ng/L (ref <18)

## 2021-02-23 LAB — BRAIN NATRIURETIC PEPTIDE: B Natriuretic Peptide: 59.9 pg/mL (ref 0.0–100.0)

## 2021-02-23 MED ORDER — ENSURE ENLIVE PO LIQD
237.0000 mL | Freq: Two times a day (BID) | ORAL | Status: DC
  Administered 2021-02-23 – 2021-02-24 (×2): 237 mL via ORAL

## 2021-02-23 MED ORDER — TRAMADOL HCL 50 MG PO TABS: 50.0000 mg | ORAL_TABLET | Freq: Four times a day (QID) | ORAL | Status: DC | PRN

## 2021-02-23 MED ORDER — SODIUM CHLORIDE 0.9 % IV SOLN: 250.0000 mL | INTRAVENOUS | Status: DC | PRN

## 2021-02-23 MED ORDER — ENOXAPARIN SODIUM 40 MG/0.4ML IJ SOSY
40.0000 mg | PREFILLED_SYRINGE | INTRAMUSCULAR | Status: DC
  Administered 2021-02-23 – 2021-02-24 (×2): 40 mg via SUBCUTANEOUS
  Filled 2021-02-23 (×2): qty 0.4

## 2021-02-23 MED ORDER — ACETAMINOPHEN 325 MG PO TABS: 650.0000 mg | ORAL_TABLET | Freq: Four times a day (QID) | ORAL | Status: DC | PRN

## 2021-02-23 MED ORDER — BUDESONIDE 0.5 MG/2ML IN SUSP
0.5000 mg | Freq: Two times a day (BID) | RESPIRATORY_TRACT | Status: DC
  Administered 2021-02-23 – 2021-02-24 (×3): 0.5 mg via RESPIRATORY_TRACT
  Filled 2021-02-23 (×3): qty 2

## 2021-02-23 MED ORDER — FLUTICASONE-UMECLIDIN-VILANT 100-62.5-25 MCG/INH IN AEPB: 1.0000 | INHALATION_SPRAY | RESPIRATORY_TRACT | Status: DC

## 2021-02-23 MED ORDER — IPRATROPIUM-ALBUTEROL 0.5-2.5 (3) MG/3ML IN SOLN
3.0000 mL | Freq: Once | RESPIRATORY_TRACT | Status: AC
  Administered 2021-02-23: 3 mL via RESPIRATORY_TRACT
  Filled 2021-02-23: qty 3

## 2021-02-23 MED ORDER — ASPIRIN EC 81 MG PO TBEC
81.0000 mg | DELAYED_RELEASE_TABLET | Freq: Every day | ORAL | Status: DC
  Administered 2021-02-23 – 2021-02-24 (×2): 81 mg via ORAL
  Filled 2021-02-23 (×2): qty 1

## 2021-02-23 MED ORDER — GUAIFENESIN 100 MG/5ML PO SOLN
5.0000 mL | ORAL | Status: DC | PRN
  Administered 2021-02-23 – 2021-02-24 (×2): 100 mg via ORAL
  Filled 2021-02-23 (×3): qty 5

## 2021-02-23 MED ORDER — ADULT MULTIVITAMIN W/MINERALS CH
1.0000 | ORAL_TABLET | Freq: Every day | ORAL | Status: DC
  Administered 2021-02-24: 1 via ORAL
  Filled 2021-02-23: qty 1

## 2021-02-23 MED ORDER — SODIUM CHLORIDE 0.9% FLUSH
3.0000 mL | Freq: Two times a day (BID) | INTRAVENOUS | Status: DC
  Administered 2021-02-23 – 2021-02-24 (×3): 3 mL via INTRAVENOUS

## 2021-02-23 MED ORDER — IPRATROPIUM-ALBUTEROL 0.5-2.5 (3) MG/3ML IN SOLN
3.0000 mL | Freq: Four times a day (QID) | RESPIRATORY_TRACT | Status: DC
  Administered 2021-02-23 (×2): 3 mL via RESPIRATORY_TRACT
  Filled 2021-02-23 (×2): qty 3

## 2021-02-23 MED ORDER — CLOPIDOGREL BISULFATE 75 MG PO TABS
75.0000 mg | ORAL_TABLET | Freq: Every evening | ORAL | Status: DC
  Administered 2021-02-23: 75 mg via ORAL
  Filled 2021-02-23: qty 1

## 2021-02-23 MED ORDER — SODIUM CHLORIDE 0.9% FLUSH: 3.0000 mL | INTRAVENOUS | Status: DC | PRN

## 2021-02-23 MED ORDER — ALBUTEROL SULFATE (2.5 MG/3ML) 0.083% IN NEBU
2.5000 mg | INHALATION_SOLUTION | RESPIRATORY_TRACT | Status: DC | PRN
  Administered 2021-02-24: 2.5 mg via RESPIRATORY_TRACT
  Filled 2021-02-23: qty 3

## 2021-02-23 MED ORDER — FUROSEMIDE 40 MG PO TABS
40.0000 mg | ORAL_TABLET | Freq: Every day | ORAL | Status: DC
  Administered 2021-02-23 – 2021-02-24 (×2): 40 mg via ORAL
  Filled 2021-02-23 (×2): qty 1

## 2021-02-23 MED ORDER — ATORVASTATIN CALCIUM 20 MG PO TABS
80.0000 mg | ORAL_TABLET | Freq: Every day | ORAL | Status: DC
  Administered 2021-02-23: 80 mg via ORAL
  Filled 2021-02-23: qty 4

## 2021-02-23 NOTE — Consult Note (Signed)
[facility] Cardiology  CARDIOLOGY CONSULT NOTE  Patient ID: Dennis Preston MRN: [id number] DOB/AGE: May 20, 1951 70 y.o.  Admit date: 02/23/2021 Referring Physician [facility] Primary Physician [facility] Primary Cardiologist [facility] Reason for Consultation elevated troponin  HPI: 70 year old gentleman referred for evaluation of elevated troponin.  Patient has known history of coronary artery disease, status post CABG x4.  His known COPD, on supplemental oxygen 2 L at night.  Presents with 2 to 3-day history of worsening shortness of breath with nonproductive cough consistent with COPD exacerbation.  Patient was brought to [facility] ED via EMS with hypoxia and oxygen saturation of 80%.  Treated with breather mask, intravenous steroids, 2 albuterol breathing treatments with clinical improvement.  Chest x-ray did not reveal any acute findings.  Admission labs notable for mildly elevated troponin (21, 130, 189).  ECG revealed sinus rhythm with PVCs without ischemic ST-T wave changes.  The patient denies chest pain.  The patient has known coronary artery disease, status post CABG times 12/2014 at [facility].  He underwent cardiac catheterization 01/23/2020 which revealed severe three-vessel coronary artery disease, with patent LIMA to LAD, patent SVG to distal RCA, patent SVG to ramus intermedius branch, and occluded SVG to left circumflex.  Ventriculography revealed LV ejection fraction of 45 to 50%.  Medical therapy was recommended.  Patient denies chest pain.  2D echocardiogram 01/11/2020 revealed LV ejection fraction of 50%.  Review of systems complete and found to be negative unless listed above     Past Medical History:  Diagnosis Date  . Alcohol abuse   . Amaurosis fugax   . Aortic atherosclerosis (HCC)   . Atrial fibrillation (HCC)   . Carotid stenosis   . CHF (congestive heart failure) (HCC)   . Coagulopathy (HCC)   . COPD (chronic obstructive pulmonary disease) (HCC)   . Coronary artery disease   . Depression    . 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 Hyperlipemia   . Hypertension   . Leucocytosis   . Liver dysfunction   . Long term current use of clopidogrel   . NSTEMI (non-ST elevated myocardial infarction) (HCC)    approx 2016  . Peripheral artery disease (HCC)    legs  . Tobacco abuse   . Wears dentures    full upper and lower    Past Surgical History:  Procedure Laterality Date  . APPLICATION OF WOUND VAC Right 06/22/2020   Procedure: APPLICATION OF WOUND VAC;  Surgeon: Annice Needy, MD;  Location: [facility] ORS;  Service: Vascular;  Laterality: Right;  . CARDIAC CATHETERIZATION N/A 07/10/2015   Procedure: Right/Left Heart Cath and Coronary Angiography;  Surgeon: Alwyn Pea, MD;  Location: [facility] INVASIVE CV LAB;  Service: Cardiovascular;  Laterality: N/A;  . CORONARY ARTERY BYPASS GRAFT  07/13/2015   [facility].  4 vessel  . ENDARTERECTOMY Right 01/02/2021   Procedure: ENDARTERECTOMY CAROTID;  Surgeon: Annice Needy, MD;  Location: [facility] ORS;  Service: Vascular;  Laterality: Right;  . ENDARTERECTOMY FEMORAL Bilateral 05/23/2020   Procedure: ENDARTERECTOMY FEMORAL BILATERAL SUPERFICIAL FEMORAL ARTERY STENTS ;  Surgeon: Renford Dills, MD;  Location: [facility] ORS;  Service: Vascular;  Laterality: Bilateral;  . FALSE ANEURYSM REPAIR Left 07/22/2020   Procedure: REPAIR LEFT FEMORAL PSEUDOANUERYSM;  Surgeon: Bertram Denver, MD;  Location: [facility] ORS;  Service: Vascular;  Laterality: Left;  . INSERTION OF ILIAC STENT Left 05/23/2020   Procedure: INSERTION OF ILIAC STENT;  Surgeon: Renford Dills, MD;  Location: [facility] ORS;  Service: Vascular;  Laterality: Left;  . LEFT HEART CATH AND CORS/GRAFTS ANGIOGRAPHY N/A 01/23/2020   Procedure: LEFT HEART CATH AND CORS/GRAFTS ANGIOGRAPHY;  Surgeon: Alwyn Pea, MD;  Location: [facility] INVASIVE CV LAB;  Service: Cardiovascular;  Laterality: N/A;  . LOWER EXTREMITY ANGIOGRAPHY Right 04/03/2020   Procedure: LOWER EXTREMITY ANGIOGRAPHY;   Surgeon: Renford Dills, MD;  Location: [facility] INVASIVE CV LAB;  Service: Cardiovascular;  Laterality: Right;  . OLECRANON BURSECTOMY Left 05/06/2019   Procedure: OLECRANON BURSECTOMY AND DEBRIDEMENT;  Surgeon: Signa Kell, MD;  Location: [facility] ORS;  Service: Orthopedics;  Laterality: Left;  . WOUND DEBRIDEMENT Right 06/22/2020   Procedure: DEBRIDEMENT WOUND RIGHT GROIN;  Surgeon: Annice Needy, MD;  Location: [facility] ORS;  Service: Vascular;  Laterality: Right;    Medications Prior to Admission  Medication Sig Dispense Refill Last Dose  . aspirin EC 81 MG tablet Take 1 tablet (81 mg total) by mouth daily. Swallow whole. 150 tablet 1 02/22/2021 at Unknown time  . atorvastatin (LIPITOR) 80 MG tablet Take 1 tablet (80 mg total) by mouth daily at 6 PM.   02/22/2021 at Unknown time  . budesonide (PULMICORT) 0.5 MG/2ML nebulizer solution Inhale into the lungs.   02/23/2021 at Unknown time  . clopidogrel (PLAVIX) 75 MG tablet Take 75 mg by mouth every evening.    02/22/2021 at Unknown time  . Fluticasone-Umeclidin-Vilant (TRELEGY ELLIPTA) 100-62.5-25 MCG/INH AEPB Inhale 1 puff into the lungs every other day.   02/22/2021 at Unknown time  . furosemide (LASIX) 40 MG tablet Take 40 mg by mouth daily.    02/22/2021 at Unknown time  . traMADol (ULTRAM) 50 MG tablet Take 1 tablet (50 mg total) by mouth every 6 (six) hours as needed. 30 tablet 0 02/22/2021 at Unknown time  . acetaminophen (TYLENOL) 325 MG tablet Take 650 mg by mouth every 6 (six) hours as needed for mild pain or headache.   unknown at prn  . albuterol (PROVENTIL) (2.5 MG/3ML) 0.083% nebulizer solution Take 3 mLs (2.5 mg total) by nebulization every 6 (six) hours as needed for wheezing or shortness of breath. 360 mL 0 unknown at prn  . albuterol (VENTOLIN HFA) 108 (90 Base) MCG/ACT inhaler Inhale 1-2 puffs into the lungs every 6 (six) hours as needed for wheezing or shortness of breath. 8 g 0 unknown at prn  . zolpidem (AMBIEN) 5 MG tablet Take 5 mg by  mouth at bedtime as needed for sleep. (Patient not taking: Reported on 01/02/2021)      Social History   Socioeconomic History  . Marital status: Married    Spouse name: Not on file  . Number of children: Not on file  . Years of education: Not on file  . Highest education level: Not on file  Occupational History  . Not on file  Tobacco Use  . Smoking status: Current Some Day Smoker    Packs/day: 0.50    Years: 54.00    Pack years: 27.00    Types: Cigarettes  . Smokeless tobacco: Never Used  . Tobacco comment: was 2.5 PPD for 50 yrs until 2016, quit a "few days ago"  Vaping Use  . Vaping Use: Never used  Substance and Sexual Activity  . Alcohol use: Not Currently    Comment: quit over 15 yrs ago  . Drug use: Never  . Sexual activity: Not on file  Other Topics Concern  . Not on file  Social History Narrative  . Not on file   Social  Determinants of Health   Financial Resource Strain: Not on file  Food Insecurity: Not on file  Transportation Needs: Not on file  Physical Activity: Not on file  Stress: Not on file  Social Connections: Not on file  Intimate Partner Violence: Not on file    Family History  Problem Relation Age of Onset  . Depression Sister       Review of systems complete and found to be negative unless listed above      PHYSICAL EXAM  General: Well developed, well nourished, in no acute distress HEENT:  Normocephalic and atramatic Neck:  No JVD.  Lungs: Clear bilaterally to auscultation and percussion. Heart: HRRR . Normal S1 and S2 without gallops or murmurs.  Abdomen: Bowel sounds are positive, abdomen soft and non-tender  Msk:  Back normal, normal gait. Normal strength and tone for age. Extremities: No clubbing, cyanosis or edema.   Neuro: Alert and oriented X 3. Psych:  Good affect, responds appropriately  Labs:   Lab Results  Component Value Date   WBC 10.2 02/23/2021   HGB 15.3 02/23/2021   HCT 47.1 02/23/2021   MCV 94.0  02/23/2021   PLT 159 02/23/2021    Recent Labs  Lab 02/23/21 0723  NA 137  K 4.3  CL 99  CO2 29  BUN 14  CREATININE 0.94  CALCIUM 8.8*  GLUCOSE 120*   Lab Results  Component Value Date   CKMB 5.6 (H) 07/09/2015   TROPONINI <0.03 06/13/2018   No results found for: CHOL No results found for: HDL No results found for: LDLCALC No results found for: TRIG No results found for: CHOLHDL No results found for: LDLDIRECT    Radiology: DG Chest 2 View  Result Date: 02/23/2021 CLINICAL DATA:  Shortness of breath EXAM: CHEST - 2 VIEW COMPARISON:  09/06/2020 FINDINGS: Hyperinflation and interstitial coarsening from COPD. There is no edema, consolidation, effusion, or pneumothorax. Prior CABG. Normal heart size and stable mediastinal contours. IMPRESSION: COPD without acute superimposed finding. Electronically Signed   By: Marnee Spring M.D.   On: 02/23/2021 08:32   VAS US CAROTID  Result Date: 02/08/2021 Carotid Arterial Duplex Study Patient Name:  GABREAL WORTON  Date of Exam:   02/01/2021 Medical Rec #: [id number]        Accession #:    [id number] Date of Birth: [birth year]       Patient Gender: M Patient Age:   69Y Exam Location:  [facility] Procedure:      VAS US CAROTID Referring Phys: [id number] Latina Craver SCHNIER --------------------------------------------------------------------------------  Indications:       Right endarterectomy. Comparison Study:  12/18/2020 Performing Technologist: Reece Agar RT (R)(VS)  Examination Guidelines: A complete evaluation includes B-mode imaging, spectral Doppler, color Doppler, and power Doppler as needed of all accessible portions of each vessel. Bilateral testing is considered an integral part of a complete examination. Limited examinations for reoccurring indications may be performed as noted.  Right Carotid Findings: +----------+--------+--------+--------+------------------+--------------------+           PSV cm/sEDV cm/sStenosisPlaque  DescriptionComments             +----------+--------+--------+--------+------------------+--------------------+ CCA Prox  107     13              calcific          intimal thickening   +----------+--------+--------+--------+------------------+--------------------+ CCA Mid   122     14  intimal thickening   +----------+--------+--------+--------+------------------+--------------------+ CCA Distal59      8                                                      +----------+--------+--------+--------+------------------+--------------------+ ICA Prox  40      7                                 ICA/CCA ratio = 1.03 +----------+--------+--------+--------+------------------+--------------------+ ICA Mid   81      20                                                     +----------+--------+--------+--------+------------------+--------------------+ ICA Distal110     27                                                     +----------+--------+--------+--------+------------------+--------------------+ ECA       106     11                                                     +----------+--------+--------+--------+------------------+--------------------+ +----------+--------+-------+-----------+-------------------+           PSV cm/sEDV cmsDescribe   Arm Pressure (mmHG) +----------+--------+-------+-----------+-------------------+ [id number]            Multiphasic                    +----------+--------+-------+-----------+-------------------+ +---------+--------+--+--------+--+---------+ VertebralPSV cm/s57EDV cm/s14Antegrade +---------+--------+--+--------+--+---------+ Left Carotid Findings: +----------+--------+--------+--------+------------------+-------------------+           PSV cm/sEDV cm/sStenosisPlaque DescriptionComments            +----------+--------+--------+--------+------------------+-------------------+ CCA  Prox  73      18              calcific          intimal thickening  +----------+--------+--------+--------+------------------+-------------------+ CCA Mid   104     28              calcific          intimal thickening  +----------+--------+--------+--------+------------------+-------------------+ CCA Distal80      16              calcific          intimal thickening  +----------+--------+--------+--------+------------------+-------------------+ ICA Prox  100     21              calcific          ICA/CCA raio = 3.50 +----------+--------+--------+--------+------------------+-------------------+ ICA Mid   232     66              calcific                              +----------+--------+--------+--------+------------------+-------------------+ ICA Distal278     75  calcific                              +----------+--------+--------+--------+------------------+-------------------+ ECA       107     12              calcific                              +----------+--------+--------+--------+------------------+-------------------+ +----------+--------+--------+-----------+-------------------+           PSV cm/sEDV cm/sDescribe   Arm Pressure (mmHG) +----------+--------+--------+-----------+-------------------+ [id number]              Multiphasic                    +----------+--------+--------+-----------+-------------------+ +---------+--------+--+--------+--+---------+ VertebralPSV cm/s45EDV cm/s11Antegrade +---------+--------+--+--------+--+---------+ Summary: Right Carotid: Velocities in the right ICA are consistent with a 1-39% stenosis.                Patent right carotid endarterectomy with no evidence of                restenosis. Left Carotid: Velocities in the left ICA are consistent with a 60-79% stenosis.               Hemodynamically significant plaque >50% visualized in the CCA. Vertebrals:  Bilateral vertebral arteries  demonstrate antegrade flow. Subclavians: Normal flow hemodynamics were seen in bilateral subclavian              arteries. *See table(s) above for measurements and observations.  Electronically signed by [name] MD on 02/08/2021 at 12:28:50 PM.    Final     EKG: Sinus rhythm 92 bpm with PVCs  ASSESSMENT AND PLAN:   1.  Elevated troponin (21, 130, 189), in the setting of COPD exacerbation, without chest pain, without ECG changes, most consistent with demand supply ischemia (I21.A1) not due to ACS 2.  Coronary artery disease, status post CABG x4, cardiac catheterization 01/23/2020 revealed severe three-vessel coronary artery disease, with patent LIMA to LAD, patent SVG to distal RCA, patent SVG to ramus intermedius branch, and occluded SVG to left circumflex, with LVEF 45 to 50% 3.  COPD exacerbation, clinically improved after intravenous steroids and inhaler therapy 4.  Chronic diastolic congestive heart failure, on furosemide 40 mg p.o. daily, currently appears euvolemic 5.  Paroxysmal atrial fibrillation, not on chronic anticoagulation, currently in sinus rhythm  Recommendations  1.  Agree with current therapy 2.  Defer full dose anticoagulation 3.  Defer cardiac catheterization 4.  Continue dual antiplatelet therapy 5.  Continue high intensity atorvastatin 6.  Defer starting chronic anticoagulation to Dr. Juliann Pares as outpatient  Signed: Marcina Millard MD,PhD, [facility] 02/23/2021, 1:08 PM

## 2021-02-23 NOTE — ED Provider Notes (Signed)
[facility] Emergency Department Provider Note   ____________________________________________   Event Date/Time   First MD Initiated Contact with Patient 02/23/21 0720     (approximate)  I have reviewed the triage vital signs and the nursing notes.   HISTORY  Chief Complaint Shortness of Breath    HPI Dennis Preston is a 70 y.o. male with past medical history of hypertension, hyperlipidemia, COPD, CAD status post CABG, diastolic CHF, atrial fibrillation, and peripheral arterial disease who presents to the ED complaining of shortness of breath.  Patient reports that he has had increasing difficulty breathing over the past couple of days with cough productive of clear sputum.  He denies any associated fevers or pain in his chest.  He describes symptoms as similar to prior COPD exacerbations.  When EMS arrived to his home this morning, he was giving himself a breathing treatment.  He was noted to be hypoxic on room air to the low 80s, placed on a nonrebreather and given 1 DuoNeb with an additional albuterol treatment.  He was also given 125 mg of IV Solu-Medrol.  Patient now states that he feels better and EMS states that O2 sats improved on room air.  Patient typically only wears oxygen at night.        Past Medical History:  Diagnosis Date  . Alcohol abuse   . Amaurosis fugax   . Aortic atherosclerosis (HCC)   . Atrial fibrillation (HCC)   . Carotid stenosis   . CHF (congestive heart failure) (HCC)   . Coagulopathy (HCC)   . COPD (chronic obstructive pulmonary disease) (HCC)   . Coronary artery disease   . Depression   . 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 Hyperlipemia   . Hypertension   . Leucocytosis   . Liver dysfunction   . Long term current use of clopidogrel   . NSTEMI (non-ST elevated myocardial infarction) (HCC)    approx 2016  . Peripheral artery disease (HCC)    legs  . Tobacco abuse   . Wears dentures     full upper and lower    Patient Active Problem List   Diagnosis Date Noted  . S/P carotid endarterectomy 01/16/2021  . Carotid stenosis, right 01/02/2021  . Essential hypertension 12/18/2020  . Hyperlipidemia 12/18/2020  . Acute respiratory failure (HCC) 09/06/2020  . CHF (congestive heart failure) (HCC)   . Hypokalemia   . 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 Atherosclerosis of artery of extremity with rest pain (HCC) 05/23/2020  . Chronic venous insufficiency 03/01/2020  . COPD exacerbation (HCC) 02/13/2020  . Aortic atherosclerosis (HCC) 08/08/2019  . COPD (chronic obstructive pulmonary disease) with chronic bronchitis (HCC) 08/08/2019  . COPD with acute exacerbation (HCC) 06/13/2018  . Tobacco use 01/14/2017  . A-fib (HCC) 07/18/2015  . Coagulopathy (HCC) 07/17/2015  . Hypotension 07/14/2015  . Leukocytosis 07/14/2015  . PAD (peripheral artery disease) (HCC) 07/14/2015  . S/P coronary artery bypass graft x 4 07/14/2015  . Carotid atherosclerosis, bilateral 07/12/2015  . Transient visual loss of right eye 07/11/2015  . NSTEMI (non-ST elevated myocardial infarction) (HCC) 07/10/2015  . Amaurosis fugax 07/10/2015  . Coronary artery disease 07/10/2015  . Hypoxia 07/09/2015  . ANXIETY 02/24/2007  . ERECTILE DYSFUNCTION 02/24/2007  . LIVER FUNCTION TESTS, ABNORMAL 02/24/2007    Past Surgical History:  Procedure Laterality Date  . APPLICATION OF WOUND VAC Right 06/22/2020   Procedure: APPLICATION OF WOUND VAC;  Surgeon: Wyn Quakerew,  Marlow Baars, MD;  Location: [facility] ORS;  Service: Vascular;  Laterality: Right;  . CARDIAC CATHETERIZATION N/A 07/10/2015   Procedure: Right/Left Heart Cath and Coronary Angiography;  Surgeon: Alwyn Pea, MD;  Location: [facility];  Service: Cardiovascular;  Laterality: N/A;  . CORONARY ARTERY BYPASS GRAFT  07/13/2015   [facility].  4 vessel  . ENDARTERECTOMY Right 01/02/2021   Procedure: ENDARTERECTOMY CAROTID;  Surgeon: Annice Needy,  MD;  Location: [facility] ORS;  Service: Vascular;  Laterality: Right;  . ENDARTERECTOMY FEMORAL Bilateral 05/23/2020   Procedure: ENDARTERECTOMY FEMORAL BILATERAL SUPERFICIAL FEMORAL ARTERY STENTS ;  Surgeon: Renford Dills, MD;  Location: [facility] ORS;  Service: Vascular;  Laterality: Bilateral;  . FALSE ANEURYSM REPAIR Left 07/22/2020   Procedure: REPAIR LEFT FEMORAL PSEUDOANUERYSM;  Surgeon: Bertram Denver, MD;  Location: [facility] OR[facility];  Service: Vascular;  Laterality: Left;  . INSERTION OF ILIAC STENT Left 05/23/2020   Procedure: INSERTION OF ILIAC STENT;  Surgeon: Renford Dills, MD;  Location: [facility] OR[facility];  Service: Vascular;  Laterality: Left;  . LEFT HEART CATH AND CORS/GRAFTS ANGIOGRAPHY N/A 01/23/2020   Procedure: LEFT HEART CATH AND CORS/GRAFTS ANGIOGRAPHY;  Surgeon: Alwyn Pea, MD;  Location: [facility];  Service: Cardiovascular;  Laterality: N/A;  . LOWER EXTREMITY ANGIOGRAPHY Right 04/03/2020   Procedure: LOWER EXTREMITY ANGIOGRAPHY;  Surgeon: Renford Dills, MD;  Location: [facility];  Service: Cardiovascular;  Laterality: Right;  . OLECRANON BURSECTOMY Left 05/06/2019   Procedure: OLECRANON BURSECTOMY AND DEBRIDEMENT;  Surgeon: Signa Kell, MD;  Location: [facility];  Service: Orthopedics;  Laterality: Left;  . WOUND DEBRIDEMENT Right 06/22/2020   Procedure: DEBRIDEMENT WOUND RIGHT GROIN;  Surgeon: Annice Needy, MD;  Location: [facility];  Service: Vascular;  Laterality: Right;    Prior to Admission medications   Medication Sig Start Date End Date Taking? Authorizing Provider  acetaminophen (TYLENOL) 325 MG tablet Take 650 mg by mouth every 6 (six) hours as needed for mild pain or headache.    [provider]  albuterol (PROVENTIL) (2.5 MG/3ML) 0.083% nebulizer solution Take 3 mLs (2.5 mg total) by nebulization every 6 (six) hours as needed for wheezing or shortness of breath. 02/15/20   Lurene Shadow, MD  albuterol (VENTOLIN HFA) 108 (90 Base) MCG/ACT  inhaler Inhale 1-2 puffs into the lungs every 6 (six) hours as needed for wheezing or shortness of breath. 02/15/20   Lurene Shadow, MD  aspirin EC 81 MG tablet Take 1 tablet (81 mg total) by mouth daily. Swallow whole. 04/03/20 04/03/21  Schnier, Latina Craver, MD  atorvastatin (LIPITOR) 80 MG tablet Take 1 tablet (80 mg total) by mouth daily at 6 PM. 07/10/15   Sainani, Rolly Pancake, MD  budesonide (PULMICORT) 0.5 MG/2ML nebulizer solution Inhale into the lungs. 11/01/20 11/01/21  [provider]  clopidogrel (PLAVIX) 75 MG tablet Take 75 mg by mouth every evening.  04/18/16   [provider]  Fluticasone-Umeclidin-Vilant (TRELEGY ELLIPTA) 100-62.5-25 MCG/INH AEPB Inhale 1 puff into the lungs every other day. 01/27/20   [provider]  furosemide (LASIX) 40 MG tablet Take 40 mg by mouth daily.  12/26/19   [provider]  traMADol (ULTRAM) 50 MG tablet Take 1 tablet (50 mg total) by mouth every 6 (six) hours as needed. 02/01/21   Georgiana Spinner, NP  zolpidem (AMBIEN) 5 MG tablet Take 5 mg by mouth at bedtime as needed for sleep. Patient not taking: Reported on 01/02/2021 11/28/20 12/28/20  [provider]    Allergies Chlorhexidine  Family History  Problem Relation Age of Onset  . Depression Sister     Social History Social History   Tobacco Use  . Smoking status: Current Some Day Smoker    Packs/day: 0.50    Years: 54.00    Pack years: 27.00    Types: Cigarettes  . Smokeless tobacco: Never Used  . Tobacco comment: was 2.5 PPD for 50 yrs until 2016, quit a "few days ago"  Vaping Use  . Vaping Use: Never used  Substance Use Topics  . Alcohol use: Not Currently    Comment: quit over 15 yrs ago  . Drug use: Never    Review of Systems  Constitutional: No fever/chills Eyes: No visual changes. ENT: No sore throat. Cardiovascular: Denies chest pain. Respiratory: Positive for cough and shortness of breath. Gastrointestinal: No abdominal pain.  No nausea, no  vomiting.  No diarrhea.  No constipation. Genitourinary: Negative for dysuria. Musculoskeletal: Negative for back pain. Skin: Negative for rash. Neurological: Negative for headaches, focal weakness or numbness.  ____________________________________________   PHYSICAL EXAM:  VITAL SIGNS: ED Triage Vitals  Enc Vitals Group     BP      Pulse      Resp      Temp      Temp src      SpO2      Weight      Height      Head Circumference      Peak Flow      Pain Score      Pain Loc      Pain Edu?      Excl. in GC?     Constitutional: Alert and oriented. Eyes: Conjunctivae are normal. Head: Atraumatic. Nose: No congestion/rhinnorhea. Mouth/Throat: Mucous membranes are moist. Neck: Normal ROM Cardiovascular: Normal rate, regular rhythm. Grossly normal heart sounds. Respiratory: Tachypneic with increased respiratory effort.  No retractions. Lungs with poor air movement throughout and expiratory wheezing. Gastrointestinal: Soft and nontender. No distention. Genitourinary: deferred Musculoskeletal: No lower extremity tenderness nor edema. Neurologic:  Normal speech and language. No gross focal neurologic deficits are appreciated. Skin:  Skin is warm, dry and intact. No rash noted. Psychiatric: Mood and affect are normal. Speech and behavior are normal.  ____________________________________________   LABS (all labs ordered are listed, but only abnormal results are displayed)  Labs Reviewed  CBC WITH DIFFERENTIAL/PLATELET - Abnormal; Notable for the following components:      Result Value   Eosinophils Absolute 0.8 (*)    All other components within normal limits  BASIC METABOLIC PANEL - Abnormal; Notable for the following components:   Glucose, Bld 120 (*)    Calcium 8.8 (*)    All other components within normal limits  TROPONIN I (HIGH SENSITIVITY) - Abnormal; Notable for the following components:   Troponin I (High Sensitivity) 21 (*)    All other components within  normal limits  RESP PANEL BY RT-PCR (FLU A&B, COVID) ARPGX2  BRAIN NATRIURETIC PEPTIDE  HIV ANTIBODY (ROUTINE TESTING W REFLEX)  TROPONIN I (HIGH SENSITIVITY)   ____________________________________________  EKG  ED ECG REPORT I, Chesley Noon, the attending physician, personally viewed and interpreted this ECG.   Date: 02/23/2021  EKG Time: 7:22  Rate: 92  Rhythm: normal sinus rhythm, occasional PVC  Axis: Normal  Intervals:Incomplete RBBB  ST&T Change: None   PROCEDURES  Procedure(s) performed (including Critical Care):  .Critical Care Performed by: Chesley Noon, MD Authorized  by: Chesley Noon, MD   Critical care provider statement:    Critical care time (minutes):  45   Critical care time was exclusive of:  Separately billable procedures and treating other patients and teaching time   Critical care was necessary to treat or prevent imminent or life-threatening deterioration of the following conditions:  Respiratory failure   Critical care was time spent personally by me on the following activities:  Discussions with consultants, evaluation of patient's response to treatment, examination of patient, ordering and performing treatments and interventions, ordering and review of laboratory studies, ordering and review of radiographic studies, pulse oximetry, re-evaluation of patient's condition, obtaining history from patient or surrogate and review of old charts   I assumed direction of critical care for this patient from another provider in my specialty: no     Care discussed with: admitting provider       ____________________________________________   INITIAL IMPRESSION / ASSESSMENT AND PLAN / ED COURSE       70 year old male with past medical history of hypertension, hyperlipidemia, COPD, CAD status post CABG, diastolic CHF, atrial fibrillation, and peripheral arterial disease who presents to the ED with increasing cough and shortness of breath for the past 2  days.  He was noted to be in respiratory distress by EMS, received IV Solu-Medrol along with 2 breathing treatments, now feels better and his work of breathing appears to have improved.  He does have expiratory wheezing consistent with a COPD exacerbation, no signs of fluid overload and low suspicion for cardiac etiology.  EKG shows no evidence of arrhythmia or ischemia, we will check chest x-ray and labs including troponin.  Chest x-ray reviewed by me and shows no infiltrate, edema, or effusion.  Given patient has no purulent sputum production we will hold off on antibiotics.  He does appear overall improved but noted to drop O2 sats to 86% on room air while at rest.  He was placed on 2 L nasal cannula and case discussed with hospitalist for admission.      ____________________________________________   FINAL CLINICAL IMPRESSION(S) / ED DIAGNOSES  Final diagnoses:  COPD exacerbation (HCC)  Acute on chronic respiratory failure with hypoxia [facility])     ED Discharge Orders    None       Note:  This document was prepared using Dragon voice recognition software and may include unintentional dictation errors.   Chesley Noon, MD 02/23/21 [phone]

## 2021-02-23 NOTE — ED Notes (Signed)
Informed RN bed assigned 

## 2021-02-23 NOTE — H&P (Signed)
History and Physical    HASSANI SLINEY UXN:[id number] DOB: [birth year] DOA: 02/23/2021  PCP: Barbette Reichmann, MD   Patient coming from: Home  I have personally briefly reviewed patient's old medical records in [facility]  Chief Complaint: Shortness of breath  HPI: Dennis Preston is a 70 y.o. male with medical history significant for COPD with chronic respiratory failure on 2L of oxygen at night, chronic diastolic dysfunction CHF with last known LVEF of 50%, A. fib and nicotine dependence who presents to the ER for evaluation of shortness of breath for over a week but worse on the day of admission.   Patient states that his symptoms have progressively worsened over the last couple of days associated with a cough productive of clear phlegm. He had used his friend's inhaler without any improvement in his symptoms. He woke up on the day of his admission and was unable to breath and so he called EMS.  Per EMS patient's was hypoxic with room air pulse oximetry of 80% and was placed on a nonrebreather mask with improvement in his pulse.  Patient received Solumedrol 125 mg x 1 dose, 2 albuterol treatments as well as Atrovent. He denies having any chest pain, nausea, vomiting, no changes in his bowel habits, no dizziness, no lightheadedness, no headache, no fever, no chills, no palpitations, no diaphoresis. Labs show sodium 137, potassium 4.3, chloride 99, bicarb 29, glucose 120, BUN 14, creatinine 0.94, calcium 8.8, BNP 59, troponin 21, white count 10.2, hemoglobin 15.3, hematocrit 47.1, MCV 94, RDW 15.1, platelet count 159, Chest x-ray reviewed by me shows hyperinflated lung fields with no acute findings. Twelve-lead EKG reviewed by me shows sinus rhythm, PVCs and incomplete right bundle branch block.     ED Course: Patient  is a 70 year old Caucasian male with a history of COPD with chronic respiratory failure on 2 L of oxygen at night, A. fib, nicotine dependence who presents to the ER  for evaluation of shortness of breath associated with a cough productive of clear phlegm and wheezing for over a week. Patient called EMS because of worsening symptoms and was found to be hypoxic with room air pulse oximetry of 81%. He was placed on a NRM by EMS with improvement in his pulse oximetry and also received IV Solumedrol, albuterol and atrovent. He is currently on 4L of oxygen via nasal canula. Patient will be admitted to the hospital for further evaluation.      Review of Systems: As per HPI otherwise all other systems reviewed and negative.    Past Medical History:  Diagnosis Date  . Alcohol abuse   . Amaurosis fugax   . Aortic atherosclerosis (HCC)   . Atrial fibrillation (HCC)   . Carotid stenosis   . CHF (congestive heart failure) (HCC)   . Coagulopathy (HCC)   . COPD (chronic obstructive pulmonary disease) (HCC)   . Coronary artery disease   . Depression   . 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 Hyperlipemia   . Hypertension   . Leucocytosis   . Liver dysfunction   . Long term current use of clopidogrel   . NSTEMI (non-ST elevated myocardial infarction) (HCC)    approx 2016  . Peripheral artery disease (HCC)    legs  . Tobacco abuse   . Wears dentures    full upper and lower    Past Surgical History:  Procedure Laterality Date  . APPLICATION OF WOUND VAC Right 06/22/2020  Procedure: APPLICATION OF WOUND VAC;  Surgeon: Dew, Jason S, MD;  Location: [facility] ORS;  Service: Vascular;  Laterality: Right;  . CARDIAC CATHETERIZATION N/A 07/10/2015   Procedure: Right/Left Heart CaAnnice Needyth and Coronary Angiography;  Surgeon: Alwyn Peawayne D Callwood, MD;  Location: [facility] INVASIVE CV LAB;  Service: Cardiovascular;  Laterality: N/A;  . CORONARY ARTERY BYPASS GRAFT  07/13/2015   [facility].  4 vessel  . ENDARTERECTOMY Right 01/02/2021   Procedure: ENDARTERECTOMY CAROTID;  Surgeon: Annice Needyew, Jason S, MD;  Location: [facility] ORS;  Service: Vascular;  Laterality: Right;  .  ENDARTERECTOMY FEMORAL Bilateral 05/23/2020   Procedure: ENDARTERECTOMY FEMORAL BILATERAL SUPERFICIAL FEMORAL ARTERY STENTS ;  Surgeon: Renford DillsSchnier, Gregory G, MD;  Location: [facility] ORS;  Service: Vascular;  Laterality: Bilateral;  . FALSE ANEURYSM REPAIR Left 07/22/2020   Procedure: REPAIR LEFT FEMORAL PSEUDOANUERYSM;  Surgeon: Bertram DenverEsco, Miechia A, MD;  Location: [facility] ORS;  Service: Vascular;  Laterality: Left;  . INSERTION OF ILIAC STENT Left 05/23/2020   Procedure: INSERTION OF ILIAC STENT;  Surgeon: Renford DillsSchnier, Gregory G, MD;  Location: [facility] ORS;  Service: Vascular;  Laterality: Left;  . LEFT HEART CATH AND CORS/GRAFTS ANGIOGRAPHY N/A 01/23/2020   Procedure: LEFT HEART CATH AND CORS/GRAFTS ANGIOGRAPHY;  Surgeon: Alwyn Peaallwood, Dwayne D, MD;  Location: [facility] INV[facility]IVE CV LAB;  Service: Cardiovascular;  Laterality: N/A;  . LOWER EXTREMITY ANGIOGRAPHY Right 04/03/2020   Procedure: LOWER EXTREMITY ANGIOGRAPHY;  Surgeon: Renford DillsSchnier, Gregory G, MD;  Location: [facility] INVASIVE CV LAB;  Service: Cardiovascular;  Laterality: Right;  . OLECRANON BURSECTOMY Left 05/06/2019   Procedure: OLECRANON BURSECTOMY AND DEBRIDEMENT;  Surgeon: Signa KellPatel, Sunny, MD;  Location: [facility] ORS;  Service: Orthopedics;  Laterality: Left;  . WOUND DEBRIDEMENT Right 06/22/2020   Procedure: DEBRIDEMENT WOUND RIGHT GROIN;  Surgeon: Annice Needyew, Jason S, MD;  Location: [facility] ORS;  Service: Vascular;  Laterality: Right;     reports that he has been smoking cigarettes. He has a 27.00 pack-year smoking history. He has never used smokeless tobacco. He reports previous alcohol use. He reports that he does not use drugs.  Allergies  Allergen Reactions  . Chlorhexidine     Family History  Problem Relation Age of Onset  . Depression Sister       Prior to Admission medications   Medication Sig Start Date End Date Taking? Authorizing Provider  acetaminophen (TYLENOL) 325 MG tablet Take 650 mg by mouth every 6 (six) hours as needed for mild pain or headache.    [provider]  albuterol (PROVENTIL) (2.5 MG/3ML) 0.083% nebulizer solution Take 3 mLs (2.5 mg total) by nebulization every 6 (six) hours as needed for wheezing or shortness of breath. 02/15/20   Lurene ShadowAyiku, Bernard, MD  albuterol (VENTOLIN HFA) 108 (90 Base) MCG/ACT inhaler Inhale 1-2 puffs into the lungs every 6 (six) hours as needed for wheezing or shortness of breath. 02/15/20   Lurene ShadowAyiku, Bernard, MD  aspirin EC 81 MG tablet Take 1 tablet (81 mg total) by mouth daily. Swallow whole. 04/03/20 04/03/21  Schnier, Latina CraverGregory G, MD  atorvastatin (LIPITOR) 80 MG tablet Take 1 tablet (80 mg total) by mouth daily at 6 PM. 07/10/15   Sainani, Rolly PancakeVivek J, MD  budesonide (PULMICORT) 0.5 MG/2ML nebulizer solution Inhale into the lungs. 11/01/20 11/01/21  [provider]  clopidogrel (PLAVIX) 75 MG tablet Take 75 mg by mouth every evening.  04/18/16   [provider]  Fluticasone-Umeclidin-Vilant (TRELEGY ELLIPTA) 100-62.5-25 MCG/INH AEPB Inhale 1 puff into the lungs every other day. 01/27/20   [provider]  furosemide (LASIX) 40 MG tablet Take 40 mg by mouth daily.  12/26/19   [provider]  traMADol (ULTRAM) 50 MG tablet Take 1 tablet (50 mg total) by mouth every 6 (six) hours as needed. 02/01/21   Georgiana Spinner, NP  zolpidem (AMBIEN) 5 MG tablet Take 5 mg by mouth at bedtime as needed for sleep. Patient not taking: Reported on 01/02/2021 11/28/20 12/28/20  [provider]    Physical Exam: Vitals:   02/23/21 0723 02/23/21 0728 02/23/21 0730  BP:  (!) 142/60 136/62  Pulse:  94 87  Resp:  (!) 22 (!) 25  Temp:  98.5 F (36.9 C)   TempSrc:  Oral   SpO2:  94% 91%  Weight: 72.6 kg    Height: 5\' 5"  (1.651 m)       Vitals:   02/23/21 0723 02/23/21 0728 02/23/21 0730  BP:  (!) 142/60 136/62  Pulse:  94 87  Resp:  (!) 22 (!) 25  Temp:  98.5 F (36.9 C)   TempSrc:  Oral   SpO2:  94% 91%  Weight: 72.6 kg    Height: 5\' 5"  (1.651 m)        Constitutional: Alert and oriented  x 3 . Not in any apparent distress HEENT:      Head: Normocephalic and atraumatic.         Eyes: PERLA, EOMI, Conjunctivae are normal. Sclera is non-icteric.       Mouth/Throat: Mucous membranes are moist.       Neck: Supple with no signs of meningismus. Cardiovascular: Regular rate and rhythm. No murmurs, gallops, or rubs. 2+ symmetrical distal pulses are present . No JVD. No LE edema Respiratory: Tachypneic . Wheezes in both lung fields bilaterally. No crackles, or rhonchi.  Gastrointestinal: Soft, non tender, and non distended with positive bowel sounds.  Genitourinary: No CVA tenderness. Musculoskeletal: Nontender with normal range of motion in all extremities. No cyanosis, or erythema of extremities. Neurologic:  Face is symmetric. Moving all extremities. No gross focal neurologic deficits  Skin: Skin is warm, dry.  No rash or ulcers Psychiatric: Mood and affect are normal   Labs on Admission: I have personally reviewed following labs and imaging studies  CBC: Recent Labs  Lab 02/23/21 0723  WBC 10.2  NEUTROABS 6.9  HGB 15.3  HCT 47.1  MCV 94.0  PLT 159   Basic Metabolic Panel: Recent Labs  Lab 02/23/21 0723  NA 137  K 4.3  CL 99  CO2 29  GLUCOSE 120*  BUN 14  CREATININE 0.94  CALCIUM 8.8*   GFR: Estimated Creatinine Clearance: 64.5 mL/min (by C-G formula based on SCr of 0.94 mg/dL). Liver Function Tests: No results for input(s): AST, ALT, ALKPHOS, BILITOT, PROT, ALBUMIN in the last 168 hours. No results for input(s): LIPASE, AMYLASE in the last 168 hours. No results for input(s): AMMONIA in the last 168 hours. Coagulation Profile: No results for input(s): INR, PROTIME in the last 168 hours. Cardiac Enzymes: No results for input(s): CKTOTAL, CKMB, CKMBINDEX, TROPONINI in the last 168 hours. BNP (last 3 results) No results for input(s): PROBNP in the last 8760 hours. HbA1C: No results for input(s): HGBA1C in the last 72 hours. CBG: No results for input(s):  GLUCAP in the last 168 hours. Lipid Profile: No results for input(s): CHOL, HDL, LDLCALC, TRIG, CHOLHDL, LDLDIRECT in the last 72 hours. Thyroid Function Tests: No results for input(s): TSH, T4TOTAL, FREET4, T3FREE, THYROIDAB in the last 72 hours. Anemia Panel:  No results for input(s): VITAMINB12, FOLATE, FERRITIN, TIBC, IRON, RETICCTPCT in the last 72 hours. Urine analysis:    Component Value Date/Time   COLORURINE YELLOW 05/10/2008 1654   APPEARANCEUR CLEAR 05/10/2008 1654   LABSPEC 1.021 05/10/2008 1654   PHURINE 5.5 05/10/2008 1654   GLUCOSEU NEGATIVE 05/10/2008 1654   HGBUR NEGATIVE 05/10/2008 1654   BILIRUBINUR NEGATIVE 05/10/2008 1654   KETONESUR NEGATIVE 05/10/2008 1654   PROTEINUR NEGATIVE 05/10/2008 1654   UROBILINOGEN 0.2 05/10/2008 1654   NITRITE NEGATIVE 05/10/2008 1654   LEUKOCYTESUR  05/10/2008 1654    NEGATIVE MICROSCOPIC NOT DONE ON URINES WITH NEGATIVE PROTEIN, BLOOD, LEUKOCYTES, NITRITE, OR GLUCOSE <1000 mg/dL.    Radiological Exams on Admission: DG Chest 2 View  Result Date: 02/23/2021 CLINICAL DATA:  Shortness of breath EXAM: CHEST - 2 VIEW COMPARISON:  09/06/2020 FINDINGS: Hyperinflation and interstitial coarsening from COPD. There is no edema, consolidation, effusion, or pneumothorax. Prior CABG. Normal heart size and stable mediastinal contours. IMPRESSION: COPD without acute superimposed finding. Electronically Signed   By: Marnee Spring M.D.   On: 02/23/2021 08:32     Assessment/Plan Principal Problem:   COPD with acute exacerbation (HCC) Active Problems:   A-fib (HCC)   Coronary artery disease   Tobacco use   Acute respiratory failure (HCC)   Essential hypertension      Acute on chronic respiratory failure Secondary to acute COPD exacerbation Patient has a history of chronic respiratory failure on 2 L of oxygen at bedtime He presents for evaluation of shortness of breath also said to be hypoxic with room air pulse of 81% He is currently on  4 L of oxygen with pulse oximetry in the 90"s Patient will need to be assessed for home oxygen need prior to discharge    Acute COPD exacerbation Patient has a known history of COPD and continues to smoke He presents with worsening shortness of breath associated with a cough productive of clear phlegm and wheezing Place patient on scheduled and as needed bronchodilator therapy Place patient on inhaled steroids    Chronic diastolic dysfunction CHF Patient had a 2D echocardiogram which was done in April 2021 and shows a normal LV systolic function, LVEF of 50 to 55% with mild diastolic dysfunction Continue Lasix 40 mg daily    Nicotine dependence Smoking cessation was discussed with patient He declines a nicotine transdermal patch at this time     History of coronary artery disease Continue aspirin, Plavix and statins     Paroxysmal A. Fib Currently in sinus rhythm Patient not on long-term anticoagulation therapy      DVT prophylaxis: Lovenox Code Status: full code Family Communication: Greater than 50% of time was spent discussing plan of care with patient and his wife at the bedside.  All questions and concerns have been addressed.  He verbalizes understanding and agrees with the plan.  Disposition Plan: Back to previous home environment Consults called: none Status: At the time of admission, it appears that the appropriate admission status for this patient is inpatient. This is judged to be reasonable and necessary in order to provide the required intensity of service to ensure the patient's safety given the presenting symptoms, physical exam findings and initial radiographic and laboratory data in the context of their comorbid conditions. Patient requires inpatient status due to high intensity of service, high risk for further deterioration and high frequency of surveillance required.    Lucile Shutters MD [facility]     02/23/2021,  10:07 AM

## 2021-02-23 NOTE — Progress Notes (Addendum)
Initial Nutrition Assessment  RD working remotely.  DOCUMENTATION CODES:   Not applicable  INTERVENTION:   - Ensure Enlive po BID, each supplement provides 350 kcal and 20 grams of protein  - MVI with minerals daily  NUTRITION DIAGNOSIS:   Increased nutrient needs related to chronic illness as evidenced by estimated needs.  GOAL:   Patient will meet greater than or equal to 90% of their needs  MONITOR:   PO intake,Supplement acceptance,Labs,Weight trends,I & O's  REASON FOR ASSESSMENT:   Malnutrition Screening Tool,Consult COPD Protocol  ASSESSMENT:   70 year old male who presented to the ED on 5/28 with SOB. PMH of HTN, HLD, COPD, CAD s/p CABG, CHF, atrial fibrillation, PAD. Pt admitted with COPD exacerbation.   Spoke with pt via phone call to room. Pt reports that he has a good appetite and eats well. Pt shares that at home he typically eats 1-2 times a day. For breakfast, pt reports that he always has his cocoa crisp cereal. Pt typically eats again for dinner where he may have chicken, burger, or steak. Pt denies any recent changes with his appetite.  Pt endorses a weight loss of ~15 lbs after surgery in August 2021. Pt reports that his UBW prior to surgery was 174 lbs. Reviewed weight history in chart. Pt's weight has fluctuated between 70-77 kg over the last year. Suspect some of weight fluctuation can be attributed to fluid status given CHF diagnosis. Difficult to determine dry weight loss. Weight on admission of 160 lbs (72.6 kg) appears stated rather than measured.  Pt willing to consume Ensure oral nutrition supplements during admission to meet increased kcal and protein needs. RD to order. Will also order daily MVI with minerals.  Medications reviewed and include: lasix  Labs reviewed.  NUTRITION - FOCUSED PHYSICAL EXAM:  Unable to complete at this time. RD working remotely.  Diet Order:   Diet Order            Diet 2 gram sodium Room service appropriate?  Yes; Fluid consistency: Thin  Diet effective now                 EDUCATION NEEDS:   No education needs have been identified at this time  Skin:  Skin Assessment: Reviewed RN Assessment  Last BM:  02/22/21  Height:   Ht Readings from Last 1 Encounters:  02/23/21 5\' 5"  (1.651 m)    Weight:   Wt Readings from Last 1 Encounters:  02/23/21 72.6 kg    BMI:  Body mass index is 26.63 kg/m.  Estimated Nutritional Needs:   Kcal:  1850-2050  Protein:  85-100 grams  Fluid:  1.8-2.0 L    02/25/21, MS, RD, LDN Inpatient Clinical Dietitian Please see AM[facility]ON for contact information.

## 2021-02-23 NOTE — Evaluation (Signed)
Occupational Therapy Evaluation Patient Details Name: Dennis Preston MRN: [id number] DOB: 06-Nov-1950 Today's Date: 02/23/2021    History of Present Illness 70 year old gentleman referred for evaluation of elevated troponin.  Patient has known history of coronary artery disease, status post CABG x4.  His known COPD, on supplemental oxygen 2 L at night.  Presents with 2 to 3-day history of worsening shortness of breath with nonproductive cough consistent with COPD exacerbation.  Patient was brought to [facility] ED via EMS with hypoxia and oxygen saturation of 80%.  Treated with breather mask, intravenous steroids, 2 albuterol breathing treatments with clinical improvement.  Chest x-ray did not reveal any acute findings.  Admission labs notable for mildly elevated troponin (21, 130, 189).  ECG revealed sinus rhythm with PVCs without ischemic ST-T wave changes.   Clinical Impression   Pt supine in bed with no c/o pain and agreeable to OT intervention. Family member present in the room. Pt reports living at home with wife at baseline. Pt reports being independent in self care, functional mobility, and IADL tasks. Pt endorses uses of 2 L at home (mostly at night). He requests to remove Oxygen in order to ambulate to bathroom. When pt returns, O2 desaturation to mid 80s on RA. Pt placed back on 2Ls with O2 saturation back in the 90's. Pt did need assist to don R sock this session but reports that as baseline deficit from prior surgery and endorses only wearing sandals at home. Pt performed all mobility and self care independently this session without assistance or use of AD. No LOB noted. No skilled OT need at this time. OT to SIGN OFF. Thank you for this referral.     Follow Up Recommendations  No OT follow up    Equipment Recommendations  None recommended by OT       Precautions / Restrictions Precautions Precautions: None      Mobility Bed Mobility Overal bed mobility: Independent                   Transfers Overall transfer level: Independent                    Balance Overall balance assessment: Independent                                         ADL either performed or assessed with clinical judgement   ADL Overall ADL's : Needs assistance/impaired Eating/Feeding: Independent   Grooming: Wash/dry hands;Wash/dry face;Independent               Lower Body Dressing: Minimal assistance Lower Body Dressing Details (indicate cue type and reason): Pt needs assist with donning R socks/shoes from prior surgery Toilet Transfer: Independent           Functional mobility during ADLs: Independent       Vision Patient Visual Report: No change from baseline              Pertinent Vitals/Pain Pain Assessment: No/denies pain     Hand Dominance Right   Extremity/Trunk Assessment Upper Extremity Assessment Upper Extremity Assessment: Overall WFL for tasks assessed   Lower Extremity Assessment Lower Extremity Assessment: Overall WFL for tasks assessed   Cervical / Trunk Assessment Cervical / Trunk Assessment: Normal   Communication Communication Communication: No difficulties   Cognition Arousal/Alertness: Awake/alert Behavior During Therapy: WFL for tasks assessed/performed Overall Cognitive  Status: Within Functional Limits for tasks assessed                                 General Comments: Pt is pleasant and cooperative. A & O x4.              Home Living Family/patient expects to be discharged to:: Private residence Living Arrangements: Spouse/significant other;Children Available Help at Discharge: Family;Available 24 hours/day Type of Home: House Home Access: Stairs to enter [employer] of Steps: 4 Entrance Stairs-Rails: Right;Left Home Layout: One level         Firefighter: Standard Bathroom Accessibility: Yes   Home Equipment: Cane - single point          Prior  Functioning/Environment Level of Independence: Independent        Comments: Ind amb community distances without an AD, has access to 2LO2/min PRN . Pt reports being I with self care and IADL tasks                 OT Goals(Current goals can be found in the care plan section) Acute Rehab OT Goals Patient Stated Goal: to go home OT Goal Formulation: With patient/family Time For Goal Achievement: 02/23/21 Potential to Achieve Goals: Good  OT Frequency:      AM-PAC OT "6 Clicks" Daily Activity     Outcome Measure Help from another person eating meals?: None Help from another person taking care of personal grooming?: None Help from another person toileting, which includes using toliet, bedpan, or urinal?: None Help from another person bathing (including washing, rinsing, drying)?: None Help from another person to put on and taking off regular upper body clothing?: None Help from another person to put on and taking off regular lower body clothing?: None 6 Click Score: 24   End of Session Equipment Utilized During Treatment: Oxygen (2Ls) Nurse Communication: Mobility status;Other (comment) (decreased to 2 Ls from 4 this session via [place])  Activity Tolerance: Patient tolerated treatment well Patient left: in bed;with call bell/phone within reach;with bed alarm set  OT Visit Diagnosis: Unsteadiness on feet (R26.81)                Time: 8588-5027 OT Time Calculation (min): 21 min Charges:  OT General Charges $OT Visit: 1 Visit OT Evaluation $OT Eval Low Complexity: 1 Low OT Treatments $Self Care/Home Management : 8-22 mins   Jackquline Denmark, MS, OTR/L , CBIS ascom [phone]  02/23/21, 2:53 PM   02/23/2021, 2:52 PM

## 2021-02-23 NOTE — Progress Notes (Signed)
Date and time results received: 02/23/21 11:45  Lab called RN and informed of lab result  Test: [place] Critical Value: 138  Name of Provider Notified: Agbata  Orders Received? Or Actions Taken?: Md stated they were consulting cardio and ordering more labs.

## 2021-02-23 NOTE — ED Triage Notes (Signed)
Pt via [facility] from home. Pt c/o worsening SOB this morning. Pt has had trouble for a couple of days, c/o productive cough. Denies fever. Denies CP. EMS gave 2 albuterol, 1 Atrovent treatment, 125 Solu-Medrol. Pt has a hx of COPD but only wears 2L at night. Pt is A&Ox4

## 2021-02-23 NOTE — ED Notes (Signed)
Pt 86% on RA. Pt placed on 4L [address], current O2 sat is 91% on 4L.

## 2021-02-24 ENCOUNTER — Inpatient Hospital Stay: Payer: Medicare Other

## 2021-02-24 ENCOUNTER — Encounter: Payer: Self-pay | Admitting: Internal Medicine

## 2021-02-24 DIAGNOSIS — I480 Paroxysmal atrial fibrillation: Secondary | ICD-10-CM

## 2021-02-24 DIAGNOSIS — J441 Chronic obstructive pulmonary disease with (acute) exacerbation: Secondary | ICD-10-CM | POA: Diagnosis not present

## 2021-02-24 DIAGNOSIS — Z720 Tobacco use: Secondary | ICD-10-CM | POA: Diagnosis not present

## 2021-02-24 DIAGNOSIS — J9601 Acute respiratory failure with hypoxia: Secondary | ICD-10-CM | POA: Diagnosis not present

## 2021-02-24 LAB — BASIC METABOLIC PANEL
Anion gap: 9 (ref 5–15)
BUN: 19 mg/dL (ref 8–23)
CO2: 32 mmol/L (ref 22–32)
Calcium: 9.4 mg/dL (ref 8.9–10.3)
Chloride: 102 mmol/L (ref 98–111)
Creatinine, Ser: 0.84 mg/dL (ref 0.61–1.24)
GFR, Estimated: >60 mL/min (ref >60)
Glucose, Bld: 111 mg/dL — ABNORMAL HIGH (ref 70–99)
Potassium: 3.9 mmol/L (ref 3.5–5.1)
Sodium: 143 mmol/L (ref 135–145)

## 2021-02-24 LAB — CBC
HCT: 45.7 % (ref 39.0–52.0)
Hemoglobin: 15.2 g/dL (ref 13.0–17.0)
MCH: 30 pg (ref 26.0–34.0)
MCHC: 33.3 g/dL (ref 30.0–36.0)
MCV: 90.3 fL (ref 80.0–100.0)
Platelets: 184 10*3/uL (ref 150–400)
RBC: 5.06 MIL/uL (ref 4.22–5.81)
RDW: 14.8 % (ref 11.5–15.5)
WBC: 14.5 10*3/uL — ABNORMAL HIGH (ref 4.0–10.5)
nRBC: 0 % (ref 0.0–0.2)

## 2021-02-24 MED ORDER — PREDNISONE 20 MG PO TABS: 40.0000 mg | ORAL_TABLET | Freq: Every day | ORAL | 0 refills | Status: AC

## 2021-02-24 MED ORDER — PREDNISONE 20 MG PO TABS
40.0000 mg | ORAL_TABLET | Freq: Every day | ORAL | Status: DC
  Administered 2021-02-24: 40 mg via ORAL
  Filled 2021-02-24: qty 2

## 2021-02-24 MED ORDER — FUROSEMIDE 10 MG/ML IJ SOLN
40.0000 mg | Freq: Once | INTRAMUSCULAR | Status: AC
  Administered 2021-02-24: 40 mg via INTRAVENOUS
  Filled 2021-02-24: qty 4

## 2021-02-24 MED ORDER — IPRATROPIUM-ALBUTEROL 0.5-2.5 (3) MG/3ML IN SOLN
3.0000 mL | Freq: Three times a day (TID) | RESPIRATORY_TRACT | Status: DC
  Administered 2021-02-24: 3 mL via RESPIRATORY_TRACT
  Filled 2021-02-24: qty 3

## 2021-02-24 MED ORDER — ALBUTEROL SULFATE HFA 108 (90 BASE) MCG/ACT IN AERS: 1.0000 | INHALATION_SPRAY | Freq: Four times a day (QID) | RESPIRATORY_TRACT | Status: DC | PRN

## 2021-02-24 NOTE — Evaluation (Signed)
Physical Therapy Evaluation Patient Details Name: Dennis Preston MRN: [id number] DOB: [birth year] Today's Date: 02/24/2021   History of Present Illness  70 year old gentleman referred for evaluation of elevated troponin.  Patient has known history of coronary artery disease, status post CABG x4.  His known COPD, on supplemental oxygen 2 L at night.  Presents with 2 to 3-day history of worsening shortness of breath with nonproductive cough consistent with COPD exacerbation.  Patient was brought to [facility] ED via EMS with hypoxia and oxygen saturation of 80%.  Treated with breather mask, intravenous steroids, 2 albuterol breathing treatments with clinical improvement.  Chest x-ray did not reveal any acute findings.  Admission labs notable for mildly elevated troponin (21, 130, 189).  ECG revealed sinus rhythm with PVCs without ischemic ST-T wave changes.  Clinical Impression  Patient received by PT supine in the bed A&Ox4 and willing to participate with PT interventions. Patient on 2L of oxygen via [place] with 95% SpO2 at rest. Pt reports at home he is normally in the low 90s without using his O2 during the day. Patient able to navigate 8 steps with unilateral rail and supervision with 2L O2 via [place] maintaining >90% SpO2 with activity. Completed ambulation c pt demonstrating a safe and normalized gait pattern without the use of O2. He ambulated 53ft with supervision maintaining SpO2 >90%, following a seated rest break pt completed 1106ft of ambulation without O2 demonstrating a decrease of SpO2 to 87% that rebounded to 94% following 30 sec seated rest break with PLB. Pt educated on the importance of PLB to maintain SpO2 readings, pt demonstrated and verbalized understanding of education. Pt is I with bed mobility, ambulation and stair negotiation at this time and requires no further skilled PT interventions.    Follow Up Recommendations No PT follow up    Equipment Recommendations  Cane    Recommendations for  Other Services       Precautions / Restrictions Precautions Precautions: None Restrictions Weight Bearing Restrictions: No      Mobility  Bed Mobility Overal bed mobility: Independent             General bed mobility comments: Pt able to complete all bed mobility safely and I Patient Response: Cooperative  Transfers Overall transfer level: Independent               General transfer comment: Pt completed sit<>stand transfer with confidence with good stability after coming to the standing position  Ambulation/Gait Ambulation/Gait assistance: Supervision Gait Distance (Feet): 140 Feet         General Gait Details: Pt ambulated with a normal gait pattern, for 10ft withou O2, maintained >90% SpO2 with seated rest break and PLB. Completed 156ft of amb without O2 with SpO2 dropping to 87%, but rebounded to 94% with seated rest break and PLB.  Stairs Stairs: Yes Stairs assistance: Modified independent (Device/Increase time) Stair Management: One rail Right Number of Stairs: 8 General stair comments: Pt navigated 8 steps with 2L O2 maintaining SpO2>90% safely and with no rest breaks needed  Wheelchair Mobility    Modified Rankin (Stroke Patients Only)       Balance Overall balance assessment: Independent                                           Pertinent Vitals/Pain Pain Assessment: No/denies pain    Home Living Family/patient expects to  be discharged to:: Private residence Living Arrangements: Spouse/significant other;Children Available Help at Discharge: Family;Available 24 hours/day Type of Home: House Home Access: Stairs to enter Entrance Stairs-Rails: Doctor, general practice of Steps: 4 Home Layout: One level Home Equipment: Cane - single point Additional Comments: Pt stated that he uses a scooter to get around if he feels it is too difficult for him to breath    Prior Function Level of Independence: Independent          Comments: Pt reports using 2L O2 at home PRN, ambulated without an AD and completing all iADLs and ADLs I     Hand Dominance   Dominant Hand: Right    Extremity/Trunk Assessment        Lower Extremity Assessment Lower Extremity Assessment: Overall WFL for tasks assessed    Cervical / Trunk Assessment Cervical / Trunk Assessment: Normal  Communication   Communication: No difficulties  Cognition Arousal/Alertness: Awake/alert Behavior During Therapy: WFL for tasks assessed/performed Overall Cognitive Status: Within Functional Limits for tasks assessed                                 General Comments: Pt is pleasant and cooperative. A & O x4.      General Comments General comments (skin integrity, edema, etc.): Pt able to ambulate and navigate changing of directions without LOB or instability noted    Exercises     Assessment/Plan    PT Assessment Patent does not need any further PT services  PT Problem List         PT Treatment Interventions      PT Goals (Current goals can be found in the Care Plan section)  Acute Rehab PT Goals Patient Stated Goal: Go home PT Goal Formulation: With patient Time For Goal Achievement: 03/10/21 Potential to Achieve Goals: Good    Frequency     Barriers to discharge        Co-evaluation               AM-PAC PT "6 Clicks" Mobility  Outcome Measure Help needed turning from your back to your side while in a flat bed without using bedrails?: None Help needed moving from lying on your back to sitting on the side of a flat bed without using bedrails?: None Help needed moving to and from a bed to a chair (including a wheelchair)?: None Help needed standing up from a chair using your arms (e.g., wheelchair or bedside chair)?: None Help needed to walk in hospital room?: None Help needed climbing 3-5 steps with a railing? : None 6 Click Score: 24    End of Session Equipment Utilized During Treatment:  Gait belt Activity Tolerance: Patient tolerated treatment well Patient left: in bed;with call bell/phone within reach Nurse Communication: Other (comment) (Response to assessment) PT Visit Diagnosis: Difficulty in walking, not elsewhere classified (R26.2)    Time: 0093-8182 PT Time Calculation (min) (ACUTE ONLY): 34 min   Charges:   PT Evaluation $PT Eval Low Complexity: 1 Low PT Treatments $Gait Training: 8-22 mins       Minette Headland, PT, DPT 02/24/21, 10:37 AM  Devoria Albe 02/24/2021, 10:30 AM

## 2021-02-24 NOTE — Discharge Summary (Addendum)
Physician Discharge Summary  Dennis Preston:[id number] DOB: [birth year] DOA: 02/23/2021  PCP: Barbette Reichmann, MD  Admit date: 02/23/2021 Discharge date: 02/24/2021  Discharge disposition: Home   Recommendations for Outpatient Follow-Up:   Follow-up with PCP in 1 week   Discharge Diagnosis:   Principal Problem:   COPD with acute exacerbation (HCC) Active Problems:   A-fib (HCC)   Coronary artery disease   Tobacco use   Acute respiratory failure (HCC)   Essential hypertension    Discharge Condition: Stable.  Diet recommendation:  Diet Order            Diet - low sodium heart healthy           Diet 2 gram sodium Room service appropriate? Yes; Fluid consistency: Thin  Diet effective now                   Code Status: Full Code     Hospital Course:   Dennis Preston is a 70 year old man with medical history significant for COPD, chronic hypoxic respiratory failure on 2 L/min of oxygen at night, CAD s/p CABG, chronic diastolic CHF, atrial fibrillation, tobacco use disorder, who presented to the hospital because of shortness of breath of about 1 weeks duration.  Reportedly, he was hypoxic with oxygen saturation of 80% when EMS picked him up.  He was placed on nonrebreather mask and was brought to the hospital for further management.  He was admitted to the hospital for COPD exacerbation and acute on chronic hypoxic respiratory failure.  He was treated with steroids and bronchodilators.  He also had mildly elevated troponins on admission.  Cardiologist was consulted because of his significant cardiac history.  Elevated troponins was thought to be due to demand ischemia.  He was able to ambulate in the hallway with physical therapist without any significant issues.  His condition has improved and he is deemed stable for discharge to home today.  He has been advised to stop smoking cigarettes.  Of note, patient was admitted as an inpatient.  However, his condition  improved rather quickly so he did not spend more than 2 midnights in the hospital.    Medical Consultants:    Cardiologist   Discharge Exam:    Vitals:   02/24/21 0342 02/24/21 0533 02/24/21 0828 02/24/21 1015  BP:  (!) 124/53 112/66   Pulse:  77 74 78  Resp:  20 16   Temp:  98.5 F (36.9 C) 97.9 F (36.6 C)   TempSrc:  Oral    SpO2: 97% 99% 95% 94%  Weight:      Height:         GEN: NAD SKIN: Warm and dry EYES: No pallor or icterus ENT: MMM CV: RRR PULM: CTA B ABD: soft, ND, NT, +BS CNS: AAO x 3, non focal EXT: No edema or tenderness   The results of significant diagnostics from this hospitalization (including imaging, microbiology, ancillary and laboratory) are listed below for reference.     Procedures and Diagnostic Studies:   DG Chest 1 View  Result Date: 02/24/2021 CLINICAL DATA:  Shortness of breath EXAM: CHEST  1 VIEW COMPARISON:  Yesterday FINDINGS: Emphysema with interstitial reticulation diffusely. There is no edema, consolidation, effusion, or pneumothorax. Normal heart size. CABG. IMPRESSION: COPD without acute superimposed finding. Electronically Signed   By: Marnee Spring M.D.   On: 02/24/2021 06:23   DG Chest 2 View  Result Date: 02/23/2021 CLINICAL DATA:  Shortness of breath EXAM:  CHEST - 2 VIEW COMPARISON:  09/06/2020 FINDINGS: Hyperinflation and interstitial coarsening from COPD. There is no edema, consolidation, effusion, or pneumothorax. Prior CABG. Normal heart size and stable mediastinal contours. IMPRESSION: COPD without acute superimposed finding. Electronically Signed   By: Marnee Spring M.D.   On: 02/23/2021 08:32     Labs:   Basic Metabolic Panel: Recent Labs  Lab 02/23/21 0723 02/24/21 0437  NA 137 143  K 4.3 3.9  CL 99 102  CO2 29 32  GLUCOSE 120* 111*  BUN 14 19  CREATININE 0.94 0.84  CALCIUM 8.8* 9.4   GFR Estimated Creatinine Clearance: 72.2 mL/min (by C-G formula based on SCr of 0.84 mg/dL). Liver Function  Tests: No results for input(s): AST, ALT, ALKPHOS, BILITOT, PROT, ALBUMIN in the last 168 hours. No results for input(s): LIPASE, AMYLASE in the last 168 hours. No results for input(s): AMMONIA in the last 168 hours. Coagulation profile No results for input(s): INR, PROTIME in the last 168 hours.  CBC: Recent Labs  Lab 02/23/21 0723 02/24/21 0437  WBC 10.2 14.5*  NEUTROABS 6.9  --   HGB 15.3 15.2  HCT 47.1 45.7  MCV 94.0 90.3  PLT 159 184   Cardiac Enzymes: No results for input(s): CKTOTAL, CKMB, CKMBINDEX, TROPONINI in the last 168 hours. BNP: Invalid input(s): POCBNP CBG: No results for input(s): GLUCAP in the last 168 hours. D-Dimer No results for input(s): DDIMER in the last 72 hours. Hgb A1c No results for input(s): HGBA1C in the last 72 hours. Lipid Profile No results for input(s): CHOL, HDL, LDLCALC, TRIG, CHOLHDL, LDLDIRECT in the last 72 hours. Thyroid function studies No results for input(s): TSH, T4TOTAL, T3FREE, THYROIDAB in the last 72 hours.  Invalid input(s): FREET3 Anemia work up No results for input(s): VITAMINB12, FOLATE, FERRITIN, TIBC, IRON, RETICCTPCT in the last 72 hours. Microbiology Recent Results (from the past 240 hour(s))  Resp Panel by RT-PCR (Flu A&B, Covid) Nasopharyngeal Swab     Status: None   Collection Time: 02/23/21  9:32 AM   Specimen: Nasopharyngeal Swab; Nasopharyngeal(NP) swabs in vial transport medium  Result Value Ref Range Status   SARS Coronavirus 2 by RT PCR NEGATIVE NEGATIVE Final    Comment: (NOTE) SARS-CoV-2 target nucleic acids are NOT DETECTED.  The SARS-CoV-2 RNA is generally detectable in upper respiratory specimens during the acute phase of infection. The lowest concentration of SARS-CoV-2 viral copies this assay can detect is 138 copies/mL. A negative result does not preclude SARS-Cov-2 infection and should not be used as the sole basis for treatment or other patient management decisions. A negative result may  occur with  improper specimen collection/handling, submission of specimen other than nasopharyngeal swab, presence of viral mutation(s) within the areas targeted by this assay, and inadequate number of viral copies(<138 copies/mL). A negative result must be combined with clinical observations, patient history, and epidemiological information. The expected result is Negative.  Fact Sheet for Patients:  BloggerCourse.com  Fact Sheet for Healthcare Providers:  SeriousBroker.it  This test is no t yet approved or cleared by the [place] FDA and  has been authorized for detection and/or diagnosis of SARS-CoV-2 by FDA under an Emergency Use Authorization (EUA). This EUA will remain  in effect (meaning this test can be used) for the duration of the COVID-19 declaration under Section 564(b)(1) of the Act, 21 U.S.C.section 360bbb-3(b)(1), unless the authorization is terminated  or revoked sooner.       Influenza A by PCR NEGATIVE NEGATIVE Final  Influenza B by PCR NEGATIVE NEGATIVE Final    Comment: (NOTE) The Xpert Xpress SARS-CoV-2/FLU/RSV plus assay is intended as an aid in the diagnosis of influenza from Nasopharyngeal swab specimens and should not be used as a sole basis for treatment. Nasal washings and aspirates are unacceptable for Xpert Xpress SARS-CoV-2/FLU/RSV testing.  Fact Sheet for Patients: BloggerCourse.com  Fact Sheet for Healthcare Providers: SeriousBroker.it  This test is not yet approved or cleared by the [place] FDA and has been authorized for detection and/or diagnosis of SARS-CoV-2 by FDA under an Emergency Use Authorization (EUA). This EUA will remain in effect (meaning this test can be used) for the duration of the COVID-19 declaration under Section 564(b)(1) of the Act, 21 U.S.C. section 360bbb-3(b)(1), unless the authorization is terminated  or revoked.  Performed at [facility], [address], [place], [place] [address]      Discharge Instructions:   Discharge Instructions    Diet - low sodium heart healthy   Complete by: As directed    Increase activity slowly   Complete by: As directed      Allergies as of 02/24/2021      Reactions   Chlorhexidine       Medication List    STOP taking these medications   zolpidem 5 MG tablet Commonly known as: AMBIEN     TAKE these medications   acetaminophen 325 MG tablet Commonly known as: TYLENOL Take 650 mg by mouth every 6 (six) hours as needed for mild pain or headache.   albuterol 108 (90 Base) MCG/ACT inhaler Commonly known as: VENTOLIN HFA Inhale 1-2 puffs into the lungs every 6 (six) hours as needed for wheezing or shortness of breath.   albuterol (2.5 MG/3ML) 0.083% nebulizer solution Commonly known as: PROVENTIL Take 3 mLs (2.5 mg total) by nebulization every 6 (six) hours as needed for wheezing or shortness of breath.   aspirin EC 81 MG tablet Take 1 tablet (81 mg total) by mouth daily. Swallow whole.   atorvastatin 80 MG tablet Commonly known as: LIPITOR Take 1 tablet (80 mg total) by mouth daily at 6 PM.   budesonide 0.5 MG/2ML nebulizer solution Commonly known as: PULMICORT Inhale into the lungs.   clopidogrel 75 MG tablet Commonly known as: PLAVIX Take 75 mg by mouth every evening.   furosemide 40 MG tablet Commonly known as: LASIX Take 40 mg by mouth daily.   predniSONE 20 MG tablet Commonly known as: DELTASONE Take 2 tablets (40 mg total) by mouth daily with breakfast for 3 days. Start taking on: Feb 25, 2021   traMADol 50 MG tablet Commonly known as: ULTRAM Take 1 tablet (50 mg total) by mouth every 6 (six) hours as needed.   Trelegy Ellipta 100-62.5-25 MCG/INH Aepb Generic drug: Fluticasone-Umeclidin-Vilant Inhale 1 puff into the lungs every other day.         Time coordinating discharge: 31  minutes  Signed:  Sakina Briones  [facility] 02/24/2021, 12:46 PM   Pager on [email or web address]. If 7PM-7AM, please contact night-coverage at [email or web address]

## 2021-02-24 NOTE — Progress Notes (Signed)
[facility] Cardiology  SUBJECTIVE: Patient laying in bed, denies chest pain.  He had an episode of shortness of breath last evening, resolved after nebulizer.   Vitals:   02/23/21 2101 02/24/21 0342 02/24/21 0533 02/24/21 0828  BP:   (!) 124/53 112/66  Pulse:   77 74  Resp:   20 16  Temp:   98.5 F (36.9 C) 97.9 F (36.6 C)  TempSrc:   Oral   SpO2: 100% 97% 99% 95%  Weight:      Height:         Intake/Output Summary (Last 24 hours) at 02/24/2021 0951 Last data filed at 02/24/2021 0425 Gross per 24 hour  Intake 237 ml  Output 350 ml  Net -113 ml      PHYSICAL EXAM  General: Well developed, well nourished, in no acute distress HEENT:  Normocephalic and atramatic Neck:  No JVD.  Lungs: Clear bilaterally to auscultation and percussion. Heart: HRRR . Normal S1 and S2 without gallops or murmurs.  Abdomen: Bowel sounds are positive, abdomen soft and non-tender  Msk:  Back normal, normal gait. Normal strength and tone for age. Extremities: No clubbing, cyanosis or edema.   Neuro: Alert and oriented X 3. Psych:  Good affect, responds appropriately   LABS: Basic Metabolic Panel: Recent Labs    02/23/21 0723 02/24/21 0437  NA 137 143  K 4.3 3.9  CL 99 102  CO2 29 32  GLUCOSE 120* 111*  BUN 14 19  CREATININE 0.94 0.84  CALCIUM 8.8* 9.4   Liver Function Tests: No results for input(s): AST, ALT, ALKPHOS, BILITOT, PROT, ALBUMIN in the last 72 hours. No results for input(s): LIPASE, AMYLASE in the last 72 hours. CBC: Recent Labs    02/23/21 0723 02/24/21 0437  WBC 10.2 14.5*  NEUTROABS 6.9  --   HGB 15.3 15.2  HCT 47.1 45.7  MCV 94.0 90.3  PLT 159 184   Cardiac Enzymes: No results for input(s): CKTOTAL, CKMB, CKMBINDEX, TROPONINI in the last 72 hours. BNP: Invalid input(s): POCBNP D-Dimer: No results for input(s): DDIMER in the last 72 hours. Hemoglobin A1C: No results for input(s): HGBA1C in the last 72 hours. Fasting Lipid Panel: No results for input(s):  CHOL, HDL, LDLCALC, TRIG, CHOLHDL, LDLDIRECT in the last 72 hours. Thyroid Function Tests: No results for input(s): TSH, T4TOTAL, T3FREE, THYROIDAB in the last 72 hours.  Invalid input(s): FREET3 Anemia Panel: No results for input(s): VITAMINB12, FOLATE, FERRITIN, TIBC, IRON, RETICCTPCT in the last 72 hours.  DG Chest 1 View  Result Date: 02/24/2021 CLINICAL DATA:  Shortness of breath EXAM: CHEST  1 VIEW COMPARISON:  Yesterday FINDINGS: Emphysema with interstitial reticulation diffusely. There is no edema, consolidation, effusion, or pneumothorax. Normal heart size. CABG. IMPRESSION: COPD without acute superimposed finding. Electronically Signed   By: Marnee Spring M.D.   On: 02/24/2021 06:23   DG Chest 2 View  Result Date: 02/23/2021 CLINICAL DATA:  Shortness of breath EXAM: CHEST - 2 VIEW COMPARISON:  09/06/2020 FINDINGS: Hyperinflation and interstitial coarsening from COPD. There is no edema, consolidation, effusion, or pneumothorax. Prior CABG. Normal heart size and stable mediastinal contours. IMPRESSION: COPD without acute superimposed finding. Electronically Signed   By: Marnee Spring M.D.   On: 02/23/2021 08:32     Echo   TELEMETRY: Sinus rhythm:  ASSESSMENT AND PLAN:  Principal Problem:   COPD with acute exacerbation (HCC) Active Problems:   A-fib (HCC)   Coronary artery disease   Tobacco use   Acute  respiratory failure (HCC)   Essential hypertension    1.  Elevated troponin (21, 130, 189), in the setting of COPD exacerbation, without chest pain, without ECG changes, most consistent with demand supply ischemia (I21.A1) not due to ACS 2.  Coronary artery disease, status post CABG x4, cardiac catheterization 01/23/2020 revealed severe three-vessel coronary artery disease, with patent LIMA to LAD, patent SVG to distal RCA, patent SVG to ramus intermedius branch, and occluded SVG to left circumflex, with LVEF 45 to 50% 3.  COPD exacerbation, clinically improved after  intravenous steroids and inhaler therapy 4.  Chronic diastolic congestive heart failure, on furosemide 40 mg p.o. daily, currently appears euvolemic 5.  Paroxysmal atrial fibrillation, not on chronic anticoagulation, currently in sinus rhythm  Recommendations  1.  Agree with current therapy 2.  Defer full dose anticoagulation 3.  Defer cardiac catheterization 4.  Continue dual antiplatelet therapy 5.  Continue high intensity atorvastatin 6.  Defer starting chronic anticoagulation to Dr. Juliann Pares as outpatient  Sign off for now, please call if any questions   Marcina Millard, MD, PhD, [facility] 02/24/2021 9:51 AM

## 2021-03-15 ENCOUNTER — Telehealth (INDEPENDENT_AMBULATORY_CARE_PROVIDER_SITE_OTHER): Payer: Self-pay | Admitting: Vascular Surgery

## 2021-03-15 NOTE — Telephone Encounter (Signed)
Unfortunately because the patient is so far post op, we are unable to send a refill of pain medication.  We are only able to send pain medication as it relates to surgical interventions. If he is having pain in his legs, it may be prudent to have him come in for ABIs to ensure it isn't related to his circulation.  He can also reach to his PCP if he having difficulty with sleeping to see if they can be of some assistance.

## 2021-03-15 NOTE — Telephone Encounter (Signed)
Made patient aware of NP's note

## 2021-03-15 NOTE — Telephone Encounter (Signed)
Called stating that he would like to have a refill of the tramadol 50mg . Patient states that his legs and neck hurt and he has a hard time sleeping at night. Patient was last seen 02/01/21 1 mth f/u endart. Carotid studies with FB. Please advise.

## 2021-05-10 ENCOUNTER — Encounter (INDEPENDENT_AMBULATORY_CARE_PROVIDER_SITE_OTHER): Payer: Medicare Other

## 2021-05-10 ENCOUNTER — Ambulatory Visit (INDEPENDENT_AMBULATORY_CARE_PROVIDER_SITE_OTHER): Payer: Medicare Other | Admitting: Nurse Practitioner

## 2021-06-17 ENCOUNTER — Other Ambulatory Visit (INDEPENDENT_AMBULATORY_CARE_PROVIDER_SITE_OTHER): Payer: Self-pay | Admitting: Nurse Practitioner

## 2021-06-17 DIAGNOSIS — I6523 Occlusion and stenosis of bilateral carotid arteries: Secondary | ICD-10-CM

## 2021-06-18 ENCOUNTER — Ambulatory Visit (INDEPENDENT_AMBULATORY_CARE_PROVIDER_SITE_OTHER): Payer: Medicare Other

## 2021-06-18 ENCOUNTER — Encounter (INDEPENDENT_AMBULATORY_CARE_PROVIDER_SITE_OTHER): Payer: Self-pay | Admitting: Vascular Surgery

## 2021-06-18 ENCOUNTER — Encounter (INDEPENDENT_AMBULATORY_CARE_PROVIDER_SITE_OTHER): Payer: Medicare Other

## 2021-06-18 ENCOUNTER — Ambulatory Visit (INDEPENDENT_AMBULATORY_CARE_PROVIDER_SITE_OTHER): Payer: Medicare Other | Admitting: Vascular Surgery

## 2021-06-18 ENCOUNTER — Other Ambulatory Visit: Payer: Self-pay

## 2021-06-18 VITALS — BP 146/72 | HR 75 | Resp 16 | Wt 160.0 lb

## 2021-06-18 DIAGNOSIS — I6523 Occlusion and stenosis of bilateral carotid arteries: Secondary | ICD-10-CM | POA: Diagnosis not present

## 2021-06-18 DIAGNOSIS — E785 Hyperlipidemia, unspecified: Secondary | ICD-10-CM | POA: Diagnosis not present

## 2021-06-18 DIAGNOSIS — I724 Aneurysm of artery of lower extremity: Secondary | ICD-10-CM

## 2021-06-18 DIAGNOSIS — I10 Essential (primary) hypertension: Secondary | ICD-10-CM

## 2021-06-18 DIAGNOSIS — I739 Peripheral vascular disease, unspecified: Secondary | ICD-10-CM | POA: Diagnosis not present

## 2021-06-18 DIAGNOSIS — I70229 Atherosclerosis of native arteries of extremities with rest pain, unspecified extremity: Secondary | ICD-10-CM

## 2021-06-18 NOTE — Assessment & Plan Note (Signed)
lipid control important in reducing the progression of atherosclerotic disease. Continue statin therapy  

## 2021-06-18 NOTE — Assessment & Plan Note (Signed)
On the left.  Status post emergent repair last year.

## 2021-06-18 NOTE — Assessment & Plan Note (Signed)
His ABIs today are 0.69 on the right and 1.1 on the left with digit pressures of 61 on the right and 100 on the left.  Symptoms overall significantly improved from last year prior to revascularization.  No role for intervention at this time.  Recheck in 6 months with noninvasive studies.  Continue current medical regimen.

## 2021-06-18 NOTE — Assessment & Plan Note (Signed)
blood pressure control important in reducing the progression of atherosclerotic disease. On appropriate oral medications.  

## 2021-06-18 NOTE — Progress Notes (Signed)
MRN : [id number]  Dennis Preston is a 70 y.o. ([birth year]) male who presents with chief complaint of  Chief Complaint  Patient presents with   Follow-up    Ultrasound follow up  .  History of Present Illness: Patient returns in follow-up of multiple vascular issues.  He is treated for both PAD and carotid disease.  He underwent a right carotid endarterectomy earlier this year.  He underwent extensive bilateral lower extremity revascularization as well as treatment for a pseudoaneurysm in the left femoral region last year.  He is doing well.  He still gets some leg pain with activity in both legs, but no rest pain or ulceration.  His ABIs today are 0.69 on the right and 1.1 on the left with digit pressures of 61 on the right and 100 on the left. He denies any focal neurologic symptoms. Specifically, the patient denies amaurosis fugax, speech or swallowing difficulties, or arm or leg weakness or numbness. Duplex today shows his right carotid endarterectomy site to be patent without significant recurrent stenosis.  He has some mild to moderate common carotid artery disease proximal to the endarterectomy, but this is stable.  Left carotid stenosis is in the 60 to 79% range  Current Outpatient Medications  Medication Sig Dispense Refill   acetaminophen (TYLENOL) 325 MG tablet Take 650 mg by mouth every 6 (six) hours as needed for mild pain or headache.     albuterol (PROVENTIL) (2.5 MG/3ML) 0.083% nebulizer solution Take 3 mLs (2.5 mg total) by nebulization every 6 (six) hours as needed for wheezing or shortness of breath. 360 mL 0   albuterol (VENTOLIN HFA) 108 (90 Base) MCG/ACT inhaler Inhale 1-2 puffs into the lungs every 6 (six) hours as needed for wheezing or shortness of breath. 8 g 0   atorvastatin (LIPITOR) 80 MG tablet Take 1 tablet (80 mg total) by mouth daily at 6 PM.     budesonide (PULMICORT) 0.5 MG/2ML nebulizer solution Inhale into the lungs.     carvedilol (COREG) 3.125 MG tablet  Take 3.125 mg by mouth 2 (two) times daily.     clopidogrel (PLAVIX) 75 MG tablet Take 75 mg by mouth every evening.      Fluticasone-Umeclidin-Vilant (TRELEGY ELLIPTA) 100-62.5-25 MCG/INH AEPB Inhale 1 puff into the lungs every other day.     furosemide (LASIX) 40 MG tablet Take 40 mg by mouth daily.      traMADol (ULTRAM) 50 MG tablet Take 1 tablet (50 mg total) by mouth every 6 (six) hours as needed. 30 tablet 0   zolpidem (AMBIEN) 5 MG tablet Take 5 mg by mouth at bedtime as needed.     No current facility-administered medications for this visit.    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, SVG-OM1, SVG-PDA, SVG-PL   Hyperlipemia    Hypertension    Leucocytosis    Liver dysfunction    Long term current use of clopidogrel    NSTEMI (non-ST elevated myocardial infarction) (HCC)    approx 2016   Peripheral artery disease (HCC)    legs   Tobacco abuse  Wears dentures    full upper and lower    Past Surgical History:  Procedure Laterality Date   APPLICATION OF WOUND VAC Right 06/22/2020   Procedure: APPLICATION OF WOUND VAC;  Surgeon: Annice Needy, MD;  Location: [facility];  Service: Vascular;  Laterality: Right;   CARDIAC CATHETERIZATION N/A 07/10/2015   Procedure: Right/Left Heart Cath and Coronary Angiography;  Surgeon: Alwyn Pea, MD;  Location: [facility];  Service: Cardiovascular;  Laterality: N/A;   CORONARY ARTERY BYPASS GRAFT  07/13/2015   [facility].  4 vessel   ENDARTERECTOMY Right 01/02/2021   Procedure: ENDARTERECTOMY CAROTID;  Surgeon: Annice Needy, MD;  Location: [facility];  Service: Vascular;  Laterality: Right;   ENDARTERECTOMY FEMORAL Bilateral  05/23/2020   Procedure: ENDARTERECTOMY FEMORAL BILATERAL SUPERFICIAL FEMORAL ARTERY STENTS ;  Surgeon: Renford Dills, MD;  Location: [facility];  Service: Vascular;  Laterality: Bilateral;   FALSE ANEURYSM REPAIR Left 07/22/2020   Procedure: REPAIR LEFT FEMORAL PSEUDOANUERYSM;  Surgeon: Bertram Denver, MD;  Location: [facility];  Service: Vascular;  Laterality: Left;   INSERTION OF ILIAC STENT Left 05/23/2020   Procedure: INSERTION OF ILIAC STENT;  Surgeon: Renford Dills, MD;  Location: [facility];  Service: Vascular;  Laterality: Left;   LEFT HEART CATH AND CORS/GRAFTS ANGIOGRAPHY N/A 01/23/2020   Procedure: LEFT HEART CATH AND CORS/GRAFTS ANGIOGRAPHY;  Surgeon: Alwyn Pea, MD;  Location: [facility];  Service: Cardiovascular;  Laterality: N/A;   LOWER EXTREMITY ANGIOGRAPHY Right 04/03/2020   Procedure: LOWER EXTREMITY ANGIOGRAPHY;  Surgeon: Renford Dills, MD;  Location: [facility];  Service: Cardiovascular;  Laterality: Right;   OLECRANON BURSECTOMY Left 05/06/2019   Procedure: OLECRANON BURSECTOMY AND DEBRIDEMENT;  Surgeon: Signa Kell, MD;  Location: [facility];  Service: Orthopedics;  Laterality: Left;   WOUND DEBRIDEMENT Right 06/22/2020   Procedure: DEBRIDEMENT WOUND RIGHT GROIN;  Surgeon: Annice Needy, MD;  Location: [facility];  Service: Vascular;  Laterality: Right;     Social History   Tobacco Use   Smoking status: Some Days    Packs/day: 0.50    Years: 54.00    Pack years: 27.00    Types: Cigarettes   Smokeless tobacco: Never   Tobacco comments:    was 2.5 PPD for 50 yrs until 2016, quit a "few days ago"  Vaping Use   Vaping Use: Never used  Substance Use Topics   Alcohol use: Not Currently    Comment: quit over 15 yrs ago   Drug use: Never       Family History  Problem Relation Age of Onset   Depression Sister   No bleeding or clotting disorders, no aneurysms  Allergies  Allergen Reactions   Chlorhexidine      REVIEW OF SYSTEMS  (Negative unless checked)  Constitutional: [] Weight loss  [] Fever  [] Chills Cardiac: [] Chest pain   [] Chest pressure   [] Palpitations   [] Shortness of breath when laying flat   [] Shortness of breath at rest   [] Shortness of breath with exertion. Vascular:  [x] Pain in legs with walking   [] Pain in legs at rest   [] Pain in legs when laying flat   [x] Claudication   [] Pain in feet when walking  [] Pain in feet at rest  [] Pain in feet when laying flat   [] History of DVT   [] Phlebitis   [] Swelling in legs   [] Varicose veins   [] Non-healing ulcers Pulmonary:   [] Uses home oxygen   [] Productive  cough   [] Hemoptysis   [] Wheeze  [x] COPD   [] Asthma Neurologic:  [] Dizziness  [] Blackouts   [] Seizures   [] History of stroke   [] History of TIA  [] Aphasia   [] Temporary blindness   [] Dysphagia   [] Weakness or numbness in arms   [] Weakness or numbness in legs Musculoskeletal:  [x] Arthritis   [] Joint swelling   [x] Joint pain   [] Low back pain Hematologic:  [] Easy bruising  [] Easy bleeding   [] Hypercoagulable state   [] Anemic  [] Hepatitis Gastrointestinal:  [] Blood in stool   [] Vomiting blood  [] Gastroesophageal reflux/heartburn   [] Difficulty swallowing. Genitourinary:  [] Chronic kidney disease   [] Difficult urination  [] Frequent urination  [] Burning with urination   [] Blood in urine Skin:  [] Rashes   [] Ulcers   [] Wounds Psychological:  [] History of anxiety   []  History of major depression.  Physical Examination  Vitals:   06/18/21 1356  BP: (!) 146/72  Pulse: 75  Resp: 16  Weight: 160 lb (72.6 kg)   Body mass index is 26.63 kg/m. Gen:  WD/WN, NAD Head: [place]/AT, No temporalis wasting. Ear/Nose/Throat: Hearing grossly intact, nares w/o erythema or drainage, trachea midline Eyes: Conjunctiva clear. Sclera non-icteric Neck: Supple.  Left carotid bruit  Pulmonary:  Good air movement, equal and clear to auscultation bilaterally.  Cardiac: Irregular Vascular:  Vessel Right Left  Radial Palpable Palpable                           PT 1+ Palpable 1+ Palpable  DP 1+ Palpable 2+ Palpable    Musculoskeletal: M/S 5/5 throughout.  No deformity or atrophy. Mild LE edema. Neurologic: CN 2-12 intact. Sensation grossly intact in extremities.  Symmetrical.  Speech is fluent. Motor exam as listed above. Psychiatric: Judgment intact, Mood & affect appropriate for pt's clinical situation. Dermatologic: No rashes or ulcers noted.  No cellulitis or open wounds.     CBC Lab Results  Component Value Date   WBC 14.5 (H) 02/24/2021   HGB 15.2 02/24/2021   HCT 45.7 02/24/2021   MCV 90.3 02/24/2021   PLT 184 02/24/2021    BMET    Component Value Date/Time   NA 143 02/24/2021 0437   K 3.9 02/24/2021 0437   CL 102 02/24/2021 0437   CO2 32 02/24/2021 0437   GLUCOSE 111 (H) 02/24/2021 0437   BUN 19 02/24/2021 0437   CREATININE 0.84 02/24/2021 0437   CALCIUM 9.4 02/24/2021 0437   GFRNONAA >60 02/24/2021 0437   GFRAA >60 06/22/2020 1036   CrCl cannot be calculated (Patient's most recent lab result is older than the maximum 21 days allowed.).  COAG Lab Results  Component Value Date   INR 1.1 12/27/2020   INR 1.1 07/24/2020   INR 1.2 07/23/2020    Radiology No results found.   Assessment/Plan Atherosclerosis of artery of extremity with rest pain (HCC) His ABIs today are 0.69 on the right and 1.1 on the left with digit pressures of 61 on the right and 100 on the left.  Symptoms overall significantly improved from last year prior to revascularization.  No role for intervention at this time.  Recheck in 6 months with noninvasive studies.  Continue current medical regimen.  Essential hypertension blood pressure control important in reducing the progression of atherosclerotic disease. On appropriate oral medications.   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  Status post emergent repair last year.  Hyperlipidemia lipid control important in reducing the progression of  atherosclerotic  disease. Continue statin therapy   Carotid atherosclerosis, bilateral Duplex today shows his right carotid endarterectomy site to be patent without significant recurrent stenosis.  He has some mild to moderate common carotid artery disease proximal to the endarterectomy, but this is stable.  Left carotid stenosis is in the 60 to 79% range.  Continue current medical regimen.  Recheck in 6 months.    [name], MD  06/18/2021 4:14 PM    This note was created with Dragon medical transcription system.  Any errors from dictation are purely unintentional

## 2021-06-18 NOTE — Assessment & Plan Note (Signed)
Duplex today shows his right carotid endarterectomy site to be patent without significant recurrent stenosis.  He has some mild to moderate common carotid artery disease proximal to the endarterectomy, but this is stable.  Left carotid stenosis is in the 60 to 79% range.  Continue current medical regimen.  Recheck in 6 months.

## 2021-08-20 ENCOUNTER — Other Ambulatory Visit: Payer: Self-pay | Admitting: *Deleted

## 2021-08-20 DIAGNOSIS — F17210 Nicotine dependence, cigarettes, uncomplicated: Secondary | ICD-10-CM

## 2021-08-20 DIAGNOSIS — Z87891 Personal history of nicotine dependence: Secondary | ICD-10-CM

## 2021-09-05 ENCOUNTER — Other Ambulatory Visit: Payer: Self-pay

## 2021-09-05 ENCOUNTER — Ambulatory Visit
Admission: RE | Admit: 2021-09-05 | Discharge: 2021-09-05 | Disposition: A | Discharge status: Home / Self Care | Payer: Medicare Other | Source: Ambulatory Visit | Attending: Acute Care | Admitting: Acute Care

## 2021-09-05 DIAGNOSIS — F17210 Nicotine dependence, cigarettes, uncomplicated: Secondary | ICD-10-CM | POA: Diagnosis present

## 2021-09-05 DIAGNOSIS — Z87891 Personal history of nicotine dependence: Secondary | ICD-10-CM | POA: Insufficient documentation

## 2021-09-17 ENCOUNTER — Encounter: Payer: Self-pay | Admitting: Pulmonary Disease

## 2021-09-17 ENCOUNTER — Ambulatory Visit (INDEPENDENT_AMBULATORY_CARE_PROVIDER_SITE_OTHER): Payer: Medicare Other | Admitting: Pulmonary Disease

## 2021-09-17 ENCOUNTER — Other Ambulatory Visit: Payer: Self-pay

## 2021-09-17 ENCOUNTER — Telehealth: Payer: Self-pay | Admitting: Pulmonary Disease

## 2021-09-17 DIAGNOSIS — F17210 Nicotine dependence, cigarettes, uncomplicated: Secondary | ICD-10-CM

## 2021-09-17 DIAGNOSIS — R911 Solitary pulmonary nodule: Secondary | ICD-10-CM

## 2021-09-17 DIAGNOSIS — Z87891 Personal history of nicotine dependence: Secondary | ICD-10-CM

## 2021-09-17 DIAGNOSIS — J449 Chronic obstructive pulmonary disease, unspecified: Secondary | ICD-10-CM

## 2021-09-17 NOTE — Progress Notes (Signed)
Subjective:    Patient ID: Dennis Preston, male    DOB: 17-Jul-1951, 70 y.o.   MRN: [id number] Chief Complaint  Patient presents with   pulmonary consult    Per Dr. Betsy Coder 09/05/2021. C/o sob with exertion and prod cough with clear sputum.     HPI Dennis Preston is a 70 year old current smoker who presents for evaluation of a lung nodule noted on lung cancer screening.  The patient actually follows with Dr. Ned Clines for issues with COPD which is classified as severe.  Dennis Preston was noted to have a new 6.7 mm lung nodule on the right upper lobe on most recent lung cancer screening CT performed on 8 December.  With regards to this nodule the patient is asymptomatic.  The patient has been classified as a lung RADS 4A.  We are asked to consider need for robotic bronchoscopy.  The patient is maintained on Trelegy and as needed albuterol for his COPD.  Dennis Preston unfortunately continues to smoke.  Dennis Preston has regular follow-ups with Dr. Meredeth Ide.  Dennis Preston does not endorse any fevers, chills or sweats.  No cough or sputum production.  No hemoptysis.  No weight loss or anorexia.   Review of Systems A 10 point review of systems was performed and it is as noted above otherwise negative.  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, SVG-OM1, SVG-PDA, SVG-PL   Hyperlipemia    Hypertension    Leucocytosis    Liver dysfunction    Long term current use of clopidogrel    NSTEMI (non-ST elevated myocardial infarction) (HCC)    approx 2016   Peripheral artery disease (HCC)    legs   Tobacco abuse    Wears dentures    full upper and lower   Past Surgical History:  Procedure Laterality Date   APPLICATION OF  WOUND VAC Right 06/22/2020   Procedure: APPLICATION OF WOUND VAC;  Surgeon: Annice Needy, MD;  Location: [facility];  Service: Vascular;  Laterality: Right;   CARDIAC CATHETERIZATION N/A 07/10/2015   Procedure: Right/Left Heart Cath and Coronary Angiography;  Surgeon: Alwyn Pea, MD;  Location: [facility];  Service: Cardiovascular;  Laterality: N/A;   CORONARY ARTERY BYPASS GRAFT  07/13/2015   [facility].  4 vessel   ENDARTERECTOMY Right 01/02/2021   Procedure: ENDARTERECTOMY CAROTID;  Surgeon: Annice Needy, MD;  Location: [facility];  Service: Vascular;  Laterality: Right;   ENDARTERECTOMY FEMORAL Bilateral 05/23/2020   Procedure: ENDARTERECTOMY FEMORAL BILATERAL SUPERFICIAL FEMORAL ARTERY STENTS ;  Surgeon: Renford Dills, MD;  Location: [facility];  Service: Vascular;  Laterality: Bilateral;   FALSE ANEURYSM REPAIR Left 07/22/2020   Procedure: REPAIR LEFT FEMORAL PSEUDOANUERYSM;  Surgeon: Bertram Denver, MD;  Location: [facility];  Service: Vascular;  Laterality: Left;   INSERTION OF ILIAC STENT Left 05/23/2020   Procedure: INSERTION OF ILIAC STENT;  Surgeon: Renford Dills, MD;  Location: [facility];  Service: Vascular;  Laterality: Left;   LEFT HEART CATH AND CORS/GRAFTS ANGIOGRAPHY N/A 01/23/2020   Procedure: LEFT HEART CATH AND CORS/GRAFTS ANGIOGRAPHY;  Surgeon: Alwyn Pea,  MD;  Location: [facility];  Service: Cardiovascular;  Laterality: N/A;   LOWER EXTREMITY ANGIOGRAPHY Right 04/03/2020   Procedure: LOWER EXTREMITY ANGIOGRAPHY;  Surgeon: Renford Dills, MD;  Location: [facility];  Service: Cardiovascular;  Laterality: Right;   OLECRANON BURSECTOMY Left 05/06/2019   Procedure: OLECRANON BURSECTOMY AND DEBRIDEMENT;  Surgeon: Signa Kell, MD;  Location: [facility];  Service: Orthopedics;  Laterality: Left;   WOUND DEBRIDEMENT Right 06/22/2020   Procedure: DEBRIDEMENT WOUND RIGHT GROIN;  Surgeon: Annice Needy, MD;  Location: [facility];  Service: Vascular;   Laterality: Right;   Family History  Problem Relation Age of Onset   Depression Sister    Social History   Tobacco Use   Smoking status: Some Days    Packs/day: 2.50    Years: 54.00    Pack years: 135.00    Types: Cigarettes   Smokeless tobacco: Never   Tobacco comments:    0.5PPD 09/17/21  Substance Use Topics   Alcohol use: Not Currently    Comment: quit over 15 yrs ago   Allergies  Allergen Reactions   Chlorhexidine    Current Meds  Medication Sig   acetaminophen (TYLENOL) 325 MG tablet Take 650 mg by mouth every 6 (six) hours as needed for mild pain or headache.   albuterol (PROVENTIL) (2.5 MG/3ML) 0.083% nebulizer solution Take 3 mLs (2.5 mg total) by nebulization every 6 (six) hours as needed for wheezing or shortness of breath.   albuterol (VENTOLIN HFA) 108 (90 Base) MCG/ACT inhaler Inhale 1-2 puffs into the lungs every 6 (six) hours as needed for wheezing or shortness of breath.   atorvastatin (LIPITOR) 80 MG tablet Take 1 tablet (80 mg total) by mouth daily at 6 PM.   budesonide (PULMICORT) 0.5 MG/2ML nebulizer solution Inhale into the lungs.   carvedilol (COREG) 3.125 MG tablet Take 3.125 mg by mouth 2 (two) times daily.   clopidogrel (PLAVIX) 75 MG tablet Take 75 mg by mouth every evening.    Fluticasone-Umeclidin-Vilant (TRELEGY ELLIPTA) 100-62.5-25 MCG/INH AEPB Inhale 1 puff into the lungs every other day.   furosemide (LASIX) 40 MG tablet Take 40 mg by mouth daily.    traMADol (ULTRAM) 50 MG tablet Take 1 tablet (50 mg total) by mouth every 6 (six) hours as needed.   zolpidem (AMBIEN) 5 MG tablet Take 5 mg by mouth at bedtime as needed.   Immunization History  Administered Date(s) Administered   Influenza Inj Mdck Quad Pf 08/08/2019, 08/16/2020   Influenza Whole 08/27/2006   Influenza, High Dose Seasonal PF 08/08/2021   PFIZER(Purple Top)SARS-COV-2 Vaccination 11/07/2019, 11/30/2019   Pfizer Covid-19 Vaccine Bivalent Booster 38yrs & up 09/28/2020    Pneumococcal Polysaccharide-23 08/06/2018       Objective:   Physical Exam BP (!) 142/82 (BP Location: Left Arm, Cuff Size: Normal)   Pulse 85   Temp 97.6 F (36.4 C) (Temporal)   Ht 5\' 5"  (1.651 m)   Wt 162 lb 6.4 oz (73.7 kg)   SpO2 95%   BMI 27.02 kg/m  GENERAL: Well-developed, well-nourished gentleman, fully ambulatory.  No acute distress. HEAD: Normocephalic, atraumatic.  EYES: Pupils equal, round, reactive to light.  No scleral icterus.  MOUTH: Nose/mouth/throat not examined due to institutional masking requirements. NECK: Supple. No thyromegaly. Trachea midline. No JVD.  No adenopathy. PULMONARY: Good air entry bilaterally.  Coarse, otherwise, no adventitious sounds. CARDIOVASCULAR: S1 and S2. Regular rate and rhythm.  No rubs, murmurs or gallops heard. ABDOMEN: Benign. MUSCULOSKELETAL:  No joint deformity, no clubbing, no edema.  NEUROLOGIC: No overt focal deficit, no gait disturbance, speech is fluent. SKIN: Intact,warm,dry. PSYCH: Mood and behavior normal.  Representative image from CT performed 05 September 2021 showing the 6.7 mm nodule in the right upper lobe (arrow):     Assessment & Plan:     ICD-10-CM   1. Lung nodule seen on imaging study  R91.1    6.7 mm, right upper lobe Recommend follow-up CT at 3 months Will arrange for CT through lung cancer screening program Follow-up after that CT    2. Stage 3 severe COPD by GOLD classification (HCC)  J44.9    Continue follow-up with Dr. Meredeth Ide Continue Trelegy/albuterol    3. Tobacco dependence due to cigarettes  F17.210    Patient was counseled regards to discontinuation of smoking Total counseling time 3 to 5 minutes     Will see the patient in follow-up after CT scan of the chest performed in 3 months.  Dennis Preston is to contact us prior to that time should any new difficulties arise.  Gailen Shelter, MD Advanced Bronchoscopy PCCM [facility]    *This note was dictated using voice  recognition software/Dragon.  Despite best efforts to proofread, errors can occur which can change the meaning. Any transcriptional errors that result from this process are unintentional and may not be fully corrected at the time of dictation.

## 2021-09-17 NOTE — Telephone Encounter (Signed)
Per LG, patient needs f/u low dose in 31mo.   Routing to lung nodule pool.

## 2021-09-17 NOTE — Patient Instructions (Signed)
We will make sure that you get a follow-up film in 3 months time from your CT this year.  The spot you have is a little larger than the size of a pencil eraser.  We will continue to observe this as above.  Please make an effort to quit smoking this will help reduce the likelihood of developing more nodules.  We will see you in follow-up after your scan is done in 3 months.  For reference this is the size of your nodule noted on the diagram by the arrow:

## 2021-09-18 NOTE — Telephone Encounter (Signed)
Order placed for 3 mth nodule f/u low dose CT. Pt will be contacted closer to that time to schedule.

## 2021-10-13 ENCOUNTER — Encounter: Payer: Self-pay | Admitting: Emergency Medicine

## 2021-10-13 ENCOUNTER — Emergency Department
Admission: EM | Admit: 2021-10-13 | Discharge: 2021-10-13 | Disposition: A | Discharge status: Home / Self Care | Payer: Medicare Other | Attending: Emergency Medicine | Admitting: Emergency Medicine

## 2021-10-13 ENCOUNTER — Other Ambulatory Visit: Payer: Self-pay

## 2021-10-13 DIAGNOSIS — I509 Heart failure, unspecified: Secondary | ICD-10-CM | POA: Insufficient documentation

## 2021-10-13 DIAGNOSIS — Z955 Presence of coronary angioplasty implant and graft: Secondary | ICD-10-CM | POA: Diagnosis not present

## 2021-10-13 DIAGNOSIS — I2510 Atherosclerotic heart disease of native coronary artery without angina pectoris: Secondary | ICD-10-CM | POA: Insufficient documentation

## 2021-10-13 DIAGNOSIS — K644 Residual hemorrhoidal skin tags: Secondary | ICD-10-CM | POA: Insufficient documentation

## 2021-10-13 DIAGNOSIS — J449 Chronic obstructive pulmonary disease, unspecified: Secondary | ICD-10-CM | POA: Diagnosis not present

## 2021-10-13 DIAGNOSIS — N501 Vascular disorders of male genital organs: Secondary | ICD-10-CM | POA: Diagnosis present

## 2021-10-13 DIAGNOSIS — I4891 Unspecified atrial fibrillation: Secondary | ICD-10-CM | POA: Insufficient documentation

## 2021-10-13 DIAGNOSIS — I110 Hypertensive heart disease with heart failure: Secondary | ICD-10-CM | POA: Insufficient documentation

## 2021-10-13 LAB — CBC
HCT: 51.7 % (ref 39.0–52.0)
Hemoglobin: 17.2 g/dL — ABNORMAL HIGH (ref 13.0–17.0)
MCH: 31.3 pg (ref 26.0–34.0)
MCHC: 33.3 g/dL (ref 30.0–36.0)
MCV: 94.2 fL (ref 80.0–100.0)
Platelets: 190 10*3/uL (ref 150–400)
RBC: 5.49 MIL/uL (ref 4.22–5.81)
RDW: 14.6 % (ref 11.5–15.5)
WBC: 10.3 10*3/uL (ref 4.0–10.5)
nRBC: 0 % (ref 0.0–0.2)

## 2021-10-13 LAB — BASIC METABOLIC PANEL
Anion gap: 8 (ref 5–15)
BUN: 14 mg/dL (ref 8–23)
CO2: 30 mmol/L (ref 22–32)
Calcium: 8.7 mg/dL — ABNORMAL LOW (ref 8.9–10.3)
Chloride: 98 mmol/L (ref 98–111)
Creatinine, Ser: 0.86 mg/dL (ref 0.61–1.24)
GFR, Estimated: >60 mL/min (ref >60)
Glucose, Bld: 100 mg/dL — ABNORMAL HIGH (ref 70–99)
Potassium: 4.1 mmol/L (ref 3.5–5.1)
Sodium: 136 mmol/L (ref 135–145)

## 2021-10-13 NOTE — ED Triage Notes (Signed)
Pt via POV from home. Pt c/o bleeding from his scrotum since this AM around 8:00. Denies any pain. Blood is dark in nature. Denies urinary symptoms. Denies any scrotal surgeries. Denies injury. Pt is on blood thinners. Pt is A&OX4 and NAD.

## 2021-10-13 NOTE — ED Provider Notes (Signed)
[facility] Provider Note    Event Date/Time   First MD Initiated Contact with Patient 10/13/21 1633     (approximate)   History   Groin Injury   HPI  Dennis Preston is a 71 y.o. male  with pmh of CAD, NSTEMI, peripheral artery disease, hyperlipidemia, hypertension who presents with bleeding from his scrotum.  Patient notes that he has had bumps on his scrotum ever since he had to have the scrotum shaved prior to a catheterization procedure.  Today he felt like one of the bumps burst and he started having bleeding from the scrotum.  Apparently was a significant amount of blood.  Since being in the department the bleeding has stopped.     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, SVG-OM1, SVG-PDA, SVG-PL   Hyperlipemia    Hypertension    Leucocytosis    Liver dysfunction    Long term current use of clopidogrel    NSTEMI (non-ST elevated myocardial infarction) (HCC)    approx 2016   Peripheral artery disease (HCC)    legs   Tobacco abuse    Wears dentures    full upper and lower    Patient Active Problem List   Diagnosis Date Noted   S/P carotid endarterectomy 01/16/2021   Carotid stenosis, right 01/02/2021   Essential hypertension 12/18/2020   Hyperlipidemia 12/18/2020   Acute respiratory failure (HCC) 09/06/2020   CHF (congestive heart failure) (HCC)    Hypokalemia    Pseudoaneurysm of femoral artery (HCC) 07/22/2020   Atherosclerosis of artery of extremity with rest pain (HCC) 05/23/2020   Chronic venous insufficiency 03/01/2020   COPD exacerbation (HCC) 02/13/2020   Aortic atherosclerosis (HCC) 08/08/2019   COPD (chronic  obstructive pulmonary disease) with chronic bronchitis (HCC) 08/08/2019   COPD with acute exacerbation (HCC) 06/13/2018   Tobacco use 01/14/2017   A-fib (HCC) 07/18/2015   Coagulopathy (HCC) 07/17/2015   Hypotension 07/14/2015   Leukocytosis 07/14/2015   PAD (peripheral artery disease) (HCC) 07/14/2015   S/P coronary artery bypass graft x 4 07/14/2015   Carotid atherosclerosis, bilateral 07/12/2015   Transient visual loss of right eye 07/11/2015   NSTEMI (non-ST elevated myocardial infarction) (HCC) 07/10/2015   Amaurosis fugax 07/10/2015   Coronary artery disease 07/10/2015   Hypoxia 07/09/2015   ANXIETY 02/24/2007   ERECTILE DYSFUNCTION 02/24/2007   LIVER FUNCTION TESTS, ABNORMAL 02/24/2007     Physical Exam  Triage Vital Signs: ED Triage Vitals  Enc Vitals Group     BP 10/13/21 1223 (!) 142/98     Pulse Rate 10/13/21 1223 70     Resp 10/13/21 1223 20     Temp 10/13/21 1223 (!) 97.5 F (36.4 C)     Temp Source 10/13/21 1223 Oral     SpO2 10/13/21 1223 95 %     Weight 10/13/21 1224 160 lb (72.6 kg)     Height 10/13/21 1224 5\' 6"  (1.676 m)     Head Circumference --      Peak Flow --      Pain Score 10/13/21 1224 0  Pain Loc --      Pain Edu? --      Excl. in GC? --     Most recent vital signs: Vitals:   10/13/21 1223  BP: (!) 142/98  Pulse: 70  Resp: 20  Temp: (!) 97.5 F (36.4 C)  SpO2: 95%     General: Awake, no distress.  CV:  Good peripheral perfusion.  Resp:  Normal effort.  Abd:  No distention.  Neuro:             Awake, Alert, Oriented x 3  Other:  Scrotum with multiple skin tags, no obvious bleeding, no open wound, dried blood on his pants   ED Results / Procedures / Treatments  Labs (all labs ordered are listed, but only abnormal results are displayed) Labs Reviewed  CBC - Abnormal; Notable for the following components:      Result Value   Hemoglobin 17.2 (*)    All other components within normal limits  BASIC METABOLIC PANEL -  Abnormal; Notable for the following components:   Glucose, Bld 100 (*)    Calcium 8.7 (*)    All other components within normal limits  URINALYSIS, COMPLETE (UACMP) WITH MICROSCOPIC     EKG    RADIOLOGY    PROCEDURES:  Critical Care performed: No  Procedures  The patient is on the cardiac monitor to evaluate for evidence of arrhythmia and/or significant heart rate changes.   MEDICATIONS ORDERED IN ED: Medications - No data to display   IMPRESSION / MDM / ASSESSMENT AND PLAN / ED COURSE  I reviewed the triage vital signs and the nursing notes.                              Differential diagnosis includes, but is not limited to, bleeding skin tag  71 year old male presenting with bleeding from his scrotum.  Says that he has had "bumps" on his scrotum since having his scrotum shaved prior to a catheterization procedure.  On exam he looks to have skin tags diffusely.  Suspect that 1 of these must have gotten ripped off and that was the source of his bleeding.  Does have evidence of prior bleeding with dried blood in his pants but there is no active bleeding or any open wound that I can appreciate.  Nothing for me to intervene on.  His hemoglobin is stable normal platelets.  He is on Plavix.  Given he has been in the ER for several hours with no recurrent bleeding I think he is appropriate for discharge.  We discussed that if this recurs that he placed direct digital pressure with a single finger note constant pressure for 10 minutes, if bleeding not stop then come to the emergency department.       FINAL CLINICAL IMPRESSION(S) / ED DIAGNOSES   Final diagnoses:  Skin tag of perianal region     Rx / DC Orders   ED Discharge Orders     None        Note:  This document was prepared using Dragon voice recognition software and may include unintentional dictation errors.   Georga Hacking, MD 10/13/21 1728

## 2021-10-13 NOTE — Discharge Instructions (Signed)
Your bleeding is likely from a skin tag that was ripped off. If you have any return of bleeding please fold constant pressure over the site of bleeding with a single finger for at least 10 minutes. If the bleeding does not stop, please return to the emergency department.

## 2021-12-17 ENCOUNTER — Ambulatory Visit (INDEPENDENT_AMBULATORY_CARE_PROVIDER_SITE_OTHER): Payer: Medicare Other | Admitting: Vascular Surgery

## 2021-12-17 ENCOUNTER — Encounter (INDEPENDENT_AMBULATORY_CARE_PROVIDER_SITE_OTHER): Payer: Medicare Other

## 2021-12-19 ENCOUNTER — Other Ambulatory Visit (HOSPITAL_COMMUNITY): Payer: Self-pay | Admitting: Specialist

## 2021-12-19 ENCOUNTER — Other Ambulatory Visit: Payer: Self-pay | Admitting: Specialist

## 2021-12-19 DIAGNOSIS — J449 Chronic obstructive pulmonary disease, unspecified: Secondary | ICD-10-CM

## 2021-12-19 DIAGNOSIS — J849 Interstitial pulmonary disease, unspecified: Secondary | ICD-10-CM

## 2021-12-26 ENCOUNTER — Ambulatory Visit: Admission: RE | Admit: 2021-12-26 | Payer: Medicare Other | Source: Ambulatory Visit

## 2022-01-03 ENCOUNTER — Other Ambulatory Visit: Payer: Self-pay

## 2022-01-03 ENCOUNTER — Ambulatory Visit
Admission: RE | Admit: 2022-01-03 | Discharge: 2022-01-03 | Disposition: A | Discharge status: Home / Self Care | Payer: Medicare Other | Source: Ambulatory Visit | Attending: Specialist | Admitting: Specialist

## 2022-01-03 ENCOUNTER — Other Ambulatory Visit: Payer: Self-pay | Admitting: Specialist

## 2022-01-03 DIAGNOSIS — Z122 Encounter for screening for malignant neoplasm of respiratory organs: Secondary | ICD-10-CM | POA: Insufficient documentation

## 2022-01-03 DIAGNOSIS — J449 Chronic obstructive pulmonary disease, unspecified: Secondary | ICD-10-CM

## 2022-01-03 DIAGNOSIS — J849 Interstitial pulmonary disease, unspecified: Secondary | ICD-10-CM | POA: Diagnosis present

## 2022-01-03 DIAGNOSIS — F17210 Nicotine dependence, cigarettes, uncomplicated: Secondary | ICD-10-CM | POA: Diagnosis not present

## 2022-01-03 DIAGNOSIS — J439 Emphysema, unspecified: Secondary | ICD-10-CM | POA: Diagnosis not present

## 2022-01-03 DIAGNOSIS — I700 Atherosclerosis of aorta: Secondary | ICD-10-CM | POA: Diagnosis not present

## 2022-01-08 ENCOUNTER — Other Ambulatory Visit: Payer: Self-pay | Admitting: Specialist

## 2022-01-08 DIAGNOSIS — R918 Other nonspecific abnormal finding of lung field: Secondary | ICD-10-CM

## 2022-02-04 ENCOUNTER — Ambulatory Visit (INDEPENDENT_AMBULATORY_CARE_PROVIDER_SITE_OTHER): Payer: Medicare Other

## 2022-02-04 ENCOUNTER — Encounter (INDEPENDENT_AMBULATORY_CARE_PROVIDER_SITE_OTHER): Payer: Self-pay | Admitting: Vascular Surgery

## 2022-02-04 ENCOUNTER — Ambulatory Visit (INDEPENDENT_AMBULATORY_CARE_PROVIDER_SITE_OTHER): Payer: Medicare Other | Admitting: Vascular Surgery

## 2022-02-04 VITALS — BP 157/67 | HR 54 | Resp 17 | Ht 65.0 in | Wt 155.0 lb

## 2022-02-04 DIAGNOSIS — I10 Essential (primary) hypertension: Secondary | ICD-10-CM | POA: Diagnosis not present

## 2022-02-04 DIAGNOSIS — I724 Aneurysm of artery of lower extremity: Secondary | ICD-10-CM | POA: Diagnosis not present

## 2022-02-04 DIAGNOSIS — E785 Hyperlipidemia, unspecified: Secondary | ICD-10-CM

## 2022-02-04 DIAGNOSIS — I70229 Atherosclerosis of native arteries of extremities with rest pain, unspecified extremity: Secondary | ICD-10-CM

## 2022-02-04 DIAGNOSIS — I6523 Occlusion and stenosis of bilateral carotid arteries: Secondary | ICD-10-CM

## 2022-02-04 NOTE — Progress Notes (Signed)
? ? ?MRN : [id number] ? ?Dennis Preston is a 71 y.o. (1951-08-30) male who presents with chief complaint of No chief complaint on file. ?. ? ?History of Present Illness: Patient returns today in follow up of multiple vascular issues.  He is doing well today without any specific complaints.  He denies any focal neurologic symptoms. Specifically, the patient denies amaurosis fugax, speech or swallowing difficulties, or arm or leg weakness or numbness. Carotid duplex today reveals a widely patent right carotid endarterectomy without significant recurrent stenosis and stable 40 to 59% left ICA stenosis without progression. ?He is also followed for severe peripheral arterial disease and is required multiple previous surgeries and interventions.  He is currently doing well without any limb threatening symptoms of rest pain, ulceration, or gangrenous changes.  He has some mild claudication symptoms but nothing that is significant or lifestyle limiting at this time.  His ABIs today are 0.89 on the right and 0.98 on the left with biphasic waveforms and digit pressures of 90 on the right and 118 on the left. ? ?Current Outpatient Medications  ?Medication Sig Dispense Refill  ? acetaminophen (TYLENOL) 325 MG tablet Take 650 mg by mouth every 6 (six) hours as needed for mild pain or headache.    ? albuterol (PROVENTIL) (2.5 MG/3ML) 0.083% nebulizer solution Take 3 mLs (2.5 mg total) by nebulization every 6 (six) hours as needed for wheezing or shortness of breath. 360 mL 0  ? albuterol (VENTOLIN HFA) 108 (90 Base) MCG/ACT inhaler Inhale 1-2 puffs into the lungs every 6 (six) hours as needed for wheezing or shortness of breath. 8 g 0  ? atorvastatin (LIPITOR) 80 MG tablet Take 1 tablet (80 mg total) by mouth daily at 6 PM.    ? carvedilol (COREG) 3.125 MG tablet Take 3.125 mg by mouth 2 (two) times daily.    ? clopidogrel (PLAVIX) 75 MG tablet Take 75 mg by mouth every evening.     ? Fluticasone-Umeclidin-Vilant (TRELEGY  ELLIPTA) 100-62.5-25 MCG/INH AEPB Inhale 1 puff into the lungs every other day.    ? furosemide (LASIX) 40 MG tablet Take 40 mg by mouth daily.     ? traMADol (ULTRAM) 50 MG tablet Take 1 tablet (50 mg total) by mouth every 6 (six) hours as needed. 30 tablet 0  ? zolpidem (AMBIEN) 5 MG tablet Take 5 mg by mouth at bedtime as needed.    ? ?No current facility-administered medications for this visit.  ? ? ?Past Medical History:  ?Diagnosis Date  ? Alcohol abuse   ? Amaurosis fugax   ? Aortic atherosclerosis ([place])   ? Atrial fibrillation ([place])   ? Carotid stenosis   ? CHF (congestive heart failure) ([place])   ? Coagulopathy ([place])   ? COPD (chronic obstructive pulmonary disease) ([place])   ? Coronary artery disease   ? Depression   ? 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 LIMA-LAD, SVG-OM1, SVG-PDA, SVG-PL  ? Hyperlipemia   ? Hypertension   ? Leucocytosis   ? Liver dysfunction   ? Long term current use of clopidogrel   ? NSTEMI (non-ST elevated myocardial infarction) ([place])   ? approx 2016  ? Peripheral artery disease ([place])   ? legs  ? Tobacco abuse   ? Wears dentures   ? full upper and lower  ? ? ?Past Surgical History:  ?Procedure Laterality Date  ? APPLICATION OF WOUND VAC Right 06/22/2020  ? Procedure: APPLICATION OF WOUND VAC;  Surgeon: Algernon Huxley, MD;  Location: [facility];  Service: Vascular;  Laterality: Right;  ? CARDIAC CATHETERIZATION N/A 07/10/2015  ? Procedure: Right/Left Heart Cath and Coronary Angiography;  Surgeon: Yolonda Kida, MD;  Location: [place] [facility];  Service: Cardiovascular;  Laterality: N/A;  ? CORONARY ARTERY BYPASS GRAFT  07/13/2015  ? [facility].  4 vessel  ? ENDARTERECTOMY Right 01/02/2021  ? Procedure: ENDARTERECTOMY CAROTID;  Surgeon: Algernon Huxley, MD;  Location: [facility];  Service: Vascular;  Laterality: Right;  ? ENDARTERECTOMY FEMORAL Bilateral 05/23/2020  ? Procedure: ENDARTERECTOMY FEMORAL BILATERAL SUPERFICIAL FEMORAL ARTERY STENTS ;  Surgeon: Katha Cabal, MD;  Location: [facility];  Service:  Vascular;  Laterality: Bilateral;  ? FALSE ANEURYSM REPAIR Left 07/22/2020  ? Procedure: REPAIR LEFT FEMORAL PSEUDOANUERYSM;  Surgeon: Evaristo Bury, MD;  Location: [facility];  Service: Vascular;  Laterality: Left;  ? INSERTION OF ILIAC STENT Left 05/23/2020  ? Procedure: INSERTION OF ILIAC STENT;  Surgeon: Katha Cabal, MD;  Location: [facility];  Service: Vascular;  Laterality: Left;  ? LEFT HEART CATH AND CORS/GRAFTS ANGIOGRAPHY N/A 01/23/2020  ? Procedure: LEFT HEART CATH AND CORS/GRAFTS ANGIOGRAPHY;  Surgeon: Yolonda Kida, MD;  Location: [place] [facility];  Service: Cardiovascular;  Laterality: N/A;  ? LOWER EXTREMITY ANGIOGRAPHY Right 04/03/2020  ? Procedure: LOWER EXTREMITY ANGIOGRAPHY;  Surgeon: Katha Cabal, MD;  Location: [place] [facility];  Service: Cardiovascular;  Laterality: Right;  ? OLECRANON BURSECTOMY Left 05/06/2019  ? Procedure: OLECRANON BURSECTOMY AND DEBRIDEMENT;  Surgeon: Leim Fabry, MD;  Location: [facility];  Service: Orthopedics;  Laterality: Left;  ? WOUND DEBRIDEMENT Right 06/22/2020  ? Procedure: DEBRIDEMENT WOUND RIGHT GROIN;  Surgeon: Algernon Huxley, MD;  Location: [facility];  Service: Vascular;  Laterality: Right;  ? ? ?  ?Social History  ?  ?     ?Tobacco Use  ? Smoking status: Some Days  ?    Packs/day: 0.50  ?    Years: 54.00  ?    Pack years: 27.00  ?    Types: Cigarettes  ? Smokeless tobacco: Never  ? Tobacco comments:  ?    was 2.5 PPD for 50 yrs until 2016, quit a "few days ago"  ?Vaping Use  ? Vaping Use: Never used  ?Substance Use Topics  ? Alcohol use: Not Currently  ?    Comment: quit over 15 yrs ago  ? Drug use: Never  ?  ?  ?  ?  ?     ?Family History  ?Problem Relation Age of Onset  ? Depression Sister    ?No bleeding or clotting disorders, no aneurysms ?  ?    ?Allergies  ?Allergen Reactions  ? Chlorhexidine    ?  ?  ?  ?REVIEW OF SYSTEMS (Negative unless checked) ?  ?Constitutional: [] Weight loss  [] Fever  [] Chills ?Cardiac: [] Chest pain   [] Chest  pressure   [] Palpitations   [] Shortness of breath when laying flat   [] Shortness of breath at rest   [] Shortness of breath with exertion. ?Vascular:  [x] Pain in legs with walking   [] Pain in legs at rest   [] Pain in legs when laying flat   [x] Claudication   [] Pain in feet when walking  [] Pain in feet at rest  [] Pain in feet when laying flat   [] History of DVT   [] Phlebitis   [] Swelling in legs   [] Varicose veins   [] Non-healing ulcers ?Pulmonary:   [] Uses home oxygen   [] Productive cough   []   Hemoptysis   [] Wheeze  [x] COPD   [] Asthma ?Neurologic:  [] Dizziness  [] Blackouts   [] Seizures   [] History of stroke   [] History of TIA  [] Aphasia   [] Temporary blindness   [] Dysphagia   [] Weakness or numbness in arms   [] Weakness or numbness in legs ?Musculoskeletal:  [x] Arthritis   [] Joint swelling   [x] Joint pain   [] Low back pain ?Hematologic:  [] Easy bruising  [] Easy bleeding   [] Hypercoagulable state   [] Anemic  [] Hepatitis ?Gastrointestinal:  [] Blood in stool   [] Vomiting blood  [] Gastroesophageal reflux/heartburn   [] Difficulty swallowing. ?Genitourinary:  [] Chronic kidney disease   [] Difficult urination  [] Frequent urination  [] Burning with urination   [] Blood in urine ?Skin:  [] Rashes   [] Ulcers   [] Wounds ?Psychological:  [] History of anxiety   []  History of major depression. ?  ? ?Physical Examination ? ?BP (!) 157/67 (BP Location: Right Arm)   Pulse (!) 54   Resp 17   Ht 5\' 5"  (1.651 m)   Wt 155 lb (70.3 kg)   BMI 25.79 kg/m?  ?Gen:  WD/WN, NAD ?Head: [place]/AT, No temporalis wasting. ?Ear/Nose/Throat: Hearing grossly intact, nares w/o erythema or drainage ?Eyes: Conjunctiva clear. Sclera non-icteric ?Neck: Supple.  Trachea midline ?Pulmonary:  Good air movement, no use of accessory muscles.  ?Cardiac: RRR, no JVD ?Vascular:  ?Vessel Right Left  ?Radial Palpable Palpable  ?    ?    ?    ?    ?    ?    ?PT 1+ palpable 2+ palpable  ?DP 1+ palpable 1+ palpable  ? ? ?Musculoskeletal: M/S 5/5 throughout.  No deformity or  atrophy.  No significant lower extremity edema. ?Neurologic: Sensation grossly intact in extremities.  Symmetrical.  Speech is fluent.  ?Psychiatric: Judgment intact, Mood & affect appropriate for pt's clinical situation

## 2022-02-04 NOTE — Assessment & Plan Note (Signed)
His ABIs today are 0.89 on the right and 0.98 on the left with biphasic waveforms and digit pressures of 90 on the right and 118 on the left.  Markedly improved after multiple surgeries and interventions.  Symptoms are minimal at this point.  Continue current medical regimen.  Recheck in 1 year. ?

## 2022-02-04 NOTE — Assessment & Plan Note (Signed)
Carotid duplex today reveals a widely patent right carotid endarterectomy without significant recurrent stenosis and stable 40 to 59% left ICA stenosis without progression.  Continue current medications.  Recheck in 1 year. ?

## 2022-02-17 ENCOUNTER — Other Ambulatory Visit: Payer: Self-pay

## 2022-02-17 ENCOUNTER — Emergency Department: Payer: Medicare Other

## 2022-02-17 ENCOUNTER — Inpatient Hospital Stay: Payer: Medicare Other

## 2022-02-17 ENCOUNTER — Encounter: Payer: Self-pay | Admitting: Pulmonary Disease

## 2022-02-17 ENCOUNTER — Inpatient Hospital Stay
Admit: 2022-02-17 | Discharge: 2022-02-17 | Disposition: A | Discharge status: Home / Self Care | Payer: Medicare Other | Attending: Cardiology | Admitting: Cardiology

## 2022-02-17 ENCOUNTER — Inpatient Hospital Stay
Admission: EM | Admit: 2022-02-17 | Discharge: 2022-02-19 | DRG: 286 | Disposition: A | Discharge status: Other Acute Inpatient Hospital | Payer: Medicare Other | Attending: Internal Medicine | Admitting: Internal Medicine

## 2022-02-17 DIAGNOSIS — J9601 Acute respiratory failure with hypoxia: Secondary | ICD-10-CM | POA: Diagnosis not present

## 2022-02-17 DIAGNOSIS — I739 Peripheral vascular disease, unspecified: Secondary | ICD-10-CM | POA: Diagnosis present

## 2022-02-17 DIAGNOSIS — I5033 Acute on chronic diastolic (congestive) heart failure: Secondary | ICD-10-CM | POA: Diagnosis present

## 2022-02-17 DIAGNOSIS — Z888 Allergy status to other drugs, medicaments and biological substances status: Secondary | ICD-10-CM

## 2022-02-17 DIAGNOSIS — I252 Old myocardial infarction: Secondary | ICD-10-CM

## 2022-02-17 DIAGNOSIS — Z9981 Dependence on supplemental oxygen: Secondary | ICD-10-CM | POA: Diagnosis not present

## 2022-02-17 DIAGNOSIS — R911 Solitary pulmonary nodule: Secondary | ICD-10-CM | POA: Diagnosis present

## 2022-02-17 DIAGNOSIS — Z79899 Other long term (current) drug therapy: Secondary | ICD-10-CM

## 2022-02-17 DIAGNOSIS — I700 Atherosclerosis of aorta: Secondary | ICD-10-CM | POA: Diagnosis present

## 2022-02-17 DIAGNOSIS — R0603 Acute respiratory distress: Principal | ICD-10-CM

## 2022-02-17 DIAGNOSIS — I493 Ventricular premature depolarization: Secondary | ICD-10-CM | POA: Diagnosis present

## 2022-02-17 DIAGNOSIS — J9622 Acute and chronic respiratory failure with hypercapnia: Secondary | ICD-10-CM | POA: Diagnosis present

## 2022-02-17 DIAGNOSIS — I160 Hypertensive urgency: Secondary | ICD-10-CM | POA: Diagnosis present

## 2022-02-17 DIAGNOSIS — E785 Hyperlipidemia, unspecified: Secondary | ICD-10-CM | POA: Diagnosis present

## 2022-02-17 DIAGNOSIS — F32A Depression, unspecified: Secondary | ICD-10-CM | POA: Diagnosis present

## 2022-02-17 DIAGNOSIS — I959 Hypotension, unspecified: Secondary | ICD-10-CM | POA: Diagnosis not present

## 2022-02-17 DIAGNOSIS — Z7902 Long term (current) use of antithrombotics/antiplatelets: Secondary | ICD-10-CM

## 2022-02-17 DIAGNOSIS — J9602 Acute respiratory failure with hypercapnia: Secondary | ICD-10-CM

## 2022-02-17 DIAGNOSIS — F17210 Nicotine dependence, cigarettes, uncomplicated: Secondary | ICD-10-CM | POA: Diagnosis present

## 2022-02-17 DIAGNOSIS — G4733 Obstructive sleep apnea (adult) (pediatric): Secondary | ICD-10-CM | POA: Diagnosis present

## 2022-02-17 DIAGNOSIS — J441 Chronic obstructive pulmonary disease with (acute) exacerbation: Secondary | ICD-10-CM | POA: Diagnosis present

## 2022-02-17 DIAGNOSIS — I25810 Atherosclerosis of coronary artery bypass graft(s) without angina pectoris: Secondary | ICD-10-CM | POA: Diagnosis present

## 2022-02-17 DIAGNOSIS — I480 Paroxysmal atrial fibrillation: Secondary | ICD-10-CM | POA: Diagnosis present

## 2022-02-17 DIAGNOSIS — J9621 Acute and chronic respiratory failure with hypoxia: Secondary | ICD-10-CM | POA: Diagnosis present

## 2022-02-17 DIAGNOSIS — J810 Acute pulmonary edema: Secondary | ICD-10-CM | POA: Diagnosis present

## 2022-02-17 DIAGNOSIS — I2510 Atherosclerotic heart disease of native coronary artery without angina pectoris: Secondary | ICD-10-CM | POA: Diagnosis present

## 2022-02-17 DIAGNOSIS — Z79891 Long term (current) use of opiate analgesic: Secondary | ICD-10-CM

## 2022-02-17 DIAGNOSIS — Z9861 Coronary angioplasty status: Secondary | ICD-10-CM | POA: Diagnosis not present

## 2022-02-17 DIAGNOSIS — R7401 Elevation of levels of liver transaminase levels: Secondary | ICD-10-CM | POA: Diagnosis present

## 2022-02-17 DIAGNOSIS — D751 Secondary polycythemia: Secondary | ICD-10-CM | POA: Diagnosis present

## 2022-02-17 DIAGNOSIS — Z7951 Long term (current) use of inhaled steroids: Secondary | ICD-10-CM

## 2022-02-17 DIAGNOSIS — I110 Hypertensive heart disease with heart failure: Secondary | ICD-10-CM | POA: Diagnosis present

## 2022-02-17 DIAGNOSIS — J9620 Acute and chronic respiratory failure, unspecified whether with hypoxia or hypercapnia: Secondary | ICD-10-CM | POA: Diagnosis present

## 2022-02-17 DIAGNOSIS — Z818 Family history of other mental and behavioral disorders: Secondary | ICD-10-CM

## 2022-02-17 DIAGNOSIS — I350 Nonrheumatic aortic (valve) stenosis: Secondary | ICD-10-CM | POA: Diagnosis present

## 2022-02-17 DIAGNOSIS — Z716 Tobacco abuse counseling: Secondary | ICD-10-CM

## 2022-02-17 DIAGNOSIS — Z20822 Contact with and (suspected) exposure to covid-19: Secondary | ICD-10-CM | POA: Diagnosis present

## 2022-02-17 LAB — CBC WITH DIFFERENTIAL/PLATELET
Abs Immature Granulocytes: 0.04 10*3/uL (ref 0.00–0.07)
Basophils Absolute: 0.1 10*3/uL (ref 0.0–0.1)
Basophils Relative: 1 %
Eosinophils Absolute: 0.8 10*3/uL — ABNORMAL HIGH (ref 0.0–0.5)
Eosinophils Relative: 6 %
HCT: 57.9 % — ABNORMAL HIGH (ref 39.0–52.0)
Hemoglobin: 18.7 g/dL — ABNORMAL HIGH (ref 13.0–17.0)
Immature Granulocytes: 0 %
Lymphocytes Relative: 33 %
Lymphs Abs: 4.4 10*3/uL — ABNORMAL HIGH (ref 0.7–4.0)
MCH: 31.8 pg (ref 26.0–34.0)
MCHC: 32.3 g/dL (ref 30.0–36.0)
MCV: 98.5 fL (ref 80.0–100.0)
Monocytes Absolute: 1.6 10*3/uL — ABNORMAL HIGH (ref 0.1–1.0)
Monocytes Relative: 12 %
Neutro Abs: 6.7 10*3/uL (ref 1.7–7.7)
Neutrophils Relative %: 48 %
Platelets: 220 10*3/uL (ref 150–400)
RBC: 5.88 MIL/uL — ABNORMAL HIGH (ref 4.22–5.81)
RDW: 15.1 % (ref 11.5–15.5)
WBC: 13.6 10*3/uL — ABNORMAL HIGH (ref 4.0–10.5)
nRBC: 0 % (ref 0.0–0.2)

## 2022-02-17 LAB — ECHOCARDIOGRAM COMPLETE
AR max vel: 3.16 cm2
AV Area VTI: 3.56 cm2
AV Area mean vel: 3.52 cm2
AV Mean grad: 2.5 mmHg
AV Peak grad: 4.6 mmHg
Ao pk vel: 1.08 m/s
Area-P 1/2: 4.1 cm2
Calc EF: 56.7 %
Height: 66 in
MV VTI: 2.02 cm2
S' Lateral: 2.7 cm
Single Plane A2C EF: 60.1 %
Single Plane A4C EF: 54.1 %
Weight: 2511.48 oz

## 2022-02-17 LAB — COMPREHENSIVE METABOLIC PANEL
ALT: 30 U/L (ref 0–44)
AST: 58 U/L — ABNORMAL HIGH (ref 15–41)
Albumin: 4.6 g/dL (ref 3.5–5.0)
Alkaline Phosphatase: 87 U/L (ref 38–126)
Anion gap: 11 (ref 5–15)
BUN: 15 mg/dL (ref 8–23)
CO2: 32 mmol/L (ref 22–32)
Calcium: 9.1 mg/dL (ref 8.9–10.3)
Chloride: 100 mmol/L (ref 98–111)
Creatinine, Ser: 1.02 mg/dL (ref 0.61–1.24)
GFR, Estimated: >60 mL/min (ref >60)
Glucose, Bld: 179 mg/dL — ABNORMAL HIGH (ref 70–99)
Potassium: 4.4 mmol/L (ref 3.5–5.1)
Sodium: 143 mmol/L (ref 135–145)
Total Bilirubin: 0.8 mg/dL (ref 0.3–1.2)
Total Protein: 7.8 g/dL (ref 6.5–8.1)

## 2022-02-17 LAB — HEPARIN LEVEL (UNFRACTIONATED): Heparin Unfractionated: 0.62 IU/mL (ref 0.30–0.70)

## 2022-02-17 LAB — BLOOD GAS, ARTERIAL
Acid-Base Excess: 1.4 mmol/L (ref 0.0–2.0)
Acid-Base Excess: 4 mmol/L — ABNORMAL HIGH (ref 0.0–2.0)
Bicarbonate: 31.4 mmol/L — ABNORMAL HIGH (ref 20.0–28.0)
Bicarbonate: 30.8 mmol/L — ABNORMAL HIGH (ref 20.0–28.0)
FIO2: 100 %
FIO2: 40 %
MECHVT: 450 mL
MECHVT: 500 mL
O2 Saturation: 99.7 %
O2 Saturation: 98.9 %
PEEP: 5 cmH2O
PEEP: 5 cmH2O
Patient temperature: 37
Patient temperature: 37
RATE: 16 resp/min
RATE: 20 resp/min
pCO2 arterial: 70 mmHg (ref 32–48)
pCO2 arterial: 52 mmHg — ABNORMAL HIGH (ref 32–48)
pH, Arterial: 7.26 — ABNORMAL LOW (ref 7.35–7.45)
pH, Arterial: 7.38 (ref 7.35–7.45)
pO2, Arterial: 408 mmHg — ABNORMAL HIGH (ref 83–108)
pO2, Arterial: 91 mmHg (ref 83–108)

## 2022-02-17 LAB — MAGNESIUM: Magnesium: 2.8 mg/dL — ABNORMAL HIGH (ref 1.7–2.4)

## 2022-02-17 LAB — LIPID PANEL
Cholesterol: 121 mg/dL (ref 0–200)
HDL: 56 mg/dL (ref >40)
LDL Cholesterol: 42 mg/dL (ref 0–99)
Total CHOL/HDL Ratio: 2.2 RATIO
Triglycerides: 113 mg/dL (ref <150)
VLDL: 23 mg/dL (ref 0–40)

## 2022-02-17 LAB — RESPIRATORY PANEL BY PCR

## 2022-02-17 LAB — URINALYSIS, ROUTINE W REFLEX MICROSCOPIC
Bacteria, UA: NONE SEEN
Bilirubin Urine: NEGATIVE
Glucose, UA: 50 mg/dL — AB
Ketones, ur: NEGATIVE mg/dL
Leukocytes,Ua: NEGATIVE
Nitrite: NEGATIVE
Protein, ur: 100 mg/dL — AB
Specific Gravity, Urine: 1.009 (ref 1.005–1.030)
Squamous Epithelial / HPF: NONE SEEN (ref 0–5)
pH: 7 (ref 5.0–8.0)

## 2022-02-17 LAB — GLUCOSE, CAPILLARY
Glucose-Capillary: 223 mg/dL — ABNORMAL HIGH (ref 70–99)
Glucose-Capillary: 128 mg/dL — ABNORMAL HIGH (ref 70–99)
Glucose-Capillary: 99 mg/dL (ref 70–99)
Glucose-Capillary: 209 mg/dL — ABNORMAL HIGH (ref 70–99)
Glucose-Capillary: 208 mg/dL — ABNORMAL HIGH (ref 70–99)
Glucose-Capillary: 178 mg/dL — ABNORMAL HIGH (ref 70–99)

## 2022-02-17 LAB — TROPONIN I (HIGH SENSITIVITY)
Troponin I (High Sensitivity): 40 ng/L — ABNORMAL HIGH (ref <18)
Troponin I (High Sensitivity): 270 ng/L (ref <18)
Troponin I (High Sensitivity): 673 ng/L (ref <18)
Troponin I (High Sensitivity): 946 ng/L (ref <18)
Troponin I (High Sensitivity): 1034 ng/L (ref <18)
Troponin I (High Sensitivity): 796 ng/L (ref <18)

## 2022-02-17 LAB — URINE DRUG SCREEN, QUALITATIVE (ARMC ONLY)
Amphetamines, Ur Screen: NOT DETECTED
Barbiturates, Ur Screen: NOT DETECTED
Benzodiazepine, Ur Scrn: POSITIVE — AB
Cannabinoid 50 Ng, Ur ~~LOC~~: NOT DETECTED
Cocaine Metabolite,Ur ~~LOC~~: NOT DETECTED
MDMA (Ecstasy)Ur Screen: NOT DETECTED
Methadone Scn, Ur: NOT DETECTED
Opiate, Ur Screen: NOT DETECTED
Phencyclidine (PCP) Ur S: NOT DETECTED
Tricyclic, Ur Screen: NOT DETECTED

## 2022-02-17 LAB — BRAIN NATRIURETIC PEPTIDE: B Natriuretic Peptide: 305.5 pg/mL — ABNORMAL HIGH (ref 0.0–100.0)

## 2022-02-17 LAB — RESP PANEL BY RT-PCR (FLU A&B, COVID) ARPGX2
Influenza A by PCR: NEGATIVE
Influenza B by PCR: NEGATIVE
SARS Coronavirus 2 by RT PCR: NEGATIVE

## 2022-02-17 LAB — PHOSPHORUS: Phosphorus: 5.2 mg/dL — ABNORMAL HIGH (ref 2.5–4.6)

## 2022-02-17 LAB — STREP PNEUMONIAE URINARY ANTIGEN: Strep Pneumo Urinary Antigen: NEGATIVE

## 2022-02-17 LAB — MRSA NEXT GEN BY PCR, NASAL: MRSA by PCR Next Gen: NOT DETECTED

## 2022-02-17 LAB — HEMOGLOBIN A1C
Hgb A1c MFr Bld: 5.9 % — ABNORMAL HIGH (ref 4.8–5.6)
Mean Plasma Glucose: 122.63 mg/dL

## 2022-02-17 LAB — PROTIME-INR
INR: 1.4 — ABNORMAL HIGH (ref 0.8–1.2)
Prothrombin Time: 16.9 seconds — ABNORMAL HIGH (ref 11.4–15.2)

## 2022-02-17 LAB — PROCALCITONIN: Procalcitonin: 0.14 ng/mL

## 2022-02-17 LAB — APTT: aPTT: 35 seconds (ref 24–36)

## 2022-02-17 LAB — LACTIC ACID, PLASMA: Lactic Acid, Venous: 1.3 mmol/L (ref 0.5–1.9)

## 2022-02-17 MED ORDER — DOCUSATE SODIUM 100 MG PO CAPS: 100.0000 mg | ORAL_CAPSULE | Freq: Two times a day (BID) | ORAL | Status: DC | PRN

## 2022-02-17 MED ORDER — FENTANYL BOLUS VIA INFUSION: 50.0000 ug | INTRAVENOUS | Status: DC | PRN

## 2022-02-17 MED ORDER — ACETAMINOPHEN 325 MG PO TABS: 650.0000 mg | ORAL_TABLET | Freq: Four times a day (QID) | ORAL | Status: DC | PRN

## 2022-02-17 MED ORDER — POLYETHYLENE GLYCOL 3350 17 G PO PACK: 17.0000 g | PACK | Freq: Every day | ORAL | Status: DC | PRN

## 2022-02-17 MED ORDER — CLOPIDOGREL BISULFATE 75 MG PO TABS: 75.0000 mg | ORAL_TABLET | Freq: Every evening | ORAL | Status: DC

## 2022-02-17 MED ORDER — PANTOPRAZOLE SODIUM 40 MG IV SOLR
40.0000 mg | Freq: Every day | INTRAVENOUS | Status: DC
  Administered 2022-02-17 – 2022-02-18 (×2): 40 mg via INTRAVENOUS
  Filled 2022-02-17 (×2): qty 10

## 2022-02-17 MED ORDER — ENOXAPARIN SODIUM 40 MG/0.4ML IJ SOSY
40.0000 mg | PREFILLED_SYRINGE | INTRAMUSCULAR | Status: DC
  Administered 2022-02-17: 40 mg via SUBCUTANEOUS
  Filled 2022-02-17: qty 0.4

## 2022-02-17 MED ORDER — IPRATROPIUM-ALBUTEROL 0.5-2.5 (3) MG/3ML IN SOLN
3.0000 mL | RESPIRATORY_TRACT | Status: DC
  Administered 2022-02-17 – 2022-02-18 (×5): 3 mL via RESPIRATORY_TRACT
  Filled 2022-02-17 (×7): qty 3

## 2022-02-17 MED ORDER — FENTANYL CITRATE PF 50 MCG/ML IJ SOSY: 25.0000 ug | PREFILLED_SYRINGE | INTRAMUSCULAR | Status: DC | PRN

## 2022-02-17 MED ORDER — METHYLPREDNISOLONE SODIUM SUCC 40 MG IJ SOLR
40.0000 mg | Freq: Two times a day (BID) | INTRAMUSCULAR | Status: DC
  Administered 2022-02-17: 40 mg via INTRAVENOUS
  Filled 2022-02-17: qty 1

## 2022-02-17 MED ORDER — SUCCINYLCHOLINE CHLORIDE 200 MG/10ML IV SOSY
200.0000 mg | PREFILLED_SYRINGE | Freq: Once | INTRAVENOUS | Status: AC
  Administered 2022-02-17: 200 mg via INTRAVENOUS

## 2022-02-17 MED ORDER — ETOMIDATE 2 MG/ML IV SOLN
20.0000 mg | Freq: Once | INTRAVENOUS | Status: AC
  Administered 2022-02-17: 20 mg via INTRAVENOUS

## 2022-02-17 MED ORDER — FENTANYL 2500MCG IN NS 250ML (10MCG/ML) PREMIX INFUSION
0.0000 ug/h | INTRAVENOUS | Status: DC
  Administered 2022-02-17: 50 ug/h via INTRAVENOUS
  Filled 2022-02-17: qty 250

## 2022-02-17 MED ORDER — FUROSEMIDE 10 MG/ML IJ SOLN
40.0000 mg | Freq: Once | INTRAMUSCULAR | Status: AC
  Administered 2022-02-17: 40 mg via INTRAVENOUS
  Filled 2022-02-17: qty 4

## 2022-02-17 MED ORDER — ASPIRIN 300 MG RE SUPP: 300.0000 mg | Freq: Once | RECTAL | Status: DC

## 2022-02-17 MED ORDER — METHYLPREDNISOLONE SODIUM SUCC 40 MG IJ SOLR
20.0000 mg | Freq: Two times a day (BID) | INTRAMUSCULAR | Status: DC
  Administered 2022-02-17 – 2022-02-18 (×3): 20 mg via INTRAVENOUS
  Filled 2022-02-17 (×3): qty 1

## 2022-02-17 MED ORDER — IPRATROPIUM-ALBUTEROL 0.5-2.5 (3) MG/3ML IN SOLN
3.0000 mL | Freq: Four times a day (QID) | RESPIRATORY_TRACT | Status: DC
  Administered 2022-02-17: 3 mL via RESPIRATORY_TRACT
  Filled 2022-02-17: qty 3

## 2022-02-17 MED ORDER — CLOPIDOGREL BISULFATE 75 MG PO TABS
75.0000 mg | ORAL_TABLET | Freq: Every evening | ORAL | Status: DC
Start: 2022-02-17 — End: 2022-02-17

## 2022-02-17 MED ORDER — IPRATROPIUM-ALBUTEROL 0.5-2.5 (3) MG/3ML IN SOLN
RESPIRATORY_TRACT | Status: AC
  Filled 2022-02-17: qty 3

## 2022-02-17 MED ORDER — SODIUM CHLORIDE 0.9 % IV SOLN
1.0000 g | INTRAVENOUS | Status: DC
  Administered 2022-02-17: 1 g via INTRAVENOUS
  Filled 2022-02-17: qty 10

## 2022-02-17 MED ORDER — NOREPINEPHRINE 4 MG/250ML-% IV SOLN
INTRAVENOUS | Status: AC
  Filled 2022-02-17: qty 250

## 2022-02-17 MED ORDER — ASPIRIN 325 MG PO TABS: 325.0000 mg | ORAL_TABLET | Freq: Once | ORAL | Status: DC

## 2022-02-17 MED ORDER — ASPIRIN 81 MG PO CHEW
81.0000 mg | CHEWABLE_TABLET | Freq: Every day | ORAL | Status: DC
  Administered 2022-02-18: 81 mg via ORAL
  Filled 2022-02-17: qty 1

## 2022-02-17 MED ORDER — NITROGLYCERIN IN D5W 200-5 MCG/ML-% IV SOLN
0.0000 ug/min | INTRAVENOUS | Status: DC
  Administered 2022-02-17: 50 ug/min via INTRAVENOUS
  Administered 2022-02-17: 60 ug/min via INTRAVENOUS

## 2022-02-17 MED ORDER — FUROSEMIDE 10 MG/ML IJ SOLN
40.0000 mg | Freq: Once | INTRAMUSCULAR | Status: AC
  Administered 2022-02-17: 40 mg via INTRAVENOUS

## 2022-02-17 MED ORDER — NOREPINEPHRINE 4 MG/250ML-% IV SOLN
0.0000 ug/min | INTRAVENOUS | Status: DC
  Administered 2022-02-17: 10 ug/min via INTRAVENOUS

## 2022-02-17 MED ORDER — DOCUSATE SODIUM 50 MG/5ML PO LIQD: 100.0000 mg | Freq: Two times a day (BID) | ORAL | Status: DC | PRN

## 2022-02-17 MED ORDER — SODIUM CHLORIDE 0.9 % IV SOLN
500.0000 mg | INTRAVENOUS | Status: DC
  Administered 2022-02-17: 500 mg via INTRAVENOUS
  Filled 2022-02-17: qty 5

## 2022-02-17 MED ORDER — MIDAZOLAM HCL 2 MG/2ML IJ SOLN
2.0000 mg | Freq: Once | INTRAMUSCULAR | Status: AC
  Administered 2022-02-17: 2 mg via INTRAVENOUS
  Filled 2022-02-17: qty 2

## 2022-02-17 MED ORDER — MIDODRINE HCL 5 MG PO TABS
10.0000 mg | ORAL_TABLET | Freq: Three times a day (TID) | ORAL | Status: DC
  Administered 2022-02-17 – 2022-02-18 (×3): 10 mg via ORAL
  Filled 2022-02-17 (×3): qty 2

## 2022-02-17 MED ORDER — CLOPIDOGREL BISULFATE 75 MG PO TABS
75.0000 mg | ORAL_TABLET | Freq: Every evening | ORAL | Status: DC
  Administered 2022-02-17 – 2022-02-18 (×2): 75 mg via ORAL
  Filled 2022-02-17 (×2): qty 1

## 2022-02-17 MED ORDER — POLYETHYLENE GLYCOL 3350 17 G PO PACK: 17.0000 g | PACK | Freq: Every day | ORAL | Status: DC

## 2022-02-17 MED ORDER — INSULIN ASPART 100 UNIT/ML IJ SOLN
0.0000 [IU] | INTRAMUSCULAR | Status: DC
  Administered 2022-02-17: 2 [IU] via SUBCUTANEOUS
  Administered 2022-02-17: 5 [IU] via SUBCUTANEOUS
  Administered 2022-02-17: 3 [IU] via SUBCUTANEOUS
  Administered 2022-02-17: 5 [IU] via SUBCUTANEOUS
  Administered 2022-02-18: 2 [IU] via SUBCUTANEOUS
  Administered 2022-02-18: 3 [IU] via SUBCUTANEOUS
  Administered 2022-02-18: 2 [IU] via SUBCUTANEOUS
  Administered 2022-02-18 – 2022-02-19 (×2): 3 [IU] via SUBCUTANEOUS
  Filled 2022-02-17 (×9): qty 1

## 2022-02-17 MED ORDER — MIDAZOLAM HCL 2 MG/2ML IJ SOLN
2.0000 mg | Freq: Once | INTRAMUSCULAR | Status: DC
Start: 2022-02-17 — End: 2022-02-17

## 2022-02-17 MED ORDER — ASPIRIN 81 MG PO CHEW: 324.0000 mg | CHEWABLE_TABLET | Freq: Once | ORAL | Status: AC

## 2022-02-17 MED ORDER — MELATONIN 5 MG PO TABS
2.5000 mg | ORAL_TABLET | Freq: Every day | ORAL | Status: DC
  Administered 2022-02-17 – 2022-02-18 (×2): 2.5 mg via ORAL
  Filled 2022-02-17 (×2): qty 1

## 2022-02-17 MED ORDER — ASPIRIN 300 MG RE SUPP
300.0000 mg | Freq: Once | RECTAL | Status: AC
  Administered 2022-02-17: 300 mg via RECTAL
  Filled 2022-02-17: qty 1

## 2022-02-17 MED ORDER — IPRATROPIUM-ALBUTEROL 0.5-2.5 (3) MG/3ML IN SOLN: 3.0000 mL | RESPIRATORY_TRACT | Status: DC | PRN

## 2022-02-17 MED ORDER — HEPARIN BOLUS VIA INFUSION
2000.0000 [IU] | Freq: Once | INTRAVENOUS | Status: AC
  Administered 2022-02-17: 2000 [IU] via INTRAVENOUS
  Filled 2022-02-17: qty 2000

## 2022-02-17 MED ORDER — HEPARIN (PORCINE) 25000 UT/250ML-% IV SOLN
850.0000 [IU]/h | INTRAVENOUS | Status: DC
  Administered 2022-02-17: 850 [IU]/h via INTRAVENOUS
  Filled 2022-02-17: qty 250

## 2022-02-17 MED ORDER — BUDESONIDE 0.25 MG/2ML IN SUSP
0.2500 mg | Freq: Two times a day (BID) | RESPIRATORY_TRACT | Status: DC
  Administered 2022-02-17 – 2022-02-18 (×3): 0.25 mg via RESPIRATORY_TRACT
  Filled 2022-02-17 (×4): qty 2

## 2022-02-17 MED ORDER — DOCUSATE SODIUM 50 MG/5ML PO LIQD: 100.0000 mg | Freq: Two times a day (BID) | ORAL | Status: DC

## 2022-02-17 MED ORDER — PROPOFOL 1000 MG/100ML IV EMUL
5.0000 ug/kg/min | INTRAVENOUS | Status: DC
  Administered 2022-02-17: 35 ug/kg/min via INTRAVENOUS
  Administered 2022-02-17: 10 ug/kg/min via INTRAVENOUS
  Filled 2022-02-17 (×2): qty 100

## 2022-02-17 MED ORDER — FUROSEMIDE 10 MG/ML IJ SOLN
60.0000 mg | Freq: Once | INTRAMUSCULAR | Status: AC
  Administered 2022-02-17: 60 mg via INTRAVENOUS
  Filled 2022-02-17: qty 6

## 2022-02-17 MED ORDER — HEPARIN (PORCINE) 25000 UT/250ML-% IV SOLN
INTRAVENOUS | Status: AC
  Filled 2022-02-17: qty 250

## 2022-02-17 MED ORDER — PANTOPRAZOLE 2 MG/ML SUSPENSION: 40.0000 mg | Freq: Every day | ORAL | Status: DC

## 2022-02-17 NOTE — Progress Notes (Addendum)
Dr. Corky Sox notified of patients troponin level 946. Continue to assess.

## 2022-02-17 NOTE — ED Notes (Signed)
Warm blankets placed on pt. 

## 2022-02-17 NOTE — H&P (View-Only) (Signed)
[facility] NOTE       Patient ID: Dennis Preston MRN: [id number] DOB/AGE: [birth year] 71 y.o.  Admit date: 02/17/2022 Referring Physician Olga Millers, NP Primary Physician Dr. Tracie Harrier Primary Cardiologist 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 Cardiology is consulted for and elevated troponin, further assistance with his HFpEF.   The patient presents with his wife and oldest daughter who contribute to the history. Over the past week he has had more cough with clear sputum production and has chronic exertional dyspnea, only able to ambulate short distances in his house before becoming very short of breath. His wife states he has had poor appetite recently, yesterday evening he ate a pork chop for dinner and nothing else.  He went to bed yesterday evening around 11pm in his recliner but woke up his wife around 1 AM with severe shortness of breath, was clammy and sweaty with some chest tightness and presented to the ED.  While in the ED he was confused and in severe respiratory distress on CPAP.  He was given 80 mg of IV Lasix, placed on BiPAP and nitroglycerin drip with only slight improvement.  He remained in respiratory distress and was emergently intubated became hypotensive requiring a Levophed drip.  In my time of evaluation this morning patient just been extubated, he is alert  and able to tell me he feels much better than he did yesterday.  He denies any chest pain and does not feel short of breath. Wants to drink some water. In April he was seen in his regular cardiologist office and his lasix was increased to 42m twice daily for one day, then was instructed to continue once daily dosing thereafter. He reported improvement in his dyspnea and lost 6 pounds.   His BP was initially severely elevated at 225/134 and became hypotensive on the nitro drip. BP improved with levophed to 100/60 this morning.   Labs are notable for a potassium of 4.4, BUN/creatinine 15/1.02, EGFR greater than 60.  Magnesium 2.8.  BNP 305, high-sensitivity troponin trending [phone]  Procalcitonin minimally elevated 0.14.  Slight leukocytosis to 13.6, H&H 18.7/57.9 platelets 220.   Review of systems complete and found to be negative unless listed above     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, SVG-OM1, SVG-PDA, SVG-PL   Hyperlipemia    Hypertension    Leucocytosis    Liver dysfunction    Long term current use of clopidogrel    [place]  Peripheral artery disease (HCC)    legs   Tobacco abuse    Wears dentures    full upper and lower    Past Surgical History:  Procedure Laterality Date   APPLICATION OF WOUND VAC Right 06/22/2020   Procedure: APPLICATION OF WOUND VAC;  Surgeon: Algernon Huxley, MD;  Location: [facility] ORS;  Service: Vascular;  Laterality: Right;   CARDIAC CATHETERIZATION N/A 07/10/2015   Procedure: Right/Left Heart Cath and Coronary Angiography;  Surgeon: Yolonda Kida, MD;  Location: [facility];  Service: Cardiovascular;  Laterality: N/A;   CORONARY ARTERY BYPASS GRAFT  07/13/2015   [facility].  4 vessel   ENDARTERECTOMY Right 01/02/2021   Procedure: ENDARTERECTOMY CAROTID;  Surgeon: Algernon Huxley, MD;  Location: [facility] ORS;  Service: Vascular;  Laterality: Right;   ENDARTERECTOMY FEMORAL Bilateral 05/23/2020   Procedure: ENDARTERECTOMY FEMORAL BILATERAL SUPERFICIAL FEMORAL ARTERY STENTS ;  Surgeon: Katha Cabal, MD;  Location: [facility] ORS;  Service: Vascular;  Laterality: Bilateral;   FALSE ANEURYSM REPAIR Left 07/22/2020   Procedure: REPAIR LEFT FEMORAL PSEUDOANUERYSM;  Surgeon: Evaristo Bury, MD;  Location: [facility] ORS;  Service: Vascular;  Laterality: Left;   INSERTION OF ILIAC STENT Left 05/23/2020   Procedure: INSERTION OF ILIAC STENT;  Surgeon: Katha Cabal, MD;  Location: [facility] ORS;  Service: Vascular;  Laterality: Left;   LEFT HEART CATH AND CORS/GRAFTS ANGIOGRAPHY N/A 01/23/2020   Procedure: LEFT HEART CATH AND CORS/GRAFTS ANGIOGRAPHY;  Surgeon: Yolonda Kida, MD;  Location: [place];  Service: Cardiovascular;  Laterality: N/A;   LOWER EXTREMITY ANGIOGRAPHY Right 04/03/2020   Procedure: LOWER EXTREMITY ANGIOGRAPHY;  Surgeon: Katha Cabal, MD;  Location: [place] [facility];  Service: Cardiovascular;  Laterality: Right;   OLECRANON BURSECTOMY Left 05/06/2019   Procedure: OLECRANON BURSECTOMY AND DEBRIDEMENT;  Surgeon: Leim Fabry, MD;  Location: [facility] ORS;  Service: Orthopedics;  Laterality: Left;   WOUND DEBRIDEMENT Right 06/22/2020   Procedure: DEBRIDEMENT WOUND RIGHT GROIN;  Surgeon: Algernon Huxley, MD;  Location: [facility] ORS;  Service: Vascular;  Laterality: Right;    Medications Prior to Admission  Medication Sig Dispense Refill Last Dose   acetaminophen (TYLENOL) 325 MG tablet Take 650 mg by mouth every 6 (six) hours as needed for mild pain or headache.   prn at prn   albuterol (PROVENTIL) (2.5 MG/3ML) 0.083% nebulizer solution Take 3 mLs (2.5 mg total)  by nebulization every 6 (six) hours as needed for wheezing or shortness of breath. 360 mL 0 prn at prn   albuterol (VENTOLIN HFA) 108 (90 Base) MCG/ACT inhaler Inhale 1-2 puffs into the lungs every 6 (six) hours as needed for wheezing or shortness of breath. 8 g 0 prn at prn   atorvastatin (LIPITOR) 80 MG tablet Take 1 tablet (80 mg total) by mouth daily at 6 PM.   02/16/2022 at 1800   carvedilol (COREG) 3.125 MG tablet Take 3.125 mg by mouth 2 (two) times daily.   02/16/2022   clopidogrel (PLAVIX) 75 MG tablet Take 75 mg by mouth every evening.    02/16/2022   Fluticasone-Umeclidin-Vilant (TRELEGY ELLIPTA) 100-62.5-25 MCG/INH AEPB Inhale 1 puff into the lungs every other day.   Past Week   furosemide (LASIX) 40 MG tablet Take 40 mg by mouth daily.    02/16/2022   traMADol (ULTRAM) 50 MG tablet Take 1 tablet (50 mg total) by mouth every 6 (six) hours as needed. 30 tablet 0 prn at prn   zolpidem (AMBIEN)  5 MG tablet Take 5 mg by mouth at bedtime as needed.   prn at prn   Social History   Socioeconomic History   Marital status: Married    Spouse name: Not on file   Number of children: Not on file   Years of education: Not on file   Highest education level: Not on file  Occupational History   Not on file  Tobacco Use   Smoking status: Some Days    Packs/day: 2.50    Years: 54.00    Pack years: 135.00    Types: Cigarettes   Smokeless tobacco: Never   Tobacco comments:    0.5PPD 09/17/21  Vaping Use   Vaping Use: Never used  Substance and Sexual Activity   Alcohol use: Not Currently    Comment: quit over 15 yrs ago   Drug use: Never   Sexual activity: Not on file  Other Topics Concern   Not on file  Social History Narrative   Not on file   Social Determinants of Health   Financial Resource Strain: Not on file  Food Insecurity: Not on file  Transportation Needs: Not on file  Physical Activity: Not on file  Stress: Not on file  Social Connections: Not on file  Intimate Partner  Violence: Not on file    Family History  Problem Relation Age of Onset   Depression Sister       PHYSICAL EXAM General: Elderly and chronically ill-appearing Caucasian male, well nourished, in no acute distress.  Sitting upright in ICU bed with wife and daughter at bedside. HEENT:  Normocephalic and atraumatic. Neck:  No JVD.  Lungs: Slight conversational dyspnea on oxygen by nasal cannula.  Trace expiratory wheezes and crackles in left base.  Heart: HRRR with frequent PVCs. Normal S1 and S2 without gallops or murmurs. Radial & DP pulses 2+ bilaterally. Abdomen: Non-distended appearing.  Msk: Normal strength and tone for age. Extremities: Warm and well perfused. No clubbing, cyanosis. No peripheral edema, chronic skin changes to bilateral LE.  Neuro: Alert and oriented X 3, fatigued  Psych:  Answers questions appropriately.   Labs:   Lab Results  Component Value Date   WBC 13.6 (H) 02/17/2022   HGB 18.7 (H) 02/17/2022   HCT 57.9 (H) 02/17/2022   MCV 98.5 02/17/2022   PLT 220 02/17/2022    Recent Labs  Lab 02/17/22 0209  NA 143  K 4.4  CL 100  CO2 32  BUN 15  CREATININE 1.02  CALCIUM 9.1  PROT 7.8  BILITOT 0.8  ALKPHOS 87  ALT 30  AST 58*  GLUCOSE 179*   Lab Results  Component Value Date   CKMB 5.6 (H) 07/09/2015   TROPONINI <0.03 06/13/2018   No results found for: CHOL No results found for: HDL No results found for: LDLCALC No results found for: TRIG No results found for: CHOLHDL No results found for: LDLDIRECT    Radiology: DG Chest 1 View  Result Date: 02/17/2022 CLINICAL DATA:  Respiratory failure EXAM: CHEST  1 VIEW COMPARISON:  None Available. FINDINGS: Endotracheal tube roughly 7 cm above the carina. Nasogastric tube extends in the upper abdomen beyond the margin of the examination. The lungs are symmetrically hyperinflated in keeping with changes of underlying COPD. Pulmonary insufflation is stable since prior examination. Superimposed perihilar  interstitial pulmonary infiltrate persists likely reflecting mild perihilar pulmonary edema, possibly cardiogenic in nature. No confluent pulmonary infiltrate. No pneumothorax or pleural effusion. Left costophrenic angle is partially excluded from  view. Coronary artery bypass grafting has been performed. Cardiac size within normal limits. IMPRESSION: Support tubes in appropriate position. COPD. Persistent mild perihilar pulmonary edema, possibly cardiogenic in nature. Electronically Signed   By: Fidela Salisbury M.D.   On: 02/17/2022 03:35   DG Abdomen 1 View  Result Date: 02/17/2022 CLINICAL DATA:  For gastric tube placement EXAM: ABDOMEN - 1 VIEW COMPARISON:  None Available. FINDINGS: Orogastric tube tip is seen overlying the expected gastric fundus. Nonobstructive bowel gas pattern. No gross free intraperitoneal gas. IMPRESSION: Orogastric tube tip within the expected gastric fundus. Electronically Signed   By: Fidela Salisbury M.D.   On: 02/17/2022 03:40   CT Head Wo Contrast  Result Date: 02/17/2022 CLINICAL DATA:  Respiratory distress, diaphoretic, altered mental status EXAM: CT HEAD WITHOUT CONTRAST TECHNIQUE: Contiguous axial images were obtained from the base of the skull through the vertex without intravenous contrast. RADIATION DOSE REDUCTION: This exam was performed according to the departmental dose-optimization program which includes automated exposure control, adjustment of the mA and/or kV according to patient size and/or use of iterative reconstruction technique. COMPARISON:  04/19/2020 FINDINGS: Brain: No evidence of acute infarction, hemorrhage, cerebral edema, mass, mass effect, or midline shift. No hydrocephalus or extra-axial fluid collection. Periventricular white matter changes, likely the sequela of chronic small vessel ischemic disease. Vascular: No hyperdense vessel. Atherosclerotic calcifications in the intracranial carotid and vertebral arteries. Skull: Normal. Negative for fracture  or focal lesion. Sinuses/Orbits: Mucosal thickening in the frontal sinuses and ethmoid air cells. The orbits are unremarkable. Other: The mastoid air cells are well aerated. IMPRESSION: No acute intracranial process. No etiology seen for the patient's altered mental status. Electronically Signed   By: Merilyn Baba M.D.   On: 02/17/2022 03:53   DG Chest Port 1 View  Result Date: 02/17/2022 CLINICAL DATA:  Check central line positioning. EXAM: PORTABLE CHEST 1 VIEW COMPARISON:  Portable chest earlier today at 2:50 a.m. FINDINGS: 4:50 a.m., 02/17/2022. ETT has been advanced with tip now 6.6 cm from the carina. A left IJ central line has been placed and its tip is in the SVC at the azygous confluence. Enteric tube is better visualized on the current than prior study. The side-hole is at the hiatus and would recommend advancing the tube further into the stomach 8-10 cm. The cardiac size is normal with again noted transverse aortic atherosclerosis, CABG. Central vascular prominence appears similar as well as COPD and coarse chronic changes. There is no overt edema , no focal lung infiltrate is seen. No pleural effusion is evident. Osteopenia and thoracic spondylosis. IMPRESSION: 1. Left IJ line tip is in the SVC at the azygous confluence. There is no pneumothorax. 2. NGT should be advanced further into the stomach 8-10 cm. 3. COPD, chronic lung changes. No appreciable focal infiltrate. 4. Stable mild central vascular prominence. Electronically Signed   By: Telford Nab M.D.   On: 02/17/2022 05:27   DG Chest Port 1 View  Result Date: 02/17/2022 CLINICAL DATA:  Shortness of breath. EXAM: PORTABLE CHEST 1 VIEW COMPARISON:  Chest radiograph dated 02/24/2021 and CT dated 01/03/2022. FINDINGS: Background of emphysema and chronic interstitial coarsening. No focal consolidation, pleural effusion, pneumothorax. The cardiac silhouette is within normal limits. Mediastinal wires and CABG vascular clips. No acute osseous  pathology. IMPRESSION: No acute cardiopulmonary process. Electronically Signed   By: Anner Crete M.D.   On: 02/17/2022 02:54   VAS [place] ABI WITH/WO TBI  Result Date: 02/10/2022  LOWER EXTREMITY DOPPLER STUDY Patient Name:  Carley Hammed  Date of Exam:   02/04/2022 Medical Rec #: [id number]        Accession #:    [id number] Date of Birth: 28-Jul-1951       Patient Gender: M Patient Age:   49 years Exam Location:  [facility] Procedure:      VAS [place] ABI WITH/WO TBI Referring Phys: Leotis Pain --------------------------------------------------------------------------------  Indications: Peripheral artery disease.  Comparison Study: 06/18/2021 Performing Technologist: Concha Norway RVT  Examination Guidelines: A complete evaluation includes at minimum, Doppler waveform signals and systolic blood pressure reading at the level of bilateral brachial, anterior tibial, and posterior tibial arteries, when vessel segments are accessible. Bilateral testing is considered an integral part of a complete examination. Photoelectric Plethysmograph (PPG) waveforms and toe systolic pressure readings are included as required and additional duplex testing as needed. Limited examinations for reoccurring indications may be performed as noted.  ABI Findings: +---------+------------------+-----+----------+--------+ Right    Rt Pressure (mmHg)IndexWaveform  Comment  +---------+------------------+-----+----------+--------+ Brachial 122                                       +---------+------------------+-----+----------+--------+ ATA      110               0.89 biphasic           +---------+------------------+-----+----------+--------+ PTA      70                0.56 monophasic         +---------+------------------+-----+----------+--------+ Great Toe90                0.73 Abnormal           +---------+------------------+-----+----------+--------+ +---------+------------------+-----+--------+-------+  Left     Lt Pressure (mmHg)IndexWaveformComment +---------+------------------+-----+--------+-------+ Brachial 124                                    +---------+------------------+-----+--------+-------+ ATA      116               0.94 biphasic        +---------+------------------+-----+--------+-------+ PTA      122               0.98 biphasic        +---------+------------------+-----+--------+-------+ Great Toe118               0.95 Normal          +---------+------------------+-----+--------+-------+ +-------+-----------+-----------+------------+------------+ ABI/TBIToday's ABIToday's TBIPrevious ABIPrevious TBI +-------+-----------+-----------+------------+------------+ Right  .89        .73        .69         .52          +-------+-----------+-----------+------------+------------+ Left   .98        .95        1.18        .85          +-------+-----------+-----------+------------+------------+ Right ABIs appear increased compared to prior study on 05/2021.  Summary: Right: Resting right ankle-brachial index indicates mild right lower extremity arterial disease. The right toe-brachial index is normal. Left: Resting left ankle-brachial index is within normal range. No evidence of significant left lower extremity arterial disease. The left toe-brachial index is normal. *See table(s) above for measurements and observations.  Electronically signed by Leotis Pain MD  on 02/10/2022 at 2:00:08 PM.    Final    VAS US CAROTID  Result Date: 02/10/2022 Carotid Arterial Duplex Study Patient Name:  BRAEDYN KAUK  Date of Exam:   02/04/2022 Medical Rec #: [id number]        Accession #:    [id number] Date of Birth: 08-26-1951       Patient Gender: M Patient Age:   51 years Exam Location:  [facility] Procedure:      VAS US CAROTID Referring Phys: Leotis Pain --------------------------------------------------------------------------------  Indications: Carotid artery  disease and right endarterectomy. Performing Technologist: Concha Norway RVT  Examination Guidelines: A complete evaluation includes B-mode imaging, spectral Doppler, color Doppler, and power Doppler as needed of all accessible portions of each vessel. Bilateral testing is considered an integral part of a complete examination. Limited examinations for reoccurring indications may be performed as noted.  Right Carotid Findings: +----------+--------+--------+--------+------------------+--------+           PSV cm/sEDV cm/sStenosisPlaque DescriptionComments +----------+--------+--------+--------+------------------+--------+ CCA Prox  68      11                                         +----------+--------+--------+--------+------------------+--------+ CCA Mid   123     19                                         +----------+--------+--------+--------+------------------+--------+ CCA Distal69      10                                         +----------+--------+--------+--------+------------------+--------+ ICA Prox  34      8                                          +----------+--------+--------+--------+------------------+--------+ ICA Mid   33      10                                         +----------+--------+--------+--------+------------------+--------+ ICA Distal58      13                                         +----------+--------+--------+--------+------------------+--------+ ECA       164     25                                         +----------+--------+--------+--------+------------------+--------+ +----------+--------+-------+----------------+-------------------+           PSV cm/sEDV cmsDescribe        Arm Pressure (mmHG) +----------+--------+-------+----------------+-------------------+ Subclavian130     0      Multiphasic, WNL                    +----------+--------+-------+----------------+-------------------+  +---------+--------+--+--------+-+---------+ VertebralPSV cm/s26EDV cm/s7Antegrade +---------+--------+--+--------+-+---------+ Widely patent ICA s/p CEA.  Left Carotid Findings: +----------+--------+--------+--------+------------------+--------+           PSV cm/sEDV cm/sStenosisPlaque DescriptionComments +----------+--------+--------+--------+------------------+--------+ CCA Prox  44      14                                         +----------+--------+--------+--------+------------------+--------+ CCA Mid   93      16                                         +----------+--------+--------+--------+------------------+--------+ CCA Distal66      20                                         +----------+--------+--------+--------+------------------+--------+ ICA Prox  52      14                                         +----------+--------+--------+--------+------------------+--------+ ICA Mid   61      13                                         +----------+--------+--------+--------+------------------+--------+ ICA Distal178     41      40-59%  calcific                   +----------+--------+--------+--------+------------------+--------+ ECA       144     26                                         +----------+--------+--------+--------+------------------+--------+ +----------+--------+--------+----------------+-------------------+           PSV cm/sEDV cm/sDescribe        Arm Pressure (mmHG) +----------+--------+--------+----------------+-------------------+ [id number]      0       Multiphasic, WNL                    +----------+--------+--------+----------------+-------------------+ +---------+--------+--+--------+--+---------------+ VertebralPSV cm/s23EDV cm/s12Bi- directional +---------+--------+--+--------+--+---------------+   Summary: Right Carotid: There is no evidence of stenosis in the right ICA. Left Carotid: Velocities in the  left ICA are consistent with a 40-59% stenosis. Vertebrals:  Bilateral vertebral arteries demonstrate antegrade flow. Subclavians: Normal flow hemodynamics were seen in bilateral subclavian              arteries. *See table(s) above for measurements and observations.  Electronically signed by Leotis Pain MD on 02/10/2022 at 2:00:34 PM.    Final     01/23/2020 [facility] - Dr. Lujean Amel LM lesion is 90% stenosed. Ost 1st Diag lesion is 90% stenosed. Ramus lesion is 90% stenosed. Ost Cx lesion is 75% stenosed. Prox RCA lesion is 50% stenosed. Mid RCA lesion is 75% stenosed. Dist RCA-1 lesion is 75% stenosed. Dist RCA-2 lesion is 80% stenosed. RPAV-2 lesion is 80% stenosed. RPAV-1 lesion is 90% stenosed. Mid LM lesion is 90% stenosed. Ost LAD lesion is 90% stenosed. Origin lesion is 100% stenosed. Mid LAD lesion is 100%  stenosed.   Conclusion Successful diagnostic cardiac cath Mildly depressed left ventricular function with inferior hypokinesis EF around 45 to 50% Severe multivessel coronary disease including distal left main ostial LAD ostial circumflex Patent grafts LIMA to the LAD SVG to distal RCA SVG to ramus Occluded graft to distal circumflex Recommend medical therapy for now  ECHO 01/25/2022 ECHOCARDIOGRAPHIC MEASUREMENTS  2D DIMENSIONS  AORTA                  Values   Normal Range   MAIN PA         Values    Normal Range                Annulus: nm*          [2.3-2.9]         PA Main: nm*       [1.5-2.1]              Aorta Sin: nm*          [3.1-3.7]    RIGHT VENTRICLE            ST Junction: nm*          [2.6-3.2]         RV Base: nm*       [<4.2]              Asc.Aorta: nm*          [2.6-3.4]          RV Mid: nm*       [<3.5]  LEFT VENTRICLE                                      RV Length: nm*       [<8.6]                  LVIDd: 4.8 cm       [4.2-5.9]    INFERIOR VENA CAVA                  LVIDs: 3.6 cm                        Max. IVC: nm*       [<=2.1]                     FS:  23.5 %       [>25]            Min. IVC: nm*                    SWT: 1.1 cm       [0.6-1.0]    ------------------                    PWT: 0.96 cm      [0.6-1.0]    nm* - not measured  LEFT ATRIUM                LA Diam: 4.1 cm       [3.0-4.0]            LA A4C Area: nm*          [<20]              LA Volume: nm*          [18-58]  _________________________________________________________________________________________  ECHOCARDIOGRAPHIC DESCRIPTIONS  AORTIC ROOT                   Size: Normal             Dissection: INDETERM FOR DISSECTION  AORTIC VALVE               Leaflets: Tricuspid                   Morphology: Normal               Mobility: Fully mobile  LEFT VENTRICLE                   Size: Normal                        Anterior: Normal            Contraction: Normal                         Lateral: Normal             Closest EF: 50% (Estimated)                 Septal: Normal              LV Masses: No Masses                       Apical: Normal                    LVH: None                          Inferior: Normal                                                      Posterior: Normal           Dias.FxClass: (Grade 1) relaxation abnormal, E/A reversal  MITRAL VALVE               Leaflets: Normal                        Mobility: Fully mobile             Morphology: Normal  LEFT ATRIUM                   Size: MILDLY ENLARGED              LA Masses: No masses              IA Septum: Normal IAS  MAIN PA                   Size: Normal  PULMONIC VALVE             Morphology: Normal                        Mobility: Fully mobile  RIGHT VENTRICLE              RV Masses: No Masses  Size: Normal              Free Wall: Normal                     Contraction: Normal  TRICUSPID VALVE               Leaflets: Normal                        Mobility: Fully mobile             Morphology: Normal  RIGHT ATRIUM                   Size: Normal                        RA  Other: None                RA Mass: No masses  PERICARDIUM                  Fluid: No effusion  INFERIOR VENACAVA                   Size: Normal Normal respiratory collapse  _________________________________________________________________________________________   DOPPLER ECHO and OTHER SPECIAL PROCEDURES                 Aortic: No AR                      No AS                         124.3 cm/sec peak vel      6.2 mmHg peak grad                         2.9 mmHg mean grad         3.0 cm^2 by DOPPLER                 Mitral: TRIVIAL MR                 No MS                         MV Inflow E Vel = 60.4 cm/sec      MV Annulus E'Vel = nm*                         E/E'Ratio = nm*              Tricuspid: TRIVIAL TR                 No TS                         198.9 cm/sec peak TR vel              Pulmonary: TRIVIAL PR                 No PS  _________________________________________________________________________________________  INTERPRETATION  NORMAL LEFT VENTRICULAR SYSTOLIC FUNCTION WITH AN ESTIMATED EF = 50-55 %  NORMAL RIGHT VENTRICULAR SYSTOLIC FUNCTION  TRIVIAL REGURGITATION NOTED (See above)  NO VALVULAR STENOSIS  _________________________________________________________________________________________  Electronically signed by    Lujean Amel, MD on 01/25/2022 12: 54 PM  Performed By: Johnathan Hausen, RDCS, RVT     Ordering Physician: WHITE, DELICIA  _________________________________________________________________________________________  Rica Mote reviewed by me: sinus rhythm with frequent PVCs, rate 60s  EKG reviewed by me: initial sinus tach with STE V1-3 and inferolateral T changes, second repeat showing sinus tach rate 100, biatrial enlargement with inferolateral T wave changes, improving on third repeat   ASSESSMENT AND PLAN:  Dennis Preston "Dennis Pili" Preston is a 49yoM with a PMH of HFpEF (LVEF 50-55%, g1DD 01/25/22), CAD s/p CABG x4 06/2015 (occluded graft to distal  LCx, patent LIMA - LAD, SVG - distal RCA, SVG - ramus by cath 12/2019, paroxysmal atrial fibrillation not anticoagulated, PAD s/p right CEA, 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 Cardiology is consulted for and elevated troponin, further assistance with his HFpEF.   #acute on chronic respiratory failure  #elevated troponin #CAD s/p CABG x4 in 2016 #acute on chronic HFpEF (LVEF 50-55%, g1DD 12/2021)  The patient presented in respiratory distress with associated diaphoresis, clamminess, and some chest discomfort that woke him up from his sleep and thought he was having a heart attack. Over the past week he's had increased cough with clear sputum and worsening of his chronic dyspnea on exertion. He has had poor appetite but managed to eat a pork chop for dinner. He was intubated overnight with severe respiratory distress failing BIPAP and was dosed with 80 of iV lasix for pulmonary edema. Became hypotensive after nitro gtt, requiring Levophed. Troponin is uptrending  [phone] with repeats pending, possibly demand from volume overload in the setting of known CAD and his respiratory distress with inferolateral T wave changes on EKG that are improving with repeats. He has BNP elevated to 300s. Clinically improving this morning, extubated and on [place].  -agree with current therapy for possible COPD exacerbation -s/p 36m IV lasix, will dose another 659mIV this afternoon -s/p 30093mspirin rectally, will add 75m23mpirin daily in addition to his clopidogrel he takes for his PAD -continue atorvastatin 80mg41mart heparin gtt for 48 hours (ending 5/24) -wean NE as tolerated -hold home coreg 3.125 BID restart as NE is weaned off, hold PO lasix 40mg 30mnd troponins until peak   -repeat echocardiogram complete, further recommendations based off its results but likely conservative medical management from a cardiac standpoint  #paroxysmal AF -currently in sinus rhythm with PVCs,  -chadsvasc 4 - not on chronic AC per his outpatient cardiologist due to reported coagulopathy   #tobacco abuse Longstanding smoking history with period of cessation, recently started back with 1/2 PPD. Recommend complete cessation.   This patient's plan of care was discussed and created with Dr. Orgel Corky Soxe is in agreement.  Signed: Husam Hohn MTristan SchroederC 02/17/2022, 8:07 AM [facility]

## 2022-02-17 NOTE — Progress Notes (Signed)
Pt was suctioned prior to extubation for a small amount of tan secretions. Per Dr. Zoila Shutter order, he was extubated and placed on a 2 L nasal cannula.

## 2022-02-17 NOTE — Consult Note (Signed)
[facility] NOTE       Patient ID: [name] MRN: [id number] DOB/AGE: [birth year] 71 y.o.  Admit date: 02/17/2022 Referring Physician Olga Millers, NP Primary Physician Dr. Tracie Harrier Primary Cardiologist 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 Cardiology is consulted for and elevated troponin, further assistance with his HFpEF.   The patient presents with his wife and oldest daughter who contribute to the history. Over the past week he has had more cough with clear sputum production and has chronic exertional dyspnea, only able to ambulate short distances in his house before becoming very short of breath. His wife states he has had poor appetite recently, yesterday evening he ate a pork chop for dinner and nothing else.  He went to bed yesterday evening around 11pm in his recliner but woke up his wife around 1 AM with severe shortness of breath, was clammy and sweaty with some chest tightness and presented to the ED.  While in the ED he was confused and in severe respiratory distress on CPAP.  He was given 80 mg of IV Lasix, placed on BiPAP and nitroglycerin drip with only slight improvement.  He remained in respiratory distress and was emergently intubated became hypotensive requiring a Levophed drip.  In my time of evaluation this morning patient just been extubated, he is alert  and able to tell me he feels much better than he did yesterday.  He denies any chest pain and does not feel short of breath. Wants to drink some water. In April he was seen in his regular cardiologist office and his lasix was increased to 42m twice daily for one day, then was instructed to continue once daily dosing thereafter. He reported improvement in his dyspnea and lost 6 pounds.   His BP was initially severely elevated at 225/134 and became hypotensive on the nitro drip. BP improved with levophed to 100/60 this morning.   Labs are notable for a potassium of 4.4, BUN/creatinine 15/1.02, EGFR greater than 60.  Magnesium 2.8.  BNP 305, high-sensitivity troponin trending [phone]  Procalcitonin minimally elevated 0.14.  Slight leukocytosis to 13.6, H&H 18.7/57.9 platelets 220.   Review of systems complete and found to be negative unless listed above     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, SVG-OM1, SVG-PDA, SVG-PL   Hyperlipemia    Hypertension    Leucocytosis    Liver dysfunction    Long term current use of clopidogrel    [place]  Peripheral artery disease (HCC)    legs   Tobacco abuse    Wears dentures    full upper and lower    Past Surgical History:  Procedure Laterality Date   APPLICATION OF WOUND VAC Right 06/22/2020   Procedure: APPLICATION OF WOUND VAC;  Surgeon: Algernon Huxley, MD;  Location: [facility] ORS;  Service: Vascular;  Laterality: Right;   CARDIAC CATHETERIZATION N/A 07/10/2015   Procedure: Right/Left Heart Cath and Coronary Angiography;  Surgeon: Yolonda Kida, MD;  Location: [facility];  Service: Cardiovascular;  Laterality: N/A;   CORONARY ARTERY BYPASS GRAFT  07/13/2015   [facility].  4 vessel   ENDARTERECTOMY Right 01/02/2021   Procedure: ENDARTERECTOMY CAROTID;  Surgeon: Algernon Huxley, MD;  Location: [facility] ORS;  Service: Vascular;  Laterality: Right;   ENDARTERECTOMY FEMORAL Bilateral 05/23/2020   Procedure: ENDARTERECTOMY FEMORAL BILATERAL SUPERFICIAL FEMORAL ARTERY STENTS ;  Surgeon: Katha Cabal, MD;  Location: [facility] ORS;  Service: Vascular;  Laterality: Bilateral;   FALSE ANEURYSM REPAIR Left 07/22/2020   Procedure: REPAIR LEFT FEMORAL PSEUDOANUERYSM;  Surgeon: Evaristo Bury, MD;  Location: [facility] ORS;  Service: Vascular;  Laterality: Left;   INSERTION OF ILIAC STENT Left 05/23/2020   Procedure: INSERTION OF ILIAC STENT;  Surgeon: Katha Cabal, MD;  Location: [facility] ORS;  Service: Vascular;  Laterality: Left;   LEFT HEART CATH AND CORS/GRAFTS ANGIOGRAPHY N/A 01/23/2020   Procedure: LEFT HEART CATH AND CORS/GRAFTS ANGIOGRAPHY;  Surgeon: Yolonda Kida, MD;  Location: [place];  Service: Cardiovascular;  Laterality: N/A;   LOWER EXTREMITY ANGIOGRAPHY Right 04/03/2020   Procedure: LOWER EXTREMITY ANGIOGRAPHY;  Surgeon: Katha Cabal, MD;  Location: [place] [facility];  Service: Cardiovascular;  Laterality: Right;   OLECRANON BURSECTOMY Left 05/06/2019   Procedure: OLECRANON BURSECTOMY AND DEBRIDEMENT;  Surgeon: Leim Fabry, MD;  Location: [facility] ORS;  Service: Orthopedics;  Laterality: Left;   WOUND DEBRIDEMENT Right 06/22/2020   Procedure: DEBRIDEMENT WOUND RIGHT GROIN;  Surgeon: Algernon Huxley, MD;  Location: [facility] ORS;  Service: Vascular;  Laterality: Right;    Medications Prior to Admission  Medication Sig Dispense Refill Last Dose   acetaminophen (TYLENOL) 325 MG tablet Take 650 mg by mouth every 6 (six) hours as needed for mild pain or headache.   prn at prn   albuterol (PROVENTIL) (2.5 MG/3ML) 0.083% nebulizer solution Take 3 mLs (2.5 mg total)  by nebulization every 6 (six) hours as needed for wheezing or shortness of breath. 360 mL 0 prn at prn   albuterol (VENTOLIN HFA) 108 (90 Base) MCG/ACT inhaler Inhale 1-2 puffs into the lungs every 6 (six) hours as needed for wheezing or shortness of breath. 8 g 0 prn at prn   atorvastatin (LIPITOR) 80 MG tablet Take 1 tablet (80 mg total) by mouth daily at 6 PM.   02/16/2022 at 1800   carvedilol (COREG) 3.125 MG tablet Take 3.125 mg by mouth 2 (two) times daily.   02/16/2022   clopidogrel (PLAVIX) 75 MG tablet Take 75 mg by mouth every evening.    02/16/2022   Fluticasone-Umeclidin-Vilant (TRELEGY ELLIPTA) 100-62.5-25 MCG/INH AEPB Inhale 1 puff into the lungs every other day.   Past Week   furosemide (LASIX) 40 MG tablet Take 40 mg by mouth daily.    02/16/2022   traMADol (ULTRAM) 50 MG tablet Take 1 tablet (50 mg total) by mouth every 6 (six) hours as needed. 30 tablet 0 prn at prn   zolpidem (AMBIEN)  5 MG tablet Take 5 mg by mouth at bedtime as needed.   prn at prn   Social History   Socioeconomic History   Marital status: Married    Spouse name: Not on file   Number of children: Not on file   Years of education: Not on file   Highest education level: Not on file  Occupational History   Not on file  Tobacco Use   Smoking status: Some Days    Packs/day: 2.50    Years: 54.00    Pack years: 135.00    Types: Cigarettes   Smokeless tobacco: Never   Tobacco comments:    0.5PPD 09/17/21  Vaping Use   Vaping Use: Never used  Substance and Sexual Activity   Alcohol use: Not Currently    Comment: quit over 15 yrs ago   Drug use: Never   Sexual activity: Not on file  Other Topics Concern   Not on file  Social History Narrative   Not on file   Social Determinants of Health   Financial Resource Strain: Not on file  Food Insecurity: Not on file  Transportation Needs: Not on file  Physical Activity: Not on file  Stress: Not on file  Social Connections: Not on file  Intimate Partner  Violence: Not on file    Family History  Problem Relation Age of Onset   Depression Sister       PHYSICAL EXAM General: Elderly and chronically ill-appearing Caucasian male, well nourished, in no acute distress.  Sitting upright in ICU bed with wife and daughter at bedside. HEENT:  Normocephalic and atraumatic. Neck:  No JVD.  Lungs: Slight conversational dyspnea on oxygen by nasal cannula.  Trace expiratory wheezes and crackles in left base.  Heart: HRRR with frequent PVCs. Normal S1 and S2 without gallops or murmurs. Radial & DP pulses 2+ bilaterally. Abdomen: Non-distended appearing.  Msk: Normal strength and tone for age. Extremities: Warm and well perfused. No clubbing, cyanosis. No peripheral edema, chronic skin changes to bilateral LE.  Neuro: Alert and oriented X 3, fatigued  Psych:  Answers questions appropriately.   Labs:   Lab Results  Component Value Date   WBC 13.6 (H) 02/17/2022   HGB 18.7 (H) 02/17/2022   HCT 57.9 (H) 02/17/2022   MCV 98.5 02/17/2022   PLT 220 02/17/2022    Recent Labs  Lab 02/17/22 0209  NA 143  K 4.4  CL 100  CO2 32  BUN 15  CREATININE 1.02  CALCIUM 9.1  PROT 7.8  BILITOT 0.8  ALKPHOS 87  ALT 30  AST 58*  GLUCOSE 179*   Lab Results  Component Value Date   CKMB 5.6 (H) 07/09/2015   TROPONINI <0.03 06/13/2018   No results found for: CHOL No results found for: HDL No results found for: LDLCALC No results found for: TRIG No results found for: CHOLHDL No results found for: LDLDIRECT    Radiology: DG Chest 1 View  Result Date: 02/17/2022 CLINICAL DATA:  Respiratory failure EXAM: CHEST  1 VIEW COMPARISON:  None Available. FINDINGS: Endotracheal tube roughly 7 cm above the carina. Nasogastric tube extends in the upper abdomen beyond the margin of the examination. The lungs are symmetrically hyperinflated in keeping with changes of underlying COPD. Pulmonary insufflation is stable since prior examination. Superimposed perihilar  interstitial pulmonary infiltrate persists likely reflecting mild perihilar pulmonary edema, possibly cardiogenic in nature. No confluent pulmonary infiltrate. No pneumothorax or pleural effusion. Left costophrenic angle is partially excluded from  view. Coronary artery bypass grafting has been performed. Cardiac size within normal limits. IMPRESSION: Support tubes in appropriate position. COPD. Persistent mild perihilar pulmonary edema, possibly cardiogenic in nature. Electronically Signed   By: Fidela Salisbury M.D.   On: 02/17/2022 03:35   DG Abdomen 1 View  Result Date: 02/17/2022 CLINICAL DATA:  For gastric tube placement EXAM: ABDOMEN - 1 VIEW COMPARISON:  None Available. FINDINGS: Orogastric tube tip is seen overlying the expected gastric fundus. Nonobstructive bowel gas pattern. No gross free intraperitoneal gas. IMPRESSION: Orogastric tube tip within the expected gastric fundus. Electronically Signed   By: Fidela Salisbury M.D.   On: 02/17/2022 03:40   CT Head Wo Contrast  Result Date: 02/17/2022 CLINICAL DATA:  Respiratory distress, diaphoretic, altered mental status EXAM: CT HEAD WITHOUT CONTRAST TECHNIQUE: Contiguous axial images were obtained from the base of the skull through the vertex without intravenous contrast. RADIATION DOSE REDUCTION: This exam was performed according to the departmental dose-optimization program which includes automated exposure control, adjustment of the mA and/or kV according to patient size and/or use of iterative reconstruction technique. COMPARISON:  04/19/2020 FINDINGS: Brain: No evidence of acute infarction, hemorrhage, cerebral edema, mass, mass effect, or midline shift. No hydrocephalus or extra-axial fluid collection. Periventricular white matter changes, likely the sequela of chronic small vessel ischemic disease. Vascular: No hyperdense vessel. Atherosclerotic calcifications in the intracranial carotid and vertebral arteries. Skull: Normal. Negative for fracture  or focal lesion. Sinuses/Orbits: Mucosal thickening in the frontal sinuses and ethmoid air cells. The orbits are unremarkable. Other: The mastoid air cells are well aerated. IMPRESSION: No acute intracranial process. No etiology seen for the patient's altered mental status. Electronically Signed   By: Merilyn Baba M.D.   On: 02/17/2022 03:53   DG Chest Port 1 View  Result Date: 02/17/2022 CLINICAL DATA:  Check central line positioning. EXAM: PORTABLE CHEST 1 VIEW COMPARISON:  Portable chest earlier today at 2:50 a.m. FINDINGS: 4:50 a.m., 02/17/2022. ETT has been advanced with tip now 6.6 cm from the carina. A left IJ central line has been placed and its tip is in the SVC at the azygous confluence. Enteric tube is better visualized on the current than prior study. The side-hole is at the hiatus and would recommend advancing the tube further into the stomach 8-10 cm. The cardiac size is normal with again noted transverse aortic atherosclerosis, CABG. Central vascular prominence appears similar as well as COPD and coarse chronic changes. There is no overt edema , no focal lung infiltrate is seen. No pleural effusion is evident. Osteopenia and thoracic spondylosis. IMPRESSION: 1. Left IJ line tip is in the SVC at the azygous confluence. There is no pneumothorax. 2. NGT should be advanced further into the stomach 8-10 cm. 3. COPD, chronic lung changes. No appreciable focal infiltrate. 4. Stable mild central vascular prominence. Electronically Signed   By: Telford Nab M.D.   On: 02/17/2022 05:27   DG Chest Port 1 View  Result Date: 02/17/2022 CLINICAL DATA:  Shortness of breath. EXAM: PORTABLE CHEST 1 VIEW COMPARISON:  Chest radiograph dated 02/24/2021 and CT dated 01/03/2022. FINDINGS: Background of emphysema and chronic interstitial coarsening. No focal consolidation, pleural effusion, pneumothorax. The cardiac silhouette is within normal limits. Mediastinal wires and CABG vascular clips. No acute osseous  pathology. IMPRESSION: No acute cardiopulmonary process. Electronically Signed   By: Anner Crete M.D.   On: 02/17/2022 02:54   VAS [place] ABI WITH/WO TBI  Result Date: 02/10/2022  LOWER EXTREMITY DOPPLER STUDY Patient Name:  Carley Hammed  Date of Exam:   02/04/2022 Medical Rec #: [id number]        Accession #:    [id number] Date of Birth: 28-Jul-1951       Patient Gender: M Patient Age:   49 years Exam Location:  [facility] Procedure:      VAS [place] ABI WITH/WO TBI Referring Phys: Leotis Pain --------------------------------------------------------------------------------  Indications: Peripheral artery disease.  Comparison Study: 06/18/2021 Performing Technologist: Concha Norway RVT  Examination Guidelines: A complete evaluation includes at minimum, Doppler waveform signals and systolic blood pressure reading at the level of bilateral brachial, anterior tibial, and posterior tibial arteries, when vessel segments are accessible. Bilateral testing is considered an integral part of a complete examination. Photoelectric Plethysmograph (PPG) waveforms and toe systolic pressure readings are included as required and additional duplex testing as needed. Limited examinations for reoccurring indications may be performed as noted.  ABI Findings: +---------+------------------+-----+----------+--------+ Right    Rt Pressure (mmHg)IndexWaveform  Comment  +---------+------------------+-----+----------+--------+ Brachial 122                                       +---------+------------------+-----+----------+--------+ ATA      110               0.89 biphasic           +---------+------------------+-----+----------+--------+ PTA      70                0.56 monophasic         +---------+------------------+-----+----------+--------+ Great Toe90                0.73 Abnormal           +---------+------------------+-----+----------+--------+ +---------+------------------+-----+--------+-------+  Left     Lt Pressure (mmHg)IndexWaveformComment +---------+------------------+-----+--------+-------+ Brachial 124                                    +---------+------------------+-----+--------+-------+ ATA      116               0.94 biphasic        +---------+------------------+-----+--------+-------+ PTA      122               0.98 biphasic        +---------+------------------+-----+--------+-------+ Great Toe118               0.95 Normal          +---------+------------------+-----+--------+-------+ +-------+-----------+-----------+------------+------------+ ABI/TBIToday's ABIToday's TBIPrevious ABIPrevious TBI +-------+-----------+-----------+------------+------------+ Right  .89        .73        .69         .52          +-------+-----------+-----------+------------+------------+ Left   .98        .95        1.18        .85          +-------+-----------+-----------+------------+------------+ Right ABIs appear increased compared to prior study on 05/2021.  Summary: Right: Resting right ankle-brachial index indicates mild right lower extremity arterial disease. The right toe-brachial index is normal. Left: Resting left ankle-brachial index is within normal range. No evidence of significant left lower extremity arterial disease. The left toe-brachial index is normal. *See table(s) above for measurements and observations.  Electronically signed by Leotis Pain MD  on 02/10/2022 at 2:00:08 PM.    Final    VAS US CAROTID  Result Date: 02/10/2022 Carotid Arterial Duplex Study Patient Name:  BRAEDYN KAUK  Date of Exam:   02/04/2022 Medical Rec #: [id number]        Accession #:    [id number] Date of Birth: 08-26-1951       Patient Gender: M Patient Age:   51 years Exam Location:  [facility] Procedure:      VAS US CAROTID Referring Phys: Leotis Pain --------------------------------------------------------------------------------  Indications: Carotid artery  disease and right endarterectomy. Performing Technologist: Concha Norway RVT  Examination Guidelines: A complete evaluation includes B-mode imaging, spectral Doppler, color Doppler, and power Doppler as needed of all accessible portions of each vessel. Bilateral testing is considered an integral part of a complete examination. Limited examinations for reoccurring indications may be performed as noted.  Right Carotid Findings: +----------+--------+--------+--------+------------------+--------+           PSV cm/sEDV cm/sStenosisPlaque DescriptionComments +----------+--------+--------+--------+------------------+--------+ CCA Prox  68      11                                         +----------+--------+--------+--------+------------------+--------+ CCA Mid   123     19                                         +----------+--------+--------+--------+------------------+--------+ CCA Distal69      10                                         +----------+--------+--------+--------+------------------+--------+ ICA Prox  34      8                                          +----------+--------+--------+--------+------------------+--------+ ICA Mid   33      10                                         +----------+--------+--------+--------+------------------+--------+ ICA Distal58      13                                         +----------+--------+--------+--------+------------------+--------+ ECA       164     25                                         +----------+--------+--------+--------+------------------+--------+ +----------+--------+-------+----------------+-------------------+           PSV cm/sEDV cmsDescribe        Arm Pressure (mmHG) +----------+--------+-------+----------------+-------------------+ Subclavian130     0      Multiphasic, WNL                    +----------+--------+-------+----------------+-------------------+  +---------+--------+--+--------+-+---------+ VertebralPSV cm/s26EDV cm/s7Antegrade +---------+--------+--+--------+-+---------+ Widely patent ICA s/p CEA.  Left Carotid Findings: +----------+--------+--------+--------+------------------+--------+           PSV cm/sEDV cm/sStenosisPlaque DescriptionComments +----------+--------+--------+--------+------------------+--------+ CCA Prox  44      14                                         +----------+--------+--------+--------+------------------+--------+ CCA Mid   93      16                                         +----------+--------+--------+--------+------------------+--------+ CCA Distal66      20                                         +----------+--------+--------+--------+------------------+--------+ ICA Prox  52      14                                         +----------+--------+--------+--------+------------------+--------+ ICA Mid   61      13                                         +----------+--------+--------+--------+------------------+--------+ ICA Distal178     41      40-59%  calcific                   +----------+--------+--------+--------+------------------+--------+ ECA       144     26                                         +----------+--------+--------+--------+------------------+--------+ +----------+--------+--------+----------------+-------------------+           PSV cm/sEDV cm/sDescribe        Arm Pressure (mmHG) +----------+--------+--------+----------------+-------------------+ [id number]      0       Multiphasic, WNL                    +----------+--------+--------+----------------+-------------------+ +---------+--------+--+--------+--+---------------+ VertebralPSV cm/s23EDV cm/s12Bi- directional +---------+--------+--+--------+--+---------------+   Summary: Right Carotid: There is no evidence of stenosis in the right ICA. Left Carotid: Velocities in the  left ICA are consistent with a 40-59% stenosis. Vertebrals:  Bilateral vertebral arteries demonstrate antegrade flow. Subclavians: Normal flow hemodynamics were seen in bilateral subclavian              arteries. *See table(s) above for measurements and observations.  Electronically signed by Leotis Pain MD on 02/10/2022 at 2:00:34 PM.    Final     01/23/2020 [facility] - Dr. Lujean Amel LM lesion is 90% stenosed. Ost 1st Diag lesion is 90% stenosed. Ramus lesion is 90% stenosed. Ost Cx lesion is 75% stenosed. Prox RCA lesion is 50% stenosed. Mid RCA lesion is 75% stenosed. Dist RCA-1 lesion is 75% stenosed. Dist RCA-2 lesion is 80% stenosed. RPAV-2 lesion is 80% stenosed. RPAV-1 lesion is 90% stenosed. Mid LM lesion is 90% stenosed. Ost LAD lesion is 90% stenosed. Origin lesion is 100% stenosed. Mid LAD lesion is 100%  stenosed.   Conclusion Successful diagnostic cardiac cath Mildly depressed left ventricular function with inferior hypokinesis EF around 45 to 50% Severe multivessel coronary disease including distal left main ostial LAD ostial circumflex Patent grafts LIMA to the LAD SVG to distal RCA SVG to ramus Occluded graft to distal circumflex Recommend medical therapy for now  ECHO 01/25/2022 ECHOCARDIOGRAPHIC MEASUREMENTS  2D DIMENSIONS  AORTA                  Values   Normal Range   MAIN PA         Values    Normal Range                Annulus: nm*          [2.3-2.9]         PA Main: nm*       [1.5-2.1]              Aorta Sin: nm*          [3.1-3.7]    RIGHT VENTRICLE            ST Junction: nm*          [2.6-3.2]         RV Base: nm*       [<4.2]              Asc.Aorta: nm*          [2.6-3.4]          RV Mid: nm*       [<3.5]  LEFT VENTRICLE                                      RV Length: nm*       [<8.6]                  LVIDd: 4.8 cm       [4.2-5.9]    INFERIOR VENA CAVA                  LVIDs: 3.6 cm                        Max. IVC: nm*       [<=2.1]                     FS:  23.5 %       [>25]            Min. IVC: nm*                    SWT: 1.1 cm       [0.6-1.0]    ------------------                    PWT: 0.96 cm      [0.6-1.0]    nm* - not measured  LEFT ATRIUM                LA Diam: 4.1 cm       [3.0-4.0]            LA A4C Area: nm*          [<20]              LA Volume: nm*          [18-58]  _________________________________________________________________________________________  ECHOCARDIOGRAPHIC DESCRIPTIONS  AORTIC ROOT                   Size: Normal             Dissection: INDETERM FOR DISSECTION  AORTIC VALVE               Leaflets: Tricuspid                   Morphology: Normal               Mobility: Fully mobile  LEFT VENTRICLE                   Size: Normal                        Anterior: Normal            Contraction: Normal                         Lateral: Normal             Closest EF: 50% (Estimated)                 Septal: Normal              LV Masses: No Masses                       Apical: Normal                    LVH: None                          Inferior: Normal                                                      Posterior: Normal           Dias.FxClass: (Grade 1) relaxation abnormal, E/A reversal  MITRAL VALVE               Leaflets: Normal                        Mobility: Fully mobile             Morphology: Normal  LEFT ATRIUM                   Size: MILDLY ENLARGED              LA Masses: No masses              IA Septum: Normal IAS  MAIN PA                   Size: Normal  PULMONIC VALVE             Morphology: Normal                        Mobility: Fully mobile  RIGHT VENTRICLE              RV Masses: No Masses  Size: Normal              Free Wall: Normal                     Contraction: Normal  TRICUSPID VALVE               Leaflets: Normal                        Mobility: Fully mobile             Morphology: Normal  RIGHT ATRIUM                   Size: Normal                        RA  Other: None                RA Mass: No masses  PERICARDIUM                  Fluid: No effusion  INFERIOR VENACAVA                   Size: Normal Normal respiratory collapse  _________________________________________________________________________________________   DOPPLER ECHO and OTHER SPECIAL PROCEDURES                 Aortic: No AR                      No AS                         124.3 cm/sec peak vel      6.2 mmHg peak grad                         2.9 mmHg mean grad         3.0 cm^2 by DOPPLER                 Mitral: TRIVIAL MR                 No MS                         MV Inflow E Vel = 60.4 cm/sec      MV Annulus E'Vel = nm*                         E/E'Ratio = nm*              Tricuspid: TRIVIAL TR                 No TS                         198.9 cm/sec peak TR vel              Pulmonary: TRIVIAL PR                 No PS  _________________________________________________________________________________________  INTERPRETATION  NORMAL LEFT VENTRICULAR SYSTOLIC FUNCTION WITH AN ESTIMATED EF = 50-55 %  NORMAL RIGHT VENTRICULAR SYSTOLIC FUNCTION  TRIVIAL REGURGITATION NOTED (See above)  NO VALVULAR STENOSIS  _________________________________________________________________________________________  Electronically signed by    Lujean Amel, MD on 01/25/2022 12: 54 PM  Performed By: Johnathan Hausen, RDCS, RVT     Ordering Physician: WHITE, DELICIA  _________________________________________________________________________________________  Rica Mote reviewed by me: sinus rhythm with frequent PVCs, rate 60s  EKG reviewed by me: initial sinus tach with STE V1-3 and inferolateral T changes, second repeat showing sinus tach rate 100, biatrial enlargement with inferolateral T wave changes, improving on third repeat   ASSESSMENT AND PLAN:  Elenore Rota "Audry Pili" Poitra is a 49yoM with a PMH of HFpEF (LVEF 50-55%, g1DD 01/25/22), CAD s/p CABG x4 06/2015 (occluded graft to distal  LCx, patent LIMA - LAD, SVG - distal RCA, SVG - ramus by cath 12/2019, paroxysmal atrial fibrillation not anticoagulated, PAD s/p right CEA, 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 Cardiology is consulted for and elevated troponin, further assistance with his HFpEF.   #acute on chronic respiratory failure  #elevated troponin #CAD s/p CABG x4 in 2016 #acute on chronic HFpEF (LVEF 50-55%, g1DD 12/2021)  The patient presented in respiratory distress with associated diaphoresis, clamminess, and some chest discomfort that woke him up from his sleep and thought he was having a heart attack. Over the past week he's had increased cough with clear sputum and worsening of his chronic dyspnea on exertion. He has had poor appetite but managed to eat a pork chop for dinner. He was intubated overnight with severe respiratory distress failing BIPAP and was dosed with 80 of iV lasix for pulmonary edema. Became hypotensive after nitro gtt, requiring Levophed. Troponin is uptrending  [phone] with repeats pending, possibly demand from volume overload in the setting of known CAD and his respiratory distress with inferolateral T wave changes on EKG that are improving with repeats. He has BNP elevated to 300s. Clinically improving this morning, extubated and on [place].  -agree with current therapy for possible COPD exacerbation -s/p 36m IV lasix, will dose another 659mIV this afternoon -s/p 30093mspirin rectally, will add 75m23mpirin daily in addition to his clopidogrel he takes for his PAD -continue atorvastatin 80mg41mart heparin gtt for 48 hours (ending 5/24) -wean NE as tolerated -hold home coreg 3.125 BID restart as NE is weaned off, hold PO lasix 40mg 30mnd troponins until peak   -repeat echocardiogram complete, further recommendations based off its results but likely conservative medical management from a cardiac standpoint  #paroxysmal AF -currently in sinus rhythm with PVCs,  -chadsvasc 4 - not on chronic AC per his outpatient cardiologist due to reported coagulopathy   #tobacco abuse Longstanding smoking history with period of cessation, recently started back with 1/2 PPD. Recommend complete cessation.   This patient's plan of care was discussed and created with Dr. Orgel Corky Soxe is in agreement.  Signed: Leimomi Zervas MTristan SchroederC 02/17/2022, 8:07 AM [facility]

## 2022-02-17 NOTE — Progress Notes (Signed)
Notified critical level troponin 673 Dr. Beatrix Fetters through Secure chat. Continue to monitor.

## 2022-02-17 NOTE — Progress Notes (Signed)
Patient arrived respiratory distressed. Transitioned from bipap to intubation. Vent changes made per abg results after discussion with Dr Dolores Frame. NP Rust-Chester in to see patient. Additional vent changes made. Patient transferred to icu.

## 2022-02-17 NOTE — Consult Note (Signed)
ANTICOAGULATION CONSULT NOTE  Pharmacy Consult for IV Heparin Indication: chest pain/ACS  Patient Measurements: Height: 5\' 6"  (167.6 cm) Weight: 71.2 kg (156 lb 15.5 oz) IBW/kg (Calculated) : 63.8 Heparin Dosing Weight: 71.2 kg  Labs: Recent Labs    02/17/22 0209 02/17/22 0447 02/17/22 0715  HGB 18.7*  --   --   HCT 57.9*  --   --   PLT 220  --   --   CREATININE 1.02  --   --   TROPONINIHS 40* 270* 673*    Estimated Creatinine Clearance: 60.8 mL/min (by C-G formula based on SCr of 1.02 mg/dL).   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 On SAPT with Plavix at home  Assessment: Patient is a 71 y/o M with medical history as above and including CAD s/p CABG, PAD, Afib not on anticoagulation, CHF, EtOH use disorder, tobacco use disorder who presented to the ED 5/22 with respiratory distress. Patient reportedly told his spouse prior to presentation that he felt like he was having a heart attack. Initial concern was for flash pulmonary edema but patient only responded minimally to BiPAP + NTG gtt and ultimately required intubation. Patient extubated same day a short time later. Troponin elevated and there is concern for NSTEMI. Pharmacy consulted for IV heparin.  Baseline INR 1.4, aPTT 35s.  Baseline CBC notable for polycythemia.   Goal of Therapy:  Heparin level 0.3-0.7 units/ml Monitor platelets by anticoagulation protocol: Yes   Plan:  --Heparin 2000 unit IV bolus (1/2 bolus given recent administration of [place] Lovenox) followed by continuous infusion at 850 units/hr --Heparin level 8 hours after initiation of infusion --Daily CBC per protocol --Duration of anticoagulation per cardiology  6/22 02/17/2022,9:52 AM

## 2022-02-17 NOTE — Procedures (Signed)
Central Venous Catheter Insertion Procedure Note  Dennis Preston  [id number]  11/07/1950  Date:02/17/22  Time:4:52 AM   Provider Performing:Catrina Fellenz L Rust-Chester   Procedure: Insertion of Non-tunneled Central Venous [phone]) with US guidance [id number])   Indication(s) Medication administration  Consent Risks of the procedure as well as the alternatives and risks of each were explained to the patient and/or caregiver.  Consent for the procedure was obtained and is signed in the bedside chart  Anesthesia Topical lidocaine, propofol drip  Timeout Verified patient identification, verified procedure, site/side was marked, verified correct patient position, special equipment/implants available, medications/allergies/relevant history reviewed, required imaging and test results available.  Sterile Technique Maximal sterile technique including full sterile barrier drape, hand hygiene, sterile gown, sterile gloves, mask, hair covering, sterile ultrasound probe cover (if used).  Procedure Description Area of catheter insertion was cleaned with chlorhexidine and draped in sterile fashion.  With real-time ultrasound guidance a central venous catheter was placed into the left internal jugular vein. Nonpulsatile blood flow and easy flushing noted in all ports.  The catheter was sutured in place and sterile dressing applied.  Complications/Tolerance None; patient tolerated the procedure well. Chest X-ray is ordered to verify placement for internal jugular or subclavian cannulation.   Chest x-ray is not ordered for femoral cannulation.  EBL Minimal  Specimen(s) None  [name], AGACNP-BC Acute Care Nurse Practitioner [facility]   [phone] / [phone] Please see Amion for pager details.

## 2022-02-17 NOTE — H&P (Signed)
NAME:  Dennis Preston, MRN:  [id number], DOB:  1950/10/21, LOS: 0 ADMISSION DATE:  02/17/2022, CONSULTATION DATE:  02/17/22 REFERRING MD:  Dr. sung, CHIEF COMPLAINT: Respiratory Distress    History of Present Illness:  71 yo M presenting to [facility] ED from home via EMS in severe respiratory distress. Per the patient's spouse, bedside, the patient woke her up around 00:45 on 02/17/22 after going to bed around 22:00 in his recliner. He stated that he felt like he was having a heart attack. She checked on him, he was dyspneic and diaphoretic and she called EMS. She does not remember him complaining of chest pain, and believes that when EMS asked he said no. She denies any recent fever/chills, nausea/vomiting/ diarrhea, or chest pain. She does report worsening dyspnea recently (timeframe unclear) and states he has difficulty ambulating more then a few steps before becoming fatigued- however this does not appear to be new. She reports a slightly worse productive cough, not sure of sputum color.  Wife and daughters confirm that he is still smoking cigarettes, about a half a pack a day. He quit briefly about 5 years ago then relapsed. He does not use a CPAP but does wear 2 L [place] 24/7. They deny any regular ETOH or recreational drug use. Wife confirms that he has been taking all medications as prescribed including his lasix. She also reports that he has lost about 6 pounds this month.  ED course: Upon arrival patient awake but altered and in severe respiratory distress on CPAP. Due to concern for flash pulmonary edema, lasix given/ placed on BIPAP and nitroglycerin drip started. Patient had only slight improvement, continuing to suffer from severe respiratory distress and was emergently intubated requiring mechanical ventilatory support. After intubation, patient became hypotensive and was started on a levophed drip. Medications given: 80 mg Lasix, etomidate, versed 2 mg, succinylcholine, nitro drip and propofol drip  started Initial Vitals: 98.8, 17, 107, 122/87 & 99 % on CPAP Significant labs: (Labs/ Imaging personally reviewed) I, Domingo Pulse Rust-Chester, AGACNP-BC, personally viewed and interpreted this ECG. EKG Interpretation: Date: 02/17/22, EKG Time: 02:42, Rate: 100, Rhythm: ST, QRS Axis:  borderline RAD, Intervals: possible bi-atrial enlargement, ST/T Wave abnormalities: non specific T wave abnormalities, Narrative Interpretation: ST with borderline RAD and possible bi-atrial enlargement Chemistry: Na+:143, K+: 4.4, BUN/Cr.: 15/ 1.02, Serum CO2/ AG: 32/11, AST: 58 Hematology: WBC: 136., Hgb: 18.7,  Troponin: 40, BNP: 305.5, PCT: 0.14, lactic: pending COVID-19 & Influenza A/B: negative ABG: 7.26/ 70/ 408/ 31.4 CXR 02/17/22: background emphysema and chronic interstitial coarsening with suspected pulmonary edema. CT wo contrast 02/17/22: No acute intracranial abnormality  PCCM consulted for admission due to acute on chronic hypoxic/hypercarbic respiratory failure requiring mechanical ventilatory support and vasopressors for circulatory shock.  Pertinent  Medical History  Chronic HFpEF NYHA Class III CAD s/p CABG x 4 Paroxysmal A-fib PAD Carotid atherosclerosis s/p endarterectomy COPD stage IV on chronic 2 L [place] OSA Tobacco use (125 pack year history) Amaurosis fugax Lung nodule (RUL 6.7 mm & LUL nodule 5.0 mm) Liver dysfunction ? Significant Hospital Events: Including procedures, antibiotic start and stop dates in addition to other pertinent events   02/17/22: Admit to ICU with acute on chronic hypoxic/hypercarbic respiratory failure s/t suspected flash pulmonary edema vs CAP on mechanical ventilatory support and vasopressors  Interim History / Subjective:  Patient RASS: - 2, -3 on propofol drip at 20 mcg, requiring levophed at 15 mcg.  Spoke with wife at length, discussed current plan of  care. All questions and concerns answered at this time.  Objective   Blood pressure 112/78, pulse (!) 45,  temperature 98.5 F (36.9 C), resp. rate (!) 26, height 5\' 6"  (1.676 m), weight 72 kg, SpO2 100 %.        Intake/Output Summary (Last 24 hours) at 02/17/2022 0243 Last data filed at 02/17/2022 0241 Gross per 24 hour  Intake 9.3 ml  Output --  Net 9.3 ml   Filed Weights   02/17/22 0203  Weight: 72 kg    Examination: General: Adult male, critically ill, lying in bed intubated & sedated requiring mechanical ventilation, NAD HEENT: MM pink/moist, anicteric, atraumatic, neck supple Neuro: RASS: -2/-3, unable to follow commands, PERRL +3, MAE CV: s1s2 RRR, NSR on monitor, no r/m/g Pulm: Regular, non labored on PRVC 40% & PEEP 5, breath sounds coarse with expiratory wheezing-BUL & diminished-BLL GI: soft, rounded, bs x 4 GU: foley in place with clear yellow urine Skin: no no rashes/lesions noted Extremities: warm/dry, pulses + 2 R/P, +1 edema noted BLE  Resolved Hospital Problem list     Assessment & Plan:  Acute on chronic Hypoxic / Hypercapnic Respiratory Failure secondary to suspected flash pulmonary edema vs CAP in the setting of COPD Stage IV & chronic HFpEF COPD Stage IV on chronic 2 L [place] OSA  Lung nodules (RUL 6.7 mm & LUL 5 mm- Lung-RADS 3 with outpatient f/u in 6 months) FEV1 ratio 39.97%, 50% of predicted with DLCO 8.08 mL/min/mmHg - Ventilator settings: PRVC  8 mL/kg, 40% FiO2, 5 PEEP, continue ventilator support & lung protective strategies - Wean PEEP & FiO2 as tolerated, maintain SpO2 > 88% - Head of bed elevated 30 degrees, VAP protocol in place - Plateau pressures less than 30 cm H20  - Intermittent chest x-ray & ABG PRN - Daily WUA with SBT as tolerated  - Ensure adequate pulmonary hygiene  - Continue CAP coverage ceftriaxone & azithromycin - Steroids initiated: solu-medrol 40 mg BID - Budesonide nebs BID, Duo Nebs Q 6, bronchodilators PRN - PAD protocol in place: continue Fentanyl IVP & Propofol drip  Circulatory Shock Mild Leukocytosis Mildly elevated  PCT - continue levophed drip PRN to maintain MAP > 65 - initiate CAP coverage as above - trend lactic/PCT, f/u cultures, f/u Strep Pna Ur antigen & legionella - daily CBC, monitor WBC/fever curve  Acute on Chronic HFpEF exacerbation Elevated Troponin secondary to N-STEMI vs demand ischemia PMHx: HFpEF, PAF, CAD s/p CABG x 4 ECHO 01/24/22: LVEF 50-55%, normal RV fxn, trivial MR, TR, PR. Troponin: 40, BNP: 305.5 - Trend troponins  - consider heparin drip per pharmacy consult if needed - Continuous cardiac monitoring  - Strict I/O's: alert provider if UOP < 0.5 mL/kg/hr - Daily BMP, replace electrolytes PRN - Daily weights to assess volume status - Diurese with the use of IV lasix as BP and renal fxn allow - outpatient coreg on hold due to hypotension, resume once patient had stabilized - Cardiology consulted, appreciate input  PAD Hyperlipidemia PMHx: Carotid atherosclerosis s/p endarterectomy - continue Plavix - hold Lipitor as below  Mildly elevated AST PMHx: Liver dysfunction? (In history, unclear if this was transient, but no previous lab work noted in [employer] has elevated LFT's) - Trend hepatic function - Consider RUQ [place] if needed - hold Lipitor until resolved  At risk for Steroid Induced Hyperglycemia Hemoglobin A1C: pending - Monitor CBG Q 4 hours - SSI moderate dosing - target range while in ICU: 140-180 - follow ICU hyper/hypo-glycemia  protocol  Best Practice (right click and "Reselect all SmartList Selections" daily)  Diet/type: NPO w/ meds via tube DVT prophylaxis: LMWH GI prophylaxis: PPI Lines: Central line and yes and it is still needed Foley:  Yes, and it is still needed Code Status:  full code Last date of multidisciplinary goals of care discussion [02/17/22]  Labs   CBC: Recent Labs  Lab 02/17/22 0209  WBC 13.6*  NEUTROABS 6.7  HGB 18.7*  HCT 57.9*  MCV 98.5  PLT 220    Basic Metabolic Panel: No results for input(s): NA, K, CL, CO2, GLUCOSE,  BUN, CREATININE, CALCIUM, MG, PHOS in the last 168 hours. GFR: CrCl cannot be calculated (Patient's most recent lab result is older than the maximum 21 days allowed.). Recent Labs  Lab 02/17/22 0209  WBC 13.6*    Liver Function Tests: No results for input(s): AST, ALT, ALKPHOS, BILITOT, PROT, ALBUMIN in the last 168 hours. No results for input(s): LIPASE, AMYLASE in the last 168 hours. No results for input(s): AMMONIA in the last 168 hours.  ABG    Component Value Date/Time   HCO3 34.0 (H) 09/06/2020 0215   O2SAT 33.2 09/06/2020 0215     Coagulation Profile: No results for input(s): INR, PROTIME in the last 168 hours.  Cardiac Enzymes: No results for input(s): CKTOTAL, CKMB, CKMBINDEX, TROPONINI in the last 168 hours.  HbA1C: Hgb A1c MFr Bld  Date/Time Value Ref Range Status  08/27/2006 12:00 AM 5.8 %     CBG: No results for input(s): GLUCAP in the last 168 hours.  Review of Systems:   UTA- patient intubated and sedated  Past Medical History:  He,  has a past medical history of Alcohol abuse, Amaurosis fugax, Aortic atherosclerosis (HCC), Atrial fibrillation (HCC), Carotid stenosis, CHF (congestive heart failure) (HCC), Coagulopathy (HCC), COPD (chronic obstructive pulmonary disease) (HCC), Coronary artery disease, Depression, CABG (07/13/2015), Hyperlipemia, Hypertension, Leucocytosis, Liver dysfunction, Long term current use of clopidogrel, NSTEMI (non-ST elevated myocardial infarction) (HCC), Peripheral artery disease (HCC), Tobacco abuse, and Wears dentures.   Surgical History:   Past Surgical History:  Procedure Laterality Date   APPLICATION OF WOUND VAC Right 06/22/2020   Procedure: APPLICATION OF WOUND VAC;  Surgeon: Annice Needy, MD;  Location: [facility] ORS;  Service: Vascular;  Laterality: Right;   CARDIAC CATHETERIZATION N/A 07/10/2015   Procedure: Right/Left Heart Cath and Coronary Angiography;  Surgeon: Alwyn Pea, MD;  Location: [facility] INVASIVE CV LAB;   Service: Cardiovascular;  Laterality: N/A;   CORONARY ARTERY BYPASS GRAFT  07/13/2015   [facility].  4 vessel   ENDARTERECTOMY Right 01/02/2021   Procedure: ENDARTERECTOMY CAROTID;  Surgeon: Annice Needy, MD;  Location: [facility] ORS;  Service: Vascular;  Laterality: Right;   ENDARTERECTOMY FEMORAL Bilateral 05/23/2020   Procedure: ENDARTERECTOMY FEMORAL BILATERAL SUPERFICIAL FEMORAL ARTERY STENTS ;  Surgeon: Renford Dills, MD;  Location: [facility] ORS;  Service: Vascular;  Laterality: Bilateral;   FALSE ANEURYSM REPAIR Left 07/22/2020   Procedure: REPAIR LEFT FEMORAL PSEUDOANUERYSM;  Surgeon: Bertram Denver, MD;  Location: [facility] ORS;  Service: Vascular;  Laterality: Left;   INSERTION OF ILIAC STENT Left 05/23/2020   Procedure: INSERTION OF ILIAC STENT;  Surgeon: Renford Dills, MD;  Location: [facility] ORS;  Service: Vascular;  Laterality: Left;   LEFT HEART CATH AND CORS/GRAFTS ANGIOGRAPHY N/A 01/23/2020   Procedure: LEFT HEART CATH AND CORS/GRAFTS ANGIOGRAPHY;  Surgeon: Alwyn Pea, MD;  Location: [facility] INVASIVE CV LAB;  Service: Cardiovascular;  Laterality: N/A;  LOWER EXTREMITY ANGIOGRAPHY Right 04/03/2020   Procedure: LOWER EXTREMITY ANGIOGRAPHY;  Surgeon: Katha Cabal, MD;  Location: [place] CV LAB;  Service: Cardiovascular;  Laterality: Right;   OLECRANON BURSECTOMY Left 05/06/2019   Procedure: OLECRANON BURSECTOMY AND DEBRIDEMENT;  Surgeon: Leim Fabry, MD;  Location: [facility] ORS;  Service: Orthopedics;  Laterality: Left;   WOUND DEBRIDEMENT Right 06/22/2020   Procedure: DEBRIDEMENT WOUND RIGHT GROIN;  Surgeon: Algernon Huxley, MD;  Location: [facility] ORS;  Service: Vascular;  Laterality: Right;     Social History:   reports that he has been smoking cigarettes. He has a 135.00 pack-year smoking history. He has never used smokeless tobacco. He reports that he does not currently use alcohol. He reports that he does not use drugs.   Family History:  His family history includes Depression in his  sister.   Allergies Allergies  Allergen Reactions   Chlorhexidine      Home Medications  Prior to Admission medications   Medication Sig Start Date End Date Taking? Authorizing Provider  acetaminophen (TYLENOL) 325 MG tablet Take 650 mg by mouth every 6 (six) hours as needed for mild pain or headache.   Yes [provider]  albuterol (PROVENTIL) (2.5 MG/3ML) 0.083% nebulizer solution Take 3 mLs (2.5 mg total) by nebulization every 6 (six) hours as needed for wheezing or shortness of breath. 02/15/20  Yes Jennye Boroughs, MD  albuterol (VENTOLIN HFA) 108 (90 Base) MCG/ACT inhaler Inhale 1-2 puffs into the lungs every 6 (six) hours as needed for wheezing or shortness of breath. 02/15/20  Yes Jennye Boroughs, MD  atorvastatin (LIPITOR) 80 MG tablet Take 1 tablet (80 mg total) by mouth daily at 6 PM. 07/10/15  Yes Sainani, Belia Heman, MD  carvedilol (COREG) 3.125 MG tablet Take 3.125 mg by mouth 2 (two) times daily. 06/06/21  Yes [provider]  clopidogrel (PLAVIX) 75 MG tablet Take 75 mg by mouth every evening.  04/18/16  Yes [provider]  Fluticasone-Umeclidin-Vilant (TRELEGY ELLIPTA) 100-62.5-25 MCG/INH AEPB Inhale 1 puff into the lungs every other day. 01/27/20  Yes [provider]  furosemide (LASIX) 40 MG tablet Take 40 mg by mouth daily.  12/26/19  Yes [provider]  traMADol (ULTRAM) 50 MG tablet Take 1 tablet (50 mg total) by mouth every 6 (six) hours as needed. 02/01/21  Yes Kris Hartmann, NP  zolpidem (AMBIEN) 5 MG tablet Take 5 mg by mouth at bedtime as needed. 04/10/21   [provider]     Critical care time: 67 minutes       [name], AGACNP-BC Acute Care Nurse Practitioner [facility]   [phone] / [phone] Please see Amion for pager details.

## 2022-02-17 NOTE — Progress Notes (Signed)
   02/17/22 0400  Clinical Encounter Type  Visited With Patient and family together  Visit Type Initial  Referral From Nurse  Consult/Referral To Chaplain   Chaplain responded to page from ED. Chaplain provided support for wife and 3 daughters in family waiting room. Patient is critically ill and being transferred to ICU. Chaplain brought family to ICU waiting room. Chaplain provided compassionate presence and reflective listening as family spoke about situation. Chaplain let family know Chaplain services will be available as needed.

## 2022-02-17 NOTE — Evaluation (Signed)
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, SVG-OM1, SVG-PDA, SVG-PL   Hyperlipemia    Hypertension    Leucocytosis    Liver dysfunction    Long term current use of clopidogrel    NSTEMI (non-ST elevated myocardial infarction) (HCC)    approx 2016   Peripheral artery disease (HCC)    legs   Tobacco abuse    Wears dentures    full upper and lower   Past Surgical History:  Past Surgical History:  Procedure Laterality Date   APPLICATION OF WOUND VAC Right 06/22/2020   Procedure: APPLICATION OF WOUND VAC;  Surgeon: Annice Needy, MD;  Location: [facility];  Service: Vascular;  Laterality: Right;   CARDIAC CATHETERIZATION N/A 07/10/2015   Procedure: Right/Left Heart Cath and Coronary Angiography;  Surgeon: Alwyn Pea, MD;  Location: [facility];  Service: Cardiovascular;  Laterality: N/A;   CORONARY ARTERY BYPASS GRAFT  07/13/2015   [facility].  4 vessel   ENDARTERECTOMY Right 01/02/2021   Procedure: ENDARTERECTOMY CAROTID;  Surgeon: Annice Needy, MD;  Location: [facility];  Service: Vascular;  Laterality: Right;   ENDARTERECTOMY FEMORAL Bilateral 05/23/2020   Procedure: ENDARTERECTOMY FEMORAL BILATERAL SUPERFICIAL FEMORAL ARTERY STENTS ;  Surgeon: Renford Dills, MD;  Location: [facility];  Service: Vascular;  Laterality: Bilateral;   FALSE ANEURYSM REPAIR Left 07/22/2020   Procedure:  REPAIR LEFT FEMORAL PSEUDOANUERYSM;  Surgeon: Bertram Denver, MD;  Location: [facility];  Service: Vascular;  Laterality: Left;   INSERTION OF ILIAC STENT Left 05/23/2020   Procedure: INSERTION OF ILIAC STENT;  Surgeon: Renford Dills, MD;  Location: [facility];  Service: Vascular;  Laterality: Left;   LEFT HEART CATH AND CORS/GRAFTS ANGIOGRAPHY N/A 01/23/2020   Procedure: LEFT HEART CATH AND CORS/GRAFTS ANGIOGRAPHY;  Surgeon: Alwyn Pea, MD;  Location: [facility];  Service: Cardiovascular;  Laterality: N/A;   LOWER EXTREMITY ANGIOGRAPHY Right 04/03/2020   Procedure: LOWER EXTREMITY ANGIOGRAPHY;  Surgeon: Renford Dills, MD;  Location: [facility];  Service: Cardiovascular;  Laterality: Right;   OLECRANON BURSECTOMY Left 05/06/2019   Procedure: OLECRANON BURSECTOMY AND DEBRIDEMENT;  Surgeon: Signa Kell, MD;  Location: [facility];  Service: Orthopedics;  Laterality: Left;   WOUND DEBRIDEMENT Right 06/22/2020   Procedure: DEBRIDEMENT WOUND RIGHT GROIN;  Surgeon: Annice Needy, MD;  Location: [facility];  Service: Vascular;  Laterality: Right;   HPI:  Pt is a 71 yo M presenting to [facility] ED from home via EMS in severe respiratory distress. Per the patient's spouse, bedside, the patient woke her up around 00:45 on 02/17/22 after going to bed around 22:00 in his recliner. He stated that he felt like he was having a heart attack. She checked on him, he was dyspneic and diaphoretic and she called EMS. She does not remember him  complaining of chest pain, and believes that when EMS asked he said no. She denies any recent fever/chills, nausea/vomiting/ diarrhea, or chest pain. She does report worsening dyspnea recently (timeframe unclear) and states he has difficulty ambulating more then a few steps before becoming fatigued- however this does not appear to be new. She reports a slightly worse productive cough, not sure of sputum color.   Wife and daughters confirm that he is still smoking  cigarettes, about a half a pack a day. He quit briefly about 5 years ago then relapsed. He does not use a CPAP but does wear 2 L [place] 24/7. They deny any regular ETOH or recreational drug use. Wife confirms that he has been taking all medications as prescribed including his lasix. She also reports that he has lost about 6 pounds this month.     ED course: Upon arrival patient awake but altered and in severe respiratory distress on CPAP. Due to concern for flash pulmonary edema, lasix given/ placed on BIPAP and nitroglycerin drip started. Patient had only slight improvement, continuing to suffer from severe respiratory distress and was emergently intubated requiring mechanical ventilatory support. After intubation, patient became hypotensive and was started on a levophed drip.  Pt was extubated at ~10:00 this morning after only a few hours of intubation.  PMH: includes - severe peripheral arterial disease w/ recent Vascular procedure on 02/04/2022, S/P coronary artery bypass graft x 4, CAD, COPD, liver dysfunction; ETOH and tobacco abuse/use, CHF, PAD, and other multiple medical issues.    Assessment / Plan / Recommendation  Clinical Impression  Pt seen today for BSE in setting of recent Pulmonary acute exacerbation w/ need for oral intubation of <12 hours. Pt extubated and appears calm on 2L [place] O2 support. Pt is A/O x4 and follows instructions. He placed his full Dentures w/ min support and adhesive. Noted bilateral shaky UEs.   Pt appears to present w/ adequate oropharyngeal phase swallow function w/ No oropharyngeal phase dysphagia noted, No neuromuscular deficits noted. Pt consumed po trials w/ No overt, clinical s/s of aspiration during po trials. Pt appears at reduced risk for aspiration following general aspiration precautions. He would benefit from softer, easier to eat/masticate foods for conservation of energy and for ease of self-feeding at this acute time of illness.    During po trials, pt consumed  all consistencies w/ no overt coughing, decline in vocal quality, or change in respiratory presentation during/post trials. O2 sats remained in low-mid90s; RR 19-22. Rest breaks encouraged to support respiratory status. Oral phase appeared [facility] w/ timely bolus management, mastication, and control of bolus propulsion for A-P transfer for swallowing. Oral clearing achieved w/ all trial consistencies. OM Exam appeared [facility] w/ no unilateral weakness noted. Speech Clear. Pt fed self w/ setup support.   Recommend a Regular consistency diet w/ well-Cut meats, moistened foods; Thin liquids via cup as desired- pt does not use straws often at home. Recommend general aspiration precautions, Rest Breaks during meals for conservation of energy. Pills 1 at a time or WHOLE in Puree for safer, easier swallowing. Education given to pt and Family member present in room on Pills in Puree; food consistencies and easy to eat options; general aspiration precautions. NSG to reconsult if any new needs arise. NSG agreed. SLP Visit Diagnosis: Dysphagia, unspecified (R13.10)    Aspiration Risk   (reduced following general precautions)    Diet Recommendation   Regular consistency diet w/ well-Cut meats, moistened foods; Thin liquids via cup as desired-  pt does not use straws often at home. Recommend general aspiration precautions, Rest Breaks during meals for conservation of energy.  Medication Administration: Whole meds with liquid (1 at a time; or w/ puree)    Other  Recommendations Recommended Consults:  (Dietician f/u) Oral Care Recommendations: Oral care BID;Oral care before and after PO;Patient independent with oral care (setup support - denture care) Other Recommendations:  (n/a)    Recommendations for follow up therapy are one component of a multi-disciplinary discharge planning process, led by the attending physician.  Recommendations may be updated based on patient status, additional functional criteria and insurance  authorization.  Follow up Recommendations No SLP follow up      Assistance Recommended at Discharge PRN  Functional Status Assessment Patient has had a recent decline in their functional status and demonstrates the ability to make significant improvements in function in a reasonable and predictable amount of time.  Frequency and Duration  (n/a)   (n/a)       Prognosis Prognosis for Safe Diet Advancement: Good Barriers to Reach Goals:  (n/a)      Swallow Study   General Date of Onset: 02/17/22 HPI: Pt is a 71 yo M presenting to [facility] ED from home via EMS in severe respiratory distress. Per the patient's spouse, bedside, the patient woke her up around 00:45 on 02/17/22 after going to bed around 22:00 in his recliner. He stated that he felt like he was having a heart attack. She checked on him, he was dyspneic and diaphoretic and she called EMS. She does not remember him complaining of chest pain, and believes that when EMS asked he said no. She denies any recent fever/chills, nausea/vomiting/ diarrhea, or chest pain. She does report worsening dyspnea recently (timeframe unclear) and states he has difficulty ambulating more then a few steps before becoming fatigued- however this does not appear to be new. She reports a slightly worse productive cough, not sure of sputum color.   Wife and daughters confirm that he is still smoking cigarettes, about a half a pack a day. He quit briefly about 5 years ago then relapsed. He does not use a CPAP but does wear 2 L [place] 24/7. They deny any regular ETOH or recreational drug use. Wife confirms that he has been taking all medications as prescribed including his lasix. She also reports that he has lost about 6 pounds this month.     ED course: Upon arrival patient awake but altered and in severe respiratory distress on CPAP. Due to concern for flash pulmonary edema, lasix given/ placed on BIPAP and nitroglycerin drip started. Patient had only slight improvement,  continuing to suffer from severe respiratory distress and was emergently intubated requiring mechanical ventilatory support. After intubation, patient became hypotensive and was started on a levophed drip.  Pt was extubated at ~10:00 this morning after only a few hours of intubation.  PMH: includes - severe peripheral arterial disease w/ recent Vascular procedure on 02/04/2022, S/P coronary artery bypass graft x 4, CAD, COPD, liver dysfunction; ETOH abuse, CHF, and other multiple medical issues. Type of Study: Bedside Swallow Evaluation Previous Swallow Assessment: none Diet Prior to this Study: NPO (regular at home) Temperature Spikes Noted: No (troponins elevated) Respiratory Status: Nasal cannula (2L) History of Recent Intubation: Yes Date extubated: 02/17/22 (post <12 hours) Behavior/Cognition: Alert;Cooperative;Pleasant mood;Distractible (min; talkative) Oral Cavity Assessment: Within Functional Limits Oral Care Completed by SLP: Yes Oral Cavity - Dentition: Dentures, top;Dentures, bottom (placed w/ adhesive) Vision: Functional for self-feeding  Self-Feeding Abilities: Able to feed self;Needs set up Patient Positioning: Upright in bed (supported) Baseline Vocal Quality: Normal Volitional Cough: Strong Volitional Swallow: Able to elicit    Oral/Motor/Sensory Function Overall Oral Motor/Sensory Function: Within functional limits   Ice Chips Ice chips: Within functional limits Presentation: Spoon (fed; x2)   Thin Liquid Thin Liquid: Within functional limits Presentation: Cup;Self Fed;Straw (~8 ozs total)    Nectar Thick Nectar Thick Liquid: Not tested   Honey Thick Honey Thick Liquid: Not tested   Puree Puree: Within functional limits Presentation: Self Fed;Spoon (4 trials)   Solid     Solid: Within functional limits Presentation: Self Fed (10+ trials)        Jerilynn SomKatherine Damonta Cossey, MS, CCC-SLP Speech Language Pathologist Rehab Services; [facility] [place] [phone]  (ascom) Carnell Beavers 02/17/2022,3:10 PM

## 2022-02-17 NOTE — ED Notes (Signed)
Report to [place], [place]

## 2022-02-17 NOTE — Progress Notes (Signed)
eLink Physician-Brief Progress Note Patient Name: Dennis Preston DOB: Sep 17, 1951 MRN: [id number]   Date of Service  02/17/2022  HPI/Events of Note  Patient admitted with altered mental status and acute hypercapnic respiratory failure, he was intubated in the ED after failing BIPAP, CXR and laboratory data consistent with acute exacerbation of COPD + mild congestive heart failure, baseline Troponin is modestly elevated. CT head is without acute abnormality.  eICU Interventions  New Patient Evaluation.        Migdalia Dk 02/17/2022, 4:46 AM

## 2022-02-17 NOTE — ED Provider Notes (Signed)
[facility] Provider Note    Event Date/Time   First MD Initiated Contact with Patient 02/17/22 0203     (approximate)   History   Respiratory Distress   HPI  Level V caveat: Limited by distress  Dennis Preston is a 71 y.o. male brought to the ED via EMS from home with a chief complaint of severe respiratory distress.  Patient with a history of CAD status post MI, CHF, atrial fibrillation whose wife reports sudden respiratory distress tonight associated with chest pain.  Wife reports patient takes Plavix.  Patient arrives to the ED on CPAP, altered mentation, eyes open spontaneously, minimally following commands.  Rest of history is limited secondary to patient's distress.     Past Medical History   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, SVG-OM1, SVG-PDA, SVG-PL   Hyperlipemia    Hypertension    Leucocytosis    Liver dysfunction    Long term current use of clopidogrel    NSTEMI (non-ST elevated myocardial infarction) ([place])    approx 2016   Peripheral artery disease (HCC)    legs   Tobacco abuse    Wears dentures    full upper and lower     Active Problem List   Patient Active Problem List   Diagnosis Date Noted   Acute on chronic respiratory failure ([place]) 02/17/2022   S/P carotid endarterectomy 01/16/2021   Carotid stenosis, right 01/02/2021   Essential hypertension 12/18/2020   Hyperlipidemia 12/18/2020   Acute respiratory failure ([place]) 09/06/2020   CHF (congestive heart failure) (HCC)    Hypokalemia    Pseudoaneurysm of femoral artery ([place]) 07/22/2020   Atherosclerosis of artery of extremity with rest pain ([place])  05/23/2020   Chronic venous insufficiency 03/01/2020   COPD exacerbation ([place]) 02/13/2020   Aortic atherosclerosis ([place]) 08/08/2019   COPD (chronic obstructive pulmonary disease) with chronic bronchitis ([place]) 08/08/2019   COPD with acute exacerbation ([place]) 06/13/2018   Tobacco use 01/14/2017   A-fib ([place]) 07/18/2015   Coagulopathy ([place]) 07/17/2015   Hypotension 07/14/2015   Leukocytosis 07/14/2015   PAD (peripheral artery disease) ([place]) 07/14/2015   S/P coronary artery bypass graft x 4 07/14/2015   Carotid atherosclerosis, bilateral 07/12/2015   Transient visual loss of right eye 07/11/2015   NSTEMI (non-ST elevated myocardial infarction) ([place]) 07/10/2015   Amaurosis fugax 07/10/2015   Coronary artery disease 07/10/2015   Hypoxia 07/09/2015   ANXIETY 02/24/2007   ERECTILE DYSFUNCTION 02/24/2007   LIVER FUNCTION TESTS, ABNORMAL 02/24/2007     Past Surgical History   Past Surgical History:  Procedure Laterality Date   APPLICATION OF WOUND VAC Right 06/22/2020   Procedure: APPLICATION OF WOUND VAC;  Surgeon: Algernon Huxley, MD;  Location: [facility] OR[facility];  Service: Vascular;  Laterality: Right;   CARDIAC CATHETERIZATION N/A 07/10/2015   Procedure: Right/Left Heart Cath and Coronary Angiography;  Surgeon: Yolonda Kida, MD;  Location: [place] [facility];  Service: Cardiovascular;  Laterality: N/A;   CORONARY ARTERY BYPASS GRAFT  07/13/2015   [facility].  4 vessel   ENDARTERECTOMY Right 01/02/2021   Procedure:  ENDARTERECTOMY CAROTID;  Surgeon: Algernon Huxley, MD;  Location: [facility];  Service: Vascular;  Laterality: Right;   ENDARTERECTOMY FEMORAL Bilateral 05/23/2020   Procedure: ENDARTERECTOMY FEMORAL BILATERAL SUPERFICIAL FEMORAL ARTERY STENTS ;  Surgeon: Katha Cabal, MD;  Location: [facility];  Service: Vascular;  Laterality: Bilateral;   FALSE ANEURYSM REPAIR Left 07/22/2020   Procedure: REPAIR LEFT FEMORAL PSEUDOANUERYSM;  Surgeon: Evaristo Bury, MD;  Location: [facility] OR[facility];  Service:  Vascular;  Laterality: Left;   INSERTION OF ILIAC STENT Left 05/23/2020   Procedure: INSERTION OF ILIAC STENT;  Surgeon: Katha Cabal, MD;  Location: [facility];  Service: Vascular;  Laterality: Left;   LEFT HEART CATH AND CORS/GRAFTS ANGIOGRAPHY N/A 01/23/2020   Procedure: LEFT HEART CATH AND CORS/GRAFTS ANGIOGRAPHY;  Surgeon: Yolonda Kida, MD;  Location: [place] [facility];  Service: Cardiovascular;  Laterality: N/A;   LOWER EXTREMITY ANGIOGRAPHY Right 04/03/2020   Procedure: LOWER EXTREMITY ANGIOGRAPHY;  Surgeon: Katha Cabal, MD;  Location: [place] [facility];  Service: Cardiovascular;  Laterality: Right;   OLECRANON BURSECTOMY Left 05/06/2019   Procedure: OLECRANON BURSECTOMY AND DEBRIDEMENT;  Surgeon: Leim Fabry, MD;  Location: [facility];  Service: Orthopedics;  Laterality: Left;   WOUND DEBRIDEMENT Right 06/22/2020   Procedure: DEBRIDEMENT WOUND RIGHT GROIN;  Surgeon: Algernon Huxley, MD;  Location: [facility];  Service: Vascular;  Laterality: Right;     Home Medications   Prior to Admission medications   Medication Sig Start Date End Date Taking? Authorizing Provider  acetaminophen (TYLENOL) 325 MG tablet Take 650 mg by mouth every 6 (six) hours as needed for mild pain or headache.   Yes [provider]  albuterol (PROVENTIL) (2.5 MG/3ML) 0.083% nebulizer solution Take 3 mLs (2.5 mg total) by nebulization every 6 (six) hours as needed for wheezing or shortness of breath. 02/15/20  Yes Jennye Boroughs, MD  albuterol (VENTOLIN HFA) 108 (90 Base) MCG/ACT inhaler Inhale 1-2 puffs into the lungs every 6 (six) hours as needed for wheezing or shortness of breath. 02/15/20  Yes Jennye Boroughs, MD  atorvastatin (LIPITOR) 80 MG tablet Take 1 tablet (80 mg total) by mouth daily at 6 PM. 07/10/15  Yes Sainani, Belia Heman, MD  carvedilol (COREG) 3.125 MG tablet Take 3.125 mg by mouth 2 (two) times daily. 06/06/21  Yes [provider]  clopidogrel (PLAVIX) 75 MG tablet Take 75 mg  by mouth every evening.  04/18/16  Yes [provider]  Fluticasone-Umeclidin-Vilant (TRELEGY ELLIPTA) 100-62.5-25 MCG/INH AEPB Inhale 1 puff into the lungs every other day. 01/27/20  Yes [provider]  furosemide (LASIX) 40 MG tablet Take 40 mg by mouth daily.  12/26/19  Yes [provider]  traMADol (ULTRAM) 50 MG tablet Take 1 tablet (50 mg total) by mouth every 6 (six) hours as needed. 02/01/21  Yes Kris Hartmann, NP  zolpidem (AMBIEN) 5 MG tablet Take 5 mg by mouth at bedtime as needed. 04/10/21   [provider]     Allergies  Chlorhexidine   Family History   Family History  Problem Relation Age of Onset   Depression Sister      Physical Exam  Triage Vital Signs: ED Triage Vitals  Enc Vitals Group     BP 02/17/22 0202 (!) 225/134     Pulse Rate 02/17/22 0202 (!) 126     Resp 02/17/22 0202 (!) 40     Temp --      Temp src --  SpO2 02/17/22 0202 (!) 84 %     Weight 02/17/22 0203 158 lb 11.7 oz (72 kg)     Height 02/17/22 0203 5\' 6"  (1.676 m)     Head Circumference --      Peak Flow --      Pain Score --      Pain Loc --      Pain Edu? --      Excl. in [place]? --     Updated Vital Signs: BP 129/77   Pulse (!) 58   Temp (!) 96.6 F (35.9 C)   Resp 17   Ht 5\' 6"  (1.676 m)   Wt 71.2 kg   SpO2 96%   BMI 25.34 kg/m    General: Awake, severe distress.  CV:  Tachycardic.  [place] peripheral perfusion.  Resp:  Increased effort.  Diffuse rales.  Diminished aeration. Abd:  No distention.  Other:  Diaphoretic.  Minimally following commands.   ED Results / Procedures / Treatments  Labs (all labs ordered are listed, but only abnormal results are displayed) Labs Reviewed  CBC WITH DIFFERENTIAL/PLATELET - Abnormal; Notable for the following components:      Result Value   WBC 13.6 (*)    RBC 5.88 (*)    Hemoglobin 18.7 (*)    HCT 57.9 (*)    Lymphs Abs 4.4 (*)    Monocytes Absolute 1.6 (*)    Eosinophils Absolute 0.8 (*)     All other components within normal limits  COMPREHENSIVE METABOLIC PANEL - Abnormal; Notable for the following components:   Glucose, Bld 179 (*)    AST 58 (*)    All other components within normal limits  BRAIN NATRIURETIC PEPTIDE - Abnormal; Notable for the following components:   B Natriuretic Peptide 305.5 (*)    All other components within normal limits  BLOOD GAS, ARTERIAL - Abnormal; Notable for the following components:   pH, Arterial 7.26 (*)    pCO2 arterial 70 (*)    pO2, Arterial 408 (*)    Bicarbonate 31.4 (*)    All other components within normal limits  MAGNESIUM - Abnormal; Notable for the following components:   Magnesium 2.8 (*)    All other components within normal limits  PHOSPHORUS - Abnormal; Notable for the following components:   Phosphorus 5.2 (*)    All other components within normal limits  URINE DRUG SCREEN, QUALITATIVE ([facility] ONLY) - Abnormal; Notable for the following components:   Benzodiazepine, Ur Scrn POSITIVE (*)    All other components within normal limits  URINALYSIS, ROUTINE W REFLEX MICROSCOPIC - Abnormal; Notable for the following components:   Color, Urine YELLOW (*)    APPearance HAZY (*)    Glucose, UA 50 (*)    Hgb urine dipstick SMALL (*)    Protein, ur 100 (*)    All other components within normal limits  BLOOD GAS, ARTERIAL - Abnormal; Notable for the following components:   pCO2 arterial 52 (*)    Bicarbonate 30.8 (*)    Acid-Base Excess 4.0 (*)    All other components within normal limits  TROPONIN I (HIGH SENSITIVITY) - Abnormal; Notable for the following components:   Troponin I (High Sensitivity) 40 (*)    All other components within normal limits  TROPONIN I (HIGH SENSITIVITY) - Abnormal; Notable for the following components:   Troponin I (High Sensitivity) 270 (*)    All other components within normal limits  RESP PANEL BY RT-PCR (FLU A&B, COVID)  ARPGX2  RESPIRATORY PANEL BY PCR  CULTURE, RESPIRATORY W GRAM STAIN   PROCALCITONIN  LACTIC ACID, PLASMA  HEMOGLOBIN A1C  STREP PNEUMONIAE URINARY ANTIGEN  LEGIONELLA PNEUMOPHILA SEROGP 1 UR AG     EKG  ED ECG REPORT I, Demetri Kerman J, the attending physician, personally viewed and interpreted this ECG.   Date: 02/17/2022  EKG Time: 0200  Rate: 122  Rhythm: sinus tachycardia  Axis: LAD  Intervals:nonspecific intraventricular conduction delay  ST&T Change: Nonspecific  ED ECG REPORT I, Siana Panameno J, the attending physician, personally viewed and interpreted this ECG.   Date: 02/17/2022  EKG Time: 0219  Rate: 108  Rhythm: sinus tachycardia  Axis: LAD  Intervals:second-degree A-V block, ( Mobitz II )  ST&T Change: Nonspecific  ED ECG REPORT I, Dianna Ewald J, the attending physician, personally viewed and interpreted this ECG.   Date: 02/17/2022  EKG Time: 0242  Rate: 100  Rhythm: sinus tachycardia  Axis: LAD  Intervals:nonspecific intraventricular conduction delay  ST&T Change: Nonspecific      RADIOLOGY I have independently visualized and interpreted patient's chest x-ray as well as noted the radiology interpretation:  Chest x-ray: CHF  Postintubation chest x-ray: ET tube in good position  Official radiology report(s): DG Chest 1 View  Result Date: 02/17/2022 CLINICAL DATA:  Respiratory failure EXAM: CHEST  1 VIEW COMPARISON:  None Available. FINDINGS: Endotracheal tube roughly 7 cm above the carina. Nasogastric tube extends in the upper abdomen beyond the margin of the examination. The lungs are symmetrically hyperinflated in keeping with changes of underlying COPD. Pulmonary insufflation is stable since prior examination. Superimposed perihilar interstitial pulmonary infiltrate persists likely reflecting mild perihilar pulmonary edema, possibly cardiogenic in nature. No confluent pulmonary infiltrate. No pneumothorax or pleural effusion. Left costophrenic angle is partially excluded from view. Coronary artery bypass grafting has  been performed. Cardiac size within normal limits. IMPRESSION: Support tubes in appropriate position. COPD. Persistent mild perihilar pulmonary edema, possibly cardiogenic in nature. Electronically Signed   By: Fidela Salisbury M.D.   On: 02/17/2022 03:35   DG Abdomen 1 View  Result Date: 02/17/2022 CLINICAL DATA:  For gastric tube placement EXAM: ABDOMEN - 1 VIEW COMPARISON:  None Available. FINDINGS: Orogastric tube tip is seen overlying the expected gastric fundus. Nonobstructive bowel gas pattern. No gross free intraperitoneal gas. IMPRESSION: Orogastric tube tip within the expected gastric fundus. Electronically Signed   By: Fidela Salisbury M.D.   On: 02/17/2022 03:40   CT Head Wo Contrast  Result Date: 02/17/2022 CLINICAL DATA:  Respiratory distress, diaphoretic, altered mental status EXAM: CT HEAD WITHOUT CONTRAST TECHNIQUE: Contiguous axial images were obtained from the base of the skull through the vertex without intravenous contrast. RADIATION DOSE REDUCTION: This exam was performed according to the departmental dose-optimization program which includes automated exposure control, adjustment of the mA and/or kV according to patient size and/or use of iterative reconstruction technique. COMPARISON:  04/19/2020 FINDINGS: Brain: No evidence of acute infarction, hemorrhage, cerebral edema, mass, mass effect, or midline shift. No hydrocephalus or extra-axial fluid collection. Periventricular white matter changes, likely the sequela of chronic small vessel ischemic disease. Vascular: No hyperdense vessel. Atherosclerotic calcifications in the intracranial carotid and vertebral arteries. Skull: Normal. Negative for fracture or focal lesion. Sinuses/Orbits: Mucosal thickening in the frontal sinuses and ethmoid air cells. The orbits are unremarkable. Other: The mastoid air cells are well aerated. IMPRESSION: No acute intracranial process. No etiology seen for the patient's altered mental status. Electronically  Signed   By: Francetta Found.D.  On: 02/17/2022 03:53   DG Chest Port 1 View  Result Date: 02/17/2022 CLINICAL DATA:  Check central line positioning. EXAM: PORTABLE CHEST 1 VIEW COMPARISON:  Portable chest earlier today at 2:50 a.m. FINDINGS: 4:50 a.m., 02/17/2022. ETT has been advanced with tip now 6.6 cm from the carina. A left IJ central line has been placed and its tip is in the SVC at the azygous confluence. Enteric tube is better visualized on the current than prior study. The side-hole is at the hiatus and would recommend advancing the tube further into the stomach 8-10 cm. The cardiac size is normal with again noted transverse aortic atherosclerosis, CABG. Central vascular prominence appears similar as well as COPD and coarse chronic changes. There is no overt edema , no focal lung infiltrate is seen. No pleural effusion is evident. Osteopenia and thoracic spondylosis. IMPRESSION: 1. Left IJ line tip is in the SVC at the azygous confluence. There is no pneumothorax. 2. NGT should be advanced further into the stomach 8-10 cm. 3. COPD, chronic lung changes. No appreciable focal infiltrate. 4. Stable mild central vascular prominence. Electronically Signed   By: Telford Nab M.D.   On: 02/17/2022 05:27   DG Chest Port 1 View  Result Date: 02/17/2022 CLINICAL DATA:  Shortness of breath. EXAM: PORTABLE CHEST 1 VIEW COMPARISON:  Chest radiograph dated 02/24/2021 and CT dated 01/03/2022. FINDINGS: Background of emphysema and chronic interstitial coarsening. No focal consolidation, pleural effusion, pneumothorax. The cardiac silhouette is within normal limits. Mediastinal wires and CABG vascular clips. No acute osseous pathology. IMPRESSION: No acute cardiopulmonary process. Electronically Signed   By: Anner Crete M.D.   On: 02/17/2022 02:54     PROCEDURES:  Critical Care performed: Yes, see critical care procedure note(s)  .1-3 Lead EKG Interpretation Performed by: Paulette Blanch,  MD Authorized by: Paulette Blanch, MD     Interpretation: abnormal     ECG rate:  122   ECG rate assessment: tachycardic     Rhythm: sinus tachycardia     Ectopy: none     Conduction: normal   Comments:     Patient placed on cardiac monitor to evaluate for arrhythmias Procedure Name: Intubation Date/Time: 02/17/2022 2:46 AM Performed by: Paulette Blanch, MD Pre-anesthesia Checklist: Patient identified, Patient being monitored, Emergency Drugs available, Timeout performed and Suction available Oxygen Delivery Method: Ambu bag Preoxygenation: Pre-oxygenation with 100% oxygen Induction Type: Rapid sequence Ventilation: Mask ventilation without difficulty Laryngoscope Size: Glidescope and 3 Grade View: Grade I Tube size: 7.5 mm Number of attempts: 1 Airway Equipment and Method: Rigid stylet Placement Confirmation: ETT inserted through vocal cords under direct vision, CO2 detector, Breath sounds checked- equal and bilateral and Positive ETCO2 Dental Injury: Teeth and Oropharynx as per pre-operative assessment    CRITICAL CARE Performed by: Paulette Blanch   Total critical care time: 45 minutes  Critical care time was exclusive of separately billable procedures and treating other patients.  Critical care was necessary to treat or prevent imminent or life-threatening deterioration.  Critical care was time spent personally by me on the following activities: development of treatment plan with patient and/or surrogate as well as nursing, discussions with consultants, evaluation of patient's response to treatment, examination of patient, obtaining history from patient or surrogate, ordering and performing treatments and interventions, ordering and review of laboratory studies, ordering and review of radiographic studies, pulse oximetry and re-evaluation of patient's condition.    MEDICATIONS ORDERED IN ED: Medications  nitroGLYCERIN 50 mg in dextrose 5 %  250 mL (0.2 mg/mL) infusion (0 mcg/min  Intravenous Stopped 02/17/22 0241)  propofol (DIPRIVAN) 1000 MG/100ML infusion (40 mcg/kg/min  72 kg Intravenous IV Pump Association 02/17/22 0534)  midazolam (VERSED) injection 2 mg (2 mg Intravenous Not Given 02/17/22 0256)  docusate (COLACE) 50 MG/5ML liquid 100 mg (has no administration in time range)  polyethylene glycol (MIRALAX / GLYCOLAX) packet 17 g (has no administration in time range)  docusate (COLACE) 50 MG/5ML liquid 100 mg (has no administration in time range)  polyethylene glycol (MIRALAX / GLYCOLAX) packet 17 g (has no administration in time range)  pantoprazole sodium (PROTONIX) 40 mg/20 mL oral suspension 40 mg (has no administration in time range)  enoxaparin (LOVENOX) injection 40 mg (has no administration in time range)  insulin aspart (novoLOG) injection 0-15 Units (5 Units Subcutaneous Given 02/17/22 0408)  norepinephrine (LEVOPHED) 4mg  in 233mL (0.016 mg/mL) premix infusion (2 mcg/min Intravenous IV Pump Association 02/17/22 0532)  budesonide (PULMICORT) nebulizer solution 0.25 mg (has no administration in time range)  ipratropium-albuterol (DUONEB) 0.5-2.5 (3) MG/3ML nebulizer solution 3 mL (has no administration in time range)  ipratropium-albuterol (DUONEB) 0.5-2.5 (3) MG/3ML nebulizer solution 3 mL (has no administration in time range)  heparin 25000 UT/250ML infusion (  Not Given 02/17/22 0319)  fentaNYL 2526mcg in NS 221mL (86mcg/ml) infusion-PREMIX (50 mcg/hr Intravenous Infusion Verify 02/17/22 0500)  methylPREDNISolone sodium succinate (SOLU-MEDROL) 40 mg/mL injection 40 mg (40 mg Intravenous Given 02/17/22 0408)  fentaNYL (SUBLIMAZE) bolus via infusion 50 mcg (has no administration in time range)  acetaminophen (TYLENOL) tablet 650 mg (has no administration in time range)  clopidogrel (PLAVIX) tablet 75 mg (has no administration in time range)  azithromycin (ZITHROMAX) 500 mg in sodium chloride 0.9 % 250 mL IVPB (has no administration in time range)  cefTRIAXone  (ROCEPHIN) 1 g in sodium chloride 0.9 % 100 mL IVPB (1 g Intravenous New Bag/Given 02/17/22 0511)  ipratropium-albuterol (DUONEB) 0.5-2.5 (3) MG/3ML nebulizer solution (  Given 02/17/22 0208)  furosemide (LASIX) injection 40 mg (40 mg Intravenous Given 02/17/22 0206)  etomidate (AMIDATE) injection 20 mg (20 mg Intravenous Given 02/17/22 0231)  succinylcholine (ANECTINE) syringe 200 mg (200 mg Intravenous Given 02/17/22 0232)  furosemide (LASIX) injection 40 mg (40 mg Intravenous Given 02/17/22 0241)  midazolam (VERSED) injection 2 mg (2 mg Intravenous Given 02/17/22 0255)     IMPRESSION / MDM / ASSESSMENT AND PLAN / ED COURSE  I reviewed the triage vital signs and the nursing notes.                             71 year old male presenting with severe respiratory distress.  I have identified this patient to be suffering from a potentially life-threatening condition. Differential includes, but is not limited to, viral syndrome, bronchitis including COPD exacerbation, pneumonia, reactive airway disease including asthma, CHF including exacerbation with or without pulmonary/interstitial edema, pneumothorax, ACS, thoracic trauma, and pulmonary embolism.  I have personally reviewed patient's records and see that he had a vascular surgery office visit on 02/04/2022 for rest pain.  The patient is on the cardiac monitor to evaluate for evidence of arrhythmia and/or significant heart rate changes.  Will place on BiPAP, administer 40 mg IV Lasix, start nitroglycerin drip.  If patient does not improve will intubate.  Clinical Course as of 02/17/22 0600  Mon Feb 17, 2022  0235 Patient only had slight improvement on BiPAP; continues to be in severe respiratory distress with altered mentation.  Wife at bedside who states patient is full code.  Will intubate. [JS]  [id number] Discussed with ICU intensivist currently who will evaluate patient in the ED.  We will add additional 40 mg IV Lasix for diuresis. [JS]    Clinical  Course User Index [JS] Paulette Blanch, MD     FINAL CLINICAL IMPRESSION(S) / ED DIAGNOSES   Final diagnoses:  Respiratory distress  Acute pulmonary edema ([place])  Hypertensive urgency  Acute respiratory failure with hypoxia and hypercarbia ([place])     Rx / DC Orders   ED Discharge Orders     None        Note:  This document was prepared using Dragon voice recognition software and may include unintentional dictation errors.   Paulette Blanch, MD 02/17/22 0600

## 2022-02-17 NOTE — IPAL (Signed)
  Interdisciplinary Goals of Care Family Meeting   Date carried out: 02/17/2022  Location of the meeting: Bedside  Member's involved: Physician, Bedside Registered Nurse, and Family Member or next of kin    GOALS OF CARE DISCUSSION  The Clinical status was relayed to family in detail-  Updated and notified of patients medical condition- Has h/o MI and COPD Diagnosis now is COPD with NSTEMI Plan for trial of extubation  PATIENT REMAINS FULL CODE  Family understands the situation.   Family are satisfied with Plan of action and management. All questions answered  Additional CC time 25 mins   Remmy Crass Santiago Glad, M.D.  [facility]  Medical Director [facility] Medical Director [facility] Cardio-Pulmonary Department

## 2022-02-17 NOTE — Progress Notes (Signed)
Nutrition Brief Note  Patient identified on the Malnutrition Screening Tool (MST) Report  Wt Readings from Last 15 Encounters:  02/17/22 71.2 kg  02/04/22 70.3 kg  10/13/21 72.6 kg  09/17/21 73.7 kg  06/18/21 72.6 kg  02/23/21 72.6 kg  02/01/21 71.7 kg  01/02/21 75.5 kg  12/26/20 72.1 kg  12/18/20 72.9 kg  09/25/20 71.2 kg  09/13/20 70.4 kg  09/08/20 73.7 kg  08/30/20 71.7 kg  08/14/20 72.6 kg   Pt admitted with respiratory distress secondary to COPD exacerbation.   Case discussed with MD, RN, and during ICU rounds. Pt extubated today and placed on a diet following swallow screen.   RD will liberalize diet for wider variety of meal selections  Current diet order is Heart Healthy, patient is consuming approximately n/a% of meals at this time. Labs and medications reviewed.   No nutrition interventions warranted at this time. If nutrition issues arise, please consult RD.   Levada Schilling, RD, LDN, CDCES Registered Dietitian II Certified Diabetes Care and Education Specialist Please refer to [facility] for RD and/or RD on-call/weekend/after hours pager

## 2022-02-17 NOTE — Progress Notes (Signed)
*  PRELIMINARY RESULTS* Echocardiogram 2D Echocardiogram has been performed.  Dennis Preston 02/17/2022, 2:08 PM

## 2022-02-17 NOTE — ED Triage Notes (Signed)
Pt arrives in severe resp distress, pt is cpap by ems, diaphoretic, poor peripheral perfusion. Md at bedside

## 2022-02-18 ENCOUNTER — Encounter: Payer: Self-pay | Admitting: Pulmonary Disease

## 2022-02-18 ENCOUNTER — Other Ambulatory Visit: Payer: Self-pay

## 2022-02-18 ENCOUNTER — Encounter
Admission: EM | Disposition: A | Discharge status: Other Acute Inpatient Hospital | Payer: Self-pay | Source: Home / Self Care | Attending: Internal Medicine

## 2022-02-18 DIAGNOSIS — I5033 Acute on chronic diastolic (congestive) heart failure: Secondary | ICD-10-CM | POA: Diagnosis not present

## 2022-02-18 DIAGNOSIS — J9621 Acute and chronic respiratory failure with hypoxia: Secondary | ICD-10-CM | POA: Diagnosis not present

## 2022-02-18 DIAGNOSIS — I2510 Atherosclerotic heart disease of native coronary artery without angina pectoris: Secondary | ICD-10-CM

## 2022-02-18 DIAGNOSIS — Z9861 Coronary angioplasty status: Secondary | ICD-10-CM | POA: Diagnosis not present

## 2022-02-18 DIAGNOSIS — J9622 Acute and chronic respiratory failure with hypercapnia: Secondary | ICD-10-CM

## 2022-02-18 HISTORY — PX: LEFT HEART CATH AND CORS/GRAFTS ANGIOGRAPHY: CATH118250

## 2022-02-18 LAB — GLUCOSE, CAPILLARY
Glucose-Capillary: 118 mg/dL — ABNORMAL HIGH (ref 70–99)
Glucose-Capillary: 128 mg/dL — ABNORMAL HIGH (ref 70–99)
Glucose-Capillary: 146 mg/dL — ABNORMAL HIGH (ref 70–99)
Glucose-Capillary: 151 mg/dL — ABNORMAL HIGH (ref 70–99)
Glucose-Capillary: 161 mg/dL — ABNORMAL HIGH (ref 70–99)
Glucose-Capillary: 184 mg/dL — ABNORMAL HIGH (ref 70–99)
Glucose-Capillary: 200 mg/dL — ABNORMAL HIGH (ref 70–99)

## 2022-02-18 LAB — COMPREHENSIVE METABOLIC PANEL WITH GFR
ALT: 22 U/L (ref 0–44)
AST: 27 U/L (ref 15–41)
Albumin: 3.4 g/dL — ABNORMAL LOW (ref 3.5–5.0)
Alkaline Phosphatase: 59 U/L (ref 38–126)
Anion gap: 6 (ref 5–15)
BUN: 21 mg/dL (ref 8–23)
CO2: 33 mmol/L — ABNORMAL HIGH (ref 22–32)
Calcium: 8.7 mg/dL — ABNORMAL LOW (ref 8.9–10.3)
Chloride: 100 mmol/L (ref 98–111)
Creatinine, Ser: 1.02 mg/dL (ref 0.61–1.24)
GFR, Estimated: >60 mL/min
Glucose, Bld: 131 mg/dL — ABNORMAL HIGH (ref 70–99)
Potassium: 3.8 mmol/L (ref 3.5–5.1)
Sodium: 139 mmol/L (ref 135–145)
Total Bilirubin: 0.7 mg/dL (ref 0.3–1.2)
Total Protein: 5.4 g/dL — ABNORMAL LOW (ref 6.5–8.1)

## 2022-02-18 LAB — CBC
HCT: 43.9 % (ref 39.0–52.0)
HCT: 44.3 % (ref 39.0–52.0)
Hemoglobin: 14.3 g/dL (ref 13.0–17.0)
Hemoglobin: 14.5 g/dL (ref 13.0–17.0)
MCH: 31.2 pg (ref 26.0–34.0)
MCH: 30.7 pg (ref 26.0–34.0)
MCHC: 32.6 g/dL (ref 30.0–36.0)
MCHC: 32.7 g/dL (ref 30.0–36.0)
MCV: 95.6 fL (ref 80.0–100.0)
MCV: 93.7 fL (ref 80.0–100.0)
Platelets: 133 K/uL — ABNORMAL LOW (ref 150–400)
Platelets: 138 10*3/uL — ABNORMAL LOW (ref 150–400)
RBC: 4.59 MIL/uL (ref 4.22–5.81)
RBC: 4.73 MIL/uL (ref 4.22–5.81)
RDW: 15.1 % (ref 11.5–15.5)
RDW: 15 % (ref 11.5–15.5)
WBC: 12.3 K/uL — ABNORMAL HIGH (ref 4.0–10.5)
WBC: 12.2 10*3/uL — ABNORMAL HIGH (ref 4.0–10.5)
nRBC: 0 % (ref 0.0–0.2)
nRBC: 0 % (ref 0.0–0.2)

## 2022-02-18 LAB — BASIC METABOLIC PANEL
Anion gap: 7 (ref 5–15)
BUN: 18 mg/dL (ref 8–23)
CO2: 30 mmol/L (ref 22–32)
Calcium: 8.5 mg/dL — ABNORMAL LOW (ref 8.9–10.3)
Chloride: 101 mmol/L (ref 98–111)
Creatinine, Ser: 0.9 mg/dL (ref 0.61–1.24)
GFR, Estimated: >60 mL/min (ref >60)
Glucose, Bld: 151 mg/dL — ABNORMAL HIGH (ref 70–99)
Potassium: 4.2 mmol/L (ref 3.5–5.1)
Sodium: 138 mmol/L (ref 135–145)

## 2022-02-18 LAB — LEGIONELLA PNEUMOPHILA SEROGP 1 UR AG: L. pneumophila Serogp 1 Ur Ag: NEGATIVE

## 2022-02-18 LAB — PROCALCITONIN: Procalcitonin: <0.1 ng/mL

## 2022-02-18 LAB — MAGNESIUM
Magnesium: 1.9 mg/dL (ref 1.7–2.4)
Magnesium: 2 mg/dL (ref 1.7–2.4)

## 2022-02-18 LAB — HEPARIN LEVEL (UNFRACTIONATED): Heparin Unfractionated: 0.65 [IU]/mL (ref 0.30–0.70)

## 2022-02-18 LAB — PHOSPHORUS: Phosphorus: 3.9 mg/dL (ref 2.5–4.6)

## 2022-02-18 SURGERY — LEFT HEART CATH AND CORS/GRAFTS ANGIOGRAPHY
Anesthesia: Moderate Sedation

## 2022-02-18 MED ORDER — ALBUTEROL SULFATE HFA 108 (90 BASE) MCG/ACT IN AERS: 1.0000 | INHALATION_SPRAY | Freq: Four times a day (QID) | RESPIRATORY_TRACT | Status: DC | PRN

## 2022-02-18 MED ORDER — HEPARIN (PORCINE) IN NACL 1000-0.9 UT/500ML-% IV SOLN
INTRAVENOUS | Status: DC | PRN
  Administered 2022-02-18 (×2): 500 mL

## 2022-02-18 MED ORDER — HEPARIN SODIUM (PORCINE) 1000 UNIT/ML IJ SOLN
INTRAMUSCULAR | Status: AC
  Filled 2022-02-18: qty 10

## 2022-02-18 MED ORDER — HEPARIN SODIUM (PORCINE) 1000 UNIT/ML IJ SOLN
INTRAMUSCULAR | Status: DC | PRN
Start: 2022-02-18 — End: 2022-02-18
  Administered 2022-02-18: 4000 [IU] via INTRAVENOUS

## 2022-02-18 MED ORDER — MIDAZOLAM HCL 2 MG/2ML IJ SOLN
INTRAMUSCULAR | Status: AC
  Filled 2022-02-18: qty 2

## 2022-02-18 MED ORDER — VERAPAMIL HCL 2.5 MG/ML IV SOLN
INTRAVENOUS | Status: DC | PRN
  Administered 2022-02-18 (×2): 2.5 mg via INTRA_ARTERIAL

## 2022-02-18 MED ORDER — NICOTINE 21 MG/24HR TD PT24
21.0000 mg | MEDICATED_PATCH | Freq: Every day | TRANSDERMAL | Status: DC
  Filled 2022-02-18: qty 1

## 2022-02-18 MED ORDER — SODIUM CHLORIDE 0.9 % IV SOLN: INTRAVENOUS | Status: DC

## 2022-02-18 MED ORDER — VERAPAMIL HCL 2.5 MG/ML IV SOLN
INTRAVENOUS | Status: AC
  Filled 2022-02-18: qty 2

## 2022-02-18 MED ORDER — FUROSEMIDE 10 MG/ML IJ SOLN
20.0000 mg | Freq: Once | INTRAMUSCULAR | Status: AC
  Administered 2022-02-18: 20 mg via INTRAVENOUS
  Filled 2022-02-18: qty 2

## 2022-02-18 MED ORDER — ASPIRIN 81 MG PO CHEW: 243.0000 mg | CHEWABLE_TABLET | Freq: Once | ORAL | Status: AC

## 2022-02-18 MED ORDER — HEPARIN (PORCINE) IN NACL 1000-0.9 UT/500ML-% IV SOLN
INTRAVENOUS | Status: AC
  Filled 2022-02-18: qty 1000

## 2022-02-18 MED ORDER — HEPARIN (PORCINE) 25000 UT/250ML-% IV SOLN: 850.0000 [IU]/h | INTRAVENOUS | Status: DC

## 2022-02-18 MED ORDER — HEPARIN (PORCINE) 25000 UT/250ML-% IV SOLN
850.0000 [IU]/h | INTRAVENOUS | Status: DC
  Administered 2022-02-18: 850 [IU]/h via INTRAVENOUS
  Filled 2022-02-18: qty 250

## 2022-02-18 MED ORDER — LIDOCAINE HCL (PF) 1 % IJ SOLN
INTRAMUSCULAR | Status: DC | PRN
  Administered 2022-02-18 (×2): 2 mL

## 2022-02-18 MED ORDER — ATORVASTATIN CALCIUM 20 MG PO TABS: 80.0000 mg | ORAL_TABLET | Freq: Every day | ORAL | Status: DC

## 2022-02-18 MED ORDER — ONDANSETRON HCL 4 MG/2ML IJ SOLN: 4.0000 mg | Freq: Four times a day (QID) | INTRAMUSCULAR | Status: DC | PRN

## 2022-02-18 MED ORDER — SODIUM CHLORIDE 0.9% FLUSH
3.0000 mL | Freq: Two times a day (BID) | INTRAVENOUS | Status: DC
  Administered 2022-02-18 (×2): 3 mL via INTRAVENOUS

## 2022-02-18 MED ORDER — SODIUM CHLORIDE 0.9% FLUSH: 3.0000 mL | INTRAVENOUS | Status: DC | PRN

## 2022-02-18 MED ORDER — LIDOCAINE HCL 1 % IJ SOLN
INTRAMUSCULAR | Status: AC
  Filled 2022-02-18: qty 20

## 2022-02-18 MED ORDER — ASPIRIN 81 MG PO CHEW
CHEWABLE_TABLET | ORAL | Status: AC
  Administered 2022-02-18: 243 mg via ORAL
  Filled 2022-02-18: qty 3

## 2022-02-18 MED ORDER — SODIUM CHLORIDE 0.9 % IV SOLN: 250.0000 mL | INTRAVENOUS | Status: DC | PRN

## 2022-02-18 MED ORDER — MIDAZOLAM HCL 2 MG/2ML IJ SOLN
INTRAMUSCULAR | Status: DC | PRN
  Administered 2022-02-18: .5 mg via INTRAVENOUS

## 2022-02-18 MED ORDER — SODIUM CHLORIDE 0.9% FLUSH: 3.0000 mL | Freq: Two times a day (BID) | INTRAVENOUS | Status: DC

## 2022-02-18 MED ORDER — IOHEXOL 300 MG/ML  SOLN
INTRAMUSCULAR | Status: DC | PRN
  Administered 2022-02-18: 143 mL

## 2022-02-18 MED ORDER — FENTANYL CITRATE (PF) 100 MCG/2ML IJ SOLN
INTRAMUSCULAR | Status: DC | PRN
  Administered 2022-02-18: 25 ug via INTRAVENOUS

## 2022-02-18 MED ORDER — FENTANYL CITRATE (PF) 100 MCG/2ML IJ SOLN
INTRAMUSCULAR | Status: AC
Start: 2022-02-18 — End: ?
  Filled 2022-02-18: qty 2

## 2022-02-18 MED ORDER — MIDODRINE HCL 5 MG PO TABS
5.0000 mg | ORAL_TABLET | Freq: Three times a day (TID) | ORAL | Status: DC
  Administered 2022-02-18: 5 mg via ORAL
  Filled 2022-02-18: qty 1

## 2022-02-18 SURGICAL SUPPLY — 11 items
CATH 5FR JL3.5 JR4 ANG PIG MP (CATHETERS) ×1 IMPLANT
DEVICE RAD TR BAND REGULAR (VASCULAR PRODUCTS) ×2 IMPLANT
DRAPE BRACHIAL (DRAPES) ×2 IMPLANT
GLIDESHEATH SLEND SS 6F .021 (SHEATH) ×2 IMPLANT
GUIDEWIRE INQWIRE 1.5J.035X260 (WIRE) IMPLANT
INQWIRE 1.5J .035X260CM (WIRE) ×2
PACK CARDIAC CATH (CUSTOM PROCEDURE TRAY) ×2 IMPLANT
PROTECTION STATION PRESSURIZED (MISCELLANEOUS) ×2
SET ATX SIMPLICITY (MISCELLANEOUS) ×1 IMPLANT
STATION PROTECTION PRESSURIZED (MISCELLANEOUS) IMPLANT
WIRE HITORQ VERSACORE ST 145CM (WIRE) ×1 IMPLANT

## 2022-02-18 NOTE — Consult Note (Signed)
ANTICOAGULATION CONSULT NOTE  Pharmacy Consult for IV Heparin Indication: chest pain/ACS  Patient Measurements: Height: 5\' 6"  (167.6 cm) Weight: 72.4 kg (159 lb 9.8 oz) IBW/kg (Calculated) : 63.8 Heparin Dosing Weight: 71.2 kg  Labs: Recent Labs    02/17/22 0209 02/17/22 0447 02/17/22 0857 02/17/22 1044 02/17/22 1518 02/17/22 1900 02/17/22 2051 02/18/22 0312  HGB 18.7*  --   --   --   --   --   --  14.3  HCT 57.9*  --   --   --   --   --   --  43.9  PLT 220  --   --   --   --   --   --  133*  APTT  --   --   --  35  --   --   --   --   LABPROT  --   --   --  16.9*  --   --   --   --   INR  --   --   --  1.4*  --   --   --   --   HEPARINUNFRC  --   --   --   --   --  0.62  --  0.65  CREATININE 1.02  --   --   --   --   --   --  1.02  TROPONINIHS 40*   < > 946*  --  1,034*  --  796*  --    < > = values in this interval not displayed.     Estimated Creatinine Clearance: 60.8 mL/min (by C-G formula based on SCr of 1.02 mg/dL).   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 On SAPT with Plavix at home  Assessment: Patient is a 71 y/o M with medical history as above and including CAD s/p CABG, PAD, Afib not on anticoagulation, CHF, EtOH use disorder, tobacco use disorder who presented  to the ED 5/22 with respiratory distress. Patient reportedly told his spouse prior to presentation that he felt like he was having a heart attack. Initial concern was for flash pulmonary edema but patient only responded minimally to BiPAP + NTG gtt and ultimately required intubation. Patient extubated same day a short time later. Troponin elevated and there is concern for NSTEMI. Pharmacy consulted for IV heparin.  Baseline INR 1.4, aPTT 35s. Baseline CBC notable for polycythemia.   Goal of Therapy:  Heparin level 0.3-0.7 units/ml Monitor platelets by anticoagulation protocol: Yes   Plan:  --Heparin level is therapeutic x 2 --Continue heparin infusion at 850 units/hr --Heparin level tomorrow AM --Daily CBC  per protocol; platelets down-trending, continue to monitor --Duration of anticoagulation per cardiology  Tressie Ellis 02/18/2022,8:07 AM

## 2022-02-18 NOTE — Progress Notes (Signed)
Unable to obtain BP in either LE (calf or ankles) secondary to extensive "vascular disease" per Dr. Beatrix Fetters. Paged Dr. 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 BP cuff will have to be taken off in between pressure readings. Dr. Beatrix Fetters spoke with family and wife at bedside. Pt. Asymptomatic with LE BP's. A&O x3: in no acute distress.

## 2022-02-18 NOTE — Interval H&P Note (Signed)
History and Physical Interval Note:  02/18/2022 10:07 AM  Dennis Preston  has presented today for surgery, with the diagnosis of elevated troponin, EKG changes.  Briefly, he is a 71 year old male with history of CABG in 2016, severe peripheral vascular disease, severe COPD who is admitted with acute respiratory distress and chest pain.  In the setting his troponin is elevated 2000, with a possible wall motion abnormality anteriorly.  EKG this morning shows deep T wave inversions in his anterior leads concerning for ischemia.  He has stabilized from his acute presentation, so I feel it is important to proceed with angiography.  The risks and benefits were discussed at length with the patient and his wife who are agreeable to proceed.  Given his severe peripheral vascular disease, we will attempt a left radial artery approach.  There are no other ideal access sites following this, though the right femoral artery should be acceptable if needed.  Will discuss case with vascular surgery if right femoral artery access is required.  The various methods of treatment have been discussed with the patient and family. After consideration of risks, benefits and other options for treatment, the patient has consented to  Procedure(s): LEFT HEART CATH AND CORS/GRAFTS ANGIOGRAPHY (N/A) as a surgical intervention.  The patient's history has been reviewed, patient examined, no change in status, stable for surgery.  I have reviewed the patient's chart and labs.  Questions were answered to the patient's satisfaction.     [place]

## 2022-02-18 NOTE — Progress Notes (Signed)
Patient brought back from cath lab..Patients EKG irregular. Dr. Corky Sox into see patient NOW. Right TR band-slight blood drainage. Per MD Continue to release air and monitor. At his time does not want another EKG. Will recheck electrolytes. Family at bedside and updated per MD. We do not currently have a bed. Continue to assess.

## 2022-02-18 NOTE — Discharge Summary (Signed)
Physician Discharge Summary  XAIDYN WENNER [id number] DOB: 11-08-1950 DOA: 02/17/2022  PCP: Tracie Harrier, MD  Admit date: 02/17/2022 Discharge date: 02/18/2022  Admitted From: home  Disposition:  Transfer to [place], accepting physician Dr. Baron Hamper  Recommendations for Outpatient Follow-up:  As per physicians at [facility]: no  Equipment/Devices: 2L [place] chronically   Discharge Condition: stable  CODE STATUS: full  Diet recommendation: Heart Healthy   Brief/Interim Summary: HPI was taken from NP Rust-Chester: 71 yo M presenting to [facility] ED from home via EMS in severe respiratory distress. Per the patient's spouse, bedside, the patient woke her up around 00:45 on 02/17/22 after going to bed around 22:00 in his recliner. He stated that he felt like he was having a heart attack. She checked on him, he was dyspneic and diaphoretic and she called EMS. She does not remember him complaining of chest pain, and believes that when EMS asked he said no. She denies any recent fever/chills, nausea/vomiting/ diarrhea, or chest pain. She does report worsening dyspnea recently (timeframe unclear) and states he has difficulty ambulating more then a few steps before becoming fatigued- however this does not appear to be new. She reports a slightly worse productive cough, not sure of sputum color.  Wife and daughters confirm that he is still smoking cigarettes, about a half a pack a day. He quit briefly about 5 years ago then relapsed. He does not use a CPAP but does wear 2 L [place] 24/7. They deny any regular ETOH or recreational drug use. Wife confirms that he has been taking all medications as prescribed including his lasix. She also reports that he has lost about 6 pounds this month.   ED course: Upon arrival patient awake but altered and in severe respiratory distress on CPAP. Due to concern for flash pulmonary edema, lasix given/ placed on BIPAP and nitroglycerin drip started. Patient had  only slight improvement, continuing to suffer from severe respiratory distress and was emergently intubated requiring mechanical ventilatory support. After intubation, patient became hypotensive and was started on a levophed drip. Medications given: 80 mg Lasix, etomidate, versed 2 mg, succinylcholine, nitro drip and propofol drip started Initial Vitals: 98.8, 17, 107, 122/87 & 99 % on CPAP Significant labs: (Labs/ Imaging personally reviewed) I, Domingo Pulse Rust-Chester, AGACNP-BC, personally viewed and interpreted this ECG. EKG Interpretation: Date: 02/17/22, EKG Time: 02:42, Rate: 100, Rhythm: ST, QRS Axis:  borderline RAD, Intervals: possible bi-atrial enlargement, ST/T Wave abnormalities: non specific T wave abnormalities, Narrative Interpretation: ST with borderline RAD and possible bi-atrial enlargement Chemistry: Na+:143, K+: 4.4, BUN/Cr.: 15/ 1.02, Serum CO2/ AG: 32/11, AST: 58 Hematology: WBC: 136., Hgb: 18.7,  Troponin: 40, BNP: 305.5, PCT: 0.14, lactic: pending COVID-19 & Influenza A/B: negative ABG: 7.26/ 70/ 408/ 31.4 CXR 02/17/22: background emphysema and chronic interstitial coarsening with suspected pulmonary edema. CT wo contrast 02/17/22: No acute intracranial abnormality   PCCM consulted for admission due to acute on chronic hypoxic/hypercarbic respiratory failure requiring mechanical ventilatory support and vasopressors for circulatory shock.  As per Dr. Jimmye Norman 02/18/22: Pt was taken for cardiac cath today by cardio. 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 Pt has been accepted by Dr. Baron Hamper at [facility] for stenting of his left subclavian.   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 Pt has been accepted by Dr. Baron Hamper at [facility] for stenting of his left subclavian. Continue on IV heparin as per cardio    Acute on chronic hypoxic & hypercapnic respiratory failure: likely secondary to acute flash pulmonary edema. W/ hx of COPD & chronic diastolic CHF. Intubated, ventilated & extubated. Continue on supplemental oxygen and wean back to baseline as tolerated. Baseline of 2L [place] chronically. Continue on IV steroids, bronchodilators. Encourage incentive spirometry   Acute on chronic diastolic CHF exacerbation: continue on IV lasix. Monitor I/Os. Holding coreg as BP was low. Cardio following and recs apprec   PAD: continue on plavix, aspirin. Hold statin   HLD: holding statin   Transaminitis: etiology unclear. Hold statin  Discharge Instructions  Discharge Instructions     Diet - low sodium heart healthy   Complete by: As directed    Discharge instructions   Complete by: As directed    As per physicians at [facility]   Increase activity slowly   Complete by: As directed       Allergies as of 02/18/2022       Reactions   Chlorhexidine         Medication List     TAKE these medications    acetaminophen 325 MG tablet Commonly known as: TYLENOL Take 650 mg by mouth every 6 (six) hours as needed for mild pain or headache.   albuterol 108 (90 Base) MCG/ACT inhaler Commonly known as: VENTOLIN HFA Inhale 1-2 puffs into the lungs every 6 (six) hours as needed for wheezing or shortness of breath.   albuterol (2.5 MG/3ML) 0.083% nebulizer solution Commonly known as: PROVENTIL Take 3 mLs (2.5 mg total) by nebulization every 6 (six) hours as needed for wheezing or shortness of breath.   atorvastatin 80 MG tablet Commonly known as: LIPITOR Take 1 tablet (80 mg total) by mouth daily at 6 PM.   carvedilol 3.125 MG tablet Commonly known as: COREG Take 3.125 mg by mouth 2 (two) times daily.   clopidogrel 75 MG tablet Commonly known as:  PLAVIX Take 75 mg by mouth every evening.   furosemide 40 MG tablet Commonly known as: LASIX Take 40 mg by mouth daily.   heparin 25000 UT/250ML infusion Inject 850 Units/hr into the vein continuous.   traMADol 50 MG tablet Commonly known as: ULTRAM Take 1 tablet (50 mg total) by mouth every 6 (six) hours as needed.   Trelegy Ellipta 100-62.5-25 MCG/ACT Aepb Generic drug: Fluticasone-Umeclidin-Vilant Inhale 1 puff into the lungs every other day.   zolpidem 5 MG tablet Commonly known as: AMBIEN Take 5 mg by mouth at bedtime as needed.        Allergies  Allergen Reactions   Chlorhexidine     Consultations: Cardio ICU    Procedures/Studies: DG Chest 1 View  Result Date: 02/17/2022 CLINICAL DATA:  Respiratory failure EXAM: CHEST  1 VIEW COMPARISON:  None Available. FINDINGS: Endotracheal tube roughly 7 cm above the carina. Nasogastric tube extends in the upper abdomen beyond the margin of the examination. The lungs are symmetrically hyperinflated in keeping with changes of underlying COPD. Pulmonary insufflation is stable since prior examination. Superimposed perihilar interstitial pulmonary infiltrate persists likely reflecting mild perihilar pulmonary edema, possibly cardiogenic in nature. No confluent pulmonary infiltrate. No pneumothorax or pleural effusion. Left costophrenic angle is partially excluded from view. Coronary artery bypass grafting has been performed.  Cardiac size within normal limits. IMPRESSION: Support tubes in appropriate position. COPD. Persistent mild perihilar pulmonary edema, possibly cardiogenic in nature. Electronically Signed   By: Helyn NumbersAshesh  Parikh M.D.   On: 02/17/2022 03:35   DG Abdomen 1 View  Result Date: 02/17/2022 CLINICAL DATA:  For gastric tube placement EXAM: ABDOMEN - 1 VIEW COMPARISON:  None Available. FINDINGS: Orogastric tube tip is seen overlying the expected gastric fundus. Nonobstructive bowel gas pattern. No gross free intraperitoneal  gas. IMPRESSION: Orogastric tube tip within the expected gastric fundus. Electronically Signed   By: Helyn NumbersAshesh  Parikh M.D.   On: 02/17/2022 03:40   CT Head Wo Contrast  Result Date: 02/17/2022 CLINICAL DATA:  Respiratory distress, diaphoretic, altered mental status EXAM: CT HEAD WITHOUT CONTRAST TECHNIQUE: Contiguous axial images were obtained from the base of the skull through the vertex without intravenous contrast. RADIATION DOSE REDUCTION: This exam was performed according to the departmental dose-optimization program which includes automated exposure control, adjustment of the mA and/or kV according to patient size and/or use of iterative reconstruction technique. COMPARISON:  04/19/2020 FINDINGS: Brain: No evidence of acute infarction, hemorrhage, cerebral edema, mass, mass effect, or midline shift. No hydrocephalus or extra-axial fluid collection. Periventricular white matter changes, likely the sequela of chronic small vessel ischemic disease. Vascular: No hyperdense vessel. Atherosclerotic calcifications in the intracranial carotid and vertebral arteries. Skull: Normal. Negative for fracture or focal lesion. Sinuses/Orbits: Mucosal thickening in the frontal sinuses and ethmoid air cells. The orbits are unremarkable. Other: The mastoid air cells are well aerated. IMPRESSION: No acute intracranial process. No etiology seen for the patient's altered mental status. Electronically Signed   By: Wiliam KeAlison  Vasan M.D.   On: 02/17/2022 03:53   CARDIAC CATHETERIZATION  Result Date: 02/18/2022   Prox RCA lesion is 70% stenosed.   Mid RCA lesion is 90% stenosed.   RPAV lesion is 100% stenosed.   Dist Graft lesion before RPAV  is 50% stenosed.   Origin to Prox Graft lesion is 100% stenosed.   Mid LM to Dist LM lesion is 90% stenosed.   Ramus lesion is 90% stenosed.   Mid LAD lesion is 70% stenosed.   The left ventricular systolic function is normal.   LV end diastolic pressure is mildly elevated.   The left  ventricular ejection fraction is 50-55% by visual estimate.   There is mild aortic valve stenosis. Conclusion: Severe native three-vessel coronary artery disease similar to previous. Patent SVG to RPDA/RPL; diffuse disease in RPL system. 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  Gradient across left subclavian is at least 50 to 60 mmHg. Overall normal left ventricular systolic function with mild anterior hypokinesis Recommendations: Transfer to tertiary care facility for consideration of angioplasty/stenting to the ostial left circumflex. Continue dual antiplatelet therapy We will resume heparin infusion 6 hours post TR band removal. [facility]   DG Chest Port 1 View  Result Date: 02/17/2022 CLINICAL DATA:  Check central line positioning. EXAM: PORTABLE CHEST 1 VIEW COMPARISON:  Portable chest earlier today at 2:50 a.m. FINDINGS: 4:50 a.m., 02/17/2022. ETT has been advanced with tip now 6.6 cm from the carina. A left IJ central line has been placed and its tip is in the SVC at the azygous confluence. Enteric tube is better visualized on the current than prior study. The side-hole is  at the hiatus and would recommend advancing the tube further into the stomach 8-10 cm. The cardiac size is normal with again noted transverse aortic atherosclerosis, CABG. Central vascular prominence appears similar as well as COPD and coarse chronic changes. There is no overt edema , no focal lung infiltrate is seen. No pleural effusion is evident. Osteopenia and thoracic spondylosis. IMPRESSION: 1. Left IJ line tip is in the SVC at the azygous confluence. There is no pneumothorax. 2. NGT should be advanced further into the stomach 8-10 cm. 3. COPD, chronic lung changes. No appreciable focal infiltrate. 4. Stable mild central  vascular prominence. Electronically Signed   By: Telford Nab M.D.   On: 02/17/2022 05:27   DG Chest Port 1 View  Result Date: 02/17/2022 CLINICAL DATA:  Shortness of breath. EXAM: PORTABLE CHEST 1 VIEW COMPARISON:  Chest radiograph dated 02/24/2021 and CT dated 01/03/2022. FINDINGS: Background of emphysema and chronic interstitial coarsening. No focal consolidation, pleural effusion, pneumothorax. The cardiac silhouette is within normal limits. Mediastinal wires and CABG vascular clips. No acute osseous pathology. IMPRESSION: No acute cardiopulmonary process. Electronically Signed   By: Anner Crete M.D.   On: 02/17/2022 02:54   VAS [place] ABI WITH/WO TBI  Result Date: 02/10/2022  LOWER EXTREMITY DOPPLER STUDY Patient Name:  TAJA GABEL  Date of Exam:   02/04/2022 Medical Rec #: [id number]        Accession #:    [id number] Date of Birth: 08/03/1951       Patient Gender: M Patient Age:   30 years Exam Location:  [facility] Procedure:      VAS [place] ABI WITH/WO TBI Referring Phys: Leotis Pain --------------------------------------------------------------------------------  Indications: Peripheral artery disease.  Comparison Study: 06/18/2021 Performing Technologist: Concha Norway RVT  Examination Guidelines: A complete evaluation includes at minimum, Doppler waveform signals and systolic blood pressure reading at the level of bilateral brachial, anterior tibial, and posterior tibial arteries, when vessel segments are accessible. Bilateral testing is considered an integral part of a complete examination. Photoelectric Plethysmograph (PPG) waveforms and toe systolic pressure readings are included as required and additional duplex testing as needed. Limited examinations for reoccurring indications may be performed as noted.  ABI Findings: +---------+------------------+-----+----------+--------+ Right    Rt Pressure (mmHg)IndexWaveform  Comment   +---------+------------------+-----+----------+--------+ Brachial 122                                       +---------+------------------+-----+----------+--------+ ATA      110               0.89 biphasic           +---------+------------------+-----+----------+--------+ PTA      70                0.56 monophasic         +---------+------------------+-----+----------+--------+ Great Toe90                0.73 Abnormal           +---------+------------------+-----+----------+--------+ +---------+------------------+-----+--------+-------+ Left     Lt Pressure (mmHg)IndexWaveformComment +---------+------------------+-----+--------+-------+ Brachial 124                                    +---------+------------------+-----+--------+-------+ ATA      116  0.94 biphasic        +---------+------------------+-----+--------+-------+ PTA      122               0.98 biphasic        +---------+------------------+-----+--------+-------+ Great Toe118               0.95 Normal          +---------+------------------+-----+--------+-------+ +-------+-----------+-----------+------------+------------+ ABI/TBIToday's ABIToday's TBIPrevious ABIPrevious TBI +-------+-----------+-----------+------------+------------+ Right  .89        .73        .69         .52          +-------+-----------+-----------+------------+------------+ Left   .98        .95        1.18        .85          +-------+-----------+-----------+------------+------------+ Right ABIs appear increased compared to prior study on 05/2021.  Summary: Right: Resting right ankle-brachial index indicates mild right lower extremity arterial disease. The right toe-brachial index is normal. Left: Resting left ankle-brachial index is within normal range. No evidence of significant left lower extremity arterial disease. The left toe-brachial index is normal. *See table(s) above for measurements and  observations.  Electronically signed by Leotis Pain MD on 02/10/2022 at 2:00:08 PM.    Final    ECHOCARDIOGRAM COMPLETE  Result Date: 02/17/2022    ECHOCARDIOGRAM REPORT   Patient Name:   ANQUAN GORELICK Date of Exam: 02/17/2022 Medical Rec #:  [id number]       Height:       66.0 in Accession #:    [id number]      Weight:       157.0 lb Date of Birth:  [birth year]      BSA:          1.804 m Patient Age:    4 years        BP:           90/50 mmHg Patient Gender: M               HR:           61 bpm. Exam Location:  [facility] Procedure: 2D Echo, Cardiac Doppler and Color Doppler Indications:     NSTEMI I21.4  History:         Patient has prior history of Echocardiogram examinations, most                  recent 06/13/2018. Prior CABG, COPD, Arrythmias:Atrial                  Fibrillation; Risk Factors:Hypertension. NSTEMI, tobacco abuse.  Sonographer:     Sherrie Sport Referring Phys:  [id number] Kathrin Ruddy ORGEL Diagnosing Phys: Donnelly Angelica  Sonographer Comments: Technically challenging study due to limited acoustic windows, no parasternal window and suboptimal subcostal window. Image acquisition challenging due to COPD. IMPRESSIONS  1. Poor endocardial definition, so difficult to r/o anteroseptal WMA. Left ventricular ejection fraction, by estimation, is 55 to 60%. Left ventricular ejection fraction by 2D MOD biplane is 56.7 %. The left ventricle has normal function. The left ventricle has no regional wall motion abnormalities. Left ventricular diastolic parameters were normal.  2. Right ventricular systolic function is normal. The right ventricular size is normal.  3. Left atrial size was mildly dilated.  4. The mitral valve is normal in structure. No evidence of mitral valve regurgitation. No evidence of mitral stenosis.  5.  The aortic valve is normal in structure. Aortic valve regurgitation is not visualized. No aortic stenosis is present. FINDINGS  Left Ventricle: Poor endocardial definition, so difficult to r/o  anteroseptal WMA. Left ventricular ejection fraction, by estimation, is 55 to 60%. Left ventricular ejection fraction by 2D MOD biplane is 56.7 %. The left ventricle has normal function. The left ventricle has no regional wall motion abnormalities. The left ventricular internal cavity size was normal in size. Suboptimal image quality limits for assessment of left ventricular hypertrophy. Left ventricular diastolic parameters were normal. Right Ventricle: The right ventricular size is normal. Right vetricular wall thickness was not well visualized. Right ventricular systolic function is normal. Left Atrium: Left atrial size was mildly dilated. Right Atrium: Right atrial size was normal in size. Pericardium: There is no evidence of pericardial effusion. Mitral Valve: The mitral valve is normal in structure. No evidence of mitral valve regurgitation. No evidence of mitral valve stenosis. MV peak gradient, 7.4 mmHg. The mean mitral valve gradient is 2.0 mmHg. Tricuspid Valve: The tricuspid valve is normal in structure. Tricuspid valve regurgitation is trivial. Aortic Valve: The aortic valve is normal in structure. Aortic valve regurgitation is not visualized. No aortic stenosis is present. Aortic valve mean gradient measures 2.5 mmHg. Aortic valve peak gradient measures 4.6 mmHg. Aortic valve area, by VTI measures 3.56 cm. Pulmonic Valve: The pulmonic valve was not assessed. Aorta: The aortic root was not well visualized. IAS/Shunts: No atrial level shunt detected by color flow Doppler.  LEFT VENTRICLE PLAX 2D                        Biplane EF (MOD) LVIDd:         3.40 cm         LV Biplane EF:   Left LVIDs:         2.70 cm                          ventricular LV PW:         1.30 cm                          ejection LV IVS:        1.15 cm                          fraction by LVOT diam:     2.00 cm                          2D MOD LV SV:         79                               biplane is LV SV Index:   44                                56.7 %. LVOT Area:     3.14 cm                                Diastology  LV e' medial:    6.42 cm/s LV Volumes (MOD)               LV E/e' medial:  12.8 LV vol d, MOD    63.4 ml       LV e' lateral:   8.27 cm/s A2C:                           LV E/e' lateral: 10.0 LV vol d, MOD    92.2 ml A4C: LV vol s, MOD    25.3 ml A2C: LV vol s, MOD    42.3 ml A4C: LV SV MOD A2C:   38.1 ml LV SV MOD A4C:   92.2 ml LV SV MOD BP:    43.0 ml RIGHT VENTRICLE RV Basal diam:  3.60 cm RV S prime:     11.00 cm/s TAPSE (M-mode): 1.9 cm LEFT ATRIUM           Index        RIGHT ATRIUM           Index LA diam:      3.90 cm 2.16 cm/m   RA Area:     15.90 cm LA Vol (A2C): 68.4 ml 37.91 ml/m  RA Volume:   40.40 ml  22.39 ml/m LA Vol (A4C): 37.7 ml 20.90 ml/m  AORTIC VALVE AV Area (Vmax):    3.16 cm AV Area (Vmean):   3.52 cm AV Area (VTI):     3.56 cm AV Vmax:           107.50 cm/s AV Vmean:          73.300 cm/s AV VTI:            0.223 m AV Peak Grad:      4.6 mmHg AV Mean Grad:      2.5 mmHg LVOT Vmax:         108.00 cm/s LVOT Vmean:        82.100 cm/s LVOT VTI:          0.253 m LVOT/AV VTI ratio: 1.13 MITRAL VALVE                TRICUSPID VALVE MV Area (PHT): 4.10 cm     TR Peak grad:   13.1 mmHg MV Area VTI:   2.02 cm     TR Vmax:        181.00 cm/s MV Peak grad:  7.4 mmHg MV Mean grad:  2.0 mmHg     SHUNTS MV Vmax:       1.36 m/s     Systemic VTI:  0.25 m MV Vmean:      60.3 cm/s    Systemic Diam: 2.00 cm MV Decel Time: 185 msec MV E velocity: 82.30 cm/s MV A velocity: 135.00 cm/s MV E/A ratio:  0.61 Donnelly Angelica Electronically signed by Donnelly Angelica Signature Date/Time: 02/17/2022/5:10:17 PM    Final    VAS US CAROTID  Result Date: 02/10/2022 Carotid Arterial Duplex Study Patient Name:  JAQUI OFTEDAHL  Date of Exam:   02/04/2022 Medical Rec #: [id number]        Accession #:    [id number] Date of Birth: April 22, 1951       Patient Gender: M Patient Age:   56 years Exam Location:  [facility] Procedure:      VAS US CAROTID Referring Phys: Leotis Pain --------------------------------------------------------------------------------  Indications: Carotid artery disease  and right endarterectomy. Performing Technologist: Concha Norway RVT  Examination Guidelines: A complete evaluation includes B-mode imaging, spectral Doppler, color Doppler, and power Doppler as needed of all accessible portions of each vessel. Bilateral testing is considered an integral part of a complete examination. Limited examinations for reoccurring indications may be performed as noted.  Right Carotid Findings: +----------+--------+--------+--------+------------------+--------+           PSV cm/sEDV cm/sStenosisPlaque DescriptionComments +----------+--------+--------+--------+------------------+--------+ CCA Prox  68      11                                         +----------+--------+--------+--------+------------------+--------+ CCA Mid   123     19                                         +----------+--------+--------+--------+------------------+--------+ CCA Distal69      10                                         +----------+--------+--------+--------+------------------+--------+ ICA Prox  34      8                                          +----------+--------+--------+--------+------------------+--------+ ICA Mid   33      10                                         +----------+--------+--------+--------+------------------+--------+ ICA Distal58      13                                         +----------+--------+--------+--------+------------------+--------+ ECA       164     25                                         +----------+--------+--------+--------+------------------+--------+ +----------+--------+-------+----------------+-------------------+           PSV cm/sEDV cmsDescribe        Arm Pressure (mmHG)  +----------+--------+-------+----------------+-------------------+ Subclavian130     0      Multiphasic, WNL                    +----------+--------+-------+----------------+-------------------+ +---------+--------+--+--------+-+---------+ VertebralPSV cm/s26EDV cm/s7Antegrade +---------+--------+--+--------+-+---------+ Widely patent ICA s/p CEA. Left Carotid Findings: +----------+--------+--------+--------+------------------+--------+           PSV cm/sEDV cm/sStenosisPlaque DescriptionComments +----------+--------+--------+--------+------------------+--------+ CCA Prox  44      14                                         +----------+--------+--------+--------+------------------+--------+ CCA Mid   93      16                                         +----------+--------+--------+--------+------------------+--------+  CCA Distal66      20                                         +----------+--------+--------+--------+------------------+--------+ ICA Prox  52      14                                         +----------+--------+--------+--------+------------------+--------+ ICA Mid   61      13                                         +----------+--------+--------+--------+------------------+--------+ ICA Distal178     41      40-59%  calcific                   +----------+--------+--------+--------+------------------+--------+ ECA       144     26                                         +----------+--------+--------+--------+------------------+--------+ +----------+--------+--------+----------------+-------------------+           PSV cm/sEDV cm/sDescribe        Arm Pressure (mmHG) +----------+--------+--------+----------------+-------------------+ [id number]      0       Multiphasic, WNL                    +----------+--------+--------+----------------+-------------------+ +---------+--------+--+--------+--+---------------+ VertebralPSV  cm/s23EDV cm/s12Bi- directional +---------+--------+--+--------+--+---------------+   Summary: Right Carotid: There is no evidence of stenosis in the right ICA. Left Carotid: Velocities in the left ICA are consistent with a 40-59% stenosis. Vertebrals:  Bilateral vertebral arteries demonstrate antegrade flow. Subclavians: Normal flow hemodynamics were seen in bilateral subclavian              arteries. *See table(s) above for measurements and observations.  Electronically signed by Leotis Pain MD on 02/10/2022 at 2:00:34 PM.    Final    (Echo, Carotid, EGD, Colonoscopy, ERCP)    Subjective: Pt c/o fatigue    Discharge Exam: Vitals:   02/18/22 1235 02/18/22 1240  BP: (!) 30/13 (!) 110/55  Pulse: 78 (!) 53  Resp: (!) 26 18  Temp:    SpO2: 91% 92%   Vitals:   02/18/22 1205 02/18/22 1230 02/18/22 1235 02/18/22 1240  BP:  (!) 44/34 (!) 30/13 (!) 110/55  Pulse: 63 66 78 (!) 53  Resp:  (!) 21 (!) 26 18  Temp:      TempSrc:      SpO2:  90% 91% 92%  Weight:      Height:        General: Pt is alert, awake, not in acute distress Cardiovascular: S1/S2 +, no rubs, no gallops Respiratory: diminished breath sounds b/l  Abdominal: Soft, NT, ND, bowel sounds + Extremities: no cyanosis    The results of significant diagnostics from this hospitalization (including imaging, microbiology, ancillary and laboratory) are listed below for reference.     Microbiology: Recent Results (from the past 240 hour(s))  Resp Panel by RT-PCR (Flu A&B, Covid) Nasopharyngeal Swab     Status: None   Collection Time: 02/17/22  2:09 AM  Specimen: Nasopharyngeal Swab; Nasopharyngeal(NP) swabs in vial transport medium  Result Value Ref Range Status   SARS Coronavirus 2 by RT PCR NEGATIVE NEGATIVE Final    Comment: (NOTE) SARS-CoV-2 target nucleic acids are NOT DETECTED.  The SARS-CoV-2 RNA is generally detectable in upper respiratory specimens during the acute phase of infection. The lowest concentration  of SARS-CoV-2 viral copies this assay can detect is 138 copies/mL. A negative result does not preclude SARS-Cov-2 infection and should not be used as the sole basis for treatment or other patient management decisions. A negative result may occur with  improper specimen collection/handling, submission of specimen other than nasopharyngeal swab, presence of viral mutation(s) within the areas targeted by this assay, and inadequate number of viral copies(<138 copies/mL). A negative result must be combined with clinical observations, patient history, and epidemiological information. The expected result is Negative.  Fact Sheet for Patients:  EntrepreneurPulse.com.au  Fact Sheet for Healthcare Providers:  IncredibleEmployment.be  This test is no t yet approved or cleared by the [place] FDA and  has been authorized for detection and/or diagnosis of SARS-CoV-2 by FDA under an Emergency Use Authorization (EUA). This EUA will remain  in effect (meaning this test can be used) for the duration of the COVID-19 declaration under Section 564(b)(1) of the Act, 21 U.S.C.section 360bbb-3(b)(1), unless the authorization is terminated  or revoked sooner.       Influenza A by PCR NEGATIVE NEGATIVE Final   Influenza B by PCR NEGATIVE NEGATIVE Final    Comment: (NOTE) The Xpert Xpress SARS-CoV-2/FLU/RSV plus assay is intended as an aid in the diagnosis of influenza from Nasopharyngeal swab specimens and should not be used as a sole basis for treatment. Nasal washings and aspirates are unacceptable for Xpert Xpress SARS-CoV-2/FLU/RSV testing.  Fact Sheet for Patients: EntrepreneurPulse.com.au  Fact Sheet for Healthcare Providers: IncredibleEmployment.be  This test is not yet approved or cleared by the [place] FDA and has been authorized for detection and/or diagnosis of SARS-CoV-2 by FDA under an Emergency Use  Authorization (EUA). This EUA will remain in effect (meaning this test can be used) for the duration of the COVID-19 declaration under Section 564(b)(1) of the Act, 21 U.S.C. section 360bbb-3(b)(1), unless the authorization is terminated or revoked.  Performed at [facility], [place], [place] [address]   Respiratory (~20 pathogens) panel by PCR     Status: None   Collection Time: 02/17/22  4:47 AM   Specimen: Nasopharyngeal Swab; Respiratory  Result Value Ref Range Status   Adenovirus NOT DETECTED NOT DETECTED Final   Coronavirus 229E NOT DETECTED NOT DETECTED Final    Comment: (NOTE) The Coronavirus on the Respiratory Panel, DOES NOT test for the novel  Coronavirus (2019 nCoV)    Coronavirus HKU1 NOT DETECTED NOT DETECTED Final   Coronavirus NL63 NOT DETECTED NOT DETECTED Final   Coronavirus OC43 NOT DETECTED NOT DETECTED Final   Metapneumovirus NOT DETECTED NOT DETECTED Final   Rhinovirus / Enterovirus NOT DETECTED NOT DETECTED Final   Influenza A NOT DETECTED NOT DETECTED Final   Influenza B NOT DETECTED NOT DETECTED Final   Parainfluenza Virus 1 NOT DETECTED NOT DETECTED Final   Parainfluenza Virus 2 NOT DETECTED NOT DETECTED Final   Parainfluenza Virus 3 NOT DETECTED NOT DETECTED Final   Parainfluenza Virus 4 NOT DETECTED NOT DETECTED Final   Respiratory Syncytial Virus NOT DETECTED NOT DETECTED Final   Bordetella pertussis NOT DETECTED NOT DETECTED Final   Bordetella Parapertussis NOT DETECTED  NOT DETECTED Final   Chlamydophila pneumoniae NOT DETECTED NOT DETECTED Final   Mycoplasma pneumoniae NOT DETECTED NOT DETECTED Final    Comment: Performed at [facility], [place] [address], [place], [place] [address]  Culture, Respiratory w Gram Stain     Status: None (Preliminary result)   Collection Time: 02/17/22  4:47 AM   Specimen: Tracheal Aspirate; Respiratory  Result Value Ref Range Status   Specimen Description   Final    TRACHEAL  ASPIRATE Performed at [facility], [address] [place], [place] [address]    Special Requests   Final    NONE Performed at [facility], [address] [place], [place] [address]    Gram Stain   Final    NO WBC SEEN FEW GRAM POSITIVE COCCI IN CHAINS RARE GRAM NEGATIVE RODS    Culture   Final    CULTURE REINCUBATED FOR BETTER GROWTH Performed at [facility], [place] [address], [place], [place] [address]    Report Status PENDING  Incomplete  MRSA Next Gen by PCR, Nasal     Status: None   Collection Time: 02/17/22  5:00 AM   Specimen: Nasal Mucosa; Nasal Swab  Result Value Ref Range Status   MRSA by PCR Next Gen NOT DETECTED NOT DETECTED Final    Comment: (NOTE) The GeneXpert MRSA Assay (FDA approved for NASAL specimens only), is one component of a comprehensive MRSA colonization surveillance program. It is not intended to diagnose MRSA infection nor to guide or monitor treatment for MRSA infections. Test performance is not FDA approved in patients less than 88 years old. Performed at [facility], [address] [place], [place] [address]      Labs: BNP (last 3 results) Recent Labs    02/23/21 0723 02/17/22 0204  BNP 59.9 123456*   Basic Metabolic Panel: Recent Labs  Lab 02/17/22 0209 02/17/22 0303 02/18/22 0312  NA 143  --  139  K 4.4  --  3.8  CL 100  --  100  CO2 32  --  33*  GLUCOSE 179*  --  131*  BUN 15  --  21  CREATININE 1.02  --  1.02  CALCIUM 9.1  --  8.7*  MG  --  2.8* 1.9  PHOS  --  5.2* 3.9   Liver Function Tests: Recent Labs  Lab 02/17/22 0209 02/18/22 0312  AST 58* 27  ALT 30 22  ALKPHOS 87 59  BILITOT 0.8 0.7  PROT 7.8 5.4*  ALBUMIN 4.6 3.4*   No results for input(s): LIPASE, AMYLASE in the last 168 hours. No results for input(s): AMMONIA in the last 168 hours. CBC: Recent Labs  Lab 02/17/22 0209 02/18/22 0312  WBC 13.6* 12.3*  NEUTROABS 6.7  --   HGB 18.7* 14.3  HCT 57.9* 43.9  MCV  98.5 95.6  PLT 220 133*   Cardiac Enzymes: No results for input(s): CKTOTAL, CKMB, CKMBINDEX, TROPONINI in the last 168 hours. BNP: Invalid input(s): POCBNP CBG: Recent Labs  Lab 02/17/22 1630 02/17/22 2001 02/18/22 0009 02/18/22 0317 02/18/22 0657  GLUCAP 208* 178* 146* 118* 128*   D-Dimer No results for input(s): DDIMER in the last 72 hours. Hgb A1c Recent Labs    02/17/22 0857  HGBA1C 5.9*   Lipid Profile Recent Labs    02/17/22 0730  CHOL 121  HDL 56  LDLCALC 42  TRIG 113  CHOLHDL 2.2   Thyroid function studies No results for  input(s): TSH, T4TOTAL, T3FREE, THYROIDAB in the last 72 hours.  Invalid input(s): FREET3 Anemia work up No results for input(s): VITAMINB12, FOLATE, FERRITIN, TIBC, IRON, RETICCTPCT in the last 72 hours. Urinalysis    Component Value Date/Time   COLORURINE YELLOW (A) 02/17/2022 0447   APPEARANCEUR HAZY (A) 02/17/2022 0447   LABSPEC 1.009 02/17/2022 0447   PHURINE 7.0 02/17/2022 0447   GLUCOSEU 50 (A) 02/17/2022 0447   HGBUR SMALL (A) 02/17/2022 0447   BILIRUBINUR NEGATIVE 02/17/2022 0447   KETONESUR NEGATIVE 02/17/2022 0447   PROTEINUR 100 (A) 02/17/2022 0447   UROBILINOGEN 0.2 05/10/2008 1654   NITRITE NEGATIVE 02/17/2022 0447   LEUKOCYTESUR NEGATIVE 02/17/2022 0447   Sepsis Labs Invalid input(s): PROCALCITONIN,  WBC,  LACTICIDVEN Microbiology Recent Results (from the past 240 hour(s))  Resp Panel by RT-PCR (Flu A&B, Covid) Nasopharyngeal Swab     Status: None   Collection Time: 02/17/22  2:09 AM   Specimen: Nasopharyngeal Swab; Nasopharyngeal(NP) swabs in vial transport medium  Result Value Ref Range Status   SARS Coronavirus 2 by RT PCR NEGATIVE NEGATIVE Final    Comment: (NOTE) SARS-CoV-2 target nucleic acids are NOT DETECTED.  The SARS-CoV-2 RNA is generally detectable in upper respiratory specimens during the acute phase of infection. The lowest concentration of SARS-CoV-2 viral copies this assay can detect  is 138 copies/mL. A negative result does not preclude SARS-Cov-2 infection and should not be used as the sole basis for treatment or other patient management decisions. A negative result may occur with  improper specimen collection/handling, submission of specimen other than nasopharyngeal swab, presence of viral mutation(s) within the areas targeted by this assay, and inadequate number of viral copies(<138 copies/mL). A negative result must be combined with clinical observations, patient history, and epidemiological information. The expected result is Negative.  Fact Sheet for Patients:  EntrepreneurPulse.com.au  Fact Sheet for Healthcare Providers:  IncredibleEmployment.be  This test is no t yet approved or cleared by the [place] FDA and  has been authorized for detection and/or diagnosis of SARS-CoV-2 by FDA under an Emergency Use Authorization (EUA). This EUA will remain  in effect (meaning this test can be used) for the duration of the COVID-19 declaration under Section 564(b)(1) of the Act, 21 U.S.C.section 360bbb-3(b)(1), unless the authorization is terminated  or revoked sooner.       Influenza A by PCR NEGATIVE NEGATIVE Final   Influenza B by PCR NEGATIVE NEGATIVE Final    Comment: (NOTE) The Xpert Xpress SARS-CoV-2/FLU/RSV plus assay is intended as an aid in the diagnosis of influenza from Nasopharyngeal swab specimens and should not be used as a sole basis for treatment. Nasal washings and aspirates are unacceptable for Xpert Xpress SARS-CoV-2/FLU/RSV testing.  Fact Sheet for Patients: EntrepreneurPulse.com.au  Fact Sheet for Healthcare Providers: IncredibleEmployment.be  This test is not yet approved or cleared by the [place] FDA and has been authorized for detection and/or diagnosis of SARS-CoV-2 by FDA under an Emergency Use Authorization (EUA). This EUA will remain in effect  (meaning this test can be used) for the duration of the COVID-19 declaration under Section 564(b)(1) of the Act, 21 U.S.C. section 360bbb-3(b)(1), unless the authorization is terminated or revoked.  Performed at [facility], [place], [place] [address]   Respiratory (~20 pathogens) panel by PCR     Status: None   Collection Time: 02/17/22  4:47 AM   Specimen: Nasopharyngeal Swab; Respiratory  Result Value Ref Range Status   Adenovirus NOT DETECTED NOT  DETECTED Final   Coronavirus 229E NOT DETECTED NOT DETECTED Final    Comment: (NOTE) The Coronavirus on the Respiratory Panel, DOES NOT test for the novel  Coronavirus (2019 nCoV)    Coronavirus HKU1 NOT DETECTED NOT DETECTED Final   Coronavirus NL63 NOT DETECTED NOT DETECTED Final   Coronavirus OC43 NOT DETECTED NOT DETECTED Final   Metapneumovirus NOT DETECTED NOT DETECTED Final   Rhinovirus / Enterovirus NOT DETECTED NOT DETECTED Final   Influenza A NOT DETECTED NOT DETECTED Final   Influenza B NOT DETECTED NOT DETECTED Final   Parainfluenza Virus 1 NOT DETECTED NOT DETECTED Final   Parainfluenza Virus 2 NOT DETECTED NOT DETECTED Final   Parainfluenza Virus 3 NOT DETECTED NOT DETECTED Final   Parainfluenza Virus 4 NOT DETECTED NOT DETECTED Final   Respiratory Syncytial Virus NOT DETECTED NOT DETECTED Final   Bordetella pertussis NOT DETECTED NOT DETECTED Final   Bordetella Parapertussis NOT DETECTED NOT DETECTED Final   Chlamydophila pneumoniae NOT DETECTED NOT DETECTED Final   Mycoplasma pneumoniae NOT DETECTED NOT DETECTED Final    Comment: Performed at [facility], [place] [address] [place], [place] [address]  Culture, Respiratory w Gram Stain     Status: None (Preliminary result)   Collection Time: 02/17/22  4:47 AM   Specimen: Tracheal Aspirate; Respiratory  Result Value Ref Range Status   Specimen Description   Final    TRACHEAL ASPIRATE Performed at [facility], [address] [place], [place] [address]    Special Requests   Final    NONE Performed at [facility], [address] [place], [place] [address]    Gram Stain   Final    NO WBC SEEN FEW GRAM POSITIVE COCCI IN CHAINS RARE GRAM NEGATIVE RODS    Culture   Final    CULTURE REINCUBATED FOR BETTER GROWTH Performed at [facility], [place] [address], [place], [place] [address]    Report Status PENDING  Incomplete  MRSA Next Gen by PCR, Nasal     Status: None   Collection Time: 02/17/22  5:00 AM   Specimen: Nasal Mucosa; Nasal Swab  Result Value Ref Range Status   MRSA by PCR Next Gen NOT DETECTED NOT DETECTED Final    Comment: (NOTE) The GeneXpert MRSA Assay (FDA approved for NASAL specimens only), is one component of a comprehensive MRSA colonization surveillance program. It is not intended to diagnose MRSA infection nor to guide or monitor treatment for MRSA infections. Test performance is not FDA approved in patients less than 78 years old. Performed at [facility], [address], [place], [place] [address]      Time coordinating discharge: Over 30 minutes  SIGNED:   Wyvonnia Dusky, MD  [facility] 02/18/2022, 12:46 PM Pager   If 7PM-7AM, please contact night-coverage

## 2022-02-18 NOTE — Progress Notes (Signed)
       CROSS COVER NOTE  NAME: ELDOR CONAWAY MRN: [id number] DOB : 1950/12/26  Secure chat received from nursing reporting "SOB, chest burning, and facial swelling" . Mr Marchant had a Left heart catheterization today that showed proximal RCA lesion 70% stenosed, mid RCA lesion is 90% stenosed, RPAV lesion is 100% stenosed, distal graft lesion before RPAV is 50% stenosed, origin to proximal graft lesion is 100% stenosed, mid LM to distal LM lesion is 90% stenosed, ramus lesion is 90% stenosed, mid LAD lesion is 70% stenosed. Patient is scheduled to be transferred to [facility] for evaluation for angioplasty/stenting.  Nursing reached out to Cardiology regarding his symptoms and per nursing cardiology requested the hospitalist team evaluate the patient.  On arrival to bedside Mr. Mcnab reports his symptoms have now resolved.  He reports ongoing chest burning with associated shortness of breath.  He does have an element of shortness of breath at baseline.  He also reports right-sided facial swelling that he and family believes occurred shortly after receiving IV steroids today.  Family also reports that there were some redness on the right side of the face.  On my exam that has resolved.  Patient does have an overall red undertone that is not more pronounced in one area than the other. He has mild expiratory wheezes on exam with trace bibasilar crackles.   Plan: 20 mg IV lasix Continue scheduled duonebs PRN benadryl  Bishop Limbo DNP, MHA, FNP-BC Nurse Practitioner [facility] Pager [phone]

## 2022-02-18 NOTE — Progress Notes (Signed)
Secured message Dr. Beatrix Fetters regarding patients heart rate fluctuating into the lower 40's/irregular. Patient has not had any chest pain throughout the night or currently. Continue to assess.

## 2022-02-18 NOTE — Progress Notes (Signed)
   02/18/22 1500  Clinical Encounter Type  Visited With Patient and family together  Visit Type Follow-up   Chaplain provided support before patient is transported to [facility] for heart procedure.

## 2022-02-18 NOTE — Consult Note (Signed)
ANTICOAGULATION CONSULT NOTE  Pharmacy Consult for IV Heparin Indication: chest pain/ACS  Patient Measurements: Height: 5\' 6"  (167.6 cm) Weight: 72.4 kg (159 lb 9.8 oz) IBW/kg (Calculated) : 63.8 Heparin Dosing Weight: 71.2 kg  Labs: Recent Labs    02/17/22 0209 02/17/22 0447 02/17/22 0857 02/17/22 1044 02/17/22 1518 02/17/22 1900 02/17/22 2051 02/18/22 0312 02/18/22 1415  HGB 18.7*  --   --   --   --   --   --  14.3 14.5  HCT 57.9*  --   --   --   --   --   --  43.9 44.3  PLT 220  --   --   --   --   --   --  133* 138*  APTT  --   --   --  35  --   --   --   --   --   LABPROT  --   --   --  16.9*  --   --   --   --   --   INR  --   --   --  1.4*  --   --   --   --   --   HEPARINUNFRC  --   --   --   --   --  0.62  --  0.65  --   CREATININE 1.02  --   --   --   --   --   --  1.02 0.90  TROPONINIHS 40*   < > 946*  --  1,034*  --  796*  --   --    < > = values in this interval not displayed.     Estimated Creatinine Clearance: 68.9 mL/min (by C-G formula based on SCr of 0.9 mg/dL).   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 On SAPT with Plavix at home  Assessment: Patient is a 71 y/o M with medical history as above and including CAD s/p CABG, PAD, Afib not on anticoagulation,  CHF, EtOH use disorder, tobacco use disorder who presented to the ED 5/22 with respiratory distress. Patient reportedly told his spouse prior to presentation that he felt like he was having a heart attack. Initial concern was for flash pulmonary edema but patient only responded minimally to BiPAP + NTG gtt and ultimately required intubation. Patient extubated same day a short time later. Troponin elevated and there is concern for NSTEMI. Pharmacy consulted for IV heparin.  Baseline INR 1.4, aPTT 35s. Baseline CBC notable for polycythemia.   Goal of Therapy:  Heparin level 0.3-0.7 units/ml Monitor platelets by anticoagulation protocol: Yes  Plan:  Per d/w ICU RN, TR-band fully deflated at 1451 (minimal bleed, now stopped); per consult resume 2-hrs post deflation. Plan to resume at previous therapeutic rate starting 5/23 1700. -- Resume heparin infusion at 850 units/hr -- Heparin level once in 8hrs to confirm then daily if therapeutic. -- Daily CBC per protocol; platelets down-trending, continue to monitor -- Duration of anticoagulation per cardiology  Martyn Malay 02/18/2022,3:44 PM

## 2022-02-19 LAB — CULTURE, RESPIRATORY W GRAM STAIN
Culture: NORMAL
Gram Stain: NONE SEEN

## 2022-02-19 NOTE — Progress Notes (Signed)
Report called to [facility], spoke with Lawanna Kobus, RN. Patient going to room 3213. Family aware of transport.

## 2022-02-19 NOTE — Progress Notes (Signed)
Patient left with [facility] transport team. Report given to RN.

## 2022-02-24 NOTE — Progress Notes (Unsigned)
Patient ID: Dennis Preston, male    DOB: [birth year], 70 y.o.   MRN: [id number]  HPI  Mr Erdos is a 71 y/o male with a history of CAD, hyperlipidemia, HTN, depression, COPD, PAD, atrial fibrillation, previous tobacco/alcohol use and chronic heart failure.   Echo report from 02/17/22 reviewed and showed an EF of 55-60% along with mild LAE. Echo report from 01/11/20 reviewed and showed an EF of 50% along with trivial MR.   LHC done 02/18/22 and showed:   Prox RCA lesion is 70% stenosed.   Mid RCA lesion is 90% stenosed.   RPAV lesion is 100% stenosed.   Dist Graft lesion before RPAV  is 50% stenosed.   Origin to Prox Graft lesion is 100% stenosed.   Mid LM to Dist LM lesion is 90% stenosed.   Ramus lesion is 90% stenosed.   Mid LAD lesion is 70% stenosed.   The left ventricular systolic function is normal.   LV end diastolic pressure is mildly elevated.   The left ventricular ejection fraction is 50-55% by visual estimate.   There is mild aortic valve stenosis.  Conclusion: Severe native three-vessel coronary artery disease similar to previous. Patent SVG to RPDA/RPL; diffuse disease in RPL system. 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  Gradient across left subclavian is at least 50 to 60 mmHg. Overall normal left ventricular systolic function with mild anterior hypokinesis  LHC done 01/23/20 reviewed and showed: LM lesion is 90% stenosed. Ost 1st Diag lesion is 90% stenosed. Ramus lesion is 90% stenosed. Ost Cx lesion is 75% stenosed. Prox RCA lesion is 50% stenosed. Mid RCA lesion is 75% stenosed. Dist RCA-1 lesion is 75% stenosed. Dist RCA-2 lesion is 80% stenosed. RPAV-2 lesion is 80% stenosed. RPAV-1 lesion is 90% stenosed. Mid LM lesion is 90% stenosed. Ost LAD lesion is 90% stenosed. Origin lesion is  100% stenosed. Mid LAD lesion is 100% stenosed.  Conclusion Successful diagnostic cardiac cath Mildly depressed left ventricular function with inferior hypokinesis EF around 45 to 50% Severe multivessel coronary disease including distal left main ostial LAD ostial circumflex Patent grafts LIMA to the LAD SVG to distal RCA SVG to ramus Occluded graft to distal circumflex  Admitted 02/19/22 from outside hospital due to flash pulmonary edema, subclavian stenosis with retrograde flow of LIMA-LAD graft transferred here for angioplasty/stenting to subclavian. At OSH, Placed on bipap and given IV lasix. Subsequently needed to be intubated. Placed on NTG drip due to HTN but then became hypotensive after intubation and placed on levophed. Underwent LHC with PCI of LIMA to LAD. Discharged the next day. Admitted 02/17/22 due to severe respiratory distress. Bipap tried but then needed intubation. Cath done. Transferred to tertiary center 2 days later.    He presents today for a follow-up visit although hasn't been seen since December 2021. He presents with a chief complaint of  minimal fatigue upon moderate exertion. Describes this as chronic in nature. He has associated cough, shortness of breath (improving), weakness and light-headedness along with this. He denies any difficulty sleeping, abdominal distention, palpitations, pedal edema, chest pain, wheezing or weight gain.   Past Medical History:  Diagnosis Date   Alcohol abuse    Amaurosis fugax    Aortic atherosclerosis (HCC)    Atrial fibrillation (HCC)    Carotid stenosis    CHF (congestive  heart failure) (HCC)    Coagulopathy (HCC)    COPD (chronic obstructive pulmonary disease) (HCC)    Coronary artery disease    Depression    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, SVG-OM1, SVG-PDA, SVG-PL   Hyperlipemia    Hypertension    Leucocytosis    Liver dysfunction    Long term current use of clopidogrel    NSTEMI (non-ST elevated myocardial infarction)  ([place])    approx 2016   Peripheral artery disease (HCC)    legs   Tobacco abuse    Wears dentures    full upper and lower   Past Surgical History:  Procedure Laterality Date   APPLICATION OF WOUND VAC Right 06/22/2020   Procedure: APPLICATION OF WOUND VAC;  Surgeon: Algernon Huxley, MD;  Location: [facility];  Service: Vascular;  Laterality: Right;   CARDIAC CATHETERIZATION N/A 07/10/2015   Procedure: Right/Left Heart Cath and Coronary Angiography;  Surgeon: Yolonda Kida, MD;  Location: [facility];  Service: Cardiovascular;  Laterality: N/A;   CORONARY ARTERY BYPASS GRAFT  07/13/2015   [facility].  4 vessel   ENDARTERECTOMY Right 01/02/2021   Procedure: ENDARTERECTOMY CAROTID;  Surgeon: Algernon Huxley, MD;  Location: [facility];  Service: Vascular;  Laterality: Right;   ENDARTERECTOMY FEMORAL Bilateral 05/23/2020   Procedure: ENDARTERECTOMY FEMORAL BILATERAL SUPERFICIAL FEMORAL ARTERY STENTS ;  Surgeon: Katha Cabal, MD;  Location: [facility];  Service: Vascular;  Laterality: Bilateral;   FALSE ANEURYSM REPAIR Left 07/22/2020   Procedure: REPAIR LEFT FEMORAL PSEUDOANUERYSM;  Surgeon: Evaristo Bury, MD;  Location: [facility];  Service: Vascular;  Laterality: Left;   INSERTION OF ILIAC STENT Left 05/23/2020   Procedure: INSERTION OF ILIAC STENT;  Surgeon: Katha Cabal, MD;  Location: [facility];  Service: Vascular;  Laterality: Left;   LEFT HEART CATH AND CORS/GRAFTS ANGIOGRAPHY N/A 01/23/2020   Procedure: LEFT HEART CATH AND CORS/GRAFTS ANGIOGRAPHY;  Surgeon: Yolonda Kida, MD;  Location: [facility];  Service: Cardiovascular;  Laterality: N/A;   LEFT HEART CATH AND CORS/GRAFTS ANGIOGRAPHY N/A 02/18/2022   Procedure: LEFT HEART CATH AND CORS/GRAFTS ANGIOGRAPHY;  Surgeon: Andrez Grime, MD;  Location: [facility];  Service: Cardiovascular;  Laterality: N/A;   LOWER EXTREMITY ANGIOGRAPHY Right 04/03/2020   Procedure: LOWER EXTREMITY ANGIOGRAPHY;  Surgeon: Katha Cabal, MD;  Location: [facility];  Service: Cardiovascular;  Laterality: Right;   OLECRANON BURSECTOMY Left 05/06/2019   Procedure: OLECRANON BURSECTOMY AND DEBRIDEMENT;  Surgeon: Leim Fabry, MD;  Location: [facility];  Service: Orthopedics;  Laterality: Left;   WOUND DEBRIDEMENT Right 06/22/2020   Procedure: DEBRIDEMENT WOUND RIGHT GROIN;  Surgeon: Algernon Huxley, MD;  Location: [facility];  Service: Vascular;  Laterality: Right;   Family History  Problem Relation Age of Onset   Depression Sister    Social History   Tobacco Use   Smoking status: Some Days    Packs/day: 2.50    Years: 54.00    Pack years: 135.00    Types: Cigarettes   Smokeless tobacco: Never   Tobacco comments:    0.5PPD 09/17/21  Substance Use Topics   Alcohol use: Not Currently    Comment: quit over 15 yrs ago   Allergies  Allergen Reactions   Chlorhexidine    Prior to Admission medications   Medication Sig Start Date End Date Taking? Authorizing Provider  albuterol (PROVENTIL) (2.5 MG/3ML) 0.083% nebulizer solution Take 3 mLs (2.5 mg total)  by nebulization every 6 (six) hours as needed for wheezing or shortness of breath. 02/15/20  Yes Jennye Boroughs, MD  albuterol (VENTOLIN HFA) 108 (90 Base) MCG/ACT inhaler Inhale 1-2 puffs into the lungs every 6 (six) hours as needed for wheezing or shortness of breath. 02/15/20  Yes Jennye Boroughs, MD  aspirin 81 MG chewable tablet Chew by mouth. 02/21/22 03/20/23 Yes [provider]  atorvastatin (LIPITOR) 80 MG tablet Take 1 tablet (80 mg total) by mouth daily at 6 PM. 07/10/15  Yes Sainani, Belia Heman, MD  clopidogrel (PLAVIX) 75 MG tablet Take 75 mg by mouth every evening.  04/18/16  Yes [provider]  ELIQUIS 5 MG TABS tablet Take 5 mg by mouth 2 (two) times daily. 02/20/22  Yes [provider]  Fluticasone-Umeclidin-Vilant (TRELEGY ELLIPTA) 100-62.5-25 MCG/INH AEPB Inhale 1 puff into the lungs every other day. 01/27/20  Yes [provider]  furosemide (LASIX) 40 MG tablet Take 40 mg by mouth daily.  12/26/19  Yes [provider]  spironolactone (ALDACTONE) 25 MG tablet Take 12.5 mg by mouth daily. 02/20/22  Yes [provider]  zolpidem (AMBIEN) 5 MG tablet Take 5 mg by mouth at bedtime as needed. 04/10/21  Yes [provider]   Review of Systems  Constitutional:  Positive for fatigue (minimal). Negative for appetite change.  HENT:  Negative for congestion, postnasal drip and sore throat.   Eyes: Negative.   Respiratory:  Positive for cough (productive cough) and shortness of breath (improving). Negative for wheezing.   Cardiovascular:  Negative for chest pain, palpitations and leg swelling.  Gastrointestinal:  Negative for abdominal distention and abdominal pain.  Endocrine: Negative.   Genitourinary: Negative.   Musculoskeletal:  Positive for arthralgias (sometimes left leg). Negative for back pain.  Skin: Negative.   Allergic/Immunologic: Negative.   Neurological:  Positive for weakness and light-headedness (since starting medications). Negative for dizziness.  Hematological:  Negative for adenopathy. Bruises/bleeds easily.  Psychiatric/Behavioral:  Negative for dysphoric mood and sleep disturbance. The patient is not nervous/anxious.    Vitals:   02/25/22 1443  BP: (!) 145/42  Pulse: 74  Resp: 18  SpO2: 94%  Weight: 156 lb 2 oz (70.8 kg)  Height: 5\' 5"  (1.651 m)   Wt Readings from Last 3 Encounters:  02/25/22 156 lb 2 oz (70.8 kg)  02/18/22 159 lb 9.8 oz (72.4 kg)  02/04/22 155 lb (70.3 kg)   Lab Results  Component Value Date   CREATININE 0.90 02/18/2022   CREATININE 1.02 02/18/2022   CREATININE 1.02 02/17/2022    Physical Exam Vitals and nursing note reviewed. Exam conducted with a chaperone present (wife).  Constitutional:      Appearance: Normal appearance.  HENT:     Head: Normocephalic and atraumatic.  Cardiovascular:     Rate and Rhythm: Normal rate and  regular rhythm.  Pulmonary:     Effort: Pulmonary effort is normal.     Breath sounds: No wheezing, rhonchi or rales.  Abdominal:     General: Bowel sounds are normal. There is no distension.     Palpations: Abdomen is soft.  Musculoskeletal:        General: No tenderness.     Cervical back: Normal range of motion and neck supple.     Right lower leg: No edema.     Left lower leg: No edema.  Skin:    General: Skin is warm and dry.  Neurological:     Mental Status: He is  alert and oriented to person, place, and time. Mental status is at baseline.  Psychiatric:        Mood and Affect: Mood normal.        Behavior: Behavior normal.        Thought Content: Thought content normal.   Assessment & Plan:  1: Chronic heart failure with preserved ejection fraction with structural changes- - NYHA class II - euvolemic today - weighing daily; reminded to call for an overnight weight gain of >2 pounds or a weekly weight gain of >5 pounds - not adding salt to his food and has been reading food labels for sodium content - BNP 02/17/22 was 305.5 - Pharm D reconciled medications  2: HTN- - BP mildly elevated (145/42) - saw PCP (Hande) 12/12/21 - BMP 02/20/22 reviewed and showed sodium 141, potassium 3.8, creatinine 1.0 and GFR 81  3: Atrial fibrillation- - saw cardiology Dema Severin) 01/29/22; returns 03/04/22 - currently on plavix & eliquis  4: COPD- - saw pulmonology Raul Del) 12/17/21   Patient did not bring his medications nor a list. Each medication was verbally reviewed with the patient and he was encouraged to bring the bottles to every visit to confirm accuracy of list.  Due to multiple other appointments, will not make a return appointment at this time. Advised him to follow closely with PCP and cardiology and call us for any questions/problems in the future.

## 2022-02-25 ENCOUNTER — Encounter: Payer: Self-pay | Admitting: Family

## 2022-02-25 ENCOUNTER — Ambulatory Visit: Payer: Medicare Other | Attending: Family | Admitting: Family

## 2022-02-25 VITALS — BP 145/42 | HR 74 | Resp 18 | Ht 65.0 in | Wt 156.1 lb

## 2022-02-25 DIAGNOSIS — I739 Peripheral vascular disease, unspecified: Secondary | ICD-10-CM | POA: Insufficient documentation

## 2022-02-25 DIAGNOSIS — F17210 Nicotine dependence, cigarettes, uncomplicated: Secondary | ICD-10-CM | POA: Insufficient documentation

## 2022-02-25 DIAGNOSIS — Z7901 Long term (current) use of anticoagulants: Secondary | ICD-10-CM | POA: Insufficient documentation

## 2022-02-25 DIAGNOSIS — Z955 Presence of coronary angioplasty implant and graft: Secondary | ICD-10-CM | POA: Insufficient documentation

## 2022-02-25 DIAGNOSIS — I110 Hypertensive heart disease with heart failure: Secondary | ICD-10-CM | POA: Insufficient documentation

## 2022-02-25 DIAGNOSIS — J449 Chronic obstructive pulmonary disease, unspecified: Secondary | ICD-10-CM | POA: Insufficient documentation

## 2022-02-25 DIAGNOSIS — I6523 Occlusion and stenosis of bilateral carotid arteries: Secondary | ICD-10-CM | POA: Diagnosis not present

## 2022-02-25 DIAGNOSIS — E785 Hyperlipidemia, unspecified: Secondary | ICD-10-CM | POA: Diagnosis not present

## 2022-02-25 DIAGNOSIS — I480 Paroxysmal atrial fibrillation: Secondary | ICD-10-CM | POA: Diagnosis not present

## 2022-02-25 DIAGNOSIS — I2510 Atherosclerotic heart disease of native coronary artery without angina pectoris: Secondary | ICD-10-CM | POA: Diagnosis not present

## 2022-02-25 DIAGNOSIS — I4891 Unspecified atrial fibrillation: Secondary | ICD-10-CM | POA: Insufficient documentation

## 2022-02-25 DIAGNOSIS — Z7902 Long term (current) use of antithrombotics/antiplatelets: Secondary | ICD-10-CM | POA: Diagnosis not present

## 2022-02-25 DIAGNOSIS — I5032 Chronic diastolic (congestive) heart failure: Secondary | ICD-10-CM | POA: Diagnosis present

## 2022-02-25 DIAGNOSIS — I10 Essential (primary) hypertension: Secondary | ICD-10-CM

## 2022-02-25 NOTE — Patient Instructions (Addendum)
Continue weighing daily and call for an overnight weight gain of 3 pounds or more or a weekly weight gain of more than 5 pounds. ? ? ?If you have voicemail, please make sure your mailbox is cleaned out so that we may leave a message and please make sure to listen to any voicemails.  ? ? ?Call us in the future if you need us for anything ?

## 2022-02-25 NOTE — Progress Notes (Signed)
[place] - PHARMACIST COUNSELING NOTE  Guideline-Directed Medical Therapy/Evidence Based Medicine  ACE/ARB/ARNI: none Beta Blocker: none Aldosterone Antagonist: Spironolactone 25 mg daily Diuretic: none SGLT2i: none  Adherence Assessment  Do you ever forget to take your medication? [] Yes [x] No  Do you ever skip doses due to side effects? [] Yes [x] No  Do you have trouble affording your medicines? [x] Yes [] No  Are you ever unable to pick up your medication due to transportation difficulties? [] Yes [x] No  Do you ever stop taking your medications because you don't believe they are helping? [] Yes [x] No  Do you check your weight daily? [] Yes [] No   Adherence strategy: n/a  Barriers to obtaining medications: n/a  Vital signs: HR 74, BP 145/42, weight (pounds) 156 ECHO: Date 02/17/22, EF 55-60% Cath: Date 02/17/22, EF 50-55%     Latest Ref Rng & Units 02/18/2022    2:15 PM 02/18/2022    3:12 AM 02/17/2022    2:09 AM  BMP  Glucose 70 - 99 mg/dL 151   131   179    BUN 8 - 23 mg/dL 18   21   15     Creatinine 0.61 - 1.24 mg/dL 0.90   1.02   1.02    Sodium 135 - 145 mmol/L 138   139   143    Potassium 3.5 - 5.1 mmol/L 4.2   3.8   4.4    Chloride 98 - 111 mmol/L 101   100   100    CO2 22 - 32 mmol/L 30   33   32    Calcium 8.9 - 10.3 mg/dL 8.5   8.7   9.1      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 PMH relevant to CAD, HTN, HLD, PAD, AF, tobacco use. Concerned about cost of Eliquis  Recent ED Visit (past 6 months): Date - 02/18/22, CC - MI (CABG during admission at [facility])  PLAN Reviewed and reconciled medications Provided Patient Assistance Application for Eliquis. Will follow up on filing PAP application.    Time spent: 20 minutes  Wynelle Cleveland, PharmD Pharmacy Resident  02/25/2022 5:45 PM     Current Outpatient Medications:    albuterol (PROVENTIL) (2.5 MG/3ML) 0.083% nebulizer solution, Take 3 mLs (2.5 mg total) by nebulization every 6 (six) hours as needed for wheezing or shortness of breath., Disp: 360 mL, Rfl: 0   albuterol (VENTOLIN HFA) 108 (90 Base) MCG/ACT inhaler, Inhale 1-2 puffs into the lungs every 6 (six) hours as needed for wheezing or shortness of breath., Disp: 8 g, Rfl: 0  aspirin 81 MG chewable tablet, Chew by mouth., Disp: , Rfl:    atorvastatin (LIPITOR) 80 MG tablet, Take 1 tablet (80 mg total) by mouth daily at 6 PM., Disp: , Rfl:    clopidogrel (PLAVIX) 75 MG tablet, Take 75 mg by mouth every evening. , Disp: , Rfl:    ELIQUIS 5 MG TABS tablet, Take 5 mg by mouth 2 (two) times daily., Disp: , Rfl:    Fluticasone-Umeclidin-Vilant (TRELEGY ELLIPTA) 100-62.5-25 MCG/INH AEPB, Inhale 1 puff into the lungs every other day., Disp: , Rfl:    furosemide (LASIX) 40 MG tablet, Take 40 mg by mouth daily. , Disp: , Rfl:    spironolactone (ALDACTONE) 25 MG tablet, Take 12.5 mg by mouth daily., Disp: , Rfl:    zolpidem (AMBIEN) 5 MG tablet, Take 5 mg by mouth at bedtime as needed., Disp: , Rfl:    COUNSELING POINTS/CLINICAL PEARLS   DRUGS TO CAUTION IN HEART FAILURE  Drug or Class Mechanism  Analgesics NSAIDs COX-2 inhibitors Glucocorticoids  Sodium and water retention, increased systemic  vascular resistance, decreased response to diuretics   Diabetes Medications Metformin Thiazolidinediones Rosiglitazone (Avandia) Pioglitazone (Actos) DPP4 Inhibitors Saxagliptin (Onglyza) Sitagliptin (Januvia)   Lactic acidosis Possible calcium channel blockade   Unknown  Antiarrhythmics Class I  Flecainide Disopyramide Class III Sotalol Other Dronedarone  Negative inotrope, proarrhythmic   Proarrhythmic, beta blockade  Negative inotrope  Antihypertensives Alpha Blockers Doxazosin Calcium Channel Blockers Diltiazem Verapamil Nifedipine Central Alpha Adrenergics Moxonidine Peripheral Vasodilators Minoxidil  Increases renin and aldosterone  Negative inotrope    Possible sympathetic withdrawal  Unknown  Anti-infective Itraconazole Amphotericin B  Negative inotrope Unknown  Hematologic Anagrelide Cilostazol   Possible inhibition of PD IV Inhibition of PD III causing arrhythmias  Neurologic/Psychiatric Stimulants Anti-Seizure Drugs Carbamazepine Pregabalin Antidepressants Tricyclics Citalopram Parkinsons Bromocriptine Pergolide Pramipexole Antipsychotics Clozapine Antimigraine Ergotamine Methysergide Appetite suppressants Bipolar Lithium  Peripheral alpha and beta agonist activity  Negative inotrope and chronotrope Calcium channel blockade  Negative inotrope, proarrhythmic Dose-dependent QT prolongation  Excessive serotonin activity/valvular damage Excessive serotonin activity/valvular damage Unknown  IgE mediated hypersensitivy, calcium channel blockade  Excessive serotonin activity/valvular damage Excessive serotonin activity/valvular damage Valvular damage  Direct myofibrillar degeneration, adrenergic stimulation  Antimalarials Chloroquine Hydroxychloroquine Intracellular inhibition of lysosomal enzymes  Urologic Agents Alpha Blockers Doxazosin Prazosin Tamsulosin Terazosin  Increased renin and aldosterone   Adapted from Page Carleene Overlie, et al. "Drugs That May Cause or Exacerbate Heart Failure: A Scientific Statement from the American Heart  Association." Circulation 2016; 134:e32-e69. DOI: 10.1161/CIR.0000000000000426   MEDICATION ADHERENCES TIPS AND STRATEGIES Taking medication as prescribed improves patient outcomes in heart failure (reduces hospitalizations, improves symptoms, increases survival) Side effects of medications can be managed by decreasing doses, switching agents, stopping drugs, or adding additional therapy. Please let someone in the [facility] know if you have having bothersome side effects so we can modify your regimen. Do not alter your medication regimen without talking to [place].  Medication reminders can help patients remember to take drugs on time. If you are missing or forgetting doses you can try linking behaviors, using pill boxes, or an electronic reminder like an alarm on your phone or an app. Some people can also get automated phone calls as medication reminders.

## 2022-05-04 IMAGING — CT CT ANGIO HEAD
2 of 11 series · 5 of 35 positions shown · IV contrast (omnipaque)
Comparison: None.

CLINICAL DATA: Carotid artery stenosis.

EXAM:
CT ANGIOGRAPHY HEAD AND NECK
TECHNIQUE: Multidetector CT imaging of the head and neck was performed using
the standard protocol during bolus administration of intravenous
contrast. Multiplanar CT image reconstructions and MIPs were
obtained to evaluate the vascular anatomy. Carotid stenosis
measurements (when applicable) are obtained utilizing NASCET
criteria, using the distal internal carotid diameter as the
denominator.
CONTRAST:  75mL OMNIPAQUE IOHEXOL 350 MG/ML SOLN

[Series 16: ax thin mips cta head & neck 1.00 · axial · 0.58mm/px · z∈[-739,-532]mm · 4 of 347 slices shown]
[im 70/347  soft-tissue]
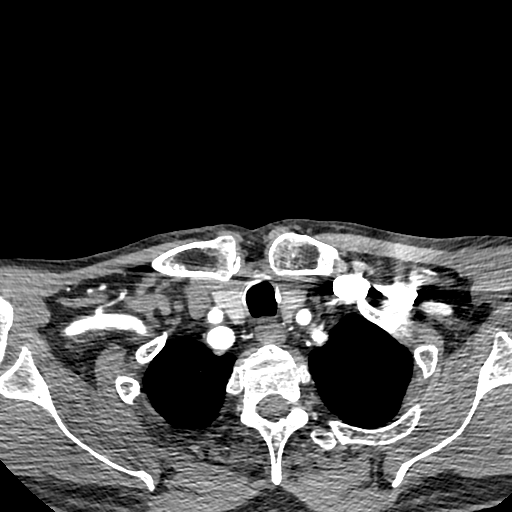
[im 139/347  bone]
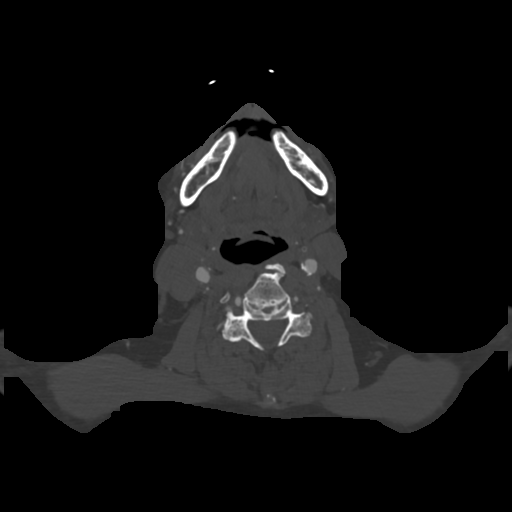
[im 208/347  soft-tissue]
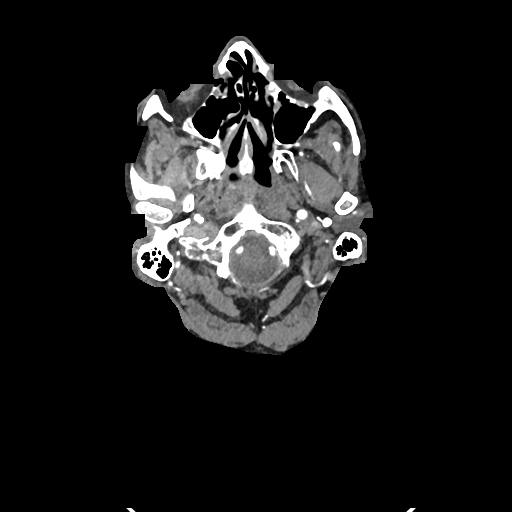
[im 277/347  bone]
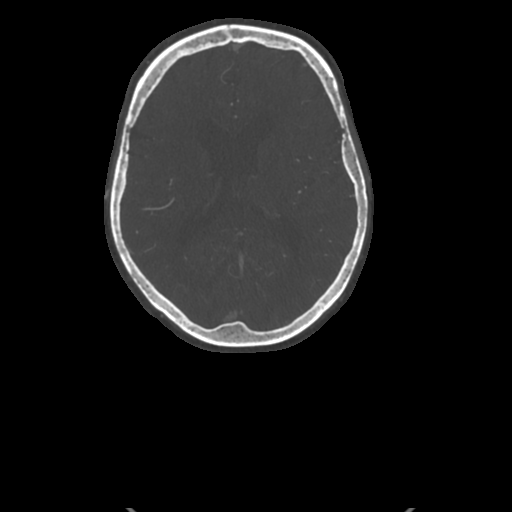

[Series 19: sag thin mips cta head & neck 1.00 sag · sagittal · 0.56mm/px · 1 of 214 slices shown]
[im 66/214  soft-tissue]
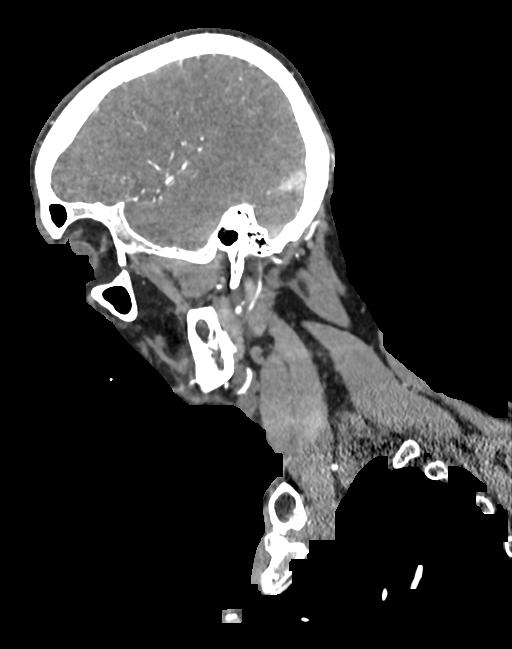

[5 of 35 positions shown; findings below may reference images not displayed]

FINDINGS: CT HEAD FINDINGS

Brain: No evidence of acute infarction, hemorrhage, hydrocephalus,
extra-axial collection or mass lesion/mass effect. Remote lacunar
infarcts are seen in the left thalamus and basal ganglia.

Vascular: Calcified plaques are seen in the left vertebral artery
and bilateral carotid siphons.

Skull: Normal. Negative for fracture or focal lesion.

Sinuses: Imaged portions are clear.

Orbits: No acute finding.

Review of the MIP images confirms the above findings

CTA NECK FINDINGS

Aortic arch: Standard branching. Imaged portion shows no evidence of
aneurysm or dissection. Calcified plaques are seen in the aortic
arch with mixed density plaques at the origin of the major arteries
with 60% stenosis of the left subclavian artery just distal to the
origin. There is also 55% stenosis at the origin of the right
subclavian artery from the innominate artery with poststenotic
dilatation.

Right carotid system: Atherosclerotic changes along the right common
carotid artery with mixed density plaque without hemodynamically
significant stenosis. Prominent atherosclerotic changes in the right
carotid bulb with heavily calcified plaque resulting in
approximately 45% stenosis.

Left carotid system: Atherosclerotic changes are noted along the the
left common carotid artery with mixed density plaque, without
hemodynamically stenosis. Prominent atherosclerotic changes in the
left carotid bulb resulting in approximately 50% stenosis.

Vertebral arteries: Atherosclerotic changes at the origin of the
right vertebral artery resulting in mild stenosis. The right
vertebral artery is dominant. Atherosclerotic changes at the origin
of the left vertebral artery without significant stenosis. There is
moderate stenosis of the left vertebral artery at the V3-V4
junction.

Skeleton: Degenerative changes of the cervical spine. No aggressive
bone lesion identified.

Other neck: None

Upper chest: Centrilobular emphysema. Fibrotic band in the right
upper lobe.

Review of the MIP images confirms the above findings

CTA HEAD FINDINGS

Anterior circulation: Heavily calcified plaques are seen in the
bilateral carotid siphons and petrous segment. Although evaluation
of degree of stenosis is limited by calcification, there seems to be
at least moderate stenosis in the right cavernous segment. No
significant stenosis on the left. The bilateral ACA and MCA vascular
trees a normal course and caliber, noting a hypoplastic right A1/ACA
with dominant left A1 segment.

Posterior circulation: The V4 segment of the bilateral vertebral
arteries and the basilar artery are of small caliber without focal
stenosis. A prominent left and moderate size right posterior
communicating arteries are noted.

Venous sinuses: As permitted by contrast timing, patent.

Anatomic variants: Diminutive vertebrobasilar system, may be
developmental in the presence of bilateral posterior communicating
arteries. Hypoplastic right A1/ACA.

Review of the MIP images confirms the above findings
IMPRESSION: 1. No acute intracranial abnormality. Remote lacunar infarcts in the
left thalamus and basal ganglia.
2. Heavily calcified plaques in the bilateral carotid siphons and
petrous segments. Although evaluation of degree of stenosis is
limited by calcification, there seems to be at least moderate
stenosis in the right cavernous segment. Correlation with catheter
angiogram is suggested in case of suspected symptomatic stenosis.
3. Atherosclerotic changes in the right carotid bulb resulting in
approximately 45% stenosis.
4. Atherosclerotic changes in the left carotid bulb resulting in
approximately 50% stenosis.
5. Atherosclerotic changes at the origins of the major arteries of
the neck with 60% stenosis of the left subclavian artery just distal
to the origin and 55% stenosis at the origin of the right subclavian
artery from the innominate artery.
6. Emphysema and aortic atherosclerosis.

Aortic Atherosclerosis ([id number]) and Emphysema ([id number]).

## 2022-07-03 ENCOUNTER — Encounter (HOSPITAL_COMMUNITY): Payer: Self-pay | Admitting: Emergency Medicine

## 2022-07-03 ENCOUNTER — Inpatient Hospital Stay (HOSPITAL_COMMUNITY): Payer: Medicare Other

## 2022-07-03 ENCOUNTER — Emergency Department (HOSPITAL_COMMUNITY): Payer: Medicare Other

## 2022-07-03 ENCOUNTER — Ambulatory Visit: Admission: RE | Admit: 2022-07-03 | Payer: Medicare Other | Source: Ambulatory Visit

## 2022-07-03 ENCOUNTER — Inpatient Hospital Stay (HOSPITAL_COMMUNITY)
Admission: EM | Admit: 2022-07-03 | Discharge: 2022-07-10 | DRG: 208 | Disposition: A | Discharge status: Home / Self Care | Payer: Medicare Other | Attending: Family Medicine | Admitting: Family Medicine

## 2022-07-03 DIAGNOSIS — F17290 Nicotine dependence, other tobacco product, uncomplicated: Secondary | ICD-10-CM | POA: Diagnosis present

## 2022-07-03 DIAGNOSIS — I252 Old myocardial infarction: Secondary | ICD-10-CM

## 2022-07-03 DIAGNOSIS — I2489 Other forms of acute ischemic heart disease: Secondary | ICD-10-CM | POA: Diagnosis not present

## 2022-07-03 DIAGNOSIS — J181 Lobar pneumonia, unspecified organism: Principal | ICD-10-CM | POA: Diagnosis present

## 2022-07-03 DIAGNOSIS — I471 Supraventricular tachycardia, unspecified: Secondary | ICD-10-CM | POA: Diagnosis present

## 2022-07-03 DIAGNOSIS — I110 Hypertensive heart disease with heart failure: Secondary | ICD-10-CM | POA: Diagnosis not present

## 2022-07-03 DIAGNOSIS — R4182 Altered mental status, unspecified: Secondary | ICD-10-CM | POA: Diagnosis not present

## 2022-07-03 DIAGNOSIS — Z720 Tobacco use: Secondary | ICD-10-CM | POA: Diagnosis not present

## 2022-07-03 DIAGNOSIS — Z7901 Long term (current) use of anticoagulants: Secondary | ICD-10-CM

## 2022-07-03 DIAGNOSIS — I447 Left bundle-branch block, unspecified: Secondary | ICD-10-CM | POA: Diagnosis present

## 2022-07-03 DIAGNOSIS — Z9981 Dependence on supplemental oxygen: Secondary | ICD-10-CM | POA: Diagnosis not present

## 2022-07-03 DIAGNOSIS — E785 Hyperlipidemia, unspecified: Secondary | ICD-10-CM | POA: Diagnosis present

## 2022-07-03 DIAGNOSIS — Z7951 Long term (current) use of inhaled steroids: Secondary | ICD-10-CM

## 2022-07-03 DIAGNOSIS — Z79899 Other long term (current) drug therapy: Secondary | ICD-10-CM

## 2022-07-03 DIAGNOSIS — Z7982 Long term (current) use of aspirin: Secondary | ICD-10-CM

## 2022-07-03 DIAGNOSIS — F17210 Nicotine dependence, cigarettes, uncomplicated: Secondary | ICD-10-CM | POA: Diagnosis present

## 2022-07-03 DIAGNOSIS — I700 Atherosclerosis of aorta: Secondary | ICD-10-CM | POA: Diagnosis present

## 2022-07-03 DIAGNOSIS — J9622 Acute and chronic respiratory failure with hypercapnia: Secondary | ICD-10-CM | POA: Diagnosis not present

## 2022-07-03 DIAGNOSIS — R001 Bradycardia, unspecified: Secondary | ICD-10-CM | POA: Diagnosis not present

## 2022-07-03 DIAGNOSIS — R079 Chest pain, unspecified: Secondary | ICD-10-CM | POA: Diagnosis not present

## 2022-07-03 DIAGNOSIS — R7303 Prediabetes: Secondary | ICD-10-CM | POA: Diagnosis present

## 2022-07-03 DIAGNOSIS — I70203 Unspecified atherosclerosis of native arteries of extremities, bilateral legs: Secondary | ICD-10-CM | POA: Diagnosis present

## 2022-07-03 DIAGNOSIS — I959 Hypotension, unspecified: Secondary | ICD-10-CM | POA: Diagnosis not present

## 2022-07-03 DIAGNOSIS — Z95820 Peripheral vascular angioplasty status with implants and grafts: Secondary | ICD-10-CM | POA: Diagnosis not present

## 2022-07-03 DIAGNOSIS — I472 Ventricular tachycardia, unspecified: Secondary | ICD-10-CM | POA: Diagnosis present

## 2022-07-03 DIAGNOSIS — I25810 Atherosclerosis of coronary artery bypass graft(s) without angina pectoris: Secondary | ICD-10-CM | POA: Diagnosis present

## 2022-07-03 DIAGNOSIS — F1010 Alcohol abuse, uncomplicated: Secondary | ICD-10-CM | POA: Diagnosis present

## 2022-07-03 DIAGNOSIS — I5042 Chronic combined systolic (congestive) and diastolic (congestive) heart failure: Secondary | ICD-10-CM | POA: Diagnosis present

## 2022-07-03 DIAGNOSIS — J449 Chronic obstructive pulmonary disease, unspecified: Secondary | ICD-10-CM | POA: Diagnosis not present

## 2022-07-03 DIAGNOSIS — J9601 Acute respiratory failure with hypoxia: Secondary | ICD-10-CM | POA: Diagnosis not present

## 2022-07-03 DIAGNOSIS — R000 Tachycardia, unspecified: Secondary | ICD-10-CM | POA: Diagnosis present

## 2022-07-03 DIAGNOSIS — R0602 Shortness of breath: Secondary | ICD-10-CM | POA: Diagnosis present

## 2022-07-03 DIAGNOSIS — R9431 Abnormal electrocardiogram [ECG] [EKG]: Secondary | ICD-10-CM

## 2022-07-03 DIAGNOSIS — Z7902 Long term (current) use of antithrombotics/antiplatelets: Secondary | ICD-10-CM

## 2022-07-03 DIAGNOSIS — Z1152 Encounter for screening for COVID-19: Secondary | ICD-10-CM | POA: Diagnosis not present

## 2022-07-03 DIAGNOSIS — I25118 Atherosclerotic heart disease of native coronary artery with other forms of angina pectoris: Secondary | ICD-10-CM | POA: Diagnosis not present

## 2022-07-03 DIAGNOSIS — J440 Chronic obstructive pulmonary disease with acute lower respiratory infection: Secondary | ICD-10-CM | POA: Diagnosis not present

## 2022-07-03 DIAGNOSIS — I480 Paroxysmal atrial fibrillation: Secondary | ICD-10-CM | POA: Diagnosis present

## 2022-07-03 DIAGNOSIS — J9621 Acute and chronic respiratory failure with hypoxia: Secondary | ICD-10-CM | POA: Diagnosis present

## 2022-07-03 DIAGNOSIS — N179 Acute kidney failure, unspecified: Secondary | ICD-10-CM | POA: Diagnosis present

## 2022-07-03 DIAGNOSIS — R918 Other nonspecific abnormal finding of lung field: Secondary | ICD-10-CM | POA: Diagnosis present

## 2022-07-03 DIAGNOSIS — J8489 Other specified interstitial pulmonary diseases: Secondary | ICD-10-CM | POA: Diagnosis present

## 2022-07-03 DIAGNOSIS — I708 Atherosclerosis of other arteries: Secondary | ICD-10-CM | POA: Diagnosis present

## 2022-07-03 DIAGNOSIS — Z818 Family history of other mental and behavioral disorders: Secondary | ICD-10-CM

## 2022-07-03 DIAGNOSIS — I2510 Atherosclerotic heart disease of native coronary artery without angina pectoris: Secondary | ICD-10-CM | POA: Diagnosis present

## 2022-07-03 DIAGNOSIS — Z888 Allergy status to other drugs, medicaments and biological substances status: Secondary | ICD-10-CM

## 2022-07-03 LAB — CBC WITH DIFFERENTIAL/PLATELET
Abs Immature Granulocytes: 0.12 10*3/uL — ABNORMAL HIGH (ref 0.00–0.07)
Basophils Absolute: 0.1 10*3/uL (ref 0.0–0.1)
Basophils Relative: 0 %
Eosinophils Absolute: 0.1 10*3/uL (ref 0.0–0.5)
Eosinophils Relative: 1 %
HCT: 51.4 % (ref 39.0–52.0)
Hemoglobin: 16.1 g/dL (ref 13.0–17.0)
Immature Granulocytes: 1 %
Lymphocytes Relative: 2 %
Lymphs Abs: 0.5 10*3/uL — ABNORMAL LOW (ref 0.7–4.0)
MCH: 30.7 pg (ref 26.0–34.0)
MCHC: 31.3 g/dL (ref 30.0–36.0)
MCV: 98.1 fL (ref 80.0–100.0)
Monocytes Absolute: 1.5 10*3/uL — ABNORMAL HIGH (ref 0.1–1.0)
Monocytes Relative: 8 %
Neutro Abs: 17.3 10*3/uL — ABNORMAL HIGH (ref 1.7–7.7)
Neutrophils Relative %: 88 %
Platelets: 224 10*3/uL (ref 150–400)
RBC: 5.24 MIL/uL (ref 4.22–5.81)
RDW: 14.5 % (ref 11.5–15.5)
WBC: 19.6 10*3/uL — ABNORMAL HIGH (ref 4.0–10.5)
nRBC: 0 % (ref 0.0–0.2)

## 2022-07-03 LAB — I-STAT CHEM 8, ED
BUN: 18 mg/dL (ref 8–23)
Calcium, Ion: 1.07 mmol/L — ABNORMAL LOW (ref 1.15–1.40)
Chloride: 101 mmol/L (ref 98–111)
Creatinine, Ser: 1.2 mg/dL (ref 0.61–1.24)
Glucose, Bld: 208 mg/dL — ABNORMAL HIGH (ref 70–99)
HCT: 58 % — ABNORMAL HIGH (ref 39.0–52.0)
Hemoglobin: 19.7 g/dL — ABNORMAL HIGH (ref 13.0–17.0)
Potassium: 5.1 mmol/L (ref 3.5–5.1)
Sodium: 137 mmol/L (ref 135–145)
TCO2: 32 mmol/L (ref 22–32)

## 2022-07-03 LAB — COMPREHENSIVE METABOLIC PANEL
ALT: 21 U/L (ref 0–44)
AST: 29 U/L (ref 15–41)
Albumin: 4.1 g/dL (ref 3.5–5.0)
Alkaline Phosphatase: 87 U/L (ref 38–126)
Anion gap: 10 (ref 5–15)
BUN: 12 mg/dL (ref 8–23)
CO2: 27 mmol/L (ref 22–32)
Calcium: 8.9 mg/dL (ref 8.9–10.3)
Chloride: 100 mmol/L (ref 98–111)
Creatinine, Ser: 1.33 mg/dL — ABNORMAL HIGH (ref 0.61–1.24)
GFR, Estimated: 58 mL/min — ABNORMAL LOW (ref >60)
Glucose, Bld: 216 mg/dL — ABNORMAL HIGH (ref 70–99)
Potassium: 4.8 mmol/L (ref 3.5–5.1)
Sodium: 137 mmol/L (ref 135–145)
Total Bilirubin: 0.5 mg/dL (ref 0.3–1.2)
Total Protein: 6.6 g/dL (ref 6.5–8.1)

## 2022-07-03 LAB — RESPIRATORY PANEL BY PCR

## 2022-07-03 LAB — GLUCOSE, CAPILLARY
Glucose-Capillary: 137 mg/dL — ABNORMAL HIGH (ref 70–99)
Glucose-Capillary: 122 mg/dL — ABNORMAL HIGH (ref 70–99)
Glucose-Capillary: 155 mg/dL — ABNORMAL HIGH (ref 70–99)
Glucose-Capillary: 167 mg/dL — ABNORMAL HIGH (ref 70–99)
Glucose-Capillary: 175 mg/dL — ABNORMAL HIGH (ref 70–99)

## 2022-07-03 LAB — TROPONIN I (HIGH SENSITIVITY)
Troponin I (High Sensitivity): 53 ng/L — ABNORMAL HIGH (ref <18)
Troponin I (High Sensitivity): 260 ng/L (ref <18)

## 2022-07-03 LAB — I-STAT ARTERIAL BLOOD GAS, ED
Acid-Base Excess: 2 mmol/L (ref 0.0–2.0)
Bicarbonate: 30.5 mmol/L — ABNORMAL HIGH (ref 20.0–28.0)
Calcium, Ion: 1.11 mmol/L — ABNORMAL LOW (ref 1.15–1.40)
HCT: 51 % (ref 39.0–52.0)
Hemoglobin: 17.3 g/dL — ABNORMAL HIGH (ref 13.0–17.0)
O2 Saturation: 100 %
Potassium: 5.5 mmol/L — ABNORMAL HIGH (ref 3.5–5.1)
Sodium: 131 mmol/L — ABNORMAL LOW (ref 135–145)
TCO2: 32 mmol/L (ref 22–32)
pCO2 arterial: 58.8 mmHg — ABNORMAL HIGH (ref 32–48)
pH, Arterial: 7.323 — ABNORMAL LOW (ref 7.35–7.45)
pO2, Arterial: 521 mmHg — ABNORMAL HIGH (ref 83–108)

## 2022-07-03 LAB — URINALYSIS, ROUTINE W REFLEX MICROSCOPIC
Bilirubin Urine: NEGATIVE
Glucose, UA: NEGATIVE mg/dL
Ketones, ur: NEGATIVE mg/dL
Leukocytes,Ua: NEGATIVE
Nitrite: NEGATIVE
Protein, ur: 100 mg/dL — AB
Specific Gravity, Urine: 1.025 (ref 1.005–1.030)
pH: 6 (ref 5.0–8.0)

## 2022-07-03 LAB — CBC
HCT: 57 % — ABNORMAL HIGH (ref 39.0–52.0)
Hemoglobin: 18.3 g/dL — ABNORMAL HIGH (ref 13.0–17.0)
MCH: 32 pg (ref 26.0–34.0)
MCHC: 32.1 g/dL (ref 30.0–36.0)
MCV: 99.7 fL (ref 80.0–100.0)
Platelets: 243 10*3/uL (ref 150–400)
RBC: 5.72 MIL/uL (ref 4.22–5.81)
RDW: 14.5 % (ref 11.5–15.5)
WBC: 18.1 10*3/uL — ABNORMAL HIGH (ref 4.0–10.5)
nRBC: 0 % (ref 0.0–0.2)

## 2022-07-03 LAB — SEDIMENTATION RATE: Sed Rate: 1 mm/hr (ref 0–16)

## 2022-07-03 LAB — MRSA NEXT GEN BY PCR, NASAL: MRSA by PCR Next Gen: NOT DETECTED

## 2022-07-03 LAB — HIV ANTIBODY (ROUTINE TESTING W REFLEX): HIV Screen 4th Generation wRfx: NONREACTIVE

## 2022-07-03 LAB — URINALYSIS, MICROSCOPIC (REFLEX)

## 2022-07-03 LAB — BRAIN NATRIURETIC PEPTIDE: B Natriuretic Peptide: 166.9 pg/mL — ABNORMAL HIGH (ref 0.0–100.0)

## 2022-07-03 LAB — SARS CORONAVIRUS 2 BY RT PCR: SARS Coronavirus 2 by RT PCR: NEGATIVE

## 2022-07-03 LAB — APTT: aPTT: 80 seconds — ABNORMAL HIGH (ref 24–36)

## 2022-07-03 LAB — PROCALCITONIN: Procalcitonin: 0.15 ng/mL

## 2022-07-03 LAB — HEPARIN LEVEL (UNFRACTIONATED): Heparin Unfractionated: >1.1 IU/mL — ABNORMAL HIGH (ref 0.30–0.70)

## 2022-07-03 LAB — LACTIC ACID, PLASMA: Lactic Acid, Venous: 2 mmol/L (ref 0.5–1.9)

## 2022-07-03 MED ORDER — CLOPIDOGREL BISULFATE 75 MG PO TABS
75.0000 mg | ORAL_TABLET | Freq: Every evening | ORAL | Status: DC
  Administered 2022-07-03: 75 mg
  Filled 2022-07-03: qty 1

## 2022-07-03 MED ORDER — ROCURONIUM BROMIDE 50 MG/5ML IV SOLN
100.0000 mg | Freq: Once | INTRAVENOUS | Status: AC
  Filled 2022-07-03: qty 10

## 2022-07-03 MED ORDER — ROCURONIUM BROMIDE 10 MG/ML (PF) SYRINGE
PREFILLED_SYRINGE | INTRAVENOUS | Status: AC
  Administered 2022-07-03: 100 mg
  Filled 2022-07-03: qty 10

## 2022-07-03 MED ORDER — SODIUM CHLORIDE 0.9 % IV SOLN
500.0000 mg | INTRAVENOUS | Status: AC
  Administered 2022-07-03 – 2022-07-05 (×3): 500 mg via INTRAVENOUS
  Filled 2022-07-03 (×3): qty 5

## 2022-07-03 MED ORDER — AMIODARONE HCL IN DEXTROSE 360-4.14 MG/200ML-% IV SOLN
INTRAVENOUS | Status: AC
  Administered 2022-07-03: 13 mg/h
  Filled 2022-07-03: qty 200

## 2022-07-03 MED ORDER — ACETAMINOPHEN 325 MG PO TABS: 650.0000 mg | ORAL_TABLET | ORAL | Status: DC | PRN

## 2022-07-03 MED ORDER — NICOTINE 21 MG/24HR TD PT24
21.0000 mg | MEDICATED_PATCH | Freq: Every day | TRANSDERMAL | Status: DC
  Administered 2022-07-03 – 2022-07-10 (×8): 21 mg via TRANSDERMAL
  Filled 2022-07-03 (×8): qty 1

## 2022-07-03 MED ORDER — DOCUSATE SODIUM 50 MG/5ML PO LIQD: 100.0000 mg | Freq: Two times a day (BID) | ORAL | Status: DC | PRN

## 2022-07-03 MED ORDER — SODIUM CHLORIDE 0.9 % IV SOLN
2.0000 g | INTRAVENOUS | Status: DC
  Administered 2022-07-03 – 2022-07-08 (×6): 2 g via INTRAVENOUS
  Filled 2022-07-03 (×6): qty 20

## 2022-07-03 MED ORDER — ETOMIDATE 2 MG/ML IV SOLN: 20.0000 mg | Freq: Once | INTRAVENOUS | Status: AC

## 2022-07-03 MED ORDER — SODIUM CHLORIDE 0.9 % IV SOLN: INTRAVENOUS | Status: DC | PRN

## 2022-07-03 MED ORDER — DOCUSATE SODIUM 100 MG PO CAPS: 100.0000 mg | ORAL_CAPSULE | Freq: Two times a day (BID) | ORAL | Status: DC | PRN

## 2022-07-03 MED ORDER — ARFORMOTEROL TARTRATE 15 MCG/2ML IN NEBU
15.0000 ug | INHALATION_SOLUTION | Freq: Two times a day (BID) | RESPIRATORY_TRACT | Status: DC
  Administered 2022-07-03 – 2022-07-10 (×15): 15 ug via RESPIRATORY_TRACT
  Filled 2022-07-03 (×16): qty 2

## 2022-07-03 MED ORDER — PROPOFOL 1000 MG/100ML IV EMUL
INTRAVENOUS | Status: AC
  Administered 2022-07-03: 5 ug/kg/min via INTRAVENOUS
  Filled 2022-07-03: qty 100

## 2022-07-03 MED ORDER — ETOMIDATE 2 MG/ML IV SOLN
INTRAVENOUS | Status: AC | PRN
  Administered 2022-07-03: 10 mg via INTRAVENOUS

## 2022-07-03 MED ORDER — ETOMIDATE 2 MG/ML IV SOLN
INTRAVENOUS | Status: AC
  Administered 2022-07-03: 20 mg
  Filled 2022-07-03: qty 10

## 2022-07-03 MED ORDER — FUROSEMIDE 10 MG/ML IJ SOLN
40.0000 mg | Freq: Once | INTRAMUSCULAR | Status: AC
  Administered 2022-07-03: 40 mg via INTRAVENOUS
  Filled 2022-07-03: qty 4

## 2022-07-03 MED ORDER — ATORVASTATIN CALCIUM 40 MG PO TABS
80.0000 mg | ORAL_TABLET | Freq: Every day | ORAL | Status: DC
  Administered 2022-07-03: 80 mg
  Filled 2022-07-03: qty 2

## 2022-07-03 MED ORDER — ASPIRIN 81 MG PO CHEW: 81.0000 mg | CHEWABLE_TABLET | Freq: Every day | ORAL | Status: DC

## 2022-07-03 MED ORDER — METHYLPREDNISOLONE SODIUM SUCC 40 MG IJ SOLR
40.0000 mg | Freq: Every day | INTRAMUSCULAR | Status: DC
  Administered 2022-07-03 – 2022-07-05 (×3): 40 mg via INTRAVENOUS
  Filled 2022-07-03 (×3): qty 1

## 2022-07-03 MED ORDER — ROCURONIUM BROMIDE 50 MG/5ML IV SOLN
INTRAVENOUS | Status: AC | PRN
  Administered 2022-07-03: 100 mg via INTRAVENOUS

## 2022-07-03 MED ORDER — IOHEXOL 350 MG/ML SOLN
75.0000 mL | Freq: Once | INTRAVENOUS | Status: AC | PRN
  Administered 2022-07-03: 75 mL via INTRAVENOUS

## 2022-07-03 MED ORDER — HEPARIN SODIUM (PORCINE) 5000 UNIT/ML IJ SOLN: 5000.0000 [IU] | Freq: Three times a day (TID) | INTRAMUSCULAR | Status: DC

## 2022-07-03 MED ORDER — ORAL CARE MOUTH RINSE: 15.0000 mL | OROMUCOSAL | Status: DC | PRN

## 2022-07-03 MED ORDER — BUDESONIDE 0.5 MG/2ML IN SUSP
0.5000 mg | Freq: Two times a day (BID) | RESPIRATORY_TRACT | Status: DC
  Administered 2022-07-03 – 2022-07-10 (×15): 0.5 mg via RESPIRATORY_TRACT
  Filled 2022-07-03 (×15): qty 2

## 2022-07-03 MED ORDER — CALCIUM GLUCONATE-NACL 1-0.675 GM/50ML-% IV SOLN
1.0000 g | Freq: Once | INTRAVENOUS | Status: AC
  Administered 2022-07-03: 1000 mg via INTRAVENOUS
  Filled 2022-07-03: qty 50

## 2022-07-03 MED ORDER — POLYETHYLENE GLYCOL 3350 17 G PO PACK: 17.0000 g | PACK | Freq: Every day | ORAL | Status: DC | PRN

## 2022-07-03 MED ORDER — MIDAZOLAM HCL 2 MG/2ML IJ SOLN
INTRAMUSCULAR | Status: AC
  Filled 2022-07-03: qty 4

## 2022-07-03 MED ORDER — FENTANYL CITRATE PF 50 MCG/ML IJ SOSY
25.0000 ug | PREFILLED_SYRINGE | INTRAMUSCULAR | Status: DC | PRN
  Administered 2022-07-03 (×7): 100 ug via INTRAVENOUS
  Administered 2022-07-03: 50 ug via INTRAVENOUS
  Filled 2022-07-03 (×3): qty 2
  Filled 2022-07-03: qty 1
  Filled 2022-07-03 (×5): qty 2

## 2022-07-03 MED ORDER — PROSOURCE TF20 ENFIT COMPATIBL EN LIQD
60.0000 mL | Freq: Every day | ENTERAL | Status: DC
  Administered 2022-07-03: 60 mL
  Filled 2022-07-03: qty 60

## 2022-07-03 MED ORDER — HEPARIN (PORCINE) 25000 UT/250ML-% IV SOLN
1000.0000 [IU]/h | INTRAVENOUS | Status: AC
  Administered 2022-07-03: 1100 [IU]/h via INTRAVENOUS
  Administered 2022-07-04: 1000 [IU]/h via INTRAVENOUS
  Filled 2022-07-03 (×2): qty 250

## 2022-07-03 MED ORDER — AMIODARONE IV BOLUS ONLY 150 MG/100ML
INTRAVENOUS | Status: AC
  Administered 2022-07-03: 150 mg
  Filled 2022-07-03: qty 100

## 2022-07-03 MED ORDER — ORAL CARE MOUTH RINSE
15.0000 mL | OROMUCOSAL | Status: DC
  Administered 2022-07-03 – 2022-07-04 (×14): 15 mL via OROMUCOSAL

## 2022-07-03 MED ORDER — VITAL HIGH PROTEIN PO LIQD
1000.0000 mL | ORAL | Status: DC
  Administered 2022-07-03: 1000 mL

## 2022-07-03 MED ORDER — ONDANSETRON HCL 4 MG/2ML IJ SOLN
4.0000 mg | Freq: Four times a day (QID) | INTRAMUSCULAR | Status: DC | PRN
  Administered 2022-07-06: 4 mg via INTRAVENOUS
  Filled 2022-07-03: qty 2

## 2022-07-03 MED ORDER — FENTANYL CITRATE PF 50 MCG/ML IJ SOSY: 25.0000 ug | PREFILLED_SYRINGE | INTRAMUSCULAR | Status: DC | PRN

## 2022-07-03 MED ORDER — PROPOFOL 1000 MG/100ML IV EMUL
5.0000 ug/kg/min | INTRAVENOUS | Status: DC
  Administered 2022-07-03 (×2): 20 ug/kg/min via INTRAVENOUS
  Administered 2022-07-04: 50 ug/kg/min via INTRAVENOUS
  Filled 2022-07-03 (×3): qty 100

## 2022-07-03 MED ORDER — NITROGLYCERIN IN D5W 200-5 MCG/ML-% IV SOLN
INTRAVENOUS | Status: AC
  Filled 2022-07-03: qty 250

## 2022-07-03 MED ORDER — REVEFENACIN 175 MCG/3ML IN SOLN
175.0000 ug | Freq: Every day | RESPIRATORY_TRACT | Status: DC
  Administered 2022-07-03 – 2022-07-10 (×8): 175 ug via RESPIRATORY_TRACT
  Filled 2022-07-03 (×9): qty 3

## 2022-07-03 MED ORDER — INSULIN ASPART 100 UNIT/ML IJ SOLN
0.0000 [IU] | INTRAMUSCULAR | Status: DC
  Administered 2022-07-03 (×2): 2 [IU] via SUBCUTANEOUS
  Administered 2022-07-03: 1 [IU] via SUBCUTANEOUS
  Administered 2022-07-03: 2 [IU] via SUBCUTANEOUS
  Administered 2022-07-04: 1 [IU] via SUBCUTANEOUS
  Administered 2022-07-04 (×2): 2 [IU] via SUBCUTANEOUS
  Administered 2022-07-04 (×2): 1 [IU] via SUBCUTANEOUS
  Administered 2022-07-04: 3 [IU] via SUBCUTANEOUS
  Administered 2022-07-05 (×2): 1 [IU] via SUBCUTANEOUS
  Administered 2022-07-06: 2 [IU] via SUBCUTANEOUS
  Administered 2022-07-06: 3 [IU] via SUBCUTANEOUS
  Administered 2022-07-06: 1 [IU] via SUBCUTANEOUS
  Administered 2022-07-07: 2 [IU] via SUBCUTANEOUS
  Administered 2022-07-07 (×3): 1 [IU] via SUBCUTANEOUS
  Administered 2022-07-08: 3 [IU] via SUBCUTANEOUS
  Administered 2022-07-08: 1 [IU] via SUBCUTANEOUS
  Administered 2022-07-08 – 2022-07-09 (×4): 2 [IU] via SUBCUTANEOUS
  Administered 2022-07-09: 1 [IU] via SUBCUTANEOUS
  Administered 2022-07-10: 2 [IU] via SUBCUTANEOUS

## 2022-07-03 MED ORDER — PANTOPRAZOLE 2 MG/ML SUSPENSION
40.0000 mg | Freq: Every day | ORAL | Status: DC
  Administered 2022-07-03: 40 mg
  Filled 2022-07-03: qty 20

## 2022-07-03 MED ORDER — PANTOPRAZOLE 2 MG/ML SUSPENSION
40.0000 mg | Freq: Every day | ORAL | Status: DC
  Filled 2022-07-03: qty 20

## 2022-07-03 MED ORDER — NITROGLYCERIN IN D5W 200-5 MCG/ML-% IV SOLN: 0.0000 ug/min | INTRAVENOUS | Status: DC

## 2022-07-03 NOTE — ED Notes (Signed)
Per MD Bero hold on amio at this time

## 2022-07-03 NOTE — ED Notes (Signed)
Propofol started at 11mcg per [facility]

## 2022-07-03 NOTE — ED Provider Notes (Signed)
[facility] Emergency Department Provider Note MRN:  [id number]  Arrival date & time: 07/03/22     Chief Complaint   Respiratory distress History of Present Illness   Dennis Preston is a 71 y.o. year-old male with a history of A-fib, CHF, COPD, CABG presenting to the ED with chief complaint of respiratory distress.  EMS called out for respiratory distress and severe chest pain.  Code STEMI called by EMS but canceled by cardiology.  Patient began to lose his mental status in route, bag-valve-mask being utilized on arrival.  Review of Systems  I was unable to obtain a full/accurate HPI, PMH, or ROS due to the patient's altered mental status.  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, SVG-OM1, SVG-PDA, SVG-PL   Hyperlipemia    Hypertension    Leucocytosis    Liver dysfunction    Long term current use of clopidogrel    NSTEMI (non-ST elevated myocardial infarction) ([place])    approx 2016   Peripheral artery disease (HCC)    legs   Tobacco abuse    Wears dentures    full upper and lower    Past Surgical History:  Procedure Laterality Date   APPLICATION OF WOUND VAC Right 06/22/2020   Procedure: APPLICATION OF WOUND VAC;  Surgeon: Algernon Huxley, MD;  Location: [facility];  Service: Vascular;  Laterality: Right;   CARDIAC CATHETERIZATION N/A 07/10/2015   Procedure: Right/Left Heart Cath and Coronary Angiography;  Surgeon: Yolonda Kida, MD;  Location: [place] [facility];  Service: Cardiovascular;  Laterality: N/A;   CORONARY ARTERY BYPASS GRAFT  07/13/2015   [facility].  4 vessel   ENDARTERECTOMY Right 01/02/2021    Procedure: ENDARTERECTOMY CAROTID;  Surgeon: Algernon Huxley, MD;  Location: [facility];  Service: Vascular;  Laterality: Right;   ENDARTERECTOMY FEMORAL Bilateral 05/23/2020   Procedure: ENDARTERECTOMY FEMORAL BILATERAL SUPERFICIAL FEMORAL ARTERY STENTS ;  Surgeon: Katha Cabal, MD;  Location: [facility];  Service: Vascular;  Laterality: Bilateral;   FALSE ANEURYSM REPAIR Left 07/22/2020   Procedure: REPAIR LEFT FEMORAL PSEUDOANUERYSM;  Surgeon: Evaristo Bury, MD;  Location: [facility];  Service: Vascular;  Laterality: Left;   INSERTION OF ILIAC STENT Left 05/23/2020   Procedure: INSERTION OF ILIAC STENT;  Surgeon: Katha Cabal, MD;  Location: [facility];  Service: Vascular;  Laterality: Left;   LEFT HEART CATH AND CORS/GRAFTS ANGIOGRAPHY N/A 01/23/2020   Procedure: LEFT HEART CATH AND CORS/GRAFTS ANGIOGRAPHY;  Surgeon: Yolonda Kida, MD;  Location: [place] [facility];  Service: Cardiovascular;  Laterality: N/A;   LEFT HEART CATH AND CORS/GRAFTS ANGIOGRAPHY N/A 02/18/2022   Procedure: LEFT HEART CATH AND CORS/GRAFTS ANGIOGRAPHY;  Surgeon: Andrez Grime, MD;  Location: [place] [facility];  Service: Cardiovascular;  Laterality: N/A;   LOWER EXTREMITY ANGIOGRAPHY Right 04/03/2020   Procedure: LOWER EXTREMITY ANGIOGRAPHY;  Surgeon: Katha Cabal, MD;  Location: [place] [facility];  Service: Cardiovascular;  Laterality: Right;   OLECRANON BURSECTOMY Left 05/06/2019   Procedure: OLECRANON BURSECTOMY AND DEBRIDEMENT;  Surgeon: Leim Fabry, MD;  Location: [facility];  Service: Orthopedics;  Laterality: Left;   WOUND DEBRIDEMENT Right 06/22/2020   Procedure: DEBRIDEMENT WOUND RIGHT GROIN;  Surgeon: Algernon Huxley, MD;  Location: [facility];  Service: Vascular;  Laterality: Right;    Family History  Problem Relation Age of Onset   Depression Sister     Social History   Socioeconomic History   Marital status: Married    Spouse name: Not on file   Number of children: Not on file   Years  of education: Not on file   Highest education level: Not on file  Occupational History   Not on file  Tobacco Use   Smoking status: Some Days    Packs/day: 2.50    Years: 54.00    Total pack years: 135.00    Types: Cigarettes   Smokeless tobacco: Never   Tobacco comments:    0.5PPD 09/17/21  Vaping Use   Vaping Use: Never used  Substance and Sexual Activity   Alcohol use: Not Currently    Comment: quit over 15 yrs ago   Drug use: Never   Sexual activity: Not on file  Other Topics Concern   Not on file  Social History Narrative   Not on file   Social Determinants of Health   Financial Resource Strain: Not on file  Food Insecurity: Not on file  Transportation Needs: Not on file  Physical Activity: Not on file  Stress: Not on file  Social Connections: Not on file  Intimate Partner Violence: Not on file     Physical Exam   Vitals:   07/03/22 0705 07/03/22 0710  BP: (!) 113/90 109/84  Pulse: (!) 118 (!) 118  Resp: 14 14  SpO2: 100% 99%    CONSTITUTIONAL: Ill-appearing, cold and clammy NEURO/PSYCH: GCS 4 EYES:  eyes equal and reactive ENT/NECK:  no LAD, no JVD CARDIO: Tachycardic rate, poorly perfused PULM: Diminished breath sounds GI/GU: Moderately distended MSK/SPINE:  No gross deformities, no edema SKIN:  no rash, atraumatic   *Additional and/or pertinent findings included in MDM below  Diagnostic and Interventional Summary    EKG Interpretation  Date/Time:  Thursday July 03 2022 06:42:07 EDT Ventricular Rate:  126 PR Interval:    QRS Duration: 144 QT Interval:  406 QTC Calculation: 588 R Axis:   86 Text Interpretation: Junctional tachycardia Left bundle branch block Confirmed by Gerlene Fee [phone]) on 07/03/2022 7:09:21 AM       Labs Reviewed  CBC - Abnormal; Notable for the following components:      Result Value   WBC 18.1 (*)    Hemoglobin 18.3 (*)    HCT 57.0 (*)    All other components within normal limits  I-STAT CHEM 8, ED -  Abnormal; Notable for the following components:   Glucose, Bld 208 (*)    Calcium, Ion 1.07 (*)    Hemoglobin 19.7 (*)    HCT 58.0 (*)    All other components within normal limits  SARS CORONAVIRUS 2 BY RT PCR  RESPIRATORY PANEL BY PCR  COMPREHENSIVE METABOLIC PANEL  LACTIC ACID, PLASMA  BRAIN NATRIURETIC PEPTIDE  PROCALCITONIN  TROPONIN I (HIGH SENSITIVITY)    CT HEAD WO CONTRAST (5MM)  Final Result    DG Chest Portable 1 View  Final Result    CT Angio Chest/Abd/Pel for Dissection W and/or Wo Contrast    (Results Pending)    Medications  propofol (DIPRIVAN) 1000 MG/100ML infusion (5 mcg/kg/min  77.1 kg Intravenous New Bag/Given 07/03/22  0713)  nitroGLYCERIN 50 mg in dextrose 5 % 250 mL (0.2 mg/mL) infusion (has no administration in time range)  nitroGLYCERIN 0.2 mg/mL in dextrose 5 % infusion (has no administration in time range)  amiodarone (NEXTERONE) 150-4.21 MG/100ML-% bolus (has no administration in time range)  amiodarone (NEXTERONE PREMIX) 360-4.14 MG/200ML-% (1.8 mg/mL) IV infusion (has no administration in time range)  cefTRIAXone (ROCEPHIN) 2 g in sodium chloride 0.9 % 100 mL IVPB (has no administration in time range)  azithromycin (ZITHROMAX) 500 mg in sodium chloride 0.9 % 250 mL IVPB (has no administration in time range)  etomidate (AMIDATE) injection (10 mg Intravenous Given 07/03/22 0614)  rocuronium (ZEMURON) injection (100 mg Intravenous Given 07/03/22 0614)  iohexol (OMNIPAQUE) 350 MG/ML injection 75 mL (75 mLs Intravenous Contrast Given 07/03/22 [id number])     Procedures  /  Critical Care .Critical Care  Performed by: Maudie Flakes, MD Authorized by: Maudie Flakes, MD   Critical care provider statement:    Critical care time (minutes):  60   Critical care was necessary to treat or prevent imminent or life-threatening deterioration of the following conditions:  Respiratory failure   Critical care was time spent personally by me on the following activities:   Development of treatment plan with patient or surrogate, discussions with consultants, evaluation of patient's response to treatment, examination of patient, ordering and review of laboratory studies, ordering and review of radiographic studies, ordering and performing treatments and interventions, pulse oximetry, re-evaluation of patient's condition and review of old charts Procedure Name: Intubation Date/Time: 07/03/2022 7:09 AM  Performed by: Maudie Flakes, MDPre-anesthesia Checklist: Patient identified, Patient being monitored, Emergency Drugs available, Timeout performed and Suction available Oxygen Delivery Method: Non-rebreather mask Preoxygenation: Pre-oxygenation with 100% oxygen Induction Type: Rapid sequence Ventilation: Mask ventilation without difficulty Laryngoscope Size: Glidescope and 4 Grade View: Grade I Tube size: 7.5 mm Number of attempts: 1 Airway Equipment and Method: Rigid stylet Placement Confirmation: ETT inserted through vocal cords under direct vision, CO2 detector and Breath sounds checked- equal and bilateral Secured at: 25 cm Comments: RSI using 10 mg etomidate, 100 mg rocuronium      ED Course and Medical Decision Making  Initial Impression and Ddx Very ill-appearing on arrival with severely diminished GCS, report of severe respiratory distress with hypoxia, report of severe chest pain followed by loss of mental status.  Differential diagnosis includes ACS, CHF exacerbation, aortic dissection, COPD exacerbation, pulmonary embolism.  Patient without adequate respiratory effort and given the GCS decision was made to intubate.  Patient was preoxygenated briefly with BiPAP.  See procedural details above.  Patient arrived severely hypertensive and so plan was to begin nitroglycerin drip with the favored diagnosis of CHF exacerbation.  However postintubation, patient exhibiting soft blood pressures with systolic in the 0000000.  Lingering concern for aortic dissection  given the significant change in mental status preceded by chest pain.  Rapidly brought to CT for CTA.  Rhythm change noted well on the CT table, heart rate increased to 150-160.  Morphology concerning for ventricular tachycardia.  Still with a strong pulse.  Given the patient remained intubated, sedated, cardioversion was attempted x2.  No significant rhythm change.  Images quickly obtained and patient brought back to his resuscitation bay.  Plan was then to start amiodarone and call cardiology.  Patient's rhythm again changed with heart rate improving to the 120s, morphology now seems most consistent with either a sinus rhythm or junctional rhythm with a bundle branch block, does not  seem consistent with ventricular tachycardia.     Past medical/surgical history that increases complexity of ED encounter: CABG  Interpretation of Diagnostics I personally reviewed the EKG and my interpretation is as follows: Bundle branch block, sinus or junctional rhythm  Labs pending, awaiting formal CT read by radiology  Patient Reassessment and Ultimate Disposition/Management     To be admitted to ICU  Patient management required discussion with the following services or consulting groups:  Intensivist Service and Cardiology  Complexity of Problems Addressed Acute illness or injury that poses threat of life of bodily function  Additional Data Reviewed and Analyzed Further history obtained from: Care Everywhere and Prior labs/imaging results  Additional Factors Impacting ED Encounter Risk Consideration of hospitalization  Barth Kirks. Sedonia Small, [place] [email or web address]  Final Clinical Impressions(s) / ED Diagnoses     ICD-10-CM   1. Acute respiratory failure with hypoxia (HCC)  J96.01     2. Chest pain, unspecified type  R07.9     3. Altered mental status, unspecified altered mental status type  R41.82       ED Discharge Orders     None         Discharge Instructions Discussed with and Provided to Patient:   Discharge Instructions   None      Maudie Flakes, MD 07/03/22 [phone]

## 2022-07-03 NOTE — Progress Notes (Signed)
ANTICOAGULATION CONSULT NOTE - Initial Consult  Pharmacy Consult for heparin Indication: atrial fibrillation  Allergies  Allergen Reactions   Chlorhexidine     Patient Measurements: Height: 5\' 7"  (170.2 cm) Weight: 77.1 kg (169 lb 15.6 oz) IBW/kg (Calculated) : 66.1 Heparin Dosing Weight: TBW  Vital Signs: Temp: 98.5 F (36.9 C) (10/05 1515) Temp Source: Axillary (10/05 1515) BP: 98/53 (10/05 1900) Pulse Rate: 59 (10/05 1900)  Labs: Recent Labs    07/03/22 0612 07/03/22 0621 07/03/22 0827 07/03/22 0937 07/03/22 1830  HGB 16.1  18.3* 19.7* 17.3*  --   --   HCT 51.4  57.0* 58.0* 51.0  --   --   PLT 224  243  --   --   --   --   APTT  --   --   --   --  80*  HEPARINUNFRC  --   --   --   --  >1.10*  CREATININE 1.33* 1.20  --   --   --   TROPONINIHS 53*  --   --  260*  --      Estimated Creatinine Clearance: 53.6 mL/min (by C-G formula based on SCr of 1.2 mg/dL).   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 He developed VT requiring defibrillation while in CT with subsequent intubation. Due to possible bronchoscopy, pharmacy has been consulted for heparin dosing for  continued treatment for Afib.    Last dose of Eliquis unknown. H/H WNL.  HL >1.1, PTT 80. Not correlating yet. Cont current rate and check confirm level in AM.   Goal of Therapy:  Heparin level 0.3-0.7 units/ml aPTT 66-102 seconds Monitor platelets by anticoagulation protocol: Yes  Plan:  Cont heparin infusion at 1100 units/hr Check anti-Xa and aPTT level in AM and daily while on heparin. Monitor H/H, PLTs, and s/sx of bleeding   Onnie Boer, PharmD, BCIDP, AAHIVP, CPP Infectious Disease Pharmacist 07/03/2022 7:18 PM

## 2022-07-03 NOTE — Procedures (Signed)
Bronchoscopy Procedure Note  Dennis Preston  [id number]  [birth year]  Date:07/03/22  Time:11:31 AM   Provider Performing:Maryama Kuriakose   Procedure(s):  Flexible bronchoscopy with bronchial alveolar lavage (16384)  Indication(s) Acute respiratory failure  Consent Risks of the procedure as well as the alternatives and risks of each were explained to the patient and/or caregiver.  Consent for the procedure was obtained and is signed in the bedside chart  Anesthesia Etomidate and Rocuronium   Time Out Verified patient identification, verified procedure, site/side was marked, verified correct patient position, special equipment/implants available, medications/allergies/relevant history reviewed, required imaging and test results available.   Sterile Technique Usual hand hygiene, masks, gowns, and gloves were used   Procedure Description Bronchoscope advanced through endotracheal tube and into airway.  Airways were examined down to subsegmental level with findings noted below.   Following diagnostic evaluation, BAL(s) performed in LLL with normal saline and return of Yellowish fluid  Findings: Copious amounts of thin secretions noted throughout respiratory tree, moderate amounts of thick secretions noted in LLL, BAL was performed.   Complications/Tolerance None; patient tolerated the procedure well. Chest X-ray is not needed post procedure.   EBL Minimal   Specimen(s) BAL

## 2022-07-03 NOTE — Progress Notes (Signed)
Interventional Cardiology Note:  Patient is a 71 year old male with history of coronary artery disease status post CABG with recent cardiac catheterization demonstrated patent LIMA to LAD, vein graft to ramus, and vein graft to PDA with an occluded vein graft to obtuse marginal who developed acute respiratory distress and altered mental status.  Review of the EKG that was transmitted demonstrates sinus rhythm with left ventricular hypertrophy with repolarization abnormality.  Discussed with ER staff will defer emergency coronary angiography at this point in time.  -Lenna Sciara

## 2022-07-03 NOTE — ED Notes (Signed)
Dr Sedonia Small with family at bedside

## 2022-07-03 NOTE — H&P (Addendum)
NAME:  Dennis Preston, MRN:  [id number], DOB:  Nov 22, 1950, LOS: 0 ADMISSION DATE:  07/03/2022, CONSULTATION DATE:  07/03/22 REFERRING MD:  Mertha Finders, CHIEF COMPLAINT:  hypoxia   History of Present Illness:  Dennis Preston is a 71 y/o gentleman who presented 10/5 with acute respiratory distress and SOB.   His wife reports he has been more short of breath over the last 3 days.  He has been vaping since April of this year.  He has been using his oxygen intermittently in the last few days and has had decreased activity tolerance.  With EMS he had saturations 72% on RA at home. Code STEMI was activated by EMS and he was given aspirin, NTG, duoneb, solumedrol. He had 2 runs of pulseless VT with EMS.  Upon arrival the ED he was evaluated by cardiology, who felt this was more likely repolarization abnormality, less likely a STEMI. He had a recent LHC with open grafts from his CABG other than occlusion of vein graft to obtuse marginal.  Due to worsening mental status and hypoxia he was intubated. CT scan to evaluate for aortic dissection was negative for dissection but demonstrated bilateral airspace disease with multilobar pneumonia and findings consistent with interstitial pneumonitis. During CT scan he developed VT again requiring defibrillation. PCCM consulted for management.  Pertinent  Medical History  CAD s/p CABG HFrEF Tobacco abuse HTN HLD Afib ETOH abuse  Significant Hospital Events: Including procedures, antibiotic start and stop dates in addition to other pertinent events   10/5 Admitted, intubated, developed VT requiring defibrillation  Interim History / Subjective:  As above  Started on Propofol, Ceftriaxone, Azithromycin  Objective   Blood pressure (!) 73/55, pulse 78, temperature (!) 96 F (35.6 C), resp. rate 20, height _0  (1.702 m), weight 77.1 kg, SpO2 99 %.    Vent Mode: PRVC FiO2 (%):  [40 %-100 %] 40 % Set Rate:  [16 bmp-20 bmp] 20 bmp Vt Set:  [530 mL] 530 mL PEEP:  [5  cmH20] 5 cmH20 Delta P (Amplitude):  [25] 25 Plateau Pressure:  [30 MYT11-73 cmH20] 30 cmH20  No intake or output data in the 24 hours ending 07/03/22 0949 Filed Weights   07/03/22 0647  Weight: 77.1 kg    Examination: General: chronically ill appearing male, lying in bed on vent in NAD / intubated and sedated HENT: MM pink/dry, ? Myoclonic tongue movements, dentures in place, pupils 60m reactive Lungs: non-labored, mild air trapping on vent, coarse rhonchi bilaterally  Cardiovascular: S1S2 RRR, ST on monitor  Abdomen: Soft, bsx4 hypoactive, femoral / right neck surgical scarring noted  Extremities: no edema, thin skin with scattered bruises, dry  Neuro: sedated on propofol, positive cough, withdrew to pain stimuli  Resolved Hospital Problem list     Assessment & Plan:   Acute on Chronic Hypoxic / Hypercarbic Respiratory Failure LUL Pulmonary Infiltrates / Multilobar Pneumonia Mild Acute Respiratory Acidosis  Hx of COPD Tobacco/Vape Use Differential dx includes AECOPD, interstitial pneumonitis / inflammatory process, ILD, bacterial PNA / aspiration.  Ongoing tobacco use via vaping.  -LTVV with PRVC, avoid air trapping  -wean PEEP / FiO2 for sats 88-92%  -brovana, pulmicort, yupelri  -hold home trelegy  -continue azithromycin, ceftriaxone empirically  -assess COVID, influenza, RVP  -assess ESR, BNP, PCT, HSP panel  -nicotine patch  -consented wife for bronchoscopy > assess for culture, eosinophils, lipid laden macrophage  Ventricular Tachycardia s/p Defibrillation x2 -tele monitoring  -amiodarone infusion  -appreciate Cardiology evaluation  -no indication for  LHC at this time   HFrEF CAD s/p CABG HTN -continue ASA, plavix -hold home eliquis, heparin infusion while in ICU  -follow I/O's  -hold home lasix 10/5, re-evaluate 10/6   Pre-Diabetes A1c 5.9 -SSI, sensitive scale  -Goal BG 140-180  At Risk Malnutrition -TF per Nutrition   Paroxsymal Atrial  Fibrillation -heparin infusion as above   HLD -continue home lipitor   Alcohol Abuse -unclear timing / if still using. Will need to confirm with family on arrival to ICU -monitor for evidence of withdrawal  Best Practice (right click and "Reselect all SmartList Selections" daily)  Diet/type: tubefeeds and NPO w/ meds via tube DVT prophylaxis: SCD GI prophylaxis: PPI Lines: N/A Foley:  N/A Code Status:  full code Last date of multidisciplinary goals of care discussion:  10/5 Spoke with family in the ED, updated on plan of care.  Confirmed full code status.   Labs   CBC: Recent Labs  Lab 07/03/22 0612 07/03/22 0621 07/03/22 0827  WBC 18.1*  --   --   HGB 18.3* 19.7* 17.3*  HCT 57.0* 58.0* 51.0  MCV 99.7  --   --   PLT 243  --   --     Basic Metabolic Panel: Recent Labs  Lab 07/03/22 0612 07/03/22 0621 07/03/22 0827  NA 137 137 131*  K 4.8 5.1 5.5*  CL 100 101  --   CO2 27  --   --   GLUCOSE 216* 208*  --   BUN 12 18  --   CREATININE 1.33* 1.20  --   CALCIUM 8.9  --   --    GFR: Estimated Creatinine Clearance: 53.6 mL/min (by C-G formula based on SCr of 1.2 mg/dL). Recent Labs  Lab 07/03/22 0612 07/03/22 0714  WBC 18.1*  --   LATICACIDVEN  --  2.0*    Liver Function Tests: Recent Labs  Lab 07/03/22 0612  AST 29  ALT 21  ALKPHOS 87  BILITOT 0.5  PROT 6.6  ALBUMIN 4.1   No results for input(s): "LIPASE", "AMYLASE" in the last 168 hours. No results for input(s): "AMMONIA" in the last 168 hours.  ABG    Component Value Date/Time   PHART 7.323 (L) 07/03/2022 0827   PCO2ART 58.8 (H) 07/03/2022 0827   PO2ART 521 (H) 07/03/2022 0827   HCO3 30.5 (H) 07/03/2022 0827   TCO2 32 07/03/2022 0827   O2SAT 100 07/03/2022 0827     Coagulation Profile: No results for input(s): "INR", "PROTIME" in the last 168 hours.  Cardiac Enzymes: No results for input(s): "CKTOTAL", "CKMB", "CKMBINDEX", "TROPONINI" in the last 168 hours.  HbA1C: Hgb A1c MFr Bld   Date/Time Value Ref Range Status  02/17/2022 08:57 AM 5.9 (H) 4.8 - 5.6 % Final    Comment:    (NOTE) Pre diabetes:          5.7%-6.4%  Diabetes:              >6.4%  Glycemic control for   <7.0% adults with diabetes   08/27/2006 12:00 AM 5.8 %     CBG: No results for input(s): "GLUCAP" in the last 168 hours.  Review of Systems: Positives in [place]   Gen: Denies fever, chills, weight change, fatigue, night sweats, generalized weakness / decreased activity tolerance HEENT: Denies blurred vision, double vision, hearing loss, tinnitus, sinus congestion, rhinorrhea, sore throat, neck stiffness, dysphagia PULM: Denies shortness of breath, cough, sputum production, hemoptysis, wheezing CV: Denies chest pain, edema, orthopnea, paroxysmal  nocturnal dyspnea, palpitations GI: Denies abdominal pain, nausea, vomiting, diarrhea, hematochezia, melena, constipation, change in bowel habits GU: Denies dysuria, hematuria, polyuria, oliguria, urethral discharge Endocrine: Denies hot or cold intolerance, polyuria, polyphagia or appetite change Derm: Denies rash, dry skin, scaling or peeling skin change Heme: Denies easy bruising, bleeding, bleeding gums Neuro: Denies headache, numbness, weakness, slurred speech, loss of memory or consciousness  Past Medical History:  He,  has a past medical history of Alcohol abuse, Amaurosis fugax, Aortic atherosclerosis ([place]), Atrial fibrillation ([place]), Carotid stenosis, CHF (congestive heart failure) ([place]), Coagulopathy ([place]), COPD (chronic obstructive pulmonary disease) ([place]), Coronary artery disease, Depression, CABG (07/13/2015), Hyperlipemia, Hypertension, Leucocytosis, Liver dysfunction, Long term current use of clopidogrel, NSTEMI (non-ST elevated myocardial infarction) ([place]), Peripheral artery disease ([place]), Tobacco abuse, and Wears dentures.   Surgical History:   Past Surgical History:  Procedure Laterality Date   APPLICATION OF WOUND VAC Right 06/22/2020    Procedure: APPLICATION OF WOUND VAC;  Surgeon: Algernon Huxley, MD;  Location: [facility];  Service: Vascular;  Laterality: Right;   CARDIAC CATHETERIZATION N/A 07/10/2015   Procedure: Right/Left Heart Cath and Coronary Angiography;  Surgeon: Yolonda Kida, MD;  Location: [place] [facility];  Service: Cardiovascular;  Laterality: N/A;   CORONARY ARTERY BYPASS GRAFT  07/13/2015   [facility].  4 vessel   ENDARTERECTOMY Right 01/02/2021   Procedure: ENDARTERECTOMY CAROTID;  Surgeon: Algernon Huxley, MD;  Location: [facility];  Service: Vascular;  Laterality: Right;   ENDARTERECTOMY FEMORAL Bilateral 05/23/2020   Procedure: ENDARTERECTOMY FEMORAL BILATERAL SUPERFICIAL FEMORAL ARTERY STENTS ;  Surgeon: Katha Cabal, MD;  Location: [facility];  Service: Vascular;  Laterality: Bilateral;   FALSE ANEURYSM REPAIR Left 07/22/2020   Procedure: REPAIR LEFT FEMORAL PSEUDOANUERYSM;  Surgeon: Evaristo Bury, MD;  Location: [facility];  Service: Vascular;  Laterality: Left;   INSERTION OF ILIAC STENT Left 05/23/2020   Procedure: INSERTION OF ILIAC STENT;  Surgeon: Katha Cabal, MD;  Location: [facility];  Service: Vascular;  Laterality: Left;   LEFT HEART CATH AND CORS/GRAFTS ANGIOGRAPHY N/A 01/23/2020   Procedure: LEFT HEART CATH AND CORS/GRAFTS ANGIOGRAPHY;  Surgeon: Yolonda Kida, MD;  Location: [facility];  Service: Cardiovascular;  Laterality: N/A;   LEFT HEART CATH AND CORS/GRAFTS ANGIOGRAPHY N/A 02/18/2022   Procedure: LEFT HEART CATH AND CORS/GRAFTS ANGIOGRAPHY;  Surgeon: Andrez Grime, MD;  Location: [place] [facility];  Service: Cardiovascular;  Laterality: N/A;   LOWER EXTREMITY ANGIOGRAPHY Right 04/03/2020   Procedure: LOWER EXTREMITY ANGIOGRAPHY;  Surgeon: Katha Cabal, MD;  Location: [place] [facility];  Service: Cardiovascular;  Laterality: Right;   OLECRANON BURSECTOMY Left 05/06/2019   Procedure: OLECRANON BURSECTOMY AND DEBRIDEMENT;  Surgeon: Leim Fabry, MD;  Location: [facility];  Service: Orthopedics;  Laterality: Left;   WOUND DEBRIDEMENT Right 06/22/2020   Procedure: DEBRIDEMENT WOUND RIGHT GROIN;  Surgeon: Algernon Huxley, MD;  Location: [facility];  Service: Vascular;  Laterality: Right;     Social History:   reports that he has been smoking cigarettes. He has a 135.00 pack-year smoking history. He has never used smokeless tobacco. He reports that he does not currently use alcohol. He reports that he does not use drugs.   Family History:  His family history includes Depression in his sister.   Allergies Allergies  Allergen Reactions   Chlorhexidine      Home Medications  Prior to Admission medications   Medication Sig Start Date End Date Taking? Authorizing  Provider  albuterol (PROVENTIL) (2.5 MG/3ML) 0.083% nebulizer solution Take 3 mLs (2.5 mg total) by nebulization every 6 (six) hours as needed for wheezing or shortness of breath. 02/15/20   Jennye Boroughs, MD  albuterol (VENTOLIN HFA) 108 (90 Base) MCG/ACT inhaler Inhale 1-2 puffs into the lungs every 6 (six) hours as needed for wheezing or shortness of breath. 02/15/20   Jennye Boroughs, MD  aspirin 81 MG chewable tablet Chew by mouth. 02/21/22 03/20/23  [provider]  atorvastatin (LIPITOR) 80 MG tablet Take 1 tablet (80 mg total) by mouth daily at 6 PM. 07/10/15   Henreitta Leber, MD  clopidogrel (PLAVIX) 75 MG tablet Take 75 mg by mouth every evening.  04/18/16   [provider]  ELIQUIS 5 MG TABS tablet Take 5 mg by mouth 2 (two) times daily. 02/20/22   [provider]  Fluticasone-Umeclidin-Vilant (TRELEGY ELLIPTA) 100-62.5-25 MCG/INH AEPB Inhale 1 puff into the lungs every other day. 01/27/20   [provider]  furosemide (LASIX) 40 MG tablet Take 40 mg by mouth daily.  12/26/19   [provider]  spironolactone (ALDACTONE) 25 MG tablet Take 12.5 mg by mouth daily. 02/20/22   [provider]  zolpidem (AMBIEN) 5 MG tablet Take 5 mg by mouth at bedtime  as needed. 04/10/21   [provider]     Critical care time: 53 minutes     Noe Gens, MSN, APRN, NP-C, AGACNP-BC [facility] 07/03/2022, 9:51 AM   Please see [email or web address] for pager details.   From 7A-7P if no response, please call [phone] After hours, please call ELink [phone]

## 2022-07-03 NOTE — ED Triage Notes (Signed)
Pt BIB [facility] from home, c/o chest pain and shortness of breath on waking. Hx CABG, CHF, EMS reports decreased lung sounds, hypoxic @ 72% room air. Code STEMI activated in the field by EMS, given 324mg  asa, 2 SLNTG, duoneb, 125mg  solumedrol. En route, pt required respiratory support by BVM, EMS also reports pt had 2 runs of pulseless vtach.

## 2022-07-03 NOTE — Progress Notes (Signed)
The following ABG was drawn on the vent settings listed below:  vT 575mL RR: 16bpm PEEP: 5 FIO2: 100% iT: 0.9  Ventilator adjustments made after ABG: RR: 20bpm FIO2: 40% iT: 0.8  Spoke with CCM, order confirmed and adjusted.   Latest Reference Range & Units Most Recent  Sample type  ARTERIAL 07/03/22 08:27  Delivery systems  VENTILATOR 02/17/22 04:55  FIO2 % 40 02/17/22 04:55  Mode  PRESSURE REGULATED VOLUME CONTROL 02/17/22 04:55  pH, Arterial 7.35 - 7.45  7.323 (L) 07/03/22 08:27  pCO2 arterial 32 - 48 mmHg 58.8 (H) 07/03/22 08:27  pO2, Arterial 83 - 108 mmHg 521 (H) 07/03/22 08:27  pH, Ven 7.250 - 7.430  7.34 09/06/20 02:15  pCO2, Ven 44.0 - 60.0 mmHg 63 (H) 09/06/20 02:15  pO2, Ven 32.0 - 45.0 mmHg <31.0 (LL) 09/06/20 02:15  TCO2 22 - 32 mmol/L 32 07/03/22 08:27  Acid-Base Excess 0.0 - 2.0 mmol/L 2.0 07/03/22 08:27  Bicarbonate 20.0 - 28.0 mmol/L 30.5 (H) 07/03/22 08:27  O2 Saturation % 100 07/03/22 08:27  Patient temperature  37.0 02/17/22 04:55  Collection site  REVIEWED BY 02/17/22 04:55  Allens test (pass/fail) PASS  PASS 02/17/22 02:09  (LL): Data is critically low (L): Data is abnormally low (H): Data is abnormally high

## 2022-07-03 NOTE — ED Notes (Signed)
Pt in CT with this RN, Mindy RN, Dr Sedonia Small , RT - Pt in [place] at 120J - no change  Shocked at 200J - pulses present

## 2022-07-03 NOTE — ED Notes (Signed)
RT at bedside, pts upper dentured were removed & labeled in container at bedside.

## 2022-07-03 NOTE — Progress Notes (Signed)
ANTICOAGULATION CONSULT NOTE - Initial Consult  Pharmacy Consult for heparin Indication: atrial fibrillation  Allergies  Allergen Reactions   Chlorhexidine     Patient Measurements: Height: 5\' 7"  (170.2 cm) Weight: 77.1 kg (170 lb) IBW/kg (Calculated) : 66.1 Heparin Dosing Weight: TBW  Vital Signs: BP: 70/59 (10/05 0825) Pulse Rate: 72 (10/05 0825)  Labs: Recent Labs    07/03/22 0612 07/03/22 0621 07/03/22 0827  HGB 18.3* 19.7* 17.3*  HCT 57.0* 58.0* 51.0  PLT 243  --   --   CREATININE 1.33* 1.20  --   TROPONINIHS 53*  --   --     Estimated Creatinine Clearance: 53.6 mL/min (by C-G formula based on SCr of 1.2 mg/dL).   Medical History: 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, SVG-OM1, SVG-PDA, SVG-PL   Hyperlipemia    Hypertension    Leucocytosis    Liver dysfunction    Long term current use of clopidogrel    NSTEMI (non-ST elevated myocardial infarction) ([place])    approx 2016   Peripheral artery disease ([facility])    legs   Tobacco abuse    Wears dentures    full upper and lower    Medications:  Eliquis 5 mg PO BID  Assessment: 71 YO M PMH significant for Afib, CAD s/p CABG, HFrEF, HTN, and HLD who presented for respiratory distress. He developed VT requiring defibrillation while in CT with subsequent intubation. Due to possible bronchoscopy, pharmacy has been consulted for heparin dosing for continued treatment for Afib.    Last dose of Eliquis unknown. H/H WNL.  Goal of Therapy:  Heparin level 0.3-0.7 units/ml aPTT 66-102 seconds Monitor platelets by anticoagulation protocol: Yes  Plan:  Start heparin infusion at 1100 units/hr, no  bolus Check anti-Xa and aPTT level in 6 hours and daily while on heparin. Monitor H/H, PLTs, and s/sx of bleeding  Sinda Du, PharmD Candidate  07/03/2022,8:55 AM

## 2022-07-04 ENCOUNTER — Inpatient Hospital Stay (HOSPITAL_COMMUNITY): Payer: Medicare Other

## 2022-07-04 DIAGNOSIS — J9601 Acute respiratory failure with hypoxia: Secondary | ICD-10-CM | POA: Diagnosis not present

## 2022-07-04 DIAGNOSIS — Z720 Tobacco use: Secondary | ICD-10-CM

## 2022-07-04 DIAGNOSIS — R4182 Altered mental status, unspecified: Secondary | ICD-10-CM | POA: Diagnosis not present

## 2022-07-04 DIAGNOSIS — I472 Ventricular tachycardia, unspecified: Secondary | ICD-10-CM | POA: Diagnosis not present

## 2022-07-04 DIAGNOSIS — J449 Chronic obstructive pulmonary disease, unspecified: Secondary | ICD-10-CM

## 2022-07-04 DIAGNOSIS — I25118 Atherosclerotic heart disease of native coronary artery with other forms of angina pectoris: Secondary | ICD-10-CM

## 2022-07-04 DIAGNOSIS — I2489 Other forms of acute ischemic heart disease: Secondary | ICD-10-CM | POA: Diagnosis not present

## 2022-07-04 DIAGNOSIS — R000 Tachycardia, unspecified: Secondary | ICD-10-CM

## 2022-07-04 LAB — CBC
HCT: 41.1 % (ref 39.0–52.0)
Hemoglobin: 14.2 g/dL (ref 13.0–17.0)
MCH: 31.9 pg (ref 26.0–34.0)
MCHC: 34.5 g/dL (ref 30.0–36.0)
MCV: 92.4 fL (ref 80.0–100.0)
Platelets: 169 10*3/uL (ref 150–400)
RBC: 4.45 MIL/uL (ref 4.22–5.81)
RDW: 14.6 % (ref 11.5–15.5)
WBC: 17.1 10*3/uL — ABNORMAL HIGH (ref 4.0–10.5)
nRBC: 0 % (ref 0.0–0.2)

## 2022-07-04 LAB — GLUCOSE, CAPILLARY
Glucose-Capillary: 161 mg/dL — ABNORMAL HIGH (ref 70–99)
Glucose-Capillary: 130 mg/dL — ABNORMAL HIGH (ref 70–99)
Glucose-Capillary: 106 mg/dL — ABNORMAL HIGH (ref 70–99)
Glucose-Capillary: 201 mg/dL — ABNORMAL HIGH (ref 70–99)
Glucose-Capillary: 150 mg/dL — ABNORMAL HIGH (ref 70–99)
Glucose-Capillary: 132 mg/dL — ABNORMAL HIGH (ref 70–99)

## 2022-07-04 LAB — BASIC METABOLIC PANEL
Anion gap: 12 (ref 5–15)
BUN: 33 mg/dL — ABNORMAL HIGH (ref 8–23)
CO2: 27 mmol/L (ref 22–32)
Calcium: 8.9 mg/dL (ref 8.9–10.3)
Chloride: 100 mmol/L (ref 98–111)
Creatinine, Ser: 1.32 mg/dL — ABNORMAL HIGH (ref 0.61–1.24)
GFR, Estimated: 58 mL/min — ABNORMAL LOW (ref >60)
Glucose, Bld: 151 mg/dL — ABNORMAL HIGH (ref 70–99)
Potassium: 4.4 mmol/L (ref 3.5–5.1)
Sodium: 139 mmol/L (ref 135–145)

## 2022-07-04 LAB — STREP PNEUMONIAE URINARY ANTIGEN: Strep Pneumo Urinary Antigen: NEGATIVE

## 2022-07-04 LAB — ECHOCARDIOGRAM COMPLETE
Area-P 1/2: 3.83 cm2
Height: 67 in
S' Lateral: 3 cm
Weight: 2719.59 oz

## 2022-07-04 LAB — TRIGLYCERIDES: Triglycerides: 83 mg/dL (ref <150)

## 2022-07-04 LAB — MAGNESIUM
Magnesium: 2.2 mg/dL (ref 1.7–2.4)
Magnesium: 2.3 mg/dL (ref 1.7–2.4)

## 2022-07-04 LAB — TROPONIN I (HIGH SENSITIVITY)
Troponin I (High Sensitivity): 261 ng/L (ref <18)
Troponin I (High Sensitivity): 230 ng/L (ref <18)

## 2022-07-04 LAB — PHOSPHORUS
Phosphorus: 3 mg/dL (ref 2.5–4.6)
Phosphorus: 3.2 mg/dL (ref 2.5–4.6)

## 2022-07-04 LAB — HEPARIN LEVEL (UNFRACTIONATED): Heparin Unfractionated: >1.1 IU/mL — ABNORMAL HIGH (ref 0.30–0.70)

## 2022-07-04 LAB — APTT: aPTT: 106 seconds — ABNORMAL HIGH (ref 24–36)

## 2022-07-04 LAB — PROCALCITONIN: Procalcitonin: 0.3 ng/mL

## 2022-07-04 MED ORDER — AMIODARONE LOAD VIA INFUSION
150.0000 mg | Freq: Once | INTRAVENOUS | Status: AC
  Administered 2022-07-04: 150 mg via INTRAVENOUS
  Filled 2022-07-04: qty 83.34

## 2022-07-04 MED ORDER — APIXABAN 5 MG PO TABS
5.0000 mg | ORAL_TABLET | Freq: Two times a day (BID) | ORAL | Status: DC
  Administered 2022-07-04 – 2022-07-10 (×13): 5 mg via ORAL
  Filled 2022-07-04 (×13): qty 1

## 2022-07-04 MED ORDER — ASPIRIN 81 MG PO CHEW: 81.0000 mg | CHEWABLE_TABLET | Freq: Every day | ORAL | Status: DC

## 2022-07-04 MED ORDER — METOPROLOL TARTRATE 25 MG PO TABS
25.0000 mg | ORAL_TABLET | Freq: Two times a day (BID) | ORAL | Status: DC
  Administered 2022-07-04: 25 mg via ORAL
  Filled 2022-07-04: qty 1

## 2022-07-04 MED ORDER — AMIODARONE HCL IN DEXTROSE 360-4.14 MG/200ML-% IV SOLN
30.0000 mg/h | INTRAVENOUS | Status: DC
  Filled 2022-07-04 (×2): qty 200

## 2022-07-04 MED ORDER — CLOPIDOGREL BISULFATE 75 MG PO TABS
75.0000 mg | ORAL_TABLET | Freq: Every evening | ORAL | Status: DC
  Administered 2022-07-04 – 2022-07-09 (×6): 75 mg via ORAL
  Filled 2022-07-04 (×6): qty 1

## 2022-07-04 MED ORDER — AMIODARONE HCL IN DEXTROSE 360-4.14 MG/200ML-% IV SOLN
60.0000 mg/h | INTRAVENOUS | Status: DC
  Administered 2022-07-04 (×2): 60 mg/h via INTRAVENOUS

## 2022-07-04 MED ORDER — ATORVASTATIN CALCIUM 80 MG PO TABS
80.0000 mg | ORAL_TABLET | Freq: Every day | ORAL | Status: DC
  Administered 2022-07-04 – 2022-07-09 (×6): 80 mg via ORAL
  Filled 2022-07-04 (×2): qty 2
  Filled 2022-07-04: qty 1
  Filled 2022-07-04: qty 2
  Filled 2022-07-04 (×2): qty 1

## 2022-07-04 NOTE — Progress Notes (Addendum)
NAME:  Dennis Preston, MRN:  [id number], DOB:  [birth year], LOS: 1 ADMISSION DATE:  07/03/2022, CONSULTATION DATE:  07/03/22 REFERRING MD:  Mertha Finders, CHIEF COMPLAINT:  hypoxia   History of Present Illness:  Mr. Dennis Preston is a 71 y/o gentleman who presented 10/5 with acute respiratory distress and SOB.   His wife reports he has been more short of breath over the last 3 days.  He has been vaping since April of this year.  He has been using his oxygen intermittently in the last few days and has had decreased activity tolerance.  With EMS he had saturations 72% on RA at home. Code STEMI was activated by EMS and he was given aspirin, NTG, duoneb, solumedrol. He had 2 runs of pulseless VT with EMS.  Upon arrival the ED he was evaluated by cardiology, who felt this was more likely repolarization abnormality, less likely a STEMI. He had a recent LHC with open grafts from his CABG other than occlusion of vein graft to obtuse marginal.  Due to worsening mental status and hypoxia he was intubated. CT scan to evaluate for aortic dissection was negative for dissection but demonstrated bilateral airspace disease with multilobar pneumonia and findings consistent with interstitial pneumonitis. During CT scan he developed VT again requiring defibrillation. PCCM consulted for management.  Pertinent  Medical History  CAD s/p CABG HFrEF Tobacco abuse HTN HLD Afib ETOH abuse  Significant Hospital Events: Including procedures, antibiotic start and stop dates in addition to other pertinent events   10/5 Admitted, intubated, developed VT requiring defibrillation  Interim History / Subjective:  Propofol on hold this morning, alert and interactive, following commands appropriately. Placed on SAT/SBT and tolerating well. Will likely proceed with extubation.   Objective   Blood pressure (!) 106/53, pulse 73, temperature 99.5 F (37.5 C), temperature source Other (Comment), resp. rate 17, height 5\' 7"  (1.702 m), weight  77.1 kg, SpO2 95 %.    Vent Mode: CPAP;PSV FiO2 (%):  [40 %-50 %] 40 % Set Rate:  [20 bmp] 20 bmp Vt Set:  [530 mL] 530 mL PEEP:  [5 cmH20] 5 cmH20 Delta P (Amplitude):  [25] 25 Pressure Support:  [5 cmH20] 5 cmH20 Plateau Pressure:  [11 cmH20-30 cmH20] 17 cmH20   Intake/Output Summary (Last 24 hours) at 07/04/2022 0800 Last data filed at 07/04/2022 0600 Gross per 24 hour  Intake 1855.5 ml  Output 2025 ml  Net -169.5 ml   Filed Weights   07/03/22 0647 07/03/22 1700  Weight: 77.1 kg 77.1 kg    Examination: General: Adult male, lying in bed on SAT/SBT, mechanically ventilated HENT: MM pink/dry, pupils 36mm reactive Lungs: non-labored, lungs clear, thick blood tinge secretions Cardiovascular: S1S2 RRR, SR on monitor, minimal edema, warm and well perfused Abdomen: Soft, bsx4 hypoactive,  Extremities: thin skin with scattered bruises, dry  Neuro: Eyes open spontaneously, following commands in all extremities, no focal deficits.    Resolved Hospital Problem list     Assessment & Plan:   Acute on Chronic Hypoxic / Hypercarbic Respiratory Failure LUL Pulmonary Infiltrates / Multilobar Pneumonia Mild Acute Respiratory Acidosis  Hx of COPD Tobacco/Vape Use Differential dx includes AECOPD, interstitial pneumonitis / inflammatory process, ILD, bacterial PNA / aspiration.  Ongoing tobacco use via vaping.  -brovana, pulmicort, yupelri  -Azithromycin x 3 days.  Ceftriaxone empirically  -Solumedrol D 2, change to prednisone when tolerating PO -Respiratory pathogen panel negative -nicotine patch  -SAT/SBT, likely extubate.   Ventricular Tachycardia s/p Defibrillation x2 -appreciate Cardiology evaluation  -  no indication for LHC at this time  -Goal K > 4 and Mg > 2 -No arrhythmias overnight  HFrEF CAD s/p CABG HTN -continue home plavix -hold home eliquis, heparin infusion, aptt 106. Transition back to Eliquis once extubated and tolerating PO -hold home lasix  today  Pre-Diabetes A1c 5.9 -SSI, sensitive scale  -Goal BG 140-180  At Risk Malnutrition -TF per Nutrition, on hold for extubation -Monitor electrolytes  Paroxsymal Atrial Fibrillation -currently SR, heparin infusion as above, rate controlled  HLD -continue home lipitor   Alcohol Abuse -unclear timing / if still using, will discuss with patient once extubat4ed  Best Practice (right click and "Reselect all SmartList Selections" daily)  Diet/type: tubefeeds and NPO w/ meds via tube DVT prophylaxis: SCD Systemic anticoagulation with heparin drip  GI prophylaxis: PPI Lines: N/A Foley:  N/A Code Status:  full code Last date of multidisciplinary goals of care discussion:  10/6 Wife updated at bedside.  Labs   CBC: Recent Labs  Lab 07/03/22 0612 07/03/22 0621 07/03/22 0827 07/04/22 0540  WBC 19.6*  18.1*  --   --  17.1*  NEUTROABS 17.3*  --   --   --   HGB 16.1  18.3* 19.7* 17.3* 14.2  HCT 51.4  57.0* 58.0* 51.0 41.1  MCV 98.1  99.7  --   --  92.4  PLT 224  243  --   --  169     Basic Metabolic Panel: Recent Labs  Lab 07/03/22 0612 07/03/22 0621 07/03/22 0827 07/04/22 0540  NA 137 137 131* 139  K 4.8 5.1 5.5* 4.4  CL 100 101  --  100  CO2 27  --   --  27  GLUCOSE 216* 208*  --  151*  BUN 12 18  --  33*  CREATININE 1.33* 1.20  --  1.32*  CALCIUM 8.9  --   --  8.9  MG  --   --   --  2.2  PHOS  --   --   --  3.0    GFR: Estimated Creatinine Clearance: 48.7 mL/min (A) (by C-G formula based on SCr of 1.32 mg/dL (H)). Recent Labs  Lab 07/03/22 0612 07/03/22 0714 07/03/22 0937 07/04/22 0540  PROCALCITON  --   --  0.15  --   WBC 19.6*  18.1*  --   --  17.1*  LATICACIDVEN  --  2.0*  --   --      Liver Function Tests: Recent Labs  Lab 07/03/22 0612  AST 29  ALT 21  ALKPHOS 87  BILITOT 0.5  PROT 6.6  ALBUMIN 4.1    No results for input(s): "LIPASE", "AMYLASE" in the last 168 hours. No results for input(s): "AMMONIA" in the last 168  hours.  ABG    Component Value Date/Time   PHART 7.323 (L) 07/03/2022 0827   PCO2ART 58.8 (H) 07/03/2022 0827   PO2ART 521 (H) 07/03/2022 0827   HCO3 30.5 (H) 07/03/2022 0827   TCO2 32 07/03/2022 0827   O2SAT 100 07/03/2022 0827     Coagulation Profile: No results for input(s): "INR", "PROTIME" in the last 168 hours.  Cardiac Enzymes: No results for input(s): "CKTOTAL", "CKMB", "CKMBINDEX", "TROPONINI" in the last 168 hours.  HbA1C: Hgb A1c MFr Bld  Date/Time Value Ref Range Status  02/17/2022 08:57 AM 5.9 (H) 4.8 - 5.6 % Final    Comment:    (NOTE) Pre diabetes:          5.7%-6.4%  Diabetes:              >  6.4%  Glycemic control for   <7.0% adults with diabetes   08/27/2006 12:00 AM 5.8 %     CBG: Recent Labs  Lab 07/03/22 1130 07/03/22 1552 07/03/22 1935 07/03/22 2318 07/04/22 0315  GLUCAP 122* 155* 167* 175* 161*    Critical care time: 37 minutes     Paulita Fujita, ACNP [facility] 07/04/2022, 8:00 AM Please use secure chat From 7A-7P if no response, please call [phone] After hours, please call ELink [phone]

## 2022-07-04 NOTE — Progress Notes (Signed)
ANTICOAGULATION CONSULT NOTE  Pharmacy Consult for heparin Indication: atrial fibrillation  Allergies  Allergen Reactions   Chlorhexidine     Patient Measurements: Height: 5\' 7"  (170.2 cm) Weight: 77.1 kg (169 lb 15.6 oz) IBW/kg (Calculated) : 66.1 Heparin Dosing Weight: TBW  Vital Signs: Temp: 99.5 F (37.5 C) (10/06 0600) Temp Source: Other (Comment) (10/06 0600) BP: 106/53 (10/06 0600) Pulse Rate: 69 (10/06 0600)  Labs: Recent Labs    07/03/22 0612 07/03/22 0621 07/03/22 0827 07/03/22 0937 07/03/22 1830 07/04/22 0540  HGB 16.1  18.3* 19.7* 17.3*  --   --  14.2  HCT 51.4  57.0* 58.0* 51.0  --   --  41.1  PLT 224  243  --   --   --   --  169  APTT  --   --   --   --  80* 106*  HEPARINUNFRC  --   --   --   --  >1.10* >1.10*  CREATININE 1.33* 1.20  --   --   --  1.32*  TROPONINIHS 53*  --   --  260*  --   --      Estimated Creatinine Clearance: 48.7 mL/min (A) (by C-G formula based on SCr of 1.32 mg/dL (H)).   Assessment: 71 YO M PMH significant for Afib, CAD s/p CABG, HFrEF, HTN, and HLD who presented for respiratory distress. He developed VT requiring defibrillation while in CT with subsequent intubation. Pharmacy has been consulted for heparin dosing for continued treatment for Afib.  Last dose of Eliquis was on 10/4 PM.  Heparin level elevated at >1.1 units/mL and aPTT slightly supra-therapeutic at 106 sec.  Levels are not yet correlating.  No issues reported.  Goal of Therapy:  Heparin level 0.3-0.7 units/ml aPTT 66-102 seconds Monitor platelets by anticoagulation protocol: Yes  Plan:  Reduce heparin infusion to 1000 units/hr Check 8 hrs aPTT Daily heparin level, aPTT and CBC  Deonte Otting D. Mina Marble, PharmD, BCPS, [place] 07/04/2022, 7:05 AM

## 2022-07-04 NOTE — TOC Initial Note (Signed)
Transition of Care [facility]) - Initial/Assessment Note    Patient Details  Name: Dennis Preston MRN: [id number] Date of Birth: 1950/10/19  Transition of [facility]) CM/SW Contact:    Tom-Johnson, Renea Ee, RN Phone Number: 07/04/2022, 3:59 PM  Clinical Narrative:                  CM spoke with patient, spouse and daughter at bedside. Admitted for Ventricular Tachycardia. Patient had pulseless VT's, requiring defibrillation x 2. Was intubated, extubated today and on 4L O2. Uses home O2 as needed and CPAP machine from [place].  From home with wife, has five children. Independent with care and drive self prior to admission. Has a cane at home.  PCP is Tracie Harrier, MD and uses [facility].  Awaiting PT eval for disposition.  CM will continue to follow as patient progresses with care towards discharge.        Barriers to Discharge: Continued Medical Work up   Patient Goals and CMS Choice Patient states their goals for this hospitalization and ongoing recovery are:: To return home CMS Medicare.gov Compare Post Acute Care list provided to:: Patient    Expected Discharge Plan and Services     Discharge Planning Services: CM Consult   Living arrangements for the past 2 months: Single Family Home                                      Prior Living Arrangements/Services Living arrangements for the past 2 months: Single Family Home Lives with:: Spouse Patient language and need for interpreter reviewed:: Yes Do you feel safe going back to the place where you live?: Yes      Need for Family Participation in Patient Care: Yes (Comment) Care giver support system in place?: Yes (comment) Current home services: DME (O2, Cane) Criminal Activity/Legal Involvement Pertinent to Current Situation/Hospitalization: No - Comment as needed  Activities of Daily Living      Permission Sought/Granted Permission sought to share information with : Case Manager,  Family Supports Permission granted to share information with : Yes, Verbal Permission Granted              Emotional Assessment Appearance:: Appears stated age Attitude/Demeanor/Rapport: Engaged, Gracious Affect (typically observed): Accepting, Appropriate, Calm, Hopeful, Pleasant Orientation: : Oriented to Self, Oriented to Place, Oriented to  Time, Oriented to Situation Alcohol / Substance Use: Not Applicable Psych Involvement: No (comment)  Admission diagnosis:  Ventricular tachycardia (HCC) [I47.20] Acute respiratory failure with hypoxia (HCC) [J96.01] Altered mental status, unspecified altered mental status type [R41.82] Chest pain, unspecified type [R07.9] Patient Active Problem List   Diagnosis Date Noted   Ventricular tachycardia ([place]) 07/03/2022   Acute on chronic respiratory failure ([place]) 02/17/2022   Acute pulmonary edema (HCC)    Acute respiratory failure with hypoxia and hypercarbia (HCC)    S/P carotid endarterectomy 01/16/2021   Carotid stenosis, right 01/02/2021   Essential hypertension 12/18/2020   Hyperlipidemia 12/18/2020   Acute respiratory failure ([place]) 09/06/2020   CHF (congestive heart failure) (HCC)    Hypokalemia    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   Atherosclerosis of artery of extremity with rest pain ([place]) 05/23/2020   Chronic venous insufficiency 03/01/2020   COPD exacerbation ([place]) 02/13/2020   Aortic atherosclerosis ([place]) 08/08/2019   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   COPD with acute exacerbation ([place]) 06/13/2018  Tobacco use 01/14/2017   A-fib ([place]) 07/18/2015   Coagulopathy ([place]) 07/17/2015   Hypotension 07/14/2015   Leukocytosis 07/14/2015   PAD (peripheral artery disease) ([place]) 07/14/2015   S/P coronary artery bypass graft x 4 07/14/2015   Carotid atherosclerosis, bilateral 07/12/2015   Transient visual loss of right eye 07/11/2015   NSTEMI (non-ST elevated myocardial  infarction) ([place]) 07/10/2015   Amaurosis fugax 07/10/2015   Coronary artery disease 07/10/2015   Hypoxia 07/09/2015   ANXIETY 02/24/2007   ERECTILE DYSFUNCTION 02/24/2007   LIVER FUNCTION TESTS, ABNORMAL 02/24/2007   PCP:  Tracie Harrier, MD Pharmacy:   [facility], [place] [address] Phone: [phone] Fax: [phone]     Social Determinants of Health (SDOH) Interventions    Readmission Risk Interventions     No data to display

## 2022-07-04 NOTE — Procedures (Signed)
Extubation Procedure Note  Patient Details:   Name: Dennis Preston DOB: 1951/09/25 MRN: [id number]   Airway Documentation:    Vent end date: 07/04/22 Vent end time: 0845   Evaluation  O2 sats: stable throughout Complications: No apparent complications Patient did tolerate procedure well. Bilateral Breath Sounds: Clear, Diminished   Yes  Gonzella Lex 07/04/2022, 8:50 AM

## 2022-07-04 NOTE — Consult Note (Addendum)
**Note Dennis Preston** Cardiology Consultation   Patient ID: [name] MRN: [id number]; DOB: 1951-01-04  Admit date: 07/03/2022 Date of Consult: 07/04/2022  PCP:  Tracie Harrier, MD   [place] [facility] Cardiologist: Jefm Bryant cardiology Click here to update MD or APP on Care Team, Refresh:1}     Patient Profile:   Dennis Preston is a 71 y.o. male with a hx of CAD s/p CABG (LIMA to LAD, vein graft to ramus, vein graft to PDA now with occluded vein graft to obtuse marginal), chronic diastolic heart failure, aortic atherosclerosis, peripheral vascular disease s/p bilateral femoral endarterectomies/endovascular repair, paroxysmal afib, HTN, HLD, tobacco abuse, COPD, ILD, ETOH abuse who is being seen 07/04/2022 for the evaluation of recurrent VT at the request of Dr. Tacy Learn.  History of Present Illness:   Dennis Preston presented to [facility] ED by EMS after waking up on 10/5 with shortness of breath and chest pain.  EMS reported patient had decreased lung sounds and was hypoxic with an O2 saturation of 72% on their initial contact.  Given ECG changes, code STEMI was activated and patient received 324 mg of aspirin, sublingual nitro x2, as well as DuoNeb and 125 mg of Solu-Medrol.  While in route to the hospital patient required ventilation support by bag-valve-mask.  EMS also reported 2 runs of V. Tach like rhythm. Upon patient's initial arrival to the ED he was evaluated by Dr. Ali Lowe who felt that ECG abnormalities were primarily a repolarization abnormality.  Given hypoxia and declining mental status, patient was intubated for airway management.  Patient also underwent CT scan to evaluate for aortic dissection, found negative for vascular abnormalities.  However bilateral airspace disease with multi lobar pneumonia and interstitial pneumonitis noted.  While patient was undergoing CT scan he again developed reported ventricular tachycardia with defibrillation x2. Per ED provider "Rhythm change noted well  on the CT table, heart rate increased to 150-160.  Morphology concerning for ventricular tachycardia.  Still with a strong pulse.  Given the patient remained intubated, sedated, cardioversion was attempted x2." Amiodarone bolus followed by amiodarone infusion given.   On 10/6, cardiology was reengaged after concern from primary team that patient had recurrence of ventricular tachycardia (no loss of pulses today or defibrillation).  Patient with a recent left heart catheterization on 02/18/2022.  Relatively stable severe three-vessel CAD noted.  Patient with patent SVG to RPDA/RPL, patent SVG to ramus intermedius, known occluded SVG to left circumflex/OM, patent LIMA to LAD with primarily retrograde filling from native coronary artery. 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  Gradient across left subclavian is at least 50 to 60 mmHg. Pt was transferred to [facility] and underwent successful PCI to the left subclavian on 5/24. He is followed outpatient by [facility].   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, SVG-OM1, SVG-PDA, SVG-PL   Hyperlipemia    Hypertension    Leucocytosis    Liver dysfunction    Long term current use of clopidogrel    NSTEMI (non-ST elevated myocardial infarction) ([place])    approx 2016   Peripheral artery disease (HCC)    legs  Tobacco abuse    Wears dentures    full upper and lower    Past Surgical History:  Procedure Laterality Date   APPLICATION OF WOUND VAC Right 06/22/2020   Procedure: APPLICATION OF WOUND VAC;  Surgeon: Algernon Huxley, MD;  Location: [facility] ORS;  Service: Vascular;  Laterality: Right;   CARDIAC CATHETERIZATION N/A  07/10/2015   Procedure: Right/Left Heart Cath and Coronary Angiography;  Surgeon: Yolonda Kida, MD;  Location: [facility];  Service: Cardiovascular;  Laterality: N/A;   CORONARY ARTERY BYPASS GRAFT  07/13/2015   [facility].  4 vessel   ENDARTERECTOMY Right 01/02/2021   Procedure: ENDARTERECTOMY CAROTID;  Surgeon: Algernon Huxley, MD;  Location: [facility] ORS;  Service: Vascular;  Laterality: Right;   ENDARTERECTOMY FEMORAL Bilateral 05/23/2020   Procedure: ENDARTERECTOMY FEMORAL BILATERAL SUPERFICIAL FEMORAL ARTERY STENTS ;  Surgeon: Katha Cabal, MD;  Location: [facility] ORS;  Service: Vascular;  Laterality: Bilateral;   FALSE ANEURYSM REPAIR Left 07/22/2020   Procedure: REPAIR LEFT FEMORAL PSEUDOANUERYSM;  Surgeon: Evaristo Bury, MD;  Location: [facility] ORS;  Service: Vascular;  Laterality: Left;   INSERTION OF ILIAC STENT Left 05/23/2020   Procedure: INSERTION OF ILIAC STENT;  Surgeon: Katha Cabal, MD;  Location: [facility] ORS;  Service: Vascular;  Laterality: Left;   LEFT HEART CATH AND CORS/GRAFTS ANGIOGRAPHY N/A 01/23/2020   Procedure: LEFT HEART CATH AND CORS/GRAFTS ANGIOGRAPHY;  Surgeon: Yolonda Kida, MD;  Location: [facility];  Service: Cardiovascular;  Laterality: N/A;   LEFT HEART CATH AND CORS/GRAFTS ANGIOGRAPHY N/A 02/18/2022   Procedure: LEFT HEART CATH AND CORS/GRAFTS ANGIOGRAPHY;  Surgeon: Andrez Grime, MD;  Location: [place] [facility];  Service: Cardiovascular;  Laterality: N/A;   LOWER EXTREMITY ANGIOGRAPHY Right 04/03/2020   Procedure: LOWER EXTREMITY ANGIOGRAPHY;  Surgeon: Katha Cabal, MD;  Location: [place] [facility];  Service: Cardiovascular;  Laterality: Right;   OLECRANON BURSECTOMY Left 05/06/2019   Procedure: OLECRANON BURSECTOMY AND DEBRIDEMENT;  Surgeon: Leim Fabry, MD;  Location: [facility] ORS;  Service: Orthopedics;  Laterality: Left;   WOUND DEBRIDEMENT Right 06/22/2020   Procedure: DEBRIDEMENT WOUND RIGHT GROIN;  Surgeon: Algernon Huxley, MD;   Location: [facility] ORS;  Service: Vascular;  Laterality: Right;     Home Medications:  Prior to Admission medications   Medication Sig Start Date End Date Taking? Authorizing Provider  albuterol (PROVENTIL) (2.5 MG/3ML) 0.083% nebulizer solution Take 3 mLs (2.5 mg total) by nebulization every 6 (six) hours as needed for wheezing or shortness of breath. 02/15/20  Yes Jennye Boroughs, MD  albuterol (VENTOLIN HFA) 108 (90 Base) MCG/ACT inhaler Inhale 1-2 puffs into the lungs every 6 (six) hours as needed for wheezing or shortness of breath. 02/15/20  Yes Jennye Boroughs, MD  atorvastatin (LIPITOR) 80 MG tablet Take 1 tablet (80 mg total) by mouth daily at 6 PM. 07/10/15  Yes Sainani, Belia Heman, MD  clopidogrel (PLAVIX) 75 MG tablet Take 75 mg by mouth every evening.  04/18/16  Yes [provider]  ELIQUIS 5 MG TABS tablet Take 5 mg by mouth 2 (two) times daily. 02/20/22  Yes [provider]  Fluticasone-Umeclidin-Vilant (TRELEGY ELLIPTA) 100-62.5-25 MCG/INH AEPB Inhale 1 puff into the lungs every other day. 01/27/20  Yes [provider]  furosemide (LASIX) 40 MG tablet Take 40 mg by mouth 2 (two) times daily. 12/26/19  Yes [provider]  losartan (COZAAR) 25 MG tablet Take 25 mg by mouth daily. 06/23/22  Yes [provider]  OXYGEN Inhale 2 L into the lungs daily as needed (activity).   Yes [provider]  spironolactone (ALDACTONE) 25 MG tablet Take 12.5 mg by mouth daily. 02/20/22  Yes [provider]  zolpidem (AMBIEN) 5 MG tablet Take 5 mg by mouth at bedtime as needed for sleep. 04/10/21  Yes [provider]  aspirin 81 MG chewable tablet Chew by mouth. Patient not taking: Reported on 07/03/2022 02/21/22 03/20/23  [provider]    Inpatient Medications: Scheduled Meds:  apixaban  5 mg Oral BID   arformoterol  15 mcg Nebulization BID   atorvastatin  80 mg Oral q1800   budesonide (PULMICORT) nebulizer solution  0.5 mg  Nebulization BID   clopidogrel  75 mg Oral QPM   insulin aspart  0-9 Units Subcutaneous Q4H   methylPREDNISolone (SOLU-MEDROL) injection  40 mg Intravenous Daily   metoprolol tartrate  25 mg Oral BID   nicotine  21 mg Transdermal Daily   mouth rinse  15 mL Mouth Rinse Q2H   revefenacin  175 mcg Nebulization Daily   Continuous Infusions:  sodium chloride 10 mL/hr at 07/04/22 1500   azithromycin Stopped (07/04/22 0908)   cefTRIAXone (ROCEPHIN)  IV Stopped (07/04/22 0629)   PRN Meds: sodium chloride, acetaminophen, docusate, ondansetron (ZOFRAN) IV, polyethylene glycol  Allergies:    Allergies  Allergen Reactions   Chlorhexidine     Social History:   Social History   Socioeconomic History   Marital status: Married    Spouse name: Not on file   Number of children: Not on file   Years of education: Not on file   Highest education level: Not on file  Occupational History   Not on file  Tobacco Use   Smoking status: Some Days    Packs/day: 2.50    Years: 54.00    Total pack years: 135.00    Types: Cigarettes   Smokeless tobacco: Never   Tobacco comments:    0.5PPD 09/17/21  Vaping Use   Vaping Use: Never used  Substance and Sexual Activity   Alcohol use: Not Currently    Comment: quit over 15 yrs ago   Drug use: Never   Sexual activity: Not on file  Other Topics Concern   Not on file  Social History Narrative   Not on file   Social Determinants of Health   Financial Resource Strain: Not on file  Food Insecurity: Not on file  Transportation Needs: Not on file  Physical Activity: Not on file  Stress: Not on file  Social Connections: Not on file  Intimate Partner Violence: Not on file    Family History:    Family History  Problem Relation Age of Onset   Depression Sister      ROS:  Please see the history of present illness.   All other ROS reviewed and negative.     Physical Exam/Data:   Vitals:   07/04/22 1200 07/04/22 1300 07/04/22 1400 07/04/22  1500  BP: (!) 113/52 (!) 101/50 (!) 98/51 (!) 151/62  Pulse: 69 83 72 90  Resp: 18 (!) 28 (!) 23 20  Temp: 99.5 F (37.5 C) 99.5 F (37.5 C) 99.7 F (37.6 C) 99.7 F (37.6 C)  TempSrc:      SpO2: 92% (!) 89% 90% 94%  Weight:      Height:        Intake/Output Summary (Last 24 hours) at 07/04/2022 1700 Last data filed at 07/04/2022 1500 Gross per 24  hour  Intake 1680.47 ml  Output 2025 ml  Net -344.53 ml      07/03/2022    5:00 PM 07/03/2022    6:47 AM 02/25/2022    2:43 PM  Last 3 Weights  Weight (lbs) 169 lb 15.6 oz 170 lb 156 lb 2 oz  Weight (kg) 77.1 kg 77.111 kg 70.818 kg     Body mass index is 26.62 kg/m.  General:  Well nourished, well developed, in no acute distress HEENT: normal Neck: no JVD Vascular: No carotid bruits; Distal pulses 2+ bilaterally Cardiac:  normal S1, S2; RRR; systolic murmur noted Lungs:  Diffusely diminished without notable wheezing or rales. Abd: soft, nontender, no hepatomegaly  Ext: no edema Musculoskeletal:  No deformities, BUE and BLE strength normal and equal Skin: warm and dry  Neuro:  CNs 2-12 intact, no focal abnormalities noted Psych:  Normal affect   EKG:  No ECG from today. Reordered.  Telemetry:  Telemetry was personally reviewed and demonstrates:  sinus rhythm with intermittent interventricular conduction delay pattern  Relevant CV Studies: 02/17/2022 TTE  Indications:     NSTEMI I21.4     History:         Patient has prior history of Echocardiogram examinations,  most                   recent 06/13/2018. Prior CABG, COPD, Arrythmias:Atrial                   Fibrillation; Risk Factors:Hypertension. NSTEMI, tobacco  abuse.     Sonographer:     Sherrie Sport  Referring Phys:  [id number] Kathrin Ruddy ORGEL  Diagnosing Phys: Donnelly Angelica      Sonographer Comments: Technically challenging study due to limited  acoustic windows, no parasternal window and suboptimal subcostal window.  Image acquisition challenging due to COPD.   IMPRESSIONS     1. Poor endocardial definition, so difficult to r/o anteroseptal WMA.  Left ventricular ejection fraction, by estimation, is 55 to 60%. Left  ventricular ejection fraction by 2D MOD biplane is 56.7 %. The left  ventricle has normal function. The left  ventricle has no regional wall motion abnormalities. Left ventricular  diastolic parameters were normal.   2. Right ventricular systolic function is normal. The right ventricular  size is normal.   3. Left atrial size was mildly dilated.   4. The mitral valve is normal in structure. No evidence of mitral valve  regurgitation. No evidence of mitral stenosis.   5. The aortic valve is normal in structure. Aortic valve regurgitation is  not visualized. No aortic stenosis is present.   FINDINGS   Left Ventricle: Poor endocardial definition, so difficult to r/o  anteroseptal WMA. Left ventricular ejection fraction, by estimation, is 55  to 60%. Left ventricular ejection fraction by 2D MOD biplane is 56.7 %.  The left ventricle has normal function.  The left ventricle has no regional wall motion abnormalities. The left  ventricular internal cavity size was normal in size. Suboptimal image  quality limits for assessment of left ventricular hypertrophy. Left  ventricular diastolic parameters were normal.   Right Ventricle: The right ventricular size is normal. Right vetricular  wall thickness was not well visualized. Right ventricular systolic  function is normal.   Left Atrium: Left atrial size was mildly dilated.   Right Atrium: Right atrial size was normal in size.   Pericardium: There is no evidence of pericardial effusion.   Mitral Valve: The mitral valve  is normal in structure. No evidence of  mitral valve regurgitation. No evidence of mitral valve stenosis. MV peak  gradient, 7.4 mmHg. The mean mitral valve gradient is 2.0 mmHg.   Tricuspid Valve: The tricuspid valve is normal in structure. Tricuspid  valve  regurgitation is trivial.   Aortic Valve: The aortic valve is normal in structure. Aortic valve  regurgitation is not visualized. No aortic stenosis is present. Aortic  valve mean gradient measures 2.5 mmHg. Aortic valve peak gradient measures  4.6 mmHg. Aortic valve area, by VTI  measures 3.56 cm.   Pulmonic Valve: The pulmonic valve was not assessed.   Aorta: The aortic root was not well visualized.   IAS/Shunts: No atrial level shunt detected by color flow Doppler.   02/18/2022 LHC    Prox RCA lesion is 70% stenosed.   Mid RCA lesion is 90% stenosed.   RPAV lesion is 100% stenosed.   Dist Graft lesion before RPAV  is 50% stenosed.   Origin to Prox Graft lesion is 100% stenosed.   Mid LM to Dist LM lesion is 90% stenosed.   Ramus lesion is 90% stenosed.   Mid LAD lesion is 70% stenosed.   The left ventricular systolic function is normal.   LV end diastolic pressure is mildly elevated.   The left ventricular ejection fraction is 50-55% by visual estimate.   There is mild aortic valve stenosis.   Conclusion: Severe native three-vessel coronary artery disease similar to previous. Patent SVG to RPDA/RPL; diffuse disease in RPL system. 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  Gradient across left subclavian is at least 50 to 60 mmHg. Overall normal left ventricular systolic function with mild anterior hypokinesis  Diagnostic Dominance: Right   Pt was transferred to [facility] and underwent successful PCI to the left subclavian on 5/24.  07/04/22 TTE  IMPRESSIONS     1. Left ventricular ejection fraction, by estimation, is 55 to 60%. The  left ventricle has normal function. The left ventricle has no regional  wall motion abnormalities. Left ventricular diastolic parameters are  consistent with Grade I diastolic   dysfunction (impaired relaxation).   2. Right ventricular systolic function is normal. The right ventricular  size is normal. Tricuspid regurgitation signal is inadequate for assessing  PA pressure.   3. The mitral valve is normal in structure. Trivial mitral valve  regurgitation.   4. The aortic valve is tricuspid. There is mild calcification of the  aortic valve. There is mild thickening of the aortic valve. Aortic valve  regurgitation is not visualized. Aortic valve sclerosis/calcification is  present, without any evidence of  aortic stenosis.   5. The inferior vena cava is normal in size with greater than 50%  respiratory variability, suggesting right atrial pressure of 3 mmHg.   Comparison(s): No significant change from prior study.   FINDINGS   Left Ventricle: Left ventricular ejection fraction, by estimation, is 55  to 60%. The left ventricle has normal function. The left ventricle has no  regional wall motion abnormalities. The left ventricular internal cavity  size was normal in size. There is   no left ventricular hypertrophy. Left ventricular diastolic parameters  are consistent with Grade I diastolic dysfunction (impaired relaxation).   Right Ventricle: The right ventricular size is normal. No increase in  right ventricular wall thickness. Right ventricular systolic function  is  normal. Tricuspid regurgitation signal is inadequate for assessing PA  pressure.   Left Atrium: Left atrial size was normal in size.   Right Atrium: Right atrial size was normal in size.   Pericardium: There is no evidence of pericardial effusion.   Mitral Valve: The mitral valve is normal in structure. Trivial mitral  valve regurgitation.   Tricuspid Valve: The tricuspid valve is normal in structure. Tricuspid  valve regurgitation is trivial.   Aortic Valve: The aortic valve is tricuspid. There is mild calcification  of the aortic valve. There is mild thickening of the aortic valve.  Aortic  valve regurgitation is not visualized. Aortic valve  sclerosis/calcification is present, without any  evidence of aortic stenosis.   Pulmonic Valve: The pulmonic valve was normal in structure. Pulmonic valve  regurgitation is trivial.   Aorta: The aortic root is normal in size and structure.   Venous: The inferior vena cava is normal in size with greater than 50%  respiratory variability, suggesting right atrial pressure of 3 mmHg.   IAS/Shunts: The atrial septum is grossly normal.   Laboratory Data:  High Sensitivity Troponin:   Recent Labs  Lab 07/03/22 0612 07/03/22 0937 07/04/22 1350  TROPONINIHS 53* 260* 261*     Chemistry Recent Labs  Lab 07/03/22 0612 07/03/22 0621 07/03/22 0827 07/04/22 0540  NA 137 137 131* 139  K 4.8 5.1 5.5* 4.4  CL 100 101  --  100  CO2 27  --   --  27  GLUCOSE 216* 208*  --  151*  BUN 12 18  --  33*  CREATININE 1.33* 1.20  --  1.32*  CALCIUM 8.9  --   --  8.9  MG  --   --   --  2.2  GFRNONAA 58*  --   --  58*  ANIONGAP 10  --   --  12    Recent Labs  Lab 07/03/22 0612  PROT 6.6  ALBUMIN 4.1  AST 29  ALT 21  ALKPHOS 87  BILITOT 0.5   Lipids  Recent Labs  Lab 07/04/22 0540  TRIG 83    Hematology Recent Labs  Lab 07/03/22 0612 07/03/22 0621 07/03/22 0827 07/04/22 0540  WBC 19.6*  18.1*  --   --  17.1*  RBC 5.24  5.72  --   --  4.45  HGB 16.1  18.3* 19.7* 17.3* 14.2  HCT 51.4  57.0* 58.0* 51.0 41.1  MCV 98.1  99.7  --   --  92.4  MCH 30.7  32.0  --   --  31.9  MCHC 31.3  32.1  --   --  34.5  RDW 14.5  14.5  --   --  14.6  PLT 224  243  --   --  169   Thyroid No results for input(s): "TSH", "FREET4" in the last 168 hours.  BNP Recent Labs  Lab 07/03/22 0612  BNP 166.9*    DDimer No results for input(s): "DDIMER" in the last 168 hours.   Radiology/Studies:  ECHOCARDIOGRAM COMPLETE  Result Date: 07/04/2022    ECHOCARDIOGRAM REPORT   Patient Name:   GUYTON MARESCA Date of Exam: 07/04/2022  Medical Rec #:  [id number]       Height:       67.0 in Accession #:    [id number]      Weight:       170.0 lb Date of Birth:  October 01, 1950  BSA:          1.887 m Patient Age:    66 years        BP:           98/51 mmHg Patient Gender: M               HR:           91 bpm. Exam Location:  Inpatient Procedure: 2D Echo, Color Doppler and Cardiac Doppler Indications:    Acute Ischemic Heart Disease i24.9  History:        Patient has prior history of Echocardiogram examinations, most                 recent 02/17/2022. CAD, Prior CABG, COPD, Arrythmias:Atrial                 Fibrillation; Risk Factors:Hypertension and Dyslipidemia.  Sonographer:    Raquel Sarna Senior RDCS Referring Phys: [id number] Lily Kocher  Sonographer Comments: Technically difficult due to lung interference from COPD IMPRESSIONS  1. Left ventricular ejection fraction, by estimation, is 55 to 60%. The left ventricle has normal function. The left ventricle has no regional wall motion abnormalities. Left ventricular diastolic parameters are consistent with Grade I diastolic dysfunction (impaired relaxation).  2. Right ventricular systolic function is normal. The right ventricular size is normal. Tricuspid regurgitation signal is inadequate for assessing PA pressure.  3. The mitral valve is normal in structure. Trivial mitral valve regurgitation.  4. The aortic valve is tricuspid. There is mild calcification of the aortic valve. There is mild thickening of the aortic valve. Aortic valve regurgitation is not visualized. Aortic valve sclerosis/calcification is present, without any evidence of aortic stenosis.  5. The inferior vena cava is normal in size with greater than 50% respiratory variability, suggesting right atrial pressure of 3 mmHg. Comparison(s): No significant change from prior study. FINDINGS  Left Ventricle: Left ventricular ejection fraction, by estimation, is 55 to 60%. The left ventricle has normal function. The left ventricle has no regional  wall motion abnormalities. The left ventricular internal cavity size was normal in size. There is  no left ventricular hypertrophy. Left ventricular diastolic parameters are consistent with Grade I diastolic dysfunction (impaired relaxation). Right Ventricle: The right ventricular size is normal. No increase in right ventricular wall thickness. Right ventricular systolic function is normal. Tricuspid regurgitation signal is inadequate for assessing PA pressure. Left Atrium: Left atrial size was normal in size. Right Atrium: Right atrial size was normal in size. Pericardium: There is no evidence of pericardial effusion. Mitral Valve: The mitral valve is normal in structure. Trivial mitral valve regurgitation. Tricuspid Valve: The tricuspid valve is normal in structure. Tricuspid valve regurgitation is trivial. Aortic Valve: The aortic valve is tricuspid. There is mild calcification of the aortic valve. There is mild thickening of the aortic valve. Aortic valve regurgitation is not visualized. Aortic valve sclerosis/calcification is present, without any evidence of aortic stenosis. Pulmonic Valve: The pulmonic valve was normal in structure. Pulmonic valve regurgitation is trivial. Aorta: The aortic root is normal in size and structure. Venous: The inferior vena cava is normal in size with greater than 50% respiratory variability, suggesting right atrial pressure of 3 mmHg. IAS/Shunts: The atrial septum is grossly normal.  LEFT VENTRICLE PLAX 2D LVIDd:         4.90 cm   Diastology LVIDs:         3.00 cm   LV e' medial:    7.51 cm/s LV PW:  1.00 cm   LV E/e' medial:  11.9 LV IVS:        0.70 cm   LV e' lateral:   12.20 cm/s LVOT diam:     2.20 cm   LV E/e' lateral: 7.3 LV SV:         78 LV SV Index:   41 LVOT Area:     3.80 cm  RIGHT VENTRICLE TAPSE (M-mode): 1.4 cm LEFT ATRIUM             Index        RIGHT ATRIUM           Index LA diam:        3.10 cm 1.64 cm/m   RA Area:     13.80 cm LA Vol (A2C):   48.1  ml 25.49 ml/m  RA Volume:   31.30 ml  16.59 ml/m LA Vol (A4C):   23.6 ml 12.51 ml/m LA Biplane Vol: 37.1 ml 19.66 ml/m  AORTIC VALVE LVOT Vmax:   109.00 cm/s LVOT Vmean:  69.500 cm/s LVOT VTI:    0.204 m  AORTA Ao Root diam: 3.00 cm MITRAL VALVE MV Area (PHT): 3.83 cm     SHUNTS MV Decel Time: 198 msec     Systemic VTI:  0.20 m MV E velocity: 89.10 cm/s   Systemic Diam: 2.20 cm MV A velocity: 133.00 cm/s MV E/A ratio:  0.67 Gwyndolyn Kaufman MD Electronically signed by Gwyndolyn Kaufman MD Signature Date/Time: 07/04/2022/3:33:43 PM    Final    DG Chest Port 1 View  Result Date: 07/04/2022 CLINICAL DATA:  Hypoxic acute ventilator dependent respiratory failure. EXAM: PORTABLE CHEST 1 VIEW COMPARISON:  Chest CT yesterday at 6:37 a.m. Portable chest yesterday at 6:24 a.m. FINDINGS: 5:01 a.m. ETT tip is 4 cm from the carina. NGT is within the stomach with the side hole and tip not filmed. A left subclavian artery stent is again noted as well as CABG change. Interstitial and patchy hazy infiltrates of left-greater-than-right lung fields are again noted, more widespread and confluent on the left. The left lung opacities are less dense today showing improvement. There is no new or worsening lung opacity. Trace pleural effusions are similar. The cardiac size is normal. There is aortic atherosclerosis with stable mediastinum. Background emphysematous disease. Osteopenia and degenerative change. IMPRESSION: Left-greater-than-right bilateral airspace disease, on the left mildly improved, otherwise unchanged. Electronically Signed   By: Telford Nab M.D.   On: 07/04/2022 06:26   DG Abd 1 View  Result Date: 07/03/2022 CLINICAL DATA:  NG tube check EXAM: ABDOMEN - 1 VIEW COMPARISON:  CT 07/03/2022 FINDINGS: NG tube extends into stomach. The tube is looped once in the fundus with tip extending to the gastric antrum. IMPRESSION: NG tube with tip in the distal stomach. Electronically Signed   By: Suzy Bouchard M.D.    On: 07/03/2022 11:15   CT Angio Chest/Abd/Pel for Dissection W and/or Wo Contrast  Result Date: 07/03/2022 CLINICAL DATA:  Chest and back pain.  Aortic dissection suspected. EXAM: CT ANGIOGRAPHY CHEST, ABDOMEN AND PELVIS TECHNIQUE: Non-contrast CT of the chest was initially obtained. Multidetector CT imaging through the chest, abdomen and pelvis was performed using the standard protocol during bolus administration of intravenous contrast. Multiplanar reconstructed images and MIPs were obtained and reviewed to evaluate the vascular anatomy. RADIATION DOSE REDUCTION: This exam was performed according to the departmental dose-optimization program which includes automated exposure control, adjustment of the mA and/or kV according to patient size  and/or use of iterative reconstruction technique. CONTRAST:  40mL OMNIPAQUE IOHEXOL 350 MG/ML SOLN COMPARISON:  Portable chest today, chest x-ray 02/17/2022, and chest CTs without contrast 01/03/2022 and 09/05/2021 FINDINGS: CTA CHEST FINDINGS Support tubes: ETT is in place with tip 4.4 cm from carina. NGT is coiled in the stomach with the tip in the antrum. Cardiovascular: The cardiac size is normal. Coronary arteries are heavily calcified and there is no pericardial effusion. Pulmonary arteries and veins are normal caliber. The pulmonary arteries are centrally clear. There are old CABG changes again noted. The thoracic aorta is within normal caliber limits with moderate to heavy mixed plaque throughout, additional plaques involving the great vessels greatest in the left subclavian artery with interval insertion of a 2.5 cm in length stent in the proximal left subclavian artery which appears grossly patent. There is a 40% mixed plaque origin stenosis of the right subclavian artery. There is no aortic stenosis, dissection or aneurysm. Mediastinum/Nodes: No enlarged mediastinal, hilar, or axillary lymph nodes. Thyroid gland, trachea, and esophagus demonstrate no significant  findings. Lungs/Pleura: The lungs moderately emphysematous with centrilobular changes predominating. There are patchy alveolar infiltrates newly noted in the left upper lobe, increased interstitial and ground-glass infiltrates in the right middle lobe and additional interstitial and alveolar disease in the lateral basal left lower lobe. There is increased interlobular septal thickening in the bases consistent with interstitial edema as well, versus interstitial pneumonitis and there are minimal pleural effusions. Diffuse bronchial thickening appears similar. There is fluid and debris in the left upper lobe lingular main bronchus. The last CT demonstrated a new 5 mm left upper lobe nodule anteriorly just above the level of the aortic arch. If this is still present it is obscured by airspace disease. There is a stable irregular 8 mm right upper lobe nodule on 7:38, stable rounded right upper lobe noncalcified 7 mm nodule on 7:32, stable 5 mm right lower lobe nodule on 7:97. Musculoskeletal: Osteopenia with degenerative changes of the spine. No acute or significant osseous findings. Review of the MIP images confirms the above findings. CTA ABDOMEN AND PELVIS FINDINGS VASCULAR Aorta: There is heavy mixed plaque causing up to 30% stenosis. Some of the soft plaque appears mildly ulcerative but no penetrating aortic ulcer is seen or dissection. No aneurysm. Celiac: Mixed plaques at the vessel origin causing up to 60% stenosis 8 mm distal to the vessel origin. There are patchy calcifications and moderate stenosis in the splenic artery. The branch arteries otherwise are generally clear. SMA: There are patchy calcific plaques. The proximal 1.8 cm the vessel demonstrates a high-grade stenosis. The vessel opens up again up to the inflection point, distal to the inflection point a short-segment with up to 50% stenosis continues to the bifurcation with the bifurcation vessels patent. Renals: Both proximal renal arteries are  heavily calcified. On the left this is associated with an 80% short-segment vessel stenosis just distal to an early bifurcation of the vessel. The smaller upper pole artery has a high-grade origin stenosis and is otherwise clear. The larger artery with the 80% stenosis is otherwise clear. Despite the heavy calcifications there is no more than 50-60% stenosis in the proximal 2 cm of the vessel. The vessel is otherwise patent. IMA: 50% mixed plaque origin stenosis.  Otherwise clear. Inflow: There is heavy mixed plaque in both common iliac arteries with up to 50% irregular stenosis in the common iliacs. The left internal iliac artery occludes shortly distal to its origin and does not reconstitute. The  right internal iliac artery is moderately stenotic but shows flow. There is irregular stenosis up to 60-70% and heavy calcification in the right external iliac artery. There is a long stent in the left external iliac artery, with a high-grade stenosis in the distal aspect of the stented vessel. Proximal outflow: The circumflex and deep femoral arteries both show flow but both proximal superficial femoral arteries are occluded and appear to have been previously stented. There is flow in both common femoral arteries. Veins: No obvious venous abnormality within the limitations of this arterial phase study. Review of the MIP images confirms the above findings. NON-VASCULAR Hepatobiliary: No focal liver abnormality is seen. No gallstones, gallbladder wall thickening, or biliary dilatation. Pancreas: No focal abnormality. Spleen: No focal abnormality. Adrenals/Urinary Tract: Slight nodular thickening of the adrenal glands is unchanged. There are scattered subcentimeter hypodensities in both kidneys which are too small to characterize. No follow-up imaging is recommended.JACR 2018 Feb; 264-273, Management of the Incidental RenalMass on CT, [employer] 2021; 814-848, Bosniak Classification of Cystic Renal Masses, Version 2019.  There is no urinary stone or obstruction. There is no bladder thickening. Stomach/Bowel: There are thickened folds in the stomach, duodenal mid jejunum. There is no bowel obstruction or inflammatory change. The appendix is normal caliber. There is diffuse colonic diverticulosis. No findings of acute diverticulitis. Lymphatic: No adenopathy is seen. Reproductive: There is no prostatomegaly. Other: Small umbilical and bilateral moderate-sized inguinal fat hernias. No incarcerated hernia. There is no free air, free fluid or free hemorrhage. Musculoskeletal: There is osteopenia, moderate dextrorotary scoliosis and advanced degenerative change of the lumbar spine, ankylosis across the collapsed disc of L2-3. Ankylosis of L2-3 facets. Review of the MIP images confirms the above findings. IMPRESSION: 1. No aortic dissection or aneurysm. 2. Heavy mixed plaques without critical aortic stenosis. 3. Heavy calcific CAD with old CABG. 4. Non-flow-limiting origin stenosis of the right subclavian artery, previous left subclavian artery and left external iliac artery stenting, and occluded stents in both proximal superficial femoral arteries. 5. Aortic visceral branch arterial stenoses, as described above, greatest involving the left lower pole artery, the hypoplastic left upper pole artery origin, and the SMA. 6. 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 7. Small lung nodules again noted.  Adhere to Lung-RADS guidelines. 8. Multifocal airspace disease consistent with multilobar pneumonia. Background COPD. Follow-up to ensure clearing. 9. Interlobular septal thickening in the lung bases consistent with basilar edema or interstitial pneumonitis, with minimal pleural effusions. 10. Gastroenteritis without evidence of bowel obstruction or inflammation. 11. Diffuse diverticulosis. 12. Osteopenia and degenerative change with lumbar scoliosis. 13. Umbilical and bilateral inguinal fat hernias. Electronically  Signed   By: Telford Nab M.D.   On: 07/03/2022 07:49   CT HEAD WO CONTRAST (5MM)  Result Date: 07/03/2022 CLINICAL DATA:  Chest pain and neuro deficit. EXAM: CT HEAD WITHOUT CONTRAST TECHNIQUE: Contiguous axial images were obtained from the base of the skull through the vertex without intravenous contrast. RADIATION DOSE REDUCTION: This exam was performed according to the departmental dose-optimization program which includes automated exposure control, adjustment of the mA and/or kV according to patient size and/or use of iterative reconstruction technique. COMPARISON:  CT 02/17/2022 FINDINGS: Brain: There is mild cerebral atrophy with atrophic ventriculomegaly and mild-to-moderate small vessel disease of the cerebral white matter. Cerebellum and brainstem are unremarkable. No asymmetry is seen concerning for acute infarct, hemorrhage or mass. Basal cisterns are clear. There is no midline shift. Vascular: There are calcifications in the distal vertebral arteries  and heavily in the bilateral siphons. No hyperdense central vessel is seen. Skull: Negative for fractures or focal lesions. Sinuses/Orbits: Unremarkable orbital contents and the orbital walls. Nasal septum deviates left. There is mild membrane disease in the paranasal sinuses without fluid levels or mastoid effusion. Other: None. IMPRESSION: Chronic changes. No acute intracranial CT findings or interval changes. Carotid atherosclerosis. Electronically Signed   By: Telford Nab M.D.   On: 07/03/2022 07:01   DG Chest Portable 1 View  Result Date: 07/03/2022 CLINICAL DATA:  71 year old male status post intubation. Code STEMI. EXAM: PORTABLE CHEST 1 VIEW COMPARISON:  Chest x-ray 02/17/2022. FINDINGS: An endotracheal tube is in place with tip 6.9 cm above the carina. A nasogastric tube is seen extending into the stomach, however, the tip of the nasogastric tube extends below the lower margin of the image. Lung volumes are normal. Widespread areas of  interstitial prominence and patchy multifocal airspace consolidation noted in the lungs, most severe throughout the left mid to lower lung. No definite pleural effusions (left costophrenic sulcus is incompletely imaged). No pneumothorax. No cephalization of the pulmonary vasculature. Heart size is normal. Upper mediastinal contours are within normal limits. Atherosclerosis in the thoracic aorta. Status post median sternotomy for CABG. IMPRESSION: 1. Support apparatus, as above. 2. Multifocal interstitial and airspace disease, most severe throughout the left mid to lower lung, concerning for severe multilobar bilateral bronchopneumonia. 3. Aortic atherosclerosis. Electronically Signed   By: Vinnie Langton M.D.   On: 07/03/2022 06:30     Assessment and Plan:  Dennis Preston is a 71 y.o. male with a hx of CAD s/p CABG (LIMA to LAD, vein graft to ramus, vein graft to PDA now with occluded vein graft to obtuse marginal), chronic diastolic heart failure, aortic atherosclerosis, peripheral vascular disease s/p bilateral femoral endarterectomies/endovascular repair, paroxysmal afib, HTN, HLD, tobacco abuse, COPD, ILD, ETOH abuse who is being seen 07/04/2022 for the evaluation of recurrent VT at the request of Dr. Tacy Learn.  Cardiac arrest with ventricular tachycardia Multivessel CAD status post CABG Chronic diastolic heart failure  Patient with reported recurrent ventricular tachycardia in the setting of multilobar pneumonia. Per ED provider note, "rhythm change noted well on the CT table, heart rate increased to 150-160.  Morphology concerning for ventricular tachycardia.  Still with a strong pulse.  Given the patient remained intubated, sedated, cardioversion was attempted x2." This was not successful in modifying rhythm. Received 150 mg amiodarone bolus yesterday followed by 60 mg/hr infusion with transition to 30mg /hr infusion. Patient received a second 150 mg bolus today with concern of recurrent wide complex  rhythm. Despite cardioversion shocks x2, patient troponin reassuringly flat at 260, 261. Troponin of 53 prior to cardioversion attempts.  Review of telemetry yesterday shows what appears to be SVT with aberrancy rather than true VT. Cardioversion shocks noted without significant change in rhythm. Tele today shows sinus rhythm 80-100s with intermittent interventricular conduction delay at a similar rate, not fully LBBB. No significant axis change between patient's sinus rhythm and wide complex rhythm.  Patient is having rhythms that mimic VT rather than having true VT. If ongoing concern for wide complex rhythm , please contact cardiology for further guidance on management. Given that this is not VT, would stop Amiodarone and trial Metoprolol Tartrate 25mg  BID to see if this better controls tachyarrhythmias. Patient's arrhythmias may be secondary to hypoxia from concurrent lung pathology.  TTE today shows stable preserved LVEF without evidence of significant volume overload. BNP reassuring at 166.9. Continue HF  GDMT including Lasix, Losartan, Spironolactone as tolerated by BP.   Paroxysmal afib  Continue Eliquis (initated while inpatient at [facility] 01/2022).   COPD ILD Multilobar Pneumonia  Management per primary team     For questions or updates, please contact [place] Please consult [email or web address] for contact info under    Signed, Lily Kocher, PA-C  07/04/2022 5:00 PM  I have examined the patient and reviewed assessment and plan and discussed with patient.  Agree with above as stated.    Patient last had a cath in May 2023.  3 of 4 grafts were patent.  Graft to the circumflex was occluded.  The circumflex in general is a small vessel and there were no significant branches that left the AV groove.  Medical therapy was recommended.  He had his left subclavian stent at that time to help with flow from the LIMA to LAD.  He began vaping about 6 weeks ago.  He had severe shortness  of breath.  He has known COPD.  I suspect he became hypoxemic due to respiratory issues.  In reviewing his telemetry, it does appear he had a wide-complex rhythm but it was more likely a supraventricular tachycardia with aberrancy.  There also appeared to be a stretch of sinus rhythm with a IVCD.  It was noted from Dr. Wende Bushy note that even when he had a wide QRS complex, he had a strong pulse.    We will off amiodarone.  Start metoprolol as noted above.  Continue to treat his respiratory illness.  We talked about the importance of stopping smoking.  Avoiding respiratory issues will be very important for him and his heart health to avoid strain and demand ischemia.  Larae Grooms

## 2022-07-04 NOTE — Progress Notes (Signed)
Echocardiogram 2D Echocardiogram has been performed.  Oneal Deputy Kristyna Bradstreet RDCS 07/04/2022, 3:23 PM

## 2022-07-05 DIAGNOSIS — I472 Ventricular tachycardia, unspecified: Secondary | ICD-10-CM | POA: Diagnosis not present

## 2022-07-05 DIAGNOSIS — J9601 Acute respiratory failure with hypoxia: Secondary | ICD-10-CM | POA: Diagnosis not present

## 2022-07-05 LAB — COMPREHENSIVE METABOLIC PANEL
ALT: 16 U/L (ref 0–44)
AST: 20 U/L (ref 15–41)
Albumin: 2.9 g/dL — ABNORMAL LOW (ref 3.5–5.0)
Alkaline Phosphatase: 58 U/L (ref 38–126)
Anion gap: 9 (ref 5–15)
BUN: 23 mg/dL (ref 8–23)
CO2: 30 mmol/L (ref 22–32)
Calcium: 8.7 mg/dL — ABNORMAL LOW (ref 8.9–10.3)
Chloride: 103 mmol/L (ref 98–111)
Creatinine, Ser: 1.05 mg/dL (ref 0.61–1.24)
GFR, Estimated: >60 mL/min (ref >60)
Glucose, Bld: 113 mg/dL — ABNORMAL HIGH (ref 70–99)
Potassium: 4.5 mmol/L (ref 3.5–5.1)
Sodium: 142 mmol/L (ref 135–145)
Total Bilirubin: 0.2 mg/dL — ABNORMAL LOW (ref 0.3–1.2)
Total Protein: 5.4 g/dL — ABNORMAL LOW (ref 6.5–8.1)

## 2022-07-05 LAB — CBC WITH DIFFERENTIAL/PLATELET
Abs Immature Granulocytes: 0.05 10*3/uL (ref 0.00–0.07)
Basophils Absolute: 0 10*3/uL (ref 0.0–0.1)
Basophils Relative: 0 %
Eosinophils Absolute: 0 10*3/uL (ref 0.0–0.5)
Eosinophils Relative: 0 %
HCT: 40.6 % (ref 39.0–52.0)
Hemoglobin: 13.7 g/dL (ref 13.0–17.0)
Immature Granulocytes: 0 %
Lymphocytes Relative: 8 %
Lymphs Abs: 1.1 10*3/uL (ref 0.7–4.0)
MCH: 31.9 pg (ref 26.0–34.0)
MCHC: 33.7 g/dL (ref 30.0–36.0)
MCV: 94.4 fL (ref 80.0–100.0)
Monocytes Absolute: 1.2 10*3/uL — ABNORMAL HIGH (ref 0.1–1.0)
Monocytes Relative: 9 %
Neutro Abs: 10.7 10*3/uL — ABNORMAL HIGH (ref 1.7–7.7)
Neutrophils Relative %: 83 %
Platelets: 161 10*3/uL (ref 150–400)
RBC: 4.3 MIL/uL (ref 4.22–5.81)
RDW: 15.1 % (ref 11.5–15.5)
WBC: 13 10*3/uL — ABNORMAL HIGH (ref 4.0–10.5)
nRBC: 0 % (ref 0.0–0.2)

## 2022-07-05 LAB — CULTURE, RESPIRATORY W GRAM STAIN: Culture: NO GROWTH

## 2022-07-05 LAB — CULTURE, BAL-QUANTITATIVE W GRAM STAIN: Culture: 10000 — AB

## 2022-07-05 LAB — GLUCOSE, CAPILLARY
Glucose-Capillary: 118 mg/dL — ABNORMAL HIGH (ref 70–99)
Glucose-Capillary: 92 mg/dL (ref 70–99)
Glucose-Capillary: 98 mg/dL (ref 70–99)
Glucose-Capillary: 126 mg/dL — ABNORMAL HIGH (ref 70–99)
Glucose-Capillary: 148 mg/dL — ABNORMAL HIGH (ref 70–99)
Glucose-Capillary: 152 mg/dL — ABNORMAL HIGH (ref 70–99)

## 2022-07-05 LAB — PROCALCITONIN: Procalcitonin: 0.14 ng/mL

## 2022-07-05 MED ORDER — ATROPINE SULFATE 1 MG/10ML IJ SOSY
1.0000 mg | PREFILLED_SYRINGE | Freq: Once | INTRAMUSCULAR | Status: AC
  Administered 2022-07-05: 1 mg via INTRAVENOUS
  Filled 2022-07-05: qty 10

## 2022-07-05 MED ORDER — ATROPINE SULFATE 1 MG/10ML IJ SOSY
PREFILLED_SYRINGE | INTRAMUSCULAR | Status: AC
  Filled 2022-07-05: qty 10

## 2022-07-05 MED ORDER — IPRATROPIUM-ALBUTEROL 0.5-2.5 (3) MG/3ML IN SOLN: 3.0000 mL | RESPIRATORY_TRACT | Status: DC | PRN

## 2022-07-05 MED ORDER — METOPROLOL TARTRATE 12.5 MG HALF TABLET: 12.5000 mg | ORAL_TABLET | Freq: Two times a day (BID) | ORAL | Status: DC

## 2022-07-05 MED ORDER — ATROPINE SULFATE 1 MG/10ML IJ SOSY
1.0000 mg | PREFILLED_SYRINGE | INTRAMUSCULAR | Status: DC | PRN
  Administered 2022-07-05: 0.5 mg via INTRAVENOUS
  Administered 2022-07-05 (×2): 1 mg via INTRAVENOUS

## 2022-07-05 NOTE — Progress Notes (Signed)
Orders received for PRN atropine with parameters.

## 2022-07-05 NOTE — Progress Notes (Signed)
Patient HR is beginning to drop down in 30's frequently. Patient remains alert and  SBP remains stable. Notified E link

## 2022-07-05 NOTE — Progress Notes (Signed)
Patient HR sustained between 39-40.  Notified MD.  Orders received to give 0.5mg  of atropine.  Medication given and HR is in the 60s now.  Will continue to monitor patient.

## 2022-07-05 NOTE — Progress Notes (Signed)
RT note patient choked on eggs this morning, good cough, but still feels like something is still causing mild obstruction. O2 increased to 5L by RN, Nebulizer's given at this time as well. Continue to monitor.

## 2022-07-05 NOTE — Evaluation (Signed)
Clinical/Bedside Swallow Evaluation Patient Details  Name: EWING FANDINO MRN: [id number] Date of Birth: 12/31/1950  Today's Date: 07/05/2022 Time: SLP Start Time (ACUTE ONLY): 1117 SLP Stop Time (ACUTE ONLY): 1135 SLP Time Calculation (min) (ACUTE ONLY): 18 min  Past Medical History:  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, SVG-OM1, SVG-PDA, SVG-PL   Hyperlipemia    Hypertension    Leucocytosis    Liver dysfunction    Long term current use of clopidogrel    NSTEMI (non-ST elevated myocardial infarction) (HCC)    approx 2016   Peripheral artery disease (HCC)    legs   Tobacco abuse    Wears dentures    full upper and lower   Past Surgical History:  Past Surgical History:  Procedure Laterality Date   APPLICATION OF WOUND VAC Right 06/22/2020   Procedure: APPLICATION OF WOUND VAC;  Surgeon: Annice Needy, MD;  Location: [facility] ORS;  Service: Vascular;  Laterality: Right;   CARDIAC CATHETERIZATION N/A 07/10/2015   Procedure: Right/Left Heart Cath and Coronary Angiography;  Surgeon: Alwyn Pea, MD;  Location: [facility] INVASIVE CV LAB;  Service: Cardiovascular;  Laterality: N/A;   CORONARY ARTERY BYPASS GRAFT  07/13/2015   [facility].  4 vessel   ENDARTERECTOMY Right 01/02/2021   Procedure: ENDARTERECTOMY CAROTID;  Surgeon: Annice Needy, MD;  Location: [facility] ORS;  Service: Vascular;  Laterality: Right;   ENDARTERECTOMY FEMORAL Bilateral 05/23/2020   Procedure: ENDARTERECTOMY FEMORAL BILATERAL SUPERFICIAL FEMORAL ARTERY STENTS ;  Surgeon: Renford Dills, MD;  Location: [facility] ORS;  Service: Vascular;  Laterality: Bilateral;   FALSE ANEURYSM REPAIR Left 07/22/2020   Procedure:  REPAIR LEFT FEMORAL PSEUDOANUERYSM;  Surgeon: Bertram Denver, MD;  Location: [facility] ORS;  Service: Vascular;  Laterality: Left;   INSERTION OF ILIAC STENT Left 05/23/2020   Procedure: INSERTION OF ILIAC STENT;  Surgeon: Renford Dills, MD;  Location: [facility] ORS;  Service: Vascular;  Laterality: Left;   LEFT HEART CATH AND CORS/GRAFTS ANGIOGRAPHY N/A 01/23/2020   Procedure: LEFT HEART CATH AND CORS/GRAFTS ANGIOGRAPHY;  Surgeon: Alwyn Pea, MD;  Location: [facility] INVASIVE CV LAB;  Service: Cardiovascular;  Laterality: N/A;   LEFT HEART CATH AND CORS/GRAFTS ANGIOGRAPHY N/A 02/18/2022   Procedure: LEFT HEART CATH AND CORS/GRAFTS ANGIOGRAPHY;  Surgeon: Armando Reichert, MD;  Location[facility] INVASIVE CV LAB;  Service: Cardiovascular;  Laterality: N/A;   LOWER EXTREMITY ANGIOGRAPHY Right 04/03/2020   Procedure: LOWER EXTREMITY ANGIOGRAPHY;  Surgeon: Renford Dills, MD;  Location: [facility] INVASIVE CV LAB;  Service: Cardiovascular;  Laterality: Right;   OLECRANON BURSECTOMY Left 05/06/2019   Procedure: OLECRANON BURSECTOMY AND DEBRIDEMENT;  Surgeon: Signa Kell, MD;  Location: [facility] ORS;  Service: Orthopedics;  Laterality: Left;   WOUND DEBRIDEMENT Right 06/22/2020   Procedure: DEBRIDEMENT WOUND RIGHT GROIN;  Surgeon: Annice Needy, MD;  Location: [facility] ORS;  Service: Vascular;  Laterality: Right;   HPI:  71 yo male admitted 10/5 with SOB and pulseless VT's, requiring defibrillation x 2. Intubated 10/5-10/6. PMhx: COPD, AFib, PAD, CABG, CHF, HTN, HLD, Rt CEA. Pt passed [facility] screen after extubation and started  on diet.  Had explosive and prolonged coughing episode on 10/7 while eating scrambled eggs. Reports he "chokes" on solid foods like hamburgers at least 2x/week.    Assessment / Plan / Recommendation  Clinical Impression  Mr.Kendra participated in clinical swallowing assessment. Before evaluation was started, he sipped water from a straw, eliciting immediate coughing that persisted for several minutes.   After he ceased coughing, we began formal assessment. Oral mechanism exam was normal with no focal deficits.  He consumed applesauce, crackers, and multiple/sequential sips of water with no further coughing, no s/s of dysphagia. RR was well within parameters that allow synchrony of swallowing /breathing.  Etiology of his choking episodes unknown. D/W pt the benefit of slowing down, focusing his attention on eating, "warming up" by eating a few ice chips before he begins a meal. SLP will follow briefly to determine if instrumental assessment would be beneficial. SLP Visit Diagnosis: Dysphagia, unspecified (R13.10)    Aspiration Risk    unknown   Diet Recommendation   Regular solids, thin liquids   Medication Administration: Whole meds with liquid    Other  Recommendations Oral Care Recommendations: Oral care BID    Recommendations for follow up therapy are one component of a multi-disciplinary discharge planning process, led by the attending physician.  Recommendations may be updated based on patient status, additional functional criteria and insurance authorization.  Follow up Recommendations No SLP follow up              Frequency and Duration min 2x/week  1 week               Swallow Study   General Date of Onset: 07/03/22 HPI: 71 yo male admitted 10/5 with SOB and pulseless VT's, requiring defibrillation x 2. Intubated 10/5-10/6. PMhx: COPD, AFib, PAD, CABG, CHF, HTN, HLD, Rt CEA. Pt passed [facility] screen after extubation and started on diet.  Had explosive and prolonged coughing episode on 10/7 while eating scrambled eggs. Reports he "chokes" on solid foods like hamburgers at least 2x/week. Type of Study: Bedside Swallow Evaluation Previous Swallow Assessment: 01/2022 normal findings Diet Prior to this Study: Regular;Thin liquids Temperature Spikes Noted: No Respiratory Status: Nasal cannula History of Recent Intubation: Yes Length of Intubations (days): 1 days Date extubated:  07/04/22 Behavior/Cognition: Alert;Cooperative Oral Cavity Assessment: Within Functional Limits Oral Care Completed by SLP: Recent completion by staff Oral Cavity - Dentition: Dentures, top;Dentures, bottom Vision: Functional for self-feeding Self-Feeding Abilities: Able to feed self Patient Positioning: Upright in chair Baseline Vocal Quality: Normal Volitional Cough: Strong Volitional Swallow: Able to elicit    Oral/Motor/Sensory Function Overall Oral Motor/Sensory Function: Within functional limits   Ice Chips Ice chips: Within functional limits   Thin Liquid Thin Liquid: Within functional limits    Nectar Thick Nectar Thick Liquid: Not tested   Honey Thick Honey Thick Liquid: Not tested   Puree Puree: Within functional limits   Solid     Solid: Within functional limits      Juan Quam Laurice 07/05/2022,11:48 AM  Estill Bamberg L. Tivis Ringer, MA CCC/SLP Clinical Specialist - [place] Office number [phone]

## 2022-07-05 NOTE — Progress Notes (Addendum)
[facility] Progress Note Patient Name: Dennis Preston DOB: May 31, 1951 MRN: [id number]   Date of Service  07/05/2022  HPI/Events of Note  Sinus bradycardia - Nursing reports HR = 40's to 50's post Metoprolol 25 mg PO dose. One episode of HR in 30's very briefly. BP = 127/50 with MAP = 75. Metoprolol dose is scheduled for 10 AM.  eICU Interventions  Plan: Decrease Metoprolol to 12.5 mg PO BID. Atropine 1 mg to bedside. Give 0.5 mg IV PRN for HR < 40 or HR < 60 and SBO < 100.     Intervention Category Major Interventions: Arrhythmia - evaluation and management  Marcello Tuzzolino [place] 07/05/2022, 12:19 AM

## 2022-07-05 NOTE — Progress Notes (Signed)
NAME:  Dennis Preston, MRN:  [id number], DOB:  03-06-1951, LOS: 2 ADMISSION DATE:  07/03/2022, CONSULTATION DATE:  07/03/22 REFERRING MD:  Mertha Finders, CHIEF COMPLAINT:  hypoxia   History of Present Illness:  Dennis Preston is a 71 y/o gentleman who presented 10/5 with acute respiratory distress and SOB.   His wife reports he has been more short of breath over the last 3 days.  He has been vaping since April of this year.  He has been using his oxygen intermittently in the last few days and has had decreased activity tolerance.  With EMS he had saturations 72% on RA at home. Code STEMI was activated by EMS and he was given aspirin, NTG, duoneb, solumedrol. He had 2 runs of pulseless VT with EMS.  Upon arrival the ED he was evaluated by cardiology, who felt this was more likely repolarization abnormality, less likely a STEMI. He had a recent LHC with open grafts from his CABG other than occlusion of vein graft to obtuse marginal.  Due to worsening mental status and hypoxia he was intubated. CT scan to evaluate for aortic dissection was negative for dissection but demonstrated bilateral airspace disease with multilobar pneumonia and findings consistent with interstitial pneumonitis. During CT scan he developed VT again requiring defibrillation. PCCM consulted for management.  Pertinent  Medical History  CAD s/p CABG HFrEF Tobacco abuse HTN HLD Afib ETOH abuse  Significant Hospital Events: Including procedures, antibiotic start and stop dates in addition to other pertinent events   10/5 Admitted, intubated, developed VT requiring defibrillation 10/6 Extubated, cardiology consulted.  Amiodarone stopped and started on metoprolol  Interim History / Subjective:   Had an episode of choking while eating eggs today.  On slightly higher FiO2 today morning.  Objective   Blood pressure (!) 128/117, pulse 74, temperature 98.1 F (36.7 C), temperature source Oral, resp. rate (!) 24, height 5\' 7"  (1.702 m),  weight 77.9 kg, SpO2 93 %.        Intake/Output Summary (Last 24 hours) at 07/05/2022 1008 Last data filed at 07/05/2022 0800 Gross per 24 hour  Intake 783.92 ml  Output 1115 ml  Net -331.08 ml   Filed Weights   07/03/22 0647 07/03/22 1700 07/05/22 0500  Weight: 77.1 kg 77.1 kg 77.9 kg    Examination: Gen:      No acute distress, sitting up in chair at bedside HEENT:  EOMI, sclera anicteric Neck:     No masses; no thyromegaly Lungs:   Rhonchorous breath sounds CV:         Regular rate and rhythm; no murmurs Abd:      + bowel sounds; soft, non-tender; no palpable masses, no distension Ext:    No edema; adequate peripheral perfusion Skin:      Warm and dry; no rash Neuro: alert and oriented x 3  Labs/imaging reviewed Significant for BUN/creatinine 23/1.05, WBC 13, hemoglobin 13.7, platelets 161 No new imaging  Resolved Hospital Problem list     Assessment & Plan:   Acute on Chronic Hypoxic / Hypercarbic Respiratory Failure LUL Pulmonary Infiltrates / Multilobar Pneumonia Mild Acute Respiratory Acidosis  Hx of COPD Tobacco/Vape Use Differential dx includes AECOPD, interstitial pneumonitis / inflammatory process, ILD, bacterial PNA / aspiration.  Ongoing tobacco use via vaping.  -brovana, pulmicort, yupelri  -Finished 3 days of azithromycin.  Continue ceftriaxone for 7 days -Solumedrol D 2, change to prednisone when tolerating PO -Respiratory pathogen panel negative -nicotine patch  -Wean down oxygen  Ventricular Tachycardia  s/p Defibrillation x2 - Appreciate Cardiology evaluation.  Abnormal rhythm was felt to be SVT with aberration and not true ventricular tachycardia - No indication for LHC at this time  - Continue beta-blockers as tolerated.  HFrEF CAD s/p CABG HTN -continue home plavix -On Eliquis -hold home lasix today  Pre-Diabetes SSI coverage  At Risk Malnutrition Speech eval as he has aspiration risk  Paroxsymal Atrial Fibrillation On  Eliquis  HLD -continue home lipitor   Alcohol Abuse -unclear timing / if still using, will discuss with patient  Best Practice (right click and "Reselect all SmartList Selections" daily)  Diet/type: NPO w/ oral meds DVT prophylaxis: Eliquis GI prophylaxis: PPI Lines: N/A Foley:  N/A Code Status:  full code Last date of multidisciplinary goals of care discussion:  10/6 Wife updated at bedside.  Patient updated 10/7  Critical care time:    The patient is critically ill with multiple organ system failure and requires high complexity decision making for assessment and support, frequent evaluation and titration of therapies, advanced monitoring, review of radiographic studies and interpretation of complex data.   Critical Care Time devoted to patient care services, exclusive of separately billable procedures, described in this note is 35 minutes.   Marshell Garfinkel MD [facility] See Amion for pager  If no response to pager , please call [phone] until 7pm After 7:00 pm call Elink  [phone] 07/05/2022, 10:09 AM

## 2022-07-05 NOTE — Progress Notes (Signed)
Atropine 0.5mg  given for HR of 39. Patient alert and BP WNL

## 2022-07-05 NOTE — Progress Notes (Addendum)
Patient HR down to 39  non-sustained. Notified E-link. Patient is alert and blood pressures  are WNL.

## 2022-07-05 NOTE — Evaluation (Signed)
Physical Therapy Evaluation Patient Details Name: Dennis Preston MRN: [id number] DOB: 12-Aug-1951 Today's Date: 07/05/2022  History of Present Illness  71 yo male admitted 10/5 with SOB and pulseless VT's, requiring defibrillation x 2. Intubated 10/5-10/6. PMhx: COPD, AFib, PAD, CABG, CHF, HTN, HLD, Rt CEA  Clinical Impression  Pt pleasant with frequent coughing after pt reportedly choked on his eggs this am. Pt with desaturation to 84% on 4L with coughing and requires increased time and cue for breathing technique to recover to 93%. Pt with decreased strength, functional mobility and gait who will benefit from acute therapy to maximize mobility and safety. Anticipate pt will progress quickly once coughing no longer limiting pulmonary function.        Recommendations for follow up therapy are one component of a multi-disciplinary discharge planning process, led by the attending physician.  Recommendations may be updated based on patient status, additional functional criteria and insurance authorization.  Follow Up Recommendations No PT follow up      Assistance Recommended at Discharge Intermittent Supervision/Assistance  Patient can return home with the following  A little help with walking and/or transfers;Assistance with cooking/housework;Assist for transportation;Help with stairs or ramp for entrance;A little help with bathing/dressing/bathroom    Equipment Recommendations None recommended by PT  Recommendations for Other Services       Functional Status Assessment Patient has had a recent decline in their functional status and demonstrates the ability to make significant improvements in function in a reasonable and predictable amount of time.     Precautions / Restrictions Precautions Precautions: Fall;Other (comment) Precaution Comments: watch sats Restrictions Weight Bearing Restrictions: No      Mobility  Bed Mobility Overal bed mobility: Needs Assistance Bed Mobility:  Supine to Sit     Supine to sit: HOB elevated     General bed mobility comments: HOB 35 degrees with min assist to fully elevate trunk and pivot to EOB    Transfers Overall transfer level: Needs assistance   Transfers: Sit to/from Stand Sit to Stand: Min guard           General transfer comment: cues for hand placement with guarding for safety    Ambulation/Gait Ambulation/Gait assistance: Min guard Gait Distance (Feet): 150 Feet Assistive device: Rolling walker (2 wheels) Gait Pattern/deviations: Step-through pattern, Decreased stride length   Gait velocity interpretation: >2.62 ft/sec, indicative of community ambulatory   General Gait Details: pt required 6L for gait to maintain sats >92%, standing rest of grossly 3 min after initial 15' due to coughing  Stairs            Wheelchair Mobility    Modified Rankin (Stroke Patients Only)       Balance Overall balance assessment: Needs assistance   Sitting balance-Leahy Scale: Good     Standing balance support: Bilateral upper extremity supported Standing balance-Leahy Scale: Fair Standing balance comment: Rw for gait, able to stand without support briefly                             Pertinent Vitals/Pain Pain Assessment Pain Assessment: No/denies pain    Home Living Family/patient expects to be discharged to:: Private residence Living Arrangements: Spouse/significant other Available Help at Discharge: Family;Available 24 hours/day Type of Home: Mobile home Home Access: Stairs to enter Entrance Stairs-Rails: Right;Left Entrance Stairs-Number of Steps: 4   Home Layout: One level Home Equipment: Conservation officer, nature (2 wheels);Cane - single point;Wheelchair - power;Shower seat Additional  Comments: sleeps in a recliner    Prior Function Prior Level of Function : Independent/Modified Independent             Mobility Comments: pt normally independent, walks without AD, riding mows his  yard       Hand Dominance        Extremity/Trunk Assessment   Upper Extremity Assessment Upper Extremity Assessment: Overall WFL for tasks assessed    Lower Extremity Assessment Lower Extremity Assessment: Generalized weakness    Cervical / Trunk Assessment Cervical / Trunk Assessment: Normal  Communication   Communication: No difficulties  Cognition Arousal/Alertness: Awake/alert Behavior During Therapy: WFL for tasks assessed/performed Overall Cognitive Status: Within Functional Limits for tasks assessed                                          General Comments      Exercises     Assessment/Plan    PT Assessment Patient needs continued PT services  PT Problem List Decreased mobility;Decreased activity tolerance;Cardiopulmonary status limiting activity;Decreased balance;Decreased knowledge of use of DME       PT Treatment Interventions Gait training;Stair training;Functional mobility training;Therapeutic activities;Patient/family education;Therapeutic exercise;DME instruction    PT Goals (Current goals can be found in the Care Plan section)  Acute Rehab PT Goals Patient Stated Goal: return home PT Goal Formulation: With patient Time For Goal Achievement: 07/19/22 Potential to Achieve Goals: Good    Frequency Min 3X/week     Co-evaluation               AM-PAC PT "6 Clicks" Mobility  Outcome Measure Help needed turning from your back to your side while in a flat bed without using bedrails?: A Little Help needed moving from lying on your back to sitting on the side of a flat bed without using bedrails?: A Little Help needed moving to and from a bed to a chair (including a wheelchair)?: A Little Help needed standing up from a chair using your arms (e.g., wheelchair or bedside chair)?: A Little Help needed to walk in hospital room?: A Little Help needed climbing 3-5 steps with a railing? : A Lot 6 Click Score: 17    End of Session  Equipment Utilized During Treatment: Gait belt;Oxygen Activity Tolerance: Patient tolerated treatment well Patient left: in chair;with call bell/phone within reach;with nursing/sitter in room Nurse Communication: Mobility status PT Visit Diagnosis: Other abnormalities of gait and mobility (R26.89);Difficulty in walking, not elsewhere classified (R26.2)    Time: 8333-8329 PT Time Calculation (min) (ACUTE ONLY): 31 min   Charges:   PT Evaluation $PT Eval Moderate Complexity: 1 Mod PT Treatments $Therapeutic Activity: 8-22 mins        Bayard Males, PT Acute Rehabilitation Services Office: [phone]   Shelba Susi B Jeffrey Graefe 07/05/2022, 10:12 AM

## 2022-07-05 NOTE — Progress Notes (Signed)
Rounding Note    Patient Name: Dennis Preston Date of Encounter: 07/05/2022  [place] Cardiologist: None [facility] cardiology  Subjective   Agitated earlier.  Choked on breakfast. Comfortable in chair.   Inpatient Medications    Scheduled Meds:  apixaban  5 mg Oral BID   arformoterol  15 mcg Nebulization BID   atorvastatin  80 mg Oral q1800   budesonide (PULMICORT) nebulizer solution  0.5 mg Nebulization BID   clopidogrel  75 mg Oral QPM   insulin aspart  0-9 Units Subcutaneous Q4H   methylPREDNISolone (SOLU-MEDROL) injection  40 mg Intravenous Daily   metoprolol tartrate  12.5 mg Oral BID   nicotine  21 mg Transdermal Daily   revefenacin  175 mcg Nebulization Daily   Continuous Infusions:  sodium chloride Stopped (07/05/22 0913)   cefTRIAXone (ROCEPHIN)  IV Stopped (07/05/22 0605)   PRN Meds: sodium chloride, acetaminophen, atropine, docusate, ipratropium-albuterol, ondansetron (ZOFRAN) IV, polyethylene glycol   Vital Signs    Vitals:   07/05/22 0741 07/05/22 0800 07/05/22 0900 07/05/22 1000  BP:  (!) 182/164 (!) 152/65 (!) 128/117  Pulse:  (!) 112 (!) 104 74  Resp:  (!) 26 (!) 26 (!) 24  Temp:  98.1 F (36.7 C)    TempSrc:  Oral    SpO2: 94% (!) 89% 92% 93%  Weight:      Height:        Intake/Output Summary (Last 24 hours) at 07/05/2022 1052 Last data filed at 07/05/2022 1000 Gross per 24 hour  Intake 1008.93 ml  Output 1115 ml  Net -106.07 ml      07/05/2022    5:00 AM 07/03/2022    5:00 PM 07/03/2022    6:47 AM  Last 3 Weights  Weight (lbs) 171 lb 11.8 oz 169 lb 15.6 oz 170 lb  Weight (kg) 77.9 kg 77.1 kg 77.111 kg      Telemetry    SB 40-50's Prior PAT with WCT - Personally Reviewed  ECG    Several EKGs reviewed, left bundle branch block like appearance wide-complex.- Personally Reviewed  Physical Exam   GEN: No acute distress.  Sleeping in chair.  Neck: No JVD Cardiac: Loletha Grayer reg, no murmurs, rubs, or gallops.   Respiratory: Clear to auscultation bilaterally. GI: Soft, nontender, non-distended  MS: No edema; No deformity. Neuro:  Nonfocal  Psych: Normal affect   Labs    High Sensitivity Troponin:   Recent Labs  Lab 07/03/22 0612 07/03/22 0937 07/04/22 1350 07/04/22 1615  TROPONINIHS 53* 260* 261* 230*     Chemistry Recent Labs  Lab 07/03/22 0612 07/03/22 0621 07/03/22 0827 07/04/22 0540 07/04/22 1615 07/05/22 0317  NA 137 137 131* 139  --  142  K 4.8 5.1 5.5* 4.4  --  4.5  CL 100 101  --  100  --  103  CO2 27  --   --  27  --  30  GLUCOSE 216* 208*  --  151*  --  113*  BUN 12 18  --  33*  --  23  CREATININE 1.33* 1.20  --  1.32*  --  1.05  CALCIUM 8.9  --   --  8.9  --  8.7*  MG  --   --   --  2.2 2.3  --   PROT 6.6  --   --   --   --  5.4*  ALBUMIN 4.1  --   --   --   --  2.9*  AST 29  --   --   --   --  20  ALT 21  --   --   --   --  16  ALKPHOS 87  --   --   --   --  58  BILITOT 0.5  --   --   --   --  0.2*  GFRNONAA 58*  --   --  58*  --  >60  ANIONGAP 10  --   --  12  --  9    Lipids  Recent Labs  Lab 07/04/22 0540  TRIG 83    Hematology Recent Labs  Lab 07/03/22 0612 07/03/22 0621 07/03/22 0827 07/04/22 0540 07/05/22 0317  WBC 19.6*  18.1*  --   --  17.1* 13.0*  RBC 5.24  5.72  --   --  4.45 4.30  HGB 16.1  18.3*   < > 17.3* 14.2 13.7  HCT 51.4  57.0*   < > 51.0 41.1 40.6  MCV 98.1  99.7  --   --  92.4 94.4  MCH 30.7  32.0  --   --  31.9 31.9  MCHC 31.3  32.1  --   --  34.5 33.7  RDW 14.5  14.5  --   --  14.6 15.1  PLT 224  243  --   --  169 161   < > = values in this interval not displayed.   Thyroid No results for input(s): "TSH", "FREET4" in the last 168 hours.  BNP Recent Labs  Lab 07/03/22 0612  BNP 166.9*    DDimer No results for input(s): "DDIMER" in the last 168 hours.   Radiology    ECHOCARDIOGRAM COMPLETE  Result Date: 07/04/2022    ECHOCARDIOGRAM REPORT   Patient Name:   Dennis Preston Date of Exam: 07/04/2022  Medical Rec #:  [id number]       Height:       67.0 in Accession #:    [id number]      Weight:       170.0 lb Date of Birth:  1951-04-25      BSA:          1.887 m Patient Age:    71 years        BP:           98/51 mmHg Patient Gender: M               HR:           91 bpm. Exam Location:  Inpatient Procedure: 2D Echo, Color Doppler and Cardiac Doppler Indications:    Acute Ischemic Heart Disease i24.9  History:        Patient has prior history of Echocardiogram examinations, most                 recent 02/17/2022. CAD, Prior CABG, COPD, Arrythmias:Atrial                 Fibrillation; Risk Factors:Hypertension and Dyslipidemia.  Sonographer:    Raquel Sarna Senior RDCS Referring Phys: [id number] Lily Kocher  Sonographer Comments: Technically difficult due to lung interference from COPD IMPRESSIONS  1. Left ventricular ejection fraction, by estimation, is 55 to 60%. The left ventricle has normal function. The left ventricle has no regional wall motion abnormalities. Left ventricular diastolic parameters are consistent with Grade I diastolic dysfunction (impaired relaxation).  2. Right ventricular systolic function is normal. The right ventricular size is  normal. Tricuspid regurgitation signal is inadequate for assessing PA pressure.  3. The mitral valve is normal in structure. Trivial mitral valve regurgitation.  4. The aortic valve is tricuspid. There is mild calcification of the aortic valve. There is mild thickening of the aortic valve. Aortic valve regurgitation is not visualized. Aortic valve sclerosis/calcification is present, without any evidence of aortic stenosis.  5. The inferior vena cava is normal in size with greater than 50% respiratory variability, suggesting right atrial pressure of 3 mmHg. Comparison(s): No significant change from prior study. FINDINGS  Left Ventricle: Left ventricular ejection fraction, by estimation, is 55 to 60%. The left ventricle has normal function. The left ventricle has no regional  wall motion abnormalities. The left ventricular internal cavity size was normal in size. There is  no left ventricular hypertrophy. Left ventricular diastolic parameters are consistent with Grade I diastolic dysfunction (impaired relaxation). Right Ventricle: The right ventricular size is normal. No increase in right ventricular wall thickness. Right ventricular systolic function is normal. Tricuspid regurgitation signal is inadequate for assessing PA pressure. Left Atrium: Left atrial size was normal in size. Right Atrium: Right atrial size was normal in size. Pericardium: There is no evidence of pericardial effusion. Mitral Valve: The mitral valve is normal in structure. Trivial mitral valve regurgitation. Tricuspid Valve: The tricuspid valve is normal in structure. Tricuspid valve regurgitation is trivial. Aortic Valve: The aortic valve is tricuspid. There is mild calcification of the aortic valve. There is mild thickening of the aortic valve. Aortic valve regurgitation is not visualized. Aortic valve sclerosis/calcification is present, without any evidence of aortic stenosis. Pulmonic Valve: The pulmonic valve was normal in structure. Pulmonic valve regurgitation is trivial. Aorta: The aortic root is normal in size and structure. Venous: The inferior vena cava is normal in size with greater than 50% respiratory variability, suggesting right atrial pressure of 3 mmHg. IAS/Shunts: The atrial septum is grossly normal.  LEFT VENTRICLE PLAX 2D LVIDd:         4.90 cm   Diastology LVIDs:         3.00 cm   LV e' medial:    7.51 cm/s LV PW:         1.00 cm   LV E/e' medial:  11.9 LV IVS:        0.70 cm   LV e' lateral:   12.20 cm/s LVOT diam:     2.20 cm   LV E/e' lateral: 7.3 LV SV:         78 LV SV Index:   41 LVOT Area:     3.80 cm  RIGHT VENTRICLE TAPSE (M-mode): 1.4 cm LEFT ATRIUM             Index        RIGHT ATRIUM           Index LA diam:        3.10 cm 1.64 cm/m   RA Area:     13.80 cm LA Vol (A2C):   48.1  ml 25.49 ml/m  RA Volume:   31.30 ml  16.59 ml/m LA Vol (A4C):   23.6 ml 12.51 ml/m LA Biplane Vol: 37.1 ml 19.66 ml/m  AORTIC VALVE LVOT Vmax:   109.00 cm/s LVOT Vmean:  69.500 cm/s LVOT VTI:    0.204 m  AORTA Ao Root diam: 3.00 cm MITRAL VALVE MV Area (PHT): 3.83 cm     SHUNTS MV Decel Time: 198 msec     Systemic VTI:  0.20 m MV E velocity: 89.10 cm/s   Systemic Diam: 2.20 cm MV A velocity: 133.00 cm/s MV E/A ratio:  0.67 Gwyndolyn Kaufman MD Electronically signed by Gwyndolyn Kaufman MD Signature Date/Time: 07/04/2022/3:33:43 PM    Final    DG Chest Port 1 View  Result Date: 07/04/2022 CLINICAL DATA:  Hypoxic acute ventilator dependent respiratory failure. EXAM: PORTABLE CHEST 1 VIEW COMPARISON:  Chest CT yesterday at 6:37 a.m. Portable chest yesterday at 6:24 a.m. FINDINGS: 5:01 a.m. ETT tip is 4 cm from the carina. NGT is within the stomach with the side hole and tip not filmed. A left subclavian artery stent is again noted as well as CABG change. Interstitial and patchy hazy infiltrates of left-greater-than-right lung fields are again noted, more widespread and confluent on the left. The left lung opacities are less dense today showing improvement. There is no new or worsening lung opacity. Trace pleural effusions are similar. The cardiac size is normal. There is aortic atherosclerosis with stable mediastinum. Background emphysematous disease. Osteopenia and degenerative change. IMPRESSION: Left-greater-than-right bilateral airspace disease, on the left mildly improved, otherwise unchanged. Electronically Signed   By: Telford Nab M.D.   On: 07/04/2022 06:26   DG Abd 1 View  Result Date: 07/03/2022 CLINICAL DATA:  NG tube check EXAM: ABDOMEN - 1 VIEW COMPARISON:  CT 07/03/2022 FINDINGS: NG tube extends into stomach. The tube is looped once in the fundus with tip extending to the gastric antrum. IMPRESSION: NG tube with tip in the distal stomach. Electronically Signed   By: Suzy Bouchard M.D.    On: 07/03/2022 11:15    Cardiac Studies   CABG (LIMA to LAD, vein graft to ramus, vein graft to PDA now with occluded vein graft to obtuse marginal  Patient Profile     71 y.o. male with coronary disease status post CABG, paroxysmal atrial fibrillation with wide-complex tachycardia felt to be SVT, not VT with acute respiratory failure  Assessment & Plan    Wide-complex tachycardia - Most likely SVT with widening of QRS compared to baseline.  Same axis. -Amiodarone was stopped. -Beta-blockers as tolerated.  Decrease in metoprolol from 25 twice daily to 12.5 twice daily because of bradycardia last night. This morning metoprolol was held due to decreased HR again.  --SVT once again noted around 730am brief.   Coronary artery disease status post CABG - Grafts reviewed.  Agree, no urgent need for left heart catheterization.  Do not believe that this was ventricular tachycardia. -Ejection fraction normal 55-60  Paroxysmal atrial fibrillation - On home Eliquis.  He is also on Plavix.  Watch for any signs of bleeding.  Followed at [facility] cardiology.     For questions or updates, please contact [place] Please consult [email or web address] for contact info under        Signed, Candee Furbish, MD  07/05/2022, 10:52 AM

## 2022-07-06 DIAGNOSIS — I472 Ventricular tachycardia, unspecified: Secondary | ICD-10-CM | POA: Diagnosis not present

## 2022-07-06 DIAGNOSIS — R079 Chest pain, unspecified: Secondary | ICD-10-CM | POA: Diagnosis not present

## 2022-07-06 DIAGNOSIS — J9601 Acute respiratory failure with hypoxia: Secondary | ICD-10-CM | POA: Diagnosis not present

## 2022-07-06 LAB — CBC
HCT: 46.7 % (ref 39.0–52.0)
Hemoglobin: 15.5 g/dL (ref 13.0–17.0)
MCH: 31.7 pg (ref 26.0–34.0)
MCHC: 33.2 g/dL (ref 30.0–36.0)
MCV: 95.5 fL (ref 80.0–100.0)
Platelets: 149 10*3/uL — ABNORMAL LOW (ref 150–400)
RBC: 4.89 MIL/uL (ref 4.22–5.81)
RDW: 14.7 % (ref 11.5–15.5)
WBC: 12.5 10*3/uL — ABNORMAL HIGH (ref 4.0–10.5)
nRBC: 0 % (ref 0.0–0.2)

## 2022-07-06 LAB — COMPREHENSIVE METABOLIC PANEL
ALT: 25 U/L (ref 0–44)
AST: 28 U/L (ref 15–41)
Albumin: 3.3 g/dL — ABNORMAL LOW (ref 3.5–5.0)
Alkaline Phosphatase: 59 U/L (ref 38–126)
Anion gap: 13 (ref 5–15)
BUN: 20 mg/dL (ref 8–23)
CO2: 23 mmol/L (ref 22–32)
Calcium: 8.9 mg/dL (ref 8.9–10.3)
Chloride: 105 mmol/L (ref 98–111)
Creatinine, Ser: 0.85 mg/dL (ref 0.61–1.24)
GFR, Estimated: >60 mL/min (ref >60)
Glucose, Bld: 101 mg/dL — ABNORMAL HIGH (ref 70–99)
Potassium: 5.1 mmol/L (ref 3.5–5.1)
Sodium: 141 mmol/L (ref 135–145)
Total Bilirubin: 0.4 mg/dL (ref 0.3–1.2)
Total Protein: 5.7 g/dL — ABNORMAL LOW (ref 6.5–8.1)

## 2022-07-06 LAB — GLUCOSE, CAPILLARY
Glucose-Capillary: 101 mg/dL — ABNORMAL HIGH (ref 70–99)
Glucose-Capillary: 99 mg/dL (ref 70–99)
Glucose-Capillary: 109 mg/dL — ABNORMAL HIGH (ref 70–99)
Glucose-Capillary: 249 mg/dL — ABNORMAL HIGH (ref 70–99)
Glucose-Capillary: 134 mg/dL — ABNORMAL HIGH (ref 70–99)

## 2022-07-06 MED ORDER — PREDNISONE 20 MG PO TABS
40.0000 mg | ORAL_TABLET | Freq: Every day | ORAL | Status: DC
  Administered 2022-07-06 – 2022-07-10 (×5): 40 mg via ORAL
  Filled 2022-07-06 (×5): qty 2

## 2022-07-06 NOTE — Progress Notes (Signed)
NAME:  Dennis Preston, MRN:  [id number], DOB:  November 20, 1950, LOS: 3 ADMISSION DATE:  07/03/2022, CONSULTATION DATE:  07/03/22 REFERRING MD:  Mertha Finders, CHIEF COMPLAINT:  hypoxia   History of Present Illness:  Dennis Preston is a 71 y/o gentleman who presented 10/5 with acute respiratory distress and SOB.   His wife reports he has been more short of breath over the last 3 days.  He has been vaping since April of this year.  He has been using his oxygen intermittently in the last few days and has had decreased activity tolerance.  With EMS he had saturations 72% on RA at home. Code STEMI was activated by EMS and he was given aspirin, NTG, duoneb, solumedrol. He had 2 runs of pulseless VT with EMS.  Upon arrival the ED he was evaluated by cardiology, who felt this was more likely repolarization abnormality, less likely a STEMI. He had a recent LHC with open grafts from his CABG other than occlusion of vein graft to obtuse marginal.  Due to worsening mental status and hypoxia he was intubated. CT scan to evaluate for aortic dissection was negative for dissection but demonstrated bilateral airspace disease with multilobar pneumonia and findings consistent with interstitial pneumonitis. During CT scan he developed VT again requiring defibrillation. PCCM consulted for management.  Pertinent  Medical History  CAD s/p CABG HFrEF Tobacco abuse HTN HLD Afib ETOH abuse  Significant Hospital Events: Including procedures, antibiotic start and stop dates in addition to other pertinent events   10/5 Admitted, intubated, developed VT requiring defibrillation 10/6 Extubated, cardiology consulted.  Amiodarone stopped and started on metoprolol  Interim History / Subjective:   Metoprolol held yesterday as he had episodes of bradycardia.  Required atropine IV More stable today.  Sitting in the chair at bedside with no complaints Seen by speech pathology and cleared to have p.o. diet.  Objective   Blood pressure  (!) 151/60, pulse 69, temperature 97.9 F (36.6 C), temperature source Oral, resp. rate 18, height 5\' 7"  (1.702 m), weight 77.9 kg, SpO2 95 %.        Intake/Output Summary (Last 24 hours) at 07/06/2022 1020 Last data filed at 07/06/2022 0800 Gross per 24 hour  Intake 340 ml  Output 1550 ml  Net -1210 ml   Filed Weights   07/03/22 0647 07/03/22 1700 07/05/22 0500  Weight: 77.1 kg 77.1 kg 77.9 kg    Examination: Gen:      No acute distress HEENT:  EOMI, sclera anicteric Neck:     No masses; no thyromegaly Lungs:    Clear to auscultation bilaterally; normal respiratory effort CV:         Regular rate and rhythm; no murmurs Abd:      + bowel sounds; soft, non-tender; no palpable masses, no distension Ext:    No edema; adequate peripheral perfusion Skin:      Warm and dry; no rash Neuro: alert and oriented x 3 Psych: normal mood and affect   Labs/imaging reviewed Significant for glucose 101, BUN/creatinine 20/0.85 WBC 12.5 No new imaging  Resolved Hospital Problem list     Assessment & Plan:  Acute on Chronic Hypoxic / Hypercarbic Respiratory Failure LUL Pulmonary Infiltrates / Multilobar Pneumonia Mild Acute Respiratory Acidosis  Hx of COPD Tobacco/Vape Use Differential dx includes AECOPD, interstitial pneumonitis / inflammatory process, ILD, bacterial PNA / aspiration.  Ongoing tobacco use via vaping.  Continue brovana, pulmicort, yupelri  Finished 3 days of azithromycin.  Continue ceftriaxone for 7 days  On Prednisone 40 mg.  Can taper over the next week. Respiratory pathogen panel negative Nicotine patch  Wean down oxygen  Ventricular Tachycardia s/p Defibrillation x2 - Appreciate Cardiology evaluation.  Abnormal rhythm was felt to be SVT with aberration and not true ventricular tachycardia - No indication for LHC at this time  - Beta blockers on hold  HFrEF CAD s/p CABG HTN Continue home plavix, eliquis  Pre-Diabetes SSI coverage  At Risk Malnutrition PO  diet  Paroxsymal Atrial Fibrillation On Eliquis  HLD Continue home lipitor   Stable for transfer out of ICU and to hospitalist service  Best Practice (right click and "Reselect all SmartList Selections" daily)  Diet/type: Regular consistency (see orders) DVT prophylaxis: Eliquis GI prophylaxis: PPI Lines: N/A Foley:  N/A Code Status:  full code Last date of multidisciplinary goals of care discussion:  10/6 Wife updated at bedside.  Patient updated 10/7 and 10/8  Critical care time:    The patient is critically ill with multiple organ system failure and requires high complexity decision making for assessment and support, frequent evaluation and titration of therapies, advanced monitoring, review of radiographic studies and interpretation of complex data.   Critical Care Time devoted to patient care services, exclusive of separately billable procedures, described in this note is 35 minutes.   Marshell Garfinkel MD [facility] See Amion for pager  If no response to pager , please call [phone] until 7pm After 7:00 pm call Elink  [phone] 07/06/2022, 10:20 AM

## 2022-07-06 NOTE — Progress Notes (Signed)
Rounding Note    Patient Name: Dennis Preston Date of Encounter: 07/06/2022  [place] Cardiologist: None [facility] cardiology  Subjective   Sitting in chair.  Speech is seeing him.  Had some choking yesterday while eating.  Inpatient Medications    Scheduled Meds:  apixaban  5 mg Oral BID   arformoterol  15 mcg Nebulization BID   atorvastatin  80 mg Oral q1800   budesonide (PULMICORT) nebulizer solution  0.5 mg Nebulization BID   clopidogrel  75 mg Oral QPM   insulin aspart  0-9 Units Subcutaneous Q4H   nicotine  21 mg Transdermal Daily   predniSONE  40 mg Oral Q breakfast   revefenacin  175 mcg Nebulization Daily   Continuous Infusions:  sodium chloride Stopped (07/05/22 0913)   cefTRIAXone (ROCEPHIN)  IV Stopped (07/06/22 0641)   PRN Meds: sodium chloride, acetaminophen, atropine, docusate, ipratropium-albuterol, ondansetron (ZOFRAN) IV, polyethylene glycol   Vital Signs    Vitals:   07/06/22 0700 07/06/22 0800 07/06/22 0826 07/06/22 0832  BP: (!) 138/48 (!) 161/113  (!) 151/60  Pulse: (!) 43 74  69  Resp: 17 20  18   Temp: 97.9 F (36.6 C)     TempSrc: Oral     SpO2: 95% 91% 92% 95%  Weight:      Height:        Intake/Output Summary (Last 24 hours) at 07/06/2022 1041 Last data filed at 07/06/2022 0800 Gross per 24 hour  Intake 340 ml  Output 1550 ml  Net -1210 ml      07/05/2022    5:00 AM 07/03/2022    5:00 PM 07/03/2022    6:47 AM  Last 3 Weights  Weight (lbs) 171 lb 11.8 oz 169 lb 15.6 oz 170 lb  Weight (kg) 77.9 kg 77.1 kg 77.111 kg      Telemetry    SB 40-50's previously.  Now sinus rhythm. Prior PAT with [facility] - Personally Reviewed  ECG    Several EKGs reviewed, left bundle branch block like appearance wide-complex.- Personally Reviewed  Physical Exam   GEN: No acute distress.  Sitting up in chair. Neck: No JVD Cardiac: Loletha Grayer reg, no murmurs, rubs, or gallops.  Respiratory: Clear to auscultation bilaterally. GI: Soft,  nontender, non-distended  MS: No edema; No deformity. Neuro:  Nonfocal  Psych: Normal affect   Labs    High Sensitivity Troponin:   Recent Labs  Lab 07/03/22 0612 07/03/22 0937 07/04/22 1350 07/04/22 1615  TROPONINIHS 53* 260* 261* 230*     Chemistry Recent Labs  Lab 07/03/22 0612 07/03/22 0621 07/04/22 0540 07/04/22 1615 07/05/22 0317 07/06/22 0157  NA 137   < > 139  --  142 141  K 4.8   < > 4.4  --  4.5 5.1  CL 100   < > 100  --  103 105  CO2 27  --  27  --  30 23  GLUCOSE 216*   < > 151*  --  113* 101*  BUN 12   < > 33*  --  23 20  CREATININE 1.33*   < > 1.32*  --  1.05 0.85  CALCIUM 8.9  --  8.9  --  8.7* 8.9  MG  --   --  2.2 2.3  --   --   PROT 6.6  --   --   --  5.4* 5.7*  ALBUMIN 4.1  --   --   --  2.9* 3.3*  AST 29  --   --   --  20 28  ALT 21  --   --   --  16 25  ALKPHOS 87  --   --   --  58 59  BILITOT 0.5  --   --   --  0.2* 0.4  GFRNONAA 58*  --  58*  --  >60 >60  ANIONGAP 10  --  12  --  9 13   < > = values in this interval not displayed.    Lipids  Recent Labs  Lab 07/04/22 0540  TRIG 83    Hematology Recent Labs  Lab 07/04/22 0540 07/05/22 0317 07/06/22 0157  WBC 17.1* 13.0* 12.5*  RBC 4.45 4.30 4.89  HGB 14.2 13.7 15.5  HCT 41.1 40.6 46.7  MCV 92.4 94.4 95.5  MCH 31.9 31.9 31.7  MCHC 34.5 33.7 33.2  RDW 14.6 15.1 14.7  PLT 169 161 149*   Thyroid No results for input(s): "TSH", "FREET4" in the last 168 hours.  BNP Recent Labs  Lab 07/03/22 0612  BNP 166.9*    DDimer No results for input(s): "DDIMER" in the last 168 hours.   Radiology    ECHOCARDIOGRAM COMPLETE  Result Date: 07/04/2022    ECHOCARDIOGRAM REPORT   Patient Name:   Dennis Preston Date of Exam: 07/04/2022 Medical Rec #:  [id number]       Height:       67.0 in Accession #:    [id number]      Weight:       170.0 lb Date of Birth:  Nov 20, 1950      BSA:          1.887 m Patient Age:    71 years        BP:           98/51 mmHg Patient Gender: M               HR:            91 bpm. Exam Location:  Inpatient Procedure: 2D Echo, Color Doppler and Cardiac Doppler Indications:    Acute Ischemic Heart Disease i24.9  History:        Patient has prior history of Echocardiogram examinations, most                 recent 02/17/2022. CAD, Prior CABG, COPD, Arrythmias:Atrial                 Fibrillation; Risk Factors:Hypertension and Dyslipidemia.  Sonographer:    Raquel Sarna Senior RDCS Referring Phys: [id number] Lily Kocher  Sonographer Comments: Technically difficult due to lung interference from COPD IMPRESSIONS  1. Left ventricular ejection fraction, by estimation, is 55 to 60%. The left ventricle has normal function. The left ventricle has no regional wall motion abnormalities. Left ventricular diastolic parameters are consistent with Grade I diastolic dysfunction (impaired relaxation).  2. Right ventricular systolic function is normal. The right ventricular size is normal. Tricuspid regurgitation signal is inadequate for assessing PA pressure.  3. The mitral valve is normal in structure. Trivial mitral valve regurgitation.  4. The aortic valve is tricuspid. There is mild calcification of the aortic valve. There is mild thickening of the aortic valve. Aortic valve regurgitation is not visualized. Aortic valve sclerosis/calcification is present, without any evidence of aortic stenosis.  5. The inferior vena cava is normal in size with greater than 50% respiratory variability, suggesting right atrial pressure of 3 mmHg. Comparison(s): No  significant change from prior study. FINDINGS  Left Ventricle: Left ventricular ejection fraction, by estimation, is 55 to 60%. The left ventricle has normal function. The left ventricle has no regional wall motion abnormalities. The left ventricular internal cavity size was normal in size. There is  no left ventricular hypertrophy. Left ventricular diastolic parameters are consistent with Grade I diastolic dysfunction (impaired relaxation). Right Ventricle: The  right ventricular size is normal. No increase in right ventricular wall thickness. Right ventricular systolic function is normal. Tricuspid regurgitation signal is inadequate for assessing PA pressure. Left Atrium: Left atrial size was normal in size. Right Atrium: Right atrial size was normal in size. Pericardium: There is no evidence of pericardial effusion. Mitral Valve: The mitral valve is normal in structure. Trivial mitral valve regurgitation. Tricuspid Valve: The tricuspid valve is normal in structure. Tricuspid valve regurgitation is trivial. Aortic Valve: The aortic valve is tricuspid. There is mild calcification of the aortic valve. There is mild thickening of the aortic valve. Aortic valve regurgitation is not visualized. Aortic valve sclerosis/calcification is present, without any evidence of aortic stenosis. Pulmonic Valve: The pulmonic valve was normal in structure. Pulmonic valve regurgitation is trivial. Aorta: The aortic root is normal in size and structure. Venous: The inferior vena cava is normal in size with greater than 50% respiratory variability, suggesting right atrial pressure of 3 mmHg. IAS/Shunts: The atrial septum is grossly normal.  LEFT VENTRICLE PLAX 2D LVIDd:         4.90 cm   Diastology LVIDs:         3.00 cm   LV e' medial:    7.51 cm/s LV PW:         1.00 cm   LV E/e' medial:  11.9 LV IVS:        0.70 cm   LV e' lateral:   12.20 cm/s LVOT diam:     2.20 cm   LV E/e' lateral: 7.3 LV SV:         78 LV SV Index:   41 LVOT Area:     3.80 cm  RIGHT VENTRICLE TAPSE (M-mode): 1.4 cm LEFT ATRIUM             Index        RIGHT ATRIUM           Index LA diam:        3.10 cm 1.64 cm/m   RA Area:     13.80 cm LA Vol (A2C):   48.1 ml 25.49 ml/m  RA Volume:   31.30 ml  16.59 ml/m LA Vol (A4C):   23.6 ml 12.51 ml/m LA Biplane Vol: 37.1 ml 19.66 ml/m  AORTIC VALVE LVOT Vmax:   109.00 cm/s LVOT Vmean:  69.500 cm/s LVOT VTI:    0.204 m  AORTA Ao Root diam: 3.00 cm MITRAL VALVE MV Area (PHT):  3.83 cm     SHUNTS MV Decel Time: 198 msec     Systemic VTI:  0.20 m MV E velocity: 89.10 cm/s   Systemic Diam: 2.20 cm MV A velocity: 133.00 cm/s MV E/A ratio:  0.67 Gwyndolyn Kaufman MD Electronically signed by Gwyndolyn Kaufman MD Signature Date/Time: 07/04/2022/3:33:43 PM    Final     Cardiac Studies   CABG (LIMA to LAD, vein graft to ramus, vein graft to PDA now with occluded vein graft to obtuse marginal  Patient Profile     71 y.o. male with coronary disease status post CABG, paroxysmal atrial fibrillation with  wide-complex tachycardia felt to be SVT, not VT with acute respiratory failure  Assessment & Plan    Wide-complex tachycardia - Most likely SVT with widening of QRS compared to baseline.  Same axis. -Amiodarone was stopped. -Beta-blockers were stopped yesterday secondary to sinus bradycardia in the 30s and 40s.  He received a dose of atropine yesterday.  He previously was on very low-dose of metoprolol 12.5 twice a day.  We were trying to utilize this to help suppress potential return of SVT.   -SVT noted yesterday at around 7:30 AM briefly. -Ejection fraction normal.  Coronary artery disease status post CABG - Grafts reviewed.  Agree, no urgent need for left heart catheterization.  Do not believe that this was ventricular tachycardia. -Ejection fraction normal 55-60, reassuring  Hyperlipidemia - On atorvastatin 80 mg once a day.  Continue with high intensity statin.  LDL goal less than 70.  Paroxysmal atrial fibrillation - On home Eliquis.  He is also on Plavix.  Watch for any signs of bleeding.  Followed at [facility] cardiology.     For questions or updates, please contact [place] Please consult [email or web address] for contact info under        Signed, Candee Furbish, MD  07/06/2022, 10:41 AM

## 2022-07-06 NOTE — Progress Notes (Signed)
Received to 6E23 from [facility]. O2@4L  [place]. [place] oral suction set up at bedside. Call bell in reach. A and O x 4. Oriented to room/unit. Cardiac monitor verified with CCMD ([id number]) and applied. Encouraged to call prn oob etc. Verbalized understanding.

## 2022-07-07 DIAGNOSIS — I471 Supraventricular tachycardia, unspecified: Secondary | ICD-10-CM

## 2022-07-07 DIAGNOSIS — I472 Ventricular tachycardia, unspecified: Secondary | ICD-10-CM | POA: Diagnosis not present

## 2022-07-07 LAB — CBC
HCT: 44.3 % (ref 39.0–52.0)
Hemoglobin: 14.9 g/dL (ref 13.0–17.0)
MCH: 31.2 pg (ref 26.0–34.0)
MCHC: 33.6 g/dL (ref 30.0–36.0)
MCV: 92.9 fL (ref 80.0–100.0)
Platelets: 141 10*3/uL — ABNORMAL LOW (ref 150–400)
RBC: 4.77 MIL/uL (ref 4.22–5.81)
RDW: 14 % (ref 11.5–15.5)
WBC: 9.1 10*3/uL (ref 4.0–10.5)
nRBC: 0 % (ref 0.0–0.2)

## 2022-07-07 LAB — GLUCOSE, CAPILLARY
Glucose-Capillary: 112 mg/dL — ABNORMAL HIGH (ref 70–99)
Glucose-Capillary: 113 mg/dL — ABNORMAL HIGH (ref 70–99)
Glucose-Capillary: 115 mg/dL — ABNORMAL HIGH (ref 70–99)
Glucose-Capillary: 138 mg/dL — ABNORMAL HIGH (ref 70–99)
Glucose-Capillary: 141 mg/dL — ABNORMAL HIGH (ref 70–99)
Glucose-Capillary: 147 mg/dL — ABNORMAL HIGH (ref 70–99)
Glucose-Capillary: 159 mg/dL — ABNORMAL HIGH (ref 70–99)

## 2022-07-07 LAB — BASIC METABOLIC PANEL
Anion gap: 11 (ref 5–15)
BUN: 13 mg/dL (ref 8–23)
CO2: 25 mmol/L (ref 22–32)
Calcium: 8.9 mg/dL (ref 8.9–10.3)
Chloride: 103 mmol/L (ref 98–111)
Creatinine, Ser: 0.71 mg/dL (ref 0.61–1.24)
GFR, Estimated: >60 mL/min (ref >60)
Glucose, Bld: 130 mg/dL — ABNORMAL HIGH (ref 70–99)
Potassium: 4.6 mmol/L (ref 3.5–5.1)
Sodium: 139 mmol/L (ref 135–145)

## 2022-07-07 LAB — HYPERSENSITIVITY PNEUMONITIS
A. Pullulans Abs: NEGATIVE
A.Fumigatus #1 Abs: NEGATIVE
Micropolyspora faeni, IgG: NEGATIVE
Pigeon Serum Abs: NEGATIVE
Thermoact. Saccharii: NEGATIVE
Thermoactinomyces vulgaris, IgG: NEGATIVE

## 2022-07-07 LAB — MAGNESIUM: Magnesium: 2 mg/dL (ref 1.7–2.4)

## 2022-07-07 NOTE — Progress Notes (Signed)
PROGRESS NOTE    NASH BOLLS  VOH:[id number] DOB: 1951-05-03 DOA: 07/03/2022 PCP: Tracie Harrier, MD  Chief Complaint  Patient presents with   Code STEMI    Brief Narrative:  Mr. Gorter is Sherial Ebrahim 71 y/o gentleman with Irean Kendricks hx of CAD s/p CABG, HF, HTN, atrial fibrillation, tobacco abuse and etoh abuse who presented 10/5 with acute respiratory distress and SOB.    His wife reports he has been more short of breath over the last 3 days.  He has been vaping since April of this year.  He has been using his oxygen intermittently in the last few days and has had decreased activity tolerance.  With EMS he had saturations 72% on RA at home. Code STEMI was activated by EMS and he was given aspirin, NTG, duoneb, solumedrol. He had 2 runs of pulseless VT with EMS.  Upon arrival the ED he was evaluated by cardiology, who felt this was more likely repolarization abnormality, less likely Kenora Spayd STEMI. He had Laisha Rau recent LHC with open grafts from his CABG other than occlusion of vein graft to obtuse marginal.  Due to worsening mental status and hypoxia he was intubated. CT scan to evaluate for aortic dissection was negative for dissection but demonstrated bilateral airspace disease with multilobar pneumonia and findings consistent with interstitial pneumonitis. During CT scan he developed VT again requiring defibrillation. PCCM consulted for management  Significant events 10/5 Admitted, intubated, developed VT requiring defibrillation 10/6 Extubated, cardiology consulted.  Amiodarone stopped and started on metoprolol  Assessment & Plan:   Principal Problem:   Ventricular tachycardia (HCC) Active Problems:   Chest pain   Assessment and Plan: Acute on Chronic Hypoxic / Hypercarbic Respiratory Failure LUL Pulmonary Infiltrates / Multilobar Pneumonia Mild Acute Respiratory Acidosis  Hx of COPD Tobacco/Vape Use Differential dx includes AECOPD, interstitial pneumonitis / inflammatory process, ILD, bacterial PNA /  aspiration.  Ongoing tobacco use via vaping.  Continue brovana, pulmicort, yupelri  Finished 3 days of azithromycin.  Continue ceftriaxone for 7 days On Prednisone 40 mg.  Can taper over the next week. Respiratory pathogen panel negative Nicotine patch  Wean down oxygen Needs follow up imaging to ensure clearing of multilobar pneumonia/airspace disease   Ventricular Tachycardia s/p Defibrillation x2 - Appreciate Cardiology evaluation.  Abnormal rhythm was felt to be SVT with aberration and not true ventricular tachycardia - planning for event monitor at discharge - No indication for LHC at this time  - Beta blockers on hold due to bradycardia   HFpEF CAD s/p CABG HTN Continue home plavix, eliquis Repeat echo shows EF 55-60% Beta blocker on hold in setting of brady   Pre-Diabetes SSI coverage   At Risk Malnutrition PO diet   Paroxsymal Atrial Fibrillation On Eliquis   HLD Continue home lipitor   PVD Outpt vascular f/u   Pulmonary nodules Outpatient follow up     DVT prophylaxis: eliquis Code Status: full Family Communication: none Disposition:   Status is: Inpatient Remains inpatient appropriate because: continued need for inpatient management   Consultants:  PCCM cardiology  Procedures:  Echo IMPRESSIONS     1. Left ventricular ejection fraction, by estimation, is 55 to 60%. The  left ventricle has normal function. The left ventricle has no regional  wall motion abnormalities. Left ventricular diastolic parameters are  consistent with Grade I diastolic  dysfunction (impaired relaxation).   2. Right ventricular systolic function is normal. The right ventricular  size is normal. Tricuspid regurgitation signal is inadequate for assessing  PA pressure.   3. The mitral valve is normal in structure. Trivial mitral valve  regurgitation.   4. The aortic valve is tricuspid. There is mild calcification of the  aortic valve. There is mild thickening of the  aortic valve. Aortic valve  regurgitation is not visualized. Aortic valve sclerosis/calcification is  present, without any evidence of  aortic stenosis.   5. The inferior vena cava is normal in size with greater than 50%  respiratory variability, suggesting right atrial pressure of 3 mmHg.   Comparison(s): No significant change from prior study.   Antimicrobials:  Anti-infectives (From admission, onward)    Start     Dose/Rate Route Frequency Ordered Stop   07/03/22 0800  azithromycin (ZITHROMAX) 500 mg in sodium chloride 0.9 % 250 mL IVPB        500 mg 250 mL/hr over 60 Minutes Intravenous Every 24 hours 07/03/22 0653 07/05/22 0853   07/03/22 0700  cefTRIAXone (ROCEPHIN) 2 g in sodium chloride 0.9 % 100 mL IVPB        2 g 200 mL/hr over 30 Minutes Intravenous Every 24 hours 07/03/22 0653 07/11/22 0559       Subjective: No complaints  Objective: Vitals:   07/07/22 0755 07/07/22 0756 07/07/22 1200 07/07/22 1422  BP:    (!) 119/56  Pulse:    (!) 55  Resp:    19  Temp:    98.1 F (36.7 C)  TempSrc:    Oral  SpO2: 94% 94% 94% 95%  Weight:      Height:        Intake/Output Summary (Last 24 hours) at 07/07/2022 1435 Last data filed at 07/07/2022 0500 Gross per 24 hour  Intake 0 ml  Output 1170 ml  Net -1170 ml   Filed Weights   07/03/22 1700 07/05/22 0500 07/07/22 0505  Weight: 77.1 kg 77.9 kg 74.6 kg    Examination:  General: No acute distress. Cardiovascular: RRR Lungs: diminished bilaterally  Abdomen: Soft, nontender, nondistended Neurological: Alert and oriented 3. Moves all extremities 4. Cranial nerves II through XII grossly intact. Extremities: No clubbing or cyanosis. No edema.   Data Reviewed: I have personally reviewed following labs and imaging studies  CBC: Recent Labs  Lab 07/03/22 0612 07/03/22 0621 07/03/22 0827 07/04/22 0540 07/05/22 0317 07/06/22 0157 07/07/22 0141  WBC 19.6*  18.1*  --   --  17.1* 13.0* 12.5* 9.1  NEUTROABS 17.3*   --   --   --  10.7*  --   --   HGB 16.1  18.3*   < > 17.3* 14.2 13.7 15.5 14.9  HCT 51.4  57.0*   < > 51.0 41.1 40.6 46.7 44.3  MCV 98.1  99.7  --   --  92.4 94.4 95.5 92.9  PLT 224  243  --   --  169 161 149* 141*   < > = values in this interval not displayed.    Basic Metabolic Panel: Recent Labs  Lab 07/03/22 0612 07/03/22 0621 07/03/22 0827 07/04/22 0540 07/04/22 1615 07/05/22 0317 07/06/22 0157 07/07/22 0141  NA 137 137 131* 139  --  142 141 139  K 4.8 5.1 5.5* 4.4  --  4.5 5.1 4.6  CL 100 101  --  100  --  103 105 103  CO2 27  --   --  27  --  _0 GLUCOSE 216* 208*  --  151*  --  113* 101* 130*  BUN 12  18  --  33*  --  _0 CREATININE 1.33* 1.20  --  1.32*  --  1.05 0.85 0.71  CALCIUM 8.9  --   --  8.9  --  8.7* 8.9 8.9  MG  --   --   --  2.2 2.3  --   --  2.0  PHOS  --   --   --  3.0 3.2  --   --   --     GFR: Estimated Creatinine Clearance: 81.1 mL/min (by C-G formula based on SCr of 0.71 mg/dL).  Liver Function Tests: Recent Labs  Lab 07/03/22 0612 07/05/22 0317 07/06/22 0157  AST _1 ALT _2 ALKPHOS 87 58 59  BILITOT 0.5 0.2* 0.4  PROT 6.6 5.4* 5.7*  ALBUMIN 4.1 2.9* 3.3*    CBG: Recent Labs  Lab 07/07/22 0026 07/07/22 0402 07/07/22 0503 07/07/22 0815 07/07/22 1243  GLUCAP 113* 115* 112* 141* 159*     Recent Results (from the past 240 hour(s))  SARS Coronavirus 2 by RT PCR (hospital order, performed in [facility] hospital lab) *cepheid single result test* Anterior Nasal Swab     Status: None   Collection Time: 07/03/22  6:53 AM   Specimen: Anterior Nasal Swab  Result Value Ref Range Status   SARS Coronavirus 2 by RT PCR NEGATIVE NEGATIVE Final    Comment: (NOTE) SARS-CoV-2 target nucleic acids are NOT DETECTED.  The SARS-CoV-2 RNA is generally detectable in upper and lower respiratory specimens during the acute phase of infection. The lowest concentration of SARS-CoV-2 viral copies this assay can detect is  250 copies / mL. Joycelyn Liska negative result does not preclude SARS-CoV-2 infection and should not be used as the sole basis for treatment or other patient management decisions.  Shahida Schnackenberg negative result may occur with improper specimen collection / handling, submission of specimen other than nasopharyngeal swab, presence of viral mutation(s) within the areas targeted by this assay, and inadequate number of viral copies (<250 copies / mL). Esra Frankowski negative result must be combined with clinical observations, patient history, and epidemiological information.  Fact Sheet for Patients:   https://www.patel.info/  Fact Sheet for Healthcare Providers: https://hall.com/  This test is not yet approved or  cleared by the [place] FDA and has been authorized for detection and/or diagnosis of SARS-CoV-2 by FDA under an Emergency Use Authorization (EUA).  This EUA will remain in effect (meaning this test can be used) for the duration of the COVID-19 declaration under Section 564(b)(1) of the Act, 21 U.S.C. section 360bbb-3(b)(1), unless the authorization is terminated or revoked sooner.  Performed at [facility], [place] [address], [place], [place] [address]   Respiratory (~20 pathogens) panel by PCR     Status: None   Collection Time: 07/03/22  6:53 AM   Specimen: Anterior Nasal Swab; Respiratory  Result Value Ref Range Status   Adenovirus NOT DETECTED NOT DETECTED Final   Coronavirus 229E NOT DETECTED NOT DETECTED Final    Comment: (NOTE) The Coronavirus on the Respiratory Panel, DOES NOT test for the novel  Coronavirus (2019 nCoV)    Coronavirus HKU1 NOT DETECTED NOT DETECTED Final   Coronavirus NL63 NOT DETECTED NOT DETECTED Final   Coronavirus OC43 NOT DETECTED NOT DETECTED Final   Metapneumovirus NOT DETECTED NOT DETECTED Final   Rhinovirus / Enterovirus NOT DETECTED NOT DETECTED Final   Influenza Zeus Marquis NOT DETECTED NOT DETECTED Final   Influenza B NOT  DETECTED NOT DETECTED Final   Parainfluenza Virus 1 NOT DETECTED NOT DETECTED Final   Parainfluenza Virus 2 NOT DETECTED NOT DETECTED Final   Parainfluenza Virus 3 NOT DETECTED NOT DETECTED Final   Parainfluenza Virus 4 NOT DETECTED NOT DETECTED Final   Respiratory Syncytial Virus NOT DETECTED NOT DETECTED Final   Bordetella pertussis NOT DETECTED NOT DETECTED Final   Bordetella Parapertussis NOT DETECTED NOT DETECTED Final   Chlamydophila pneumoniae NOT DETECTED NOT DETECTED Final   Mycoplasma pneumoniae NOT DETECTED NOT DETECTED Final    Comment: Performed at [facility], [place] [address] [place], [place] [address]  Culture, blood (Routine X 2) w Reflex to ID Panel     Status: None (Preliminary result)   Collection Time: 07/03/22  8:19 AM   Specimen: BLOOD  Result Value Ref Range Status   Specimen Description BLOOD RIGHT ANTECUBITAL  Final   Special Requests   Final    BOTTLES DRAWN AEROBIC AND ANAEROBIC Blood Culture adequate volume   Culture   Final    NO GROWTH 4 DAYS Performed at [facility], [place] [address] [place], [place] [address]    Report Status PENDING  Incomplete  Culture, Respiratory w Gram Stain (tracheal aspirate)     Status: None   Collection Time: 07/03/22  8:19 AM   Specimen: Bronchoalveolar Lavage; Respiratory  Result Value Ref Range Status   Specimen Description BRONCHIAL ALVEOLAR LAVAGE  Final   Special Requests NONE  Final   Gram Stain   Final    RARE WBC PRESENT, PREDOMINANTLY PMN NO ORGANISMS SEEN    Culture   Final    NO GROWTH 2 DAYS Performed at [place] [address], [place], [place] [address]    Report Status 07/05/2022 FINAL  Final  Culture, blood (Routine X 2) w Reflex to ID Panel     Status: None (Preliminary result)   Collection Time: 07/03/22  8:24 AM   Specimen: BLOOD RIGHT FOREARM  Result Value Ref Range Status   Specimen Description BLOOD RIGHT FOREARM  Final   Special Requests   Final    BOTTLES DRAWN  AEROBIC ONLY Blood Culture adequate volume   Culture   Final    NO GROWTH 4 DAYS Performed at [facility], [place] [address], [place], [place] [address]    Report Status PENDING  Incomplete  MRSA Next Gen by PCR, Nasal     Status: None   Collection Time: 07/03/22 10:12 AM   Specimen: Nasal Mucosa; Nasal Swab  Result Value Ref Range Status   MRSA by PCR Next Gen NOT DETECTED NOT DETECTED Final    Comment: (NOTE) The GeneXpert MRSA Assay (FDA approved for NASAL specimens only), is one component of Giovanny Dugal comprehensive MRSA colonization surveillance program. It is not intended to diagnose MRSA infection nor to guide or monitor treatment for MRSA infections. Test performance is not FDA approved in patients less than 25 years old. Performed at [facility], [place] [address] [place], [place] [address]   Culture, BAL-quantitative w Gram Stain     Status: Abnormal   Collection Time: 07/03/22 11:40 AM   Specimen: Bronchoalveolar Lavage; Tracheal  Result Value Ref Range Status   Specimen Description BRONCHIAL ALVEOLAR LAVAGE  Final   Special Requests NONE  Final   Gram Stain   Final    RARE WBC PRESENT, PREDOMINANTLY PMN NO ORGANISMS SEEN    Culture (Biff Rutigliano)  Final  10,000 COLONIES/mL Normal respiratory flora-no Staph aureus or Pseudomonas seen Performed at [facility], [place] [address] [place],  [address]    Report Status 07/05/2022 FINAL  Final         Radiology Studies: No results found.      Scheduled Meds:  apixaban  5 mg Oral BID   arformoterol  15 mcg Nebulization BID   atorvastatin  80 mg Oral q1800   budesonide (PULMICORT) nebulizer solution  0.5 mg Nebulization BID   clopidogrel  75 mg Oral QPM   insulin aspart  0-9 Units Subcutaneous Q4H   nicotine  21 mg Transdermal Daily   predniSONE  40 mg Oral Q breakfast   revefenacin  175 mcg Nebulization Daily   Continuous Infusions:  sodium chloride Stopped (07/05/22 0913)   cefTRIAXone (ROCEPHIN)   IV 2 g (07/07/22 0650)     LOS: 4 days    Time spent: over 30 min    Fayrene Helper, MD [facility]   To contact the attending provider between 7A-7P or the covering provider during after hours 7P-7A, please log into the web site [email or web address] and access using universal [place] password for that web site. If you do not have the password, please call the hospital operator.  07/07/2022, 2:35 PM

## 2022-07-07 NOTE — Care Management Important Message (Signed)
Important Message  Patient Details  Name: Dennis Preston MRN: [id number] Date of Birth: November 06, 1950   Medicare Important Message Given:  Yes     Shelda Altes 07/07/2022, 9:42 AM

## 2022-07-07 NOTE — Progress Notes (Signed)
Rounding Note    Patient Name: Dennis Preston Date of Encounter: 07/07/2022  [place] Cardiologist:  Dennis Preston cardiology  Subjective   His rhythm changes when started on nebulizer tx this morning at 0755.  No chest pain. Seen while getting tx. Breathing stable.   Inpatient Medications    Scheduled Meds:  apixaban  5 mg Oral BID   arformoterol  15 mcg Nebulization BID   atorvastatin  80 mg Oral q1800   budesonide (PULMICORT) nebulizer solution  0.5 mg Nebulization BID   clopidogrel  75 mg Oral QPM   insulin aspart  0-9 Units Subcutaneous Q4H   nicotine  21 mg Transdermal Daily   predniSONE  40 mg Oral Q breakfast   revefenacin  175 mcg Nebulization Daily   Continuous Infusions:  sodium chloride Stopped (07/05/22 0913)   cefTRIAXone (ROCEPHIN)  IV 2 g (07/07/22 0650)   PRN Meds: sodium chloride, acetaminophen, atropine, docusate, ipratropium-albuterol, ondansetron (ZOFRAN) IV, polyethylene glycol   Vital Signs    Vitals:   07/06/22 2042 07/07/22 0505 07/07/22 0755 07/07/22 0756  BP: 107/87 (!) 149/53    Pulse: (!) 51 (!) 44    Resp:  18    Temp: 97.7 F (36.5 C) 97.8 F (36.6 C)    TempSrc: Oral Oral    SpO2: 97% 98% 94% 94%  Weight:  74.6 kg    Height:        Intake/Output Summary (Last 24 hours) at 07/07/2022 0811 Last data filed at 07/07/2022 0500 Gross per 24 hour  Intake 0 ml  Output 1170 ml  Net -1170 ml      07/07/2022    5:05 AM 07/05/2022    5:00 AM 07/03/2022    5:00 PM  Last 3 Weights  Weight (lbs) 164 lb 6.4 oz 171 lb 11.8 oz 169 lb 15.6 oz  Weight (kg) 74.571 kg 77.9 kg 77.1 kg      Telemetry    Sinus bradycardia 40s, PVCs, PACs and BBB - Personally Reviewed  ECG    Pending   Physical Exam   GEN: No acute distress.   Neck: No JVD Cardiac: RRR, no murmurs, rubs, or gallops.  Respiratory: Clear to auscultation bilaterally. GI: Soft, nontender, non-distended  MS: No edema; No deformity. Neuro:  Nonfocal  Psych:  Normal affect   Labs    High Sensitivity Troponin:   Recent Labs  Lab 07/03/22 0612 07/03/22 0937 07/04/22 1350 07/04/22 1615  TROPONINIHS 53* 260* 261* 230*     Chemistry Recent Labs  Lab 07/03/22 0612 07/03/22 0621 07/04/22 0540 07/04/22 1615 07/05/22 0317 07/06/22 0157 07/07/22 0141  NA 137   < > 139  --  142 141 139  K 4.8   < > 4.4  --  4.5 5.1 4.6  CL 100   < > 100  --  103 105 103  CO2 27  --  27  --  30 23 25   GLUCOSE 216*   < > 151*  --  113* 101* 130*  BUN 12   < > 33*  --  23 20 13   CREATININE 1.33*   < > 1.32*  --  1.05 0.85 0.71  CALCIUM 8.9  --  8.9  --  8.7* 8.9 8.9  MG  --   --  2.2 2.3  --   --   --   PROT 6.6  --   --   --  5.4* 5.7*  --   ALBUMIN  4.1  --   --   --  2.9* 3.3*  --   AST 29  --   --   --  20 28  --   ALT 21  --   --   --  16 25  --   ALKPHOS 87  --   --   --  58 59  --   BILITOT 0.5  --   --   --  0.2* 0.4  --   GFRNONAA 58*  --  58*  --  >60 >60 >60  ANIONGAP 10  --  12  --  9 13 11    < > = values in this interval not displayed.    Lipids  Recent Labs  Lab 07/04/22 0540  TRIG 83    Hematology Recent Labs  Lab 07/05/22 0317 07/06/22 0157 07/07/22 0141  WBC 13.0* 12.5* 9.1  RBC 4.30 4.89 4.77  HGB 13.7 15.5 14.9  HCT 40.6 46.7 44.3  MCV 94.4 95.5 92.9  MCH 31.9 31.7 31.2  MCHC 33.7 33.2 33.6  RDW 15.1 14.7 14.0  PLT 161 149* 141*   Thyroid No results for input(s): "TSH", "FREET4" in the last 168 hours.  BNP Recent Labs  Lab 07/03/22 0612  BNP 166.9*    DDimer No results for input(s): "DDIMER" in the last 168 hours.   Radiology    No results found.  Cardiac Studies   Echo 07/04/2022 1. Left ventricular ejection fraction, by estimation, is 55 to 60%. The  left ventricle has normal function. The left ventricle has no regional  wall motion abnormalities. Left ventricular diastolic parameters are  consistent with Grade I diastolic  dysfunction (impaired relaxation).   2. Right ventricular systolic function  is normal. The right ventricular  size is normal. Tricuspid regurgitation signal is inadequate for assessing  PA pressure.   3. The mitral valve is normal in structure. Trivial mitral valve  regurgitation.   4. The aortic valve is tricuspid. There is mild calcification of the  aortic valve. There is mild thickening of the aortic valve. Aortic valve  regurgitation is not visualized. Aortic valve sclerosis/calcification is  present, without any evidence of  aortic stenosis.   5. The inferior vena cava is normal in size with greater than 50%  respiratory variability, suggesting right atrial pressure of 3 mmHg.   Comparison(s): No significant change from prior study.   Patient Profile     71 y.o. male  with a hx of CAD s/p CABG (LIMA to LAD, vein graft to ramus, vein graft to PDA now with occluded vein graft to obtuse marginal), chronic diastolic heart failure, aortic atherosclerosis, peripheral vascular disease s/p bilateral femoral endarterectomies/endovascular repair, paroxysmal afib, HTN, HLD, tobacco abuse, COPD, ILD, ETOH abuse who is admitted for acute on chronic hypoxic respiratory failure 2nd to multilobar pneumonia.   Cardiology is asked evaluation of recurrent VT at the request of Dr. Tacy Learn. Cancelled CODE STEMI. Required defibrillation x2. However rhythm felt to be wide-complex tachycardia with most likely SVT (not VT).   Assessment & Plan    Wide complex tachycardia Bradycardia 3.    Intermittent LBBB -Initial concern was for VT requiring defibrillation x 2. Amiodarone stopped. Felt rhythm most likely SVT with widening QRS compared to baseline.  - BB stopped due to sinus bradycardia in 30-40s - Echo showed preserved LVEF without regional  WM abnormality  - Dennis Preston his rhythm changes while started on nebulized ( 3 different) this morning around 0755. Seems  sinus rhythm with LBBB. HR in 70s. No chest pain or palpitations.  - His K is 4.6 - Will get EKG - Consider EP evaluation  vs outpatient monitor   4. CAD s/p CABG - Last cath 01/2022 Patent LIMA to LAD, SVG to RI and SVG to RPDA/RPL). Known occluded SVG to Lcx/OM - Continue Plavix and statin  - BB stopped 2nd to bradycardia  5. PAF - On Eliquis for anticoagulation   6. HLD - Continue statin  - 02/17/2022: Cholesterol 121; HDL 56; LDL Cholesterol 42; VLDL 23 07/04/2022: Triglycerides 83   For questions or updates, please contact [place] Please consult [email or web address] for contact info under        Signed, Leanor Kail, PA  07/07/2022, 8:11 AM   d

## 2022-07-07 NOTE — Progress Notes (Signed)
Mobility Specialist Progress Note:   07/07/22 1010  Mobility  Activity Ambulated with assistance in hallway  Activity Response Tolerated well  Distance Ambulated (ft) 250 ft  $Mobility charge 1 Mobility  Level of Assistance Standby assist, set-up cues, supervision of patient - no hands on  Assistive Device Front wheel walker   Pt received in bed willing to participate in mobility. No complaints of pain. Left in bed with call bell in reach and all needs met.   Dennis Preston Mobility Specialist Secure Chat only   

## 2022-07-07 NOTE — Progress Notes (Signed)
Pt given evening neb treatments.  Pt states he is utilizing his flutter valve and it is helping him cough up a lot of secretions.  Encouraged pt to continue with flutter exercise.

## 2022-07-07 NOTE — Progress Notes (Signed)
Speech Language Pathology Treatment: Dysphagia  Patient Details Name: Dennis Preston MRN: [id number] DOB: 1951-02-27 Today's Date: 07/07/2022 Time: 1010-1020 SLP Time Calculation (min) (ACUTE ONLY): 10 min  Assessment / Plan / Recommendation Clinical Impression  Dennis Preston reports no further choking episodes similar to the one he experienced while eating eggs the other morning. He is taking care to avoid grits and other foods that break into particulate pieces.  He is self-feeding; consumed crackers/peanut butter and drank water with no s/s of dysphagia per my observation. Independent with precautions. No further SLP f/u needed - our service will s/o. D/W RN.   HPI HPI: 71 yo male admitted 10/5 with SOB and pulseless VT's, requiring defibrillation x 2. Intubated 10/5-10/6. PMhx: COPD, AFib, PAD, CABG, CHF, HTN, HLD, Rt CEA. Pt passed [facility] screen after extubation and started on diet.  Had explosive and prolonged coughing episode on 10/7 while eating scrambled eggs. Reports he "chokes" on solid foods like hamburgers at least 2x/week.      SLP Plan  All goals met      Recommendations for follow up therapy are one component of a multi-disciplinary discharge planning process, led by the attending physician.  Recommendations may be updated based on patient status, additional functional criteria and insurance authorization.    Recommendations  Diet recommendations: Regular;Thin liquid Liquids provided via: Straw;Cup Medication Administration: Whole meds with liquid Supervision: Patient able to self feed                Oral Care Recommendations: Oral care BID Follow Up Recommendations: No SLP follow up SLP Visit Diagnosis: Dysphagia, unspecified (R13.10) Plan: All goals met         Dennis Preston L. Tivis Ringer, MA CCC/SLP Clinical Specialist - Acute Care SLP Acute Rehabilitation Services Office number [phone]   Dennis Preston  07/07/2022, 10:23 AM

## 2022-07-08 DIAGNOSIS — I472 Ventricular tachycardia, unspecified: Secondary | ICD-10-CM | POA: Diagnosis not present

## 2022-07-08 DIAGNOSIS — I471 Supraventricular tachycardia, unspecified: Secondary | ICD-10-CM | POA: Diagnosis not present

## 2022-07-08 LAB — CULTURE, BLOOD (ROUTINE X 2)
Culture: NO GROWTH
Culture: NO GROWTH
Special Requests: ADEQUATE
Special Requests: ADEQUATE

## 2022-07-08 LAB — CBC WITH DIFFERENTIAL/PLATELET
Abs Immature Granulocytes: 0.04 10*3/uL (ref 0.00–0.07)
Basophils Absolute: 0 10*3/uL (ref 0.0–0.1)
Basophils Relative: 0 %
Eosinophils Absolute: 0.1 10*3/uL (ref 0.0–0.5)
Eosinophils Relative: 1 %
HCT: 43 % (ref 39.0–52.0)
Hemoglobin: 15 g/dL (ref 13.0–17.0)
Immature Granulocytes: 0 %
Lymphocytes Relative: 13 %
Lymphs Abs: 1.3 10*3/uL (ref 0.7–4.0)
MCH: 32.1 pg (ref 26.0–34.0)
MCHC: 34.9 g/dL (ref 30.0–36.0)
MCV: 91.9 fL (ref 80.0–100.0)
Monocytes Absolute: 1 10*3/uL (ref 0.1–1.0)
Monocytes Relative: 10 %
Neutro Abs: 7.4 10*3/uL (ref 1.7–7.7)
Neutrophils Relative %: 76 %
Platelets: 186 10*3/uL (ref 150–400)
RBC: 4.68 MIL/uL (ref 4.22–5.81)
RDW: 14 % (ref 11.5–15.5)
WBC: 9.8 10*3/uL (ref 4.0–10.5)
nRBC: 0 % (ref 0.0–0.2)

## 2022-07-08 LAB — GLUCOSE, CAPILLARY
Glucose-Capillary: 106 mg/dL — ABNORMAL HIGH (ref 70–99)
Glucose-Capillary: 109 mg/dL — ABNORMAL HIGH (ref 70–99)
Glucose-Capillary: 137 mg/dL — ABNORMAL HIGH (ref 70–99)
Glucose-Capillary: 155 mg/dL — ABNORMAL HIGH (ref 70–99)
Glucose-Capillary: 162 mg/dL — ABNORMAL HIGH (ref 70–99)
Glucose-Capillary: 214 mg/dL — ABNORMAL HIGH (ref 70–99)

## 2022-07-08 LAB — MAGNESIUM: Magnesium: 2 mg/dL (ref 1.7–2.4)

## 2022-07-08 LAB — COMPREHENSIVE METABOLIC PANEL
ALT: 26 U/L (ref 0–44)
AST: 18 U/L (ref 15–41)
Albumin: 3.1 g/dL — ABNORMAL LOW (ref 3.5–5.0)
Alkaline Phosphatase: 59 U/L (ref 38–126)
Anion gap: 10 (ref 5–15)
BUN: 13 mg/dL (ref 8–23)
CO2: 27 mmol/L (ref 22–32)
Calcium: 8.9 mg/dL (ref 8.9–10.3)
Chloride: 100 mmol/L (ref 98–111)
Creatinine, Ser: 0.8 mg/dL (ref 0.61–1.24)
GFR, Estimated: >60 mL/min (ref >60)
Glucose, Bld: 150 mg/dL — ABNORMAL HIGH (ref 70–99)
Potassium: 4.1 mmol/L (ref 3.5–5.1)
Sodium: 137 mmol/L (ref 135–145)
Total Bilirubin: 0.4 mg/dL (ref 0.3–1.2)
Total Protein: 5.4 g/dL — ABNORMAL LOW (ref 6.5–8.1)

## 2022-07-08 LAB — PHOSPHORUS: Phosphorus: 3.4 mg/dL (ref 2.5–4.6)

## 2022-07-08 LAB — CYTOLOGY - NON PAP

## 2022-07-08 MED ORDER — AMOXICILLIN 500 MG PO CAPS
1000.0000 mg | ORAL_CAPSULE | Freq: Three times a day (TID) | ORAL | Status: DC
  Administered 2022-07-08 – 2022-07-10 (×5): 1000 mg via ORAL
  Filled 2022-07-08 (×6): qty 2

## 2022-07-08 NOTE — Progress Notes (Signed)
Rounding Note    Patient Name: Dennis Preston Date of Encounter: 07/08/2022  [place] Cardiologist: None [facility] cardiology  Subjective   No event since yesterday morning. Feeling well. No chest pain, sob or palpitations.    Inpatient Medications    Scheduled Meds:  apixaban  5 mg Oral BID   arformoterol  15 mcg Nebulization BID   atorvastatin  80 mg Oral q1800   budesonide (PULMICORT) nebulizer solution  0.5 mg Nebulization BID   clopidogrel  75 mg Oral QPM   insulin aspart  0-9 Units Subcutaneous Q4H   nicotine  21 mg Transdermal Daily   predniSONE  40 mg Oral Q breakfast   revefenacin  175 mcg Nebulization Daily   Continuous Infusions:  sodium chloride Stopped (07/05/22 0913)   cefTRIAXone (ROCEPHIN)  IV 2 g (07/08/22 0540)   PRN Meds: sodium chloride, acetaminophen, atropine, docusate, ipratropium-albuterol, ondansetron (ZOFRAN) IV, polyethylene glycol   Vital Signs    Vitals:   07/07/22 2028 07/08/22 0416 07/08/22 0754 07/08/22 0756  BP:  (!) 125/56    Pulse:  (!) 50    Resp:  16    Temp:  98 F (36.7 C)    TempSrc:  Oral    SpO2: 97% 99% 95% 96%  Weight:  74.1 kg    Height:        Intake/Output Summary (Last 24 hours) at 07/08/2022 0759 Last data filed at 07/08/2022 0400 Gross per 24 hour  Intake 100 ml  Output 1400 ml  Net -1300 ml      07/08/2022    4:16 AM 07/07/2022    5:05 AM 07/05/2022    5:00 AM  Last 3 Weights  Weight (lbs) 163 lb 5.8 oz 164 lb 6.4 oz 171 lb 11.8 oz  Weight (kg) 74.1 kg 74.571 kg 77.9 kg      Telemetry    Sinus rhythm, PVCs,  - Personally Reviewed  ECG    N/a  Physical Exam   GEN: No acute distress.   Neck: No JVD Cardiac: RRR, no murmurs, rubs, or gallops.  Respiratory: Clear to auscultation bilaterally. GI: Soft, nontender, non-distended  MS: No edema; No deformity. Neuro:  Nonfocal  Psych: Normal affect   Labs    High Sensitivity Troponin:   Recent Labs  Lab 07/03/22 0612  07/03/22 0937 07/04/22 1350 07/04/22 1615  TROPONINIHS 53* 260* 261* 230*     Chemistry Recent Labs  Lab 07/04/22 1615 07/05/22 0317 07/06/22 0157 07/07/22 0141 07/08/22 0108  NA  --  142 141 139 137  K  --  4.5 5.1 4.6 4.1  CL  --  103 105 103 100  CO2  --  30 23 25 27   GLUCOSE  --  113* 101* 130* 150*  BUN  --  23 20 13 13   CREATININE  --  1.05 0.85 0.71 0.80  CALCIUM  --  8.7* 8.9 8.9 8.9  MG 2.3  --   --  2.0 2.0  PROT  --  5.4* 5.7*  --  5.4*  ALBUMIN  --  2.9* 3.3*  --  3.1*  AST  --  20 28  --  18  ALT  --  16 25  --  26  ALKPHOS  --  58 59  --  59  BILITOT  --  0.2* 0.4  --  0.4  GFRNONAA  --  >60 >60 >60 >60  ANIONGAP  --  9 13 11  10  Lipids  Recent Labs  Lab 07/04/22 0540  TRIG 83    Hematology Recent Labs  Lab 07/06/22 0157 07/07/22 0141 07/08/22 0108  WBC 12.5* 9.1 9.8  RBC 4.89 4.77 4.68  HGB 15.5 14.9 15.0  HCT 46.7 44.3 43.0  MCV 95.5 92.9 91.9  MCH 31.7 31.2 32.1  MCHC 33.2 33.6 34.9  RDW 14.7 14.0 14.0  PLT 149* 141* 186   Thyroid No results for input(s): "TSH", "FREET4" in the last 168 hours.  BNP Recent Labs  Lab 07/03/22 0612  BNP 166.9*    DDimer No results for input(s): "DDIMER" in the last 168 hours.   Radiology    No results found.  Cardiac Studies   Echo 07/04/2022 1. Left ventricular ejection fraction, by estimation, is 55 to 60%. The  left ventricle has normal function. The left ventricle has no regional  wall motion abnormalities. Left ventricular diastolic parameters are  consistent with Grade I diastolic  dysfunction (impaired relaxation).   2. Right ventricular systolic function is normal. The right ventricular  size is normal. Tricuspid regurgitation signal is inadequate for assessing  PA pressure.   3. The mitral valve is normal in structure. Trivial mitral valve  regurgitation.   4. The aortic valve is tricuspid. There is mild calcification of the  aortic valve. There is mild thickening of the aortic  valve. Aortic valve  regurgitation is not visualized. Aortic valve sclerosis/calcification is  present, without any evidence of  aortic stenosis.   5. The inferior vena cava is normal in size with greater than 50%  respiratory variability, suggesting right atrial pressure of 3 mmHg.   Comparison(s): No significant change from prior study.   Patient Profile     71 y.o. male  with a hx of CAD s/p CABG (LIMA to LAD, vein graft to ramus, vein graft to PDA now with occluded vein graft to obtuse marginal), chronic diastolic heart failure, aortic atherosclerosis, peripheral vascular disease s/p bilateral femoral endarterectomies/endovascular repair, paroxysmal afib, HTN, HLD, tobacco abuse, COPD, ILD, ETOH abuse who is admitted for acute on chronic hypoxic respiratory failure 2nd to multilobar pneumonia.    Cardiology is asked evaluation of recurrent VT at the request of Dr. Tacy Learn. Cancelled CODE STEMI. Required defibrillation x2. However rhythm felt to be wide-complex tachycardia with most likely SVT (not VT).   Assessment & Plan    Wide complex tachycardia Bradycardia 3.    Intermittent LBBB 4. PVCs -Initial concern was for VT requiring defibrillation x 2. Amiodarone stopped. Felt rhythm most likely SVT with widening QRS compared to baseline.  - BB stopped due to sinus bradycardia in 30-40s - Echo showed preserved LVEF without regional  WM abnormality  - Had intermittent LBBB at rate of 70s while getting nebulized Tx yesterday AM. However no reoccurrence since then on tx.  -  Currently maintaining sinus rhythm at 60s with frequent PVCs. - K and Mg are stable - outpatient cardiac monitor with primary cardiologist    4. CAD s/p CABG - Last cath 01/2022 Patent LIMA to LAD, SVG to RI and SVG to RPDA/RPL). Known occluded SVG to Lcx/OM - EKG yesterday with ST/T wave changes anterior lateral leads (batter compared to 01/2022) - Continue Plavix and statin  - BB stopped 2nd to bradycardia   5.  PAF - On Eliquis for anticoagulation  - Maintaining sinus rhythm   6. HLD - Continue statin  - 02/17/2022: Cholesterol 121; HDL 56; LDL Cholesterol 42; VLDL 23 07/04/2022: Triglycerides 83  Will sign off. Call with question. Follow up with primary cardiologist at [facility]. Recommended patient to call and make a appointment later this week or early next week.   For questions or updates, please contact [place] Please consult [email or web address] for contact info under        Signed, Leanor Kail, PA  07/08/2022, 7:59 AM

## 2022-07-08 NOTE — Progress Notes (Signed)
SATURATION QUALIFICATIONS: (This note is used to comply with regulatory documentation for home oxygen)  Patient Saturations on Room Air at Rest = 91%  Patient Saturations on Room Air while Ambulating = 77%  Patient Saturations on 2 Liters of oxygen while Ambulating = 92%  Requires supplemental O2 while ambulating

## 2022-07-08 NOTE — Progress Notes (Signed)
Mobility Specialist - Progress Note   07/08/22 0958  Mobility  Activity Ambulated with assistance in hallway  Level of Assistance Standby assist, set-up cues, supervision of patient - no hands on  Assistive Device Front wheel walker  Distance Ambulated (ft) 250 ft  Activity Response Tolerated fair  Mobility Referral Yes  $Mobility charge 1 Mobility    Pre-mobility: 91%SpO2 During mobility:77%SpO2 Post-mobility:92% SpO2  Pt was received in bed and agreeable. Pt c/o weakness and SOB during ambulation. Pt was returned to bed with all needs met and RN notified.   Larey Seat

## 2022-07-08 NOTE — Progress Notes (Signed)
Physical Therapy Treatment Patient Details Name: Dennis Preston MRN: [id number] DOB: 17-Mar-1951 Today's Date: 07/08/2022   History of Present Illness 71 yo male admitted 10/5 with SOB and pulseless VT's, requiring defibrillation x 2. Intubated 10/5-10/6. PMhx: COPD, AFib, PAD, CABG, CHF, HTN, HLD, Rt CEA    PT Comments    Pt ambulated earlier today on 2L with sats in low 90's. However, with PT this afternoon, SPO2 89% at rest on 2L and dropped to 85% on 3L with 100' ambulation with RW. Pt with 3/4 DOE and face very red. HR 117 bpm. Pt required 3 mins seated rest for O2 to return to 89%. RN and MD aware. PT will continue to follow.    Recommendations for follow up therapy are one component of a multi-disciplinary discharge planning process, led by the attending physician.  Recommendations may be updated based on patient status, additional functional criteria and insurance authorization.  Follow Up Recommendations  No PT follow up     Assistance Recommended at Discharge Intermittent Supervision/Assistance  Patient can return home with the following A little help with walking and/or transfers;Assistance with cooking/housework;Assist for transportation;Help with stairs or ramp for entrance;A little help with bathing/dressing/bathroom   Equipment Recommendations  Rolling walker (2 wheels)    Recommendations for Other Services       Precautions / Restrictions Precautions Precautions: Fall;Other (comment) Precaution Comments: watch sats Restrictions Weight Bearing Restrictions: No     Mobility  Bed Mobility Overal bed mobility: Needs Assistance, Modified Independent Bed Mobility: Supine to Sit     Supine to sit: HOB elevated     General bed mobility comments: HOB 30 deg, no assist needed to come to EOB but pt relayed feeling SOB and noted SPO2 to be 89% on 2L O2.    Transfers Overall transfer level: Needs assistance Equipment used: Rolling walker (2 wheels) Transfers:  Sit to/from Stand Sit to Stand: Min guard           General transfer comment: safe with sit>stand    Ambulation/Gait Ambulation/Gait assistance: Min guard Gait Distance (Feet): 100 Feet Assistive device: Rolling walker (2 wheels) Gait Pattern/deviations: Step-through pattern, Decreased stride length Gait velocity: decreased Gait velocity interpretation: 1.31 - 2.62 ft/sec, indicative of limited community ambulator   General Gait Details: SPO2 dropped to 85% by end of 100' ambulation on 3L (turned up to 3L since SPO2 89% at rest). Pt with 3/4 DOE and face red. HR 117 bpm. SPO2 returned to 89% in 3 mins rest on 3L O2   Stairs             Wheelchair Mobility    Modified Rankin (Stroke Patients Only)       Balance Overall balance assessment: Needs assistance   Sitting balance-Leahy Scale: Good     Standing balance support: Bilateral upper extremity supported Standing balance-Leahy Scale: Fair Standing balance comment: able to maintain static standing, pt becomes very shaky when O2 sats drop, RW for stability as well as energy conservation                            Cognition Arousal/Alertness: Awake/alert Behavior During Therapy: WFL for tasks assessed/performed Overall Cognitive Status: Within Functional Limits for tasks assessed  Exercises      General Comments General comments (skin integrity, edema, etc.): reviewed use of O2 at home and self monitoring      Pertinent Vitals/Pain Pain Assessment Pain Assessment: No/denies pain    Home Living                          Prior Function            PT Goals (current goals can now be found in the care plan section) Acute Rehab PT Goals Patient Stated Goal: return home PT Goal Formulation: With patient Time For Goal Achievement: 07/19/22 Potential to Achieve Goals: Good Progress towards PT goals: Not progressing toward  goals - comment (SPO2 dropping)    Frequency    Min 3X/week      PT Plan Current plan remains appropriate;Equipment recommendations need to be updated    Co-evaluation              AM-PAC PT "6 Clicks" Mobility   Outcome Measure  Help needed turning from your back to your side while in a flat bed without using bedrails?: A Little Help needed moving from lying on your back to sitting on the side of a flat bed without using bedrails?: A Little Help needed moving to and from a bed to a chair (including a wheelchair)?: A Little Help needed standing up from a chair using your arms (e.g., wheelchair or bedside chair)?: A Little Help needed to walk in hospital room?: A Little Help needed climbing 3-5 steps with a railing? : A Lot 6 Click Score: 17    End of Session Equipment Utilized During Treatment: Oxygen Activity Tolerance: Treatment limited secondary to medical complications (Comment) (desat) Patient left: with call bell/phone within reach;in bed;Other (comment) (MD in room) Nurse Communication: Mobility status PT Visit Diagnosis: Other abnormalities of gait and mobility (R26.89);Difficulty in walking, not elsewhere classified (R26.2)     Time: 3536-1443 PT Time Calculation (min) (ACUTE ONLY): 18 min  Charges:  $Gait Training: 8-22 mins                     Leighton Roach, PT  Acute Rehab Services Secure chat preferred Office [place] 07/08/2022, 12:52 PM

## 2022-07-08 NOTE — Progress Notes (Signed)
PROGRESS NOTE    [name]  KCM:[id number] DOB: 09-30-1950 DOA: 07/03/2022 PCP: Tracie Harrier, MD  Chief Complaint  Patient presents with   Code STEMI    Brief Narrative:  Mr. Dennis Preston is Dennis Preston 71 y/o gentleman with Ly Bacchi hx of CAD s/p CABG, HF, HTN, atrial fibrillation, tobacco abuse and etoh abuse who presented 10/5 with acute respiratory distress and SOB.    His wife reports he has been more short of breath over the last 3 days.  He has been vaping since April of this year.  He has been using his oxygen intermittently in the last few days and has had decreased activity tolerance.  With EMS he had saturations 72% on RA at home. Code STEMI was activated by EMS and he was given aspirin, NTG, duoneb, solumedrol. He had 2 runs of pulseless VT with EMS.  Upon arrival the ED he was evaluated by cardiology, who felt this was more likely repolarization abnormality, less likely Chae Oommen STEMI. He had Sadako Cegielski recent LHC with open grafts from his CABG other than occlusion of vein graft to obtuse marginal.  Due to worsening mental status and hypoxia he was intubated. CT scan to evaluate for aortic dissection was negative for dissection but demonstrated bilateral airspace disease with multilobar pneumonia and findings consistent with interstitial pneumonitis. During CT scan he developed VT again requiring defibrillation. PCCM consulted for management  Significant events 10/5 Admitted, intubated, developed VT requiring defibrillation 10/6 Extubated, cardiology consulted.  Amiodarone stopped and started on metoprolol  Approaching readiness for discharge, but continues to have significant SOB and hypoxia with exertion.  Assessment & Plan:   Principal Problem:   Ventricular tachycardia (HCC) Active Problems:   Chest pain   Assessment and Plan: Acute on Chronic Hypoxic / Hypercarbic Respiratory Failure LUL Pulmonary Infiltrates / Multilobar Pneumonia Mild Acute Respiratory Acidosis  Hx of COPD Tobacco/Vape  Use Differential dx includes AECOPD, interstitial pneumonitis / inflammatory process, ILD, bacterial PNA / aspiration.  Ongoing tobacco use via vaping.  Continue brovana, pulmicort, yupelri  Finished 3 days of azithromycin.  s/p ceftriaxone x5 days, transition to amoxicillin.  On Prednisone 40 mg.  Can taper over the next week. Respiratory pathogen panel negative Nicotine patch  Wean down oxygen - on RA at rest today, had planned to discharge to home, but when up and ambulating, initially required 2 L to maintain sats, but when therapy worked with him subsequently, he satted 85% after walking on 3 L.  Considered discharging on 4 L with activity, but he developed significant SOB while walking on 4 L, suspect he'll need another couple days to allow lungs to recover.  Needs follow up imaging to ensure clearing of multilobar pneumonia/airspace disease   Ventricular Tachycardia s/p Defibrillation x2 - Appreciate Cardiology evaluation.  Abnormal rhythm was felt to be SVT with aberration and not true ventricular tachycardia - planning for event monitor at discharge - No indication for LHC at this time  - Beta blockers on hold due to bradycardia - cards planning outpatient follow up with [facility]   HFpEF CAD s/p CABG HTN Continue home plavix, eliquis Repeat echo shows EF 55-60% Beta blocker on hold in setting of brady   Pre-Diabetes SSI coverage   At Risk Malnutrition PO diet   Paroxsymal Atrial Fibrillation On Eliquis   HLD Continue home lipitor   PVD Outpt vascular f/u   Pulmonary nodules Outpatient follow up     DVT prophylaxis: eliquis Code Status: full Family Communication: none Disposition:  Status is: Inpatient Remains inpatient appropriate because: continued need for inpatient management   Consultants:  PCCM cardiology  Procedures:  Echo IMPRESSIONS     1. Left ventricular ejection fraction, by estimation, is 55 to 60%. The  left ventricle has  normal function. The left ventricle has no regional  wall motion abnormalities. Left ventricular diastolic parameters are  consistent with Grade I diastolic  dysfunction (impaired relaxation).   2. Right ventricular systolic function is normal. The right ventricular  size is normal. Tricuspid regurgitation signal is inadequate for assessing  PA pressure.   3. The mitral valve is normal in structure. Trivial mitral valve  regurgitation.   4. The aortic valve is tricuspid. There is mild calcification of the  aortic valve. There is mild thickening of the aortic valve. Aortic valve  regurgitation is not visualized. Aortic valve sclerosis/calcification is  present, without any evidence of  aortic stenosis.   5. The inferior vena cava is normal in size with greater than 50%  respiratory variability, suggesting right atrial pressure of 3 mmHg.   Comparison(s): No significant change from prior study.   Antimicrobials:  Anti-infectives (From admission, onward)    Start     Dose/Rate Route Frequency Ordered Stop   07/03/22 0800  azithromycin (ZITHROMAX) 500 mg in sodium chloride 0.9 % 250 mL IVPB        500 mg 250 mL/hr over 60 Minutes Intravenous Every 24 hours 07/03/22 0653 07/05/22 0853   07/03/22 0700  cefTRIAXone (ROCEPHIN) 2 g in sodium chloride 0.9 % 100 mL IVPB  Status:  Discontinued        2 g 200 mL/hr over 30 Minutes Intravenous Every 24 hours 07/03/22 0653 07/08/22 1557       Subjective: No complaints, was eager to d/c if possible  Objective: Vitals:   07/08/22 0754 07/08/22 0756 07/08/22 0827 07/08/22 1200  BP:      Pulse:      Resp:      Temp:      TempSrc:      SpO2: 95% 96% 90% 94%  Weight:      Height:        Intake/Output Summary (Last 24 hours) at 07/08/2022 1913 Last data filed at 07/08/2022 1100 Gross per 24 hour  Intake 100 ml  Output 1050 ml  Net -950 ml   Filed Weights   07/05/22 0500 07/07/22 0505 07/08/22 0416  Weight: 77.9 kg 74.6 kg 74.1 kg     Examination:  General: No acute distress. Cardiovascular: RRR Lungs: Clear to auscultation bilaterally  Abdomen: Soft, nontender, nondistended  Neurological: Alert and oriented 3. Moves all extremities 4 . Cranial nerves II through XII grossly intact.. Extremities: No clubbing or cyanosis. No edema.  Data Reviewed: I have personally reviewed following labs and imaging studies  CBC: Recent Labs  Lab 07/03/22 0612 07/03/22 0621 07/04/22 0540 07/05/22 0317 07/06/22 0157 07/07/22 0141 07/08/22 0108  WBC 19.6*  18.1*  --  17.1* 13.0* 12.5* 9.1 9.8  NEUTROABS 17.3*  --   --  10.7*  --   --  7.4  HGB 16.1  18.3*   < > 14.2 13.7 15.5 14.9 15.0  HCT 51.4  57.0*   < > 41.1 40.6 46.7 44.3 43.0  MCV 98.1  99.7  --  92.4 94.4 95.5 92.9 91.9  PLT 224  243  --  169 161 149* 141* 186   < > = values in this interval not displayed.  Basic Metabolic Panel: Recent Labs  Lab 07/04/22 0540 07/04/22 1615 07/05/22 0317 07/06/22 0157 07/07/22 0141 07/08/22 0108  NA 139  --  142 141 139 137  K 4.4  --  4.5 5.1 4.6 4.1  CL 100  --  103 105 103 100  CO2 27  --  _0 GLUCOSE 151*  --  113* 101* 130* 150*  BUN 33*  --  _1 CREATININE 1.32*  --  1.05 0.85 0.71 0.80  CALCIUM 8.9  --  8.7* 8.9 8.9 8.9  MG 2.2 2.3  --   --  2.0 2.0  PHOS 3.0 3.2  --   --   --  3.4    GFR: Estimated Creatinine Clearance: 80.8 mL/min (by C-G formula based on SCr of 0.8 mg/dL).  Liver Function Tests: Recent Labs  Lab 07/03/22 0612 07/05/22 0317 07/06/22 0157 07/08/22 0108  AST _2 ALT _3 ALKPHOS 87 58 59 59  BILITOT 0.5 0.2* 0.4 0.4  PROT 6.6 5.4* 5.7* 5.4*  ALBUMIN 4.1 2.9* 3.3* 3.1*    CBG: Recent Labs  Lab 07/08/22 0020 07/08/22 0420 07/08/22 0752 07/08/22 1145 07/08/22 1550  GLUCAP 155* 106* 109* 162* 137*     Recent Results (from the past 240 hour(s))  SARS Coronavirus 2 by RT PCR (hospital order, performed in [place] hospital  lab) *cepheid single result test* Anterior Nasal Swab     Status: None   Collection Time: 07/03/22  6:53 AM   Specimen: Anterior Nasal Swab  Result Value Ref Range Status   SARS Coronavirus 2 by RT PCR NEGATIVE NEGATIVE Final    Comment: (NOTE) SARS-CoV-2 target nucleic acids are NOT DETECTED.  The SARS-CoV-2 RNA is generally detectable in upper and lower respiratory specimens during the acute phase of infection. The lowest concentration of SARS-CoV-2 viral copies this assay can detect is 250 copies / mL. Lorrane Mccay negative result does not preclude SARS-CoV-2 infection and should not be used as the sole basis for treatment or other patient management decisions.  Cartier Mapel negative result may occur with improper specimen collection / handling, submission of specimen other than nasopharyngeal swab, presence of viral mutation(s) within the areas targeted by this assay, and inadequate number of viral copies (<250 copies / mL). Magen Suriano negative result must be combined with clinical observations, patient history, and epidemiological information.  Fact Sheet for Patients:   https://www.patel.info/  Fact Sheet for Healthcare Providers: https://hall.com/  This test is not yet approved or  cleared by the [place] FDA and has been authorized for detection and/or diagnosis of SARS-CoV-2 by FDA under an Emergency Use Authorization (EUA).  This EUA will remain in effect (meaning this test can be used) for the duration of the COVID-19 declaration under Section 564(b)(1) of the Act, 21 U.S.C. section 360bbb-3(b)(1), unless the authorization is terminated or revoked sooner.  Performed at [facility], [place] [address], [place], [place] [address]   Respiratory (~20 pathogens) panel by PCR     Status: None   Collection Time: 07/03/22  6:53 AM   Specimen: Anterior Nasal Swab; Respiratory  Result Value Ref Range Status   Adenovirus NOT DETECTED NOT DETECTED Final    Coronavirus 229E NOT DETECTED NOT DETECTED Final    Comment: (NOTE) The Coronavirus on the Respiratory Panel, DOES NOT test for the novel  Coronavirus (2019 nCoV)    Coronavirus HKU1 NOT DETECTED NOT DETECTED Final  Coronavirus NL63 NOT DETECTED NOT DETECTED Final   Coronavirus OC43 NOT DETECTED NOT DETECTED Final   Metapneumovirus NOT DETECTED NOT DETECTED Final   Rhinovirus / Enterovirus NOT DETECTED NOT DETECTED Final   Influenza Kimyata Milich NOT DETECTED NOT DETECTED Final   Influenza B NOT DETECTED NOT DETECTED Final   Parainfluenza Virus 1 NOT DETECTED NOT DETECTED Final   Parainfluenza Virus 2 NOT DETECTED NOT DETECTED Final   Parainfluenza Virus 3 NOT DETECTED NOT DETECTED Final   Parainfluenza Virus 4 NOT DETECTED NOT DETECTED Final   Respiratory Syncytial Virus NOT DETECTED NOT DETECTED Final   Bordetella pertussis NOT DETECTED NOT DETECTED Final   Bordetella Parapertussis NOT DETECTED NOT DETECTED Final   Chlamydophila pneumoniae NOT DETECTED NOT DETECTED Final   Mycoplasma pneumoniae NOT DETECTED NOT DETECTED Final    Comment: Performed at [facility], [place] [address] [place], [place] [address]  Culture, blood (Routine X 2) w Reflex to ID Panel     Status: None   Collection Time: 07/03/22  8:19 AM   Specimen: BLOOD  Result Value Ref Range Status   Specimen Description BLOOD RIGHT ANTECUBITAL  Final   Special Requests   Final    BOTTLES DRAWN AEROBIC AND ANAEROBIC Blood Culture adequate volume   Culture   Final    NO GROWTH 5 DAYS Performed at [facility], [place] [address], [place], [place] [address]    Report Status 07/08/2022 FINAL  Final  Culture, Respiratory w Gram Stain (tracheal aspirate)     Status: None   Collection Time: 07/03/22  8:19 AM   Specimen: Bronchoalveolar Lavage; Respiratory  Result Value Ref Range Status   Specimen Description BRONCHIAL ALVEOLAR LAVAGE  Final   Special Requests NONE  Final   Gram Stain   Final    RARE WBC PRESENT,  PREDOMINANTLY PMN NO ORGANISMS SEEN    Culture   Final    NO GROWTH 2 DAYS Performed at [place] [address] [place], [place] [address]    Report Status 07/05/2022 FINAL  Final  Culture, blood (Routine X 2) w Reflex to ID Panel     Status: None   Collection Time: 07/03/22  8:24 AM   Specimen: BLOOD RIGHT FOREARM  Result Value Ref Range Status   Specimen Description BLOOD RIGHT FOREARM  Final   Special Requests   Final    BOTTLES DRAWN AEROBIC ONLY Blood Culture adequate volume   Culture   Final    NO GROWTH 5 DAYS Performed at [facility], [place] [address], [place], [place] [address]    Report Status 07/08/2022 FINAL  Final  MRSA Next Gen by PCR, Nasal     Status: None   Collection Time: 07/03/22 10:12 AM   Specimen: Nasal Mucosa; Nasal Swab  Result Value Ref Range Status   MRSA by PCR Next Gen NOT DETECTED NOT DETECTED Final    Comment: (NOTE) The GeneXpert MRSA Assay (FDA approved for NASAL specimens only), is one component of Toshiro Hanken comprehensive MRSA colonization surveillance program. It is not intended to diagnose MRSA infection nor to guide or monitor treatment for MRSA infections. Test performance is not FDA approved in patients less than 34 years old. Performed at [facility], [place] [address] [place], [place] [address]   Culture, BAL-quantitative w Gram Stain     Status: Abnormal   Collection Time: 07/03/22 11:40 AM   Specimen: Bronchoalveolar Lavage; Tracheal  Result Value Ref  Range Status   Specimen Description BRONCHIAL ALVEOLAR LAVAGE  Final   Special Requests NONE  Final   Gram Stain   Final    RARE WBC PRESENT, PREDOMINANTLY PMN NO ORGANISMS SEEN    Culture (Maybelline Kolarik)  Final    10,000 COLONIES/mL Normal respiratory flora-no Staph aureus or Pseudomonas seen Performed at [facility], [address], [place], [place] [address]    Report Status 07/05/2022 FINAL  Final         Radiology Studies: No results  found.      Scheduled Meds:  apixaban  5 mg Oral BID   arformoterol  15 mcg Nebulization BID   atorvastatin  80 mg Oral q1800   budesonide (PULMICORT) nebulizer solution  0.5 mg Nebulization BID   clopidogrel  75 mg Oral QPM   insulin aspart  0-9 Units Subcutaneous Q4H   nicotine  21 mg Transdermal Daily   predniSONE  40 mg Oral Q breakfast   revefenacin  175 mcg Nebulization Daily   Continuous Infusions:  sodium chloride Stopped (07/05/22 0913)     LOS: 5 days    Time spent: over 30 min    Fayrene Helper, MD [facility]   To contact the attending provider between 7A-7P or the covering provider during after hours 7P-7A, please log into the web site [email or web address] and access using universal [place] password for that web site. If you do not have the password, please call the hospital operator.  07/08/2022, 7:13 PM

## 2022-07-08 NOTE — Progress Notes (Signed)
SATURATION QUALIFICATIONS: (This note is used to comply with regulatory documentation for home oxygen)  Patient Saturations on Room Air at Rest = 91%  Patient Saturations on Room Air while Ambulating = 77%  Patient Saturations on 2 Liters of oxygen while Ambulating = 91%

## 2022-07-09 DIAGNOSIS — I472 Ventricular tachycardia, unspecified: Secondary | ICD-10-CM | POA: Diagnosis not present

## 2022-07-09 LAB — GLUCOSE, CAPILLARY
Glucose-Capillary: 111 mg/dL — ABNORMAL HIGH (ref 70–99)
Glucose-Capillary: 116 mg/dL — ABNORMAL HIGH (ref 70–99)
Glucose-Capillary: 120 mg/dL — ABNORMAL HIGH (ref 70–99)
Glucose-Capillary: 135 mg/dL — ABNORMAL HIGH (ref 70–99)
Glucose-Capillary: 163 mg/dL — ABNORMAL HIGH (ref 70–99)
Glucose-Capillary: 179 mg/dL — ABNORMAL HIGH (ref 70–99)
Glucose-Capillary: 96 mg/dL (ref 70–99)

## 2022-07-09 NOTE — TOC Progression Note (Signed)
Transition of Care [facility]) - Progression Note    Patient Details  Name: Dennis Preston MRN: [id number] Date of Birth: 02-07-1951  Transition of Care [facility] CM/SW Contact  Graves-Bigelow, Ocie Cornfield, RN Phone Number: 07/09/2022, 3:57 PM  Clinical Narrative:  Oxygen 4 Liters ordered via [employer] for the patient on yesterday and was delivered to the room. Patient has CPAP in the home from [employer] as well. Case Manager will continue to follow for additional transition of care needs.   Expected Discharge Plan: Home/Self Care Barriers to Discharge: Continued Medical Work up  Expected Discharge Plan and Services Expected Discharge Plan: Home/Self Care   Discharge Planning Services: CM Consult Post Acute Care Choice: Durable Medical Equipment Living arrangements for the past 2 months: Single Family Home                 DME Arranged: Oxygen DME Agency: [employer]       HH Arranged: NA     Readmission Risk Interventions     No data to display

## 2022-07-09 NOTE — Progress Notes (Signed)
Progress Note  Patient: Dennis Preston [id number] DOB: 03-12-1951  DOA: 07/03/2022  DOS: 07/09/2022    Brief hospital course: Dennis Preston is a 71 y/o gentleman with a hx of CAD s/p CABG, HF, HTN, atrial fibrillation, tobacco abuse and etoh abuse who presented 10/5 with acute respiratory distress and SOB.    His wife reports he has been more short of breath over the last 3 days.  He has been vaping since April of this year.  He has been using his oxygen intermittently in the last few days and has had decreased activity tolerance.  With EMS he had saturations 72% on RA at home. Code STEMI was activated by EMS and he was given aspirin, NTG, duoneb, solumedrol. He had 2 runs of pulseless VT with EMS.  Upon arrival the ED he was evaluated by cardiology, who felt this was more likely repolarization abnormality, less likely a STEMI. He had a recent LHC with open grafts from his CABG other than occlusion of vein graft to obtuse marginal.  Due to worsening mental status and hypoxia he was intubated. CT scan to evaluate for aortic dissection was negative for dissection but demonstrated bilateral airspace disease with multilobar pneumonia and findings consistent with interstitial pneumonitis. During CT scan he developed VT again requiring defibrillation. PCCM consulted for management   Significant events 10/5 Admitted, intubated, developed VT requiring defibrillation 10/6 Extubated, cardiology consulted.  Amiodarone stopped and started on metoprolol   Approaching readiness for discharge, but continues to have significant SOB and hypoxia with exertion.  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  Ongoing tobacco use via vaping.  - Continue  brovana, pulmicort, yupelri  - Finished 3 days of azithromycin.  s/p ceftriaxone x5 days, transition to amoxicillin to complete 7 days Tx. - On Prednisone 40 mg. Plan 7 day taper. - Nicotine patch will be prescribed at discharge to facilitate cessation. Also recommend PCP follow up to this end. - Just sitting up for posterior lung auscultation SpO2 dropped into 80%'s with credible pleth and fair dyspnea. Will continue getting up OOB with supplemental oxygen and wean as tolerated.  - Needs follow up imaging to ensure clearing of multilobar pneumonia/airspace disease and for pulmonary nodules.    Ventricular Tachycardia s/p Defibrillation x2 - Appreciate Cardiology evaluation.  Abnormal rhythm was felt to be SVT with aberration and not true ventricular tachycardia - planning for event monitor at discharge - No indication for LHC at this time  - Beta blockers on hold due to bradycardia - cards planning outpatient follow up with [name], [name].   HFpEF CAD s/p CABG HTN Continue home plavix, eliquis Repeat echo shows EF 55-60% Beta blocker on hold in setting of brady   Pre-Diabetes SSI coverage   At Risk Malnutrition PO diet   Paroxsymal Atrial Fibrillation On Eliquis   HLD Continue home lipitor    PVD Outpt vascular f/u    Pulmonary nodules Outpatient follow up  Subjective: Shortness of breath at rest is continuing to improve. When mobilizing in bed gets OOB quickly which is not his baseline. No chest pain or wheezing.   Objective: Vitals:   07/09/22 0411 07/09/22 0757 07/09/22 0758 07/09/22 0915  BP: (!) 147/55   (!) 152/58  Pulse: (!) 49   63  Resp:  18  Temp: 97.7 F (36.5 C)   97.6 F (36.4 C)  TempSrc: Oral   Oral  SpO2: 95% 94% 94% 96%  Weight: 75.7 kg     Height:       Gen: 71 y.o. male in no distress Pulm: Nonlabored tachypnea with supplemental oxygen at rest, significantly diminished globally without wheezes or crackles. CV: Regular rate  and rhythm. No murmur, rub, or gallop. No JVD, no dependent edema. GI: Abdomen soft, non-tender, non-distended, with normoactive bowel sounds.  Ext: Warm, no deformities Skin: No rashes, lesions or ulcers on visualized skin. Neuro: Alert and oriented. No focal neurological deficits. Psych: Judgement and insight appear fair. Mood euthymic & affect congruent. Behavior is appropriate.    Data Personally reviewed: CBC: Recent Labs  Lab 07/03/22 0612 07/03/22 0621 07/04/22 0540 07/05/22 0317 07/06/22 0157 07/07/22 0141 07/08/22 0108  WBC 19.6*  18.1*  --  17.1* 13.0* 12.5* 9.1 9.8  NEUTROABS 17.3*  --   --  10.7*  --   --  7.4  HGB 16.1  18.3*   < > 14.2 13.7 15.5 14.9 15.0  HCT 51.4  57.0*   < > 41.1 40.6 46.7 44.3 43.0  MCV 98.1  99.7  --  92.4 94.4 95.5 92.9 91.9  PLT 224  243  --  169 161 149* 141* 186   < > = values in this interval not displayed.   Basic Metabolic Panel: Recent Labs  Lab 07/04/22 0540 07/04/22 1615 07/05/22 0317 07/06/22 0157 07/07/22 0141 07/08/22 0108  NA 139  --  142 141 139 137  K 4.4  --  4.5 5.1 4.6 4.1  CL 100  --  103 105 103 100  CO2 27  --  _0 GLUCOSE 151*  --  113* 101* 130* 150*  BUN 33*  --  _1 CREATININE 1.32*  --  1.05 0.85 0.71 0.80  CALCIUM 8.9  --  8.7* 8.9 8.9 8.9  MG 2.2 2.3  --   --  2.0 2.0  PHOS 3.0 3.2  --   --   --  3.4   GFR: Estimated Creatinine Clearance: 81.7 mL/min (by C-G formula based on SCr of 0.8 mg/dL). Liver Function Tests: Recent Labs  Lab 07/03/22 0612 07/05/22 0317 07/06/22 0157 07/08/22 0108  AST _2 ALT _3 ALKPHOS 87 58 59 59  BILITOT 0.5 0.2* 0.4 0.4  PROT 6.6 5.4* 5.7* 5.4*  ALBUMIN 4.1 2.9* 3.3* 3.1*   No results for input(s): "LIPASE", "AMYLASE" in the last 168 hours. No results for input(s): "AMMONIA" in the last 168 hours. Coagulation Profile: No results for input(s): "INR", "PROTIME" in the last 168 hours. Cardiac Enzymes: No results for  input(s): "CKTOTAL", "CKMB", "CKMBINDEX", "TROPONINI" in the last 168 hours. BNP (last 3 results) No results for input(s): "PROBNP" in the last 8760 hours. HbA1C: No results for input(s): "HGBA1C" in the last 72 hours. CBG: Recent Labs  Lab 07/09/22 0412 07/09/22 0816 07/09/22 1147 07/09/22 1153 07/09/22 1601  GLUCAP 111* 179* 96 116* 163*   Lipid Profile: No results for input(s): "CHOL", "HDL", "LDLCALC", "TRIG", "CHOLHDL", "LDLDIRECT" in the last 72 hours. Thyroid Function Tests: No results for input(s): "TSH", "T4TOTAL", "FREET4", "T3FREE", "THYROIDAB" in the last 72 hours. Anemia Panel: No results for input(s): "VITAMINB12", "FOLATE", "FERRITIN", "TIBC", "IRON", "RETICCTPCT" in the last 72 hours. Urine analysis:    Component Value Date/Time  COLORURINE YELLOW 07/03/2022 [place] 07/03/2022 0819   LABSPEC 1.025 07/03/2022 0819   PHURINE 6.0 07/03/2022 0819   GLUCOSEU NEGATIVE 07/03/2022 0819   HGBUR TRACE (A) 07/03/2022 [place] 07/03/2022 [place] 07/03/2022 0819   PROTEINUR 100 (A) 07/03/2022 0819   UROBILINOGEN 0.2 05/10/2008 1654   NITRITE NEGATIVE 07/03/2022 0819   LEUKOCYTESUR NEGATIVE 07/03/2022 0819   Recent Results (from the past 240 hour(s))  SARS Coronavirus 2 by RT PCR (hospital order, performed in [facility] hospital lab) *cepheid single result test* Anterior Nasal Swab     Status: None   Collection Time: 07/03/22  6:53 AM   Specimen: Anterior Nasal Swab  Result Value Ref Range Status   SARS Coronavirus 2 by RT PCR NEGATIVE NEGATIVE Final    Comment: (NOTE) SARS-CoV-2 target nucleic acids are NOT DETECTED.  The SARS-CoV-2 RNA is generally detectable in upper and lower respiratory specimens during the acute phase of infection. The lowest concentration of SARS-CoV-2 viral copies this assay can detect is 250 copies / mL. A negative result does not preclude SARS-CoV-2 infection and should not be used  as the sole basis for treatment or other patient management decisions.  A negative result may occur with improper specimen collection / handling, submission of specimen other than nasopharyngeal swab, presence of viral mutation(s) within the areas targeted by this assay, and inadequate number of viral copies (<250 copies / mL). A negative result must be combined with clinical observations, patient history, and epidemiological information.  Fact Sheet for Patients:   https://www.patel.info/  Fact Sheet for Healthcare Providers: https://hall.com/  This test is not yet approved or  cleared by the [place] FDA and has been authorized for detection and/or diagnosis of SARS-CoV-2 by FDA under an Emergency Use Authorization (EUA).  This EUA will remain in effect (meaning this test can be used) for the duration of the COVID-19 declaration under Section 564(b)(1) of the Act, 21 U.S.C. section 360bbb-3(b)(1), unless the authorization is terminated or revoked sooner.  Performed at [facility], [place] [address] [place], [place] [address]   Respiratory (~20 pathogens) panel by PCR     Status: None   Collection Time: 07/03/22  6:53 AM   Specimen: Anterior Nasal Swab; Respiratory  Result Value Ref Range Status   Adenovirus NOT DETECTED NOT DETECTED Final   Coronavirus 229E NOT DETECTED NOT DETECTED Final    Comment: (NOTE) The Coronavirus on the Respiratory Panel, DOES NOT test for the novel  Coronavirus (2019 nCoV)    Coronavirus HKU1 NOT DETECTED NOT DETECTED Final   Coronavirus NL63 NOT DETECTED NOT DETECTED Final   Coronavirus OC43 NOT DETECTED NOT DETECTED Final   Metapneumovirus NOT DETECTED NOT DETECTED Final   Rhinovirus / Enterovirus NOT DETECTED NOT DETECTED Final   Influenza A NOT DETECTED NOT DETECTED Final   Influenza B NOT DETECTED NOT DETECTED Final   Parainfluenza Virus 1 NOT DETECTED NOT DETECTED Final   Parainfluenza  Virus 2 NOT DETECTED NOT DETECTED Final   Parainfluenza Virus 3 NOT DETECTED NOT DETECTED Final   Parainfluenza Virus 4 NOT DETECTED NOT DETECTED Final   Respiratory Syncytial Virus NOT DETECTED NOT DETECTED Final   Bordetella pertussis NOT DETECTED NOT DETECTED Final   Bordetella Parapertussis NOT DETECTED NOT DETECTED Final   Chlamydophila pneumoniae NOT DETECTED NOT DETECTED Final   Mycoplasma pneumoniae NOT DETECTED NOT DETECTED Final    Comment: Performed at [facility],  [address], [place], [place] [address]  Culture, blood (Routine X 2) w Reflex to ID Panel     Status: None   Collection Time: 07/03/22  8:19 AM   Specimen: BLOOD  Result Value Ref Range Status   Specimen Description BLOOD RIGHT ANTECUBITAL  Final   Special Requests   Final    BOTTLES DRAWN AEROBIC AND ANAEROBIC Blood Culture adequate volume   Culture   Final    NO GROWTH 5 DAYS Performed at [facility], [place] [address], [place], [place] [address]    Report Status 07/08/2022 FINAL  Final  Culture, Respiratory w Gram Stain (tracheal aspirate)     Status: None   Collection Time: 07/03/22  8:19 AM   Specimen: Bronchoalveolar Lavage; Respiratory  Result Value Ref Range Status   Specimen Description BRONCHIAL ALVEOLAR LAVAGE  Final   Special Requests NONE  Final   Gram Stain   Final    RARE WBC PRESENT, PREDOMINANTLY PMN NO ORGANISMS SEEN    Culture   Final    NO GROWTH 2 DAYS Performed at [place] [address], [place], [place] [address]    Report Status 07/05/2022 FINAL  Final  Culture, blood (Routine X 2) w Reflex to ID Panel     Status: None   Collection Time: 07/03/22  8:24 AM   Specimen: BLOOD RIGHT FOREARM  Result Value Ref Range Status   Specimen Description BLOOD RIGHT FOREARM  Final   Special Requests   Final    BOTTLES DRAWN AEROBIC ONLY Blood Culture adequate volume   Culture   Final    NO GROWTH 5 DAYS Performed at [facility], [place] [address],  [place], [place] [address]    Report Status 07/08/2022 FINAL  Final  MRSA Next Gen by PCR, Nasal     Status: None   Collection Time: 07/03/22 10:12 AM   Specimen: Nasal Mucosa; Nasal Swab  Result Value Ref Range Status   MRSA by PCR Next Gen NOT DETECTED NOT DETECTED Final    Comment: (NOTE) The GeneXpert MRSA Assay (FDA approved for NASAL specimens only), is one component of a comprehensive MRSA colonization surveillance program. It is not intended to diagnose MRSA infection nor to guide or monitor treatment for MRSA infections. Test performance is not FDA approved in patients less than 25 years old. Performed at [facility], [place] [address] [place], [place] [address]   Culture, BAL-quantitative w Gram Stain     Status: Abnormal   Collection Time: 07/03/22 11:40 AM   Specimen: Bronchoalveolar Lavage; Tracheal  Result Value Ref Range Status   Specimen Description BRONCHIAL ALVEOLAR LAVAGE  Final   Special Requests NONE  Final   Gram Stain   Final    RARE WBC PRESENT, PREDOMINANTLY PMN NO ORGANISMS SEEN    Culture (A)  Final    10,000 COLONIES/mL Normal respiratory flora-no Staph aureus or Pseudomonas seen Performed at [place] [address], [place],  [address]    Report Status 07/05/2022 FINAL  Final     Disposition: Status is: Inpatient Remains inpatient appropriate because: Persistent new hypoxemia and severe dyspnea on exertion Planned Discharge Destination: Home  Patrecia Pour, MD 07/09/2022 5:44 PM Page by [email or web address]

## 2022-07-09 NOTE — Progress Notes (Signed)
Physical Therapy Treatment Patient Details Name: Dennis Preston MRN: [id number] DOB: 1951/09/04 Today's Date: 07/09/2022   History of Present Illness 71 yo male admitted 10/5 with SOB and pulseless VT's, requiring defibrillation x 2. Intubated 10/5-10/6. PMhx: COPD, AFib, PAD, CABG, CHF, HTN, HLD, Rt CEA    PT Comments    Pt with improved tolerance for ambulation and less DOE this session. Pt is able to ambulate for increased distances. SpO2 reading is often unreliable throughout session, with poor waveform when pt holding walker. When mobilizing on 3L [place] sats fluctuate wildly with poor waveform, recovering into low 90s rapidly with brief period of sitting. PT recommends discharge home with RW when medically ready.  Recommendations for follow up therapy are one component of a multi-disciplinary discharge planning process, led by the attending physician.  Recommendations may be updated based on patient status, additional functional criteria and insurance authorization.  Follow Up Recommendations  No PT follow up     Assistance Recommended at Discharge Intermittent Supervision/Assistance  Patient can return home with the following A little help with walking and/or transfers;Assistance with cooking/housework;Assist for transportation;Help with stairs or ramp for entrance;A little help with bathing/dressing/bathroom   Equipment Recommendations  Rolling walker (2 wheels)    Recommendations for Other Services       Precautions / Restrictions Precautions Precautions: Fall;Other (comment) Precaution Comments: watch sats Restrictions Weight Bearing Restrictions: No     Mobility  Bed Mobility Overal bed mobility: Modified Independent Bed Mobility: Supine to Sit     Supine to sit: Modified independent (Device/Increase time)     General bed mobility comments: increased time    Transfers Overall transfer level: Needs assistance Equipment used: Rolling walker (2  wheels) Transfers: Sit to/from Stand Sit to Stand: Supervision                Ambulation/Gait Ambulation/Gait assistance: Supervision Gait Distance (Feet): 150 Feet Assistive device: Rolling walker (2 wheels) Gait Pattern/deviations: Step-through pattern Gait velocity: reduced Gait velocity interpretation: 1.31 - 2.62 ft/sec, indicative of limited community ambulator   General Gait Details: slowed step-through gait   Stairs             Wheelchair Mobility    Modified Rankin (Stroke Patients Only)       Balance Overall balance assessment: Needs assistance Sitting-balance support: Feet supported, No upper extremity supported Sitting balance-Leahy Scale: Normal     Standing balance support: Single extremity supported, Reliant on assistive device for balance Standing balance-Leahy Scale: Poor                              Cognition Arousal/Alertness: Awake/alert Behavior During Therapy: WFL for tasks assessed/performed Overall Cognitive Status: Within Functional Limits for tasks assessed                                          Exercises      General Comments General comments (skin integrity, edema, etc.): pt on 2L [place] upon PT arrival, sats in low 90s. With initial mobility pt desats to 85%, although SpO2 reading often unreliable throughout session. PT increases supplemental oxygen to 3L, pt ambulates with reports of minimal SOB, sats ranging wildly during session. Upon return to seated position without grip of RW the pt's sats improve rapidly to 93% with good waveform on pulse ox. Pt weaned to  2L [place] at rest with sats in low 90s      Pertinent Vitals/Pain Pain Assessment Pain Assessment: No/denies pain    Home Living                          Prior Function            PT Goals (current goals can now be found in the care plan section) Acute Rehab PT Goals Patient Stated Goal: return home Progress towards PT  goals: Progressing toward goals    Frequency    Min 3X/week      PT Plan Current plan remains appropriate;Equipment recommendations need to be updated    Co-evaluation              AM-PAC PT "6 Clicks" Mobility   Outcome Measure  Help needed turning from your back to your side while in a flat bed without using bedrails?: None Help needed moving from lying on your back to sitting on the side of a flat bed without using bedrails?: None Help needed moving to and from a bed to a chair (including a wheelchair)?: A Little Help needed standing up from a chair using your arms (e.g., wheelchair or bedside chair)?: A Little Help needed to walk in hospital room?: A Little Help needed climbing 3-5 steps with a railing? : A Lot 6 Click Score: 19    End of Session Equipment Utilized During Treatment: Oxygen Activity Tolerance: Patient limited by fatigue Patient left: in chair;with call bell/phone within reach Nurse Communication: Mobility status PT Visit Diagnosis: Other abnormalities of gait and mobility (R26.89);Difficulty in walking, not elsewhere classified (R26.2)     Time: 1062-6948 PT Time Calculation (min) (ACUTE ONLY): 27 min  Charges:  $Gait Training: 8-22 mins $Therapeutic Activity: 8-22 mins                     Zenaida Niece, PT, DPT Acute Rehabilitation Office [place] 07/09/2022, 12:46 PM

## 2022-07-09 NOTE — Plan of Care (Signed)

## 2022-07-10 LAB — GLUCOSE, CAPILLARY
Glucose-Capillary: 104 mg/dL — ABNORMAL HIGH (ref 70–99)
Glucose-Capillary: 114 mg/dL — ABNORMAL HIGH (ref 70–99)
Glucose-Capillary: 170 mg/dL — ABNORMAL HIGH (ref 70–99)

## 2022-07-10 MED ORDER — NICOTINE 21 MG/24HR TD PT24: 21.0000 mg | MEDICATED_PATCH | Freq: Every day | TRANSDERMAL | 0 refills | Status: DC

## 2022-07-10 MED ORDER — PREDNISONE 10 MG PO TABS: ORAL_TABLET | ORAL | 0 refills | Status: AC

## 2022-07-10 NOTE — Discharge Summary (Signed)
Physician Discharge Summary   Patient: Dennis Preston MRN: [id number] DOB: 1951-04-22  Admit date:     07/03/2022  Discharge date: 07/10/22  Discharge Physician: Patrecia Pour   PCP: Tracie Harrier, MD   Recommendations at discharge:  Continue supplemental oxygen at home, steroid taper, avoidance of tobacco/vaping products, and repeat CT chest after recovery for nodules.  Follow up with [name], cardiology, to arrange cardiac monitoring as an outpatient. Note bradycardia with beta blockers. Follow up with PCP to continue counseling and mediation assistance for tobacco cessation. Prescribed nicotine patches at discharge. Follow up with vascular.  Discharge Diagnoses: Principal Problem:   Ventricular tachycardia [facility]) Active Problems:   Chest pain  Hospital Course: Dennis Preston is a 71 y/o gentleman with a hx of CAD s/p CABG, HF, HTN, atrial fibrillation, tobacco abuse and etoh abuse who presented 10/5 with acute respiratory distress and SOB.    His wife reports he has been more short of breath over the last 3 days.  He has been vaping since April of this year.  He has been using his oxygen intermittently in the last few days and has had decreased activity tolerance.  With EMS he had saturations 72% on RA at home. Code STEMI was activated by EMS and he was given aspirin, NTG, duoneb, solumedrol. He had 2 runs of pulseless VT with EMS.  Upon arrival the ED he was evaluated by cardiology, who felt this was more likely repolarization abnormality, less likely a STEMI. He had a recent LHC with open grafts from his CABG other than occlusion of vein graft to obtuse marginal.  Due to worsening mental status and hypoxia he was intubated. CT scan to evaluate for aortic dissection was negative for dissection but demonstrated bilateral airspace disease with multilobar pneumonia and findings consistent with interstitial pneumonitis. During CT scan he developed VT again requiring defibrillation. PCCM  consulted for management   Significant events 10/5 Admitted, intubated, developed VT requiring defibrillation 10/6 Extubated, cardiology consulted.  Amiodarone stopped and started on metoprolol. Continue improvement in hypoxemia and exertional dyspnea day over day. Now requiring only 2L with exertion with much less dyspnea, stable to continue recovery at home having completed antibiotics, continue steroid taper.  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  Ongoing tobacco use via vaping.  - Continue home trelegy, prn albuterol - Finished 3 days of azithromycin.  s/p ceftriaxone x5 days and 2 additional days of amoxicillin - On Prednisone 40 mg. Plan 7 day taper. - Nicotine patch will be prescribed at discharge to facilitate cessation. Also recommend PCP follow up to this end.  - Needs follow up imaging to ensure clearing of multilobar pneumonia/airspace disease and for pulmonary nodules.    Ventricular Tachycardia s/p Defibrillation x2 - Appreciate Cardiology evaluation.  Abnormal rhythm was felt to be SVT with aberrancy and not true ventricular tachycardia - planning for event monitor at discharge - No indication for LHC at this time  - Beta blockers on hold due to bradycardia when initiated. - Cards planning outpatient follow up with [facility] clinic, [name].   HFpEF CAD s/p CABG HTN Continue home plavix, eliquis Repeat echo shows EF 55-60% Beta blocker on hold in setting of brady   Pre-Diabetes: PCP follow up   Paroxsymal Atrial Fibrillation On Eliquis  HLD Continue home lipitor    PVD Outpt vascular f/u    Pulmonary nodules Outpatient follow up  AKI: Improved.   Consultants: Cardiology  Procedures  performed: None  Disposition: Home Diet recommendation:  Discharge Diet Orders (From admission, onward)     Start     Ordered   07/10/22 0000  Diet - low sodium heart healthy        07/10/22 0823           DISCHARGE MEDICATION: Allergies as of 07/10/2022       Reactions   Chlorhexidine         Medication List     STOP taking these medications    aspirin 81 MG chewable tablet       TAKE these medications    albuterol 108 (90 Base) MCG/ACT inhaler Commonly known as: VENTOLIN HFA Inhale 1-2 puffs into the lungs every 6 (six) hours as needed for wheezing or shortness of breath.   albuterol (2.5 MG/3ML) 0.083% nebulizer solution Commonly known as: PROVENTIL Take 3 mLs (2.5 mg total) by nebulization every 6 (six) hours as needed for wheezing or shortness of breath.   atorvastatin 80 MG tablet Commonly known as: LIPITOR Take 1 tablet (80 mg total) by mouth daily at 6 PM.   clopidogrel 75 MG tablet Commonly known as: PLAVIX Take 75 mg by mouth every evening.   Eliquis 5 MG Tabs tablet Generic drug: apixaban Take 5 mg by mouth 2 (two) times daily.   furosemide 40 MG tablet Commonly known as: LASIX Take 40 mg by mouth 2 (two) times daily.   losartan 25 MG tablet Commonly known as: COZAAR Take 25 mg by mouth daily.   nicotine 21 mg/24hr patch Commonly known as: NICODERM CQ - dosed in mg/24 hours Place 1 patch (21 mg total) onto the skin daily.   OXYGEN Inhale 2 L into the lungs daily as needed (activity).   predniSONE 10 MG tablet Commonly known as: DELTASONE Take 4 tablets (40 mg total) by mouth daily with breakfast for 1 day, THEN 3 tablets (30 mg total) daily with breakfast for 1 day, THEN 2 tablets (20 mg total) daily with breakfast for 1 day, THEN 1 tablet (10 mg total) daily with breakfast for 1 day. Start taking on: July 10, 2022   spironolactone 25 MG tablet Commonly known as: ALDACTONE Take 12.5 mg by mouth daily.   Trelegy Ellipta  100-62.5-25 MCG/ACT Aepb Generic drug: Fluticasone-Umeclidin-Vilant Inhale 1 puff into the lungs every other day.   zolpidem 5 MG tablet Commonly known as: AMBIEN Take 5 mg by mouth at bedtime as needed for sleep.        Follow-up Information     [facility] Follow up.   Why: Oxygen-portable tank to be delivered to the room. Contact information: [place] [address] [phone]         Tracie Harrier, MD Follow up.   Specialty: Internal Medicine Contact information: [address] [place] [address] [phone]         Yolonda Kida, MD. Schedule an appointment as soon as possible for a visit in 1 week(s).   Specialties: Cardiology, Internal Medicine Contact information: [place] [address] [phone]         Erby Pian, MD. Schedule an appointment as soon as possible for a visit.   Specialty: Specialist Contact information: [place] [address] [phone]                Discharge Exam: Filed Weights   07/08/22 0416 07/09/22 0411 07/10/22 0355  Weight: 74.1 kg 75.7 kg 73.7 kg  BP 130/60 (BP Location: Right Arm)   Pulse 72   Temp 98 F (36.7 C) (Oral)   Resp 18   Ht _0  (1.651 m)   Wt 73.7 kg   SpO2 99%   BMI 27.02 kg/m   No distress  Condition at discharge: stable  The results of significant diagnostics from this hospitalization (including imaging, microbiology, ancillary and laboratory) are listed below for reference.   Imaging Studies: ECHOCARDIOGRAM COMPLETE  Result Date: 07/04/2022    ECHOCARDIOGRAM REPORT   Patient Name:   Dennis Preston Date of Exam: 07/04/2022 Medical Rec #:  [id number]       Height:       67.0 in Accession #:    [id number]      Weight:       170.0 lb Date of Birth:  02-Dec-1950      BSA:          1.887 m Patient Age:    66 years        BP:            98/51 mmHg Patient Gender: M               HR:           91 bpm. Exam Location:  Inpatient Procedure: 2D Echo, Color Doppler and Cardiac Doppler Indications:    Acute Ischemic Heart Disease i24.9  History:        Patient has prior history of Echocardiogram examinations, most                 recent 02/17/2022. CAD, Prior CABG, COPD, Arrythmias:Atrial                 Fibrillation; Risk Factors:Hypertension and Dyslipidemia.  Sonographer:    Raquel Sarna Senior RDCS Referring Phys: [id number] Lily Kocher  Sonographer Comments: Technically difficult due to lung interference from COPD IMPRESSIONS  1. Left ventricular ejection fraction, by estimation, is 55 to 60%. The left ventricle has normal function. The left ventricle has no regional wall motion abnormalities. Left ventricular diastolic parameters are consistent with Grade I diastolic dysfunction (impaired relaxation).  2. Right ventricular systolic function is normal. The right ventricular size is normal. Tricuspid regurgitation signal is inadequate for assessing PA pressure.  3. The mitral valve is normal in structure. Trivial mitral valve regurgitation.  4. The aortic valve is tricuspid. There is mild calcification of the aortic valve. There is mild thickening of the aortic valve. Aortic valve regurgitation is not visualized. Aortic valve sclerosis/calcification is present, without any evidence of aortic stenosis.  5. The inferior vena cava is normal in size with greater than 50% respiratory variability, suggesting right atrial pressure of 3 mmHg. Comparison(s): No significant change from prior study. FINDINGS  Left Ventricle: Left ventricular ejection fraction, by estimation, is 55 to 60%. The left ventricle has normal function. The left ventricle has no regional wall motion abnormalities. The left ventricular internal cavity size was normal in size. There is  no left ventricular hypertrophy. Left ventricular diastolic parameters are consistent with Grade I diastolic  dysfunction (impaired relaxation). Right Ventricle: The right ventricular size is normal. No increase in right ventricular wall thickness. Right ventricular systolic function is normal. Tricuspid regurgitation signal is inadequate  for assessing PA pressure. Left Atrium: Left atrial size was normal in size. Right Atrium: Right atrial size was normal in size. Pericardium: There is no evidence of pericardial effusion. Mitral Valve: The mitral valve is normal in structure. Trivial mitral valve regurgitation. Tricuspid Valve: The tricuspid valve is normal in structure. Tricuspid valve regurgitation is trivial. Aortic Valve: The aortic valve is tricuspid. There is mild calcification of the aortic valve. There is mild thickening of the aortic valve. Aortic valve regurgitation is not visualized. Aortic valve sclerosis/calcification is present, without any evidence of aortic stenosis. Pulmonic Valve: The pulmonic valve was normal in structure. Pulmonic valve regurgitation is trivial. Aorta: The aortic root is normal in size and structure. Venous: The inferior vena cava is normal in size with greater than 50% respiratory variability, suggesting right atrial pressure of 3 mmHg. IAS/Shunts: The atrial septum is grossly normal.  LEFT VENTRICLE PLAX 2D LVIDd:         4.90 cm   Diastology LVIDs:         3.00 cm   LV e' medial:    7.51 cm/s LV PW:         1.00 cm   LV E/e' medial:  11.9 LV IVS:        0.70 cm   LV e' lateral:   12.20 cm/s LVOT diam:     2.20 cm   LV E/e' lateral: 7.3 LV SV:         78 LV SV Index:   41 LVOT Area:     3.80 cm  RIGHT VENTRICLE TAPSE (M-mode): 1.4 cm LEFT ATRIUM             Index        RIGHT ATRIUM           Index LA diam:        3.10 cm 1.64 cm/m   RA Area:     13.80 cm LA Vol (A2C):   48.1 ml 25.49 ml/m  RA Volume:   31.30 ml  16.59 ml/m LA Vol (A4C):   23.6 ml 12.51 ml/m LA Biplane Vol: 37.1 ml 19.66 ml/m  AORTIC VALVE LVOT Vmax:   109.00 cm/s LVOT Vmean:  69.500 cm/s LVOT VTI:    0.204 m   AORTA Ao Root diam: 3.00 cm MITRAL VALVE MV Area (PHT): 3.83 cm     SHUNTS MV Decel Time: 198 msec     Systemic VTI:  0.20 m MV E velocity: 89.10 cm/s   Systemic Diam: 2.20 cm MV A velocity: 133.00 cm/s MV E/A ratio:  0.67 Gwyndolyn Kaufman MD Electronically signed by Gwyndolyn Kaufman MD Signature Date/Time: 07/04/2022/3:33:43 PM    Final    DG Chest Port 1 View  Result Date: 07/04/2022 CLINICAL DATA:  Hypoxic acute ventilator dependent respiratory failure. EXAM: PORTABLE CHEST 1 VIEW COMPARISON:  Chest CT yesterday at 6:37 a.m. Portable chest yesterday at 6:24 a.m. FINDINGS: 5:01 a.m. ETT tip is 4 cm from the carina. NGT is within the stomach with the side hole and tip not filmed. A left subclavian artery stent is again noted as well as CABG change. Interstitial and patchy hazy infiltrates of left-greater-than-right lung fields are again noted, more widespread and confluent on the left. The left lung opacities are less dense today showing improvement. There is no new or worsening lung opacity. Trace pleural effusions are similar. The cardiac size is normal. There is aortic atherosclerosis with stable mediastinum. Background emphysematous disease. Osteopenia and degenerative change. IMPRESSION:  Left-greater-than-right bilateral airspace disease, on the left mildly improved, otherwise unchanged. Electronically Signed   By: Telford Nab M.D.   On: 07/04/2022 06:26   DG Abd 1 View  Result Date: 07/03/2022 CLINICAL DATA:  NG tube check EXAM: ABDOMEN - 1 VIEW COMPARISON:  CT 07/03/2022 FINDINGS: NG tube extends into stomach. The tube is looped once in the fundus with tip extending to the gastric antrum. IMPRESSION: NG tube with tip in the distal stomach. Electronically Signed   By: Suzy Bouchard M.D.   On: 07/03/2022 11:15   CT Angio Chest/Abd/Pel for Dissection W and/or Wo Contrast  Result Date: 07/03/2022 CLINICAL DATA:  Chest and back pain.  Aortic dissection suspected. EXAM: CT ANGIOGRAPHY CHEST,  ABDOMEN AND PELVIS TECHNIQUE: Non-contrast CT of the chest was initially obtained. Multidetector CT imaging through the chest, abdomen and pelvis was performed using the standard protocol during bolus administration of intravenous contrast. Multiplanar reconstructed images and MIPs were obtained and reviewed to evaluate the vascular anatomy. RADIATION DOSE REDUCTION: This exam was performed according to the departmental dose-optimization program which includes automated exposure control, adjustment of the mA and/or kV according to patient size and/or use of iterative reconstruction technique. CONTRAST:  47m OMNIPAQUE IOHEXOL 350 MG/ML SOLN COMPARISON:  Portable chest today, chest x-ray 02/17/2022, and chest CTs without contrast 01/03/2022 and 09/05/2021 FINDINGS: CTA CHEST FINDINGS Support tubes: ETT is in place with tip 4.4 cm from carina. NGT is coiled in the stomach with the tip in the antrum. Cardiovascular: The cardiac size is normal. Coronary arteries are heavily calcified and there is no pericardial effusion. Pulmonary arteries and veins are normal caliber. The pulmonary arteries are centrally clear. There are old CABG changes again noted. The thoracic aorta is within normal caliber limits with moderate to heavy mixed plaque throughout, additional plaques involving the great vessels greatest in the left subclavian artery with interval insertion of a 2.5 cm in length stent in the proximal left subclavian artery which appears grossly patent. There is a 40% mixed plaque origin stenosis of the right subclavian artery. There is no aortic stenosis, dissection or aneurysm. Mediastinum/Nodes: No enlarged mediastinal, hilar, or axillary lymph nodes. Thyroid gland, trachea, and esophagus demonstrate no significant findings. Lungs/Pleura: The lungs moderately emphysematous with centrilobular changes predominating. There are patchy alveolar infiltrates newly noted in the left upper lobe, increased interstitial and  ground-glass infiltrates in the right middle lobe and additional interstitial and alveolar disease in the lateral basal left lower lobe. There is increased interlobular septal thickening in the bases consistent with interstitial edema as well, versus interstitial pneumonitis and there are minimal pleural effusions. Diffuse bronchial thickening appears similar. There is fluid and debris in the left upper lobe lingular main bronchus. The last CT demonstrated a new 5 mm left upper lobe nodule anteriorly just above the level of the aortic arch. If this is still present it is obscured by airspace disease. There is a stable irregular 8 mm right upper lobe nodule on 7:38, stable rounded right upper lobe noncalcified 7 mm nodule on 7:32, stable 5 mm right lower lobe nodule on 7:97. Musculoskeletal: Osteopenia with degenerative changes of the spine. No acute or significant osseous findings. Review of the MIP images confirms the above findings. CTA ABDOMEN AND PELVIS FINDINGS VASCULAR Aorta: There is heavy mixed plaque causing up to 30% stenosis. Some of the soft plaque appears mildly ulcerative but no penetrating aortic ulcer is seen or dissection. No aneurysm. Celiac: Mixed plaques at the vessel origin causing  up to 60% stenosis 8 mm distal to the vessel origin. There are patchy calcifications and moderate stenosis in the splenic artery. The branch arteries otherwise are generally clear. SMA: There are patchy calcific plaques. The proximal 1.8 cm the vessel demonstrates a high-grade stenosis. The vessel opens up again up to the inflection point, distal to the inflection point a short-segment with up to 50% stenosis continues to the bifurcation with the bifurcation vessels patent. Renals: Both proximal renal arteries are heavily calcified. On the left this is associated with an 80% short-segment vessel stenosis just distal to an early bifurcation of the vessel. The smaller upper pole artery has a high-grade origin stenosis  and is otherwise clear. The larger artery with the 80% stenosis is otherwise clear. Despite the heavy calcifications there is no more than 50-60% stenosis in the proximal 2 cm of the vessel. The vessel is otherwise patent. IMA: 50% mixed plaque origin stenosis.  Otherwise clear. Inflow: There is heavy mixed plaque in both common iliac arteries with up to 50% irregular stenosis in the common iliacs. The left internal iliac artery occludes shortly distal to its origin and does not reconstitute. The right internal iliac artery is moderately stenotic but shows flow. There is irregular stenosis up to 60-70% and heavy calcification in the right external iliac artery. There is a long stent in the left external iliac artery, with a high-grade stenosis in the distal aspect of the stented vessel. Proximal outflow: The circumflex and deep femoral arteries both show flow but both proximal superficial femoral arteries are occluded and appear to have been previously stented. There is flow in both common femoral arteries. Veins: No obvious venous abnormality within the limitations of this arterial phase study. Review of the MIP images confirms the above findings. NON-VASCULAR Hepatobiliary: No focal liver abnormality is seen. No gallstones, gallbladder wall thickening, or biliary dilatation. Pancreas: No focal abnormality. Spleen: No focal abnormality. Adrenals/Urinary Tract: Slight nodular thickening of the adrenal glands is unchanged. There are scattered subcentimeter hypodensities in both kidneys which are too small to characterize. No follow-up imaging is recommended.JACR 2018 Feb; 264-273, Management of the Incidental RenalMass on CT, [employer] 2021; 814-848, Bosniak Classification of Cystic Renal Masses, Version 2019. There is no urinary stone or obstruction. There is no bladder thickening. Stomach/Bowel: There are thickened folds in the stomach, duodenal mid jejunum. There is no bowel obstruction or inflammatory change.  The appendix is normal caliber. There is diffuse colonic diverticulosis. No findings of acute diverticulitis. Lymphatic: No adenopathy is seen. Reproductive: There is no prostatomegaly. Other: Small umbilical and bilateral moderate-sized inguinal fat hernias. No incarcerated hernia. There is no free air, free fluid or free hemorrhage. Musculoskeletal: There is osteopenia, moderate dextrorotary scoliosis and advanced degenerative change of the lumbar spine, ankylosis across the collapsed disc of L2-3. Ankylosis of L2-3 facets. Review of the MIP images confirms the above findings. IMPRESSION: 1. No aortic dissection or aneurysm. 2. Heavy mixed plaques without critical aortic stenosis. 3. Heavy calcific CAD with old CABG. 4. Non-flow-limiting origin stenosis of the right subclavian artery, previous left subclavian artery and left external iliac artery stenting, and occluded stents in both proximal superficial femoral arteries. 5. Aortic visceral branch arterial stenoses, as described above, greatest involving the left lower pole artery, the hypoplastic left upper pole artery origin, and the SMA. 6. 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 7. Small lung nodules again noted.  Adhere to Lung-RADS guidelines. 8. Multifocal airspace disease consistent with multilobar pneumonia. Background  COPD. Follow-up to ensure clearing. 9. Interlobular septal thickening in the lung bases consistent with basilar edema or interstitial pneumonitis, with minimal pleural effusions. 10. Gastroenteritis without evidence of bowel obstruction or inflammation. 11. Diffuse diverticulosis. 12. Osteopenia and degenerative change with lumbar scoliosis. 13. Umbilical and bilateral inguinal fat hernias. Electronically Signed   By: Telford Nab M.D.   On: 07/03/2022 07:49   CT HEAD WO CONTRAST (5MM)  Result Date: 07/03/2022 CLINICAL DATA:  Chest pain and neuro deficit. EXAM: CT HEAD WITHOUT CONTRAST TECHNIQUE: Contiguous  axial images were obtained from the base of the skull through the vertex without intravenous contrast. RADIATION DOSE REDUCTION: This exam was performed according to the departmental dose-optimization program which includes automated exposure control, adjustment of the mA and/or kV according to patient size and/or use of iterative reconstruction technique. COMPARISON:  CT 02/17/2022 FINDINGS: Brain: There is mild cerebral atrophy with atrophic ventriculomegaly and mild-to-moderate small vessel disease of the cerebral white matter. Cerebellum and brainstem are unremarkable. No asymmetry is seen concerning for acute infarct, hemorrhage or mass. Basal cisterns are clear. There is no midline shift. Vascular: There are calcifications in the distal vertebral arteries and heavily in the bilateral siphons. No hyperdense central vessel is seen. Skull: Negative for fractures or focal lesions. Sinuses/Orbits: Unremarkable orbital contents and the orbital walls. Nasal septum deviates left. There is mild membrane disease in the paranasal sinuses without fluid levels or mastoid effusion. Other: None. IMPRESSION: Chronic changes. No acute intracranial CT findings or interval changes. Carotid atherosclerosis. Electronically Signed   By: Telford Nab M.D.   On: 07/03/2022 07:01   DG Chest Portable 1 View  Result Date: 07/03/2022 CLINICAL DATA:  71 year old male status post intubation. Code STEMI. EXAM: PORTABLE CHEST 1 VIEW COMPARISON:  Chest x-ray 02/17/2022. FINDINGS: An endotracheal tube is in place with tip 6.9 cm above the carina. A nasogastric tube is seen extending into the stomach, however, the tip of the nasogastric tube extends below the lower margin of the image. Lung volumes are normal. Widespread areas of interstitial prominence and patchy multifocal airspace consolidation noted in the lungs, most severe throughout the left mid to lower lung. No definite pleural effusions (left costophrenic sulcus is incompletely  imaged). No pneumothorax. No cephalization of the pulmonary vasculature. Heart size is normal. Upper mediastinal contours are within normal limits. Atherosclerosis in the thoracic aorta. Status post median sternotomy for CABG. IMPRESSION: 1. Support apparatus, as above. 2. Multifocal interstitial and airspace disease, most severe throughout the left mid to lower lung, concerning for severe multilobar bilateral bronchopneumonia. 3. Aortic atherosclerosis. Electronically Signed   By: Vinnie Langton M.D.   On: 07/03/2022 06:30    Microbiology: Results for orders placed or performed during the hospital encounter of 07/03/22  SARS Coronavirus 2 by RT PCR (hospital order, performed in [facility] hospital lab) *cepheid single result test* Anterior Nasal Swab     Status: None   Collection Time: 07/03/22  6:53 AM   Specimen: Anterior Nasal Swab  Result Value Ref Range Status   SARS Coronavirus 2 by RT PCR NEGATIVE NEGATIVE Final    Comment: (NOTE) SARS-CoV-2 target nucleic acids are NOT DETECTED.  The SARS-CoV-2 RNA is generally detectable in upper and lower respiratory specimens during the acute phase of infection. The lowest concentration of SARS-CoV-2 viral copies this assay can detect is 250 copies / mL. A negative result does not preclude SARS-CoV-2 infection and should not be used as the sole basis for treatment or other patient  management decisions.  A negative result may occur with improper specimen collection / handling, submission of specimen other than nasopharyngeal swab, presence of viral mutation(s) within the areas targeted by this assay, and inadequate number of viral copies (<250 copies / mL). A negative result must be combined with clinical observations, patient history, and epidemiological information.  Fact Sheet for Patients:   https://www.patel.info/  Fact Sheet for Healthcare Providers: https://hall.com/  This test is not yet  approved or  cleared by the [place] FDA and has been authorized for detection and/or diagnosis of SARS-CoV-2 by FDA under an Emergency Use Authorization (EUA).  This EUA will remain in effect (meaning this test can be used) for the duration of the COVID-19 declaration under Section 564(b)(1) of the Act, 21 U.S.C. section 360bbb-3(b)(1), unless the authorization is terminated or revoked sooner.  Performed at [facility], [place] [address], [place], [place] [address]   Respiratory (~20 pathogens) panel by PCR     Status: None   Collection Time: 07/03/22  6:53 AM   Specimen: Anterior Nasal Swab; Respiratory  Result Value Ref Range Status   Adenovirus NOT DETECTED NOT DETECTED Final   Coronavirus 229E NOT DETECTED NOT DETECTED Final    Comment: (NOTE) The Coronavirus on the Respiratory Panel, DOES NOT test for the novel  Coronavirus (2019 nCoV)    Coronavirus HKU1 NOT DETECTED NOT DETECTED Final   Coronavirus NL63 NOT DETECTED NOT DETECTED Final   Coronavirus OC43 NOT DETECTED NOT DETECTED Final   Metapneumovirus NOT DETECTED NOT DETECTED Final   Rhinovirus / Enterovirus NOT DETECTED NOT DETECTED Final   Influenza A NOT DETECTED NOT DETECTED Final   Influenza B NOT DETECTED NOT DETECTED Final   Parainfluenza Virus 1 NOT DETECTED NOT DETECTED Final   Parainfluenza Virus 2 NOT DETECTED NOT DETECTED Final   Parainfluenza Virus 3 NOT DETECTED NOT DETECTED Final   Parainfluenza Virus 4 NOT DETECTED NOT DETECTED Final   Respiratory Syncytial Virus NOT DETECTED NOT DETECTED Final   Bordetella pertussis NOT DETECTED NOT DETECTED Final   Bordetella Parapertussis NOT DETECTED NOT DETECTED Final   Chlamydophila pneumoniae NOT DETECTED NOT DETECTED Final   Mycoplasma pneumoniae NOT DETECTED NOT DETECTED Final    Comment: Performed at [facility], [place]. [address], [place], [place] [address]  Culture, blood (Routine X 2) w Reflex to ID Panel     Status: None   Collection Time:  07/03/22  8:19 AM   Specimen: BLOOD  Result Value Ref Range Status   Specimen Description BLOOD RIGHT ANTECUBITAL  Final   Special Requests   Final    BOTTLES DRAWN AEROBIC AND ANAEROBIC Blood Culture adequate volume   Culture   Final    NO GROWTH 5 DAYS Performed at [facility], [place] [address], [place], [place] [address]    Report Status 07/08/2022 FINAL  Final  Culture, Respiratory w Gram Stain (tracheal aspirate)     Status: None   Collection Time: 07/03/22  8:19 AM   Specimen: Bronchoalveolar Lavage; Respiratory  Result Value Ref Range Status   Specimen Description BRONCHIAL ALVEOLAR LAVAGE  Final   Special Requests NONE  Final   Gram Stain   Final    RARE WBC PRESENT, PREDOMINANTLY PMN NO ORGANISMS SEEN    Culture   Final    NO GROWTH 2 DAYS Performed at [facility] [address], [place], [place] [address]    Report Status 07/05/2022 FINAL  Final  Culture, blood (Routine X 2) w Reflex to ID Panel     Status: None   Collection Time: 07/03/22  8:24 AM   Specimen: BLOOD RIGHT FOREARM  Result Value Ref Range Status   Specimen Description BLOOD RIGHT FOREARM  Final   Special Requests   Final    BOTTLES DRAWN AEROBIC ONLY Blood Culture adequate volume   Culture   Final    NO GROWTH 5 DAYS Performed at [facility], [address] [place], [place] [address]    Report Status 07/08/2022 FINAL  Final  MRSA Next Gen by PCR, Nasal     Status: None   Collection Time: 07/03/22 10:12 AM   Specimen: Nasal Mucosa; Nasal Swab  Result Value Ref Range Status   MRSA by PCR Next Gen NOT DETECTED NOT DETECTED Final    Comment: (NOTE) The GeneXpert MRSA Assay (FDA approved for NASAL specimens only), is one component of a comprehensive MRSA colonization surveillance program. It is not intended to diagnose MRSA infection nor to guide or monitor treatment for MRSA infections. Test performance is not FDA approved in patients less than 18 years old. Performed at [facility], [place] [address] [place], [place] [address]   Culture, BAL-quantitative w Gram Stain     Status: Abnormal   Collection Time: 07/03/22 11:40 AM   Specimen: Bronchoalveolar Lavage; Tracheal  Result Value Ref Range Status   Specimen Description BRONCHIAL ALVEOLAR LAVAGE  Final   Special Requests NONE  Final   Gram Stain   Final    RARE WBC PRESENT, PREDOMINANTLY PMN NO ORGANISMS SEEN    Culture (A)  Final    10,000 COLONIES/mL Normal respiratory flora-no Staph aureus or Pseudomonas seen Performed at [facility] [address], [place], [place] [address]    Report Status 07/05/2022 FINAL  Final    Labs: CBC: Recent Labs  Lab 07/04/22 0540 07/05/22 0317 07/06/22 0157 07/07/22 0141 07/08/22 0108  WBC 17.1* 13.0* 12.5* 9.1 9.8  NEUTROABS  --  10.7*  --   --  7.4  HGB 14.2 13.7 15.5 14.9 15.0  HCT 41.1 40.6 46.7 44.3 43.0  MCV 92.4 94.4 95.5 92.9 91.9  PLT 169 161 149* 141* 371   Basic Metabolic Panel: Recent Labs  Lab 07/04/22 0540 07/04/22 1615 07/05/22 0317 07/06/22 0157 07/07/22 0141 07/08/22 0108  NA 139  --  142 141 139 137  K 4.4  --  4.5 5.1 4.6 4.1  CL 100  --  103 105 103 100  CO2 27  --  _0 GLUCOSE 151*  --  113* 101* 130* 150*  BUN 33*  --  _1 CREATININE 1.32*  --  1.05 0.85 0.71 0.80  CALCIUM 8.9  --  8.7* 8.9 8.9 8.9  MG 2.2 2.3  --   --  2.0 2.0  PHOS 3.0 3.2  --   --   --  3.4   Liver Function Tests: Recent Labs  Lab 07/05/22 0317 07/06/22 0157 07/08/22 0108  AST _2 ALT _3 ALKPHOS 58 59 59  BILITOT 0.2* 0.4 0.4  PROT 5.4* 5.7* 5.4*  ALBUMIN 2.9* 3.3* 3.1*   CBG: Recent Labs  Lab 07/09/22 1601 07/09/22 2038 07/10/22 0012 07/10/22 0352 07/10/22 0743  GLUCAP 163* 120* 114* 104* 170*    Discharge time spent: less than 30 minutes.  Signed: Patrecia Pour, MD [facility] 07/10/2022

## 2022-07-10 NOTE — Care Management Important Message (Signed)
Important Message  Patient Details  Name: Dennis Preston MRN: [id number] Date of Birth: [birth year]   Medicare Important Message Given:  Yes     Shelda Altes 07/10/2022, 10:35 AM

## 2022-07-21 ENCOUNTER — Other Ambulatory Visit: Payer: Self-pay | Admitting: Specialist

## 2022-07-21 DIAGNOSIS — R053 Chronic cough: Secondary | ICD-10-CM

## 2022-07-21 DIAGNOSIS — R918 Other nonspecific abnormal finding of lung field: Secondary | ICD-10-CM

## 2022-07-28 ENCOUNTER — Encounter (INDEPENDENT_AMBULATORY_CARE_PROVIDER_SITE_OTHER): Payer: Self-pay

## 2022-09-03 ENCOUNTER — Ambulatory Visit: Payer: Medicare Other | Attending: Specialist

## 2022-09-12 ENCOUNTER — Inpatient Hospital Stay (HOSPITAL_COMMUNITY)
Admission: EM | Admit: 2022-09-12 | Discharge: 2022-09-20 | DRG: 208 | Disposition: A | Discharge status: Home Health | Payer: Medicare Other | Attending: Family Medicine | Admitting: Family Medicine

## 2022-09-12 ENCOUNTER — Encounter (HOSPITAL_COMMUNITY)
Admission: EM | Disposition: A | Discharge status: Home Health | Payer: Self-pay | Source: Home / Self Care | Attending: Internal Medicine

## 2022-09-12 ENCOUNTER — Emergency Department (HOSPITAL_COMMUNITY): Payer: Medicare Other

## 2022-09-12 ENCOUNTER — Inpatient Hospital Stay (HOSPITAL_COMMUNITY): Payer: Medicare Other

## 2022-09-12 DIAGNOSIS — R7989 Other specified abnormal findings of blood chemistry: Secondary | ICD-10-CM | POA: Diagnosis not present

## 2022-09-12 DIAGNOSIS — I5031 Acute diastolic (congestive) heart failure: Secondary | ICD-10-CM

## 2022-09-12 DIAGNOSIS — I21A1 Myocardial infarction type 2: Secondary | ICD-10-CM | POA: Diagnosis present

## 2022-09-12 DIAGNOSIS — F419 Anxiety disorder, unspecified: Secondary | ICD-10-CM | POA: Diagnosis present

## 2022-09-12 DIAGNOSIS — J9690 Respiratory failure, unspecified, unspecified whether with hypoxia or hypercapnia: Secondary | ICD-10-CM | POA: Diagnosis present

## 2022-09-12 DIAGNOSIS — I447 Left bundle-branch block, unspecified: Secondary | ICD-10-CM | POA: Diagnosis present

## 2022-09-12 DIAGNOSIS — I252 Old myocardial infarction: Secondary | ICD-10-CM

## 2022-09-12 DIAGNOSIS — I5033 Acute on chronic diastolic (congestive) heart failure: Secondary | ICD-10-CM | POA: Diagnosis present

## 2022-09-12 DIAGNOSIS — I25700 Atherosclerosis of coronary artery bypass graft(s), unspecified, with unstable angina pectoris: Secondary | ICD-10-CM | POA: Diagnosis not present

## 2022-09-12 DIAGNOSIS — I700 Atherosclerosis of aorta: Secondary | ICD-10-CM | POA: Diagnosis present

## 2022-09-12 DIAGNOSIS — J440 Chronic obstructive pulmonary disease with acute lower respiratory infection: Secondary | ICD-10-CM | POA: Diagnosis present

## 2022-09-12 DIAGNOSIS — R001 Bradycardia, unspecified: Secondary | ICD-10-CM | POA: Diagnosis not present

## 2022-09-12 DIAGNOSIS — J849 Interstitial pulmonary disease, unspecified: Secondary | ICD-10-CM | POA: Diagnosis present

## 2022-09-12 DIAGNOSIS — F32A Depression, unspecified: Secondary | ICD-10-CM | POA: Diagnosis present

## 2022-09-12 DIAGNOSIS — E1151 Type 2 diabetes mellitus with diabetic peripheral angiopathy without gangrene: Secondary | ICD-10-CM | POA: Diagnosis present

## 2022-09-12 DIAGNOSIS — I480 Paroxysmal atrial fibrillation: Secondary | ICD-10-CM | POA: Diagnosis present

## 2022-09-12 DIAGNOSIS — Z9911 Dependence on respirator [ventilator] status: Secondary | ICD-10-CM | POA: Diagnosis not present

## 2022-09-12 DIAGNOSIS — R042 Hemoptysis: Secondary | ICD-10-CM | POA: Diagnosis not present

## 2022-09-12 DIAGNOSIS — J189 Pneumonia, unspecified organism: Secondary | ICD-10-CM | POA: Diagnosis not present

## 2022-09-12 DIAGNOSIS — N179 Acute kidney failure, unspecified: Secondary | ICD-10-CM | POA: Diagnosis present

## 2022-09-12 DIAGNOSIS — R000 Tachycardia, unspecified: Secondary | ICD-10-CM | POA: Diagnosis present

## 2022-09-12 DIAGNOSIS — J441 Chronic obstructive pulmonary disease with (acute) exacerbation: Principal | ICD-10-CM | POA: Diagnosis present

## 2022-09-12 DIAGNOSIS — I214 Non-ST elevation (NSTEMI) myocardial infarction: Secondary | ICD-10-CM | POA: Diagnosis not present

## 2022-09-12 DIAGNOSIS — G9341 Metabolic encephalopathy: Secondary | ICD-10-CM | POA: Diagnosis present

## 2022-09-12 DIAGNOSIS — I472 Ventricular tachycardia, unspecified: Secondary | ICD-10-CM | POA: Diagnosis not present

## 2022-09-12 DIAGNOSIS — I110 Hypertensive heart disease with heart failure: Secondary | ICD-10-CM | POA: Diagnosis not present

## 2022-09-12 DIAGNOSIS — Z720 Tobacco use: Secondary | ICD-10-CM | POA: Diagnosis not present

## 2022-09-12 DIAGNOSIS — Z1152 Encounter for screening for COVID-19: Secondary | ICD-10-CM | POA: Diagnosis not present

## 2022-09-12 DIAGNOSIS — I25118 Atherosclerotic heart disease of native coronary artery with other forms of angina pectoris: Secondary | ICD-10-CM | POA: Diagnosis not present

## 2022-09-12 DIAGNOSIS — I25110 Atherosclerotic heart disease of native coronary artery with unstable angina pectoris: Secondary | ICD-10-CM | POA: Diagnosis not present

## 2022-09-12 DIAGNOSIS — J9621 Acute and chronic respiratory failure with hypoxia: Secondary | ICD-10-CM | POA: Diagnosis present

## 2022-09-12 DIAGNOSIS — Z7902 Long term (current) use of antithrombotics/antiplatelets: Secondary | ICD-10-CM

## 2022-09-12 DIAGNOSIS — I25709 Atherosclerosis of coronary artery bypass graft(s), unspecified, with unspecified angina pectoris: Secondary | ICD-10-CM | POA: Diagnosis not present

## 2022-09-12 DIAGNOSIS — I471 Supraventricular tachycardia, unspecified: Secondary | ICD-10-CM | POA: Diagnosis not present

## 2022-09-12 DIAGNOSIS — I739 Peripheral vascular disease, unspecified: Secondary | ICD-10-CM | POA: Diagnosis not present

## 2022-09-12 DIAGNOSIS — Z95828 Presence of other vascular implants and grafts: Secondary | ICD-10-CM

## 2022-09-12 DIAGNOSIS — Z7951 Long term (current) use of inhaled steroids: Secondary | ICD-10-CM

## 2022-09-12 DIAGNOSIS — Z7901 Long term (current) use of anticoagulants: Secondary | ICD-10-CM

## 2022-09-12 DIAGNOSIS — E785 Hyperlipidemia, unspecified: Secondary | ICD-10-CM | POA: Diagnosis present

## 2022-09-12 DIAGNOSIS — Z888 Allergy status to other drugs, medicaments and biological substances status: Secondary | ICD-10-CM

## 2022-09-12 DIAGNOSIS — F1010 Alcohol abuse, uncomplicated: Secondary | ICD-10-CM | POA: Diagnosis present

## 2022-09-12 DIAGNOSIS — Z818 Family history of other mental and behavioral disorders: Secondary | ICD-10-CM

## 2022-09-12 DIAGNOSIS — Z9981 Dependence on supplemental oxygen: Secondary | ICD-10-CM

## 2022-09-12 DIAGNOSIS — J9601 Acute respiratory failure with hypoxia: Principal | ICD-10-CM

## 2022-09-12 DIAGNOSIS — F17210 Nicotine dependence, cigarettes, uncomplicated: Secondary | ICD-10-CM | POA: Diagnosis present

## 2022-09-12 DIAGNOSIS — J9622 Acute and chronic respiratory failure with hypercapnia: Secondary | ICD-10-CM | POA: Diagnosis not present

## 2022-09-12 DIAGNOSIS — Z79899 Other long term (current) drug therapy: Secondary | ICD-10-CM

## 2022-09-12 DIAGNOSIS — K219 Gastro-esophageal reflux disease without esophagitis: Secondary | ICD-10-CM | POA: Diagnosis present

## 2022-09-12 HISTORY — DX: Metabolic encephalopathy: G93.41

## 2022-09-12 LAB — CBC WITH DIFFERENTIAL/PLATELET
Abs Immature Granulocytes: 0.13 10*3/uL — ABNORMAL HIGH (ref 0.00–0.07)
Basophils Absolute: 0.1 10*3/uL (ref 0.0–0.1)
Basophils Relative: 1 %
Eosinophils Absolute: 1.5 10*3/uL — ABNORMAL HIGH (ref 0.0–0.5)
Eosinophils Relative: 7 %
HCT: 49.2 % (ref 39.0–52.0)
Hemoglobin: 16.3 g/dL (ref 13.0–17.0)
Immature Granulocytes: 1 %
Lymphocytes Relative: 18 %
Lymphs Abs: 3.7 10*3/uL (ref 0.7–4.0)
MCH: 31.4 pg (ref 26.0–34.0)
MCHC: 33.1 g/dL (ref 30.0–36.0)
MCV: 94.8 fL (ref 80.0–100.0)
Monocytes Absolute: 1.8 10*3/uL — ABNORMAL HIGH (ref 0.1–1.0)
Monocytes Relative: 9 %
Neutro Abs: 13.2 10*3/uL — ABNORMAL HIGH (ref 1.7–7.7)
Neutrophils Relative %: 64 %
Platelets: 316 10*3/uL (ref 150–400)
RBC: 5.19 MIL/uL (ref 4.22–5.81)
RDW: 13.9 % (ref 11.5–15.5)
WBC: 20.5 10*3/uL — ABNORMAL HIGH (ref 4.0–10.5)
nRBC: 0 % (ref 0.0–0.2)

## 2022-09-12 LAB — I-STAT CHEM 8, ED
BUN: 21 mg/dL (ref 8–23)
Calcium, Ion: 1.21 mmol/L (ref 1.15–1.40)
Chloride: 98 mmol/L (ref 98–111)
Creatinine, Ser: 1.3 mg/dL — ABNORMAL HIGH (ref 0.61–1.24)
Glucose, Bld: 261 mg/dL — ABNORMAL HIGH (ref 70–99)
HCT: 51 % (ref 39.0–52.0)
Hemoglobin: 17.3 g/dL — ABNORMAL HIGH (ref 13.0–17.0)
Potassium: 4.3 mmol/L (ref 3.5–5.1)
Sodium: 139 mmol/L (ref 135–145)
TCO2: 32 mmol/L (ref 22–32)

## 2022-09-12 LAB — I-STAT ARTERIAL BLOOD GAS, ED
Acid-Base Excess: 1 mmol/L (ref 0.0–2.0)
Acid-base deficit: 1 mmol/L (ref 0.0–2.0)
Bicarbonate: 31.3 mmol/L — ABNORMAL HIGH (ref 20.0–28.0)
Bicarbonate: 26.1 mmol/L (ref 20.0–28.0)
Calcium, Ion: 1.17 mmol/L (ref 1.15–1.40)
Calcium, Ion: 1.14 mmol/L — ABNORMAL LOW (ref 1.15–1.40)
HCT: 50 % (ref 39.0–52.0)
HCT: 45 % (ref 39.0–52.0)
Hemoglobin: 17 g/dL (ref 13.0–17.0)
Hemoglobin: 15.3 g/dL (ref 13.0–17.0)
O2 Saturation: 100 %
O2 Saturation: 94 %
Potassium: 5 mmol/L (ref 3.5–5.1)
Potassium: 4.8 mmol/L (ref 3.5–5.1)
Sodium: 134 mmol/L — ABNORMAL LOW (ref 135–145)
Sodium: 138 mmol/L (ref 135–145)
TCO2: 33 mmol/L — ABNORMAL HIGH (ref 22–32)
TCO2: 28 mmol/L (ref 22–32)
pCO2 arterial: 70.5 mmHg (ref 32–48)
pCO2 arterial: 52.8 mmHg — ABNORMAL HIGH (ref 32–48)
pH, Arterial: 7.255 — ABNORMAL LOW (ref 7.35–7.45)
pH, Arterial: 7.302 — ABNORMAL LOW (ref 7.35–7.45)
pO2, Arterial: 511 mmHg — ABNORMAL HIGH (ref 83–108)
pO2, Arterial: 79 mmHg — ABNORMAL LOW (ref 83–108)

## 2022-09-12 LAB — ECHOCARDIOGRAM COMPLETE
Area-P 1/2: 2.49 cm2
Calc EF: 65.8 %
Height: 65 in
Single Plane A2C EF: 69.3 %
Single Plane A4C EF: 63.7 %
Weight: 2366.86 oz

## 2022-09-12 LAB — RAPID URINE DRUG SCREEN, HOSP PERFORMED
Amphetamines: NOT DETECTED
Barbiturates: NOT DETECTED
Benzodiazepines: POSITIVE — AB
Cocaine: NOT DETECTED
Opiates: POSITIVE — AB
Tetrahydrocannabinol: NOT DETECTED

## 2022-09-12 LAB — COMPREHENSIVE METABOLIC PANEL
ALT: 20 U/L (ref 0–44)
AST: 22 U/L (ref 15–41)
Albumin: 4.2 g/dL (ref 3.5–5.0)
Alkaline Phosphatase: 65 U/L (ref 38–126)
Anion gap: 10 (ref 5–15)
BUN: 17 mg/dL (ref 8–23)
CO2: 31 mmol/L (ref 22–32)
Calcium: 9 mg/dL (ref 8.9–10.3)
Chloride: 99 mmol/L (ref 98–111)
Creatinine, Ser: 1.48 mg/dL — ABNORMAL HIGH (ref 0.61–1.24)
GFR, Estimated: 50 mL/min — ABNORMAL LOW (ref >60)
Glucose, Bld: 258 mg/dL — ABNORMAL HIGH (ref 70–99)
Potassium: 4.3 mmol/L (ref 3.5–5.1)
Sodium: 140 mmol/L (ref 135–145)
Total Bilirubin: 0.4 mg/dL (ref 0.3–1.2)
Total Protein: 6.7 g/dL (ref 6.5–8.1)

## 2022-09-12 LAB — LIPID PANEL
Cholesterol: 120 mg/dL (ref 0–200)
HDL: 56 mg/dL (ref >40)
LDL Cholesterol: 45 mg/dL (ref 0–99)
Total CHOL/HDL Ratio: 2.1 RATIO
Triglycerides: 96 mg/dL (ref <150)
VLDL: 19 mg/dL (ref 0–40)

## 2022-09-12 LAB — GLUCOSE, CAPILLARY
Glucose-Capillary: 138 mg/dL — ABNORMAL HIGH (ref 70–99)
Glucose-Capillary: 139 mg/dL — ABNORMAL HIGH (ref 70–99)
Glucose-Capillary: 146 mg/dL — ABNORMAL HIGH (ref 70–99)
Glucose-Capillary: 161 mg/dL — ABNORMAL HIGH (ref 70–99)

## 2022-09-12 LAB — RESP PANEL BY RT-PCR (RSV, FLU A&B, COVID)  RVPGX2
Influenza A by PCR: NEGATIVE
Influenza B by PCR: NEGATIVE
Resp Syncytial Virus by PCR: NEGATIVE
SARS Coronavirus 2 by RT PCR: NEGATIVE

## 2022-09-12 LAB — I-STAT VENOUS BLOOD GAS, ED
Acid-base deficit: 3 mmol/L — ABNORMAL HIGH (ref 0.0–2.0)
Bicarbonate: 30.5 mmol/L — ABNORMAL HIGH (ref 20.0–28.0)
Calcium, Ion: 1.19 mmol/L (ref 1.15–1.40)
HCT: 51 % (ref 39.0–52.0)
Hemoglobin: 17.3 g/dL — ABNORMAL HIGH (ref 13.0–17.0)
O2 Saturation: 96 %
Potassium: 4.3 mmol/L (ref 3.5–5.1)
Sodium: 139 mmol/L (ref 135–145)
TCO2: 33 mmol/L — ABNORMAL HIGH (ref 22–32)
pCO2, Ven: 93.6 mmHg (ref 44–60)
pH, Ven: 7.122 — CL (ref 7.25–7.43)
pO2, Ven: 112 mmHg — ABNORMAL HIGH (ref 32–45)

## 2022-09-12 LAB — TROPONIN I (HIGH SENSITIVITY)
Troponin I (High Sensitivity): 56 ng/L — ABNORMAL HIGH (ref <18)
Troponin I (High Sensitivity): 533 ng/L (ref <18)
Troponin I (High Sensitivity): 1925 ng/L (ref <18)
Troponin I (High Sensitivity): 2604 ng/L (ref <18)
Troponin I (High Sensitivity): 2773 ng/L (ref <18)

## 2022-09-12 LAB — HEPARIN LEVEL (UNFRACTIONATED): Heparin Unfractionated: >1.1 IU/mL — ABNORMAL HIGH (ref 0.30–0.70)

## 2022-09-12 LAB — APTT
aPTT: 28 seconds (ref 24–36)
aPTT: 66 seconds — ABNORMAL HIGH (ref 24–36)

## 2022-09-12 LAB — PROTIME-INR
INR: 1.3 — ABNORMAL HIGH (ref 0.8–1.2)
Prothrombin Time: 15.7 seconds — ABNORMAL HIGH (ref 11.4–15.2)

## 2022-09-12 LAB — MAGNESIUM: Magnesium: 2.1 mg/dL (ref 1.7–2.4)

## 2022-09-12 LAB — MRSA NEXT GEN BY PCR, NASAL: MRSA by PCR Next Gen: NOT DETECTED

## 2022-09-12 LAB — PHOSPHORUS: Phosphorus: 2 mg/dL — ABNORMAL LOW (ref 2.5–4.6)

## 2022-09-12 LAB — BRAIN NATRIURETIC PEPTIDE: B Natriuretic Peptide: 63 pg/mL (ref 0.0–100.0)

## 2022-09-12 LAB — LACTIC ACID, PLASMA
Lactic Acid, Venous: 2 mmol/L (ref 0.5–1.9)
Lactic Acid, Venous: 1.2 mmol/L (ref 0.5–1.9)

## 2022-09-12 SURGERY — LEFT HEART CATH AND CORONARY ANGIOGRAPHY
Anesthesia: LOCAL

## 2022-09-12 MED ORDER — SODIUM CHLORIDE 0.9 % IV SOLN: Freq: Once | INTRAVENOUS | Status: AC

## 2022-09-12 MED ORDER — FENTANYL CITRATE PF 50 MCG/ML IJ SOSY
25.0000 ug | PREFILLED_SYRINGE | Freq: Once | INTRAMUSCULAR | Status: AC
  Administered 2022-09-15: 25 ug via INTRAVENOUS
  Filled 2022-09-12: qty 1

## 2022-09-12 MED ORDER — ORAL CARE MOUTH RINSE
15.0000 mL | OROMUCOSAL | Status: DC
  Administered 2022-09-12 – 2022-09-14 (×21): 15 mL via OROMUCOSAL

## 2022-09-12 MED ORDER — PERFLUTREN LIPID MICROSPHERE
1.0000 mL | INTRAVENOUS | Status: AC | PRN
  Administered 2022-09-12: 2 mL via INTRAVENOUS

## 2022-09-12 MED ORDER — ALBUTEROL SULFATE (2.5 MG/3ML) 0.083% IN NEBU: 2.5000 mg | INHALATION_SOLUTION | Freq: Three times a day (TID) | RESPIRATORY_TRACT | Status: DC

## 2022-09-12 MED ORDER — ROCURONIUM BROMIDE 10 MG/ML (PF) SYRINGE
PREFILLED_SYRINGE | INTRAVENOUS | Status: AC
  Filled 2022-09-12: qty 10

## 2022-09-12 MED ORDER — POLYETHYLENE GLYCOL 3350 17 G PO PACK: 17.0000 g | PACK | Freq: Every day | ORAL | Status: DC | PRN

## 2022-09-12 MED ORDER — SODIUM CHLORIDE 0.9 % IV SOLN: INTRAVENOUS | Status: DC

## 2022-09-12 MED ORDER — METHYLPREDNISOLONE SODIUM SUCC 125 MG IJ SOLR
125.0000 mg | Freq: Once | INTRAMUSCULAR | Status: AC
  Administered 2022-09-12: 125 mg via INTRAVENOUS
  Filled 2022-09-12: qty 2

## 2022-09-12 MED ORDER — SODIUM CHLORIDE 0.9 % IV SOLN
2.0000 g | Freq: Once | INTRAVENOUS | Status: AC
  Administered 2022-09-12: 2 g via INTRAVENOUS
  Filled 2022-09-12: qty 12.5

## 2022-09-12 MED ORDER — MIDAZOLAM HCL 2 MG/2ML IJ SOLN
INTRAMUSCULAR | Status: AC
  Filled 2022-09-12: qty 2

## 2022-09-12 MED ORDER — FENTANYL 2500MCG IN NS 250ML (10MCG/ML) PREMIX INFUSION
INTRAVENOUS | Status: AC
  Filled 2022-09-12: qty 250

## 2022-09-12 MED ORDER — PROPOFOL 1000 MG/100ML IV EMUL
5.0000 ug/kg/min | INTRAVENOUS | Status: DC
  Administered 2022-09-12: 10 ug/kg/min via INTRAVENOUS
  Administered 2022-09-13: 25 ug/kg/min via INTRAVENOUS
  Administered 2022-09-13: 10 ug/kg/min via INTRAVENOUS
  Administered 2022-09-14: 30 ug/kg/min via INTRAVENOUS
  Filled 2022-09-12 (×2): qty 100
  Filled 2022-09-12: qty 200
  Filled 2022-09-12: qty 100

## 2022-09-12 MED ORDER — SODIUM CHLORIDE 0.9 % IV SOLN
2.0000 g | INTRAVENOUS | Status: AC
  Administered 2022-09-12 – 2022-09-16 (×5): 2 g via INTRAVENOUS
  Filled 2022-09-12 (×5): qty 20

## 2022-09-12 MED ORDER — KETAMINE HCL 50 MG/5ML IJ SOSY
PREFILLED_SYRINGE | INTRAMUSCULAR | Status: AC
  Filled 2022-09-12: qty 10

## 2022-09-12 MED ORDER — ALBUTEROL SULFATE (2.5 MG/3ML) 0.083% IN NEBU: 2.5000 mg | INHALATION_SOLUTION | RESPIRATORY_TRACT | Status: DC | PRN

## 2022-09-12 MED ORDER — VANCOMYCIN HCL 1500 MG/300ML IV SOLN
1500.0000 mg | Freq: Once | INTRAVENOUS | Status: AC
  Administered 2022-09-12: 1500 mg via INTRAVENOUS
  Filled 2022-09-12: qty 300

## 2022-09-12 MED ORDER — PANTOPRAZOLE SODIUM 40 MG PO TBEC: 40.0000 mg | DELAYED_RELEASE_TABLET | Freq: Every day | ORAL | Status: DC

## 2022-09-12 MED ORDER — ETOMIDATE 2 MG/ML IV SOLN: 20.0000 mg | Freq: Once | INTRAVENOUS | Status: AC

## 2022-09-12 MED ORDER — SUCCINYLCHOLINE CHLORIDE 200 MG/10ML IV SOSY: 150.0000 mg | PREFILLED_SYRINGE | Freq: Once | INTRAVENOUS | Status: AC

## 2022-09-12 MED ORDER — DOCUSATE SODIUM 50 MG/5ML PO LIQD: 100.0000 mg | Freq: Two times a day (BID) | ORAL | Status: DC | PRN

## 2022-09-12 MED ORDER — SODIUM CHLORIDE 0.9 % IV SOLN
500.0000 mg | INTRAVENOUS | Status: AC
  Administered 2022-09-12 – 2022-09-17 (×6): 500 mg via INTRAVENOUS
  Filled 2022-09-12 (×6): qty 5

## 2022-09-12 MED ORDER — FENTANYL 2500MCG IN NS 250ML (10MCG/ML) PREMIX INFUSION
25.0000 ug/h | INTRAVENOUS | Status: DC
  Administered 2022-09-12: 25 ug/h via INTRAVENOUS
  Administered 2022-09-12 – 2022-09-13 (×2): 200 ug/h via INTRAVENOUS
  Administered 2022-09-14: 150 ug/h via INTRAVENOUS
  Filled 2022-09-12 (×3): qty 250

## 2022-09-12 MED ORDER — ORAL CARE MOUTH RINSE: 15.0000 mL | OROMUCOSAL | Status: DC | PRN

## 2022-09-12 MED ORDER — PROPOFOL 1000 MG/100ML IV EMUL
INTRAVENOUS | Status: AC
  Administered 2022-09-12: 10 ug/kg/min via INTRAVENOUS
  Filled 2022-09-12: qty 100

## 2022-09-12 MED ORDER — SODIUM CHLORIDE 0.9 % IV SOLN
INTRAVENOUS | Status: DC
  Administered 2022-09-12: 10 mL via INTRAVENOUS

## 2022-09-12 MED ORDER — FENTANYL CITRATE PF 50 MCG/ML IJ SOSY
PREFILLED_SYRINGE | INTRAMUSCULAR | Status: AC
  Filled 2022-09-12: qty 2

## 2022-09-12 MED ORDER — ETOMIDATE 2 MG/ML IV SOLN
INTRAVENOUS | Status: AC
  Administered 2022-09-12: 20 mg via INTRAVENOUS
  Filled 2022-09-12: qty 20

## 2022-09-12 MED ORDER — CLOPIDOGREL BISULFATE 75 MG PO TABS
75.0000 mg | ORAL_TABLET | Freq: Every day | ORAL | Status: DC
  Administered 2022-09-12 – 2022-09-13 (×2): 75 mg
  Filled 2022-09-12 (×2): qty 1

## 2022-09-12 MED ORDER — HEPARIN (PORCINE) 25000 UT/250ML-% IV SOLN
950.0000 [IU]/h | INTRAVENOUS | Status: DC
  Administered 2022-09-12 – 2022-09-13 (×2): 900 [IU]/h via INTRAVENOUS
  Administered 2022-09-14 – 2022-09-15 (×2): 950 [IU]/h via INTRAVENOUS
  Filled 2022-09-12 (×4): qty 250

## 2022-09-12 MED ORDER — METHYLPREDNISOLONE SODIUM SUCC 40 MG IJ SOLR
40.0000 mg | Freq: Two times a day (BID) | INTRAMUSCULAR | Status: DC
  Administered 2022-09-12 – 2022-09-15 (×6): 40 mg via INTRAVENOUS
  Filled 2022-09-12 (×6): qty 1

## 2022-09-12 MED ORDER — ALBUTEROL SULFATE (2.5 MG/3ML) 0.083% IN NEBU
2.5000 mg | INHALATION_SOLUTION | RESPIRATORY_TRACT | Status: DC
  Administered 2022-09-12 (×3): 2.5 mg via RESPIRATORY_TRACT
  Filled 2022-09-12 (×2): qty 3

## 2022-09-12 MED ORDER — HEPARIN (PORCINE) 25000 UT/250ML-% IV SOLN: 14.0000 [IU]/kg/h | INTRAVENOUS | Status: DC

## 2022-09-12 MED ORDER — PANTOPRAZOLE SODIUM 40 MG IV SOLR
40.0000 mg | INTRAVENOUS | Status: DC
  Administered 2022-09-12 – 2022-09-15 (×4): 40 mg via INTRAVENOUS
  Filled 2022-09-12 (×4): qty 10

## 2022-09-12 MED ORDER — ATORVASTATIN CALCIUM 40 MG PO TABS
80.0000 mg | ORAL_TABLET | Freq: Every day | ORAL | Status: DC
  Administered 2022-09-12 – 2022-09-13 (×2): 80 mg
  Filled 2022-09-12 (×2): qty 2

## 2022-09-12 MED ORDER — FENTANYL BOLUS VIA INFUSION
25.0000 ug | INTRAVENOUS | Status: DC | PRN
  Administered 2022-09-12: 100 ug via INTRAVENOUS
  Administered 2022-09-12 – 2022-09-13 (×2): 50 ug via INTRAVENOUS
  Administered 2022-09-14: 75 ug via INTRAVENOUS

## 2022-09-12 MED ORDER — SUCCINYLCHOLINE CHLORIDE 200 MG/10ML IV SOSY
PREFILLED_SYRINGE | INTRAVENOUS | Status: AC
  Administered 2022-09-12: 150 mg via INTRAVENOUS
  Filled 2022-09-12: qty 10

## 2022-09-12 MED ORDER — DOCUSATE SODIUM 100 MG PO CAPS: 100.0000 mg | ORAL_CAPSULE | Freq: Two times a day (BID) | ORAL | Status: DC | PRN

## 2022-09-12 MED ORDER — PROSOURCE TF20 ENFIT COMPATIBL EN LIQD
60.0000 mL | Freq: Every day | ENTERAL | Status: DC
  Administered 2022-09-12 – 2022-09-13 (×2): 60 mL
  Filled 2022-09-12 (×2): qty 60

## 2022-09-12 MED ORDER — VITAL 1.5 CAL PO LIQD
1000.0000 mL | ORAL | Status: DC
  Administered 2022-09-12: 1000 mL

## 2022-09-12 MED ORDER — INSULIN ASPART 100 UNIT/ML IJ SOLN
0.0000 [IU] | INTRAMUSCULAR | Status: DC
  Administered 2022-09-12 (×2): 2 [IU] via SUBCUTANEOUS
  Administered 2022-09-13: 3 [IU] via SUBCUTANEOUS
  Administered 2022-09-13: 5 [IU] via SUBCUTANEOUS
  Administered 2022-09-13: 2 [IU] via SUBCUTANEOUS
  Administered 2022-09-13: 3 [IU] via SUBCUTANEOUS
  Administered 2022-09-13 – 2022-09-14 (×2): 2 [IU] via SUBCUTANEOUS
  Administered 2022-09-14: 5 [IU] via SUBCUTANEOUS
  Administered 2022-09-14: 2 [IU] via SUBCUTANEOUS
  Administered 2022-09-14 – 2022-09-16 (×3): 3 [IU] via SUBCUTANEOUS
  Administered 2022-09-17: 2 [IU] via SUBCUTANEOUS
  Administered 2022-09-17 (×2): 3 [IU] via SUBCUTANEOUS

## 2022-09-12 MED ORDER — NICOTINE 21 MG/24HR TD PT24
21.0000 mg | MEDICATED_PATCH | Freq: Every day | TRANSDERMAL | Status: DC
  Administered 2022-09-12 – 2022-09-20 (×8): 21 mg via TRANSDERMAL
  Filled 2022-09-12 (×10): qty 1

## 2022-09-12 NOTE — Procedures (Signed)
Arterial Catheter Insertion Procedure Note  Dennis Preston  [id number]  07-27-1951  Date:09/12/22  Time:8:26 AM    Provider Performing: Dewain Penning T    Procedure: Insertion of Arterial Line (67893) without US guidance  Indication(s) Blood pressure monitoring and/or need for frequent ABGs  Consent Unable to obtain consent due to emergent nature of procedure.  Anesthesia None   Time Out Verified patient identification, verified procedure, site/side was marked, verified correct patient position, special equipment/implants available, medications/allergies/relevant history reviewed, required imaging and test results available.   Sterile Technique Maximal sterile technique including full sterile barrier drape, hand hygiene, sterile gown, sterile gloves, mask, hair covering, sterile ultrasound probe cover (if used).   Procedure Description Area of catheter insertion was cleaned with chlorhexidine and draped in sterile fashion. Without real-time ultrasound guidance an arterial catheter was placed into the right radial artery.  Appropriate arterial tracings confirmed on monitor.     Complications/Tolerance None; patient tolerated the procedure well.   EBL Minimal   Specimen(s) None

## 2022-09-12 NOTE — ED Notes (Signed)
RT at bedside putting in a-line

## 2022-09-12 NOTE — ED Provider Notes (Signed)
[facility] EMERGENCY DEPARTMENT Provider Note   CSN: [id number] Arrival date & time: 09/12/22  1696     History  Chief Complaint  Patient presents with   Chest Pain   Shortness of Breath    Dennis Preston is a 71 y.o. male.  HPI     This is 71 year old male with history of COPD, coronary artery disease, CHF, A-fib who presents as a code STEMI.  EMS was called out to his home for severe chest pain and shortness of breath.  They noted that he was in significant respiratory distress.  O2 sats were in the 70s.  He did not tolerate BiPAP.  He had progressive altered mental status and was being bagged en route.  EKG showed wide-complex tachycardia with ST elevations.  Code STEMI was initiated.  EMS noted him to be pale and diaphoretic.  Level 5 caveat  Home Medications Prior to Admission medications   Medication Sig Start Date End Date Taking? Authorizing Provider  albuterol (PROVENTIL) (2.5 MG/3ML) 0.083% nebulizer solution Take 3 mLs (2.5 mg total) by nebulization every 6 (six) hours as needed for wheezing or shortness of breath. 02/15/20   Lurene Shadow, MD  albuterol (VENTOLIN HFA) 108 (90 Base) MCG/ACT inhaler Inhale 1-2 puffs into the lungs every 6 (six) hours as needed for wheezing or shortness of breath. 02/15/20   Lurene Shadow, MD  atorvastatin (LIPITOR) 80 MG tablet Take 1 tablet (80 mg total) by mouth daily at 6 PM. 07/10/15   Houston Siren, MD  clopidogrel (PLAVIX) 75 MG tablet Take 75 mg by mouth every evening.  04/18/16   [provider]  ELIQUIS 5 MG TABS tablet Take 5 mg by mouth 2 (two) times daily. 02/20/22   [provider]  Fluticasone-Umeclidin-Vilant (TRELEGY ELLIPTA) 100-62.5-25 MCG/INH AEPB Inhale 1 puff into the lungs every other day. 01/27/20   [provider]  furosemide (LASIX) 40 MG tablet Take 40 mg by mouth 2 (two) times daily. 12/26/19   [provider]  losartan (COZAAR) 25 MG tablet Take 25 mg by  mouth daily. 06/23/22   [provider]  nicotine (NICODERM CQ - DOSED IN MG/24 HOURS) 21 mg/24hr patch Place 1 patch (21 mg total) onto the skin daily. 07/10/22   Tyrone Nine, MD  OXYGEN Inhale 2 L into the lungs daily as needed (activity).    [provider]  spironolactone (ALDACTONE) 25 MG tablet Take 12.5 mg by mouth daily. 02/20/22   [provider]  zolpidem (AMBIEN) 5 MG tablet Take 5 mg by mouth at bedtime as needed for sleep. 04/10/21   [provider]      Allergies    Chlorhexidine    Review of Systems   Review of Systems  Unable to perform ROS: Acuity of condition    Physical Exam Updated Vital Signs BP 97/72   Pulse (!) 110   Resp (!) 24   Ht 1.651 m (5\' 5" )   Wt 74.4 kg   SpO2 100%   BMI 27.29 kg/m  Physical Exam Vitals and nursing note reviewed.  Constitutional:      Comments: Acutely ill-appearing, pale, diaphoretic, minimally responsive  HENT:     Head: Normocephalic and atraumatic.  Eyes:     Pupils: Pupils are equal, round, and reactive to light.  Cardiovascular:     Rate and Rhythm: Regular rhythm. Tachycardia present.     Heart sounds: Normal heart sounds. No murmur heard. Pulmonary:  Breath sounds: No wheezing.     Comments: Tachypnea, increased work of breathing, accessory muscle use, tight with very occasional wheeze Abdominal:     General: Bowel sounds are normal.     Palpations: Abdomen is soft.     Tenderness: There is no abdominal tenderness. There is no rebound.  Musculoskeletal:     Cervical back: Neck supple.     Right lower leg: No edema.     Left lower leg: No edema.  Lymphadenopathy:     Cervical: No cervical adenopathy.  Skin:    General: Skin is warm.  Neurological:     Mental Status: He is oriented to person, place, and time.     GCS: GCS eye subscore is 3. GCS verbal subscore is 1. GCS motor subscore is 4.     ED Results / Procedures / Treatments   Labs (all labs ordered are listed,  but only abnormal results are displayed) Labs Reviewed  RESP PANEL BY RT-PCR (RSV, FLU A&B, COVID)  [id number]  HEMOGLOBIN A1C  CBC WITH DIFFERENTIAL/PLATELET  PROTIME-INR  APTT  COMPREHENSIVE METABOLIC PANEL  LIPID PANEL  BRAIN NATRIURETIC PEPTIDE  I-STAT CHEM 8, ED  I-STAT ARTERIAL BLOOD GAS, ED  TROPONIN I (HIGH SENSITIVITY)    EKG EKG Interpretation  Date/Time:  Friday September 12 2022 07:00:45 EST Ventricular Rate:  110 PR Interval:  162 QRS Duration: 109 QT Interval:  291 QTC Calculation: 394 R Axis:   109 Text Interpretation: Sinus tachycardia Right axis deviation RSR' in V1 or V2, probably normal variant Repol abnrm, severe global ischemia (LM/MVD) Baseline wander in lead(s) V5 Global ST changes Confirmed by Ross Marcus ([id number]) on 09/12/2022 7:13:50 AM  Radiology No results found.  Procedures .Critical Care  Performed by: Shon Baton, MD Authorized by: Shon Baton, MD   Critical care provider statement:    Critical care time (minutes):  45   Critical care was necessary to treat or prevent imminent or life-threatening deterioration of the following conditions:  Respiratory failure   Critical care was time spent personally by me on the following activities:  Development of treatment plan with patient or surrogate, discussions with consultants, evaluation of patient's response to treatment, examination of patient, ordering and review of laboratory studies, ordering and review of radiographic studies, ordering and performing treatments and interventions, pulse oximetry, re-evaluation of patient's condition and review of old charts Procedure Name: Intubation Date/Time: 09/12/2022 7:51 AM  Performed by: Shon Baton, MDOxygen Delivery Method: Non-rebreather mask Preoxygenation: Pre-oxygenation with 100% oxygen Induction Type: Rapid sequence Laryngoscope Size: Glidescope and 4 Grade View: Grade I Tube size: 8.0 mm        Medications Ordered  in ED Medications  rocuronium bromide 100 MG/10ML SOSY (  Not Given 09/12/22 0706)  propofol (DIPRIVAN) 1000 MG/100ML infusion (has no administration in time range)  0.9 %  sodium chloride infusion (has no administration in time range)  methylPREDNISolone sodium succinate (SOLU-MEDROL) 125 mg/2 mL injection 125 mg (has no administration in time range)  heparin ADULT infusion 100 units/mL (25000 units/266mL) (has no administration in time range)  etomidate (AMIDATE) injection 20 mg (20 mg Intravenous Given 09/12/22 0654)  succinylcholine (ANECTINE) syringe 150 mg (150 mg Intravenous Given 09/12/22 0655)    ED Course/ Medical Decision Making/ A&P Clinical Course as of 09/12/22 2304  Fri Sep 12, 2022  0739 Nursing advised to replace NG tube as it is coiled in the esophagus. [CH]    Clinical Course User Index [CH]  Azahel Belcastro, Mayer Maskerourtney F, MD                           Medical Decision Making Amount and/or Complexity of Data Reviewed Labs: ordered. Radiology: ordered.  Risk Prescription drug management. Decision regarding hospitalization.   This patient presents to the ED for concern of code STEMI, this involves an extensive number of treatment options, and is a complaint that carries with it a high risk of complications and morbidity.  I considered the following differential and admission for this acute, potentially life threatening condition.  The differential diagnosis includes primary ACS, arrhythmia, primary respiratory etiology  MDM:    This is a 71 year old male with a significant history of COPD and CAD who presents as a code STEMI.  Most significantly he appears in respiratory distress.  He is somnolent and minimally arousable.  Will only open his eyes to voice.  He does not follow commands.  His EKG has some diffuse changes but he is significantly tachycardic.  Cardiology is at the bedside.  Patient was intubated upon arrival given his mental status and respiratory status.   Intubation without difficulty.  Immediately his heart rate improved.  Repeat EKG shows global ischemic changes.  Bedside EF by cardiology estimated to be 40%.  Code STEMI was canceled.  Felt that his EKG changes were likely driven by his respiratory status.  They will leave a note.  Labs and chest x-ray are pending.  Discussed with critical care who will assess the patient.  Highly suspect that this is a respiratory driven event given his significant history of COPD.  (Labs, imaging, consults)  Labs: I Ordered, and personally interpreted labs.  The pertinent results include: pH 7.25, PCO2 70.5, CBC with white count of 20.  Blood cultures, lactate, antibiotics added to cover for pneumonia.  Cefepime and Vanco.  Imaging Studies ordered: I ordered imaging studies including chest x-ray with good tube placement, enteric tube to be readjusted. I independently visualized and interpreted imaging. I agree with the radiologist interpretation  Additional history obtained from chart review.  External records from outside source obtained and reviewed including outpatient pulmonology and cardiology evaluations as well as recent admission for similar presentation  Cardiac Monitoring: The patient was maintained on a cardiac monitor.  I personally viewed and interpreted the cardiac monitored which showed an underlying rhythm of: Sinus tachycardia  Reevaluation: After the interventions noted above, I reevaluated the patient and found that they have :stayed the same  Social Determinants of Health:  lives with wife  Disposition: Admit  Co morbidities that complicate the patient evaluation  Past Medical History:  Diagnosis Date   Alcohol abuse    Amaurosis fugax    Aortic atherosclerosis (HCC)    Atrial fibrillation (HCC)    Carotid stenosis    CHF (congestive heart failure) (HCC)    Coagulopathy (HCC)    COPD (chronic obstructive pulmonary disease) (HCC)    Coronary artery disease    Depression     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, SVG-OM1, SVG-PDA, SVG-PL   Hyperlipemia    Hypertension    Leucocytosis    Liver dysfunction    Long term current use of clopidogrel    NSTEMI (non-ST elevated myocardial infarction) (HCC)    approx 2016   Peripheral artery disease (HCC)    legs   Tobacco abuse    Wears dentures    full upper and lower     Medicines  Meds ordered this encounter  Medications   DISCONTD: ketamine HCl 50 MG/5ML SOSY    Toler, Linda: cabinet override   rocuronium bromide 100 MG/10ML SOSY    Toler, Linda: cabinet override   succinylcholine (ANECTINE) 200 MG/10ML syringe    Toler, Linda: cabinet override   DISCONTD: midazolam (VERSED) 2 MG/2ML injection    Toler, Linda: cabinet override   etomidate (AMIDATE) 2 MG/ML injection    Toler, Linda: cabinet override   DISCONTD: fentaNYL (SUBLIMAZE) 50 MCG/ML injection    Toler, Linda: cabinet override   propofol (DIPRIVAN) 1000 MG/100ML infusion    Toler, Linda: cabinet override   0.9 %  sodium chloride infusion   methylPREDNISolone sodium succinate (SOLU-MEDROL) 125 mg/2 mL injection 125 mg    IV methylprednisolone will be converted to either a q12h or q24h frequency with the same total daily dose (TDD).  Ordered Dose: 1 to 125 mg TDD; convert to: TDD q24h.  Ordered Dose: 126 to 250 mg TDD; convert to: TDD div q12h.  Ordered Dose: >250 mg TDD; DAW.   heparin ADULT infusion 100 units/mL (25000 units/211mL)   etomidate (AMIDATE) injection 20 mg   succinylcholine (ANECTINE) syringe 150 mg    I have reviewed the patients home medicines and have made adjustments as needed  Problem List / ED Course: Problem List Items Addressed This Visit       Respiratory   Acute respiratory failure (HCC) - Primary                Final Clinical Impression(s) / ED Diagnoses Final diagnoses:  Acute respiratory failure with hypoxia [facility])    Rx / DC Orders ED Discharge Orders     None         Shon Baton, MD 09/12/22 2304

## 2022-09-12 NOTE — ED Notes (Signed)
Lab results was reported to Nurse Turkey.

## 2022-09-12 NOTE — H&P (Addendum)
NAME:  SHAHMEER BUNN, MRN:  [id number], DOB:  [birth year], LOS: 0 ADMISSION DATE:  09/12/2022, CONSULTATION DATE: 09/12/2022 REFERRING MD: Emergency department physician, CHIEF COMPLAINT: Hypoxic respiratory failure, coronary artery disease, wide-complex tachycardia  History of Present Illness:  Dennis Preston 71 year old male with extensive past medical history is well-documented below.  Of note he stopped smoking and drinking for the last several months.  He was admitted to medicine hospital in October for similar episode.  He has been at [facility] for subclavian steal with stent placement.  EMS was notified due to shortness of breath decreased level of consciousness while in the ambulance he required bagging to maintain adequate ventilation on arrival to [facility] emergency department he was intubated.  He did have a wide-complex tachycardia that is now resolved.  He had bedside evaluation by cardiology he may or may not go to Cath Lab.  We will admit to the intensive care unit at this time.  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, SVG-OM1, SVG-PDA, SVG-PL   Hyperlipemia    Hypertension    Leucocytosis    Liver dysfunction    Long term current use of clopidogrel    NSTEMI (non-ST elevated myocardial infarction) (HCC)    approx 2016   Peripheral artery disease (HCC)    legs   Tobacco abuse    Wears dentures    full upper and lower     Significant Hospital Events: Including procedures, antibiotic start and stop dates in addition to other pertinent events   09/12/2022 intubated Emergency Department  Interim  History / Subjective:  Arrived with acute respiratory distress wide-complex tachycardia intubated emergency department  Objective   Blood pressure 115/71, pulse (!) 103, resp. rate 18, height 5\' 5"  (1.651 m), weight 74.4 kg, SpO2 96 %.    Vent Mode: PRVC FiO2 (%):  [100 %] 100 % Set Rate:  [24 bmp] 24 bmp Vt Set:  [490 mL] 490 mL PEEP:  [5 cmH20] 5 cmH20 Plateau Pressure:  [27 cmH20] 27 cmH20  No intake or output data in the 24 hours ending 09/12/22 0756 Filed Weights   09/12/22 0701  Weight: 74.4 kg    Examination: General: Elderly male who is sedated on full mechanical ventilatory support currently utilizing propofol and fentanyl HENT: Endotracheal tube is in place orogastric tube is in place no JVD is appreciated Lungs: Coarse rhonchi with expiratory wheezes noted Cardiovascular: Heart sounds are regular with a narrow complex tachycardia Abdomen: Soft nontender positive bowel sounds Extremities: 1+ edema Neuro: Currently sedated with propofol and fentanyl does arouse to noxious stimuli cough and gag reflex are in place GU: Foley catheter is to be placed  Resolved Hospital Problem list     Assessment & Plan:  Ventilator dependent respiratory failure in the setting of wide-complex tachycardia coupled with O2 dependent COPD. Adjustments made to the ventilator ABG is ordered No air trapping Continue ventilator support until stabilized Steroids Bronchodilators Admit to the intensive care unit questionable cardiac cath this admission  Wide-complex tachycardia evaluated by cardiology at bedside.  Bedside 2D echo revealed EF of 40% this is in the setting of known congestive heart failure and coronary artery disease he is followed at [facility] he has a history of subclavian steal with stent and was recently admitted to [facility] for similar episode.  History of atrial fibrillation Heparin drip To maintain adequate blood pressure Admit to the intensive  care unit Cardiology is following  O2 dependent COPD he is followed at the [facility] clinic in [place] [facility] O2 as needed to keep sats greater than 88% Steroids Bronchodilators Ongoing evaluation He may need to be difficult to get off ventilator  Best Practice (right click and "Reselect all SmartList Selections" daily)   Diet/type: NPO DVT prophylaxis: systemic heparin GI prophylaxis: PPI Lines: N/A Foley:  N/A Code Status:  full code Last date of multidisciplinary goals of care discussion [tbd] 09/12/2022 0800 hrs. wife and son updated at bedside.  Code discussion started at this time they want full code.  They will discuss should the patient survive with or not to redo this in the future. Labs   CBC: Recent Labs  Lab 09/12/22 0656 09/12/22 0712 09/12/22 0719  WBC 20.5*  --   --   NEUTROABS 13.2*  --   --   HGB 16.3 17.3*  17.3* 17.0  HCT 49.2 51.0  51.0 50.0  MCV 94.8  --   --   PLT 316  --   --     Basic Metabolic Panel: Recent Labs  Lab 09/12/22 0712 09/12/22 0719  NA 139  139 134*  K 4.3  4.3 5.0  CL 98  --   GLUCOSE 261*  --   BUN 21  --   CREATININE 1.30*  --    GFR: Estimated Creatinine Clearance: 49.2 mL/min (A) (by C-G formula based on SCr of 1.3 mg/dL (H)). Recent Labs  Lab 09/12/22 0656  WBC 20.5*    Liver Function Tests: No results for input(s): "AST", "ALT", "ALKPHOS", "BILITOT", "PROT", "ALBUMIN" in the last 168 hours. No results for input(s): "LIPASE", "AMYLASE" in the last 168 hours. No results for input(s): "AMMONIA" in the last 168 hours.  ABG    Component Value Date/Time   PHART 7.255 (L) 09/12/2022 0719   PCO2ART 70.5 (HH) 09/12/2022 0719   PO2ART 511 (H) 09/12/2022 0719   HCO3 31.3 (H) 09/12/2022 0719   TCO2 33 (H) 09/12/2022 0719   ACIDBASEDEF 3.0 (H) 09/12/2022 0712   O2SAT 100 09/12/2022 0719     Coagulation Profile: Recent Labs  Lab 09/12/22 0656  INR 1.3*    Cardiac Enzymes: No results for  input(s): "CKTOTAL", "CKMB", "CKMBINDEX", "TROPONINI" in the last 168 hours.  HbA1C: Hgb A1c MFr Bld  Date/Time Value Ref Range Status  02/17/2022 08:57 AM 5.9 (H) 4.8 - 5.6 % Final    Comment:    (NOTE) Pre diabetes:          5.7%-6.4%  Diabetes:              >6.4%  Glycemic control for   <7.0% adults with diabetes   08/27/2006 12:00 AM 5.8 %     CBG: No results for input(s): "GLUCAP" in the last 168 hours.  Review of Systems:   na  Past Medical History:  He,  has a past medical history of Alcohol abuse, Amaurosis fugax, Aortic atherosclerosis ([place]), Atrial fibrillation ([place]), Carotid stenosis, CHF (congestive heart failure) ([place]), Coagulopathy ([place]), COPD (chronic obstructive pulmonary disease) ([place]), Coronary artery disease, Depression,  CABG (07/13/2015), Hyperlipemia, Hypertension, Leucocytosis, Liver dysfunction, Long term current use of clopidogrel, NSTEMI (non-ST elevated myocardial infarction) ([place]), Peripheral artery disease ([place]), Tobacco abuse, and Wears dentures.   Surgical History:   Past Surgical History:  Procedure Laterality Date   APPLICATION OF WOUND VAC Right 06/22/2020   Procedure: APPLICATION OF WOUND VAC;  Surgeon: Algernon Huxley, MD;  Location: [facility] ORS;  Service: Vascular;  Laterality: Right;   CARDIAC CATHETERIZATION N/A 07/10/2015   Procedure: Right/Left Heart Cath and Coronary Angiography;  Surgeon: Yolonda Kida, MD;  Location: [place] CV LAB;  Service: Cardiovascular;  Laterality: N/A;   CORONARY ARTERY BYPASS GRAFT  07/13/2015   [facility].  4 vessel   ENDARTERECTOMY Right 01/02/2021   Procedure: ENDARTERECTOMY CAROTID;  Surgeon: Algernon Huxley, MD;  Location: [facility] ORS;  Service: Vascular;  Laterality: Right;   ENDARTERECTOMY FEMORAL Bilateral 05/23/2020   Procedure: ENDARTERECTOMY FEMORAL BILATERAL SUPERFICIAL FEMORAL ARTERY STENTS ;  Surgeon: Katha Cabal, MD;  Location: [facility] ORS;  Service: Vascular;  Laterality: Bilateral;   FALSE ANEURYSM  REPAIR Left 07/22/2020   Procedure: REPAIR LEFT FEMORAL PSEUDOANUERYSM;  Surgeon: Evaristo Bury, MD;  Location: [facility] ORS;  Service: Vascular;  Laterality: Left;   INSERTION OF ILIAC STENT Left 05/23/2020   Procedure: INSERTION OF ILIAC STENT;  Surgeon: Katha Cabal, MD;  Location: [facility] ORS;  Service: Vascular;  Laterality: Left;   LEFT HEART CATH AND CORS/GRAFTS ANGIOGRAPHY N/A 01/23/2020   Procedure: LEFT HEART CATH AND CORS/GRAFTS ANGIOGRAPHY;  Surgeon: Yolonda Kida, MD;  Location: [place] CV LAB;  Service: Cardiovascular;  Laterality: N/A;   LEFT HEART CATH AND CORS/GRAFTS ANGIOGRAPHY N/A 02/18/2022   Procedure: LEFT HEART CATH AND CORS/GRAFTS ANGIOGRAPHY;  Surgeon: Andrez Grime, MD;  Location: [place] CV LAB;  Service: Cardiovascular;  Laterality: N/A;   LOWER EXTREMITY ANGIOGRAPHY Right 04/03/2020   Procedure: LOWER EXTREMITY ANGIOGRAPHY;  Surgeon: Katha Cabal, MD;  Location: [place] CV LAB;  Service: Cardiovascular;  Laterality: Right;   OLECRANON BURSECTOMY Left 05/06/2019   Procedure: OLECRANON BURSECTOMY AND DEBRIDEMENT;  Surgeon: Leim Fabry, MD;  Location: [facility] ORS;  Service: Orthopedics;  Laterality: Left;   WOUND DEBRIDEMENT Right 06/22/2020   Procedure: DEBRIDEMENT WOUND RIGHT GROIN;  Surgeon: Algernon Huxley, MD;  Location: [facility] ORS;  Service: Vascular;  Laterality: Right;     Social History:   reports that he has been smoking cigarettes. He has a 135.00 pack-year smoking history. He has never used smokeless tobacco. He reports that he does not currently use alcohol. He reports that he does not use drugs.   Family History:  His family history includes Depression in his sister.   Allergies Allergies  Allergen Reactions   Chlorhexidine      Home Medications  Prior to Admission medications   Medication Sig Start Date End Date Taking? Authorizing Provider  albuterol (PROVENTIL) (2.5 MG/3ML) 0.083% nebulizer solution Take 3 mLs (2.5 mg  total) by nebulization every 6 (six) hours as needed for wheezing or shortness of breath. 02/15/20   Jennye Boroughs, MD  albuterol (VENTOLIN HFA) 108 (90 Base) MCG/ACT inhaler Inhale 1-2 puffs into the lungs every 6 (six) hours as needed for wheezing or shortness of breath. 02/15/20   Jennye Boroughs, MD  atorvastatin (LIPITOR) 80 MG tablet Take 1 tablet (80 mg total) by mouth daily at 6 PM. 07/10/15   Sainani, Belia Heman, MD  clopidogrel (PLAVIX) 75 MG tablet Take 75 mg by mouth every evening.  04/18/16   [provider]  ELIQUIS 5 MG TABS tablet Take 5 mg by mouth 2 (two) times daily. 02/20/22   [provider]  Fluticasone-Umeclidin-Vilant (TRELEGY ELLIPTA) 100-62.5-25 MCG/INH AEPB Inhale 1 puff into the lungs every other day. 01/27/20   [provider]  furosemide (LASIX) 40 MG tablet Take 40 mg by mouth 2 (two) times daily. 12/26/19   [provider]  losartan (COZAAR) 25 MG tablet Take 25 mg by mouth daily. 06/23/22   [provider]  nicotine (NICODERM CQ - DOSED IN MG/24 HOURS) 21 mg/24hr patch Place 1 patch (21 mg total) onto the skin daily. 07/10/22   Patrecia Pour, MD  OXYGEN Inhale 2 L into the lungs daily as needed (activity).    [provider]  spironolactone (ALDACTONE) 25 MG tablet Take 12.5 mg by mouth daily. 02/20/22   [provider]  zolpidem (AMBIEN) 5 MG tablet Take 5 mg by mouth at bedtime as needed for sleep. 04/10/21   [provider]     Critical care time: 45 min    Richardson Landry Lakira Ogando ACNP Acute Care Nurse Practitioner [place] Please consult Amion 09/12/2022, 7:57 AM

## 2022-09-12 NOTE — Progress Notes (Signed)
Initial Nutrition Assessment  DOCUMENTATION CODES:   Not applicable  INTERVENTION:   Tube Feeds via OG tube: Start Vital 1.5 at 25 mL/hr and advance by 10 mL every 8 hours to goal rate of 45 mL/hr (1080 mL per day) 60 mL ProSource TF20 - Daily Provides 1700 kcal, 93 gm protein, and 825 mL free water daily.  Monitor magnesium, potassium, and phosphorus BID for at least 3 days, MD to replete as needed, as pt is at risk for refeeding syndrome given muscle depletions and recent weight loss.  NUTRITION DIAGNOSIS:   Inadequate oral intake related to inability to eat as evidenced by NPO status.  GOAL:   Patient will meet greater than or equal to 90% of their needs  MONITOR:   Vent status, Labs, TF tolerance, I & O's  REASON FOR ASSESSMENT:   New TF, Ventilator    ASSESSMENT:   71 y.o. male presented to the ED with shortness of breath and decreased level of consciousness, requiring intubation on arrival to the ED. PT recently admitted to [facility] win October. PMH includes HTN, HLD, NSTEMI, COPD, A. Fib, CHF, s/p CABG x4, recent cessation of tobacco and EtOH. Pt admitted with respiratory failure due to wide complex tachycardia and O2 dependent COPD.    Family at bedside, reports that pt appetite has been ok. Able to endorse some weight loss, unsure how much.  Per EMR, pt has had a 9% weight loss within 2 months, this is clinically significant for time frame. Although, unable to determine actual weight loss from fluid loss due to chronic illnesses.   Patient is currently intubated on ventilator support. Plan for cardiac cath at some point.  MV: 12.4 L/min Temp (24hrs), Avg:97.8 F (36.6 C), Min:97 F (36.1 C), Max:98.6 F (37 C)  Drips Fentanyl Heparin Propofol: 6.3 ml/hr (166 kcal/day)  Medications reviewed and include: Protonix Labs reviewed: Creatinine 1.30, Hgb A1c 5.9% (02/17/22)   NUTRITION - FOCUSED PHYSICAL EXAM:  Flowsheet Row Most Recent Value  Orbital Region Unable  to assess  Upper Arm Region No depletion  Thoracic and Lumbar Region No depletion  Buccal Region Unable to assess  Temple Region Mild depletion  Clavicle Bone Region Mild depletion  Clavicle and Acromion Bone Region Mild depletion  Scapular Bone Region Mild depletion  Dorsal Hand Unable to assess  Patellar Region Mild depletion  Anterior Thigh Region Mild depletion  Posterior Calf Region Mild depletion  Edema (RD Assessment) None  Hair Reviewed  Eyes Reviewed  Mouth Unable to assess  Skin Reviewed  Nails Unable to assess   Diet Order:   Diet Order             Diet NPO time specified  Diet effective now                  EDUCATION NEEDS:   Not appropriate for education at this time  Skin:  Skin Assessment: Reviewed RN Assessment  Last BM:  Unknown  Height:  Ht Readings from Last 1 Encounters:  09/12/22 5\' 5"  (1.651 m)   Weight:  Wt Readings from Last 1 Encounters:  09/12/22 67.1 kg   Ideal Body Weight:  61.8 kg  BMI:  Body mass index is 24.62 kg/m.  Estimated Nutritional Needs:  Kcal:  1600-1800 Protein:  80-95 grams Fluid:  >/= 1.6 L    09/14/22 RD, LDN Clinical Dietitian See [facility] for contact information.

## 2022-09-12 NOTE — Progress Notes (Signed)
Responded to page to support patient.  Pt is intubated and going to ICU. Chaplain provided emotional and spiritual support to patient.  Supported Haematologist. Chaplain available as needed.  Venida Jarvis, [place], [facility], Pager [phone]

## 2022-09-12 NOTE — ED Notes (Signed)
MD notified about unable to obtain blood cultures, unsuccessful stick x2

## 2022-09-12 NOTE — ED Notes (Signed)
Verbal report given to Victoria G RN at this time 

## 2022-09-12 NOTE — ED Notes (Signed)
Unsuccessful x3 with OG and NG, keeps coiling

## 2022-09-12 NOTE — Progress Notes (Signed)
  Echocardiogram 2D Echocardiogram has been performed.  Dennis Preston 09/12/2022, 2:44 PM

## 2022-09-12 NOTE — Consult Note (Addendum)
Cardiology Consultation   Patient ID: [name] MRN: [id number]; DOB: Mar 05, 1951  Admit date: 09/12/2022 Date of Consult: 09/12/2022  PCP:  Dennis Harrier, MD   [place] Providers Cardiologist:  None    Click here to update MD or APP on Care Team, Refresh:1}     Patient Profile:   D[facility] is a 71 y.o. male with a hx of CAD s/p CABGx4 in 2016, s/p stenting of Left subclavian artery [id number], chronic diastolic heart fialure, aortic atherosclerosis, peripheral vascular disease, paroxysmal atrial fibrillation, HTN, HLD, tobacco aubse, COPD, ILD, ETOH abuse who is being seen 09/12/2022 for the evaluation of abnormal EKG at the request of [facility].  History of Present Illness:   Dennis [facility] is a 71 year old male with above medical history. Patient primarily followed by [facility].   Patient most recently underwent left heart catheterization on 02/18/2022.  Cath showed severe native three-vessel CAD, patent SVG-RPDA/RPL, patent PVG-ramus intermedius, known occluded SVG-Lcx/OM, patent LIMA-LAD that filled primarily in retrograde fashion from native coronary artery. Also showed 95% ostial left subclavian stenosis. Patient was transferred to [facility] where he underwent stenting of the left subclavian artery.   Patient was recently admitted to the [facility] in 06/2022 for evaluation of chest pain, sob, wide-complex tachycardia. Patient had woken up on 10/5 complaining of shortness of breath and chest pain.  Called EMS who found the patient to have decreased lung sounds and hypoxia with O2 saturation of 72%.  EKG with EMS was concerning for STEMI.  Code STEMI was activated, patient was treated with 324 mg aspirin, sublingual nitroglycerin, DuoNeb treatment, 125 mg of Solu-Medrol.  While in route to the [facility], patient required ventilation support by bag-valve-mask.  EMS reported 2 runs of V. tach like rhythm.  Patient arrived to the ED and was evaluated by  D[facility] who felt that EKG abnormalities were primarily repolarization.  Patient was intubated for airway management.  Underwent CT scan that showed bilateral airspace disease consistent with multilobar pneumonia.  While undergoing CT scan, patient again developed what appeared to be ventricular tachycardia, but pulses remained strong.  Underwent defibrillation x 2 and was given amiodarone bolus followed by amiodarone infusion.  Cardiology was consulted for management of ventricular tachycardia. However, it was determined that patient was actually having SVT with widening of QRS complex. Treated with metoprolol, but patient developed bradycardia so it was stopped. Noted to have developed LBBB while receiving nebulizer treatments. Echocardiogram on 07/04/22 showed EF 55-60%, no regional wall motion abnormalities, grade I diastolic dysfunction, normal RV systolic function. Discharged with recommendations for outpatient monitor with primary cardiologist.   Patient woke up again on 12/15 complaining of chest pain and SOB. Called EMS who found patient in respiratory distress with decreased lung sounds, rales, and expiratory wheezing. O2 sats were in the 70s. Given Douneb treatment in the field. Started on CPAP, but patient did not tolerate it and BVM was initiated. EKG with EMS showed a wide-complex tachycardia, heart rate 125 bpm.  Suspicious for left bundle branch block.  There was also ST elevation noted in leads II, 3, aVF, V1-V3. Code STEMI activated.   Upon arrival to the ED, vital signs showed HR 110 BPM, respiration 24 BPM, BP 97/72. Patient was intubated for airway management. Follow-up EKG in the ER showed sinus tachycardia, heart rate 110 bpm, narrow QRS complex.  There were ST depression in leads I, II, III, aVF, V4-V6. STEMI was canceled. Given ST depression on EKG,  patient was started on IV heparin for management of NSTEMI. However, urgent cardiac catheterization was canceled as patient needed to be  stabilized from a respiratory standpoint prior to the procedure. [facility] performed bedside echo and estimated EF to be approx 35% with global hypokinesis.    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, SVG-OM1, SVG-PDA, SVG-PL   Hyperlipemia    Hypertension    Leucocytosis    Liver dysfunction    Long term current use of clopidogrel    NSTEMI (non-ST elevated myocardial infarction) ([place])    approx 2016   Peripheral artery disease (HCC)    legs   Tobacco abuse    Wears dentures    full upper and lower    Past Surgical History:  Procedure Laterality Date   APPLICATION OF WOUND VAC Right 06/22/2020   Procedure: APPLICATION OF WOUND VAC;  Surgeon: Algernon Huxley, MD;  Location: [facility];  Service: Vascular;  Laterality: Right;   CARDIAC CATHETERIZATION N/A 07/10/2015   Procedure: Right/Left Heart Cath and Coronary Angiography;  Surgeon: Yolonda Kida, MD;  Location: [facility];  Service: Cardiovascular;  Laterality: N/A;   CORONARY ARTERY BYPASS GRAFT  07/13/2015   [facility].  4 vessel   ENDARTERECTOMY Right 01/02/2021   Procedure: ENDARTERECTOMY CAROTID;  Surgeon: Algernon Huxley, MD;  Location: [facility];  Service: Vascular;  Laterality: Right;   ENDARTERECTOMY FEMORAL Bilateral 05/23/2020   Procedure: ENDARTERECTOMY FEMORAL BILATERAL SUPERFICIAL FEMORAL ARTERY STENTS ;  Surgeon: Katha Cabal, MD;  Location: [facility];  Service: Vascular;  Laterality: Bilateral;   FALSE ANEURYSM REPAIR Left 07/22/2020   Procedure: REPAIR LEFT FEMORAL PSEUDOANUERYSM;  Surgeon: Evaristo Bury, MD;  Location: [facility];  Service: Vascular;  Laterality: Left;   INSERTION OF ILIAC STENT Left  05/23/2020   Procedure: INSERTION OF ILIAC STENT;  Surgeon: Katha Cabal, MD;  Location: [facility];  Service: Vascular;  Laterality: Left;   LEFT HEART CATH AND CORS/GRAFTS ANGIOGRAPHY N/A 01/23/2020   Procedure: LEFT HEART CATH AND CORS/GRAFTS ANGIOGRAPHY;  Surgeon: Yolonda Kida, MD;  Location: [facility];  Service: Cardiovascular;  Laterality: N/A;   LEFT HEART CATH AND CORS/GRAFTS ANGIOGRAPHY N/A 02/18/2022   Procedure: LEFT HEART CATH AND CORS/GRAFTS ANGIOGRAPHY;  Surgeon: Andrez Grime, MD;  Location: [facility];  Service: Cardiovascular;  Laterality: N/A;   LOWER EXTREMITY ANGIOGRAPHY Right 04/03/2020   Procedure: LOWER EXTREMITY ANGIOGRAPHY;  Surgeon: Katha Cabal, MD;  Location: [facility];  Service: Cardiovascular;  Laterality: Right;   OLECRANON BURSECTOMY Left 05/06/2019   Procedure: OLECRANON BURSECTOMY AND DEBRIDEMENT;  Surgeon: Leim Fabry, MD;  Location: [facility];  Service: Orthopedics;  Laterality: Left;   WOUND DEBRIDEMENT Right 06/22/2020   Procedure: DEBRIDEMENT WOUND RIGHT GROIN;  Surgeon: Algernon Huxley, MD;  Location: [facility];  Service: Vascular;  Laterality: Right;     Home Medications:  Prior to Admission medications   Medication Sig Start Date End Date Taking? Authorizing Provider  albuterol (PROVENTIL) (2.5 MG/3ML) 0.083% nebulizer solution Take 3 mLs (2.5 mg total) by nebulization  every 6 (six) hours as needed for wheezing or shortness of breath. 02/15/20   Lurene Shadow, MD  albuterol (VENTOLIN HFA) 108 (90 Base) MCG/ACT inhaler Inhale 1-2 puffs into the lungs every 6 (six) hours as needed for wheezing or shortness of breath. 02/15/20   Lurene Shadow, MD  atorvastatin (LIPITOR) 80 MG tablet Take 1 tablet (80 mg total) by mouth daily at 6 PM. 07/10/15   Houston Siren, MD  clopidogrel (PLAVIX) 75 MG tablet Take 75 mg by mouth every evening.  04/18/16   [provider]  ELIQUIS 5 MG TABS tablet Take 5 mg by mouth 2  (two) times daily. 02/20/22   [provider]  Fluticasone-Umeclidin-Vilant (TRELEGY ELLIPTA) 100-62.5-25 MCG/INH AEPB Inhale 1 puff into the lungs every other day. 01/27/20   [provider]  furosemide (LASIX) 40 MG tablet Take 40 mg by mouth 2 (two) times daily. 12/26/19   [provider]  losartan (COZAAR) 25 MG tablet Take 25 mg by mouth daily. 06/23/22   [provider]  nicotine (NICODERM CQ - DOSED IN MG/24 HOURS) 21 mg/24hr patch Place 1 patch (21 mg total) onto the skin daily. 07/10/22   Tyrone Nine, MD  OXYGEN Inhale 2 L into the lungs daily as needed (activity).    [provider]  spironolactone (ALDACTONE) 25 MG tablet Take 12.5 mg by mouth daily. 02/20/22   [provider]  zolpidem (AMBIEN) 5 MG tablet Take 5 mg by mouth at bedtime as needed for sleep. 04/10/21   [provider]    Inpatient Medications: Scheduled Meds:  fentaNYL (SUBLIMAZE) injection  25 mcg Intravenous Once   rocuronium bromide       Continuous Infusions:  sodium chloride     ceFEPime (MAXIPIME) IV     fentaNYL infusion INTRAVENOUS 100 mcg/hr (09/12/22 0733)   heparin     propofol (DIPRIVAN) infusion 5 mcg/kg/min (09/12/22 0735)   vancomycin     PRN Meds: fentaNYL, rocuronium bromide  Allergies:    Allergies  Allergen Reactions   Chlorhexidine     Social History:   Social History   Socioeconomic History   Marital status: Married    Spouse name: Not on file   Number of children: Not on file   Years of education: Not on file   Highest education level: Not on file  Occupational History   Not on file  Tobacco Use   Smoking status: Some Days    Packs/day: 2.50    Years: 54.00    Total pack years: 135.00    Types: Cigarettes   Smokeless tobacco: Never   Tobacco comments:    0.5PPD 09/17/21  Vaping Use   Vaping Use: Never used  Substance and Sexual Activity   Alcohol use: Not Currently    Comment: quit over 15 yrs ago   Drug  use: Never   Sexual activity: Not on file  Other Topics Concern   Not on file  Social History Narrative   Not on file   Social Determinants of Health   Financial Resource Strain: Not on file  Food Insecurity: Not on file  Transportation Needs: Not on file  Physical Activity: Not on file  Stress: Not on file  Social Connections: Not on file  Intimate Partner Violence: Not on file    Family History:    Family History  Problem Relation Age of Onset   Depression Sister      ROS:  Please see the history of  present illness.   All other ROS reviewed and negative.     Physical Exam/Data:   Vitals:   09/12/22 0730 09/12/22 0735 09/12/22 0745 09/12/22 0750  BP: (!) 148/103 (!) 84/62 101/71 115/71  Pulse: (!) 110 (!) 102 (!) 102 (!) 103  Resp: (!) 21 (!) 21 (!) 22 18  SpO2: 100% 98% 97% 96%  Weight:      Height:       No intake or output data in the 24 hours ending 09/12/22 0809    09/12/2022    7:01 AM 07/10/2022    3:55 AM 07/09/2022    4:11 AM  Last 3 Weights  Weight (lbs) 164 lb 162 lb 6.4 oz 166 lb 14.2 oz  Weight (kg) 74.39 kg 73.664 kg 75.7 kg     Body mass index is 27.29 kg/m.   Physical Exam per MD-- see attestation   EKG:  The EKG was personally reviewed and demonstrates:  sinus tachycardia, heart rate 110 bpm, narrow QRS complex.  There were ST depression in leads I, II, III, aVF, V4-V6 Telemetry:  Telemetry was personally reviewed and demonstrates:  Sinus tachycardia   Relevant CV Studies:   Laboratory Data:  High Sensitivity Troponin:  No results for input(s): "TROPONINIHS" in the last 720 hours.   Chemistry Recent Labs  Lab 09/12/22 0712 09/12/22 0719  NA 139  139 134*  K 4.3  4.3 5.0  CL 98  --   GLUCOSE 261*  --   BUN 21  --   CREATININE 1.30*  --     No results for input(s): "PROT", "ALBUMIN", "AST", "ALT", "ALKPHOS", "BILITOT" in the last 168 hours. Lipids No results for input(s): "CHOL", "TRIG", "HDL", "LABVLDL", "LDLCALC",  "CHOLHDL" in the last 168 hours.  Hematology Recent Labs  Lab 09/12/22 0656 09/12/22 0712 09/12/22 0719  WBC 20.5*  --   --   RBC 5.19  --   --   HGB 16.3 17.3*  17.3* 17.0  HCT 49.2 51.0  51.0 50.0  MCV 94.8  --   --   MCH 31.4  --   --   MCHC 33.1  --   --   RDW 13.9  --   --   PLT 316  --   --    Thyroid No results for input(s): "TSH", "FREET4" in the last 168 hours.  BNPNo results for input(s): "BNP", "PROBNP" in the last 168 hours.  DDimer No results for input(s): "DDIMER" in the last 168 hours.   Radiology/Studies:  DG Chest Port 1 View  Result Date: 09/12/2022 CLINICAL DATA:  Intubation EXAM: PORTABLE CHEST 1 VIEW COMPARISON:  07/04/2022 FINDINGS: Endotracheal tube with tip below the clavicular heads and above the carina. The enteric tube loops through the mid esophagus and then again at the lower neck with tip at the thoracic inlet. Artifact from support hardware. Infiltrate asymmetric in the left lung, similar appearance was seen on prior. No visible effusion or pneumothorax of note. Trace pleural fluid is possible on the left. Normal heart size. CABG and left subclavian stenting. Tube positioning was directly communicated in epic. IMPRESSION: 1. Malpositioned enteric tube which loops through the esophagus and lower neck. 2. Unremarkable endotracheal tube. 3. Infiltrate in the left lung that was also noted in October and could be recurrent or persistent, need follow-up. Electronically Signed   By: Jorje Guild M.D.   On: 09/12/2022 07:38     Assessment and Plan:   NSTEMI CAD s/p CABG  -  Patient called EMS complaining of chest pain, shortness of breath. Found to be in respiratory distress. EKG initially concerning for STEMI, but felt to be sinus tachycardia with LBBB - Repeat EKG in the ED showed ST depression in leads I, II, III, aVF, V4-V6 - [facility] performed bedside echo and estimated EF to be 35%-40%.  - Ordered formal echocardiogram  - hsTn pending  - Patient  is on IV heparin  - Plan for cardiac catheterization when stabilized from a respiratory standpoint  - Continue home lipitor, plavix (not on ASA as he is on eliquis at home)   Wide Complex Tachycardia  - EKG in the field showed wide-complex tachycardia, heart rate 125 bpm.  Suspicious for left bundle branch block. Repeat EKG in the ED showed a narrow complex tachycardia.  - Note-- patient recently admitted in 06/2022 with a similar presentation. Had episodes of wide-complex tachycardia that admission that was felt to be SVT with LBBB. Patient also noted to have developed LBBB with nebulizer treatments. He was treated with BB, but patient became bradycardic  - Continue telemetry  - Consider trial of low dose BB  - Maintain K>4, mag>2   PAF  - Holding eliquis while patient on IV heparin   Risk Assessment/Risk Scores:   TIMI Risk Score for Unstable Angina or Non-ST Elevation MI:   The patient's TIMI risk score is 5, which indicates a 26% risk of all cause mortality, new or recurrent myocardial infarction or need for urgent revascularization in the next 14 days.{  CHA2DS2-VASc Score = 3  This indicates a 3.2% annual risk of stroke. The patient's score is based upon: CHF History: 0 HTN History: 1 Diabetes History: 0 Stroke History: 0 Vascular Disease History: 1 Age Score: 1 Gender Score: 0 For questions or updates, please contact [place] Please consult [email or web address] for contact info under  Signed, Margie Billet, PA-C  09/12/2022 8:09 AM  \Patient seen and examined, note reviewed with the signed Advanced Practice Provider. I personally reviewed laboratory data, imaging studies and relevant notes. I independently examined the patient and formulated the important aspects of the plan. I have personally discussed the plan with the patient and/or family. Comments or changes to the note/plan are indicated below.  Patient was seen by the bedside in the emergency department  in respiratory distress being intubated while we were at the bedside for protection of his airway. EKG initially with wide-complex tachycardia no clear evidence of ST elevations.  Repeated EKG with diffuse ST depressions.    Bedside echocardiogram done by me showing EF likely 35 to 40% with hypokinesis no appreciation for specific wall motion abnormalities, no pericardial effusion. At this time with his non-ST elevation MI it is in acute respiratory failure is best to treat the patient medically medically starting heparin drip, restarting his Plavix as well as Lipitor and monitor closely.  Once able given his history of CAD he will benefit from heart catheterization.  He will require full echocardiogram as based on my assessment the bedside he has slightly depressed EF to prior.  Please monitor his electrolytes maintain mag greater than 2, K greater than 4. For his paroxysmal atrial fibrillation he is on heparin drip that we will cover for his Eliquis. Will continue to follow with you.  Berniece Salines DO, MS [facility] Attending Cardiologist [place]  [address] [place], [place] [address] [phone] Website: [email or web address]

## 2022-09-12 NOTE — ED Triage Notes (Signed)
BIB [facility] from home called out for CP and SOB. Activated a STEMI on scene.On scene diminished lung sounds with expiratory wheezing, Douneb given, diaphoretic. Then rales heard started cpap not tolerating well had to remove to get him out of the house. Once in truck placed back on cpap still not tolerating it. 0109 versed 2.5 mg IM. CPAP continued until pt became tired and started BVM. PIV 18ga Lt FA

## 2022-09-12 NOTE — Progress Notes (Signed)
ANTICOAGULATION CONSULT NOTE   Pharmacy Consult for heparin Indication: chest pain/ACS  Allergies  Allergen Reactions   Chlorhexidine     Patient Measurements: Height: 5\' 5"  (165.1 cm) Weight: 67.1 kg (147 lb 14.9 oz) IBW/kg (Calculated) : 61.5 Heparin Dosing Weight: TBW  Vital Signs: Temp: 98.4 F (36.9 C) (12/15 1534) Temp Source: Bladder (12/15 1113) BP: 127/62 (12/15 1600) Pulse Rate: 73 (12/15 1630)  Labs: Recent Labs    09/12/22 0656 09/12/22 0712 09/12/22 0719 09/12/22 0916 09/12/22 1016 09/12/22 1315 09/12/22 1556  HGB 16.3 17.3*  17.3* 17.0 15.3  --   --   --   HCT 49.2 51.0  51.0 50.0 45.0  --   --   --   PLT 316  --   --   --   --   --   --   APTT 28  --   --   --   --   --  66*  LABPROT 15.7*  --   --   --   --   --   --   INR 1.3*  --   --   --   --   --   --   HEPARINUNFRC  --   --   --   --   --   --  >1.10*  CREATININE 1.48* 1.30*  --   --   --   --   --   TROPONINIHS 56*  --   --   --  533* 1,925*  --      Estimated Creatinine Clearance: 45.3 mL/min (A) (by C-G formula based on SCr of 1.3 mg/dL (H)).   Medical History: 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, SVG-OM1, SVG-PDA, SVG-PL   Hyperlipemia    Hypertension    Leucocytosis    Liver dysfunction    Long term current use of clopidogrel    NSTEMI (non-ST elevated myocardial infarction) (HCC)    approx 2016   Peripheral artery disease (HCC)    legs   Tobacco abuse    Wears dentures    full upper and lower    Assessment: 71 YOM presenting with CP and SOB, hx CAD.  Hx afib on Eliquis PTA.  Initial aPTT = 66, at lowest end of goal range.  No overt bleeding or  complications noted.  Goal of Therapy:  Heparin level 0.3-0.7 units/ml aPTT 66-102 seconds Monitor platelets by anticoagulation protocol: Yes   Plan:  Heparin gtt at 1000 units/hr, no bolus F/u 8 hour aPTT/HL F/u cards eval and recs   2017, Reece Leader, BCPS, BCCP Clinical Pharmacist  09/12/2022 5:10 PM   [facility] pharmacy phone numbers are listed on [email or web address]

## 2022-09-12 NOTE — Progress Notes (Signed)
New frequent PVCs and episodes of bradycardia in the 40s. Patient is alert, following commands, no chest pain. BMP ordered.

## 2022-09-12 NOTE — Progress Notes (Signed)
ABG results below, RR increased from 24-26 and FIO2 increased from 40%-50%. Pt transported to CT then 72M without complications.   Latest Reference Range & Units 09/12/22 09:16  Sample type  ARTERIAL  pH, Arterial 7.35 - 7.45  7.302 (L)  pCO2 arterial 32 - 48 mmHg 52.8 (H)  pO2, Arterial 83 - 108 mmHg 79 (L)  TCO2 22 - 32 mmol/L 28  Acid-base deficit 0.0 - 2.0 mmol/L 1.0  Bicarbonate 20.0 - 28.0 mmol/L 26.1  O2 Saturation % 94    09/12/22 0942  Adult Ventilator Settings  Vent Mode PRVC  Vt Set 490 mL (6cc)  Set Rate 26 bmp  FiO2 (%) 50 %  I Time 0.7 Sec(s)  PEEP 5 cmH20  Adult Ventilator Measurements  Peak Airway Pressure 39 L/min  Mean Airway Pressure 14 cmH20  Plateau Pressure 27 cmH20  Resp Rate Spontaneous 1 br/min  Resp Rate Total 27 br/min  Exhaled Vt 502 mL  Measured Ve 12.6 mL  I:E Ratio Measured 1:2.3  SpO2 100 %

## 2022-09-12 NOTE — Progress Notes (Signed)
ANTICOAGULATION CONSULT NOTE - Initial Consult  Pharmacy Consult for heparin Indication: chest pain/ACS  Allergies  Allergen Reactions   Chlorhexidine     Patient Measurements: Height: 5\' 5"  (165.1 cm) Weight: 74.4 kg (164 lb) IBW/kg (Calculated) : 61.5 Heparin Dosing Weight: TBW  Vital Signs: BP: 159/96 (12/15 0715) Pulse Rate: 113 (12/15 0715)  Labs: Recent Labs    09/12/22 0712  HGB 17.3*  17.3*  HCT 51.0  51.0  CREATININE 1.30*    Estimated Creatinine Clearance: 49.2 mL/min (A) (by C-G formula based on SCr of 1.3 mg/dL (H)).   Medical History: 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, SVG-OM1, SVG-PDA, SVG-PL   Hyperlipemia    Hypertension    Leucocytosis    Liver dysfunction    Long term current use of clopidogrel    NSTEMI (non-ST elevated myocardial infarction) (HCC)    approx 2016   Peripheral artery disease (HCC)    legs   Tobacco abuse    Wears dentures    full upper and lower    Assessment: 71 YOM presenting with CP and SOB, hx CAD.  Hx afib on Eliquis PTA.  Goal of Therapy:  Heparin level 0.3-0.7 units/ml aPTT 66-102 seconds Monitor platelets by anticoagulation protocol: Yes   Plan:  Heparin gtt at 900 units/hr, no bolus F/u 8 hour aPTT/HL F/u cards eval and recs  2017, PharmD, [facility] Clinical Pharmacist ED Pharmacist Phone # [phone] 09/12/2022 7:21 AM

## 2022-09-13 DIAGNOSIS — R000 Tachycardia, unspecified: Secondary | ICD-10-CM | POA: Diagnosis not present

## 2022-09-13 DIAGNOSIS — I472 Ventricular tachycardia, unspecified: Secondary | ICD-10-CM | POA: Diagnosis not present

## 2022-09-13 DIAGNOSIS — J9601 Acute respiratory failure with hypoxia: Secondary | ICD-10-CM | POA: Diagnosis not present

## 2022-09-13 DIAGNOSIS — I214 Non-ST elevation (NSTEMI) myocardial infarction: Secondary | ICD-10-CM

## 2022-09-13 DIAGNOSIS — J441 Chronic obstructive pulmonary disease with (acute) exacerbation: Secondary | ICD-10-CM | POA: Diagnosis not present

## 2022-09-13 LAB — BASIC METABOLIC PANEL
Anion gap: 9 (ref 5–15)
Anion gap: 10 (ref 5–15)
BUN: 28 mg/dL — ABNORMAL HIGH (ref 8–23)
BUN: 29 mg/dL — ABNORMAL HIGH (ref 8–23)
CO2: 24 mmol/L (ref 22–32)
CO2: 23 mmol/L (ref 22–32)
Calcium: 8.5 mg/dL — ABNORMAL LOW (ref 8.9–10.3)
Calcium: 8.3 mg/dL — ABNORMAL LOW (ref 8.9–10.3)
Chloride: 106 mmol/L (ref 98–111)
Chloride: 109 mmol/L (ref 98–111)
Creatinine, Ser: 1.18 mg/dL (ref 0.61–1.24)
Creatinine, Ser: 0.92 mg/dL (ref 0.61–1.24)
GFR, Estimated: >60 mL/min (ref >60)
GFR, Estimated: >60 mL/min (ref >60)
Glucose, Bld: 144 mg/dL — ABNORMAL HIGH (ref 70–99)
Glucose, Bld: 153 mg/dL — ABNORMAL HIGH (ref 70–99)
Potassium: 4.2 mmol/L (ref 3.5–5.1)
Potassium: 4.1 mmol/L (ref 3.5–5.1)
Sodium: 139 mmol/L (ref 135–145)
Sodium: 142 mmol/L (ref 135–145)

## 2022-09-13 LAB — PHOSPHORUS
Phosphorus: 2.3 mg/dL — ABNORMAL LOW (ref 2.5–4.6)
Phosphorus: 1.9 mg/dL — ABNORMAL LOW (ref 2.5–4.6)

## 2022-09-13 LAB — HEMOGLOBIN A1C
Hgb A1c MFr Bld: 6.2 % — ABNORMAL HIGH (ref 4.8–5.6)
Mean Plasma Glucose: 131 mg/dL

## 2022-09-13 LAB — CBC
HCT: 37.8 % — ABNORMAL LOW (ref 39.0–52.0)
Hemoglobin: 12.7 g/dL — ABNORMAL LOW (ref 13.0–17.0)
MCH: 31.1 pg (ref 26.0–34.0)
MCHC: 33.6 g/dL (ref 30.0–36.0)
MCV: 92.4 fL (ref 80.0–100.0)
Platelets: 187 10*3/uL (ref 150–400)
RBC: 4.09 MIL/uL — ABNORMAL LOW (ref 4.22–5.81)
RDW: 14.3 % (ref 11.5–15.5)
WBC: 12 10*3/uL — ABNORMAL HIGH (ref 4.0–10.5)
nRBC: 0 % (ref 0.0–0.2)

## 2022-09-13 LAB — TROPONIN I (HIGH SENSITIVITY)
Troponin I (High Sensitivity): 2510 ng/L (ref <18)
Troponin I (High Sensitivity): 1008 ng/L (ref <18)

## 2022-09-13 LAB — APTT
aPTT: 108 seconds — ABNORMAL HIGH (ref 24–36)
aPTT: 65 seconds — ABNORMAL HIGH (ref 24–36)
aPTT: 97 seconds — ABNORMAL HIGH (ref 24–36)

## 2022-09-13 LAB — GLUCOSE, CAPILLARY
Glucose-Capillary: 120 mg/dL — ABNORMAL HIGH (ref 70–99)
Glucose-Capillary: 162 mg/dL — ABNORMAL HIGH (ref 70–99)
Glucose-Capillary: 233 mg/dL — ABNORMAL HIGH (ref 70–99)
Glucose-Capillary: 170 mg/dL — ABNORMAL HIGH (ref 70–99)
Glucose-Capillary: 167 mg/dL — ABNORMAL HIGH (ref 70–99)

## 2022-09-13 LAB — MAGNESIUM
Magnesium: 2 mg/dL (ref 1.7–2.4)
Magnesium: 1 mg/dL — ABNORMAL LOW (ref 1.7–2.4)

## 2022-09-13 LAB — HEPARIN LEVEL (UNFRACTIONATED)
Heparin Unfractionated: >1.1 IU/mL — ABNORMAL HIGH (ref 0.30–0.70)
Heparin Unfractionated: >1.1 IU/mL — ABNORMAL HIGH (ref 0.30–0.70)

## 2022-09-13 MED ORDER — SODIUM PHOSPHATES 45 MMOLE/15ML IV SOLN
15.0000 mmol | Freq: Once | INTRAVENOUS | Status: AC
  Administered 2022-09-14: 15 mmol via INTRAVENOUS
  Filled 2022-09-13: qty 5

## 2022-09-13 MED ORDER — DEXMEDETOMIDINE HCL IN NACL 400 MCG/100ML IV SOLN
0.4000 ug/kg/h | INTRAVENOUS | Status: DC
  Administered 2022-09-13: 0.4 ug/kg/h via INTRAVENOUS
  Filled 2022-09-13: qty 100

## 2022-09-13 MED ORDER — MAGNESIUM SULFATE IN D5W 1-5 GM/100ML-% IV SOLN
1.0000 g | Freq: Once | INTRAVENOUS | Status: AC
  Administered 2022-09-13: 1 g via INTRAVENOUS
  Filled 2022-09-13: qty 100

## 2022-09-13 MED ORDER — AMIODARONE HCL IN DEXTROSE 360-4.14 MG/200ML-% IV SOLN
60.0000 mg/h | INTRAVENOUS | Status: DC
  Administered 2022-09-13: 60 mg/h via INTRAVENOUS
  Filled 2022-09-13 (×2): qty 200

## 2022-09-13 MED ORDER — ASPIRIN 81 MG PO TBEC: 81.0000 mg | DELAYED_RELEASE_TABLET | Freq: Every day | ORAL | Status: DC

## 2022-09-13 MED ORDER — AMIODARONE HCL IN DEXTROSE 360-4.14 MG/200ML-% IV SOLN
30.0000 mg/h | INTRAVENOUS | Status: DC
  Administered 2022-09-13 – 2022-09-16 (×7): 30 mg/h via INTRAVENOUS
  Filled 2022-09-13 (×5): qty 200

## 2022-09-13 MED ORDER — ALBUTEROL SULFATE (2.5 MG/3ML) 0.083% IN NEBU
2.5000 mg | INHALATION_SOLUTION | Freq: Three times a day (TID) | RESPIRATORY_TRACT | Status: DC
  Administered 2022-09-13 – 2022-09-14 (×2): 2.5 mg via RESPIRATORY_TRACT
  Filled 2022-09-13 (×3): qty 3

## 2022-09-13 MED ORDER — ASPIRIN 81 MG PO CHEW
81.0000 mg | CHEWABLE_TABLET | Freq: Every day | ORAL | Status: DC
  Administered 2022-09-13: 81 mg
  Filled 2022-09-13: qty 1

## 2022-09-13 MED ORDER — SODIUM PHOSPHATES 45 MMOLE/15ML IV SOLN
30.0000 mmol | Freq: Once | INTRAVENOUS | Status: AC
  Administered 2022-09-13: 30 mmol via INTRAVENOUS
  Filled 2022-09-13: qty 10

## 2022-09-13 NOTE — Progress Notes (Signed)
ANTICOAGULATION CONSULT NOTE   Pharmacy Consult for heparin Indication: chest pain/ACS  Allergies  Allergen Reactions   Chlorhexidine     Patient Measurements: Height: 5\' 5"  (165.1 cm) Weight: 71.2 kg (156 lb 15.5 oz) IBW/kg (Calculated) : 61.5 Heparin Dosing Weight: TBW  Vital Signs: Temp: 99.5 F (37.5 C) (12/16 1540) Temp Source: Bladder (12/16 1540) BP: 170/75 (12/16 1800) Pulse Rate: 77 (12/16 1845)  Labs: Recent Labs    09/12/22 0656 09/12/22 0712 09/12/22 0719 09/12/22 0916 09/12/22 1016 09/12/22 1556 09/12/22 1641 09/12/22 1849 09/13/22 0050 09/13/22 0110 09/13/22 1055 09/13/22 1156 09/13/22 2008  HGB 16.3 17.3*  17.3* 17.0 15.3  --   --   --   --  12.7*  --   --   --   --   HCT 49.2 51.0  51.0 50.0 45.0  --   --   --   --  37.8*  --   --   --   --   PLT 316  --   --   --   --   --   --   --  187  --   --   --   --   APTT 28  --   --   --   --  66*  --   --  108*  --   --  65* 97*  LABPROT 15.7*  --   --   --   --   --   --   --   --   --   --   --   --   INR 1.3*  --   --   --   --   --   --   --   --   --   --   --   --   HEPARINUNFRC  --   --   --   --   --  >1.10*  --   --  >1.10*  --   --   --  >1.10*  CREATININE 1.48* 1.30*  --   --   --   --   --   --  1.18  --   --   --   --   TROPONINIHS 56*  --   --   --    < >  --    < > 2,773*  --  2,510* 1,008*  --   --    < > = values in this interval not displayed.     Estimated Creatinine Clearance: 49.9 mL/min (by C-G formula based on SCr of 1.18 mg/dL).   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  Hx afib on Eliquis PTA. Evaluation and workup underway by cardiology and EP services.   S/p rate increase to 950 units/hr aPTT is therapeutic at 97 seconds, Anti-Xa level remains elevated as expected from Eliquis dose  Goal of Therapy:  Heparin level 0.3-0.7 units/ml aPTT 66-102 seconds Monitor platelets by anticoagulation protocol: Yes   Plan:  Continue heparin gtt at 950 units/hr Daily aPTT/HL, CBC, s/s bleeding  Bertis Ruddy, PharmD, [facility] [place] Clinical Pharmacist ED Pharmacist Phone # [phone] 09/13/2022 8:59 PM

## 2022-09-13 NOTE — Consult Note (Signed)
ELECTROPHYSIOLOGY CONSULT NOTE    Patient ID: ORPHEUS SCHMICK MRN: [id number], DOB/AGE: [birth year] 71 y.o.  Admit date: 09/12/2022 Date of Consult: 09/13/2022  Primary Physician: Tracie Harrier, MD   Patient Profile: Dennis Preston is a 71 y.o. male with a history of CAD s/p CABG x4, left subclavian stent, chronic diastolic heart fialure, aortic atherosclerosis, peripheral vascular disease, paroxysmal atrial fibrillation, HTN, HLD, tobacco aubse, COPD, ILD, ETOH abuse admitted with NSTEMI, WCT, and respiratory failure  who is being seen today for the evaluation of WCT at the request of Dr. Gardiner Rhyme.  HPI:  Dennis Preston is a 71 y.o. male with the above noted history and presentation.  He is currently intubated and unable to provide a history. I reviewed the H&P and prior notes.  He was admitted in October with chest pain, shortness of breath, and WCT. He was treated as STEMI. At that time, he was given amiodarone and had DCCV x2 for WCT. Eventually, he was thought to be having SVT with aberrancy.  He is admitted now after a similar presentation. He called EMS for CP and was found to be in respiratory distress and was intubated. ECG c/w NSTEMI with diffuse ST depression. EF ~35%, globally decreased.  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, SVG-OM1, SVG-PDA, SVG-PL   Hyperlipemia    Hypertension    Leucocytosis    Liver dysfunction    Long term current use of clopidogrel    NSTEMI (non-ST elevated myocardial infarction) ([place])    approx 2016   Peripheral artery disease (HCC)    legs   Tobacco abuse    Wears dentures    full upper and lower     Surgical  History:  Past Surgical History:  Procedure Laterality Date   APPLICATION OF WOUND VAC Right 06/22/2020   Procedure: APPLICATION OF WOUND VAC;  Surgeon: Algernon Huxley, MD;  Location: [facility];  Service: Vascular;  Laterality: Right;   CARDIAC CATHETERIZATION N/A 07/10/2015   Procedure: Right/Left Heart Cath and Coronary Angiography;  Surgeon: Yolonda Kida, MD;  Location: [place] [facility];  Service: Cardiovascular;  Laterality: N/A;   CORONARY ARTERY BYPASS GRAFT  07/13/2015   [facility].  4 vessel   ENDARTERECTOMY Right 01/02/2021   Procedure: ENDARTERECTOMY CAROTID;  Surgeon: Algernon Huxley, MD;  Location: [facility];  Service: Vascular;  Laterality: Right;   ENDARTERECTOMY FEMORAL Bilateral 05/23/2020   Procedure: ENDARTERECTOMY FEMORAL BILATERAL SUPERFICIAL FEMORAL ARTERY STENTS ;  Surgeon: Katha Cabal, MD;  Location: [facility];  Service: Vascular;  Laterality: Bilateral;   FALSE ANEURYSM REPAIR Left 07/22/2020   Procedure: REPAIR LEFT FEMORAL PSEUDOANUERYSM;  Surgeon: Evaristo Bury, MD;  Location: [facility];  Service: Vascular;  Laterality: Left;   INSERTION OF ILIAC STENT Left 05/23/2020   Procedure: INSERTION OF ILIAC STENT;  Surgeon: Katha Cabal, MD;  Location: [facility];  Service: Vascular;  Laterality: Left;   LEFT HEART CATH AND CORS/GRAFTS ANGIOGRAPHY N/A 01/23/2020   Procedure: LEFT HEART CATH AND CORS/GRAFTS ANGIOGRAPHY;  Surgeon: Yolonda Kida, MD;  Location: [place] [facility];  Service: Cardiovascular;  Laterality: N/A;   LEFT HEART CATH AND CORS/GRAFTS ANGIOGRAPHY N/A 02/18/2022   Procedure: LEFT HEART CATH AND CORS/GRAFTS ANGIOGRAPHY;  Surgeon: Andrez Grime, MD;  Location: [place] [facility];  Service: Cardiovascular;  Laterality: N/A;   LOWER EXTREMITY ANGIOGRAPHY Right 04/03/2020   Procedure: LOWER EXTREMITY ANGIOGRAPHY;  Surgeon: Katha Cabal, MD;  Location: [place] [facility];  Service: Cardiovascular;  Laterality: Right;   OLECRANON BURSECTOMY  Left 05/06/2019   Procedure: OLECRANON BURSECTOMY AND DEBRIDEMENT;  Surgeon: Leim Fabry, MD;  Location: [facility];  Service: Orthopedics;  Laterality: Left;   WOUND DEBRIDEMENT Right 06/22/2020   Procedure: DEBRIDEMENT WOUND RIGHT GROIN;  Surgeon: Algernon Huxley, MD;  Location: [facility];  Service: Vascular;  Laterality: Right;     Medications Prior to Admission  Medication Sig Dispense Refill Last Dose   albuterol (PROVENTIL) (2.5 MG/3ML) 0.083% nebulizer solution Take 3 mLs (2.5 mg total) by nebulization every 6 (six) hours as needed for wheezing or shortness of breath. 360 mL 0 09/11/2022   albuterol (VENTOLIN HFA) 108 (90 Base) MCG/ACT inhaler Inhale 1-2 puffs into the lungs every 6 (six) hours as needed for wheezing or shortness of breath. 8 g 0 09/11/2022   atorvastatin (LIPITOR) 80 MG tablet Take 1 tablet (80 mg total) by mouth daily at 6 PM.   09/11/2022   clopidogrel (PLAVIX) 75 MG tablet Take 75 mg by mouth every evening.    09/11/2022   ELIQUIS 5 MG TABS tablet Take 5 mg by mouth 2 (two) times daily.   09/11/2022 at 2030   Fluticasone-Umeclidin-Vilant (TRELEGY ELLIPTA) 100-62.5-25 MCG/INH AEPB Inhale 1 puff into the lungs every other day.   09/11/2022   furosemide (LASIX) 40 MG tablet Take 40 mg by mouth 2 (two) times daily.   09/11/2022   losartan (COZAAR) 25 MG tablet Take 25 mg by mouth daily.   09/11/2022   nicotine (NICODERM CQ - DOSED IN MG/24 HOURS) 21 mg/24hr patch Place 1 patch (21 mg total) onto the skin daily. 28 patch 0 09/11/2022   spironolactone (ALDACTONE) 25 MG tablet Take 12.5 mg by mouth daily.   09/11/2022   zolpidem (AMBIEN) 5 MG tablet Take 5 mg by mouth at bedtime as needed for sleep.   Past Week   OXYGEN Inhale 2 L into the lungs daily as needed (activity).      roflumilast (DALIRESP) 500 MCG TABS tablet Take 500 mcg by mouth daily. (Patient not taking: Reported on 09/12/2022)   Not Taking    Inpatient Medications:   albuterol  2.5 mg Nebulization TID   aspirin   81 mg Per Tube Daily   atorvastatin  80 mg Per Tube Daily   clopidogrel  75 mg Per Tube Daily   feeding supplement (PROSource TF20)  60 mL Per Tube Daily   fentaNYL (SUBLIMAZE) injection  25 mcg Intravenous Once   insulin aspart  0-15 Units Subcutaneous Q4H   methylPREDNISolone (SOLU-MEDROL) injection  40 mg Intravenous Q12H   nicotine  21 mg Transdermal Daily   mouth rinse  15 mL Mouth Rinse Q2H   pantoprazole (PROTONIX) IV  40 mg Intravenous Q24H    Allergies:  Allergies  Allergen Reactions   Chlorhexidine     Social History   Socioeconomic History   Marital status: Married    Spouse name: Not on file   Number of children: Not on file   Years of education: Not on file   Highest education level: Not on  file  Occupational History   Not on file  Tobacco Use   Smoking status: Some Days    Packs/day: 2.50    Years: 54.00    Total pack years: 135.00    Types: Cigarettes   Smokeless tobacco: Never   Tobacco comments:    0.5PPD 09/17/21  Vaping Use   Vaping Use: Never used  Substance and Sexual Activity   Alcohol use: Not Currently    Comment: quit over 15 yrs ago   Drug use: Never   Sexual activity: Not on file  Other Topics Concern   Not on file  Social History Narrative   Not on file   Social Determinants of Health   Financial Resource Strain: Not on file  Food Insecurity: Not on file  Transportation Needs: Not on file  Physical Activity: Not on file  Stress: Not on file  Social Connections: Not on file  Intimate Partner Violence: Not on file     Family History  Problem Relation Age of Onset   Depression Sister      Review of Systems: All other systems reviewed and are otherwise negative except as noted above.  Physical Exam: Vitals:   09/13/22 0800 09/13/22 0900 09/13/22 0940 09/13/22 1000  BP:  101/74  (!) 100/57  Pulse: (!) 110 80  63  Resp: 18 (!) 26  (!) 26  Temp:      TempSrc:      SpO2: 97% 93% 94% 95%  Weight:      Height:         GEN- pt sedated, appears comfortable in bed on ventilator Resp: mechanically ventilated. +rales. CV: regular, tachy rhythm MSK: + edema  Labs:   Lab Results  Component Value Date   WBC 12.0 (H) 09/13/2022   HGB 12.7 (L) 09/13/2022   HCT 37.8 (L) 09/13/2022   MCV 92.4 09/13/2022   PLT 187 09/13/2022    Recent Labs  Lab 09/12/22 0656 09/12/22 0712 09/13/22 0050  NA 140   < > 139  K 4.3   < > 4.2  CL 99   < > 106  CO2 31  --  24  BUN 17   < > 28*  CREATININE 1.48*   < > 1.18  CALCIUM 9.0  --  8.5*  PROT 6.7  --   --   BILITOT 0.4  --   --   ALKPHOS 65  --   --   ALT 20  --   --   AST 22  --   --   GLUCOSE 258*   < > 144*   < > = values in this interval not displayed.      Radiology/Studies: ECHOCARDIOGRAM COMPLETE  Result Date: 09/12/2022    ECHOCARDIOGRAM REPORT   Patient Name:   EULAS SCHWEITZER Date of Exam: 09/12/2022 Medical Rec #:  [id number]       Height:       65.0 in Accession #:    [id number]      Weight:       147.9 lb Date of Birth:  [birth year]      BSA:          1.740 m Patient Age:    71 years        BP:           98/53 mmHg Patient Gender: M               HR:  65 bpm. Exam Location:  Inpatient Procedure: 2D Echo, Cardiac Doppler, Color Doppler and Intracardiac            Opacification Agent Indications:    122-I22.9 Subsequent ST elevation (STEM) and non-ST elevation                 (NSTEMI) myocardial infarction  History:        Patient has prior history of Echocardiogram examinations, most                 recent 07/04/2022. CAD, Abnormal ECG and Prior CABG,                 Arrythmias:Atrial Fibrillation and VT, Signs/Symptoms:Shortness                 of Breath, Dyspnea and Chest Pain; Risk Factors:Hypertension.                 Pulmonary edema.  Sonographer:    Sheralyn Boatman RDCS Referring Phys: Jonita Albee  Sonographer Comments: Technically difficult study due to poor echo windows, suboptimal parasternal window, suboptimal subcostal window and  echo performed with patient supine and on artificial respirator. Image acquisition challenging due to patient body habitus and Image acquisition challenging due to COPD. IMPRESSIONS  1. Left ventricular ejection fraction, by estimation, is 60 to 65%. The left ventricle has normal function. The left ventricle has no regional wall motion abnormalities. Left ventricular diastolic parameters are consistent with Grade I diastolic dysfunction (impaired relaxation).  2. Right ventricular systolic function is mildly reduced. The right ventricular size is normal.  3. The mitral valve is grossly normal. No evidence of mitral valve regurgitation. No evidence of mitral stenosis.  4. The aortic valve was not well visualized. Aortic valve regurgitation is not visualized. No aortic stenosis is present. FINDINGS  Left Ventricle: Left ventricular ejection fraction, by estimation, is 60 to 65%. The left ventricle has normal function. The left ventricle has no regional wall motion abnormalities. The left ventricular internal cavity size was normal in size. There is  no left ventricular hypertrophy. Left ventricular diastolic parameters are consistent with Grade I diastolic dysfunction (impaired relaxation). Right Ventricle: The right ventricular size is normal. No increase in right ventricular wall thickness. Right ventricular systolic function is mildly reduced. Left Atrium: Left atrial size was normal in size. Right Atrium: Right atrial size was normal in size. Pericardium: Trivial pericardial effusion is present. Mitral Valve: The mitral valve is grossly normal. No evidence of mitral valve regurgitation. No evidence of mitral valve stenosis. Tricuspid Valve: The tricuspid valve is grossly normal. Tricuspid valve regurgitation is not demonstrated. Aortic Valve: The aortic valve was not well visualized. Aortic valve regurgitation is not visualized. No aortic stenosis is present. Pulmonic Valve: The pulmonic valve was not well  visualized. Pulmonic valve regurgitation is not visualized. Aorta: The aortic root was not well visualized. Venous: IVC assessment for right atrial pressure unable to be performed due to mechanical ventilation. IAS/Shunts: The interatrial septum was not well visualized.  LEFT VENTRICLE PLAX 2D LVOT diam:     2.40 cm     Diastology LV SV:         87          LV e' medial:    2.94 cm/s LV SV Index:   50          LV E/e' medial:  16.3 LVOT Area:     4.52 cm    LV e' lateral:  7.29 cm/s                            LV E/e' lateral: 6.6  LV Volumes (MOD) LV vol d, MOD A2C: 81.6 ml LV vol d, MOD A4C: 58.4 ml LV vol s, MOD A2C: 25.0 ml LV vol s, MOD A4C: 21.2 ml LV SV MOD A2C:     56.6 ml LV SV MOD A4C:     58.4 ml LV SV MOD BP:      46.3 ml RIGHT VENTRICLE            IVC RV S prime:     4.43 cm/s  IVC diam: 2.10 cm TAPSE (M-mode): 0.7 cm LEFT ATRIUM             Index        RIGHT ATRIUM           Index LA Vol (A2C):   38.8 ml 22.30 ml/m  RA Area:     10.40 cm LA Vol (A4C):   18.3 ml 10.52 ml/m  RA Volume:   19.90 ml  11.44 ml/m LA Biplane Vol: 29.4 ml 16.89 ml/m  AORTIC VALVE LVOT Vmax:   100.00 cm/s LVOT Vmean:  59.800 cm/s LVOT VTI:    0.193 m MITRAL VALVE MV Area (PHT): 2.49 cm    SHUNTS MV Decel Time: 305 msec    Systemic VTI:  0.19 m MV E velocity: 48.00 cm/s  Systemic Diam: 2.40 cm MV A velocity: 73.25 cm/s MV E/A ratio:  0.66 Oswaldo Milian MD Electronically signed by Oswaldo Milian MD Signature Date/Time: 09/12/2022/3:03:50 PM    Final    DG Abd 1 View  Result Date: 09/12/2022 CLINICAL DATA:  Nasogastric tube placement EXAM: ABDOMEN - 1 VIEW COMPARISON:  Chest radiograph of earlier today FINDINGS: Nasogastric tube has been replaced/repositioned. Terminates at the gastric fundus with side port below the gastroesophageal junction. No gross free intraperitoneal air. Airspace disease throughout the left lung again identified. Median sternotomy for CABG. Incompletely imaged endotracheal tube.  IMPRESSION: Nasogastric tube terminating in the gastric fundus with side port well below the gastroesophageal junction. Electronically Signed   By: Abigail Miyamoto M.D.   On: 09/12/2022 10:26   CT HEAD WO CONTRAST (5MM)  Result Date: 09/12/2022 CLINICAL DATA:  Provided history: Altered mental status, nontraumatic. EXAM: CT HEAD WITHOUT CONTRAST TECHNIQUE: Contiguous axial images were obtained from the base of the skull through the vertex without intravenous contrast. RADIATION DOSE REDUCTION: This exam was performed according to the departmental dose-optimization program which includes automated exposure control, adjustment of the mA and/or kV according to patient size and/or use of iterative reconstruction technique. COMPARISON:  Prior head CT examinations 07/03/2022 and earlier. FINDINGS: Brain: Mild generalized cerebral atrophy. Mild ill-defined hypoattenuation within the cerebral white matter, nonspecific but compatible with chronic small vessel disease. 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 There is no acute intracranial hemorrhage. No demarcated cortical infarct. No extra-axial fluid collection. No evidence of an intracranial mass. No midline shift. Vascular: No hyperdense vessel. Skull: No fracture or aggressive osseous lesion. Sinuses/Orbits: No mass or acute finding within the imaged orbits. Mild mucosal thickening within the right frontal sinus. Mild-to-moderate mucosal thickening within the left frontal sinus. Mucosal thickening scattered within the bilateral ethmoid air cells, overall mild-to-moderate in severity. Mild mucosal thickening within the right sphenoid sinus. Mild mucosal thickening within the right maxillary  sinus. Other: Partially imaged ET tube. Congenital nonunion of the posterior arch of C1. IMPRESSION: 1. No evidence of acute intracranial abnormality. 2. Mild chronic small-vessel image changes within the cerebral  white matter. 3. 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 4. Mild generalized cerebral atrophy. 5. Paranasal sinus disease, as described. Electronically Signed   By: Kellie Simmering D.O.   On: 09/12/2022 09:38   DG Chest Port 1 View  Result Date: 09/12/2022 CLINICAL DATA:  Intubation EXAM: PORTABLE CHEST 1 VIEW COMPARISON:  07/04/2022 FINDINGS: Endotracheal tube with tip below the clavicular heads and above the carina. The enteric tube loops through the mid esophagus and then again at the lower neck with tip at the thoracic inlet. Artifact from support hardware. Infiltrate asymmetric in the left lung, similar appearance was seen on prior. No visible effusion or pneumothorax of note. Trace pleural fluid is possible on the left. Normal heart size. CABG and left subclavian stenting. Tube positioning was directly communicated in epic. IMPRESSION: 1. Malpositioned enteric tube which loops through the esophagus and lower neck. 2. Unremarkable endotracheal tube. 3. Infiltrate in the left lung that was also noted in October and could be recurrent or persistent, need follow-up. Electronically Signed   By: Jorje Guild M.D.   On: 09/12/2022 07:38    EKGs:(personally reviewed) 12/16 0853 Wide complex tachycardia, CL ~526msec. Negative precordial concordance. Rapid initial deflection.   12/15 Sinus rhythm with ST depression. PVCs with RBBB morphology and superiorly-directed axis occurring in bigeminy.  12/15 0646 likely sinus tachycardia with LBBB  07/07/2022 Sinus rhythm IRBBB  TELEMETRY: (personally reviewed) Sinus beats precede narrow QRS with IRBBB morphology  There are frequent sustained runs of WCT. At times, I am able to what may be flutter waves. The QRS morphology of sustained WCT is identical to that of aberrantly conducted PACs (eg 9:30:14 - sinus rhythm with PACs preceding wide complex beats)   Assessment/Plan: 1.  Wide-complex tachycardia: I suspect this is aberrant  conduction of supraventricular rhythms which can be any (or all) of the following -- sinus tachycardia, atrial ectopy, atrial flutter. I discussed with Dr. Gardiner Rhyme. We will start amiodarone without bolus so as not to overly exacerbate QT prolongation.  2.  NSTEMI: diffuse ST elevation, Avr elevation, prolonged QT  3. Prolonged QT -  in setting of ischemia. Avoid QT prolonging drugs  4. History of PAF: possible flutter on telemetry though difficult to tell with broad QRS-T. Starting amiodarone. On heparin gtt  5. Acute hypoxic respiratory failure - history of COPD    For questions or updates, please contact [facility] Please consult [email or web address] for contact info under Cardiology/STEMI.  Signed, Doralee Albino, MD 09/13/2022 10:56 AM

## 2022-09-13 NOTE — H&P (Signed)
NAME:  Dennis Preston, MRN:  [id number], DOB:  12-18-1950, LOS: 1 ADMISSION DATE:  09/12/2022, CONSULTATION DATE: 09/12/2022 REFERRING MD: Emergency department physician, CHIEF COMPLAINT: Hypoxic respiratory failure, coronary artery disease, wide-complex tachycardia  History of Present Illness:  Dennis Preston 71 year old male with extensive past medical history is well-documented below.  Of note he stopped smoking and drinking for the last several months.  He was admitted to medicine hospital in October for similar episode.  He has been at [facility] for subclavian steal with stent placement.  EMS was notified due to shortness of breath decreased level of consciousness while in the ambulance he required bagging to maintain adequate ventilation on arrival to [facility] emergency department he was intubated.  He did have a wide-complex tachycardia that is now resolved.  He had bedside evaluation by cardiology he may or may not go to Cath Lab.  We will admit to the intensive care unit at this time.  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, SVG-OM1, SVG-PDA, SVG-PL   Hyperlipemia    Hypertension    Leucocytosis    Liver dysfunction    Long term current use of clopidogrel    NSTEMI (non-ST elevated myocardial infarction) ([place])    approx 2016   Peripheral artery disease (HCC)    legs   Tobacco abuse    Wears dentures    full upper and lower     Significant Hospital Events: Including procedures, antibiotic start and stop dates in addition to other pertinent events   09/12/2022 intubated Emergency Department  Interim  History / Subjective:  Patient continued to have wide-complex tachycardia, denies chest pain Remained afebrile Awake, tolerating supportive breathing trial  Objective   Blood pressure 126/69, pulse (!) 110, temperature 99.5 F (37.5 C), temperature source Bladder, resp. rate 18, height 5\' 5"  (1.651 m), weight 71.2 kg, SpO2 97 %.    Vent Mode: PRVC FiO2 (%):  [50 %] 50 % Set Rate:  [26 bmp] 26 bmp Vt Set:  [490 mL] 490 mL PEEP:  [5 cmH20] 5 cmH20 Plateau Pressure:  [21 cmH20-27 cmH20] 25 cmH20   Intake/Output Summary (Last 24 hours) at 09/13/2022 0830 Last data filed at 09/13/2022 0800 Gross per 24 hour  Intake 4259.88 ml  Output 835 ml  Net 3424.88 ml   Filed Weights   09/12/22 0701 09/12/22 0941 09/13/22 0500  Weight: 74.4 kg 67.1 kg 71.2 kg    Examination:   Physical exam: General: Crtitically ill-appearing male, orally intubated HEENT: [place]/AT, eyes anicteric.  ETT and OGT in place Neuro: Awake, following commands, moving all 4 extremities Chest: Bilateral faint expiratory wheezes, no rhonchi or crackles Heart: Tachycardia cardiac, wide-complex, no murmurs or gallops Abdomen: Soft, nondistended, bowel sounds present Skin: No rash  Resolved Hospital Problem list     Assessment & Plan:  Acute on chronic hypoxic/hypercapnic respiratory failure Acute COPD exacerbation Acute respiratory acidosis Continue lung protective ventilation VAP prevention bundle in place Hypercapnia has improved PAD protocol with propofol and fentanyl, RASS: 0 Tolerating spontaneous breathing trial, will wait  for cardiology plan regarding cardiac cath otherwise patient can be extubated Continue Solu-Medrol Continue nebs Continue ceftriaxone and azithromycin Adjust/send what works for him  Acute metabolic encephalopathy in the setting of hypercapnia Patient mental status has improved, he is awake after clearance of hypercapnia Minimize sedation  Acute HFpEF and right ventricular systolic  heart failure Wide-complex tachycardia Acute NSTEMI Paroxysmal A-fib Hypertension Hyperlipidemia Coronary artery disease s/p CABG Echocardiogram was done which showed EF of 65% with grade 1 diastolic dysfunction Right ventricular function is slightly depressed Patient presented with wide-complex tachycardia this morning EKG was repeated consistent with left bundle branch block His troponins were elevated Cardiology was called this morning, they will come evaluate him for possible cardiac cath Continue Plavix and statin Continue IV heparin infusion Holding antihypertensive for now  Acute kidney injury Hypophosphatemia Serum creatinine is trending down Continue aggressive electrolyte supplement  Best Practice (right click and "Reselect all SmartList Selections" daily)   Diet/type: NPO DVT prophylaxis: systemic heparin GI prophylaxis: PPI Lines: N/A Foley:  N/A Code Status:  full code Last date of multidisciplinary goals of care discussion  09/12/2022 0800 hrs. wife and son updated at bedside.  Code discussion started at this time they want full code.  They will discuss should the patient survive with or not to redo this in the future. Labs   CBC: Recent Labs  Lab 09/12/22 0656 09/12/22 0712 09/12/22 0719 09/12/22 0916 09/13/22 0050  WBC 20.5*  --   --   --  12.0*  NEUTROABS 13.2*  --   --   --   --   HGB 16.3 17.3*  17.3* 17.0 15.3 12.7*  HCT 49.2 51.0  51.0 50.0 45.0 37.8*  MCV 94.8  --   --   --  92.4  PLT 316  --   --   --  [id number]    Basic Metabolic Panel: Recent Labs  Lab 09/12/22 0656 09/12/22 0712 09/12/22 0719 09/12/22 0916 09/12/22 1556 09/13/22 0050  NA 140 139  139 134* 138  --  139  K 4.3 4.3  4.3 5.0 4.8  --  4.2  CL 99 98  --   --   --  106  CO2 31  --   --   --   --  24  GLUCOSE 258* 261*  --   --   --  144*  BUN 17 21  --   --   --  28*  CREATININE 1.48* 1.30*  --   --   --  1.18  CALCIUM 9.0  --   --   --   --  8.5*  MG  --   --   --    --  2.1 2.0  PHOS  --   --   --   --  2.0* 2.3*   GFR: Estimated Creatinine Clearance: 49.9 mL/min (by C-G formula based on SCr of 1.18 mg/dL). Recent Labs  Lab 09/12/22 0656 09/12/22 1016 09/12/22 1353 09/13/22 0050  WBC 20.5*  --   --  12.0*  LATICACIDVEN  --  2.0* 1.2  --     Liver Function Tests: Recent Labs  Lab 09/12/22 0656  AST 22  ALT 20  ALKPHOS 65  BILITOT 0.4  PROT 6.7  ALBUMIN 4.2   No results for input(s): "LIPASE", "AMYLASE" in the last 168 hours. No results for input(s): "AMMONIA" in the last 168 hours.  ABG    Component Value Date/Time   PHART 7.302 (L) 09/12/2022  0916   PCO2ART 52.8 (H) 09/12/2022 0916   PO2ART 79 (L) 09/12/2022 0916   HCO3 26.1 09/12/2022 0916   TCO2 28 09/12/2022 0916   ACIDBASEDEF 1.0 09/12/2022 0916   O2SAT 94 09/12/2022 0916     Coagulation Profile: Recent Labs  Lab 09/12/22 0656  INR 1.3*    Cardiac Enzymes: No results for input(s): "CKTOTAL", "CKMB", "CKMBINDEX", "TROPONINI" in the last 168 hours.  HbA1C: Hgb A1c MFr Bld  Date/Time Value Ref Range Status  09/12/2022 06:56 AM 6.2 (H) 4.8 - 5.6 % Final    Comment:    (NOTE)         Prediabetes: 5.7 - 6.4         Diabetes: >6.4         Glycemic control for adults with diabetes: <7.0   02/17/2022 08:57 AM 5.9 (H) 4.8 - 5.6 % Final    Comment:    (NOTE) Pre diabetes:          5.7%-6.4%  Diabetes:              >6.4%  Glycemic control for   <7.0% adults with diabetes     CBG: Recent Labs  Lab 09/12/22 0942 09/12/22 1533 09/12/22 1942 09/12/22 2336 09/13/22 0324  GLUCAP 161* 146* 138* 139* 120*   This patient is critically ill with multiple organ system failure which requires frequent high complexity decision making, assessment, support, evaluation, and titration of therapies. This was completed through the application of advanced monitoring technologies and extensive interpretation of multiple databases.  During this encounter critical care time was  devoted to patient care services described in this note for 37 minutes.    Cheri Fowler, MD [facility] See Amion for pager If no response to pager, please call [phone] until 7pm After 7pm, Please call E-link [phone]

## 2022-09-13 NOTE — Progress Notes (Signed)
ANTICOAGULATION CONSULT NOTE Pharmacy Consult for heparin Indication: chest pain/ACS Brief A/P: aPTT supratherapeutic Decrease Heparin rate  Allergies  Allergen Reactions   Chlorhexidine     Patient Measurements: Height: 5\' 5"  (165.1 cm) Weight: 67.1 kg (147 lb 14.9 oz) IBW/kg (Calculated) : 61.5 Heparin Dosing Weight: TBW  Vital Signs: Temp: 99.5 F (37.5 C) (12/15 2334) Temp Source: Bladder (12/15 2334) BP: 110/58 (12/16 0000) Pulse Rate: 64 (12/16 0000)  Labs: Recent Labs    09/12/22 0656 09/12/22 0712 09/12/22 0719 09/12/22 0916 09/12/22 1016 09/12/22 1315 09/12/22 1556 09/12/22 1641 09/12/22 1849 09/13/22 0050  HGB 16.3 17.3*  17.3* 17.0 15.3  --   --   --   --   --  12.7*  HCT 49.2 51.0  51.0 50.0 45.0  --   --   --   --   --  37.8*  PLT 316  --   --   --   --   --   --   --   --  187  APTT 28  --   --   --   --   --  66*  --   --  108*  LABPROT 15.7*  --   --   --   --   --   --   --   --   --   INR 1.3*  --   --   --   --   --   --   --   --   --   HEPARINUNFRC  --   --   --   --   --   --  >1.10*  --   --  >1.10*  CREATININE 1.48* 1.30*  --   --   --   --   --   --   --  1.18  TROPONINIHS 56*  --   --   --    < > 1,925*  --  2,604* 2,773*  --    < > = values in this interval not displayed.     Estimated Creatinine Clearance: 49.9 mL/min (by C-G formula based on SCr of 1.18 mg/dL).  Assessment: .71 y.o. male admitted with chest pain, h/o Afib and Eliquis on hold, for heparin   Goal of Therapy:  Heparin level 0.3-0.7 units/ml aPTT 66-102 seconds Monitor platelets by anticoagulation protocol: Yes   Plan:  Decrease Heparin 900 units/hr  62, PharmD, BCPS  09/13/2022 2:08 AM

## 2022-09-13 NOTE — Progress Notes (Addendum)
Rounding Note    Patient Name: Dennis Preston Date of Encounter: 09/13/2022  [facility] Cardiologist: None   Subjective   Awake, tolerating SBT. Cr 1.18, Hgb 12.7.  Shakes head no when asked about chest pain  Inpatient Medications    Scheduled Meds:  albuterol  2.5 mg Nebulization TID   atorvastatin  80 mg Per Tube Daily   clopidogrel  75 mg Per Tube Daily   feeding supplement (PROSource TF20)  60 mL Per Tube Daily   fentaNYL (SUBLIMAZE) injection  25 mcg Intravenous Once   insulin aspart  0-15 Units Subcutaneous Q4H   methylPREDNISolone (SOLU-MEDROL) injection  40 mg Intravenous Q12H   nicotine  21 mg Transdermal Daily   mouth rinse  15 mL Mouth Rinse Q2H   pantoprazole (PROTONIX) IV  40 mg Intravenous Q24H   Continuous Infusions:  sodium chloride 10 mL/hr at 09/13/22 0800   azithromycin Stopped (09/12/22 1613)   cefTRIAXone (ROCEPHIN)  IV Stopped (09/12/22 2202)   feeding supplement (VITAL 1.5 CAL) 35 mL/hr at 09/13/22 0800   fentaNYL infusion INTRAVENOUS 200 mcg/hr (09/13/22 0800)   heparin 900 Units/hr (09/13/22 0800)   propofol (DIPRIVAN) infusion 20 mcg/kg/min (09/13/22 0800)   sodium phosphate 30 mmol in dextrose 5 % 250 mL infusion     PRN Meds: albuterol, docusate, fentaNYL, mouth rinse, polyethylene glycol   Vital Signs    Vitals:   09/13/22 0600 09/13/22 0700 09/13/22 0752 09/13/22 0800  BP: (!) 144/97 126/69    Pulse: 77 63  (!) 110  Resp: (!) 26 (!) 26  18  Temp:   99.5 F (37.5 C)   TempSrc:   Bladder   SpO2: 97% 97%  97%  Weight:      Height:        Intake/Output Summary (Last 24 hours) at 09/13/2022 0916 Last data filed at 09/13/2022 0800 Gross per 24 hour  Intake 4259.88 ml  Output 960 ml  Net 3299.88 ml      09/13/2022    5:00 AM 09/12/2022    9:41 AM 09/12/2022    7:01 AM  Last 3 Weights  Weight (lbs) 156 lb 15.5 oz 147 lb 14.9 oz 164 lb  Weight (kg) 71.2 kg 67.1 kg 74.39 kg      Telemetry    Sinus rhythm  with PVCs, intermittent WCT up to 120s - Personally Reviewed  ECG    WCT rate 110. Precordial concordance, concerned for VT - Personally Reviewed  Physical Exam   GEN: Intubated Neck: No JVD Cardiac: RRR, no murmurs Respiratory: Mechanical breath sounds GI: Soft, nontender MS: No edema Neuro:  Nonfocal  Psych: Normal affect   Labs    High Sensitivity Troponin:   Recent Labs  Lab 09/12/22 1016 09/12/22 1315 09/12/22 1641 09/12/22 1849 09/13/22 0110  TROPONINIHS 533* 1,925* 2,604* 2,773* 2,510*     Chemistry Recent Labs  Lab 09/12/22 0656 09/12/22 0712 09/12/22 0719 09/12/22 0916 09/12/22 1556 09/13/22 0050  NA 140 139  139 134* 138  --  139  K 4.3 4.3  4.3 5.0 4.8  --  4.2  CL 99 98  --   --   --  106  CO2 31  --   --   --   --  24  GLUCOSE 258* 261*  --   --   --  144*  BUN 17 21  --   --   --  28*  CREATININE 1.48* 1.30*  --   --   --  1.18  CALCIUM 9.0  --   --   --   --  8.5*  MG  --   --   --   --  2.1 2.0  PROT 6.7  --   --   --   --   --   ALBUMIN 4.2  --   --   --   --   --   AST 22  --   --   --   --   --   ALT 20  --   --   --   --   --   ALKPHOS 65  --   --   --   --   --   BILITOT 0.4  --   --   --   --   --   GFRNONAA 50*  --   --   --   --  >60  ANIONGAP 10  --   --   --   --  9    Lipids  Recent Labs  Lab 09/12/22 0656  CHOL 120  TRIG 96  HDL 56  LDLCALC 45  CHOLHDL 2.1    Hematology Recent Labs  Lab 09/12/22 0656 09/12/22 0712 09/12/22 0719 09/12/22 0916 09/13/22 0050  WBC 20.5*  --   --   --  12.0*  RBC 5.19  --   --   --  4.09*  HGB 16.3   < > 17.0 15.3 12.7*  HCT 49.2   < > 50.0 45.0 37.8*  MCV 94.8  --   --   --  92.4  MCH 31.4  --   --   --  31.1  MCHC 33.1  --   --   --  33.6  RDW 13.9  --   --   --  14.3  PLT 316  --   --   --  187   < > = values in this interval not displayed.   Thyroid No results for input(s): "TSH", "FREET4" in the last 168 hours.  BNP Recent Labs  Lab 09/12/22 1016  BNP 63.0     DDimer No results for input(s): "DDIMER" in the last 168 hours.   Radiology    ECHOCARDIOGRAM COMPLETE  Result Date: 09/12/2022    ECHOCARDIOGRAM REPORT   Patient Name:   Dennis Preston Date of Exam: 09/12/2022 Medical Rec #:  [id number]       Height:       65.0 in Accession #:    [id number]      Weight:       147.9 lb Date of Birth:  03/23/1951      BSA:          1.740 m Patient Age:    71 years        BP:           98/53 mmHg Patient Gender: M               HR:           65 bpm. Exam Location:  Inpatient Procedure: 2D Echo, Cardiac Doppler, Color Doppler and Intracardiac            Opacification Agent Indications:    122-I22.9 Subsequent ST elevation (STEM) and non-ST elevation                 (NSTEMI) myocardial infarction  History:        Patient has prior history of Echocardiogram  examinations, most                 recent 07/04/2022. CAD, Abnormal ECG and Prior CABG,                 Arrythmias:Atrial Fibrillation and VT, Signs/Symptoms:Shortness                 of Breath, Dyspnea and Chest Pain; Risk Factors:Hypertension.                 Pulmonary edema.  Sonographer:    Roseanna Rainbow RDCS Referring Phys: Margie Billet  Sonographer Comments: Technically difficult study due to poor echo windows, suboptimal parasternal window, suboptimal subcostal window and echo performed with patient supine and on artificial respirator. Image acquisition challenging due to patient body habitus and Image acquisition challenging due to COPD. IMPRESSIONS  1. Left ventricular ejection fraction, by estimation, is 60 to 65%. The left ventricle has normal function. The left ventricle has no regional wall motion abnormalities. Left ventricular diastolic parameters are consistent with Grade I diastolic dysfunction (impaired relaxation).  2. Right ventricular systolic function is mildly reduced. The right ventricular size is normal.  3. The mitral valve is grossly normal. No evidence of mitral valve regurgitation. No evidence  of mitral stenosis.  4. The aortic valve was not well visualized. Aortic valve regurgitation is not visualized. No aortic stenosis is present. FINDINGS  Left Ventricle: Left ventricular ejection fraction, by estimation, is 60 to 65%. The left ventricle has normal function. The left ventricle has no regional wall motion abnormalities. The left ventricular internal cavity size was normal in size. There is  no left ventricular hypertrophy. Left ventricular diastolic parameters are consistent with Grade I diastolic dysfunction (impaired relaxation). Right Ventricle: The right ventricular size is normal. No increase in right ventricular wall thickness. Right ventricular systolic function is mildly reduced. Left Atrium: Left atrial size was normal in size. Right Atrium: Right atrial size was normal in size. Pericardium: Trivial pericardial effusion is present. Mitral Valve: The mitral valve is grossly normal. No evidence of mitral valve regurgitation. No evidence of mitral valve stenosis. Tricuspid Valve: The tricuspid valve is grossly normal. Tricuspid valve regurgitation is not demonstrated. Aortic Valve: The aortic valve was not well visualized. Aortic valve regurgitation is not visualized. No aortic stenosis is present. Pulmonic Valve: The pulmonic valve was not well visualized. Pulmonic valve regurgitation is not visualized. Aorta: The aortic root was not well visualized. Venous: IVC assessment for right atrial pressure unable to be performed due to mechanical ventilation. IAS/Shunts: The interatrial septum was not well visualized.  LEFT VENTRICLE PLAX 2D LVOT diam:     2.40 cm     Diastology LV SV:         87          LV e' medial:    2.94 cm/s LV SV Index:   50          LV E/e' medial:  16.3 LVOT Area:     4.52 cm    LV e' lateral:   7.29 cm/s                            LV E/e' lateral: 6.6  LV Volumes (MOD) LV vol d, MOD A2C: 81.6 ml LV vol d, MOD A4C: 58.4 ml LV vol s, MOD A2C: 25.0 ml LV vol s, MOD A4C: 21.2 ml  LV SV MOD A2C:  56.6 ml LV SV MOD A4C:     58.4 ml LV SV MOD BP:      46.3 ml RIGHT VENTRICLE            IVC RV S prime:     4.43 cm/s  IVC diam: 2.10 cm TAPSE (M-mode): 0.7 cm LEFT ATRIUM             Index        RIGHT ATRIUM           Index LA Vol (A2C):   38.8 ml 22.30 ml/m  RA Area:     10.40 cm LA Vol (A4C):   18.3 ml 10.52 ml/m  RA Volume:   19.90 ml  11.44 ml/m LA Biplane Vol: 29.4 ml 16.89 ml/m  AORTIC VALVE LVOT Vmax:   100.00 cm/s LVOT Vmean:  59.800 cm/s LVOT VTI:    0.193 m MITRAL VALVE MV Area (PHT): 2.49 cm    SHUNTS MV Decel Time: 305 msec    Systemic VTI:  0.19 m MV E velocity: 48.00 cm/s  Systemic Diam: 2.40 cm MV A velocity: 73.25 cm/s MV E/A ratio:  0.66 Epifanio Lesches MD Electronically signed by Epifanio Lesches MD Signature Date/Time: 09/12/2022/3:03:50 PM    Final    DG Abd 1 View  Result Date: 09/12/2022 CLINICAL DATA:  Nasogastric tube placement EXAM: ABDOMEN - 1 VIEW COMPARISON:  Chest radiograph of earlier today FINDINGS: Nasogastric tube has been replaced/repositioned. Terminates at the gastric fundus with side port below the gastroesophageal junction. No gross free intraperitoneal air. Airspace disease throughout the left lung again identified. Median sternotomy for CABG. Incompletely imaged endotracheal tube. IMPRESSION: Nasogastric tube terminating in the gastric fundus with side port well below the gastroesophageal junction. Electronically Signed   By: Jeronimo Greaves M.D.   On: 09/12/2022 10:26   CT HEAD WO CONTRAST ( )  Result Date: 09/12/2022 CLINICAL DATA:  Provided history: Altered mental status, nontraumatic. EXAM: CT HEAD WITHOUT CONTRAST TECHNIQUE: Contiguous axial images were obtained from the base of the skull through the vertex without intravenous contrast. RADIATION DOSE REDUCTION: This exam was performed according to the departmental dose-optimization program which includes automated exposure control, adjustment of the mA and/or kV according to  patient size and/or use of iterative reconstruction technique. COMPARISON:  Prior head CT examinations 07/03/2022 and earlier. FINDINGS: Brain: Mild generalized cerebral atrophy. Mild ill-defined hypoattenuation within the cerebral white matter, nonspecific but compatible with chronic small vessel disease. 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 There is no acute intracranial hemorrhage. No demarcated cortical infarct. No extra-axial fluid collection. No evidence of an intracranial mass. No midline shift. Vascular: No hyperdense vessel. Skull: No fracture or aggressive osseous lesion. Sinuses/Orbits: No mass or acute finding within the imaged orbits. Mild mucosal thickening within the right frontal sinus. Mild-to-moderate mucosal thickening within the left frontal sinus. Mucosal thickening scattered within the bilateral ethmoid air cells, overall mild-to-moderate in severity. Mild mucosal thickening within the right sphenoid sinus. Mild mucosal thickening within the right maxillary sinus. Other: Partially imaged ET tube. Congenital nonunion of the posterior arch of C1. IMPRESSION: 1. No evidence of acute intracranial abnormality. 2. Mild chronic small-vessel image changes within the cerebral white matter. 3. 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 4. Mild generalized cerebral atrophy. 5. Paranasal sinus disease, as described. Electronically Signed   By: Jackey Loge D.O.   On: 09/12/2022 09:38   DG Chest Port 1  View  Result Date: 09/12/2022 CLINICAL DATA:  Intubation EXAM: PORTABLE CHEST 1 VIEW COMPARISON:  07/04/2022 FINDINGS: Endotracheal tube with tip below the clavicular heads and above the carina. The enteric tube loops through the mid esophagus and then again at the lower neck with tip at the thoracic inlet. Artifact from support hardware. Infiltrate asymmetric in the left lung, similar appearance was seen on  prior. No visible effusion or pneumothorax of note. Trace pleural fluid is possible on the left. Normal heart size. CABG and left subclavian stenting. Tube positioning was directly communicated in epic. IMPRESSION: 1. Malpositioned enteric tube which loops through the esophagus and lower neck. 2. Unremarkable endotracheal tube. 3. Infiltrate in the left lung that was also noted in October and could be recurrent or persistent, need follow-up. Electronically Signed   By: Jorje Guild M.D.   On: 09/12/2022 07:38    Cardiac Studies     Patient Profile     71 y.o. male wwith a hx of CAD s/p CABGx4 in 2016, s/p stenting of Left subclavian artery [id number], chronic diastolic heart fialure, aortic atherosclerosis, peripheral vascular disease, paroxysmal atrial fibrillation, HTN, HLD, tobacco aubse, COPD, ILD, ETOH abuse who presents with NSTEMI and WCT  Assessment & Plan     NSTEMI: called EMS for chest pain and dyspnea, in respiratory distress on arrival.  Initial EKG with WCT at 125bpm.   Subsequent EKG with aVR elevation and diffuse ST depressions.  Tropinin peak 2773.  Echo with EF 60-65%, mild RV dysfunction -On Plavix, heparin gtt.  Add ASA -Continue statin -Had plan for cath once respiratory status improved but given he continues to have episodes of wide-complex tachycardia.  This was thought to be SVT but it does look like precordial concordance on EKG today and possible AV dissociation on tele, I am concerned for VT.  D/w interventional cardiology, recommend EP eval and if they feel likely VT would plan for cath today.  Would leave NPO  WCT: presented with WCT with rates 120s.  Recently admitted in 06/2022 with a similar presentation. Had episodes of wide-complex tachycardia that admission that was felt to be SVT with LBBB. He was treated with BB, but patient became bradycardic  -EKG this morning with WCT with precordial concrodance, concerned for VT.  D/w EP as above  PAF: hold home Eliquis,  continue heparin gtt  Acute respiratory failure with hypoxia/COPD exacerbation: intubated.  H/o COPD and ILD. On steroids, abx, bronchodilators per PCCM  CRITICAL CARE TIME: I have spent a total of 45 minutes with patient reviewing hospital notes, telemetry, EKGs, labs and examining the patient as well as establishing an assessment and plan.  > 50% of time was spent in direct patient care. The patient is critically ill with multi-organ system failure and requires high complexity decision making for assessment and support, frequent evaluation and titration of therapies, application of advanced monitoring technologies and extensive interpretation of multiple databases.   For questions or updates, please contact [place] Please consult [email or web address] for contact info under        Signed, Donato Heinz, MD  09/13/2022, 9:16 AM

## 2022-09-13 NOTE — Progress Notes (Signed)
eLink Physician-Brief Progress Note Patient Name: Dennis Preston DOB: 1951-04-23 MRN: [id number]   Date of Service  09/13/2022  HPI/Events of Note  Received request for PRN versed to help w/ sedation tonight.   BP 189/79 (118)  No PRNs  Patient in A fib. Having a lot of PVCs. Amio bolus?  eICU Interventions  Propofol just resumed and patient is seen with eyes closed and reportedly more relaxed as per bedside RN Continue amiodarone drip at current dose Electrolytes drawn, eLink to be informed resulted     Intervention Category Intermediate Interventions: Arrhythmia - evaluation and management Minor Interventions: Agitation / anxiety - evaluation and management  Darl Pikes 09/13/2022, 8:21 PM

## 2022-09-13 NOTE — Progress Notes (Signed)
ANTICOAGULATION CONSULT NOTE   Pharmacy Consult for heparin Indication: chest pain/ACS  Allergies  Allergen Reactions   Chlorhexidine     Patient Measurements: Height: 5\' 5"  (165.1 cm) Weight: 71.2 kg (156 lb 15.5 oz) IBW/kg (Calculated) : 61.5 Heparin Dosing Weight: TBW  Vital Signs: Temp: 98.8 F (37.1 C) (12/16 1135) Temp Source: Bladder (12/16 1135) BP: 106/51 (12/16 1200) Pulse Rate: 73 (12/16 1200)  Labs: Recent Labs    09/12/22 0656 09/12/22 0712 09/12/22 0719 09/12/22 0916 09/12/22 1016 09/12/22 1556 09/12/22 1641 09/12/22 1849 09/13/22 0050 09/13/22 0110 09/13/22 1055 09/13/22 1156  HGB 16.3 17.3*  17.3* 17.0 15.3  --   --   --   --  12.7*  --   --   --   HCT 49.2 51.0  51.0 50.0 45.0  --   --   --   --  37.8*  --   --   --   PLT 316  --   --   --   --   --   --   --  187  --   --   --   APTT 28  --   --   --   --  66*  --   --  108*  --   --  65*  LABPROT 15.7*  --   --   --   --   --   --   --   --   --   --   --   INR 1.3*  --   --   --   --   --   --   --   --   --   --   --   HEPARINUNFRC  --   --   --   --   --  >1.10*  --   --  >1.10*  --   --   --   CREATININE 1.48* 1.30*  --   --   --   --   --   --  1.18  --   --   --   TROPONINIHS 56*  --   --   --    < >  --    < > 2,773*  --  2,510* 1,008*  --    < > = values in this interval not displayed.     Estimated Creatinine Clearance: 49.9 mL/min (by C-G formula based on SCr of 1.18 mg/dL).   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  Hx afib on Eliquis PTA. Evaluation and workup underway by cardiology and EP services.  aPTT 65 on heparin (subtherapeutic) on heparin 900 u/hr  Goal of Therapy:  Heparin level 0.3-0.7 units/ml aPTT 66-102 seconds Monitor platelets by anticoagulation protocol: Yes   Plan:  Increase heparin to 950 u/hr 8hr HL and aPTT at 2100 Daily aPTT/HL until correlating Daily CBC F/u transition back to oral therapy and procedure plans   Calton Dach, PharmD Clinical Pharmacist 09/13/2022 1:20 PM

## 2022-09-13 NOTE — Progress Notes (Signed)
eLink Physician-Brief Progress Note Patient Name: Dennis Preston DOB: 1951/01/09 MRN: [id number]   Date of Service  09/13/2022  HPI/Events of Note  Phos 1.9  Mag 1.0  PIV  eICU Interventions  Na phos 15 mmol and Mg 1 g IVPB ordered BMP pending eLink to be informed of result     Intervention Category Intermediate Interventions: Electrolyte abnormality - evaluation and management  Darl Pikes 09/13/2022, 9:38 PM

## 2022-09-14 DIAGNOSIS — R000 Tachycardia, unspecified: Secondary | ICD-10-CM

## 2022-09-14 DIAGNOSIS — I214 Non-ST elevation (NSTEMI) myocardial infarction: Secondary | ICD-10-CM | POA: Diagnosis not present

## 2022-09-14 DIAGNOSIS — G9341 Metabolic encephalopathy: Secondary | ICD-10-CM | POA: Diagnosis not present

## 2022-09-14 DIAGNOSIS — I5031 Acute diastolic (congestive) heart failure: Secondary | ICD-10-CM

## 2022-09-14 DIAGNOSIS — J9601 Acute respiratory failure with hypoxia: Secondary | ICD-10-CM | POA: Diagnosis not present

## 2022-09-14 DIAGNOSIS — I480 Paroxysmal atrial fibrillation: Secondary | ICD-10-CM | POA: Diagnosis not present

## 2022-09-14 HISTORY — DX: Acute diastolic (congestive) heart failure: I50.31

## 2022-09-14 LAB — PHOSPHORUS: Phosphorus: 3.3 mg/dL (ref 2.5–4.6)

## 2022-09-14 LAB — BASIC METABOLIC PANEL
Anion gap: 9 (ref 5–15)
BUN: 27 mg/dL — ABNORMAL HIGH (ref 8–23)
CO2: 24 mmol/L (ref 22–32)
Calcium: 8.3 mg/dL — ABNORMAL LOW (ref 8.9–10.3)
Chloride: 105 mmol/L (ref 98–111)
Creatinine, Ser: 0.94 mg/dL (ref 0.61–1.24)
GFR, Estimated: >60 mL/min (ref >60)
Glucose, Bld: 126 mg/dL — ABNORMAL HIGH (ref 70–99)
Potassium: 4.1 mmol/L (ref 3.5–5.1)
Sodium: 138 mmol/L (ref 135–145)

## 2022-09-14 LAB — POCT I-STAT 7, (LYTES, BLD GAS, ICA,H+H)
Acid-Base Excess: 1 mmol/L (ref 0.0–2.0)
Bicarbonate: 23.5 mmol/L (ref 20.0–28.0)
Calcium, Ion: 1.19 mmol/L (ref 1.15–1.40)
HCT: 36 % — ABNORMAL LOW (ref 39.0–52.0)
Hemoglobin: 12.2 g/dL — ABNORMAL LOW (ref 13.0–17.0)
O2 Saturation: 100 %
Patient temperature: 98.8
Potassium: 4.3 mmol/L (ref 3.5–5.1)
Sodium: 140 mmol/L (ref 135–145)
TCO2: 24 mmol/L (ref 22–32)
pCO2 arterial: 31.4 mmHg — ABNORMAL LOW (ref 32–48)
pH, Arterial: 7.483 — ABNORMAL HIGH (ref 7.35–7.45)
pO2, Arterial: 187 mmHg — ABNORMAL HIGH (ref 83–108)

## 2022-09-14 LAB — GLUCOSE, CAPILLARY
Glucose-Capillary: 125 mg/dL — ABNORMAL HIGH (ref 70–99)
Glucose-Capillary: 144 mg/dL — ABNORMAL HIGH (ref 70–99)
Glucose-Capillary: 207 mg/dL — ABNORMAL HIGH (ref 70–99)
Glucose-Capillary: 156 mg/dL — ABNORMAL HIGH (ref 70–99)
Glucose-Capillary: 114 mg/dL — ABNORMAL HIGH (ref 70–99)

## 2022-09-14 LAB — CBC
HCT: 36.3 % — ABNORMAL LOW (ref 39.0–52.0)
Hemoglobin: 12.3 g/dL — ABNORMAL LOW (ref 13.0–17.0)
MCH: 30.8 pg (ref 26.0–34.0)
MCHC: 33.9 g/dL (ref 30.0–36.0)
MCV: 91 fL (ref 80.0–100.0)
Platelets: 166 10*3/uL (ref 150–400)
RBC: 3.99 MIL/uL — ABNORMAL LOW (ref 4.22–5.81)
RDW: 14.6 % (ref 11.5–15.5)
WBC: 12.9 10*3/uL — ABNORMAL HIGH (ref 4.0–10.5)
nRBC: 0 % (ref 0.0–0.2)

## 2022-09-14 LAB — HEPARIN LEVEL (UNFRACTIONATED): Heparin Unfractionated: >1.1 IU/mL — ABNORMAL HIGH (ref 0.30–0.70)

## 2022-09-14 LAB — MAGNESIUM: Magnesium: 2.5 mg/dL — ABNORMAL HIGH (ref 1.7–2.4)

## 2022-09-14 LAB — APTT
aPTT: 102 seconds — ABNORMAL HIGH (ref 24–36)
aPTT: 61 seconds — ABNORMAL HIGH (ref 24–36)

## 2022-09-14 MED ORDER — MORPHINE SULFATE (PF) 2 MG/ML IV SOLN
2.0000 mg | INTRAVENOUS | Status: AC | PRN
  Administered 2022-09-15 (×2): 2 mg via INTRAVENOUS
  Filled 2022-09-14 (×2): qty 1

## 2022-09-14 MED ORDER — HYDRALAZINE HCL 20 MG/ML IJ SOLN
INTRAMUSCULAR | Status: AC
  Administered 2022-09-14: 10 mg via INTRAVENOUS
  Filled 2022-09-14: qty 1

## 2022-09-14 MED ORDER — FUROSEMIDE 10 MG/ML IJ SOLN
40.0000 mg | Freq: Once | INTRAMUSCULAR | Status: AC
  Administered 2022-09-14: 40 mg via INTRAVENOUS
  Filled 2022-09-14: qty 4

## 2022-09-14 MED ORDER — LABETALOL HCL 5 MG/ML IV SOLN
INTRAVENOUS | Status: AC
  Administered 2022-09-14: 10 mg via INTRAVENOUS
  Filled 2022-09-14: qty 4

## 2022-09-14 MED ORDER — LABETALOL HCL 5 MG/ML IV SOLN: 10.0000 mg | INTRAVENOUS | Status: AC

## 2022-09-14 MED ORDER — HYDRALAZINE HCL 20 MG/ML IJ SOLN: 10.0000 mg | Freq: Once | INTRAMUSCULAR | Status: AC

## 2022-09-14 MED ORDER — HYDRALAZINE HCL 20 MG/ML IJ SOLN
10.0000 mg | INTRAMUSCULAR | Status: DC | PRN
  Administered 2022-09-15: 10 mg via INTRAVENOUS
  Filled 2022-09-14: qty 1

## 2022-09-14 MED ORDER — ORAL CARE MOUTH RINSE: 15.0000 mL | OROMUCOSAL | Status: DC | PRN

## 2022-09-14 NOTE — Progress Notes (Signed)
  Transition of Care [facility]) Screening Note   Patient Details  Name: Dennis Preston Date of Birth: July 16, 1951   Transition of Care [facility]) CM/SW Contact:    Bess Kinds, RN Phone Number: [phone] 09/14/2022, 1:11 PM    Transition of Care Department [facility]) has reviewed patient and no TOC needs have been identified at this time. We will continue to monitor patient advancement through interdisciplinary progression rounds. If new patient transition needs arise, please place a TOC consult.

## 2022-09-14 NOTE — Progress Notes (Signed)
ANTICOAGULATION CONSULT NOTE   Pharmacy Consult for heparin Indication: chest pain/ACS, hx of apixaban  Allergies  Allergen Reactions   Chlorhexidine     Patient Measurements: Height: 5\' 5"  (165.1 cm) Weight: 70.1 kg (154 lb 8.7 oz) IBW/kg (Calculated) : 61.5 Heparin Dosing Weight: TBW  Vital Signs: Temp: 97.6 F (36.4 C) (12/17 1500) Temp Source: Axillary (12/17 0700) BP: 158/76 (12/17 1300) Pulse Rate: 93 (12/17 1315)  Labs: Recent Labs    09/12/22 0656 09/12/22 0712 09/12/22 1849 09/13/22 0050 09/13/22 0110 09/13/22 1055 09/13/22 1156 09/13/22 2008 09/13/22 2153 09/14/22 0046 09/14/22 0348 09/14/22 1606  HGB 16.3   < >  --  12.7*  --   --   --   --   --  12.2* 12.3*  --   HCT 49.2   < >  --  37.8*  --   --   --   --   --  36.0* 36.3*  --   PLT 316  --   --  187  --   --   --   --   --   --  166  --   APTT 28   < >  --  108*  --   --    < > 97*  --   --  102* 61*  LABPROT 15.7*  --   --   --   --   --   --   --   --   --   --   --   INR 1.3*  --   --   --   --   --   --   --   --   --   --   --   HEPARINUNFRC  --    < >  --  >1.10*  --   --   --  >1.10*  --   --  >1.10*  --   CREATININE 1.48*   < >  --  1.18  --   --   --   --  0.92  --  0.94  --   TROPONINIHS 56*   < > 2,773*  --  2,510* 1,008*  --   --   --   --   --   --    < > = values in this interval not displayed.     Estimated Creatinine Clearance: 62.7 mL/min (by C-G formula based on SCr of 0.94 mg/dL).  Assessment: 7 YOM presenting with CP and SOB, hx CAD.  Hx afib on Eliquis PTA. Evaluation and workup underway by cardiology and EP services.   Rate reduced this afternoon and now aPTT is slightly low at 61 seconds - will increase back to 950 units/h.  Goal of Therapy:  Heparin level 0.3-0.7 units/ml aPTT 66-102 seconds Monitor platelets by anticoagulation protocol: Yes   Plan:  Increase heparin back to 950 units/h Recheck heparin level and aPTT with am labs  62, PharmD, BCPS,  [facility] Clinical Pharmacist [phone] Please check AMION for all [facility] Pharmacy numbers 09/14/2022

## 2022-09-14 NOTE — Procedures (Signed)
Extubation Procedure Note  Patient Details:   Name: Dennis Preston DOB: 05-30-1951 MRN: [id number]   Airway Documentation:    Vent end date: 09/14/22 Vent end time: 0920   Evaluation  O2 sats: stable throughout Complications: No apparent complications Patient did tolerate procedure well. Bilateral Breath Sounds: Diminished, Rhonchi   Yes  Pt extubated to BIPAP with RN & CCM at bedside. Positive cuff leak noted. Pt is tolerating okay, RT will monitor.   Forest Becker 09/14/2022, 9:37 AM

## 2022-09-14 NOTE — Progress Notes (Signed)
eLink Physician-Brief Progress Note Patient Name: Dennis Preston DOB: 1951-01-27 MRN: [id number]   Date of Service  09/14/2022  HPI/Events of Note  ABG 7.48/31/187 H/H 12.2/36 Patient not breathing over the set rate of 26 on the vent Since H/H stable bleed unlikely and patient is seen waking up with SBP 110s. Off propofol and Fentanyl at 200  eICU Interventions  Decrease rate to 22. Discussed with RT Discussed with BSRN to come down on Fentanyl as may be having ileus from he opiates and to titrate up propofol slowly as tolerated     Intervention Category Intermediate Interventions: Diagnostic test evaluation  Darl Pikes 09/14/2022, 12:51 AM

## 2022-09-14 NOTE — Progress Notes (Signed)
eLink Physician-Brief Progress Note Patient Name: Dennis Preston DOB: 1951-05-22 MRN: [id number]   Date of Service  09/14/2022  HPI/Events of Note  Patients states that he is nervous and also wants something to help him sleep. Shaking w/ BP in the 190's. No PRNs. NPO  Patient seen awake on BiPap  eICU Interventions  Morphine ordered so as to help with sensation of dyspnea in this patient with COPD     Intervention Category Minor Interventions: Agitation / anxiety - evaluation and management  Darl Pikes 09/14/2022, 11:46 PM

## 2022-09-14 NOTE — Progress Notes (Signed)
Pt placed on PS/CPAP 5/5 on 40% and is tolerating well. RT will monitor. 

## 2022-09-14 NOTE — Progress Notes (Signed)
Electrophysiology Progress Note  Patient Name: Dennis Preston Date of Encounter: 09/14/2022    Subjective   Pt intubated, sedated and unable to give a history.  Inpatient Medications    Scheduled Meds:  albuterol  2.5 mg Nebulization TID   aspirin  81 mg Per Tube Daily   atorvastatin  80 mg Per Tube Daily   clopidogrel  75 mg Per Tube Daily   feeding supplement (PROSource TF20)  60 mL Per Tube Daily   fentaNYL (SUBLIMAZE) injection  25 mcg Intravenous Once   insulin aspart  0-15 Units Subcutaneous Q4H   methylPREDNISolone (SOLU-MEDROL) injection  40 mg Intravenous Q12H   nicotine  21 mg Transdermal Daily   mouth rinse  15 mL Mouth Rinse Q2H   pantoprazole (PROTONIX) IV  40 mg Intravenous Q24H   Continuous Infusions:  sodium chloride Stopped (09/13/22 1330)   amiodarone 30 mg/hr (09/14/22 0600)   azithromycin Stopped (09/13/22 1100)   cefTRIAXone (ROCEPHIN)  IV Stopped (09/14/22 0002)   dexmedetomidine (PRECEDEX) IV infusion Stopped (09/13/22 1720)   feeding supplement (VITAL 1.5 CAL) Stopped (09/13/22 0930)   fentaNYL infusion INTRAVENOUS 150 mcg/hr (09/14/22 0600)   heparin 950 Units/hr (09/14/22 0600)   propofol (DIPRIVAN) infusion 30 mcg/kg/min (09/14/22 0600)   PRN Meds: albuterol, docusate, fentaNYL, mouth rinse, polyethylene glycol   Vital Signs    Vitals:   09/14/22 0515 09/14/22 0530 09/14/22 0545 09/14/22 0600  BP:    (!) 101/54  Pulse: (!) 49 (!) 50 (!) 54 (!) 46  Resp: (!) 21 (!) 21 20 (!) 22  Temp:      TempSrc:      SpO2: 96% 96% 96% 96%  Weight:      Height:        Intake/Output Summary (Last 24 hours) at 09/14/2022 0739 Last data filed at 09/14/2022 0600 Gross per 24 hour  Intake 2290.63 ml  Output 800 ml  Net 1490.63 ml   Filed Weights   09/12/22 0941 09/13/22 0500 09/14/22 0444  Weight: 67.1 kg 71.2 kg 70.1 kg    Physical Exam    Gen - appears comfortable on the ventilator, sedated Pulm: mechanical ventilation.  Heart:Brady,  regular rhythm MSK: mild edema  Labs    CBC Recent Labs    09/12/22 0656 09/12/22 0712 09/13/22 0050 09/14/22 0046 09/14/22 0348  WBC 20.5*  --  12.0*  --  12.9*  NEUTROABS 13.2*  --   --   --   --   HGB 16.3   < > 12.7* 12.2* 12.3*  HCT 49.2   < > 37.8* 36.0* 36.3*  MCV 94.8  --  92.4  --  91.0  PLT 316  --  187  --  166   < > = values in this interval not displayed.   Basic Metabolic Panel Recent Labs    09/81/19 2008 09/13/22 2153 09/14/22 0046 09/14/22 0348  NA  --  142 140 138  K  --  4.1 4.3 4.1  CL  --  109  --  105  CO2  --  23  --  24  GLUCOSE  --  153*  --  126*  BUN  --  29*  --  27*  CREATININE  --  0.92  --  0.94  CALCIUM  --  8.3*  --  8.3*  MG 1.0*  --   --  2.5*  PHOS 1.9*  --   --  3.3   Liver Function Tests  Recent Labs    09/12/22 0656  AST 22  ALT 20  ALKPHOS 65  BILITOT 0.4  PROT 6.7  ALBUMIN 4.2   No results for input(s): "LIPASE", "AMYLASE" in the last 72 hours. Cardiac Enzymes No results for input(s): "CKTOTAL", "CKMB", "CKMBINDEX", "TROPONINI" in the last 72 hours. BNP Invalid input(s): "POCBNP" D-Dimer No results for input(s): "DDIMER" in the last 72 hours. Hemoglobin A1C Recent Labs    09/12/22 0656  HGBA1C 6.2*   Fasting Lipid Panel Recent Labs    09/12/22 0656  CHOL 120  HDL 56  LDLCALC 45  TRIG 96  CHOLHDL 2.1   Thyroid Function Tests No results for input(s): "TSH", "T4TOTAL", "T3FREE", "THYROIDAB" in the last 72 hours.  Invalid input(s): "FREET3"  Telemetry    Sinus bradycardia, now with rare ectopy (personally reviewed)  Radiology       Patient Profile     Dennis Preston is a 71 y.o. male with a past medical history significant for  CAD s/p CABG x4, left subclavian stent, chronic diastolic heart fialure, aortic atherosclerosis, peripheral vascular disease, paroxysmal atrial fibrillation, HTN, HLD, tobacco aubse, COPD, ILD, ETOH abuse admitted with NSTEMI, WCT, and respiratory failure  who is being  seen today for the evaluation of WCT at the request of Dr. Gardiner Rhyme.   Assessment & Plan    1.  Wide-complex tachycardia: I suspect this is aberrant conduction of supraventricular rhythms which can be any (or all) of the following -- sinus tachycardia, atrial ectopy, atrial flutter. He now appears to be maintaining sinus rhythm on amiodarone. Continue amiodarone for now. I don't think he will need to be discharged on amiodarone when his acute illness is resolved. He has now had two admissions recently with some form of tachycardia. It would be reasonable to place a monitor as an outpatient.   2.  NSTEMI: diffuse ST elevation, Avr elevation, prolonged QT. hsTnI peaked at 2.7k and now declining. No repeat ECG yet today. ST changes appear improved on telemetry.   3. Prolonged QT -  in setting of ischemia. Improved. Avoid QT prolonging drugs.    4. History of PAF: possible flutter on telemetry though difficult to tell with broad QRS-T. Starting amiodarone. On heparin gtt   5. Acute hypoxic respiratory failure - history of COPD  EP will sign off. Please call with any questions or concerns  For questions or updates, please contact [place] Please consult [email or web address] for contact info under Cardiology/STEMI.  Signed, Melida Quitter, MD 09/14/2022, 7:39 AM

## 2022-09-14 NOTE — Progress Notes (Signed)
NAME:  Dennis Preston, MRN:  [id number], DOB:  May 19, 1951, LOS: 2 ADMISSION DATE:  09/12/2022, CONSULTATION DATE: 09/12/2022 REFERRING MD: Emergency department physician, CHIEF COMPLAINT: Hypoxic respiratory failure, coronary artery disease, wide-complex tachycardia  History of Present Illness:  Mr. Dennis Preston 71 year old male with extensive past medical history is well-documented below.  Of note he stopped smoking and drinking for the last several months.  He was admitted to medicine hospital in October for similar episode.  He has been at [facility] for subclavian steal with stent placement.  EMS was notified due to shortness of breath decreased level of consciousness while in the ambulance he required bagging to maintain adequate ventilation on arrival to [facility] emergency department he was intubated.  He did have a wide-complex tachycardia that is now resolved.  He had bedside evaluation by cardiology he may or may not go to Cath Lab.  We will admit to the intensive care unit at this time.  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, SVG-OM1, SVG-PDA, SVG-PL   Hyperlipemia    Hypertension    Leucocytosis    Liver dysfunction    Long term current use of clopidogrel    NSTEMI (non-ST elevated myocardial infarction) ([place])    approx 2016   Peripheral artery disease (HCC)    legs   Tobacco abuse    Wears dentures    full upper and lower     Significant Hospital Events: Including procedures, antibiotic start and stop dates in addition to other pertinent events   09/12/2022 intubated Emergency Department  Interim  History / Subjective:  Patient continued to have intermittent wide-complex tachycardia, denies chest pain He is very anxious, his heart rate drops to 40s but he once he is awake it goes to 130s Also his blood pressure is very labile depends on his anxiety level ranging from MAP of 50-120s  Objective   Blood pressure (!) 101/54, pulse (!) 46, temperature 97.6 F (36.4 C), temperature source Axillary, resp. rate (!) 22, height 5\' 5"  (1.651 m), weight 70.1 kg, SpO2 96 %.    Vent Mode: PSV;CPAP FiO2 (%):  [40 %] 40 % Set Rate:  [22 bmp-26 bmp] 22 bmp Vt Set:  [490 mL] 490 mL PEEP:  [5 cmH20] 5 cmH20 Pressure Support:  [5 cmH20] 5 cmH20 Plateau Pressure:  [15 cmH20-32 cmH20] 30 cmH20   Intake/Output Summary (Last 24 hours) at 09/14/2022 0936 Last data filed at 09/14/2022 0800 Gross per 24 hour  Intake 2064.27 ml  Output 675 ml  Net 1389.27 ml   Filed Weights   09/12/22 0941 09/13/22 0500 09/14/22 0444  Weight: 67.1 kg 71.2 kg 70.1 kg    Examination: Physical exam: General: Crtitically ill-appearing male, orally intubated HEENT: [place]/AT, eyes anicteric.  ETT and OGT in place Neuro: Anxious, awake, following commands, moving all 4 extremities Chest: Bilateral expiratory wheezes, no rhonchi or crackles Heart: Tachycardic, no murmurs or gallops Abdomen: Soft, nondistended, bowel sounds present Skin: No rash  Resolved Hospital Problem list   Acute respiratory acidosis  Assessment & Plan:  Acute on chronic hypoxic/hypercapnic respiratory failure Acute COPD exacerbation Continue lung protective ventilation VAP prevention bundle in place Hypercapnia has improved Patient is tolerating spontaneous breathing trial, will try to extubate him He is anxious at baseline, will be placed on BiPAP afterwards PAD protocol with propofol and fentanyl, RASS: 0 Continue Solu-Medrol Continue nebs Continue ceftriaxone and azithromycin to complete 5 days therapy  Acute metabolic encephalopathy  in the setting of hypercapnia Patient mental status has improved, he is awake after clearance of hypercapnia Minimize sedation  Acute HFpEF and right ventricular systolic heart failure Wide-complex tachycardia Acute NSTEMI Paroxysmal A-fib Hypertension Hyperlipidemia Coronary artery disease s/p CABG Echocardiogram was done which showed EF of 65% with grade 1 diastolic dysfunction Right ventricular function is slightly depressed Patient continues to have intermittent wide-complex tachycardia once he is more awake  EKG was repeated consistent with left bundle branch block His troponins were elevated Cardiology is following, yesterday seen by EP cardiology, recommend starting amiodarone Plan for cardiac cath early part of next week Continue aspirin, Plavix and heparin infusion His blood pressure is labile, continue as needed meds  Acute kidney injury Hypophosphatemia/hypomagnesemia Serum creatinine trended down Continue aggressive electrolyte supplement and monitor  Best Practice (right click and "Reselect all SmartList Selections" daily)   Diet/type: NPO once extubated will do speech and swallow evaluation DVT prophylaxis: systemic heparin GI prophylaxis: PPI Lines: N/A Foley:  N/A Code Status:  full code Last date of multidisciplinary goals of care discussion  09/12/2022 0800 hrs. wife and son updated at bedside.  Code discussion started at this time they want full code.      This patient is critically ill with multiple organ system failure which requires frequent high complexity decision making, assessment, support, evaluation, and titration of therapies. This was completed through the application of advanced monitoring technologies and extensive interpretation of multiple databases.  During this encounter critical care time was devoted to patient care services described in this note for 40 minutes.    Cheri Fowler, MD [facility] See Amion for  pager If no response to pager, please call [phone] until 7pm After 7pm, Please call E-link [phone]

## 2022-09-14 NOTE — Progress Notes (Signed)
eLink Physician-Brief Progress Note Patient Name: Dennis Preston DOB: January 21, 1951 MRN: [id number]   Date of Service  09/14/2022  HPI/Events of Note  Sudden drop in BP. 154/69 to 81/42 within an hour. No PRN given. 30 prop 200 Fent  Propofol was at 40, minimally responsive Pupils equal Reportedly abdomen somewhat distended Patient is on heparin drip Glucose 160s  eICU Interventions  Suspect propofol effect instructed to turn down to 10 or off Repeat ABG to include H/H If no significant improvement and with drop in H/H would need head and abdominal CT to rule out bleed     Intervention Category Major Interventions: Hypotension - evaluation and management  Darl Pikes 09/14/2022, 12:34 AM

## 2022-09-14 NOTE — Progress Notes (Signed)
ANTICOAGULATION CONSULT NOTE   Pharmacy Consult for heparin Indication: chest pain/ACS, hx of apixaban  Allergies  Allergen Reactions   Chlorhexidine     Patient Measurements: Height: 5\' 5"  (165.1 cm) Weight: 70.1 kg (154 lb 8.7 oz) IBW/kg (Calculated) : 61.5 Heparin Dosing Weight: TBW  Vital Signs: Temp: 97.6 F (36.4 C) (12/17 0700) Temp Source: Axillary (12/17 0700) BP: 101/54 (12/17 0600) Pulse Rate: 46 (12/17 0600)  Labs: Recent Labs    09/12/22 0656 09/12/22 0712 09/12/22 1849 09/13/22 0050 09/13/22 0110 09/13/22 1055 09/13/22 1156 09/13/22 2008 09/13/22 2153 09/14/22 0046 09/14/22 0348  HGB 16.3   < >  --  12.7*  --   --   --   --   --  12.2* 12.3*  HCT 49.2   < >  --  37.8*  --   --   --   --   --  36.0* 36.3*  PLT 316  --   --  187  --   --   --   --   --   --  166  APTT 28   < >  --  108*  --   --  65* 97*  --   --  102*  LABPROT 15.7*  --   --   --   --   --   --   --   --   --   --   INR 1.3*  --   --   --   --   --   --   --   --   --   --   HEPARINUNFRC  --    < >  --  >1.10*  --   --   --  >1.10*  --   --  >1.10*  CREATININE 1.48*   < >  --  1.18  --   --   --   --  0.92  --  0.94  TROPONINIHS 56*   < > 2,773*  --  2,510* 1,008*  --   --   --   --   --    < > = values in this interval not displayed.     Estimated Creatinine Clearance: 62.7 mL/min (by C-G formula based on SCr of 0.94 mg/dL).  Assessment: 26 YOM presenting with CP and SOB, hx CAD.  Hx afib on Eliquis PTA. Evaluation and workup underway by cardiology and EP services.   S/p rate increase to 950 units/hr aPTT is at high end of range 102 seconds, Anti-Xa level remains elevated as expected from Eliquis dose  Goal of Therapy:  Heparin level 0.3-0.7 units/ml aPTT 66-102 seconds Monitor platelets by anticoagulation protocol: Yes   Plan:  Reduce heparin gtt to 900 units/hr Recheck aPTT this afternoon Daily aPTT/HL, CBC, s/s bleeding  62, PharmD, BCCCP Clinical  Pharmacist  Please check AMION for all [facility] Pharmacy numbers  09/14/2022 7:50 AM

## 2022-09-14 NOTE — Progress Notes (Signed)
Rounding Note    Patient Name: Dennis Preston Date of Encounter: 09/14/2022  [place] Cardiologist: None   Subjective   Creatinine stable at 0.9, hemoglobin 12.3.  Extubated this AM, now on Bipap.  Follows commands but not answering questions  Inpatient Medications    Scheduled Meds:  albuterol  2.5 mg Nebulization TID   aspirin  81 mg Per Tube Daily   atorvastatin  80 mg Per Tube Daily   clopidogrel  75 mg Per Tube Daily   feeding supplement (PROSource TF20)  60 mL Per Tube Daily   fentaNYL (SUBLIMAZE) injection  25 mcg Intravenous Once   insulin aspart  0-15 Units Subcutaneous Q4H   methylPREDNISolone (SOLU-MEDROL) injection  40 mg Intravenous Q12H   nicotine  21 mg Transdermal Daily   mouth rinse  15 mL Mouth Rinse Q2H   pantoprazole (PROTONIX) IV  40 mg Intravenous Q24H   Continuous Infusions:  sodium chloride Stopped (09/13/22 1330)   amiodarone 30 mg/hr (09/14/22 1000)   azithromycin 125 mL/hr at 09/14/22 1000   cefTRIAXone (ROCEPHIN)  IV Stopped (09/14/22 0002)   dexmedetomidine (PRECEDEX) IV infusion Stopped (09/13/22 1720)   feeding supplement (VITAL 1.5 CAL) Stopped (09/13/22 0930)   fentaNYL infusion INTRAVENOUS Stopped (09/14/22 0909)   heparin 900 Units/hr (09/14/22 1000)   propofol (DIPRIVAN) infusion Stopped (09/14/22 0901)   PRN Meds: albuterol, docusate, fentaNYL, hydrALAZINE, mouth rinse, polyethylene glycol   Vital Signs    Vitals:   09/14/22 1130 09/14/22 1145 09/14/22 1146 09/14/22 1200  BP:      Pulse: 94 90  90  Resp: (!) 30 (!) 27  (!) 28  Temp:   97.8 F (36.6 C)   TempSrc:      SpO2: 91% 93%  93%  Weight:      Height:        Intake/Output Summary (Last 24 hours) at 09/14/2022 1303 Last data filed at 09/14/2022 1000 Gross per 24 hour  Intake 1574.14 ml  Output 675 ml  Net 899.14 ml       09/14/2022    4:44 AM 09/13/2022    5:00 AM 09/12/2022    9:41 AM  Last 3 Weights  Weight (lbs) 154 lb 8.7 oz 156 lb  15.5 oz 147 lb 14.9 oz  Weight (kg) 70.1 kg 71.2 kg 67.1 kg      Telemetry    Sinus rhythm, episode of SVT this AM to 130s - Personally Reviewed  ECG    No New ECG - Personally Reviewed  Physical Exam   GEN: on Bipap Neck: + JVD Cardiac: RRR, no murmurs Respiratory: Rhonchi GI: Soft, nontender MS: No edema Neuro:  Follows commands, not answering questions Psych: Unable to assess  Labs    High Sensitivity Troponin:   Recent Labs  Lab 09/12/22 1315 09/12/22 1641 09/12/22 1849 09/13/22 0110 09/13/22 1055  TROPONINIHS 1,925* 2,604* 2,773* 2,510* 1,008*      Chemistry Recent Labs  Lab 09/12/22 0656 09/12/22 0712 09/13/22 0050 09/13/22 2008 09/13/22 2153 09/14/22 0046 09/14/22 0348  NA 140   < > 139  --  142 140 138  K 4.3   < > 4.2  --  4.1 4.3 4.1  CL 99   < > 106  --  109  --  105  CO2 31  --  24  --  23  --  24  GLUCOSE 258*   < > 144*  --  153*  --  126*  BUN 17   < > 28*  --  29*  --  27*  CREATININE 1.48*   < > 1.18  --  0.92  --  0.94  CALCIUM 9.0  --  8.5*  --  8.3*  --  8.3*  MG  --    < > 2.0 1.0*  --   --  2.5*  PROT 6.7  --   --   --   --   --   --   ALBUMIN 4.2  --   --   --   --   --   --   AST 22  --   --   --   --   --   --   ALT 20  --   --   --   --   --   --   ALKPHOS 65  --   --   --   --   --   --   BILITOT 0.4  --   --   --   --   --   --   GFRNONAA 50*  --  >60  --  >60  --  >60  ANIONGAP 10  --  9  --  10  --  9   < > = values in this interval not displayed.     Lipids  Recent Labs  Lab 09/12/22 0656  CHOL 120  TRIG 96  HDL 56  LDLCALC 45  CHOLHDL 2.1     Hematology Recent Labs  Lab 09/12/22 0656 09/12/22 0712 09/13/22 0050 09/14/22 0046 09/14/22 0348  WBC 20.5*  --  12.0*  --  12.9*  RBC 5.19  --  4.09*  --  3.99*  HGB 16.3   < > 12.7* 12.2* 12.3*  HCT 49.2   < > 37.8* 36.0* 36.3*  MCV 94.8  --  92.4  --  91.0  MCH 31.4  --  31.1  --  30.8  MCHC 33.1  --  33.6  --  33.9  RDW 13.9  --  14.3  --  14.6  PLT  316  --  187  --  166   < > = values in this interval not displayed.    Thyroid No results for input(s): "TSH", "FREET4" in the last 168 hours.  BNP Recent Labs  Lab 09/12/22 1016  BNP 63.0     DDimer No results for input(s): "DDIMER" in the last 168 hours.   Radiology    ECHOCARDIOGRAM COMPLETE  Result Date: 09/12/2022    ECHOCARDIOGRAM REPORT   Patient Name:   JAMEEL ORTMANN Date of Exam: 09/12/2022 Medical Rec #:  [id number]       Height:       65.0 in Accession #:    [id number]      Weight:       147.9 lb Date of Birth:  1950/12/10      BSA:          1.740 m Patient Age:    76 years        BP:           98/53 mmHg Patient Gender: M               HR:           65 bpm. Exam Location:  Inpatient Procedure: 2D Echo, Cardiac Doppler, Color Doppler and Intracardiac  Opacification Agent Indications:    122-I22.9 Subsequent ST elevation (STEM) and non-ST elevation                 (NSTEMI) myocardial infarction  History:        Patient has prior history of Echocardiogram examinations, most                 recent 07/04/2022. CAD, Abnormal ECG and Prior CABG,                 Arrythmias:Atrial Fibrillation and VT, Signs/Symptoms:Shortness                 of Breath, Dyspnea and Chest Pain; Risk Factors:Hypertension.                 Pulmonary edema.  Sonographer:    Roseanna Rainbow RDCS Referring Phys: Margie Billet  Sonographer Comments: Technically difficult study due to poor echo windows, suboptimal parasternal window, suboptimal subcostal window and echo performed with patient supine and on artificial respirator. Image acquisition challenging due to patient body habitus and Image acquisition challenging due to COPD. IMPRESSIONS  1. Left ventricular ejection fraction, by estimation, is 60 to 65%. The left ventricle has normal function. The left ventricle has no regional wall motion abnormalities. Left ventricular diastolic parameters are consistent with Grade I diastolic dysfunction (impaired  relaxation).  2. Right ventricular systolic function is mildly reduced. The right ventricular size is normal.  3. The mitral valve is grossly normal. No evidence of mitral valve regurgitation. No evidence of mitral stenosis.  4. The aortic valve was not well visualized. Aortic valve regurgitation is not visualized. No aortic stenosis is present. FINDINGS  Left Ventricle: Left ventricular ejection fraction, by estimation, is 60 to 65%. The left ventricle has normal function. The left ventricle has no regional wall motion abnormalities. The left ventricular internal cavity size was normal in size. There is  no left ventricular hypertrophy. Left ventricular diastolic parameters are consistent with Grade I diastolic dysfunction (impaired relaxation). Right Ventricle: The right ventricular size is normal. No increase in right ventricular wall thickness. Right ventricular systolic function is mildly reduced. Left Atrium: Left atrial size was normal in size. Right Atrium: Right atrial size was normal in size. Pericardium: Trivial pericardial effusion is present. Mitral Valve: The mitral valve is grossly normal. No evidence of mitral valve regurgitation. No evidence of mitral valve stenosis. Tricuspid Valve: The tricuspid valve is grossly normal. Tricuspid valve regurgitation is not demonstrated. Aortic Valve: The aortic valve was not well visualized. Aortic valve regurgitation is not visualized. No aortic stenosis is present. Pulmonic Valve: The pulmonic valve was not well visualized. Pulmonic valve regurgitation is not visualized. Aorta: The aortic root was not well visualized. Venous: IVC assessment for right atrial pressure unable to be performed due to mechanical ventilation. IAS/Shunts: The interatrial septum was not well visualized.  LEFT VENTRICLE PLAX 2D LVOT diam:     2.40 cm     Diastology LV SV:         87          LV e' medial:    2.94 cm/s LV SV Index:   50          LV E/e' medial:  16.3 LVOT Area:     4.52  cm    LV e' lateral:   7.29 cm/s  LV E/e' lateral: 6.6  LV Volumes (MOD) LV vol d, MOD A2C: 81.6 ml LV vol d, MOD A4C: 58.4 ml LV vol s, MOD A2C: 25.0 ml LV vol s, MOD A4C: 21.2 ml LV SV MOD A2C:     56.6 ml LV SV MOD A4C:     58.4 ml LV SV MOD BP:      46.3 ml RIGHT VENTRICLE            IVC RV S prime:     4.43 cm/s  IVC diam: 2.10 cm TAPSE (M-mode): 0.7 cm LEFT ATRIUM             Index        RIGHT ATRIUM           Index LA Vol (A2C):   38.8 ml 22.30 ml/m  RA Area:     10.40 cm LA Vol (A4C):   18.3 ml 10.52 ml/m  RA Volume:   19.90 ml  11.44 ml/m LA Biplane Vol: 29.4 ml 16.89 ml/m  AORTIC VALVE LVOT Vmax:   100.00 cm/s LVOT Vmean:  59.800 cm/s LVOT VTI:    0.193 m MITRAL VALVE MV Area (PHT): 2.49 cm    SHUNTS MV Decel Time: 305 msec    Systemic VTI:  0.19 m MV E velocity: 48.00 cm/s  Systemic Diam: 2.40 cm MV A velocity: 73.25 cm/s MV E/A ratio:  0.66 Epifanio Lesches MD Electronically signed by Epifanio Lesches MD Signature Date/Time: 09/12/2022/3:03:50 PM    Final     Cardiac Studies     Patient Profile     71 y.o. male wwith a hx of CAD s/p CABGx4 in 2016, s/p stenting of Left subclavian artery 2023, chronic diastolic heart fialure, aortic atherosclerosis, peripheral vascular disease, paroxysmal atrial fibrillation, HTN, HLD, tobacco aubse, COPD, ILD, ETOH abuse who presents with NSTEMI and WCT  Assessment & Plan    NSTEMI: called EMS for chest pain and dyspnea, in respiratory distress on arrival.  Initial EKG with WCT at 125bpm.   Subsequent EKG with aVR elevation and diffuse ST depressions.  Tropinin peak 2773.  Echo with EF 60-65%, mild RV dysfunction -Continue ASA, Plavix, heparin gtt.   -Continue statin -Plan for cath once respiratory status improves.  Asked EP to see him on 12/16 as concerned that episodes of WCT could be VT, in which case would recommend more urgent cath.  Evaluated by Dr Nelly Laurence, [facility] consistent with SVT.  No urgent indication for  cath at this time, will plan for cath once pulmonary issues improved  SVT: presented with WCT with rates 120s.  Recently admitted in 06/2022 with a similar presentation. Had episodes of wide-complex tachycardia that admission that was felt to be SVT with LBBB. He was treated with BB, but patient became bradycardic  -D/w EP as above: WCT consistent with SVT.  Recommended amio gtt.  SVT has improved since starting amio, will continue  PAF: hold home Eliquis, continue heparin gtt  Acute respiratory failure with hypoxia/COPD exacerbation: extubated, currently on Bipap.  H/o COPD and ILD. On steroids, abx, bronchodilators per PCCM -Suspect diastolic heart failure contributing.  Elevated JVD on exam today, will give IV lasix 40 mg   CRITICAL CARE TIME: I have spent a total of 35 minutes with patient reviewing hospital notes, telemetry, EKGs, labs and examining the patient as well as establishing an assessment and plan that was discussed with the patient's wife.  > 50% of time was spent in direct patient care.  The patient is critically ill and requires high complexity decision making for assessment and support, frequent evaluation and titration of therapies, application of advanced monitoring technologies and extensive interpretation of multiple databases.  For questions or updates, please contact [place] Please consult [email or web address] for contact info under        Signed, Donato Heinz, MD  09/14/2022, 1:03 PM

## 2022-09-15 DIAGNOSIS — I5031 Acute diastolic (congestive) heart failure: Secondary | ICD-10-CM

## 2022-09-15 DIAGNOSIS — R7989 Other specified abnormal findings of blood chemistry: Secondary | ICD-10-CM | POA: Diagnosis not present

## 2022-09-15 DIAGNOSIS — I471 Supraventricular tachycardia, unspecified: Secondary | ICD-10-CM | POA: Diagnosis not present

## 2022-09-15 DIAGNOSIS — I480 Paroxysmal atrial fibrillation: Secondary | ICD-10-CM

## 2022-09-15 DIAGNOSIS — I214 Non-ST elevation (NSTEMI) myocardial infarction: Secondary | ICD-10-CM | POA: Diagnosis not present

## 2022-09-15 DIAGNOSIS — J9601 Acute respiratory failure with hypoxia: Secondary | ICD-10-CM | POA: Diagnosis not present

## 2022-09-15 LAB — CBC
HCT: 40.1 % (ref 39.0–52.0)
Hemoglobin: 13.4 g/dL (ref 13.0–17.0)
MCH: 31.4 pg (ref 26.0–34.0)
MCHC: 33.4 g/dL (ref 30.0–36.0)
MCV: 93.9 fL (ref 80.0–100.0)
Platelets: 168 10*3/uL (ref 150–400)
RBC: 4.27 MIL/uL (ref 4.22–5.81)
RDW: 14.9 % (ref 11.5–15.5)
WBC: 13.7 10*3/uL — ABNORMAL HIGH (ref 4.0–10.5)
nRBC: 0 % (ref 0.0–0.2)

## 2022-09-15 LAB — BASIC METABOLIC PANEL
Anion gap: 7 (ref 5–15)
BUN: 30 mg/dL — ABNORMAL HIGH (ref 8–23)
CO2: 27 mmol/L (ref 22–32)
Calcium: 8.4 mg/dL — ABNORMAL LOW (ref 8.9–10.3)
Chloride: 107 mmol/L (ref 98–111)
Creatinine, Ser: 0.94 mg/dL (ref 0.61–1.24)
GFR, Estimated: >60 mL/min (ref >60)
Glucose, Bld: 112 mg/dL — ABNORMAL HIGH (ref 70–99)
Potassium: 4.4 mmol/L (ref 3.5–5.1)
Sodium: 141 mmol/L (ref 135–145)

## 2022-09-15 LAB — GLUCOSE, CAPILLARY
Glucose-Capillary: 103 mg/dL — ABNORMAL HIGH (ref 70–99)
Glucose-Capillary: 106 mg/dL — ABNORMAL HIGH (ref 70–99)
Glucose-Capillary: 114 mg/dL — ABNORMAL HIGH (ref 70–99)
Glucose-Capillary: 116 mg/dL — ABNORMAL HIGH (ref 70–99)
Glucose-Capillary: 118 mg/dL — ABNORMAL HIGH (ref 70–99)
Glucose-Capillary: 118 mg/dL — ABNORMAL HIGH (ref 70–99)
Glucose-Capillary: 76 mg/dL (ref 70–99)

## 2022-09-15 LAB — MAGNESIUM: Magnesium: 2.5 mg/dL — ABNORMAL HIGH (ref 1.7–2.4)

## 2022-09-15 LAB — TROPONIN I (HIGH SENSITIVITY)
Troponin I (High Sensitivity): 1982 ng/L (ref <18)
Troponin I (High Sensitivity): 1643 ng/L (ref <18)

## 2022-09-15 LAB — APTT: aPTT: 88 seconds — ABNORMAL HIGH (ref 24–36)

## 2022-09-15 LAB — HEPARIN LEVEL (UNFRACTIONATED): Heparin Unfractionated: 0.92 IU/mL — ABNORMAL HIGH (ref 0.30–0.70)

## 2022-09-15 MED ORDER — ASPIRIN 81 MG PO CHEW
81.0000 mg | CHEWABLE_TABLET | Freq: Every day | ORAL | Status: DC
  Administered 2022-09-15: 81 mg via ORAL
  Filled 2022-09-15 (×2): qty 1

## 2022-09-15 MED ORDER — PANTOPRAZOLE SODIUM 40 MG PO TBEC
40.0000 mg | DELAYED_RELEASE_TABLET | Freq: Every day | ORAL | Status: DC
  Administered 2022-09-16 – 2022-09-19 (×4): 40 mg via ORAL
  Filled 2022-09-15 (×4): qty 1

## 2022-09-15 MED ORDER — CLOPIDOGREL BISULFATE 75 MG PO TABS: 75.0000 mg | ORAL_TABLET | Freq: Every day | ORAL | Status: DC

## 2022-09-15 MED ORDER — ASPIRIN 81 MG PO CHEW: 81.0000 mg | CHEWABLE_TABLET | Freq: Every day | ORAL | Status: DC

## 2022-09-15 MED ORDER — ALPRAZOLAM 0.5 MG PO TABS
0.2500 mg | ORAL_TABLET | Freq: Once | ORAL | Status: AC
  Administered 2022-09-15: 0.25 mg via ORAL
  Filled 2022-09-15: qty 1

## 2022-09-15 MED ORDER — FUROSEMIDE 40 MG PO TABS
40.0000 mg | ORAL_TABLET | Freq: Every day | ORAL | Status: DC
  Administered 2022-09-15: 40 mg via ORAL
  Filled 2022-09-15: qty 1

## 2022-09-15 MED ORDER — ATORVASTATIN CALCIUM 80 MG PO TABS
80.0000 mg | ORAL_TABLET | Freq: Every day | ORAL | Status: DC
  Administered 2022-09-15 – 2022-09-20 (×5): 80 mg via ORAL
  Filled 2022-09-15: qty 2
  Filled 2022-09-15: qty 1
  Filled 2022-09-15: qty 2
  Filled 2022-09-15 (×2): qty 1

## 2022-09-15 MED ORDER — FUROSEMIDE 40 MG PO TABS
40.0000 mg | ORAL_TABLET | Freq: Every day | ORAL | Status: DC
  Administered 2022-09-16 – 2022-09-17 (×2): 40 mg via ORAL
  Filled 2022-09-15 (×2): qty 1

## 2022-09-15 MED ORDER — SODIUM CHLORIDE 0.9% FLUSH
3.0000 mL | Freq: Two times a day (BID) | INTRAVENOUS | Status: DC
  Administered 2022-09-15 – 2022-09-20 (×8): 3 mL via INTRAVENOUS

## 2022-09-15 MED ORDER — METHYLPREDNISOLONE SODIUM SUCC 40 MG IJ SOLR
40.0000 mg | Freq: Every day | INTRAMUSCULAR | Status: DC
  Administered 2022-09-16 – 2022-09-18 (×3): 40 mg via INTRAVENOUS
  Filled 2022-09-15 (×3): qty 1

## 2022-09-15 MED ORDER — CLOPIDOGREL BISULFATE 75 MG PO TABS
75.0000 mg | ORAL_TABLET | Freq: Every day | ORAL | Status: DC
  Administered 2022-09-15 – 2022-09-20 (×6): 75 mg via ORAL
  Filled 2022-09-15 (×6): qty 1

## 2022-09-15 MED ORDER — NITROGLYCERIN 0.4 MG SL SUBL: 0.4000 mg | SUBLINGUAL_TABLET | SUBLINGUAL | Status: DC | PRN

## 2022-09-15 MED ORDER — ATORVASTATIN CALCIUM 40 MG PO TABS: 80.0000 mg | ORAL_TABLET | Freq: Every day | ORAL | Status: DC

## 2022-09-15 MED ORDER — DOCUSATE SODIUM 100 MG PO CAPS
100.0000 mg | ORAL_CAPSULE | Freq: Two times a day (BID) | ORAL | Status: DC | PRN
  Administered 2022-09-15: 100 mg via ORAL
  Filled 2022-09-15: qty 1

## 2022-09-15 NOTE — Progress Notes (Signed)
NAME:  Dennis Preston, MRN:  [id number], DOB:  [birth year], LOS: 3 ADMISSION DATE:  09/12/2022, CONSULTATION DATE: 09/12/2022 REFERRING MD: Emergency department physician, CHIEF COMPLAINT: Hypoxic respiratory failure, coronary artery disease, wide-complex tachycardia  History of Present Illness:  Mr. Dennis Preston 71 year old male with extensive past medical history is well-documented below.  Of note he stopped smoking and drinking for the last several months.  He was admitted to medicine hospital in October for similar episode.  He has been at [facility] for subclavian steal with stent placement.  EMS was notified due to shortness of breath decreased level of consciousness while in the ambulance he required bagging to maintain adequate ventilation on arrival to [facility] emergency department he was intubated.  He did have a wide-complex tachycardia that is now resolved.  He had bedside evaluation by cardiology he may or may not go to Cath Lab.  We will admit to the intensive care unit at this time.  Pertinent  Medical History   Past Medical History:  Diagnosis Date   Alcohol abuse    Amaurosis fugax    Aortic atherosclerosis (HCC)    Atrial fibrillation (HCC)    Carotid stenosis    CHF (congestive heart failure) (HCC)    Coagulopathy (HCC)    COPD (chronic obstructive pulmonary disease) ([place])    Coronary artery disease    Depression    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, SVG-OM1, SVG-PDA, SVG-PL   Hyperlipemia    Hypertension    Leucocytosis    Liver dysfunction    Long term current use of clopidogrel    NSTEMI (non-ST elevated myocardial infarction) ([place])    approx 2016   Peripheral artery disease (HCC)    legs   Tobacco abuse    Wears dentures    full upper and lower     Significant Hospital Events: Including procedures, antibiotic start and stop dates in addition to other pertinent events   09/12/2022 intubated Emergency Department 09/14/2022  extubated   Interim History / Subjective:  Continue to be anxious, morphine ordered for dyspnea. HR remains variable. Between 50-110 with wide complex tachycardia. Labile Bps. Off BiPAP now on 3L [place].  Reports dyspnea improved this morning and feeling thirsty. Had 15 minutes of mid chest pain this morning but now resolved.  Objective   Blood pressure (!) 155/69, pulse (!) 57, temperature 99 F (37.2 C), temperature source Rectal, resp. rate 14, height 5\' 5"  (1.651 m), weight 70.1 kg, SpO2 95 %.    Vent Mode: PCV;BIPAP FiO2 (%):  [40 %] 40 % Set Rate:  [18 bmp] 18 bmp PEEP:  [5 cmH20] 5 cmH20 Pressure Support:  [10 cmH20] 10 cmH20   Intake/Output Summary (Last 24 hours) at 09/15/2022 0936 Last data filed at 09/15/2022 0500 Gross per 24 hour  Intake 936.36 ml  Output 2425 ml  Net -1488.64 ml    Filed Weights   09/12/22 0941 09/13/22 0500 09/14/22 0444  Weight: 67.1 kg 71.2 kg 70.1 kg    Examination: Physical exam: General: Chronically male, on 3L [place] HEENT: [name], eyes anicteric.   Neuro: Anxious,  following commands, moving all 4 extremities, no focal deficients  Chest: Coarse bilateral wheezing, no rhonchi or crackles Heart: Normal rate, no murmurs or gallops, JVD to neck, no LE edema Abdomen: Soft, nondistended, bowel sounds present Skin: No rash  Resolved Hospital Problem list   Acute respiratory acidosis Hypophosphatemia/hypomagnesemia  Assessment & Plan:  Acute on chronic hypoxic/hypercapnic respiratory  failure Acute COPD exacerbation Continue on oxygen to maintain saturations between 89-92% On 1.5-2L baseline mostly at night Continue Solu-Medrol Continue nebs Continue ceftriaxone and azithromycin to complete 5 days therapy day 4/5  Acute metabolic encephalopathy in the setting of hypercapnia Patient mental status has improved, he is awake after clearance of hypercapnia  Acute HFpEF and right ventricular systolic heart failure Wide-complex tachycardia Acute  NSTEMI Paroxysmal A-fib Hypertension Hyperlipidemia Coronary artery disease s/p CABG Echocardiogram was done which showed EF of 65% with grade 1 diastolic dysfunction Right ventricular function is slightly depressed Patient continues to have intermittent wide-complex tachycardia once he is more awake  EKG was repeated consistent with left bundle branch block His troponins were elevated ON lasix 40 mg twice daily at home, restart lasix 40 mg daily Continue on amiodarone per  Respiratory status improved will discuss timing of cath with cardiology Continue aspirin, Plavix and heparin infusion His blood pressure is labile, continue as needed meds  Acute kidney injury Serum creatinine trended down, appears new baseline 0.8 Restarted on lasix Continue to monitor  Best Practice (right click and "Reselect all SmartList Selections" daily)   Diet/type: Cleared bedside swallow, restart regular diet if not having procedure today DVT prophylaxis: systemic heparin GI prophylaxis: PPI Lines: N/A Foley:  N/A Code Status:  full code Last date of multidisciplinary goals of care discussion  09/12/2022 0800 hrs. wife and son updated at bedside.  Code discussion started at this time they want full code.    Quincy Simmonds, MD IMTS,  PGY-3  Pager [phone]

## 2022-09-15 NOTE — Progress Notes (Signed)
PT Cancellation Note  Patient Details Name: Dennis Preston MRN: [id number] DOB: September 17, 1951   Cancelled Treatment:    Reason Eval/Treat Not Completed: Patient declined, no reason specified (pt stating he's not ready yet and "doesn't want to get excited")   Stan Cantave B Derrell Milanes 09/15/2022, 9:55 AM Merryl Hacker, PT Acute Rehabilitation Services Office: [phone]

## 2022-09-15 NOTE — Progress Notes (Signed)
PT Cancellation Note  Patient Details Name: Dennis Preston MRN: [id number] DOB: 02/21/1951   Cancelled Treatment:    Reason Eval/Treat Not Completed: Patient not medically ready (pt with elevated troponin and await downtrending value)   Darrian Goodwill B Vercie Pokorny 09/15/2022, 12:49 PM Merryl Hacker, PT Acute Rehabilitation Services Office: [phone]

## 2022-09-15 NOTE — Progress Notes (Signed)
eLink Physician-Brief Progress Note Patient Name: [name] DOB: [birth year] MRN: [id number]   Date of Service  09/15/2022  HPI/Events of Note  Patient c/o anxiety. Currently on [place] O2 with sat = 92% and RR = 19.  eICU Interventions  Plan: Xanax 0.25 mg PO X 1.      Intervention Category Major Interventions: Delirium, psychosis, severe agitation - evaluation and management  Tara Rud Eugene 09/15/2022, 9:10 PM

## 2022-09-15 NOTE — Progress Notes (Signed)
Pt taken off bipap at this time and placed on 3L nasal cannula without complication. RN aware of changes. RT will continue to be available as needed.

## 2022-09-15 NOTE — Progress Notes (Signed)
Rounding Note    Patient Name: Dennis Preston Date of Encounter: 09/15/2022  [facility] Cardiologist: None   Subjective   Feels that he is breathing better.  No palpitations.   Inpatient Medications    Scheduled Meds:  [START ON 09/16/2022] aspirin  81 mg Oral Daily   [START ON 09/16/2022] atorvastatin  80 mg Oral Daily   [START ON 09/16/2022] clopidogrel  75 mg Oral Daily   furosemide  40 mg Oral Daily   insulin aspart  0-15 Units Subcutaneous Q4H   [START ON 09/16/2022] methylPREDNISolone (SOLU-MEDROL) injection  40 mg Intravenous Daily   nicotine  21 mg Transdermal Daily   pantoprazole (PROTONIX) IV  40 mg Intravenous Q24H   Continuous Infusions:  sodium chloride 10 mL/hr at 09/15/22 0500   amiodarone 30 mg/hr (09/15/22 1058)   azithromycin 500 mg (09/15/22 1040)   cefTRIAXone (ROCEPHIN)  IV Stopped (09/14/22 2222)   dexmedetomidine (PRECEDEX) IV infusion Stopped (09/13/22 1720)   fentaNYL infusion INTRAVENOUS Stopped (09/14/22 0909)   heparin 950 Units/hr (09/15/22 0500)   PRN Meds: albuterol, docusate sodium, fentaNYL, hydrALAZINE, nitroGLYCERIN, mouth rinse, polyethylene glycol   Vital Signs    Vitals:   09/15/22 0445 09/15/22 0500 09/15/22 0743 09/15/22 0850  BP:      Pulse: 64 (!) 57    Resp: (!) 21 14    Temp:   99 F (37.2 C)   TempSrc:   Rectal   SpO2: 97% 98%  95%  Weight:      Height:        Intake/Output Summary (Last 24 hours) at 09/15/2022 1235 Last data filed at 09/15/2022 0500 Gross per 24 hour  Intake 903.34 ml  Output 1625 ml  Net -721.66 ml      09/14/2022    4:44 AM 09/13/2022    5:00 AM 09/12/2022    9:41 AM  Last 3 Weights  Weight (lbs) 154 lb 8.7 oz 156 lb 15.5 oz 147 lb 14.9 oz  Weight (kg) 70.1 kg 71.2 kg 67.1 kg      Telemetry    Normal sinus rhythm with occasional PACs- Personally Reviewed  ECG    Normal sinus rhythm, left bundle branch block- Personally Reviewed  Physical Exam   GEN: No acute  distress.   Neck: No JVD Cardiac: RRR, no murmurs, rubs, or gallops.  Respiratory: Wheezing to auscultation bilaterally. GI: Soft, nontender, non-distended  MS: No edema; No deformity. Neuro:  Nonfocal  Psych: Normal affect   Labs    High Sensitivity Troponin:   Recent Labs  Lab 09/12/22 1641 09/12/22 1849 09/13/22 0110 09/13/22 1055 09/15/22 0935  TROPONINIHS 2,604* 2,773* 2,510* 1,008* 1,982*     Chemistry Recent Labs  Lab 09/12/22 0737 09/12/22 1062 09/13/22 2008 09/13/22 2153 09/14/22 0046 09/14/22 0348 09/15/22 0325  NA 140   < >  --  142 140 138 141  K 4.3   < >  --  4.1 4.3 4.1 4.4  CL 99   < >  --  109  --  105 107  CO2 31   < >  --  23  --  24 27  GLUCOSE 258*   < >  --  153*  --  126* 112*  BUN 17   < >  --  29*  --  27* 30*  CREATININE 1.48*   < >  --  0.92  --  0.94 0.94  CALCIUM 9.0   < >  --  8.3*  --  8.3* 8.4*  MG  --    < > 1.0*  --   --  2.5* 2.5*  PROT 6.7  --   --   --   --   --   --   ALBUMIN 4.2  --   --   --   --   --   --   AST 22  --   --   --   --   --   --   ALT 20  --   --   --   --   --   --   ALKPHOS 65  --   --   --   --   --   --   BILITOT 0.4  --   --   --   --   --   --   GFRNONAA 50*   < >  --  >60  --  >60 >60  ANIONGAP 10   < >  --  10  --  9 7   < > = values in this interval not displayed.    Lipids  Recent Labs  Lab 09/12/22 0656  CHOL 120  TRIG 96  HDL 56  LDLCALC 45  CHOLHDL 2.1    Hematology Recent Labs  Lab 09/13/22 0050 09/14/22 0046 09/14/22 0348 09/15/22 0325  WBC 12.0*  --  12.9* 13.7*  RBC 4.09*  --  3.99* 4.27  HGB 12.7* 12.2* 12.3* 13.4  HCT 37.8* 36.0* 36.3* 40.1  MCV 92.4  --  91.0 93.9  MCH 31.1  --  30.8 31.4  MCHC 33.6  --  33.9 33.4  RDW 14.3  --  14.6 14.9  PLT 187  --  166 168   Thyroid No results for input(s): "TSH", "FREET4" in the last 168 hours.  BNP Recent Labs  Lab 09/12/22 1016  BNP 63.0    DDimer No results for input(s): "DDIMER" in the last 168 hours.   Radiology     No results found.  Cardiac Studies   Echo result reviewed.  Technically difficult study with normal EF and normal valvular function  Patient Profile     71 y.o. male SVT and elevated troponin who had hypoxic respiratory failure.  Assessment & Plan    Elevated troponin: Plan for cardiac cath tomorrow.  All questions about the procedure answered.  SVT: No sustained arrhythmias.  PACs noted.  He is on IV amiodarone.  Depending on cath result, we will get EP input on to further treatment of arrhythmia.  There was a concern for VT at 1 point but the rhythm was thought to be due to SVT.     For questions or updates, please contact [facility] Please consult [email or web address] for contact info under        Signed, Lance Muss, MD  09/15/2022, 12:35 PM

## 2022-09-15 NOTE — Progress Notes (Signed)
ANTICOAGULATION CONSULT NOTE   Pharmacy Consult for heparin Indication: chest pain/ACS, hx of apixaban  Allergies  Allergen Reactions   Chlorhexidine     Patient Measurements: Height: 5\' 5"  (165.1 cm) Weight: 70.1 kg (154 lb 8.7 oz) IBW/kg (Calculated) : 61.5 Heparin Dosing Weight: TBW  Vital Signs: Temp: 99 F (37.2 C) (12/18 0743) Temp Source: Rectal (12/18 0743) BP: 155/69 (12/18 0400) Pulse Rate: 57 (12/18 0500)  Labs: Recent Labs    09/12/22 1556 09/12/22 1849 09/13/22 0050 09/13/22 0110 09/13/22 1055 09/13/22 1156 09/13/22 2008 09/13/22 2153 09/14/22 0046 09/14/22 0348 09/14/22 1606 09/15/22 0325  HGB  --   --  12.7*  --   --   --   --   --  12.2* 12.3*  --  13.4  HCT  --   --  37.8*  --   --   --   --   --  36.0* 36.3*  --  40.1  PLT  --   --  187  --   --   --   --   --   --  166  --  168  APTT  --   --  108*  --   --    < > 97*  --   --  102* 61* 88*  HEPARINUNFRC  --   --  >1.10*  --   --   --  >1.10*  --   --  >1.10*  --  0.92*  CREATININE   < >  --  1.18  --   --   --   --  0.92  --  0.94  --  0.94  TROPONINIHS  --  2,773*  --  2,510* 1,008*  --   --   --   --   --   --   --    < > = values in this interval not displayed.    Estimated Creatinine Clearance: 62.7 mL/min (by C-G formula based on SCr of 0.94 mg/dL).  Assessment: 12 YOM presenting with CP and SOB, hx CAD.  Hx afib on Eliquis PTA. Evaluation and workup underway by cardiology and EP services.   aPTT at 88 - therapeutic for goal (Heparin level still elevated due to recent Eliquis) on current rate 950 units/hr.   CBC within normal limits. SCr improved.   Goal of Therapy:  Heparin level 0.3-0.7 units/ml aPTT 66-102 seconds Monitor platelets by anticoagulation protocol: Yes   Plan:  Continue heparin back to 950 units/h Recheck heparin level and aPTT with am labs  62, PharmD, BCPS, BCCCP Please refer to [facility] for [facility] Pharmacy numbers 09/15/2022

## 2022-09-15 NOTE — Progress Notes (Signed)
Dr notified @1130  of critical troponin. Awaiting orders

## 2022-09-15 NOTE — H&P (View-Only) (Signed)
Rounding Note    Patient Name: Dennis Preston Date of Encounter: 09/15/2022  [facility] Cardiologist: None   Subjective   Feels that he is breathing better.  No palpitations.   Inpatient Medications    Scheduled Meds:  [START ON 09/16/2022] aspirin  81 mg Oral Daily   [START ON 09/16/2022] atorvastatin  80 mg Oral Daily   [START ON 09/16/2022] clopidogrel  75 mg Oral Daily   furosemide  40 mg Oral Daily   insulin aspart  0-15 Units Subcutaneous Q4H   [START ON 09/16/2022] methylPREDNISolone (SOLU-MEDROL) injection  40 mg Intravenous Daily   nicotine  21 mg Transdermal Daily   pantoprazole (PROTONIX) IV  40 mg Intravenous Q24H   Continuous Infusions:  sodium chloride 10 mL/hr at 09/15/22 0500   amiodarone 30 mg/hr (09/15/22 1058)   azithromycin 500 mg (09/15/22 1040)   cefTRIAXone (ROCEPHIN)  IV Stopped (09/14/22 2222)   dexmedetomidine (PRECEDEX) IV infusion Stopped (09/13/22 1720)   fentaNYL infusion INTRAVENOUS Stopped (09/14/22 0909)   heparin 950 Units/hr (09/15/22 0500)   PRN Meds: albuterol, docusate sodium, fentaNYL, hydrALAZINE, nitroGLYCERIN, mouth rinse, polyethylene glycol   Vital Signs    Vitals:   09/15/22 0445 09/15/22 0500 09/15/22 0743 09/15/22 0850  BP:      Pulse: 64 (!) 57    Resp: (!) 21 14    Temp:   99 F (37.2 C)   TempSrc:   Rectal   SpO2: 97% 98%  95%  Weight:      Height:        Intake/Output Summary (Last 24 hours) at 09/15/2022 1235 Last data filed at 09/15/2022 0500 Gross per 24 hour  Intake 903.34 ml  Output 1625 ml  Net -721.66 ml      09/14/2022    4:44 AM 09/13/2022    5:00 AM 09/12/2022    9:41 AM  Last 3 Weights  Weight (lbs) 154 lb 8.7 oz 156 lb 15.5 oz 147 lb 14.9 oz  Weight (kg) 70.1 kg 71.2 kg 67.1 kg      Telemetry    Normal sinus rhythm with occasional PACs- Personally Reviewed  ECG    Normal sinus rhythm, left bundle branch block- Personally Reviewed  Physical Exam   GEN: No acute  distress.   Neck: No JVD Cardiac: RRR, no murmurs, rubs, or gallops.  Respiratory: Wheezing to auscultation bilaterally. GI: Soft, nontender, non-distended  MS: No edema; No deformity. Neuro:  Nonfocal  Psych: Normal affect   Labs    High Sensitivity Troponin:   Recent Labs  Lab 09/12/22 1641 09/12/22 1849 09/13/22 0110 09/13/22 1055 09/15/22 0935  TROPONINIHS 2,604* 2,773* 2,510* 1,008* 1,982*     Chemistry Recent Labs  Lab 09/12/22 0737 09/12/22 1062 09/13/22 2008 09/13/22 2153 09/14/22 0046 09/14/22 0348 09/15/22 0325  NA 140   < >  --  142 140 138 141  K 4.3   < >  --  4.1 4.3 4.1 4.4  CL 99   < >  --  109  --  105 107  CO2 31   < >  --  23  --  24 27  GLUCOSE 258*   < >  --  153*  --  126* 112*  BUN 17   < >  --  29*  --  27* 30*  CREATININE 1.48*   < >  --  0.92  --  0.94 0.94  CALCIUM 9.0   < >  --  8.3*  --  8.3* 8.4*  MG  --    < > 1.0*  --   --  2.5* 2.5*  PROT 6.7  --   --   --   --   --   --   ALBUMIN 4.2  --   --   --   --   --   --   AST 22  --   --   --   --   --   --   ALT 20  --   --   --   --   --   --   ALKPHOS 65  --   --   --   --   --   --   BILITOT 0.4  --   --   --   --   --   --   GFRNONAA 50*   < >  --  >60  --  >60 >60  ANIONGAP 10   < >  --  10  --  9 7   < > = values in this interval not displayed.    Lipids  Recent Labs  Lab 09/12/22 0656  CHOL 120  TRIG 96  HDL 56  LDLCALC 45  CHOLHDL 2.1    Hematology Recent Labs  Lab 09/13/22 0050 09/14/22 0046 09/14/22 0348 09/15/22 0325  WBC 12.0*  --  12.9* 13.7*  RBC 4.09*  --  3.99* 4.27  HGB 12.7* 12.2* 12.3* 13.4  HCT 37.8* 36.0* 36.3* 40.1  MCV 92.4  --  91.0 93.9  MCH 31.1  --  30.8 31.4  MCHC 33.6  --  33.9 33.4  RDW 14.3  --  14.6 14.9  PLT 187  --  166 168   Thyroid No results for input(s): "TSH", "FREET4" in the last 168 hours.  BNP Recent Labs  Lab 09/12/22 1016  BNP 63.0    DDimer No results for input(s): "DDIMER" in the last 168 hours.   Radiology     No results found.  Cardiac Studies   Echo result reviewed.  Technically difficult study with normal EF and normal valvular function  Patient Profile     71 y.o. male SVT and elevated troponin who had hypoxic respiratory failure.  Assessment & Plan    Elevated troponin: Plan for cardiac cath tomorrow.  All questions about the procedure answered.  SVT: No sustained arrhythmias.  PACs noted.  He is on IV amiodarone.  Depending on cath result, we will get EP input on to further treatment of arrhythmia.  There was a concern for VT at 1 point but the rhythm was thought to be due to SVT.     For questions or updates, please contact [facility] Please consult [email or web address] for contact info under        Signed, Lance Muss, MD  09/15/2022, 12:35 PM

## 2022-09-16 ENCOUNTER — Inpatient Hospital Stay (HOSPITAL_COMMUNITY)
Admission: EM | Disposition: A | Discharge status: Home Health | Payer: Self-pay | Source: Home / Self Care | Attending: Internal Medicine

## 2022-09-16 DIAGNOSIS — I25709 Atherosclerosis of coronary artery bypass graft(s), unspecified, with unspecified angina pectoris: Secondary | ICD-10-CM

## 2022-09-16 DIAGNOSIS — I5031 Acute diastolic (congestive) heart failure: Secondary | ICD-10-CM | POA: Diagnosis not present

## 2022-09-16 DIAGNOSIS — I214 Non-ST elevation (NSTEMI) myocardial infarction: Secondary | ICD-10-CM | POA: Diagnosis not present

## 2022-09-16 DIAGNOSIS — J9601 Acute respiratory failure with hypoxia: Secondary | ICD-10-CM | POA: Diagnosis not present

## 2022-09-16 HISTORY — PX: LEFT HEART CATH AND CORONARY ANGIOGRAPHY: CATH118249

## 2022-09-16 LAB — BASIC METABOLIC PANEL
Anion gap: 5 (ref 5–15)
BUN: 22 mg/dL (ref 8–23)
CO2: 31 mmol/L (ref 22–32)
Calcium: 8.3 mg/dL — ABNORMAL LOW (ref 8.9–10.3)
Chloride: 105 mmol/L (ref 98–111)
Creatinine, Ser: 0.98 mg/dL (ref 0.61–1.24)
GFR, Estimated: >60 mL/min (ref >60)
Glucose, Bld: 98 mg/dL (ref 70–99)
Potassium: 4 mmol/L (ref 3.5–5.1)
Sodium: 141 mmol/L (ref 135–145)

## 2022-09-16 LAB — CBC
HCT: 37.6 % — ABNORMAL LOW (ref 39.0–52.0)
Hemoglobin: 12.4 g/dL — ABNORMAL LOW (ref 13.0–17.0)
MCH: 30.8 pg (ref 26.0–34.0)
MCHC: 33 g/dL (ref 30.0–36.0)
MCV: 93.3 fL (ref 80.0–100.0)
Platelets: 156 10*3/uL (ref 150–400)
RBC: 4.03 MIL/uL — ABNORMAL LOW (ref 4.22–5.81)
RDW: 14.5 % (ref 11.5–15.5)
WBC: 10.3 10*3/uL (ref 4.0–10.5)
nRBC: 0 % (ref 0.0–0.2)

## 2022-09-16 LAB — GLUCOSE, CAPILLARY
Glucose-Capillary: 142 mg/dL — ABNORMAL HIGH (ref 70–99)
Glucose-Capillary: 101 mg/dL — ABNORMAL HIGH (ref 70–99)
Glucose-Capillary: 85 mg/dL (ref 70–99)
Glucose-Capillary: 76 mg/dL (ref 70–99)
Glucose-Capillary: 167 mg/dL — ABNORMAL HIGH (ref 70–99)
Glucose-Capillary: 173 mg/dL — ABNORMAL HIGH (ref 70–99)
Glucose-Capillary: 118 mg/dL — ABNORMAL HIGH (ref 70–99)

## 2022-09-16 LAB — HEPARIN LEVEL (UNFRACTIONATED): Heparin Unfractionated: 1.1 IU/mL — ABNORMAL HIGH (ref 0.30–0.70)

## 2022-09-16 LAB — TROPONIN I (HIGH SENSITIVITY): Troponin I (High Sensitivity): 2175 ng/L (ref <18)

## 2022-09-16 LAB — APTT: aPTT: 149 seconds — ABNORMAL HIGH (ref 24–36)

## 2022-09-16 SURGERY — LEFT HEART CATH AND CORONARY ANGIOGRAPHY
Anesthesia: LOCAL

## 2022-09-16 MED ORDER — MIDAZOLAM HCL 2 MG/2ML IJ SOLN
INTRAMUSCULAR | Status: DC | PRN
  Administered 2022-09-16: 1 mg via INTRAVENOUS

## 2022-09-16 MED ORDER — FENTANYL CITRATE (PF) 100 MCG/2ML IJ SOLN
INTRAMUSCULAR | Status: DC | PRN
  Administered 2022-09-16: 25 ug via INTRAVENOUS

## 2022-09-16 MED ORDER — APIXABAN 5 MG PO TABS
5.0000 mg | ORAL_TABLET | Freq: Two times a day (BID) | ORAL | Status: DC
  Administered 2022-09-17 – 2022-09-20 (×7): 5 mg via ORAL
  Filled 2022-09-16 (×7): qty 1

## 2022-09-16 MED ORDER — LABETALOL HCL 5 MG/ML IV SOLN: 10.0000 mg | INTRAVENOUS | Status: AC | PRN

## 2022-09-16 MED ORDER — LIDOCAINE HCL (PF) 1 % IJ SOLN
INTRAMUSCULAR | Status: AC
  Filled 2022-09-16: qty 30

## 2022-09-16 MED ORDER — SODIUM CHLORIDE 0.9% FLUSH
3.0000 mL | Freq: Two times a day (BID) | INTRAVENOUS | Status: DC
  Administered 2022-09-16 (×2): 3 mL via INTRAVENOUS

## 2022-09-16 MED ORDER — HYDRALAZINE HCL 20 MG/ML IJ SOLN
INTRAMUSCULAR | Status: AC
  Filled 2022-09-16: qty 1

## 2022-09-16 MED ORDER — ASPIRIN 81 MG PO CHEW: 81.0000 mg | CHEWABLE_TABLET | ORAL | Status: DC

## 2022-09-16 MED ORDER — IOHEXOL 350 MG/ML SOLN
INTRAVENOUS | Status: DC | PRN
  Administered 2022-09-16: 120 mL

## 2022-09-16 MED ORDER — ADULT MULTIVITAMIN W/MINERALS CH
1.0000 | ORAL_TABLET | Freq: Every day | ORAL | Status: DC
  Administered 2022-09-16 – 2022-09-20 (×5): 1 via ORAL
  Filled 2022-09-16 (×5): qty 1

## 2022-09-16 MED ORDER — HYDRALAZINE HCL 20 MG/ML IJ SOLN
INTRAMUSCULAR | Status: DC | PRN
  Administered 2022-09-16: 5 mg via INTRAVENOUS

## 2022-09-16 MED ORDER — HEPARIN (PORCINE) IN NACL 1000-0.9 UT/500ML-% IV SOLN
INTRAVENOUS | Status: DC | PRN
  Administered 2022-09-16 (×2): 500 mL

## 2022-09-16 MED ORDER — SODIUM CHLORIDE 0.9 % WEIGHT BASED INFUSION: 1.0000 mL/kg/h | INTRAVENOUS | Status: DC

## 2022-09-16 MED ORDER — SODIUM CHLORIDE 0.9 % IV SOLN
INTRAVENOUS | Status: DC | PRN
  Administered 2022-09-16: 10 mL/h via INTRAVENOUS

## 2022-09-16 MED ORDER — SODIUM CHLORIDE 0.9% FLUSH: 3.0000 mL | INTRAVENOUS | Status: DC | PRN

## 2022-09-16 MED ORDER — ASPIRIN 81 MG PO CHEW: 81.0000 mg | CHEWABLE_TABLET | Freq: Every day | ORAL | Status: DC

## 2022-09-16 MED ORDER — ACETAMINOPHEN 325 MG PO TABS: 650.0000 mg | ORAL_TABLET | ORAL | Status: DC | PRN

## 2022-09-16 MED ORDER — DIAZEPAM 5 MG PO TABS
5.0000 mg | ORAL_TABLET | Freq: Four times a day (QID) | ORAL | Status: DC | PRN
  Administered 2022-09-16 – 2022-09-19 (×6): 5 mg via ORAL
  Filled 2022-09-16 (×2): qty 1
  Filled 2022-09-16 (×3): qty 3
  Filled 2022-09-16 (×2): qty 1

## 2022-09-16 MED ORDER — SODIUM CHLORIDE 0.9 % WEIGHT BASED INFUSION: 3.0000 mL/kg/h | INTRAVENOUS | Status: DC

## 2022-09-16 MED ORDER — MIDAZOLAM HCL 2 MG/2ML IJ SOLN
INTRAMUSCULAR | Status: AC
  Filled 2022-09-16: qty 2

## 2022-09-16 MED ORDER — HEPARIN SODIUM (PORCINE) 5000 UNIT/ML IJ SOLN: 5000.0000 [IU] | Freq: Three times a day (TID) | INTRAMUSCULAR | Status: DC

## 2022-09-16 MED ORDER — ASPIRIN 81 MG PO CHEW
81.0000 mg | CHEWABLE_TABLET | ORAL | Status: AC
  Administered 2022-09-16: 81 mg via ORAL

## 2022-09-16 MED ORDER — HYDRALAZINE HCL 20 MG/ML IJ SOLN: 10.0000 mg | INTRAMUSCULAR | Status: AC | PRN

## 2022-09-16 MED ORDER — ENSURE ENLIVE PO LIQD
237.0000 mL | Freq: Two times a day (BID) | ORAL | Status: DC
  Administered 2022-09-16 – 2022-09-20 (×7): 237 mL via ORAL

## 2022-09-16 MED ORDER — VERAPAMIL HCL 2.5 MG/ML IV SOLN
INTRAVENOUS | Status: AC
  Filled 2022-09-16: qty 2

## 2022-09-16 MED ORDER — HEPARIN (PORCINE) IN NACL 1000-0.9 UT/500ML-% IV SOLN
INTRAVENOUS | Status: AC
  Filled 2022-09-16: qty 1000

## 2022-09-16 MED ORDER — SODIUM CHLORIDE 0.9 % IV SOLN: 250.0000 mL | INTRAVENOUS | Status: DC | PRN

## 2022-09-16 MED ORDER — LIDOCAINE HCL (PF) 1 % IJ SOLN
INTRAMUSCULAR | Status: DC | PRN
  Administered 2022-09-16: 20 mL

## 2022-09-16 MED ORDER — FENTANYL CITRATE (PF) 100 MCG/2ML IJ SOLN
INTRAMUSCULAR | Status: AC
  Filled 2022-09-16: qty 2

## 2022-09-16 MED ORDER — AMIODARONE HCL IN DEXTROSE 360-4.14 MG/200ML-% IV SOLN
INTRAVENOUS | Status: AC
  Filled 2022-09-16: qty 200

## 2022-09-16 MED ORDER — ONDANSETRON HCL 4 MG/2ML IJ SOLN: 4.0000 mg | Freq: Four times a day (QID) | INTRAMUSCULAR | Status: DC | PRN

## 2022-09-16 MED ORDER — SODIUM CHLORIDE 0.9% FLUSH
3.0000 mL | INTRAVENOUS | Status: DC | PRN
  Administered 2022-09-18: 3 mL via INTRAVENOUS

## 2022-09-16 MED ORDER — SODIUM CHLORIDE 0.9 % IV SOLN: INTRAVENOUS | Status: AC

## 2022-09-16 MED ORDER — HEPARIN SODIUM (PORCINE) 1000 UNIT/ML IJ SOLN
INTRAMUSCULAR | Status: AC
  Filled 2022-09-16: qty 10

## 2022-09-16 SURGICAL SUPPLY — 16 items
CATH ANGIO 5F BER2 100CM (CATHETERS) IMPLANT
CATH INFINITI 5 FR IM (CATHETERS) IMPLANT
CATH INFINITI 5 FR LCB (CATHETERS) IMPLANT
CATH INFINITI 5FR MULTPACK ANG (CATHETERS) IMPLANT
ELECT DEFIB PAD ADLT CADENCE (PAD) IMPLANT
GUIDEWIRE ANGLED .035X150CM (WIRE) IMPLANT
GUIDEWIRE INQWIRE 1.5J.035X260 (WIRE) IMPLANT
INQWIRE 1.5J .035X260CM (WIRE) ×1
KIT HEART LEFT (KITS) ×1 IMPLANT
MAT PREVALON FULL STRYKER (MISCELLANEOUS) IMPLANT
PACK CARDIAC CATHETERIZATION (CUSTOM PROCEDURE TRAY) ×1 IMPLANT
SHEATH PINNACLE 5F 10CM (SHEATH) IMPLANT
TRANSDUCER W/STOPCOCK (MISCELLANEOUS) ×1 IMPLANT
TUBING CIL FLEX 10 FLL-RA (TUBING) ×1 IMPLANT
WIRE EMERALD 3MM-J .035X150CM (WIRE) IMPLANT
WIRE HI TORQ VERSACORE-J 145CM (WIRE) IMPLANT

## 2022-09-16 NOTE — Progress Notes (Signed)
ANTICOAGULATION CONSULT NOTE   Pharmacy Consult for heparin Indication: chest pain/ACS, hx of apixaban  Allergies  Allergen Reactions   Chlorhexidine     Patient Measurements: Height: 5\' 5"  (165.1 cm) Weight: 75.5 kg (166 lb 7.2 oz) IBW/kg (Calculated) : 61.5 Heparin Dosing Weight: TBW  Vital Signs: Temp: 98.8 F (37.1 C) (12/19 0817) Temp Source: Oral (12/19 0323) BP: 127/51 (12/19 0600) Pulse Rate: 45 (12/19 0600)  Labs: Recent Labs    09/14/22 0348 09/14/22 1606 09/15/22 0325 09/15/22 0935 09/15/22 1150 09/16/22 0411  HGB 12.3*  --  13.4  --   --  12.4*  HCT 36.3*  --  40.1  --   --  37.6*  PLT 166  --  168  --   --  156  APTT 102* 61* 88*  --   --  149*  HEPARINUNFRC >1.10*  --  0.92*  --   --  1.10*  CREATININE 0.94  --  0.94  --   --  0.98  TROPONINIHS  --   --   --  1,982* 1,643* 2,175*    Estimated Creatinine Clearance: 65.6 mL/min (by C-G formula based on SCr of 0.98 mg/dL).  Assessment: 57 YOM presenting with CP and SOB, hx CAD.  Hx afib on Eliquis PTA. Evaluation and workup underway by cardiology and EP services.   aPTT prolonged this AM at 149 and Heparin level at 1.10 despite continuing current rate. May be error but patient going for cath today so will turn off for now to prevent overt bleeding during cath.   CBC within normal limits. SCr improved.   Goal of Therapy:  Heparin level 0.3-0.7 units/ml aPTT 66-102 seconds Monitor platelets by anticoagulation protocol: Yes   Plan:  Stop Heparin with prolonged levels (stopped ~8:20) Cath planned for 9 AM.  Alerted cath lab team so they are aware.  Follow-up post cath.   62, PharmD, BCPS, BCCCP Please refer to [facility] for [facility] Pharmacy numbers 09/16/2022

## 2022-09-16 NOTE — Progress Notes (Signed)
51f sheath removed from rt femoral artery post cath.  Manual pressure appied to rt groin for 20 min.  No hematoma, palpable rt DP pulse, bp 117/47. Hr 62.  Post sheath removal instructions given. Pt acknowledges and understands.  Bedrest starts at 12 noon.

## 2022-09-16 NOTE — Progress Notes (Signed)
Rounding Note    Patient Name: Dennis Preston Date of Encounter: 09/16/2022  [place] Cardiologist: None   Subjective   S/p cath.  No bleeding at femoral site  Inpatient Medications    Scheduled Meds:  [START ON 09/17/2022] apixaban  5 mg Oral BID   atorvastatin  80 mg Oral Daily   clopidogrel  75 mg Oral Daily   feeding supplement  237 mL Oral BID BM   furosemide  40 mg Oral Daily   insulin aspart  0-15 Units Subcutaneous Q4H   methylPREDNISolone (SOLU-MEDROL) injection  40 mg Intravenous Daily   multivitamin with minerals  1 tablet Oral Daily   nicotine  21 mg Transdermal Daily   pantoprazole  40 mg Oral QHS   sodium chloride flush  3 mL Intravenous Q12H   sodium chloride flush  3 mL Intravenous Q12H   Continuous Infusions:  sodium chloride Stopped (09/15/22 1040)   sodium chloride Stopped (09/16/22 1257)   sodium chloride     amiodarone 30 mg/hr (09/16/22 1032)   azithromycin 500 mg (09/16/22 1431)   cefTRIAXone (ROCEPHIN)  IV Stopped (09/15/22 2251)   PRN Meds: sodium chloride, acetaminophen, albuterol, diazepam, docusate sodium, hydrALAZINE, hydrALAZINE, labetalol, nitroGLYCERIN, ondansetron (ZOFRAN) IV, mouth rinse, polyethylene glycol, sodium chloride flush   Vital Signs    Vitals:   09/16/22 1330 09/16/22 1345 09/16/22 1400 09/16/22 1626  BP:      Pulse: 66 73 86   Resp: (!) 23 (!) 25 17   Temp:    97.6 F (36.4 C)  TempSrc:    Oral  SpO2: 94% 94% 93%   Weight:      Height:        Intake/Output Summary (Last 24 hours) at 09/16/2022 1709 Last data filed at 09/16/2022 0755 Gross per 24 hour  Intake 930.58 ml  Output 900 ml  Net 30.58 ml      09/16/2022    5:00 AM 09/14/2022    4:44 AM 09/13/2022    5:00 AM  Last 3 Weights  Weight (lbs) 166 lb 7.2 oz 154 lb 8.7 oz 156 lb 15.5 oz  Weight (kg) 75.5 kg 70.1 kg 71.2 kg      Telemetry    NSR - Personally Reviewed  ECG      Physical Exam   GEN: No acute distress.    Neck: No JVD Cardiac: RRR, no murmurs, rubs, or gallops.  Respiratory: wheezing to auscultation bilaterally. GI: Soft, nontender, non-distended  MS: No edema; No deformity. No right groin hematoma Neuro:  Nonfocal  Psych: Normal affect   Labs    High Sensitivity Troponin:   Recent Labs  Lab 09/13/22 0110 09/13/22 1055 09/15/22 0935 09/15/22 1150 09/16/22 0411  TROPONINIHS 2,510* 1,008* 1,982* 1,643* 2,175*     Chemistry Recent Labs  Lab 09/12/22 0656 09/12/22 0712 09/13/22 2008 09/13/22 2153 09/14/22 0348 09/15/22 0325 09/16/22 0411  NA 140   < >  --    < > 138 141 141  K 4.3   < >  --    < > 4.1 4.4 4.0  CL 99   < >  --    < > 105 107 105  CO2 31   < >  --    < > 24 27 31   GLUCOSE 258*   < >  --    < > 126* 112* 98  BUN 17   < >  --    < > 27*  30* 22  CREATININE 1.48*   < >  --    < > 0.94 0.94 0.98  CALCIUM 9.0   < >  --    < > 8.3* 8.4* 8.3*  MG  --    < > 1.0*  --  2.5* 2.5*  --   PROT 6.7  --   --   --   --   --   --   ALBUMIN 4.2  --   --   --   --   --   --   AST 22  --   --   --   --   --   --   ALT 20  --   --   --   --   --   --   ALKPHOS 65  --   --   --   --   --   --   BILITOT 0.4  --   --   --   --   --   --   GFRNONAA 50*   < >  --    < > >60 >60 >60  ANIONGAP 10   < >  --    < > 9 7 5    < > = values in this interval not displayed.    Lipids  Recent Labs  Lab 09/12/22 0656  CHOL 120  TRIG 96  HDL 56  LDLCALC 45  CHOLHDL 2.1    Hematology Recent Labs  Lab 09/14/22 0348 09/15/22 0325 09/16/22 0411  WBC 12.9* 13.7* 10.3  RBC 3.99* 4.27 4.03*  HGB 12.3* 13.4 12.4*  HCT 36.3* 40.1 37.6*  MCV 91.0 93.9 93.3  MCH 30.8 31.4 30.8  MCHC 33.9 33.4 33.0  RDW 14.6 14.9 14.5  PLT 166 168 156   Thyroid No results for input(s): "TSH", "FREET4" in the last 168 hours.  BNP Recent Labs  Lab 09/12/22 1016  BNP 63.0    DDimer No results for input(s): "DDIMER" in the last 168 hours.   Radiology    No results found.  Cardiac Studies    Cath films reviewed  Patient Profile     71 y.o. male elevated troponin  Assessment & Plan    Elevated troponin: No change in coronary anatomy from prior.  Demand ischemia.  Continue aggressive secondary prevention.  Restart [place] tomorrow.     For questions or updates, please contact [facility] Please consult [email or web address] for contact info under        Signed, 62, MD  09/16/2022, 5:09 PM

## 2022-09-16 NOTE — Progress Notes (Signed)
PT Cancellation Note  Patient Details Name: Dennis Preston MRN: [id number] DOB: 1950/10/24   Cancelled Treatment:    Reason Eval/Treat Not Completed: Patient not medically ready (pt remains with elevated troponin and cath pending)   Jake Fuhrmann B Sharmeka Palmisano 09/16/2022, 8:39 AM Merryl Hacker, PT Acute Rehabilitation Services Office: [phone]

## 2022-09-16 NOTE — Progress Notes (Signed)
Nutrition Follow-up  DOCUMENTATION CODES:  Not applicable  INTERVENTION:  Continue Regular diet once pt is out of procedure Ensure Enlive po BID, each supplement provides 350 kcal and 20 grams of protein. MVI with minerals daily  NUTRITION DIAGNOSIS:  Inadequate oral intake related to inability to eat as evidenced by NPO status. - remains applicable  GOAL:  Patient will meet greater than or equal to 90% of their needs - progressing, diet and supplements in place  MONITOR:  PO intake, Supplement acceptance, Diet advancement, Labs, Skin, I & O's  REASON FOR ASSESSMENT:  New TF, Ventilator    ASSESSMENT:  71 y.o. male presented to the ED with shortness of breath and decreased level of consciousness, requiring intubation on arrival to the ED. PT recently admitted to [facility] win October. PMH includes HTN, HLD, NSTEMI, COPD, A. Fib, CHF, s/p CABG x4, recent cessation of tobacco and EtOH. Pt admitted with respiratory failure due to wide complex tachycardia and O2 dependent COPD.    12/15 - intubated 12/17 - extubated 12/18 - diet advanced to regular  Pt out of room in cath lab at the time of assessment. Discussed in rounds, RN reports that pt will be returning to floor soon. Diet advanced yesterday and pt consuming 50% of his meals. Will add supplements and MVI to augment intake. Would recommend a regular diet once pt is back to the unit.     Intake/Output Summary (Last 24 hours) at 09/16/2022 1150 Last data filed at 09/16/2022 0600 Gross per 24 hour  Intake 1474.43 ml  Output 1950 ml  Net -475.57 ml  Net IO Since Admission: 3,124.21 mL [09/16/22 1150]  Average Meal Intake: 12/18: 50% intake x 2 recorded meals  Nutritionally Relevant Medications: Scheduled Meds:  atorvastatin  80 mg Oral Daily   furosemide  40 mg Oral Daily   insulin aspart  0-15 Units Subcutaneous Q4H   methylPREDNISolone  40 mg Intravenous Daily   pantoprazole  40 mg Oral QHS   Continuous Infusions:   azithromycin Stopped (09/15/22 1147)   cefTRIAXone (ROCEPHIN)  IV Stopped (09/15/22 2251)   PRN Meds: docusate sodium, polyethylene glycol  Labs Reviewed: Mg 2.5 CBG ranges from 76-118 mg/dL over the last 24 hours Hgb A1c 5.9% (02/17/22)   NUTRITION - FOCUSED PHYSICAL EXAM: Flowsheet Row Most Recent Value  Orbital Region Unable to assess  Upper Arm Region No depletion  Thoracic and Lumbar Region No depletion  Buccal Region Unable to assess  Temple Region Mild depletion  Clavicle Bone Region Mild depletion  Clavicle and Acromion Bone Region Mild depletion  Scapular Bone Region Mild depletion  Dorsal Hand Unable to assess  Patellar Region Mild depletion  Anterior Thigh Region Mild depletion  Posterior Calf Region Mild depletion  Edema (RD Assessment) None  Hair Reviewed  Eyes Reviewed  Mouth Unable to assess  Skin Reviewed  Nails Unable to assess   Diet Order:   Diet Order             Diet NPO time specified Except for: Sips with Meds  Diet effective midnight                  EDUCATION NEEDS:  No education needs have been identified at this time  Skin:  Skin Assessment: Reviewed RN Assessment  Last BM:  Unknown, prn bowel regimen in place  Height:  Ht Readings from Last 1 Encounters:  09/12/22 5\' 5"  (1.651 m)   Weight:  Wt Readings from Last 1 Encounters:  09/16/22 75.5 kg   Ideal Body Weight:  61.8 kg  BMI:  Body mass index is 27.7 kg/m.  Estimated Nutritional Needs:  Kcal:  1800-2000 kcal/d Protein:  90-105g/d Fluid:  1.8-2L/d    Greig Castilla, RD, LDN Clinical Dietitian RD pager # available in AMION  After hours/weekend pager # available in [facility]

## 2022-09-16 NOTE — Interval H&P Note (Signed)
Cath Lab Visit (complete for each Cath Lab visit)  Clinical Evaluation Leading to the Procedure:   ACS: Yes.    Non-ACS:    Anginal Classification: CCS III  Anti-ischemic medical therapy: No Therapy  Non-Invasive Test Results: No non-invasive testing performed  Prior CABG: Previous CABG      History and Physical Interval Note:  09/16/2022 9:15 AM  Dennis Preston  has presented today for surgery, with the diagnosis of nstemi.  The various methods of treatment have been discussed with the patient and family. After consideration of risks, benefits and other options for treatment, the patient has consented to  Procedure(s): LEFT HEART CATH AND CORONARY ANGIOGRAPHY (N/A) as a surgical intervention.  The patient's history has been reviewed, patient examined, no change in status, stable for surgery.  I have reviewed the patient's chart and labs.  Questions were answered to the patient's satisfaction.     Nicki Guadalajara

## 2022-09-16 NOTE — Progress Notes (Addendum)
NAME:  Dennis Preston, MRN:  [id number], DOB:  1950-12-04, LOS: 4 ADMISSION DATE:  09/12/2022, CONSULTATION DATE: 09/12/2022 REFERRING MD: Emergency department physician, CHIEF COMPLAINT: Hypoxic respiratory failure, coronary artery disease, wide-complex tachycardia  History of Present Illness:  Mr. Furse 71 year old male with extensive past medical history is well-documented below.  Of note he stopped smoking and drinking for the last several months.  He was admitted to medicine hospital in October for similar episode.  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  EMS was notified due to shortness of breath decreased level of consciousness while in the ambulance he required bagging to maintain adequate ventilation on arrival to [facility] emergency department he was intubated.  He did have a wide-complex tachycardia that is now resolved.  He had bedside evaluation by cardiology he may or may not go to Cath Lab.  We will admit to the intensive care unit at this time.  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, SVG-OM1, SVG-PDA, SVG-PL   Hyperlipemia    Hypertension    Leucocytosis    Liver dysfunction    Long term current use of clopidogrel    NSTEMI (non-ST elevated myocardial infarction) ([place])    approx 2016   Peripheral artery disease (HCC)    legs   Tobacco abuse    Wears dentures    full upper and lower     Significant Hospital Events: Including procedures, antibiotic start and stop dates in addition to other pertinent events   09/12/2022 intubated Emergency Department 09/14/2022  extubated  09/16/22 L heart catheterization and coronary angiography   Interim History / Subjective:  Feeling well this morning, Xanax improved anxiety. HR variable with episodes of bradycardia to the 40s.  Labile blood pressures with anxiety  Objective   Blood pressure (!) 127/51, pulse (!) 45, temperature 97.6 F (36.4 C), temperature source Oral, resp. rate 17, height 5\' 5"  (1.651 m), weight 75.5 kg, SpO2 98 %.       Intake/Output Summary (Last 24 hours) at 09/16/2022 0707 Last data filed at 09/16/2022 0600 Gross per 24 hour  Intake 1594.43 ml  Output 1950 ml  Net -355.57 ml    Filed Weights   09/13/22 0500 09/14/22 0444 09/16/22 0500  Weight: 71.2 kg 70.1 kg 75.5 kg    Examination: Physical exam: General: Chronically male, on 3L [place] HEENT: [place]/AT, eyes anicteric.   Neuro: Anxious,  following commands, moving all 4 extremities, no focal deficients  Chest: Coarse bilateral inspiratory and expiratory wheezing, no rhonchi or crackles Heart: Normal rate, no murmurs or gallops, 2+ b/l LE edema Abdomen: Soft, nondistended, bowel sounds present Skin: No rash  Resolved Hospital Problem list   Acute respiratory acidosis Hypophosphatemia/hypomagnesemia  Assessment & Plan:  Acute on chronic hypoxic/hypercapnic respiratory failure Acute COPD exacerbation Continue on oxygen to maintain saturations between 89-92% On 1.5-2L baseline mostly at night Continue Solu-Medrol day 5 Continue nebs Continue ceftriaxone and azithromycin  will complete 5 days today  Acute metabolic encephalopathy in the setting of hypercapnia Patient  mental status has improved, he is awake after clearance of hypercapnia  Acute HFpEF and right ventricular systolic heart failure Wide-complex tachycardia Acute NSTEMI Paroxysmal A-fib Hypertension Hyperlipidemia Coronary artery disease s/p CABG Echocardiogram was done which showed EF of 65% with grade 1 diastolic dysfunction Right ventricular function is  slightly depressed Patient continues to have intermittent wide-complex tachycardia once he is more awake  EKG was repeated consistent with left bundle branch block His troponins  remain elevated Continue lasix 40 mg daily Continue on amiodarone  Cath lab today, follow up cardiology recommendations His blood pressure is labile, if improved after cath can remove A-line possible transfer out of ICU  Acute kidney injury- resolved Stable on lasix   Best Practice (right click and "Reselect all SmartList Selections" daily)  Diet/type: Cleared bedside swallow, restart regular diet if not having procedure today DVT prophylaxis: systemic heparin GI prophylaxis: PPI Lines: N/A Foley:  N/A Code Status:  full code Last date of multidisciplinary goals of care discussion  09/12/2022 0800 hrs. wife and son updated at bedside.  Code discussion started at this time they want full code.    Quincy Simmonds, MD 09/16/22 Danise Edge,  PGY-3  Pager [phone]

## 2022-09-17 ENCOUNTER — Encounter (HOSPITAL_COMMUNITY): Payer: Self-pay | Admitting: Cardiovascular Disease

## 2022-09-17 DIAGNOSIS — J9601 Acute respiratory failure with hypoxia: Secondary | ICD-10-CM | POA: Diagnosis not present

## 2022-09-17 LAB — CBC
HCT: 38 % — ABNORMAL LOW (ref 39.0–52.0)
Hemoglobin: 13.2 g/dL (ref 13.0–17.0)
MCH: 31.7 pg (ref 26.0–34.0)
MCHC: 34.7 g/dL (ref 30.0–36.0)
MCV: 91.3 fL (ref 80.0–100.0)
Platelets: 159 10*3/uL (ref 150–400)
RBC: 4.16 MIL/uL — ABNORMAL LOW (ref 4.22–5.81)
RDW: 14 % (ref 11.5–15.5)
WBC: 7.8 10*3/uL (ref 4.0–10.5)
nRBC: 0 % (ref 0.0–0.2)

## 2022-09-17 LAB — BASIC METABOLIC PANEL
Anion gap: 7 (ref 5–15)
BUN: 18 mg/dL (ref 8–23)
CO2: 32 mmol/L (ref 22–32)
Calcium: 8.8 mg/dL — ABNORMAL LOW (ref 8.9–10.3)
Chloride: 102 mmol/L (ref 98–111)
Creatinine, Ser: 0.86 mg/dL (ref 0.61–1.24)
GFR, Estimated: >60 mL/min (ref >60)
Glucose, Bld: 143 mg/dL — ABNORMAL HIGH (ref 70–99)
Potassium: 4.3 mmol/L (ref 3.5–5.1)
Sodium: 141 mmol/L (ref 135–145)

## 2022-09-17 LAB — CULTURE, BLOOD (ROUTINE X 2)
Culture: NO GROWTH
Culture: NO GROWTH

## 2022-09-17 LAB — GLUCOSE, CAPILLARY
Glucose-Capillary: 129 mg/dL — ABNORMAL HIGH (ref 70–99)
Glucose-Capillary: 151 mg/dL — ABNORMAL HIGH (ref 70–99)
Glucose-Capillary: 89 mg/dL (ref 70–99)
Glucose-Capillary: 185 mg/dL — ABNORMAL HIGH (ref 70–99)
Glucose-Capillary: 139 mg/dL — ABNORMAL HIGH (ref 70–99)

## 2022-09-17 LAB — APTT: aPTT: 27 seconds (ref 24–36)

## 2022-09-17 LAB — HEPARIN LEVEL (UNFRACTIONATED): Heparin Unfractionated: <0.1 IU/mL — ABNORMAL LOW (ref 0.30–0.70)

## 2022-09-17 MED ORDER — FLUTICASONE FUROATE-VILANTEROL 100-25 MCG/ACT IN AEPB
1.0000 | INHALATION_SPRAY | Freq: Every day | RESPIRATORY_TRACT | Status: DC
  Administered 2022-09-18 – 2022-09-20 (×3): 1 via RESPIRATORY_TRACT
  Filled 2022-09-17: qty 28

## 2022-09-17 MED ORDER — INSULIN ASPART 100 UNIT/ML IJ SOLN
0.0000 [IU] | Freq: Three times a day (TID) | INTRAMUSCULAR | Status: DC
  Administered 2022-09-18: 3 [IU] via SUBCUTANEOUS
  Administered 2022-09-18: 8 [IU] via SUBCUTANEOUS
  Administered 2022-09-19: 2 [IU] via SUBCUTANEOUS
  Administered 2022-09-19 (×2): 5 [IU] via SUBCUTANEOUS
  Administered 2022-09-20: 3 [IU] via SUBCUTANEOUS

## 2022-09-17 MED ORDER — FUROSEMIDE 40 MG PO TABS
40.0000 mg | ORAL_TABLET | Freq: Two times a day (BID) | ORAL | Status: DC
  Administered 2022-09-17: 40 mg via ORAL
  Filled 2022-09-17: qty 1

## 2022-09-17 MED ORDER — AMIODARONE HCL 200 MG PO TABS
400.0000 mg | ORAL_TABLET | Freq: Every day | ORAL | Status: DC
  Administered 2022-09-17 – 2022-09-19 (×3): 400 mg via ORAL
  Filled 2022-09-17 (×3): qty 2

## 2022-09-17 MED ORDER — IPRATROPIUM-ALBUTEROL 0.5-2.5 (3) MG/3ML IN SOLN
3.0000 mL | Freq: Three times a day (TID) | RESPIRATORY_TRACT | Status: DC
  Administered 2022-09-17 – 2022-09-18 (×2): 3 mL via RESPIRATORY_TRACT
  Filled 2022-09-17 (×2): qty 3

## 2022-09-17 MED FILL — Verapamil HCl IV Soln 2.5 MG/ML: INTRAVENOUS | Qty: 2 | Status: AC

## 2022-09-17 NOTE — Evaluation (Signed)
Physical Therapy Evaluation Patient Details Name: Dennis Preston MRN: [id number] DOB: Oct 24, 1950 Today's Date: 09/17/2022  History of Present Illness  71 yo male admitted 12/15 with SOB and LOC. Intubated 12/15-12/17. PMHx: COPD, AFib, PAD, NSTEMI, CABG, CHF, HTN, HLD, RT CEA, ETOH abuse  Clinical Impression  Pt admitted with above diagnosis. Pt was able to take pivotal steps to chair with pt having incr DOE to 3/4 with pivot only. Pt on 4LO2 at present.  Pt with decr endurance for activity. Pt feels that he will progress and be able to go home with wifes assist. Will follow acutely.  Pt currently with functional limitations due to the deficits listed below (see PT Problem List). Pt will benefit from skilled PT to increase their independence and safety with mobility to allow discharge to the venue listed below.          Recommendations for follow up therapy are one component of a multi-disciplinary discharge planning process, led by the attending physician.  Recommendations may be updated based on patient status, additional functional criteria and insurance authorization.  Follow Up Recommendations Home health PT      Assistance Recommended at Discharge Intermittent Supervision/Assistance  Patient can return home with the following  A little help with walking and/or transfers;Assistance with cooking/housework;Assist for transportation;Help with stairs or ramp for entrance    Equipment Recommendations  (shower chair)  Recommendations for Other Services       Functional Status Assessment Patient has had a recent decline in their functional status and demonstrates the ability to make significant improvements in function in a reasonable and predictable amount of time.     Precautions / Restrictions Precautions Precautions: Fall Restrictions Weight Bearing Restrictions: No      Mobility  Bed Mobility Overal bed mobility: Needs Assistance Bed Mobility: Supine to Sit     Supine  to sit: Min guard     General bed mobility comments: incr time but pt to EOB with little assist.    Transfers Overall transfer level: Needs assistance Equipment used: None Transfers: Sit to/from Stand, Bed to chair/wheelchair/BSC Sit to Stand: Min assist   Step pivot transfers: Min assist       General transfer comment: Pt needing min assist to power up and steady and then was able to take pivotal steps to chair with min assist.  DOE 3/4 with transfer with pt on 4LO2.    Ambulation/Gait                  Stairs            Wheelchair Mobility    Modified Rankin (Stroke Patients Only)       Balance                                             Pertinent Vitals/Pain Pain Assessment Pain Assessment: No/denies pain    Home Living Family/patient expects to be discharged to:: Private residence Living Arrangements: Spouse/significant other Available Help at Discharge: Family;Available 24 hours/day Type of Home: Mobile home Home Access: Stairs to enter Entrance Stairs-Rails: Right;Left Entrance Stairs-Number of Steps: 4   Home Layout: One level Home Equipment: Agricultural consultant (2 wheels);Cane - single point;Wheelchair - power;Electric scooter (1.5 L O2 at night) Additional Comments: sleeps in a recliner    Prior Function Prior Level of Function : Independent/Modified Independent  Mobility Comments: doesnt use AD, uses power wheelchair outdoors and was Indpendent with mobility PTA ADLs Comments: B/D self     Hand Dominance   Dominant Hand: Right    Extremity/Trunk Assessment   Upper Extremity Assessment Upper Extremity Assessment: Defer to OT evaluation    Lower Extremity Assessment Lower Extremity Assessment: Generalized weakness    Cervical / Trunk Assessment Cervical / Trunk Assessment: Normal  Communication   Communication: No difficulties  Cognition Arousal/Alertness: Awake/alert Behavior During Therapy:  Flat affect Overall Cognitive Status: Within Functional Limits for tasks assessed                                          General Comments General comments (skin integrity, edema, etc.): 70 bpm, 96% 4LO2, 146/46    Exercises     Assessment/Plan    PT Assessment Patient needs continued PT services  PT Problem List Decreased activity tolerance;Decreased balance;Decreased mobility;Decreased knowledge of use of DME;Decreased safety awareness;Decreased knowledge of precautions;Cardiopulmonary status limiting activity       PT Treatment Interventions Gait training;DME instruction;Functional mobility training;Therapeutic activities;Therapeutic exercise;Balance training;Patient/family education;Stair training    PT Goals (Current goals can be found in the Care Plan section)  Acute Rehab PT Goals Patient Stated Goal: to go home PT Goal Formulation: With patient Time For Goal Achievement: 10/01/22 Potential to Achieve Goals: Fair    Frequency Min 3X/week     Co-evaluation               AM-PAC PT "6 Clicks" Mobility  Outcome Measure Help needed turning from your back to your side while in a flat bed without using bedrails?: A Little Help needed moving from lying on your back to sitting on the side of a flat bed without using bedrails?: A Little Help needed moving to and from a bed to a chair (including a wheelchair)?: A Little Help needed standing up from a chair using your arms (e.g., wheelchair or bedside chair)?: A Little Help needed to walk in hospital room?: A Lot Help needed climbing 3-5 steps with a railing? : A Lot 6 Click Score: 16    End of Session Equipment Utilized During Treatment: Gait belt;Oxygen Activity Tolerance: Patient limited by fatigue Patient left: in chair;with call bell/phone within reach Nurse Communication: Mobility status PT Visit Diagnosis: Unsteadiness on feet (R26.81);Muscle weakness (generalized) (M62.81)    Time:  [id number] PT Time Calculation (min) (ACUTE ONLY): 26 min   Charges:   PT Evaluation $PT Eval Moderate Complexity: 1 Mod PT Treatments $Therapeutic Activity: 8-22 mins        [facility] M,PT Acute Rehab Services [phone]   Alvira Philips 09/17/2022, 12:45 PM

## 2022-09-17 NOTE — Progress Notes (Addendum)
[facility] PROGRESS NOTE  Dennis Preston (DOB: 1950/10/04) [id number] PCP: Barbette Reichmann, MD  Brief Narrative: Dennis Preston is a 71 y.o. male with a history of COPD, HFpEF, PAF, CAD s/p 4v CABG 2016, HTN, HLD, PAD, tobacco use who presented to the ED on 09/12/2022. He has been at [facility] for subclavian steal with stent placement. EMS was notified due to shortness of breath decreased level of consciousness while in the ambulance he required bagging to maintain adequate ventilation on arrival to [facility] emergency department he was intubated. He did have a wide-complex tachycardia that resolved. He was treated with antibiotics, IV steroids, nebulizer therapies. Ultimately, he was extubated 12/17. LHC 12/19 showed stable coronary disease for which continued medical therapy is recommended.   Subjective: Breathing is improved significantly from when he came in. Has been on 1.5-2L O2 qHS for years, not usually requiring it during the day but did after a bout of pneumonia.   Objective: BP (!) 142/104   Pulse 60   Temp 97.7 F (36.5 C) (Oral)   Resp (!) 24   Ht 5\' 5"  (1.651 m)   Wt 73.1 kg   SpO2 96%   BMI 26.82 kg/m   Gen: Chronically ill-appearing, WDWN male in no distress sitting in chair Pulm: Diminished, slight end expiratory wheezing, no crackles, tachypneic with supplemental oxygen  CV: RRR with frequent PACs, +JVD, slight LE edema GI: Soft, NT, ND, +BS Neuro: Alert and oriented. No new focal deficits. Ext: Warm, no deformities Skin: No rashes, lesions or ulcers on visualized skin   Assessment & Plan: Acute on chronic hypoxic respiratory failure: Due to COPD exacerbation, probable pneumonia (CXR opacity, leukocytosis) as well as acute HFpEF.  - s/p IV lasix per cardiology. LVEDP at cath, augment lasix po to BID dosing unless otherwise recommended by cardiology. - Restart breo and scheduled duoneb for trelegy, continue prn  albuterol - Completed antibiotics - Still modest wheezing, will continue IV steroid for now.  - Needs IS, FV, mobilizing, PT/OT  Type II NSTEMI, CAD s/p CABG:  - Continue plavix, anticoagulation, statin. Became bradycardic with beta blocker, so not on this  SVT: Per cardiology.  - Continue amiodarone.    Acute on chronic HFpEF: Echo showed LVEF 65%, G1DD, RVSF mildly down.   Prediabetes: HbA1c 6.2%.  - PCP follow up - Change CBG/mod SSI to AC/HS  PAF:  - Continue eliquis - Not on rate control agent at this time.   AKI: Resolved.  - Monitor in AM.  HTN:  - prn hydralazine  HLD:  - Continue high intensity statin. LDL is at goal at 45.  PAD, s/p left subclavian artery stent 2023:  - Continue plavix, statin  Tobacco use:  - Nicotine patch  GERD:  - PPI  2024, MD [facility] [email or web address] 09/17/2022, 11:48 AM

## 2022-09-18 ENCOUNTER — Inpatient Hospital Stay (HOSPITAL_COMMUNITY): Payer: Medicare Other

## 2022-09-18 DIAGNOSIS — J9601 Acute respiratory failure with hypoxia: Secondary | ICD-10-CM | POA: Diagnosis not present

## 2022-09-18 LAB — BASIC METABOLIC PANEL
Anion gap: 6 (ref 5–15)
BUN: 16 mg/dL (ref 8–23)
CO2: 34 mmol/L — ABNORMAL HIGH (ref 22–32)
Calcium: 8.4 mg/dL — ABNORMAL LOW (ref 8.9–10.3)
Chloride: 99 mmol/L (ref 98–111)
Creatinine, Ser: 0.84 mg/dL (ref 0.61–1.24)
GFR, Estimated: >60 mL/min (ref >60)
Glucose, Bld: 131 mg/dL — ABNORMAL HIGH (ref 70–99)
Potassium: 4 mmol/L (ref 3.5–5.1)
Sodium: 139 mmol/L (ref 135–145)

## 2022-09-18 LAB — GLUCOSE, CAPILLARY
Glucose-Capillary: 103 mg/dL — ABNORMAL HIGH (ref 70–99)
Glucose-Capillary: 155 mg/dL — ABNORMAL HIGH (ref 70–99)
Glucose-Capillary: 265 mg/dL — ABNORMAL HIGH (ref 70–99)
Glucose-Capillary: 248 mg/dL — ABNORMAL HIGH (ref 70–99)

## 2022-09-18 LAB — CBC
HCT: 37.5 % — ABNORMAL LOW (ref 39.0–52.0)
Hemoglobin: 12.9 g/dL — ABNORMAL LOW (ref 13.0–17.0)
MCH: 31.2 pg (ref 26.0–34.0)
MCHC: 34.4 g/dL (ref 30.0–36.0)
MCV: 90.8 fL (ref 80.0–100.0)
Platelets: 167 10*3/uL (ref 150–400)
RBC: 4.13 MIL/uL — ABNORMAL LOW (ref 4.22–5.81)
RDW: 13.9 % (ref 11.5–15.5)
WBC: 9 10*3/uL (ref 4.0–10.5)
nRBC: 0 % (ref 0.0–0.2)

## 2022-09-18 LAB — LIPOPROTEIN A (LPA): Lipoprotein (a): 12.7 nmol/L (ref <75.0)

## 2022-09-18 LAB — BRAIN NATRIURETIC PEPTIDE: B Natriuretic Peptide: 110.1 pg/mL — ABNORMAL HIGH (ref 0.0–100.0)

## 2022-09-18 MED ORDER — PREDNISONE 20 MG PO TABS
40.0000 mg | ORAL_TABLET | Freq: Every day | ORAL | Status: DC
  Administered 2022-09-19: 40 mg via ORAL

## 2022-09-18 MED ORDER — IPRATROPIUM-ALBUTEROL 0.5-2.5 (3) MG/3ML IN SOLN
3.0000 mL | Freq: Two times a day (BID) | RESPIRATORY_TRACT | Status: DC
  Administered 2022-09-18 – 2022-09-20 (×4): 3 mL via RESPIRATORY_TRACT
  Filled 2022-09-18 (×4): qty 3

## 2022-09-18 MED ORDER — FUROSEMIDE 10 MG/ML IJ SOLN
40.0000 mg | Freq: Two times a day (BID) | INTRAMUSCULAR | Status: DC
  Administered 2022-09-18 – 2022-09-20 (×5): 40 mg via INTRAVENOUS
  Filled 2022-09-18 (×5): qty 4

## 2022-09-18 MED ORDER — IPRATROPIUM-ALBUTEROL 0.5-2.5 (3) MG/3ML IN SOLN: 3.0000 mL | Freq: Two times a day (BID) | RESPIRATORY_TRACT | Status: DC

## 2022-09-18 NOTE — Progress Notes (Signed)
[facility] PROGRESS NOTE  ARAVIND CHRISMER (DOB: 1951-09-14) [id number] PCP: Barbette Reichmann, MD  Brief Narrative: Dennis Preston is a 71 y.o. male with a history of COPD, HFpEF, PAF, CAD s/p 4v CABG 2016, HTN, HLD, PAD, tobacco use who presented to the ED on 09/12/2022. He has been at [facility] for subclavian steal with stent placement. EMS was notified due to shortness of breath decreased level of consciousness while in the ambulance he required bagging to maintain adequate ventilation on arrival to [facility] emergency department he was intubated. He did have a wide-complex tachycardia that resolved. He was treated with antibiotics, IV steroids, nebulizer therapies. Ultimately, he was extubated 12/17. LHC 12/19 showed stable coronary disease for which continued medical therapy is recommended.   Subjective: Continues slow improvement in breathing, wheezing improved significantly since restarting scheduled duonebs. Has nebs at home. Has O2 at home. No chest pain. Worked with PT with no significant desaturations but severely limited by dyspnea. Wife with whom he lives is staying home today due to viral illness.  Objective: BP (!) 140/54 (BP Location: Left Arm)   Pulse 60   Temp 97.6 F (36.4 C) (Oral)   Resp (!) 21   Ht 5\' 5"  (1.651 m)   Wt 76.3 kg   SpO2 93%   BMI 27.99 kg/m   Gen: No acute distress Pulm: Diminished without crackles or wheezes  CV: Irreg bradycardia without MRG. Trace pitting dependent edema. GI: Soft, NT, ND, +BS  Neuro: Alert and oriented. No new focal deficits. Ext: Warm, no deformities Skin: No new rashes, lesions or ulcers on visualized skin   Assessment & Plan: Acute on chronic hypoxic respiratory failure: Due to COPD exacerbation, probable pneumonia (CXR opacity, leukocytosis) as well as acute HFpEF.  - CXR this AM with significant improvement in LLL airspace opacity, no significant effusions, slight interstitial  prominence on my personal review. - s/p IV lasix per cardiology. LVEDP at cath, will give 2 doses of IV lasix in lieu of home lasix 40mg  po BID. Was given IVF initially, is up 67kg >> 76kg today. Was 73.7kg at recent discharge. BNP elevated.  - Restarted breo and scheduled duoneb for trelegy, continue prn albuterol - Completed antibiotics - Wheezing improved, transition to prednisone 12/22.  - Needs IS, FV (reordered), mobilizing, PT/OT  Type II NSTEMI, CAD s/p CABG:  - Continue plavix, anticoagulation, statin. No BB due to bradycardia.  SVT: Per cardiology.  - Continue amiodarone.    Acute on chronic HFpEF: Echo showed LVEF 65%, G1DD, RVSF mildly down.   Prediabetes: HbA1c 6.2%.  - PCP follow up - Change CBG/mod SSI to AC/HS  PAF:  - Currently with intermittently slow ventricular response, had bradycardia with beta blocker in the past, so avoiding this.  - Continue eliquis - Not on rate control agent at this time.   AKI: Resolved.  - Monitor in AM.  HTN:  - prn hydralazine  HLD:  - Continue high intensity statin. LDL is at goal at 45.  PAD, s/p left subclavian artery stent 2023:  - Continue plavix, statin  Tobacco use:  - Nicotine patch  GERD:  - PPI  1/23, MD [facility] [email or web address] 09/18/2022, 10:16 AM

## 2022-09-18 NOTE — Care Management Important Message (Signed)
Important Message  Patient Details  Name: Dennis Preston MRN: [id number] Date of Birth: 11-Jan-1951   Medicare Important Message Given:  Yes     Ester Mabe 09/18/2022, 11:22 AM

## 2022-09-19 DIAGNOSIS — Z720 Tobacco use: Secondary | ICD-10-CM | POA: Diagnosis not present

## 2022-09-19 DIAGNOSIS — I739 Peripheral vascular disease, unspecified: Secondary | ICD-10-CM | POA: Diagnosis not present

## 2022-09-19 DIAGNOSIS — I25118 Atherosclerotic heart disease of native coronary artery with other forms of angina pectoris: Secondary | ICD-10-CM

## 2022-09-19 DIAGNOSIS — I480 Paroxysmal atrial fibrillation: Secondary | ICD-10-CM | POA: Diagnosis not present

## 2022-09-19 DIAGNOSIS — J9601 Acute respiratory failure with hypoxia: Secondary | ICD-10-CM | POA: Diagnosis not present

## 2022-09-19 LAB — BASIC METABOLIC PANEL
Anion gap: 9 (ref 5–15)
BUN: 16 mg/dL (ref 8–23)
CO2: 36 mmol/L — ABNORMAL HIGH (ref 22–32)
Calcium: 9.2 mg/dL (ref 8.9–10.3)
Chloride: 96 mmol/L — ABNORMAL LOW (ref 98–111)
Creatinine, Ser: 0.91 mg/dL (ref 0.61–1.24)
GFR, Estimated: >60 mL/min (ref >60)
Glucose, Bld: 159 mg/dL — ABNORMAL HIGH (ref 70–99)
Potassium: 4.2 mmol/L (ref 3.5–5.1)
Sodium: 141 mmol/L (ref 135–145)

## 2022-09-19 LAB — GLUCOSE, CAPILLARY
Glucose-Capillary: 147 mg/dL — ABNORMAL HIGH (ref 70–99)
Glucose-Capillary: 224 mg/dL — ABNORMAL HIGH (ref 70–99)
Glucose-Capillary: 216 mg/dL — ABNORMAL HIGH (ref 70–99)
Glucose-Capillary: 233 mg/dL — ABNORMAL HIGH (ref 70–99)

## 2022-09-19 MED ORDER — METHYLPREDNISOLONE SODIUM SUCC 125 MG IJ SOLR
40.0000 mg | Freq: Two times a day (BID) | INTRAMUSCULAR | Status: DC
  Administered 2022-09-19 – 2022-09-20 (×3): 40 mg via INTRAVENOUS
  Filled 2022-09-19 (×3): qty 2

## 2022-09-19 MED ORDER — AMIODARONE HCL 200 MG PO TABS
200.0000 mg | ORAL_TABLET | Freq: Every day | ORAL | Status: DC
  Administered 2022-09-20: 200 mg via ORAL
  Filled 2022-09-19: qty 1

## 2022-09-19 NOTE — Progress Notes (Signed)
Physical Therapy Treatment Patient Details Name: Dennis Preston MRN: [id number] DOB: May 24, 1951 Today's Date: 09/19/2022   History of Present Illness 71 yo male admitted 12/15 with SOB and LOC. Intubated 12/15-12/17. PMHx: COPD, AFib, PAD, NSTEMI, CABG, CHF, HTN, HLD, RT CEA, ETOH abuse    PT Comments    Pt tolerates treatment well, progressing to ambulation for household distances. Pt reports no significant DOE when mobilizing on 3L [place], sats not reading reliably when ambulating due to grip on walker. Pt sats return to 90+% within one minute after release of walker. Pt will continue to benefit from aggressive mobilization in an effort to improve activity tolerance and restore independence.   Recommendations for follow up therapy are one component of a multi-disciplinary discharge planning process, led by the attending physician.  Recommendations may be updated based on patient status, additional functional criteria and insurance authorization.  Follow Up Recommendations  Home health PT     Assistance Recommended at Discharge Intermittent Supervision/Assistance  Patient can return home with the following A little help with walking and/or transfers;Assistance with cooking/housework;Assist for transportation;Help with stairs or ramp for entrance   Equipment Recommendations  Other (comment) (shower chair)    Recommendations for Other Services       Precautions / Restrictions Precautions Precautions: Fall Restrictions Weight Bearing Restrictions: No     Mobility  Bed Mobility Overal bed mobility: Needs Assistance Bed Mobility: Supine to Sit, Sit to Supine     Supine to sit: Supervision, HOB elevated Sit to supine: Supervision        Transfers Overall transfer level: Needs assistance Equipment used: Rolling walker (2 wheels) Transfers: Sit to/from Stand Sit to Stand: Supervision           General transfer comment: verbal cues for hand placement     Ambulation/Gait Ambulation/Gait assistance: Supervision Gait Distance (Feet): 150 Feet Assistive device: Rolling walker (2 wheels) Gait Pattern/deviations: Step-through pattern Gait velocity: reduced Gait velocity interpretation: 1.31 - 2.62 ft/sec, indicative of limited community ambulator   General Gait Details: slowed step-through gait   Stairs             Wheelchair Mobility    Modified Rankin (Stroke Patients Only)       Balance Overall balance assessment: Needs assistance Sitting-balance support: No upper extremity supported, Feet supported Sitting balance-Leahy Scale: Good     Standing balance support: Single extremity supported, Reliant on assistive device for balance Standing balance-Leahy Scale: Poor                              Cognition Arousal/Alertness: Awake/alert Behavior During Therapy: WFL for tasks assessed/performed Overall Cognitive Status: Within Functional Limits for tasks assessed                                          Exercises      General Comments General comments (skin integrity, edema, etc.): pt on 3L [place] when mobilizing, reports increased work of breathing but no significant SOB, able to still converse while ambulating. SpO2 with innacurate reading due to grip of walker. Within 1 minute of sitting and releasing grip of walker sats back into low 90s      Pertinent Vitals/Pain Pain Assessment Pain Assessment: No/denies pain    Home Living  Prior Function            PT Goals (current goals can now be found in the care plan section) Acute Rehab PT Goals Patient Stated Goal: to go home Progress towards PT goals: Progressing toward goals    Frequency    Min 3X/week      PT Plan Current plan remains appropriate    Co-evaluation              AM-PAC PT "6 Clicks" Mobility   Outcome Measure  Help needed turning from your back to your side while  in a flat bed without using bedrails?: A Little Help needed moving from lying on your back to sitting on the side of a flat bed without using bedrails?: A Little Help needed moving to and from a bed to a chair (including a wheelchair)?: A Little Help needed standing up from a chair using your arms (e.g., wheelchair or bedside chair)?: A Little Help needed to walk in hospital room?: A Little Help needed climbing 3-5 steps with a railing? : A Little 6 Click Score: 18    End of Session Equipment Utilized During Treatment: Oxygen Activity Tolerance: Patient tolerated treatment well Patient left: in bed;with call bell/phone within reach;with bed alarm set Nurse Communication: Mobility status PT Visit Diagnosis: Unsteadiness on feet (R26.81);Muscle weakness (generalized) (M62.81)     Time: 1700-1717 PT Time Calculation (min) (ACUTE ONLY): 17 min  Charges:  $Gait Training: 8-22 mins                     Zenaida Niece, PT, DPT Acute Rehabilitation Office [place] 09/19/2022, 5:32 PM

## 2022-09-19 NOTE — Progress Notes (Signed)
[facility] PROGRESS NOTE  Dennis Preston (DOB: [birth year]) [id number] PCP: Barbette Reichmann, MD  Brief Narrative: Dennis Preston is a 71 y.o. male with a history of COPD, HFpEF, PAF, CAD s/p 4v CABG 2016, HTN, HLD, PAD, tobacco use who presented to the ED on 09/12/2022. He has been at [facility] for subclavian steal with stent placement. EMS was notified due to shortness of breath decreased level of consciousness while in the ambulance he required bagging to maintain adequate ventilation on arrival to [facility] emergency department he was intubated. He did have a wide-complex tachycardia that resolved. He was treated with antibiotics, IV steroids, nebulizer therapies. Ultimately, he was extubated 12/17. LHC 12/19 showed stable coronary disease for which continued medical therapy is recommended.   Subjective: Coughing still with mild hemoptysis that is stable, dyspnea much improved at rest, but still worse than baseline with exertion. +wheezing improved with breathing treatments. No chest pain. No other bleeding. No fever.  Objective: BP 105/88 (BP Location: Left Arm)   Pulse 67   Temp 97.8 F (36.6 C) (Oral)   Resp (!) 24   Ht 5\' 5"  (1.651 m)   Wt 75.8 kg   SpO2 93%   BMI 27.81 kg/m   Gen: No distress getting dentures in this morning Pulm: End-expiratory wheezing and crackles noted, nonlabored  CV: Regular borderline bradycardia, stable systolic murmur, trace pitting edema GI: Soft, NT, ND, +BS  Neuro: Alert and oriented. No new focal deficits. Ext: Warm, no deformities Skin: No rashes, lesions or ulcers on visualized skin   Assessment & Plan: Acute on chronic hypoxic respiratory failure: Due to COPD exacerbation, probable pneumonia (CXR opacity, leukocytosis) as well as acute HFpEF.  - Wheezing continues, will readjust steroids to solumedrol, continue scheduled and prn nebs.  - Suspect overload with IVF resuscitation initially during  admission. Continue lasix 40mg  IV BID. Had 3.6L UOP yesterday with weight down but still not at what was considered previous EDW. Cr stable. LVEDP at cath. 67kg >> 75kg today. Was 73.7kg at recent discharge. BNP elevated.  - Restarted breo and scheduled duoneb for trelegy, continue prn albuterol. Pt may request referral to [place] for different longitudinal provider.  - Completed antibiotics  - Needs IS, FV, mobilizing, PT/OT  Type II NSTEMI, CAD s/p CABG:  - Continue plavix, anticoagulation, statin. No BB due to bradycardia.  SVT: Per cardiology.  - Continue amiodarone.    Acute on chronic HFpEF: Echo showed LVEF 65%, G1DD, RVSF mildly down.   Prediabetes: HbA1c 6.2%.  - PCP follow up - Change CBG/mod SSI to AC/HS  PAF:  - Currently with intermittently slow ventricular response, had bradycardia with beta blocker in the past, so avoiding this.  - Continue eliquis - Not on rate control agent at this time.   AKI: Resolved.  - Monitor in AM.  HTN:  - prn hydralazine  HLD:  - Continue high intensity statin. LDL is at goal at 45.  PAD, s/p left subclavian artery stent 2023:  - Continue plavix, statin  Tobacco use:  - Nicotine patch  GERD:  - PPI  , MD [facility] [email or web address] 09/19/2022, 8:35 AM

## 2022-09-19 NOTE — Progress Notes (Addendum)
Rounding Note    Patient Name: Dennis Preston Date of Encounter: 09/19/2022  [facility] Cardiologist: None Dr. Juliann Pares at [facility]  Subjective   Feels better.  No palpitations.  No chest discomfort  Inpatient Medications    Scheduled Meds:  [START ON 09/20/2022] amiodarone  200 mg Oral Daily   apixaban  5 mg Oral BID   atorvastatin  80 mg Oral Daily   clopidogrel  75 mg Oral Daily   feeding supplement  237 mL Oral BID BM   fluticasone furoate-vilanterol  1 puff Inhalation Daily   furosemide  40 mg Intravenous BID   insulin aspart  0-15 Units Subcutaneous TID WC   ipratropium-albuterol  3 mL Nebulization BID   methylPREDNISolone (SOLU-MEDROL) injection  40 mg Intravenous BID   multivitamin with minerals  1 tablet Oral Daily   nicotine  21 mg Transdermal Daily   pantoprazole  40 mg Oral QHS   sodium chloride flush  3 mL Intravenous Q12H   Continuous Infusions:  sodium chloride Stopped (09/15/22 1040)   sodium chloride     PRN Meds: sodium chloride, acetaminophen, albuterol, diazepam, docusate sodium, hydrALAZINE, nitroGLYCERIN, ondansetron (ZOFRAN) IV, mouth rinse, polyethylene glycol, sodium chloride flush   Vital Signs    Vitals:   09/19/22 0750 09/19/22 0808 09/19/22 0810 09/19/22 1130  BP: 105/88   (!) 118/57  Pulse: 67   75  Resp: (!) 24   20  Temp: 97.8 F (36.6 C)   97.8 F (36.6 C)  TempSrc: Oral   Oral  SpO2: 94% 93% 93% 94%  Weight:      Height:        Intake/Output Summary (Last 24 hours) at 09/19/2022 1205 Last data filed at 09/19/2022 1130 Gross per 24 hour  Intake 640 ml  Output 3400 ml  Net -2760 ml      09/19/2022    5:00 AM 09/18/2022    2:51 AM 09/17/2022    5:00 AM  Last 3 Weights  Weight (lbs) 167 lb 1.7 oz 168 lb 3.4 oz 161 lb 2.5 oz  Weight (kg) 75.8 kg 76.3 kg 73.1 kg      Telemetry    Normal sinus rhythm- Personally Reviewed  ECG      Physical Exam   GEN: No acute distress.   Neck: No JVD Cardiac:  RRR, no murmurs, rubs, or gallops.  Respiratory: Clear to auscultation bilaterally. GI: Soft, nontender, non-distended  MS: No edema; No deformity. Neuro:  Nonfocal  Psych: Normal affect   Labs    High Sensitivity Troponin:   Recent Labs  Lab 09/13/22 0110 09/13/22 1055 09/15/22 0935 09/15/22 1150 09/16/22 0411  TROPONINIHS 2,510* 1,008* 1,982* 1,643* 2,175*     Chemistry Recent Labs  Lab 09/13/22 2008 09/13/22 2153 09/14/22 0348 09/15/22 0325 09/16/22 0411 09/17/22 0621 09/18/22 0506 09/19/22 0351  NA  --    < > 138 141   < > 141 139 141  K  --    < > 4.1 4.4   < > 4.3 4.0 4.2  CL  --    < > 105 107   < > 102 99 96*  CO2  --    < > 24 27   < > 32 34* 36*  GLUCOSE  --    < > 126* 112*   < > 143* 131* 159*  BUN  --    < > 27* 30*   < > 18 16 16   CREATININE  --    < >  0.94 0.94   < > 0.86 0.84 0.91  CALCIUM  --    < > 8.3* 8.4*   < > 8.8* 8.4* 9.2  MG 1.0*  --  2.5* 2.5*  --   --   --   --   GFRNONAA  --    < > >60 >60   < > >60 >60 >60  ANIONGAP  --    < > 9 7   < > 7 6 9    < > = values in this interval not displayed.    Lipids No results for input(s): "CHOL", "TRIG", "HDL", "LABVLDL", "LDLCALC", "CHOLHDL" in the last 168 hours.  Hematology Recent Labs  Lab 09/16/22 0411 09/17/22 0621 09/18/22 0506  WBC 10.3 7.8 9.0  RBC 4.03* 4.16* 4.13*  HGB 12.4* 13.2 12.9*  HCT 37.6* 38.0* 37.5*  MCV 93.3 91.3 90.8  MCH 30.8 31.7 31.2  MCHC 33.0 34.7 34.4  RDW 14.5 14.0 13.9  PLT 156 159 167   Thyroid No results for input(s): "TSH", "FREET4" in the last 168 hours.  BNP Recent Labs  Lab 09/18/22 0506  BNP 110.1*    DDimer No results for input(s): "DDIMER" in the last 168 hours.   Radiology    DG Chest 2 View  Result Date: 09/18/2022 CLINICAL DATA:  Acute on chronic respiratory failure with hypoxia. COPD exacerbation EXAM: CHEST - 2 VIEW COMPARISON:  Chest 09/12/2022 FINDINGS: Endotracheal tube removed.  NG removed. Interval improvement in left lower lobe  airspace disease. Mild residual left lower lobe airspace disease. Right lung clear Negative for heart failure or edema.  No effusion IMPRESSION: Interval improvement in left lower lobe airspace disease. Endotracheal tube removed. Mild Electronically Signed   By: 09/14/2022 M.D.   On: 09/18/2022 09:32    Cardiac Studies   Cath showed 3/4 grafts patent  Patient Profile     71 y.o. male with PAF, SVT  Assessment & Plan    CAD: No targets for PCI.  Continue medical therapy.  PAF: Has been maintaining sinus rhythm after getting IV amiodarone.  Will decrease p.o. amiodarone to 200 mg/day.  He will follow-up with Dr. Call with.  Anticoagulation restarted.  Maintaining sinus rhythm is important for him to avoid loss of atrial kick and thus congestive heart failure exacerbation.  He will need his thyroid function and liver function followed routinely.  PAD: Status post left subclavian stent at [facility] in 2023.  New  Tobacco abuse: We spoke at length about this.  He has tried vaping, nicotine patch in the past to stop smoking.  He is motivated to try to stop again.  I encouraged him to try to abstain as his COPD flares can often trigger arrhythmias.       For questions or updates, please contact [facility] Please consult [email or web address] for contact info under        Signed, 2024, MD  09/19/2022, 12:05 PM

## 2022-09-20 ENCOUNTER — Encounter (HOSPITAL_COMMUNITY): Payer: Self-pay | Admitting: Internal Medicine

## 2022-09-20 DIAGNOSIS — J9621 Acute and chronic respiratory failure with hypoxia: Secondary | ICD-10-CM | POA: Diagnosis not present

## 2022-09-20 LAB — BASIC METABOLIC PANEL
Anion gap: 12 (ref 5–15)
BUN: 21 mg/dL (ref 8–23)
CO2: 32 mmol/L (ref 22–32)
Calcium: 9.1 mg/dL (ref 8.9–10.3)
Chloride: 95 mmol/L — ABNORMAL LOW (ref 98–111)
Creatinine, Ser: 0.96 mg/dL (ref 0.61–1.24)
GFR, Estimated: >60 mL/min (ref >60)
Glucose, Bld: 211 mg/dL — ABNORMAL HIGH (ref 70–99)
Potassium: 4.5 mmol/L (ref 3.5–5.1)
Sodium: 139 mmol/L (ref 135–145)

## 2022-09-20 LAB — CBC
HCT: 43.8 % (ref 39.0–52.0)
Hemoglobin: 14.5 g/dL (ref 13.0–17.0)
MCH: 31 pg (ref 26.0–34.0)
MCHC: 33.1 g/dL (ref 30.0–36.0)
MCV: 93.8 fL (ref 80.0–100.0)
Platelets: 230 10*3/uL (ref 150–400)
RBC: 4.67 MIL/uL (ref 4.22–5.81)
RDW: 14 % (ref 11.5–15.5)
WBC: 18.1 10*3/uL — ABNORMAL HIGH (ref 4.0–10.5)
nRBC: 0 % (ref 0.0–0.2)

## 2022-09-20 LAB — GLUCOSE, CAPILLARY: Glucose-Capillary: 182 mg/dL — ABNORMAL HIGH (ref 70–99)

## 2022-09-20 MED ORDER — PREDNISONE 10 MG PO TABS: ORAL_TABLET | ORAL | 0 refills | Status: AC

## 2022-09-20 MED ORDER — AMIODARONE HCL 200 MG PO TABS: 200.0000 mg | ORAL_TABLET | Freq: Every day | ORAL | 0 refills | Status: DC

## 2022-09-20 MED ORDER — DIAZEPAM 5 MG PO TABS: 5.0000 mg | ORAL_TABLET | Freq: Two times a day (BID) | ORAL | 0 refills | Status: DC | PRN

## 2022-09-20 NOTE — Plan of Care (Signed)
  Problem: Education: Goal: Knowledge of General Education information will improve Description: Including pain rating scale, medication(s)/side effects and non-pharmacologic comfort measures Outcome: Adequate for Discharge   Problem: Health Behavior/Discharge Planning: Goal: Ability to manage health-related needs will improve Outcome: Adequate for Discharge   Problem: Clinical Measurements: Goal: Ability to maintain clinical measurements within normal limits will improve Outcome: Adequate for Discharge Goal: Will remain free from infection Outcome: Adequate for Discharge Goal: Diagnostic test results will improve Outcome: Adequate for Discharge Goal: Respiratory complications will improve Outcome: Adequate for Discharge Goal: Cardiovascular complication will be avoided Outcome: Adequate for Discharge   Problem: Activity: Goal: Risk for activity intolerance will decrease Outcome: Adequate for Discharge   Problem: Nutrition: Goal: Adequate nutrition will be maintained Outcome: Adequate for Discharge   Problem: Coping: Goal: Level of anxiety will decrease Outcome: Adequate for Discharge   Problem: Elimination: Goal: Will not experience complications related to bowel motility Outcome: Adequate for Discharge Goal: Will not experience complications related to urinary retention Outcome: Adequate for Discharge   Problem: Pain Managment: Goal: General experience of comfort will improve Outcome: Adequate for Discharge   Problem: Safety: Goal: Ability to remain free from injury will improve Outcome: Adequate for Discharge   Problem: Skin Integrity: Goal: Risk for impaired skin integrity will decrease Outcome: Adequate for Discharge   Problem: Activity: Goal: Ability to tolerate increased activity will improve Outcome: Adequate for Discharge   Problem: Respiratory: Goal: Ability to maintain a clear airway and adequate ventilation will improve Outcome: Adequate for  Discharge   Problem: Role Relationship: Goal: Method of communication will improve Outcome: Adequate for Discharge   Problem: Education: Goal: Ability to describe self-care measures that may prevent or decrease complications (Diabetes Survival Skills Education) will improve Outcome: Adequate for Discharge Goal: Individualized Educational Video(s) Outcome: Adequate for Discharge   Problem: Coping: Goal: Ability to adjust to condition or change in health will improve Outcome: Adequate for Discharge   Problem: Fluid Volume: Goal: Ability to maintain a balanced intake and output will improve Outcome: Adequate for Discharge   Problem: Health Behavior/Discharge Planning: Goal: Ability to identify and utilize available resources and services will improve Outcome: Adequate for Discharge Goal: Ability to manage health-related needs will improve Outcome: Adequate for Discharge   Problem: Metabolic: Goal: Ability to maintain appropriate glucose levels will improve Outcome: Adequate for Discharge   Problem: Nutritional: Goal: Maintenance of adequate nutrition will improve Outcome: Adequate for Discharge Goal: Progress toward achieving an optimal weight will improve Outcome: Adequate for Discharge   Problem: Skin Integrity: Goal: Risk for impaired skin integrity will decrease Outcome: Adequate for Discharge   Problem: Tissue Perfusion: Goal: Adequacy of tissue perfusion will improve Outcome: Adequate for Discharge   Problem: Education: Goal: Understanding of CV disease, CV risk reduction, and recovery process will improve Outcome: Adequate for Discharge Goal: Individualized Educational Video(s) Outcome: Adequate for Discharge   Problem: Activity: Goal: Ability to return to baseline activity level will improve Outcome: Adequate for Discharge   Problem: Cardiovascular: Goal: Ability to achieve and maintain adequate cardiovascular perfusion will improve Outcome: Adequate for  Discharge Goal: Vascular access site(s) Level 0-1 will be maintained Outcome: Adequate for Discharge   Problem: Health Behavior/Discharge Planning: Goal: Ability to safely manage health-related needs after discharge will improve Outcome: Adequate for Discharge

## 2022-09-20 NOTE — TOC Transition Note (Addendum)
Transition of Care [facility]) - CM/SW Discharge Note   Patient Details  Name: Dennis Preston MRN: [id number] Date of Birth: August 03, 1951  [facility] CM/SW Contact:  Bess Kinds, RN Phone Number: [phone] 09/20/2022, 10:17 AM   Clinical Narrative:     Spoke with patient on his cell phone to discuss post acute transition. He is agreeable to [facility] PT - he has received HH in the past and would like to use same agency. Patient requests that RNCM call his wife. Spoke with wife on her cell phone who advised that [facility] agency previously used is [facility], [facility]). Discussed possible delay in start of care d/t holiday, wife is fine with start of care on Tuesday. Referral sent to [facility]. HH PT order requested from provider. Wife to provide transportation home. No further TOC needs identified at this time.   UPDATE: [facility] accepted referral with SOC for Tuesday, 09/23/22. MD provided [facility] PT order. MD advised of SOC.   Final next level of care: Home w Home Health Services Barriers to Discharge: No Barriers Identified   Patient Goals and CMS Choice CMS Medicare.gov Compare Post Acute Care list provided to:: Patient Choice offered to / list presented to : Patient, Spouse  Discharge Placement                         Discharge Plan and Services Additional resources added to the After Visit Summary for                  DME Arranged: N/A DME Agency: NA       HH Arranged: PT HH Agency: [facility] Date HH Agency Contacted: 09/20/22 Time HH Agency Contacted: 1016 Representative spoke with at [facility] Agency: Barbara Cower  Social Determinants of Health (SDOH) Interventions SDOH Screenings   Tobacco Use: High Risk (09/17/2022)     Readmission Risk Interventions     No data to display

## 2022-09-20 NOTE — Progress Notes (Signed)
Mobility Specialist: Progress Note   09/20/22 1011  Mobility  Activity Ambulated with assistance in hallway  Level of Assistance Contact guard assist, steadying assist  Assistive Device Front wheel walker  Distance Ambulated (ft) 130 ft  Activity Response Tolerated well  Mobility Referral Yes  $Mobility charge 1 Mobility   Pre-Mobility 2 L/min [place]: 76 HR, 94% SpO2 Post-Mobility 2 L/min [place]: 82 HR, 96% SpO2  Pt received in the bed and agreeable to mobility. Mod I with bed mobility and contact guard during ambulation. Ambulated on 3 L/min [place]. No c/o throughout. Pt sitting EOB  after session with call bell at his side and RN present in the room.   Jady Braggs Mobility Specialist Please contact via SecureChat or Rehab office at [phone]

## 2022-09-20 NOTE — Discharge Summary (Signed)
Physician Discharge Summary   Patient: Dennis Preston MRN: [id number] DOB: 04/15/1951  Admit date:     09/12/2022  Discharge date: 09/20/22  Discharge Physician: Tyrone Nineyan B Rachelanne Whidby   PCP: Barbette ReichmannHande, Vishwanath, MD   Recommendations at discharge:  Follow up with PCP in next 1-2 weeks with repeat BMP, CBC.  Follow up with cardiology, Dr. Juliann Paresallwood in 1-2 weeks with recheck HR/rhythm, discharged on amiodarone. Continuing medical therapy for severe CAD Follow up with pulmonology after discharge. Pt to seek alternative pulmonary care, will get referral from PCP and/or cardiology. Given information for local pulmonary clinic.  Discharge Diagnoses: Principal Problem:   Respiratory failure (HCC) Active Problems:   Non-ST elevation (NSTEMI) myocardial infarction (HCC)   PAF (paroxysmal atrial fibrillation) (HCC)   Wide-complex tachycardia   Acute metabolic encephalopathy   Acute diastolic heart failure [facility](HCC)  Hospital Course: Dennis AfricaDonald R Alsip is a 71 y.o. male with a history of COPD, HFpEF, PAF, CAD s/p 4v CABG 2016, HTN, HLD, PAD, tobacco use who presented to the ED on 09/12/2022. [facility] subclavian steal with stent placement. EMS was notified due to shortness of breath decreased level of consciousness while in the ambulance he required bagging to maintain adequate ventilation [facility] department he was intubated. He did have a wide-complex tachycardia that resolved. He was treated with antibiotics, IV steroids, nebulizer therapies. Ultimately, he was extubated 12/17. LHC 12/19 showed stable coronary disease for which continued medical therapy is recommended.   Assessment and Plan: Acute on chronic hypoxic respiratory failure: Due to COPD exacerbation, probable pneumonia (CXR opacity, leukocytosis) as well as acute HFpEF.  - Wheezing resolved durably, given severity of lung disease, will prescribe longer prednisone taper at DC -  Continue trelegy and albuterol nebulizer as needed. - Completed antibiotics  - Needs IS, FV, mobilizing, PT/OT to continue at home. Ambulating halls with minimal/baseline dyspnea. Will follow up with pulmonary (either Dr. Meredeth IdeFleming or alternative)   Type II NSTEMI, CAD s/p CABG:  - Continue plavix, anticoagulation, statin. No BB due to bradycardia. - T wave inversions, abnormal ECG. Pt has no chest pain, improved respiratory status. Had LHC showing no new disease or intervention needed.   SVT, intermittent LBBB: Per cardiology.  - Continue amiodarone 200mg  daily per cardiology and follow up with Dr. Juliann Paresallwood.     Acute on chronic HFpEF: Echo showed LVEF 65%, G1DD, RVSF mildly down.  - Responded well to IV diuresis and appears nearly euvolemic on day of discharge, will resume home diuretic dosing   Prediabetes: HbA1c 6.2%.  - PCP follow up    PAF:  - Currently with intermittently slow ventricular response, had bradycardia with beta blocker in the past, so avoiding this.  - Continue eliquis - Not on rate control agent at this time.    Anxiety:  - prn valium worked well in the hospital without sedation. Pt is well aware of risks of taking this medication, will not take opioids or ambien or other benzo's, short course prescribed as needed at discharge.  AKI: Resolved.    HTN: Stable.   HLD:  - Continue high intensity statin. LDL is at goal at 45.   PAD, s/p left subclavian artery stent 2023:  - Continue plavix, statin   Tobacco use:  - Nicotine patch   GERD:  - PPI  Consultants: PCCM, cardiology Procedures performed: LHC 09/16/2022 Dr. Tresa EndoKelly:    Prox RCA lesion is 75% stenosed.  Mid RCA lesion is 80% stenosed.   Mid LAD lesion is 60% stenosed.   Mid LM to Dist LM lesion is 85% stenosed.   Ramus lesion is 85% stenosed.   RPAV-1 lesion is 75% stenosed.   Ost Cx lesion is 90% stenosed.   Dist LM to Ost LAD lesion is 85% stenosed.   RPDA lesion is 95% stenosed.   RPAV-2  lesion is 70% stenosed.   1st Mrg lesion is 85% stenosed.   Origin to Prox Graft lesion is 100% stenosed.   Severe multivessel native CAD with 85% distal left main stenosis ostial LAD ramus intermediate and left circumflex stenoses.   The LAD has a percent proximal stenosis prior to the LIMA takeoff and there is competitive filling via the LIMA graft with the LAD injection.   This intermediate stenosis with ostial stenosis but with good competitive filling via the patent graft.   Left circumflex vessel with a 5% ostial stenosis.  The first marginal vessel is small caliber and has diffuse 8085% stenosis in the midsegment.   RCA is a dominant vessel that has diffuse 75% mid stenosis 80 to 70% mid distal stenosis, 95% ostial PDA stenosis with 70% mid continuation branch stenosis.  Competitive filling is seen in the posterolateral vessel.  No competitive filling to the PDA.   Patent vein graft supplying the ramus intermediate vessel.   Old occluded vein graft which had supplied the OM1 vessel.   Patent vein graft which supplies the PLA vessel.  There is 40% stenosis in the distal third portion of the graft.  This graft was sequential graft which also had supplied the PDA vessel which is not visualized at this segment is occluded.     Patent subclavian artery stent arising from the aorta.   Patent LIMA graft supplying the mid LAD.   RECOMMENDATION: Resume the patient's prior Plavix/Eliquis with history of multi- vessel CAD, subclavian stent, and PAF. Medical therapy for CAD.  Rest of lipid-lowering therapy with target LDL less than 70.  Disposition: Home Diet recommendation:  Discharge Diet Orders (From admission, onward)     Start     Ordered   09/20/22 0000  Diet - low sodium heart healthy        09/20/22 0906           Cardiac and Carb modified diet DISCHARGE MEDICATION: Allergies as of 09/20/2022       Reactions   Chlorhexidine         Medication List     STOP  taking these medications    zolpidem 5 MG tablet Commonly known as: AMBIEN       TAKE these medications    albuterol 108 (90 Base) MCG/ACT inhaler Commonly known as: VENTOLIN HFA Inhale 1-2 puffs into the lungs every 6 (six) hours as needed for wheezing or shortness of breath.   albuterol (2.5 MG/3ML) 0.083% nebulizer solution Commonly known as: PROVENTIL Take 3 mLs (2.5 mg total) by nebulization every 6 (six) hours as needed for wheezing or shortness of breath.   amiodarone 200 MG tablet Commonly known as: PACERONE Take 1 tablet (200 mg total) by mouth daily.   atorvastatin 80 MG tablet Commonly known as: LIPITOR Take 1 tablet (80 mg total) by mouth daily at 6 PM.   clopidogrel 75 MG tablet Commonly known as: PLAVIX Take 75 mg by mouth every evening.   diazepam 5 MG tablet Commonly known as: VALIUM Take 1 tablet (5 mg total) by mouth every  12 (twelve) hours as needed for anxiety.   Eliquis 5 MG Tabs tablet Generic drug: apixaban Take 5 mg by mouth 2 (two) times daily.   furosemide 40 MG tablet Commonly known as: LASIX Take 40 mg by mouth 2 (two) times daily.   losartan 25 MG tablet Commonly known as: COZAAR Take 25 mg by mouth daily.   nicotine 21 mg/24hr patch Commonly known as: NICODERM CQ - dosed in mg/24 hours Place 1 patch (21 mg total) onto the skin daily.   OXYGEN Inhale 2 L into the lungs daily as needed (activity).   predniSONE 10 MG tablet Commonly known as: DELTASONE Take 4 tablets (40 mg total) by mouth daily with breakfast for 3 days, THEN 3 tablets (30 mg total) daily with breakfast for 3 days, THEN 2 tablets (20 mg total) daily with breakfast for 3 days, THEN 1 tablet (10 mg total) daily with breakfast for 3 days. Start taking on: September 20, 2022   roflumilast 500 MCG Tabs tablet Commonly known as: DALIRESP Take 500 mcg by mouth daily.   spironolactone 25 MG tablet Commonly known as: ALDACTONE Take 12.5 mg by mouth daily.   Trelegy  Ellipta 100-62.5-25 MCG/ACT Aepb Generic drug: Fluticasone-Umeclidin-Vilant Inhale 1 puff into the lungs every other day.        Follow-up Information     Tracie Harrier, MD Follow up.   Specialty: Internal Medicine Contact information: [place] [address] [phone]         Yolonda Kida, MD Follow up.   Specialties: Cardiology, Internal Medicine Contact information: [place] [address] [phone]         Erby Pian, MD. Schedule an appointment as soon as possible for a visit.   Specialty: Specialist Contact information: [place] [address] [phone]         [facility] Follow up.   Specialty: Pulmonology Contact information: [place] [address] [place] [id number] [phone]               Discharge Exam: Danley Danker Weights   09/17/22 0500 09/18/22 0251 09/19/22 0500  Weight: 73.1 kg 76.3 kg 75.8 kg  BP (!) 140/69   Pulse 71   Temp 97.7 F (36.5 C) (Oral)   Resp 20   Ht 5\' 5"  (1.651 m)   Wt 75.8 kg   SpO2 95%   BMI 27.81 kg/m   Pleasant, well-appearing male in no distress, wants to go home feels breathing is better than it has been in a while. Walking halls with 2L O2 no desaturations or severe dyspnea Clear, diminished, no wheezes or crackles RRR, no pitting edema or JVD +BS, soft, NT  Condition at discharge: stable  The results of significant diagnostics from this hospitalization (including imaging, microbiology, ancillary and laboratory) are listed below for reference.   Imaging Studies: DG Chest 2 View  Result Date: 09/18/2022 CLINICAL DATA:  Acute on chronic respiratory failure with hypoxia. COPD exacerbation EXAM: CHEST - 2 VIEW COMPARISON:  Chest 09/12/2022 FINDINGS: Endotracheal tube removed.  NG removed. Interval improvement in left lower lobe airspace disease. Mild residual left  lower lobe airspace disease. Right lung clear Negative for heart failure or edema.  No effusion IMPRESSION: Interval improvement in left lower lobe airspace disease. Endotracheal tube removed. Mild Electronically Signed   By: Franchot Gallo M.D.   On: 09/18/2022 09:32   CARDIAC CATHETERIZATION  Result  Date: 09/16/2022   Prox RCA lesion is 75% stenosed.   Mid RCA lesion is 80% stenosed.   Mid LAD lesion is 60% stenosed.   Mid LM to Dist LM lesion is 85% stenosed.   Ramus lesion is 85% stenosed.   RPAV-1 lesion is 75% stenosed.   Ost Cx lesion is 90% stenosed.   Dist LM to Ost LAD lesion is 85% stenosed.   RPDA lesion is 95% stenosed.   RPAV-2 lesion is 70% stenosed.   1st Mrg lesion is 85% stenosed.   Origin to Prox Graft lesion is 100% stenosed. Severe multivessel native CAD with 85% distal left main stenosis ostial LAD ramus intermediate and left circumflex stenoses. The LAD has a percent proximal stenosis prior to the LIMA takeoff and there is competitive filling via the LIMA graft with the LAD injection. This intermediate stenosis with ostial stenosis but with good competitive filling via the patent graft. Left circumflex vessel with a 5% ostial stenosis.  The first marginal vessel is small caliber and has diffuse 8085% stenosis in the midsegment. RCA is a dominant vessel that has diffuse 75% mid stenosis 80 to 70% mid distal stenosis, 95% ostial PDA stenosis with 70% mid continuation branch stenosis.  Competitive filling is seen in the posterolateral vessel.  No competitive filling to the PDA. Patent vein graft supplying the ramus intermediate vessel. Old occluded vein graft which had supplied the OM1 vessel. Patent vein graft which supplies the PLA vessel.  There is 40% stenosis in the distal third portion of the graft.  This graft was sequential graft which also had supplied the PDA vessel which is not visualized at this segment is occluded.  Patent subclavian artery stent arising from the aorta. Patent  LIMA graft supplying the mid LAD. RECOMMENDATION: Resume the patient's prior Plavix/Eliquis with history of multi- vessel CAD, subclavian stent, and PAF. Medical therapy for CAD.  Rest of lipid-lowering therapy with target LDL less than 70.   ECHOCARDIOGRAM COMPLETE  Result Date: 09/12/2022    ECHOCARDIOGRAM REPORT   Patient Name:   DEAN GOLDNER Date of Exam: 09/12/2022 Medical Rec #:  [id number]       Height:       65.0 in Accession #:    [id number]      Weight:       147.9 lb Date of Birth:  [birth year]      BSA:          1.740 m Patient Age:    71 years        BP:           98/53 mmHg Patient Gender: M               HR:           65 bpm. Exam Location:  Inpatient Procedure: 2D Echo, Cardiac Doppler, Color Doppler and Intracardiac            Opacification Agent Indications:    [id number] Subsequent ST elevation (STEM) and non-ST elevation                 (NSTEMI) myocardial infarction  History:        Patient has prior history of Echocardiogram examinations, most                 recent 07/04/2022. CAD, Abnormal ECG and Prior CABG,                 Arrythmias:Atrial Fibrillation and VT, Signs/Symptoms:Shortness  of Breath, Dyspnea and Chest Pain; Risk Factors:Hypertension.                 Pulmonary edema.  Sonographer:    Roseanna Rainbow RDCS Referring Phys: Margie Billet  Sonographer Comments: Technically difficult study due to poor echo windows, suboptimal parasternal window, suboptimal subcostal window and echo performed with patient supine and on artificial respirator. Image acquisition challenging due to patient body habitus and Image acquisition challenging due to COPD. IMPRESSIONS  1. Left ventricular ejection fraction, by estimation, is 60 to 65%. The left ventricle has normal function. The left ventricle has no regional wall motion abnormalities. Left ventricular diastolic parameters are consistent with Grade I diastolic dysfunction (impaired relaxation).  2. Right ventricular systolic  function is mildly reduced. The right ventricular size is normal.  3. The mitral valve is grossly normal. No evidence of mitral valve regurgitation. No evidence of mitral stenosis.  4. The aortic valve was not well visualized. Aortic valve regurgitation is not visualized. No aortic stenosis is present. FINDINGS  Left Ventricle: Left ventricular ejection fraction, by estimation, is 60 to 65%. The left ventricle has normal function. The left ventricle has no regional wall motion abnormalities. The left ventricular internal cavity size was normal in size. There is  no left ventricular hypertrophy. Left ventricular diastolic parameters are consistent with Grade I diastolic dysfunction (impaired relaxation). Right Ventricle: The right ventricular size is normal. No increase in right ventricular wall thickness. Right ventricular systolic function is mildly reduced. Left Atrium: Left atrial size was normal in size. Right Atrium: Right atrial size was normal in size. Pericardium: Trivial pericardial effusion is present. Mitral Valve: The mitral valve is grossly normal. No evidence of mitral valve regurgitation. No evidence of mitral valve stenosis. Tricuspid Valve: The tricuspid valve is grossly normal. Tricuspid valve regurgitation is not demonstrated. Aortic Valve: The aortic valve was not well visualized. Aortic valve regurgitation is not visualized. No aortic stenosis is present. Pulmonic Valve: The pulmonic valve was not well visualized. Pulmonic valve regurgitation is not visualized. Aorta: The aortic root was not well visualized. Venous: IVC assessment for right atrial pressure unable to be performed due to mechanical ventilation. IAS/Shunts: The interatrial septum was not well visualized.  LEFT VENTRICLE PLAX 2D LVOT diam:     2.40 cm     Diastology LV SV:         87          LV e' medial:    2.94 cm/s LV SV Index:   50          LV E/e' medial:  16.3 LVOT Area:     4.52 cm    LV e' lateral:   7.29 cm/s                             LV E/e' lateral: 6.6  LV Volumes (MOD) LV vol d, MOD A2C: 81.6 ml LV vol d, MOD A4C: 58.4 ml LV vol s, MOD A2C: 25.0 ml LV vol s, MOD A4C: 21.2 ml LV SV MOD A2C:     56.6 ml LV SV MOD A4C:     58.4 ml LV SV MOD BP:      46.3 ml RIGHT VENTRICLE            IVC RV S prime:     4.43 cm/s  IVC diam: 2.10 cm TAPSE (M-mode): 0.7 cm LEFT ATRIUM  Index        RIGHT ATRIUM           Index LA Vol (A2C):   38.8 ml 22.30 ml/m  RA Area:     10.40 cm LA Vol (A4C):   18.3 ml 10.52 ml/m  RA Volume:   19.90 ml  11.44 ml/m LA Biplane Vol: 29.4 ml 16.89 ml/m  AORTIC VALVE LVOT Vmax:   100.00 cm/s LVOT Vmean:  59.800 cm/s LVOT VTI:    0.193 m MITRAL VALVE MV Area (PHT): 2.49 cm    SHUNTS MV Decel Time: 305 msec    Systemic VTI:  0.19 m MV E velocity: 48.00 cm/s  Systemic Diam: 2.40 cm MV A velocity: 73.25 cm/s MV E/A ratio:  0.66 Oswaldo Milian MD Electronically signed by Oswaldo Milian MD Signature Date/Time: 09/12/2022/3:03:50 PM    Final    DG Abd 1 View  Result Date: 09/12/2022 CLINICAL DATA:  Nasogastric tube placement EXAM: ABDOMEN - 1 VIEW COMPARISON:  Chest radiograph of earlier today FINDINGS: Nasogastric tube has been replaced/repositioned. Terminates at the gastric fundus with side port below the gastroesophageal junction. No gross free intraperitoneal air. Airspace disease throughout the left lung again identified. Median sternotomy for CABG. Incompletely imaged endotracheal tube. IMPRESSION: Nasogastric tube terminating in the gastric fundus with side port well below the gastroesophageal junction. Electronically Signed   By: Abigail Miyamoto M.D.   On: 09/12/2022 10:26   CT HEAD WO CONTRAST (5MM)  Result Date: 09/12/2022 CLINICAL DATA:  Provided history: Altered mental status, nontraumatic. EXAM: CT HEAD WITHOUT CONTRAST TECHNIQUE: Contiguous axial images were obtained from the base of the skull through the vertex without intravenous contrast. RADIATION DOSE REDUCTION: This  exam was performed according to the departmental dose-optimization program which includes automated exposure control, adjustment of the mA and/or kV according to patient size and/or use of iterative reconstruction technique. COMPARISON:  Prior head CT examinations 07/03/2022 and earlier. FINDINGS: Brain: Mild generalized cerebral atrophy. Mild ill-defined hypoattenuation within the cerebral white matter, nonspecific but compatible with chronic small vessel disease. 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 There is no acute intracranial hemorrhage. No demarcated cortical infarct. No extra-axial fluid collection. No evidence of an intracranial mass. No midline shift. Vascular: No hyperdense vessel. Skull: No fracture or aggressive osseous lesion. Sinuses/Orbits: No mass or acute finding within the imaged orbits. Mild mucosal thickening within the right frontal sinus. Mild-to-moderate mucosal thickening within the left frontal sinus. Mucosal thickening scattered within the bilateral ethmoid air cells, overall mild-to-moderate in severity. Mild mucosal thickening within the right sphenoid sinus. Mild mucosal thickening within the right maxillary sinus. Other: Partially imaged ET tube. Congenital nonunion of the posterior arch of C1. IMPRESSION: 1. No evidence of acute intracranial abnormality. 2. Mild chronic small-vessel image changes within the cerebral white matter. 3. 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 4. Mild generalized cerebral atrophy. 5. Paranasal sinus disease, as described. Electronically Signed   By: Kellie Simmering D.O.   On: 09/12/2022 09:38   DG Chest Port 1 View  Result Date: 09/12/2022 CLINICAL DATA:  Intubation EXAM: PORTABLE CHEST 1 VIEW COMPARISON:  07/04/2022 FINDINGS: Endotracheal tube with tip below the clavicular heads and above the carina. The enteric tube loops through the mid esophagus and then  again at the lower neck with tip at the thoracic inlet. Artifact from support hardware. Infiltrate asymmetric in the left lung, similar appearance was seen on prior. No  visible effusion or pneumothorax of note. Trace pleural fluid is possible on the left. Normal heart size. CABG and left subclavian stenting. Tube positioning was directly communicated in epic. IMPRESSION: 1. Malpositioned enteric tube which loops through the esophagus and lower neck. 2. Unremarkable endotracheal tube. 3. Infiltrate in the left lung that was also noted in October and could be recurrent or persistent, need follow-up. Electronically Signed   By: Jorje Guild M.D.   On: 09/12/2022 07:38    Microbiology: Results for orders placed or performed during the hospital encounter of 09/12/22  Resp panel by RT-PCR (RSV, Flu A&B, Covid) Anterior Nasal Swab     Status: None   Collection Time: 09/12/22  7:06 AM   Specimen: Anterior Nasal Swab  Result Value Ref Range Status   SARS Coronavirus 2 by RT PCR NEGATIVE NEGATIVE Final    Comment: (NOTE) SARS-CoV-2 target nucleic acids are NOT DETECTED.  The SARS-CoV-2 RNA is generally detectable in upper respiratory specimens during the acute phase of infection. The lowest concentration of SARS-CoV-2 viral copies this assay can detect is 138 copies/mL. A negative result does not preclude SARS-Cov-2 infection and should not be used as the sole basis for treatment or other patient management decisions. A negative result may occur with  improper specimen collection/handling, submission of specimen other than nasopharyngeal swab, presence of viral mutation(s) within the areas targeted by this assay, and inadequate number of viral copies(<138 copies/mL). A negative result must be combined with clinical observations, patient history, and epidemiological information. The expected result is Negative.  Fact Sheet for Patients:  EntrepreneurPulse.com.au  Fact Sheet for  Healthcare Providers:  IncredibleEmployment.be  This test is no t yet approved or cleared by the [place] FDA and  has been authorized for detection and/or diagnosis of SARS-CoV-2 by FDA under an Emergency Use Authorization (EUA). This EUA will remain  in effect (meaning this test can be used) for the duration of the COVID-19 declaration under Section 564(b)(1) of the Act, 21 U.S.C.section 360bbb-3(b)(1), unless the authorization is terminated  or revoked sooner.       Influenza A by PCR NEGATIVE NEGATIVE Final   Influenza B by PCR NEGATIVE NEGATIVE Final    Comment: (NOTE) The Xpert Xpress SARS-CoV-2/FLU/RSV plus assay is intended as an aid in the diagnosis of influenza from Nasopharyngeal swab specimens and should not be used as a sole basis for treatment. Nasal washings and aspirates are unacceptable for Xpert Xpress SARS-CoV-2/FLU/RSV testing.  Fact Sheet for Patients: EntrepreneurPulse.com.au  Fact Sheet for Healthcare Providers: IncredibleEmployment.be  This test is not yet approved or cleared by the [place] FDA and has been authorized for detection and/or diagnosis of SARS-CoV-2 by FDA under an Emergency Use Authorization (EUA). This EUA will remain in effect (meaning this test can be used) for the duration of the COVID-19 declaration under Section 564(b)(1) of the Act, 21 U.S.C. section 360bbb-3(b)(1), unless the authorization is terminated or revoked.     Resp Syncytial Virus by PCR NEGATIVE NEGATIVE Final    Comment: (NOTE) Fact Sheet for Patients: EntrepreneurPulse.com.au  Fact Sheet for Healthcare Providers: IncredibleEmployment.be  This test is not yet approved or cleared by the [place] FDA and has been authorized for detection and/or diagnosis of SARS-CoV-2 by FDA under an Emergency Use Authorization (EUA). This EUA will remain in effect (meaning this  test can be used) for the duration of the COVID-19 declaration under Section 564(b)(1) of the Act, 21 U.S.C. section 360bbb-3(b)(1), unless the authorization is terminated  or revoked.  Performed at [facility], [place] [address], [place], [place] [address]   Blood culture (routine x 2)     Status: None   Collection Time: 09/12/22 10:16 AM   Specimen: BLOOD LEFT HAND  Result Value Ref Range Status   Specimen Description BLOOD LEFT HAND  Final   Special Requests   Final    BOTTLES DRAWN AEROBIC AND ANAEROBIC Blood Culture results may not be optimal due to an inadequate volume of blood received in culture bottles   Culture   Final    NO GROWTH 5 DAYS Performed at [facility], [place] [address] [place], [place] [address]    Report Status 09/17/2022 FINAL  Final  Blood culture (routine x 2)     Status: None   Collection Time: 09/12/22 10:19 AM   Specimen: BLOOD  Result Value Ref Range Status   Specimen Description BLOOD THUMB  Final   Special Requests   Final    BOTTLES DRAWN AEROBIC ONLY Blood Culture results may not be optimal due to an inadequate volume of blood received in culture bottles   Culture   Final    NO GROWTH 5 DAYS Performed at [facility], [place] [address] [place], [place] [address]    Report Status 09/17/2022 FINAL  Final  MRSA Next Gen by PCR, Nasal     Status: None   Collection Time: 09/12/22 10:29 AM   Specimen: Nasal Mucosa; Nasal Swab  Result Value Ref Range Status   MRSA by PCR Next Gen NOT DETECTED NOT DETECTED Final    Comment: (NOTE) The GeneXpert MRSA Assay (FDA approved for NASAL specimens only), is one component of a comprehensive MRSA colonization surveillance program. It is not intended to diagnose MRSA infection nor to guide or monitor treatment for MRSA infections. Test performance is not FDA approved in patients less than 27 years old. Performed at [facility], [place] [address], [place], [place] [address]      Labs: CBC: Recent Labs  Lab 09/15/22 0325 09/16/22 0411 09/17/22 0621 09/18/22 0506 09/20/22 0723  WBC 13.7* 10.3 7.8 9.0 18.1*  HGB 13.4 12.4* 13.2 12.9* 14.5  HCT 40.1 37.6* 38.0* 37.5* 43.8  MCV 93.9 93.3 91.3 90.8 93.8  PLT 168 156 159 [id number]   Basic Metabolic Panel: Recent Labs  Lab 09/13/22 2008 09/13/22 2153 09/14/22 0348 09/15/22 0325 09/16/22 0411 09/17/22 0621 09/18/22 0506 09/19/22 0351 09/20/22 0723  NA  --    < > 138 141 141 141 139 141 139  K  --    < > 4.1 4.4 4.0 4.3 4.0 4.2 4.5  CL  --    < > 105 107 105 102 99 96* 95*  CO2  --    < > 24 27 31  32 34* 36* 32  GLUCOSE  --    < > 126* 112* 98 143* 131* 159* 211*  BUN  --    < > 27* 30* 22 18 16 16 21   CREATININE  --    < > 0.94 0.94 0.98 0.86 0.84 0.91 0.96  CALCIUM  --    < > 8.3* 8.4* 8.3* 8.8* 8.4* 9.2 9.1  MG 1.0*  --  2.5* 2.5*  --   --   --   --   --   PHOS 1.9*  --  3.3  --   --   --   --   --   --    < > =  values in this interval not displayed.   Liver Function Tests: No results for input(s): "AST", "ALT", "ALKPHOS", "BILITOT", "PROT", "ALBUMIN" in the last 168 hours. CBG: Recent Labs  Lab 09/19/22 0749 09/19/22 1128 09/19/22 1508 09/19/22 2111 09/20/22 0839  GLUCAP 147* 224* 216* 233* 182*    Discharge time spent: greater than 30 minutes.  Signed: Patrecia Pour, MD [facility] 09/20/2022

## 2022-10-13 MED ORDER — SODIUM CHLORIDE 0.9 % IV SOLN: INTRAVENOUS | Status: DC

## 2022-10-14 ENCOUNTER — Encounter: Payer: Self-pay | Admitting: Registered Nurse

## 2022-10-14 ENCOUNTER — Encounter
Admission: RE | Disposition: A | Discharge status: Home / Self Care | Payer: Self-pay | Source: Home / Self Care | Attending: Internal Medicine

## 2022-10-14 ENCOUNTER — Encounter: Payer: Self-pay | Admitting: Internal Medicine

## 2022-10-14 ENCOUNTER — Ambulatory Visit
Admission: RE | Admit: 2022-10-14 | Discharge: 2022-10-14 | Disposition: A | Discharge status: Home / Self Care | Payer: Medicare Other | Attending: Internal Medicine | Admitting: Internal Medicine

## 2022-10-14 DIAGNOSIS — I4891 Unspecified atrial fibrillation: Secondary | ICD-10-CM | POA: Diagnosis not present

## 2022-10-14 DIAGNOSIS — Z538 Procedure and treatment not carried out for other reasons: Secondary | ICD-10-CM | POA: Diagnosis not present

## 2022-10-14 DIAGNOSIS — I480 Paroxysmal atrial fibrillation: Secondary | ICD-10-CM

## 2022-10-14 HISTORY — PX: CARDIOVERSION: SHX1299

## 2022-10-14 SURGERY — CARDIOVERSION
Anesthesia: General

## 2022-10-14 MED ORDER — SUCCINYLCHOLINE CHLORIDE 200 MG/10ML IV SOSY
PREFILLED_SYRINGE | INTRAVENOUS | Status: AC
  Filled 2022-10-14: qty 10

## 2022-10-14 MED ORDER — EPINEPHRINE 1 MG/10ML IJ SOSY
PREFILLED_SYRINGE | INTRAMUSCULAR | Status: AC
  Filled 2022-10-14: qty 10

## 2022-10-14 MED ORDER — PROPOFOL 10 MG/ML IV BOLUS
INTRAVENOUS | Status: AC
  Filled 2022-10-14: qty 20

## 2022-10-14 MED ORDER — EPHEDRINE 5 MG/ML INJ
INTRAVENOUS | Status: AC
  Filled 2022-10-14: qty 5

## 2022-10-14 MED ORDER — PHENYLEPHRINE 80 MCG/ML (10ML) SYRINGE FOR IV PUSH (FOR BLOOD PRESSURE SUPPORT)
PREFILLED_SYRINGE | INTRAVENOUS | Status: AC
  Filled 2022-10-14: qty 10

## 2022-10-14 MED ORDER — ATROPINE SULFATE 1 MG/10ML IJ SOSY
PREFILLED_SYRINGE | INTRAMUSCULAR | Status: AC
  Filled 2022-10-14: qty 10

## 2022-10-14 NOTE — Anesthesia Preprocedure Evaluation (Deleted)
Anesthesia Evaluation  Patient identified by MRN, date of birth, ID band Patient awake    Reviewed: Allergy & Precautions, H&P , NPO status , Patient's Chart, lab work & pertinent test results, reviewed documented beta blocker date and time   Airway Mallampati: III   Neck ROM: full    Dental  (+) Poor Dentition   Pulmonary COPD,  COPD inhaler and oxygen dependent, Current Smoker   Pulmonary exam normal        Cardiovascular Exercise Tolerance: Poor hypertension, On Medications + CAD, + Past MI, + Peripheral Vascular Disease and +CHF  Atrial Fibrillation  Rhythm:regular Rate:Normal     Neuro/Psych  PSYCHIATRIC DISORDERS Anxiety Depression    negative neurological ROS     GI/Hepatic negative GI ROS, Neg liver ROS,,,  Endo/Other  negative endocrine ROS    Renal/GU negative Renal ROS  negative genitourinary   Musculoskeletal   Abdominal   Peds  Hematology negative hematology ROS (+)   Anesthesia Other Findings Past Medical History: No date: Alcohol abuse No date: Amaurosis fugax No date: Aortic atherosclerosis (HCC) No date: Atrial fibrillation (HCC) No date: Carotid stenosis No date: CHF (congestive heart failure) (HCC) No date: Coagulopathy ([place]) No date: COPD (chronic obstructive pulmonary disease) (HCC) No date: Coronary artery disease No date: Depression 07/13/2015: Hx of CABG     Comment:  LIMA-LAD, SVG-OM1, SVG-PDA, SVG-PL No date: Hyperlipemia No date: Hypertension No date: Leucocytosis No date: Liver dysfunction No date: Long term current use of clopidogrel No date: NSTEMI (non-ST elevated myocardial infarction) ([place])     Comment:  approx 2016 No date: Peripheral artery disease (HCC)     Comment:  legs No date: Tobacco abuse No date: Wears dentures     Comment:  full upper and lower Past Surgical History: 8/56/3149: APPLICATION OF WOUND VAC; Right     Comment:  Procedure: APPLICATION OF WOUND  VAC;  Surgeon: Algernon Huxley, MD;  Location: [facility] ORS;  Service: Vascular;                Laterality: Right; 07/10/2015: CARDIAC CATHETERIZATION; N/A     Comment:  Procedure: Right/Left Heart Cath and Coronary               Angiography;  Surgeon: Yolonda Kida, MD;  Location:               [place] [facility];  Service: Cardiovascular;                Laterality: N/A; 07/13/2015: CORONARY ARTERY BYPASS GRAFT     Comment:  [facility].  4 vessel 01/02/2021: ENDARTERECTOMY; Right     Comment:  Procedure: ENDARTERECTOMY CAROTID;  Surgeon: Algernon Huxley, MD;  Location: [facility] OR[facility];  Service: Vascular;                Laterality: Right; 05/23/2020: ENDARTERECTOMY FEMORAL; Bilateral     Comment:  Procedure: ENDARTERECTOMY FEMORAL BILATERAL SUPERFICIAL               FEMORAL ARTERY STENTS ;  Surgeon: Katha Cabal, MD;              Location: [facility];  Service: Vascular;  Laterality:               Bilateral; 07/22/2020: FALSE ANEURYSM REPAIR; Left  Comment:  Procedure: REPAIR LEFT FEMORAL PSEUDOANUERYSM;  Surgeon:              Bertram Denver, MD;  Location: [facility];  Service:               Vascular;  Laterality: Left; 05/23/2020: INSERTION OF ILIAC STENT; Left     Comment:  Procedure: INSERTION OF ILIAC STENT;  Surgeon: Renford Dills, MD;  Location: [facility];  Service: Vascular;                Laterality: Left; 09/16/2022: LEFT HEART CATH AND CORONARY ANGIOGRAPHY; N/A     Comment:  Procedure: LEFT HEART CATH AND CORONARY ANGIOGRAPHY;                Surgeon: Lennette Bihari, MD;  [facility];  Service: Cardiovascular;  Laterality: N/A; 01/23/2020: LEFT HEART CATH AND CORS/GRAFTS ANGIOGRAPHY; N/A     Comment:  Procedure: LEFT HEART CATH AND CORS/GRAFTS ANGIOGRAPHY;               Surgeon: Alwyn Pea, MD;  Location: [facility]              CV LAB;  Service: Cardiovascular;  Laterality: N/A; 02/18/2022: LEFT  HEART CATH AND CORS/GRAFTS ANGIOGRAPHY; N/A     Comment:  Procedure: LEFT HEART CATH AND CORS/GRAFTS ANGIOGRAPHY;               Surgeon: Armando Reichert, MD;  Location: [facility]               INVASIVE CV LAB;  Service: Cardiovascular;  Laterality:               N/A; 04/03/2020: LOWER EXTREMITY ANGIOGRAPHY; Right     Comment:  Procedure: LOWER EXTREMITY ANGIOGRAPHY;  Surgeon:               Renford Dills, MD;  Location: [facility];               Service: Cardiovascular;  Laterality: Right; 05/06/2019: OLECRANON BURSECTOMY; Left     Comment:  Procedure: OLECRANON BURSECTOMY AND DEBRIDEMENT;                Surgeon: Signa Kell, MD;  Location: [facility];  Service:              Orthopedics;  Laterality: Left; 06/22/2020: WOUND DEBRIDEMENT; Right     Comment:  Procedure: DEBRIDEMENT WOUND RIGHT GROIN;  Surgeon: Annice Needy, MD;  Location: [facility];  Service: Vascular;                Laterality: Right; BMI    Body Mass Index: 26.46 kg/m     Reproductive/Obstetrics negative OB ROS                             Anesthesia Physical Anesthesia Plan  ASA: 4  Anesthesia Plan: General   Post-op Pain Management:    Induction:   PONV Risk Score and Plan:   Airway Management Planned:   Additional Equipment:   Intra-op Plan:   Post-operative Plan:   Informed Consent: I have reviewed the patients History and Physical,  chart, labs and discussed the procedure including the risks, benefits and alternatives for the proposed anesthesia with the patient or authorized representative who has indicated his/her understanding and acceptance.     Dental Advisory Given  Plan Discussed with: CRNA  Anesthesia Plan Comments:        Anesthesia Quick Evaluation

## 2022-10-14 NOTE — Progress Notes (Signed)
Patient in NSR. [name] here to see patient. In agreement no shock needed. Patient to be discharged to home.

## 2022-10-15 ENCOUNTER — Institutional Professional Consult (permissible substitution): Payer: BC Managed Care – PPO | Admitting: Cardiology

## 2022-10-16 ENCOUNTER — Encounter: Payer: Self-pay | Admitting: Internal Medicine

## 2022-11-04 NOTE — Progress Notes (Deleted)
Electrophysiology Office Note:    Date:  11/04/2022   ID:  Dennis Preston, DOB 1951/04/01, MRN [id number]  PCP:  Tracie Harrier, MD  [facility] Cardiologist:  None  [facility] Electrophysiologist:  Vickie Epley, MD   Referring MD: Gladstone Pih, NP   Chief Complaint: Atrial fibrillation, coronary artery disease  History of Present Illness:    Dennis Preston is a 72 y.o. male who presents for an evaluation of atrial fibrillation and coronary artery disease at the request of the [place]. Their medical history includes atrial fibrillation, COPD, coronary artery disease, CABG, hypertension, tobacco abuse.  The patient last saw Dr. Larence Penning October 20, 2022.  The patient has extensive coronary artery disease, peripheral vascular disease.  His COPD is severe and he wears around-the-clock oxygen.  His atrial fibrillation/flutter has required cardioversion in January 2024.  He is on Eliquis for stroke prophylaxis.  The patient was seen by Dr. Myles Gip on September 13, 2022 for a wide-complex tachycardia.  This was thought to be aberrantly conducted atrial flutter.  He was started on amiodarone.  At the appointment with [name] recently, he was still taking the amiodarone and Eliquis.  He sent to me in clinic today for follow-up.      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, SVG-OM1, SVG-PDA, SVG-PL   Hyperlipemia    Hypertension    Leucocytosis    Liver dysfunction    Long term current use of clopidogrel    NSTEMI (non-ST elevated myocardial infarction) ([place])    approx 2016   Peripheral artery disease (HCC)    legs   Tobacco abuse    Wears dentures     full upper and lower    Past Surgical History:  Procedure Laterality Date   APPLICATION OF WOUND VAC Right 06/22/2020   Procedure: APPLICATION OF WOUND VAC;  Surgeon: Algernon Huxley, MD;  Location: [facility];  Service: Vascular;  Laterality: Right;   CARDIAC CATHETERIZATION N/A 07/10/2015   Procedure: Right/Left Heart Cath and Coronary Angiography;  Surgeon: Yolonda Kida, MD;  Location: [place] [facility];  Service: Cardiovascular;  Laterality: N/A;   CARDIOVERSION N/A 10/14/2022   Procedure: CARDIOVERSION;  Surgeon: Yolonda Kida, MD;  Location: [facility];  Service: Cardiovascular;  Laterality: N/A;   CORONARY ARTERY BYPASS GRAFT  07/13/2015   [facility].  4 vessel   ENDARTERECTOMY Right 01/02/2021   Procedure: ENDARTERECTOMY CAROTID;  Surgeon: Algernon Huxley, MD;  Location: [facility];  Service: Vascular;  Laterality: Right;   ENDARTERECTOMY FEMORAL Bilateral 05/23/2020   Procedure: ENDARTERECTOMY FEMORAL BILATERAL SUPERFICIAL FEMORAL ARTERY STENTS ;  Surgeon: Katha Cabal, MD;  Location: [facility];  Service: Vascular;  Laterality: Bilateral;   FALSE ANEURYSM REPAIR Left 07/22/2020   Procedure: REPAIR LEFT FEMORAL PSEUDOANUERYSM;  Surgeon: Evaristo Bury, MD;  Location: [facility];  Service: Vascular;  Laterality: Left;   INSERTION OF ILIAC STENT Left 05/23/2020   Procedure: INSERTION OF ILIAC STENT;  Surgeon: Katha Cabal, MD;  Location: [facility];  Service: Vascular;  Laterality: Left;   LEFT HEART CATH AND  CORONARY ANGIOGRAPHY N/A 09/16/2022   Procedure: LEFT HEART CATH AND CORONARY ANGIOGRAPHY;  Surgeon: Troy Sine, MD;  Location: [place] [facility];  Service: Cardiovascular;  Laterality: N/A;   LEFT HEART CATH AND CORS/GRAFTS ANGIOGRAPHY N/A 01/23/2020   Procedure: LEFT HEART CATH AND CORS/GRAFTS ANGIOGRAPHY;  Surgeon: Yolonda Kida, MD;  Location: [place] [facility];  Service: Cardiovascular;  Laterality: N/A;   LEFT HEART CATH AND CORS/GRAFTS ANGIOGRAPHY N/A 02/18/2022    Procedure: LEFT HEART CATH AND CORS/GRAFTS ANGIOGRAPHY;  Surgeon: Andrez Grime, MD;  Location: [place] [facility];  Service: Cardiovascular;  Laterality: N/A;   LOWER EXTREMITY ANGIOGRAPHY Right 04/03/2020   Procedure: LOWER EXTREMITY ANGIOGRAPHY;  Surgeon: Katha Cabal, MD;  Location: [place] [facility];  Service: Cardiovascular;  Laterality: Right;   OLECRANON BURSECTOMY Left 05/06/2019   Procedure: OLECRANON BURSECTOMY AND DEBRIDEMENT;  Surgeon: Leim Fabry, MD;  Location: [facility];  Service: Orthopedics;  Laterality: Left;   WOUND DEBRIDEMENT Right 06/22/2020   Procedure: DEBRIDEMENT WOUND RIGHT GROIN;  Surgeon: Algernon Huxley, MD;  Location: [facility];  Service: Vascular;  Laterality: Right;    Current Medications: No outpatient medications have been marked as taking for the 11/05/22 encounter (Appointment) with Vickie Epley, MD.     Allergies:   Chlorhexidine   Social History   Socioeconomic History   Marital status: Married    Spouse name: Not on file   Number of children: Not on file   Years of education: Not on file   Highest education level: Not on file  Occupational History   Not on file  Tobacco Use   Smoking status: Some Days    Packs/day: 2.50    Years: 54.00    Total pack years: 135.00    Types: Cigarettes   Smokeless tobacco: Never   Tobacco comments:    0.5PPD 09/17/21  Vaping Use   Vaping Use: Never used  Substance and Sexual Activity   Alcohol use: Not Currently    Comment: quit over 15 yrs ago   Drug use: Never   Sexual activity: Not on file  Other Topics Concern   Not on file  Social History Narrative   Not on file   Social Determinants of Health   Financial Resource Strain: Not on file  Food Insecurity: Not on file  Transportation Needs: Not on file  Physical Activity: Not on file  Stress: Not on file  Social Connections: Not on file     Family History: The patient's family history includes Depression in his  sister.  ROS:   Please see the history of present illness.    All other systems reviewed and are negative.  EKGs/Labs/Other Studies Reviewed:    The following studies were reviewed today:      Recent Labs: 09/12/2022: ALT 20 09/15/2022: Magnesium 2.5 09/18/2022: B Natriuretic Peptide 110.1 09/20/2022: BUN 21; Creatinine, Ser 0.96; Hemoglobin 14.5; Platelets 230; Potassium 4.5; Sodium 139  Recent Lipid Panel    Component Value Date/Time   CHOL 120 09/12/2022 0656   TRIG 96 09/12/2022 0656   HDL 56 09/12/2022 0656   CHOLHDL 2.1 09/12/2022 0656   VLDL 19 09/12/2022 0656   LDLCALC 45 09/12/2022 0656    Physical Exam:    VS:  There were no vitals taken for this visit.    Wt Readings from Last 3 Encounters:  10/14/22 159 lb (72.1 kg)  09/19/22 167 lb 1.7 oz (75.8 kg)  07/10/22 162 lb 6.4 oz (  73.7 kg)     GEN: *** Well nourished, well developed in no acute distress CARDIAC: ***RRR, no murmurs, rubs, gallops RESPIRATORY:  Clear to auscultation without rales, wheezing or rhonchi       ASSESSMENT:    1. PAF (paroxysmal atrial fibrillation) ([place])   2. Non-ST elevation (NSTEMI) myocardial infarction [facility])    PLAN:    In order of problems listed above:  #Atrial fibrillation/flutter Recurrent.  When he was admitted in December, had a rapid ventricular rate thought to be secondary to aberrantly conducted atrial flutter.  On amiodarone now in addition to Eliquis.  Not a candidate for invasive EP procedures.  Lab work October 20, 2022 with [facility] clinic showed a normal AST and ALT.  Needs TSH and free T4 today.***  Continue amiodarone 200 mg by mouth once daily  #Coronary artery disease #Peripheral vascular disease Follows with [facility] clinic, [name].    Follow-up 6 months with EP APP.   Medication Adjustments/Labs and Tests Ordered: Current medicines are reviewed at length with the patient today.  Concerns regarding medicines are outlined above.   No orders of the defined types were placed in this encounter.  No orders of the defined types were placed in this encounter.    Signed, Hilton Cork. Quentin Ore, MD, [facility], [facility] 11/04/2022 10:41 AM    Electrophysiology [facility]

## 2022-11-05 ENCOUNTER — Ambulatory Visit: Payer: BC Managed Care – PPO | Attending: Cardiology | Admitting: Cardiology

## 2022-11-05 DIAGNOSIS — I4892 Unspecified atrial flutter: Secondary | ICD-10-CM

## 2022-11-05 DIAGNOSIS — I480 Paroxysmal atrial fibrillation: Secondary | ICD-10-CM

## 2022-11-05 DIAGNOSIS — I214 Non-ST elevation (NSTEMI) myocardial infarction: Secondary | ICD-10-CM

## 2022-11-06 ENCOUNTER — Encounter: Payer: Self-pay | Admitting: Cardiology

## 2022-11-18 ENCOUNTER — Other Ambulatory Visit: Payer: Self-pay | Admitting: Neurology

## 2022-11-18 DIAGNOSIS — R251 Tremor, unspecified: Secondary | ICD-10-CM

## 2022-11-19 ENCOUNTER — Other Ambulatory Visit: Payer: Self-pay | Admitting: *Deleted

## 2022-11-19 DIAGNOSIS — Z87891 Personal history of nicotine dependence: Secondary | ICD-10-CM

## 2022-11-19 DIAGNOSIS — Z122 Encounter for screening for malignant neoplasm of respiratory organs: Secondary | ICD-10-CM

## 2022-11-23 ENCOUNTER — Ambulatory Visit: Admission: RE | Admit: 2022-11-23 | Payer: Medicare Other | Source: Ambulatory Visit

## 2022-11-29 ENCOUNTER — Ambulatory Visit
Admission: RE | Admit: 2022-11-29 | Discharge: 2022-11-29 | Disposition: A | Discharge status: Home / Self Care | Payer: Medicare Other | Source: Ambulatory Visit | Attending: Neurology | Admitting: Neurology

## 2022-11-29 DIAGNOSIS — R251 Tremor, unspecified: Secondary | ICD-10-CM | POA: Insufficient documentation

## 2022-11-29 MED ORDER — GADOBUTROL 1 MMOL/ML IV SOLN
7.0000 mL | Freq: Once | INTRAVENOUS | Status: AC | PRN
  Administered 2022-11-29: 7 mL via INTRAVENOUS

## 2022-12-27 ENCOUNTER — Emergency Department
Admission: EM | Admit: 2022-12-27 | Discharge: 2022-12-27 | Disposition: A | Discharge status: Home / Self Care | Payer: Medicare Other | Attending: Emergency Medicine | Admitting: Emergency Medicine

## 2022-12-27 ENCOUNTER — Other Ambulatory Visit: Payer: Self-pay

## 2022-12-27 ENCOUNTER — Encounter: Payer: Self-pay | Admitting: Intensive Care

## 2022-12-27 DIAGNOSIS — Z7901 Long term (current) use of anticoagulants: Secondary | ICD-10-CM | POA: Diagnosis not present

## 2022-12-27 DIAGNOSIS — J449 Chronic obstructive pulmonary disease, unspecified: Secondary | ICD-10-CM | POA: Diagnosis not present

## 2022-12-27 DIAGNOSIS — I509 Heart failure, unspecified: Secondary | ICD-10-CM | POA: Insufficient documentation

## 2022-12-27 DIAGNOSIS — N501 Vascular disorders of male genital organs: Secondary | ICD-10-CM | POA: Diagnosis present

## 2022-12-27 DIAGNOSIS — I10 Essential (primary) hypertension: Secondary | ICD-10-CM | POA: Insufficient documentation

## 2022-12-27 DIAGNOSIS — R58 Hemorrhage, not elsewhere classified: Secondary | ICD-10-CM

## 2022-12-27 LAB — CBC WITH DIFFERENTIAL/PLATELET
Abs Immature Granulocytes: 0.05 K/uL (ref 0.00–0.07)
Basophils Absolute: 0 K/uL (ref 0.0–0.1)
Basophils Relative: 0 %
Eosinophils Absolute: 0.2 K/uL (ref 0.0–0.5)
Eosinophils Relative: 2 %
HCT: 41.9 % (ref 39.0–52.0)
Hemoglobin: 13.4 g/dL (ref 13.0–17.0)
Immature Granulocytes: 1 %
Lymphocytes Relative: 8 %
Lymphs Abs: 0.9 K/uL (ref 0.7–4.0)
MCH: 30.2 pg (ref 26.0–34.0)
MCHC: 32 g/dL (ref 30.0–36.0)
MCV: 94.6 fL (ref 80.0–100.0)
Monocytes Absolute: 1.1 K/uL — ABNORMAL HIGH (ref 0.1–1.0)
Monocytes Relative: 10 %
Neutro Abs: 8.3 K/uL — ABNORMAL HIGH (ref 1.7–7.7)
Neutrophils Relative %: 79 %
Platelets: 255 K/uL (ref 150–400)
RBC: 4.43 MIL/uL (ref 4.22–5.81)
RDW: 14.4 % (ref 11.5–15.5)
WBC: 10.5 K/uL (ref 4.0–10.5)
nRBC: 0 % (ref 0.0–0.2)

## 2022-12-27 LAB — COMPREHENSIVE METABOLIC PANEL
ALT: 27 U/L (ref 0–44)
AST: 26 U/L (ref 15–41)
Albumin: 4.7 g/dL (ref 3.5–5.0)
Alkaline Phosphatase: 81 U/L (ref 38–126)
Anion gap: 9 (ref 5–15)
BUN: 28 mg/dL — ABNORMAL HIGH (ref 8–23)
CO2: 29 mmol/L (ref 22–32)
Calcium: 9 mg/dL (ref 8.9–10.3)
Chloride: 100 mmol/L (ref 98–111)
Creatinine, Ser: 1.14 mg/dL (ref 0.61–1.24)
GFR, Estimated: >60 mL/min (ref >60)
Glucose, Bld: 89 mg/dL (ref 70–99)
Potassium: 4.1 mmol/L (ref 3.5–5.1)
Sodium: 138 mmol/L (ref 135–145)
Total Bilirubin: 0.7 mg/dL (ref 0.3–1.2)
Total Protein: 7.4 g/dL (ref 6.5–8.1)

## 2022-12-27 MED ORDER — SILVER NITRATE-POT NITRATE 75-25 % EX MISC
CUTANEOUS | Status: AC
  Filled 2022-12-27: qty 10

## 2022-12-27 NOTE — ED Triage Notes (Signed)
Patient states he is bleeding in his groin area since this AM and will not stop.   Patient reports he takes eliquis and plavix  Wears oxygen as needed

## 2022-12-27 NOTE — ED Provider Notes (Addendum)
   [facility] Provider Note    Event Date/Time   First MD Initiated Contact with Patient 12/27/22 1512     (approximate)  History   Chief Complaint: Bleeding from the scrotum  HPI  Dennis Preston is a 72 y.o. male with a past medical history of alcohol use, CHF, COPD, hypertension, hyperlipidemia, NSTEMI currently on anticoagulation with Eliquis who presents to the emergency department from bleeding from the scrotum.  According to the patient he has several bumps on his scrotum and every once a while he will have some bleeding from one of the bumps.  States this morning he began bleeding, tried stopping it but was unsuccessful at home.  He applied superglue to the area but still unsuccessful so he came to the emergency department.  Patient denies any other symptoms.  Largely negative review of systems.  Physical Exam   Triage Vital Signs: ED Triage Vitals  Enc Vitals Group     BP 12/27/22 1506 (!) 165/46     Pulse Rate 12/27/22 1506 60     Resp 12/27/22 1506 20     Temp 12/27/22 1506 97.7 F (36.5 C)     Temp Source 12/27/22 1506 Oral     SpO2 12/27/22 1506 97 %     Weight 12/27/22 1507 152 lb (68.9 kg)     Height 12/27/22 1507 5\' 4"  (1.626 m)     Head Circumference --      Peak Flow --      Pain Score 12/27/22 1507 0     Pain Loc --      Pain Edu? --      Excl. in [place]? --     Most recent vital signs: Vitals:   12/27/22 1506 12/27/22 1530  BP: (!) 165/46 (!) 144/51  Pulse: 60 (!) 47  Resp: 20 17  Temp: 97.7 F (36.5 C)   SpO2: 97% 94%    General: Awake, no distress.  CV:  Good peripheral perfusion Resp:  Normal effort.   Abd:  No distention.   Other:  After removing superglue from the scrotum with alcohol, was able to see a very small pinpoint area with mild but constant bleeding.   ED Results / Procedures / Treatments   MEDICATIONS ORDERED IN ED: Medications  silver nitrate applicators [id number] % applicator (  Given [id number])      IMPRESSION / MDM / ASSESSMENT AND PLAN / ED COURSE  I reviewed the triage vital signs and the nursing notes.  Patient's presentation is most consistent with acute illness / injury with system symptoms.  Patient presents emergency department bleeding from the scrotum.  After removing the superglue that the patient had applied is able to see a very small pinpoint area that was bleeding mildly but constantly.  No other areas of bleeding identified.  Used a silver nitrate for chemical cauterization with great success on first attempt.  Cover the area with Dermabond as a precaution.  We will monitor for 30 or so minutes to ensure no rebleeding if no rebleeding I believe the patient could be safely discharged home.  Patient is also agreeable to this plan.  No other concerns.  FINAL CLINICAL IMPRESSION(S) / ED DIAGNOSES   Bleeding from scrotum  Note:  This document was prepared using Dragon voice recognition software and may include unintentional dictation errors.   Harvest Dark, MD 12/27/22 1544    Harvest Dark, MD 12/27/22 1620

## 2022-12-27 NOTE — Discharge Instructions (Signed)
Please keep the area dry for the next 48 hours after which you may get it wet but allow it to air dry.  The glue will likely peel off within the next 3 to 4 days.  Return to the emergency department for any significant bleeding or any other symptom concerning to yourself.

## 2023-01-05 ENCOUNTER — Ambulatory Visit: Payer: Medicare Other | Attending: Acute Care

## 2023-02-06 ENCOUNTER — Other Ambulatory Visit (INDEPENDENT_AMBULATORY_CARE_PROVIDER_SITE_OTHER): Payer: Self-pay | Admitting: Vascular Surgery

## 2023-02-06 DIAGNOSIS — I6523 Occlusion and stenosis of bilateral carotid arteries: Secondary | ICD-10-CM

## 2023-02-06 DIAGNOSIS — I70229 Atherosclerosis of native arteries of extremities with rest pain, unspecified extremity: Secondary | ICD-10-CM

## 2023-02-10 ENCOUNTER — Encounter (INDEPENDENT_AMBULATORY_CARE_PROVIDER_SITE_OTHER): Payer: Medicare Other

## 2023-02-10 ENCOUNTER — Ambulatory Visit (INDEPENDENT_AMBULATORY_CARE_PROVIDER_SITE_OTHER): Payer: Medicare Other | Admitting: Vascular Surgery

## 2023-02-10 ENCOUNTER — Other Ambulatory Visit: Payer: Self-pay | Admitting: Specialist

## 2023-02-10 DIAGNOSIS — J449 Chronic obstructive pulmonary disease, unspecified: Secondary | ICD-10-CM

## 2023-02-10 DIAGNOSIS — R918 Other nonspecific abnormal finding of lung field: Secondary | ICD-10-CM

## 2023-02-12 ENCOUNTER — Ambulatory Visit
Admission: RE | Admit: 2023-02-12 | Discharge: 2023-02-12 | Disposition: A | Discharge status: Home / Self Care | Payer: Medicare Other | Source: Ambulatory Visit | Attending: Specialist | Admitting: Specialist

## 2023-02-12 DIAGNOSIS — R918 Other nonspecific abnormal finding of lung field: Secondary | ICD-10-CM

## 2023-02-12 DIAGNOSIS — J449 Chronic obstructive pulmonary disease, unspecified: Secondary | ICD-10-CM | POA: Diagnosis not present

## 2023-03-02 ENCOUNTER — Encounter: Payer: Self-pay | Admitting: *Deleted

## 2023-03-02 ENCOUNTER — Emergency Department
Admission: EM | Admit: 2023-03-02 | Discharge: 2023-03-02 | Disposition: A | Discharge status: Home / Self Care | Payer: Medicare Other | Attending: Emergency Medicine | Admitting: Emergency Medicine

## 2023-03-02 ENCOUNTER — Other Ambulatory Visit: Payer: Self-pay

## 2023-03-02 DIAGNOSIS — X58XXXA Exposure to other specified factors, initial encounter: Secondary | ICD-10-CM | POA: Insufficient documentation

## 2023-03-02 DIAGNOSIS — H11422 Conjunctival edema, left eye: Secondary | ICD-10-CM | POA: Insufficient documentation

## 2023-03-02 DIAGNOSIS — S0502XA Injury of conjunctiva and corneal abrasion without foreign body, left eye, initial encounter: Secondary | ICD-10-CM | POA: Diagnosis not present

## 2023-03-02 DIAGNOSIS — S058X2A Other injuries of left eye and orbit, initial encounter: Secondary | ICD-10-CM | POA: Diagnosis present

## 2023-03-02 MED ORDER — POLYMYXIN B-TRIMETHOPRIM 10000-0.1 UNIT/ML-% OP SOLN: 2.0000 [drp] | Freq: Four times a day (QID) | OPHTHALMIC | 0 refills | Status: DC

## 2023-03-02 MED ORDER — TETRACAINE HCL 0.5 % OP SOLN
2.0000 [drp] | Freq: Once | OPHTHALMIC | Status: AC
  Administered 2023-03-02: 2 [drp] via OPHTHALMIC
  Filled 2023-03-02: qty 4

## 2023-03-02 MED ORDER — FLUORESCEIN SODIUM 1 MG OP STRP
1.0000 | ORAL_STRIP | Freq: Once | OPHTHALMIC | Status: AC
  Administered 2023-03-02: 1 via OPHTHALMIC
  Filled 2023-03-02: qty 1

## 2023-03-02 MED ORDER — KETOROLAC TROMETHAMINE 0.5 % OP SOLN: 1.0000 [drp] | Freq: Four times a day (QID) | OPHTHALMIC | 0 refills | Status: DC

## 2023-03-02 NOTE — ED Notes (Addendum)
Pt verbalizes understanding of discharge instructions. Opportunity for questioning and answers were provided. Pt discharged from ED to home with family. Pt refused discharge vital signs.    

## 2023-03-02 NOTE — ED Provider Notes (Signed)
[facility] Provider Note  Patient Contact: 8:11 PM (approximate)   History   Foreign Body in Eye   HPI  Dennis Preston is a 72 y.o. male who presents the emergency department complaining of eye irritation.  Patient has complaints of foreign body sensation in the left eye.  He states that he wears glasses but not contacts.  No visual changes the patient states that due to the tearing things appear blurry.  Patient has no drainage from the eye.  He states that today he was at a house that was being remodeled with a large sheet rock dust and mud.  He believes that he had some dust and metal in his hand when he rubbed his eye as a source of his possible foreign body.     Physical Exam   Triage Vital Signs: ED Triage Vitals  Enc Vitals Group     BP 03/02/23 1710 (!) 154/60     Pulse Rate 03/02/23 1710 72     Resp 03/02/23 1710 (!) 22     Temp 03/02/23 1710 98.2 F (36.8 C)     Temp Source 03/02/23 1710 Oral     SpO2 03/02/23 1710 93 %     Weight 03/02/23 1707 162 lb (73.5 kg)     Height 03/02/23 1707 5\' 5"  (1.651 m)     Head Circumference --      Peak Flow --      Pain Score 03/02/23 1707 0     Pain Loc --      Pain Edu? --      Excl. in [place]? --     Most recent vital signs: Vitals:   03/02/23 1710  BP: (!) 154/60  Pulse: 72  Resp: (!) 22  Temp: 98.2 F (36.8 C)  SpO2: 93%     General: Alert and in no acute distress. Eyes:  PERRL. EOMI. conjunctival erythema to the left eye.  Small ecchymosis noted in the 6/7 o'clock position.  Patient has small area of uptake in this region with staining with foreseen.  Exam was performed under tetracaine.  Funduscopic exam is reassuring.  No evidence of retained foreign body.   Cardiovascular:  Good peripheral perfusion Respiratory: Normal respiratory effort without tachypnea or retractions. Lungs CTAB.  Musculoskeletal: Full range of motion to all extremities.  Neurologic:  No gross focal neurologic  deficits are appreciated.  Skin:   No rash noted Other:   ED Results / Procedures / Treatments   Labs (all labs ordered are listed, but only abnormal results are displayed) Labs Reviewed - No data to display   EKG     RADIOLOGY    No results found.  PROCEDURES:  Critical Care performed: No  Procedures   MEDICATIONS ORDERED IN ED: Medications  fluorescein ophthalmic strip 1 strip (1 strip Left Eye Given 03/02/23 2039)  tetracaine (PONTOCAINE) 0.5 % ophthalmic solution 2 drop (2 drops Left Eye Given 03/02/23 2039)     IMPRESSION / MDM / ASSESSMENT AND PLAN / ED COURSE  I reviewed the triage vital signs and the nursing notes.                                 Differential diagnosis includes, but is not limited to, corneal abrasion, retained foreign body, chemosis   Patient's presentation is most consistent with acute presentation with potential threat to life or bodily function.  Patient's diagnosis is consistent with corneal abrasion, ecchymosis.  Patient presents the emergency department with possible foreign body sensation to the left eye.  Patient thinks that he rubbed his eye while having some sheet rock dust and mild on his hand.  It is a small corneal abrasion with underlying chemosis.  No evidence of retained foreign body.  Polytrim and Acular prescribed for the patient.  Follow-up primary care as needed.  Return precautions discussed with the patient..  Patient is given ED precautions to return to the ED for any worsening or new symptoms.     FINAL CLINICAL IMPRESSION(S) / ED DIAGNOSES   Final diagnoses:  Abrasion of left cornea, initial encounter  Chemosis of left conjunctiva     Rx / DC Orders   ED Discharge Orders          Ordered    ketorolac (ACULAR) 0.5 % ophthalmic solution  4 times daily        03/02/23 2108    trimethoprim-polymyxin b (POLYTRIM) ophthalmic solution  Every 6 hours        03/02/23 2108             Note:  This  document was prepared using Dragon voice recognition software and may include unintentional dictation errors.   Lanette Hampshire 03/02/23 2111    Corena Herter, MD 03/02/23 [phone]

## 2023-03-02 NOTE — ED Triage Notes (Addendum)
Pt has FB in left eye for 1 hour.  No known injury  Left eye is red.  Pt alert  speech clear.   Pt states he can't see eye chart to read

## 2023-03-31 ENCOUNTER — Ambulatory Visit (INDEPENDENT_AMBULATORY_CARE_PROVIDER_SITE_OTHER): Payer: Medicare Other

## 2023-03-31 ENCOUNTER — Ambulatory Visit (INDEPENDENT_AMBULATORY_CARE_PROVIDER_SITE_OTHER): Payer: Medicare Other | Admitting: Vascular Surgery

## 2023-03-31 DIAGNOSIS — I70229 Atherosclerosis of native arteries of extremities with rest pain, unspecified extremity: Secondary | ICD-10-CM | POA: Diagnosis not present

## 2023-03-31 DIAGNOSIS — I6523 Occlusion and stenosis of bilateral carotid arteries: Secondary | ICD-10-CM | POA: Diagnosis not present

## 2023-04-01 LAB — VAS US ABI WITH/WO TBI
Left ABI: 0.63
Right ABI: 0.65

## 2023-04-14 ENCOUNTER — Ambulatory Visit (INDEPENDENT_AMBULATORY_CARE_PROVIDER_SITE_OTHER): Payer: Medicare Other | Admitting: Vascular Surgery

## 2023-04-14 VITALS — BP 130/68 | HR 64 | Resp 17

## 2023-04-14 DIAGNOSIS — I6523 Occlusion and stenosis of bilateral carotid arteries: Secondary | ICD-10-CM | POA: Diagnosis not present

## 2023-04-14 DIAGNOSIS — I724 Aneurysm of artery of lower extremity: Secondary | ICD-10-CM | POA: Diagnosis not present

## 2023-04-14 DIAGNOSIS — I70213 Atherosclerosis of native arteries of extremities with intermittent claudication, bilateral legs: Secondary | ICD-10-CM

## 2023-04-14 DIAGNOSIS — I10 Essential (primary) hypertension: Secondary | ICD-10-CM

## 2023-04-14 DIAGNOSIS — E785 Hyperlipidemia, unspecified: Secondary | ICD-10-CM | POA: Diagnosis not present

## 2023-04-14 NOTE — Progress Notes (Unsigned)
MRN : [id number]  Dennis Preston is a 72 y.o. (01-05-1951) male who presents with chief complaint of  Chief Complaint  Patient presents with   Venous Insufficiency  . History of Present Illness: Patient returns today in follow up of multiple vascular issues.  He reports having fairly extensive medical issues with multiple emergency room visits and hospitalizations over the past several months.  He has noticed increasing claudication symptoms in his calves bilaterally.  This is worse on the left than the right.  He reports no open wounds or infection.  No pain that is consistent with rest pain at this time.  ABIs were recently performed earlier this month and his right ABI is 0.65 and his left ABI 0.63.  This is about a 20-30 point drop from his last study earlier this year. He is also here to follow-up his carotid disease.  He is status post right carotid endarterectomy over 2 years ago.  He has not had any focal neurologic symptoms that would indicate current cerebrovascular ischemia. Carotid duplex today demonstrates a patent right carotid endarterectomy.  He has had progression of disease on the left side with velocities that are now on the upper end of the 60 to 79% range.  Current Outpatient Medications  Medication Sig Dispense Refill   albuterol (PROVENTIL) (2.5 MG/3ML) 0.083% nebulizer solution Take 3 mLs (2.5 mg total) by nebulization every 6 (six) hours as needed for wheezing or shortness of breath. 360 mL 0   albuterol (VENTOLIN HFA) 108 (90 Base) MCG/ACT inhaler Inhale 1-2 puffs into the lungs every 6 (six) hours as needed for wheezing or shortness of breath. 8 g 0   amiodarone (PACERONE) 200 MG tablet Take 1 tablet (200 mg total) by mouth daily. 30 tablet 0   atorvastatin (LIPITOR) 80 MG tablet Take 1 tablet (80 mg total) by mouth daily at 6 PM.     clopidogrel (PLAVIX) 75 MG tablet Take 75 mg by mouth every evening.      diazepam (VALIUM) 5 MG tablet Take 1 tablet (5 mg total) by  mouth every 12 (twelve) hours as needed for anxiety. 10 tablet 0   diltiazem (CARDIZEM CD) 120 MG 24 hr capsule Take by mouth.     ELIQUIS 5 MG TABS tablet Take 5 mg by mouth 2 (two) times daily.     Fluticasone-Umeclidin-Vilant (TRELEGY ELLIPTA) 100-62.5-25 MCG/INH AEPB Inhale 1 puff into the lungs every other day.     furosemide (LASIX) 40 MG tablet Take 40 mg by mouth 2 (two) times daily.     ketorolac (ACULAR) 0.5 % ophthalmic solution Place 1 drop into the left eye 4 (four) times daily. 5 mL 0   losartan (COZAAR) 25 MG tablet Take 25 mg by mouth daily.     OXYGEN Inhale 2 L into the lungs daily as needed (activity).     propranolol (INDERAL) 40 MG tablet Take 40 mg by mouth 2 (two) times daily.     spironolactone (ALDACTONE) 25 MG tablet Take 12.5 mg by mouth daily.     trimethoprim-polymyxin b (POLYTRIM) ophthalmic solution Place 2 drops into the left eye every 6 (six) hours. 10 mL 0   No current facility-administered medications for this visit.    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, SVG-OM1, SVG-PDA, SVG-PL   Hyperlipemia    Hypertension    Leucocytosis    Liver dysfunction    Long term current use of clopidogrel    NSTEMI (non-ST elevated myocardial infarction) (HCC)    approx 2016   Peripheral artery disease (HCC)    legs   Tobacco abuse    Wears dentures    full upper and lower    Past Surgical History:  Procedure Laterality Date   APPLICATION OF WOUND VAC Right 06/22/2020   Procedure: APPLICATION OF WOUND VAC;  Surgeon: Annice Needy, MD;  Location: [facility];  Service: Vascular;  Laterality: Right;   CARDIAC CATHETERIZATION N/A 07/10/2015   Procedure:  Right/Left Heart Cath and Coronary Angiography;  Surgeon: Alwyn Pea, MD;  Location: [facility];  Service: Cardiovascular;  Laterality: N/A;   CARDIOVERSION N/A 10/14/2022   Procedure: CARDIOVERSION;  Surgeon: Alwyn Pea, MD;  Location: [facility];  Service: Cardiovascular;  Laterality: N/A;   CORONARY ARTERY BYPASS GRAFT  07/13/2015   [facility].  4 vessel   ENDARTERECTOMY Right 01/02/2021   Procedure: ENDARTERECTOMY CAROTID;  Surgeon: Annice Needy, MD;  Location: [facility];  Service: Vascular;  Laterality: Right;   ENDARTERECTOMY FEMORAL Bilateral 05/23/2020   Procedure: ENDARTERECTOMY FEMORAL BILATERAL SUPERFICIAL FEMORAL ARTERY STENTS ;  Surgeon: Renford Dills, MD;  Location: [facility];  Service: Vascular;  Laterality: Bilateral;   FALSE ANEURYSM REPAIR Left 07/22/2020   Procedure: REPAIR LEFT FEMORAL PSEUDOANUERYSM;  Surgeon: Bertram Denver, MD;  Location: [facility];  Service: Vascular;  Laterality: Left;   INSERTION OF ILIAC STENT Left 05/23/2020   Procedure: INSERTION OF ILIAC STENT;  Surgeon: Renford Dills, MD;  Location: [facility];  Service: Vascular;  Laterality: Left;   LEFT HEART CATH AND CORONARY ANGIOGRAPHY N/A 09/16/2022   Procedure: LEFT HEART CATH AND CORONARY ANGIOGRAPHY;  Surgeon: Lennette Bihari, MD;  Location: [facility];  Service: Cardiovascular;  Laterality: N/A;   LEFT HEART CATH AND CORS/GRAFTS ANGIOGRAPHY N/A 01/23/2020   Procedure: LEFT HEART CATH AND CORS/GRAFTS ANGIOGRAPHY;  Surgeon: Alwyn Pea, MD;  Location: [facility];  Service: Cardiovascular;  Laterality: N/A;   LEFT HEART CATH AND CORS/GRAFTS ANGIOGRAPHY N/A 02/18/2022   Procedure: LEFT HEART CATH AND CORS/GRAFTS ANGIOGRAPHY;  Surgeon: Armando Reichert, MD;  Location: [facility];  Service: Cardiovascular;  Laterality: N/A;   LOWER EXTREMITY ANGIOGRAPHY Right 04/03/2020   Procedure: LOWER EXTREMITY ANGIOGRAPHY;  Surgeon: Renford Dills, MD;  Location: [facility]  INVASIVE CV LAB;  Service: Cardiovascular;  Laterality: Right;   OLECRANON BURSECTOMY Left 05/06/2019   Procedure: OLECRANON BURSECTOMY AND DEBRIDEMENT;  Surgeon: Signa Kell, MD;  Location: [facility];  Service: Orthopedics;  Laterality: Left;   WOUND DEBRIDEMENT Right 06/22/2020   Procedure: DEBRIDEMENT WOUND RIGHT GROIN;  Surgeon: Annice Needy, MD;  Location: [facility];  Service: Vascular;  Laterality: Right;     Social History   Tobacco Use   Smoking status: Former    Current packs/day: 2.50    Average packs/day: 2.5 packs/day for 54.0 years (135.0 ttl pk-yrs)    Types: Cigarettes   Smokeless tobacco: Never   Tobacco comments:    0.5PPD 09/17/21  Vaping Use   Vaping status: Never Used  Substance Use Topics  Alcohol use: Not Currently    Comment: quit over 15 yrs ago   Drug use: Never      Family History  Problem Relation Age of Onset   Depression Sister   No bleeding or clotting disorder No aneurysms  Allergies  Allergen Reactions   Chlorhexidine     REVIEW OF SYSTEMS (Negative unless checked)   Constitutional: [] Weight loss  [] Fever  [] Chills Cardiac: [] Chest pain   [] Chest pressure   [] Palpitations   [] Shortness of breath when laying flat   [x] Shortness of breath at rest   [x] Shortness of breath with exertion. Vascular:  [x] Pain in legs with walking   [] Pain in legs at rest   [] Pain in legs when laying flat   [x] Claudication   [] Pain in feet when walking  [] Pain in feet at rest  [] Pain in feet when laying flat   [] History of DVT   [] Phlebitis   [] Swelling in legs   [] Varicose veins   [] Non-healing ulcers Pulmonary:   [] Uses home oxygen   [] Productive cough   [] Hemoptysis   [] Wheeze  [x] COPD   [] Asthma Neurologic:  [] Dizziness  [] Blackouts   [] Seizures   [] History of stroke   [] History of TIA  [] Aphasia   [] Temporary blindness   [] Dysphagia   [] Weakness or numbness in arms   [] Weakness or numbness in legs Musculoskeletal:  [x] Arthritis   [] Joint swelling   [x] Joint pain    [] Low back pain Hematologic:  [] Easy bruising  [] Easy bleeding   [] Hypercoagulable state   [] Anemic  [] Hepatitis Gastrointestinal:  [] Blood in stool   [] Vomiting blood  [] Gastroesophageal reflux/heartburn   [] Difficulty swallowing. Genitourinary:  [] Chronic kidney disease   [] Difficult urination  [] Frequent urination  [] Burning with urination   [] Blood in urine Skin:  [] Rashes   [] Ulcers   [] Wounds Psychological:  [] History of anxiety   []  History of major depression.  Physical Examination  BP 130/68 (BP Location: Left Arm)   Pulse 64   Resp 17  Gen:  WD/WN, NAD Head: [place]/AT, No temporalis wasting. Ear/Nose/Throat: Hearing grossly intact, nares w/o erythema or drainage Eyes: Conjunctiva clear. Sclera non-icteric Neck: Supple.  Trachea midline Pulmonary:  Good air movement, no use of accessory muscles.  Cardiac: RRR, no JVD Vascular:  Vessel Right Left  Radial Palpable Palpable                          PT 1+ Palpable Not Palpable  DP 1+ Palpable 1+ Palpable   Gastrointestinal: soft, non-tender/non-distended. No guarding/reflex.  Musculoskeletal: M/S 5/5 throughout.  No deformity or atrophy. Mild LE edema. Neurologic: Sensation grossly intact in extremities.  Symmetrical.  Speech is fluent.  Psychiatric: Judgment intact, Mood & affect appropriate for pt's clinical situation. Dermatologic: No rashes or ulcers noted.  No cellulitis or open wounds.      Labs Recent Results (from the past 2160 hour(s))  VAS [place] ABI WITH/WO TBI     Status: None   Collection Time: 03/31/23  2:03 PM  Result Value Ref Range   Right ABI .65    Left ABI .63     Radiology VAS [place] ABI WITH/WO TBI  Result Date: 04/01/2023  LOWER EXTREMITY DOPPLER STUDY Patient Name:  KELTON BULTMAN  Date of Exam:   03/31/2023 Medical Rec #: [id number]        Accession #:    [id number] Date of Birth: 01-17-1951       Patient Gender: M Patient Age:  71 years Exam Location:  [facility] Procedure:       VAS [place] ABI WITH/WO TBI Referring Phys: [name] --------------------------------------------------------------------------------  Indications: Peripheral artery disease.  Comparison Study: 02/04/2022 Performing Technologist: Debbe Bales RVS  Examination Guidelines: A complete evaluation includes at minimum, Doppler waveform signals and systolic blood pressure reading at the level of bilateral brachial, anterior tibial, and posterior tibial arteries, when vessel segments are accessible. Bilateral testing is considered an integral part of a complete examination. Photoelectric Plethysmograph (PPG) waveforms and toe systolic pressure readings are included as required and additional duplex testing as needed. Limited examinations for reoccurring indications may be performed as noted.  ABI Findings: +---------+------------------+-----+----------+--------+ Right    Rt Pressure (mmHg)IndexWaveform  Comment  +---------+------------------+-----+----------+--------+ Brachial 127                                       +---------+------------------+-----+----------+--------+ ATA      84                0.63 monophasic         +---------+------------------+-----+----------+--------+ PTA      87                0.65 monophasic         +---------+------------------+-----+----------+--------+ Great Toe51                0.38 Abnormal           +---------+------------------+-----+----------+--------+ +---------+------------------+-----+----------+-------+ Left     Lt Pressure (mmHg)IndexWaveform  Comment +---------+------------------+-----+----------+-------+ Brachial 134                                      +---------+------------------+-----+----------+-------+ ATA      72                0.54 monophasic        +---------+------------------+-----+----------+-------+ PTA      85                0.63 monophasic        +---------+------------------+-----+----------+-------+ Great  Toe57                0.43 Abnormal          +---------+------------------+-----+----------+-------+ +-------+-----------+-----------+------------+------------+ ABI/TBIToday's ABIToday's TBIPrevious ABIPrevious TBI +-------+-----------+-----------+------------+------------+ Right  .65        .38        .89         .73          +-------+-----------+-----------+------------+------------+ Left   .63        .43        .98         .95          +-------+-----------+-----------+------------+------------+ Bilateral ABIs appear decreased compared to prior study on 02/04/2022. Bilateral TBIs appear decreased compared to prior study on 02/04/2022.  Summary: Right: Resting right ankle-brachial index indicates moderate right lower extremity arterial disease. The right toe-brachial index is abnormal. Left: Resting left ankle-brachial index indicates moderate left lower extremity arterial disease. The left toe-brachial index is abnormal. *See table(s) above for measurements and observations.  Electronically signed by [name] MD on 04/01/2023 at 11:38:11 AM.    Final    VAS US CAROTID  Result Date: 04/01/2023 Carotid Arterial Duplex Study Patient Name:  Gennaro Africa  Date of  Exam:   03/31/2023 Medical Rec #: [id number]        Accession #:    [id number] Date of Birth: 1951/03/06       Patient Gender: M Patient Age:   69 years Exam Location:  [facility] Procedure:      VAS US CAROTID Referring Phys: [name] --------------------------------------------------------------------------------  Indications:       Carotid artery disease and right endarterectomy. Comparison Study:  02/04/2022; In comparison to prior study the Left ICA appears                    to be Elevated in Velocities. Performing Technologist: Debbe Bales RVS  Examination Guidelines: A complete evaluation includes B-mode imaging, spectral Doppler, color Doppler, and power Doppler as needed of all accessible portions of each  vessel. Bilateral testing is considered an integral part of a complete examination. Limited examinations for reoccurring indications may be performed as noted.  Right Carotid Findings: +---------+--------+-------+--------+---------------------------------+--------+          PSV cm/sEDV    StenosisPlaque Description               Comments                  cm/s                                                     +---------+--------+-------+--------+---------------------------------+--------+ CCA Prox 96      12             heterogenous, irregular and                                               calcific                                  +---------+--------+-------+--------+---------------------------------+--------+ CCA Mid  148     12             heterogenous, irregular and                                               calcific                                  +---------+--------+-------+--------+---------------------------------+--------+ CCA      148     12                                                       Distal                                                                    +---------+--------+-------+--------+---------------------------------+--------+  ICA Prox 27      5                                                        +---------+--------+-------+--------+---------------------------------+--------+ ICA Mid  32      7                                                        +---------+--------+-------+--------+---------------------------------+--------+ ICA      61      17                                                       Distal                                                                    +---------+--------+-------+--------+---------------------------------+--------+ ECA      138     12                                                        +---------+--------+-------+--------+---------------------------------+--------+ +----------+--------+-------+--------+-------------------+           PSV cm/sEDV cmsDescribeArm Pressure (mmHG) +----------+--------+-------+--------+-------------------+ Subclavian180                                        +----------+--------+-------+--------+-------------------+ +---------+--------+--+--------+--+ VertebralPSV cm/s43EDV cm/s11 +---------+--------+--+--------+--+  Left Carotid Findings: +---------+--------+-------+--------+---------------------------------+--------+          PSV cm/sEDV    StenosisPlaque Description               Comments                  cm/s                                                     +---------+--------+-------+--------+---------------------------------+--------+ CCA Prox 82      21                                                       +---------+--------+-------+--------+---------------------------------+--------+ CCA Mid  80      25                                                       +---------+--------+-------+--------+---------------------------------+--------+  CCA      95      18             heterogenous, irregular and               Distal                          calcific                                  +---------+--------+-------+--------+---------------------------------+--------+ ICA Prox 77      16                                                       +---------+--------+-------+--------+---------------------------------+--------+ ICA Mid  334     78                                                       +---------+--------+-------+--------+---------------------------------+--------+ ICA      261     68                                                       Distal                                                                     +---------+--------+-------+--------+---------------------------------+--------+ ECA      76      14                                                       +---------+--------+-------+--------+---------------------------------+--------+ +----------+--------+--------+--------+-------------------+           PSV cm/sEDV cm/sDescribeArm Pressure (mmHG) +----------+--------+--------+--------+-------------------+ [id number]     0                                   +----------+--------+--------+--------+-------------------+ +---------+--------+--+--------+--+ VertebralPSV cm/s45EDV cm/s10 +---------+--------+--+--------+--+   Summary: Right Carotid: Velocities in the right ICA are consistent with a 1-39% stenosis. Left Carotid: Velocities in the left ICA are consistent with a 60-79% stenosis. Vertebrals:  Bilateral vertebral arteries demonstrate antegrade flow. Subclavians: Normal flow hemodynamics were seen in bilateral subclavian              arteries. *See table(s) above for measurements and observations.  Electronically signed by [name] MD on 04/01/2023 at 11:37:46 AM.    Final     Assessment/Plan Essential hypertension blood pressure control important in reducing the progression of atherosclerotic disease. On  appropriate oral medications.     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 If we do any right lower extremity angiograms, access could be an issue.    Hyperlipidemia lipid control important in reducing the progression of atherosclerotic disease. Continue statin therapy  Atherosclerosis of native arteries of extremity with intermittent claudication (HCC) ABIs were recently performed earlier this month and his right ABI is 0.65 and his left ABI 0.63.  This is about a 20-30 point drop from his last study earlier this year.  This is consistent with his worsening claudication symptoms over the past few months.  We had a  long talk today his situation.  He is extremely complex and has had multiple previous surgery and interventional procedures.  He does not have any current limb threatening symptoms, but if his symptoms are now disabling and lifestyle limiting, consideration for angiography with possible revascularization can be performed.  This may be complicated somewhat by the left femoral pseudoaneurysm that was repaired in the past as well.  His left lower extremity is bothering him more.  After discussions with the patient and his wife, we have decided a short interval follow-up with about 3 months will be planned as long as he does not develop limb threatening symptoms.  He is urged to walk and exercise is much as possible to build collaterals.  He is on appropriate medical therapy.  Carotid atherosclerosis, bilateral Carotid duplex today demonstrates a patent right carotid endarterectomy.  He has had progression of disease on the left side with velocities that are now on the upper end of the 60 to 79% range.  He is on appropriate medical therapy.  I discussed 2 options for evaluation of this.  1 would be a CT angiogram at this time.  The other would be a shorter interval follow-up with only about 3 months with duplex.  He would prefer the latter at this time which I think is reasonable as he is asymptomatic and certainly a very high risk patient.  I will plan to see him back in 3 months with carotid duplex.    [name], MD  04/15/2023 11:47 AM    This note was created with Dragon medical transcription system.  Any errors from dictation are purely unintentional

## 2023-04-15 DIAGNOSIS — I70219 Atherosclerosis of native arteries of extremities with intermittent claudication, unspecified extremity: Secondary | ICD-10-CM | POA: Insufficient documentation

## 2023-04-15 NOTE — Assessment & Plan Note (Signed)
ABIs were recently performed earlier this month and his right ABI is 0.65 and his left ABI 0.63.  This is about a 20-30 point drop from his last study earlier this year.  This is consistent with his worsening claudication symptoms over the past few months.  We had a long talk today his situation.  He is extremely complex and has had multiple previous surgery and interventional procedures.  He does not have any current limb threatening symptoms, but if his symptoms are now disabling and lifestyle limiting, consideration for angiography with possible revascularization can be performed.  This may be complicated somewhat by the left femoral pseudoaneurysm that was repaired in the past as well.  His left lower extremity is bothering him more.  After discussions with the patient and his wife, we have decided a short interval follow-up with about 3 months will be planned as long as he does not develop limb threatening symptoms.  He is urged to walk and exercise is much as possible to build collaterals.  He is on appropriate medical therapy.

## 2023-04-15 NOTE — Assessment & Plan Note (Signed)
Carotid duplex today demonstrates a patent right carotid endarterectomy.  He has had progression of disease on the left side with velocities that are now on the upper end of the 60 to 79% range.  He is on appropriate medical therapy.  I discussed 2 options for evaluation of this.  1 would be a CT angiogram at this time.  The other would be a shorter interval follow-up with only about 3 months with duplex.  He would prefer the latter at this time which I think is reasonable as he is asymptomatic and certainly a very high risk patient.  I will plan to see him back in 3 months with carotid duplex.

## 2023-05-11 ENCOUNTER — Emergency Department
Admission: EM | Admit: 2023-05-11 | Discharge: 2023-05-11 | Disposition: A | Discharge status: Home / Self Care | Payer: Medicare Other | Source: Home / Self Care | Attending: Emergency Medicine | Admitting: Emergency Medicine

## 2023-05-11 ENCOUNTER — Other Ambulatory Visit: Payer: Self-pay

## 2023-05-11 ENCOUNTER — Emergency Department: Payer: Medicare Other

## 2023-05-11 DIAGNOSIS — J9611 Chronic respiratory failure with hypoxia: Secondary | ICD-10-CM | POA: Diagnosis not present

## 2023-05-11 DIAGNOSIS — J449 Chronic obstructive pulmonary disease, unspecified: Secondary | ICD-10-CM | POA: Diagnosis not present

## 2023-05-11 DIAGNOSIS — R0602 Shortness of breath: Secondary | ICD-10-CM | POA: Diagnosis present

## 2023-05-11 DIAGNOSIS — Z8709 Personal history of other diseases of the respiratory system: Secondary | ICD-10-CM

## 2023-05-11 DIAGNOSIS — R531 Weakness: Secondary | ICD-10-CM | POA: Insufficient documentation

## 2023-05-11 LAB — BASIC METABOLIC PANEL
Anion gap: 12 (ref 5–15)
BUN: 16 mg/dL (ref 8–23)
CO2: 30 mmol/L (ref 22–32)
Calcium: 8.9 mg/dL (ref 8.9–10.3)
Chloride: 95 mmol/L — ABNORMAL LOW (ref 98–111)
Creatinine, Ser: 1.13 mg/dL (ref 0.61–1.24)
GFR, Estimated: >60 mL/min (ref >60)
Glucose, Bld: 115 mg/dL — ABNORMAL HIGH (ref 70–99)
Potassium: 4.2 mmol/L (ref 3.5–5.1)
Sodium: 137 mmol/L (ref 135–145)

## 2023-05-11 LAB — CBC
HCT: 40.9 % (ref 39.0–52.0)
Hemoglobin: 13.4 g/dL (ref 13.0–17.0)
MCH: 29.6 pg (ref 26.0–34.0)
MCHC: 32.8 g/dL (ref 30.0–36.0)
MCV: 90.3 fL (ref 80.0–100.0)
Platelets: 336 10*3/uL (ref 150–400)
RBC: 4.53 MIL/uL (ref 4.22–5.81)
RDW: 14.8 % (ref 11.5–15.5)
WBC: 10.5 10*3/uL (ref 4.0–10.5)
nRBC: 0 % (ref 0.0–0.2)

## 2023-05-11 NOTE — ED Provider Notes (Signed)
[facility] Provider Note   Event Date/Time   First MD Initiated Contact with Patient 05/11/23 1714     (approximate) History  Shortness of Breath  HPI Dennis Preston is a 72 y.o. male with a stated past medical history of COPD on 2 L nasal cannula at night who presents complaining of intermittent lightheadedness, generalized weakness, and mild shortness of breath that occurs with exertion.  Patient states that he is normally able to walk around his house twice without having to stop to catch his breath.  Patient states when he puts his pulse ox on after this walk, it reads 92.  Patient states that he is not wearing his oxygen all the time at home as he does not feel short of breath.  Patient was found to be hypoxic in triage and placed on 2 L nasal cannula prior to my evaluation ROS: Patient currently denies any vision changes, tinnitus, difficulty speaking, facial droop, sore throat, chest pain, abdominal pain, nausea/vomiting/diarrhea, dysuria, or numbness/paresthesias in any extremity   Physical Exam  Triage Vital Signs: ED Triage Vitals [05/11/23 1450]  Encounter Vitals Group     BP (!) 145/63     Systolic BP Percentile      Diastolic BP Percentile      Pulse Rate 83     Resp 17     Temp 98 F (36.7 C)     Temp Source Oral     SpO2 95 %     Weight 164 lb (74.4 kg)     Height 5\' 7"  (1.702 m)     Head Circumference      Peak Flow      Pain Score 0     Pain Loc      Pain Education      Exclude from Growth Chart    Most recent vital signs: Vitals:   05/11/23 1900 05/11/23 1930  BP: (!) 117/50 (!) 121/49  Pulse: (!) 55 (!) 51  Resp: 20 (!) 25  Temp:  98.1 F (36.7 C)  SpO2: 94% 94%   General: Awake, oriented x4. CV:  Good peripheral perfusion.  Resp:  Normal effort.  2 L nasal cannula in place Abd:  No distention.  Other:  Elderly overweight Caucasian male laying in bed in no acute distress ED Results / Procedures / Treatments  Labs (all  labs ordered are listed, but only abnormal results are displayed) Labs Reviewed  BASIC METABOLIC PANEL - Abnormal; Notable for the following components:      Result Value   Chloride 95 (*)    Glucose, Bld 115 (*)    All other components within normal limits  CBC   EKG ED ECG REPORT I, Merwyn Katos, the attending physician, personally viewed and interpreted this ECG. Date: 05/11/2023 EKG Time: 1500 Rate: 82 Rhythm: Atrial fibrillation QRS Axis: normal Intervals: normal ST/T Wave abnormalities: normal Narrative Interpretation: Rate controlled atrial fibrillation.  No evidence of acute ischemia RADIOLOGY ED MD interpretation: 2 view chest x-ray interpreted independently by me and shows hyperinflation with no acute cardiopulmonary disease -Agree with radiology assessment Official radiology report(s): No results found. PROCEDURES: Critical Care performed: No .1-3 Lead EKG Interpretation  Performed by: Merwyn Katos, MD Authorized by: Merwyn Katos, MD     Interpretation: abnormal     ECG rate:  71   ECG rate assessment: normal     Rhythm: atrial fibrillation     Ectopy: none  Conduction: normal    MEDICATIONS ORDERED IN ED: Medications - No data to display IMPRESSION / MDM / ASSESSMENT AND PLAN / ED COURSE  I reviewed the triage vital signs and the nursing notes.                             The patient is on the cardiac monitor to evaluate for evidence of arrhythmia and/or significant heart rate changes. Patient's presentation is most consistent with acute presentation with potential threat to life or bodily function. The patient is suffering from shortness of breath, but the immediate cause is not apparent.  Potential causes considered include, but are not limited to, asthma or COPD, congestive heart failure, pulmonary embolism, pneumothorax, coronary syndrome, pneumonia, and pleural effusion.  Despite the evaluation including history, exam, and testing, the  cause of the shortness of breath remains unclear. However, during the ED stay, patient's condition improved with supplemental oxygen, and at the time of discharge the shortness of breath is resolved, they are feeling well, and want to go home.  Patient encouraged to use oxygen at all times at home instead of just at night and follow-up with his pulmonologist.  Patient will be discharged with strict return precautions and advice to follow up with primary MD within 24 hours for further evaluation.   FINAL CLINICAL IMPRESSION(S) / ED DIAGNOSES   Final diagnoses:  SOB (shortness of breath)  Chronic respiratory failure with hypoxia (HCC)  History of COPD   Rx / DC Orders   ED Discharge Orders     None      Note:  This document was prepared using Dragon voice recognition software and may include unintentional dictation errors.   Merwyn Katos, MD 05/12/23 [id number]

## 2023-05-11 NOTE — Discharge Instructions (Addendum)
Please wear 2L of O2 (oxygen) at all times until follow-up with your pulmonologist

## 2023-05-11 NOTE — ED Triage Notes (Signed)
Pt sts that he is COPD and is having problems keeping his O2 levels up. Pt sts that he is on home O2 of 2l/min via [place]. Pt VS WNL, no signs of distress at this time.

## 2023-05-11 NOTE — ED Notes (Signed)
EDP at bedside  

## 2023-06-26 ENCOUNTER — Encounter: Payer: Self-pay | Admitting: Pulmonary Disease

## 2023-07-09 ENCOUNTER — Other Ambulatory Visit (INDEPENDENT_AMBULATORY_CARE_PROVIDER_SITE_OTHER): Payer: Self-pay | Admitting: Vascular Surgery

## 2023-07-09 DIAGNOSIS — I70213 Atherosclerosis of native arteries of extremities with intermittent claudication, bilateral legs: Secondary | ICD-10-CM

## 2023-07-14 ENCOUNTER — Ambulatory Visit (INDEPENDENT_AMBULATORY_CARE_PROVIDER_SITE_OTHER): Payer: Medicare Other

## 2023-07-14 ENCOUNTER — Ambulatory Visit (INDEPENDENT_AMBULATORY_CARE_PROVIDER_SITE_OTHER): Payer: Medicare Other | Admitting: Vascular Surgery

## 2023-07-14 ENCOUNTER — Encounter (INDEPENDENT_AMBULATORY_CARE_PROVIDER_SITE_OTHER): Payer: Self-pay | Admitting: Vascular Surgery

## 2023-07-14 VITALS — BP 134/75 | HR 78 | Resp 16 | Wt 158.0 lb

## 2023-07-14 DIAGNOSIS — I6523 Occlusion and stenosis of bilateral carotid arteries: Secondary | ICD-10-CM

## 2023-07-14 DIAGNOSIS — I70213 Atherosclerosis of native arteries of extremities with intermittent claudication, bilateral legs: Secondary | ICD-10-CM

## 2023-07-14 DIAGNOSIS — I10 Essential (primary) hypertension: Secondary | ICD-10-CM

## 2023-07-14 DIAGNOSIS — I6521 Occlusion and stenosis of right carotid artery: Secondary | ICD-10-CM | POA: Diagnosis not present

## 2023-07-14 DIAGNOSIS — I724 Aneurysm of artery of lower extremity: Secondary | ICD-10-CM

## 2023-07-14 DIAGNOSIS — E785 Hyperlipidemia, unspecified: Secondary | ICD-10-CM

## 2023-07-14 NOTE — Progress Notes (Signed)
PTA      82                0.49 monophasic        +---------+------------------+-----+----------+-------+ DP       88                0.53 monophasic        +---------+------------------+-----+----------+-------+ Great Toe73                0.44                   +---------+------------------+-----+----------+-------+ +-------+-----------+-----------+------------+------------+ ABI/TBIToday's ABIToday's TBIPrevious ABIPrevious TBI +-------+-----------+-----------+------------+------------+ Right  0.57       0.48       0.65        0.38         +-------+-----------+-----------+------------+------------+ Left   0.53       0.44       0.63        0.43         +-------+-----------+-----------+------------+------------+ Right ABIs appear essentially unchanged compared to prior study on 03/31/2023. Left ABIs appear decreased compared to prior study on 03/31/2023.  Summary: Right: Resting right ankle-brachial index indicates moderate right lower extremity arterial disease. The right toe-brachial index is abnormal. Left: Resting left ankle-brachial index indicates moderate left lower extremity arterial disease. The left toe-brachial index is abnormal. *See table(s) above for measurements and observations.  Electronically signed by [name] MD on 07/15/2023 at 9:57:45 AM.    Final    VAS [place] LOWER EXTREMITY ARTERIAL DUPLEX  Result  Date: 07/15/2023 LOWER EXTREMITY ARTERIAL DUPLEX STUDY Patient Name:  Dennis Preston  Date of Exam:   07/14/2023 Medical Rec #: [id number]        Accession #:    [id number] Date of Birth: 07/23/1951       Patient Gender: M Patient Age:   72 years Exam Location:  [facility] Procedure:      VAS [place] LOWER EXTREMITY ARTERIAL DUPLEX Referring Phys: [name] --------------------------------------------------------------------------------  Indications: Claudication, and peripheral artery disease. High Risk Factors: Hypertension, past history of smoking, prior MI.  Vascular Interventions: 05/23/2020: Bilateral femoral endarterectomy. Left and                         right SFA and pop stents and left external iliac artery                         stent. Current ABI:            Right: 0.57, Left" 0.53 Performing Technologist: Hardie Lora RVT  Examination Guidelines: A complete evaluation includes B-mode imaging, spectral Doppler, color Doppler, and power Doppler as needed of all accessible portions of each vessel. Bilateral testing is considered an integral part of a complete examination. Limited examinations for reoccurring indications may be performed as noted.  +----------+--------+-----+---------------+----------+--------+ RIGHT     PSV cm/sRatioStenosis       Waveform  Comments +----------+--------+-----+---------------+----------+--------+ CFA Distal99                          triphasic          +----------+--------+-----+---------------+----------+--------+ DFA       207          50-74% stenosisbiphasic           +----------+--------+-----+---------------+----------+--------+ SFA Prox  0  MRN : [id number]  Dennis Preston is a 72 y.o. (August 12, 1951) male who presents with chief complaint of  Chief Complaint  Patient presents with   Follow-up    Ultrasound follow yup  .  History of Present Illness: Patient returns today in follow up of multiple vascular issues.  He is stable and has no new symptoms or issues from previous visits.  He does still have claudication symptoms on the left side but no lifestyle limitation, rest pain, or ulceration.  ABIs today are slightly decreased at 0.57 on the right and 0.53 on the left but the digit pressures are actually somewhat improved with digital indices of 0.48 on the right and 0.44 on the left. He is also followed for carotid disease.  He has had a right carotid endarterectomy in the past.  No recent focal neurologic symptoms. Specifically, the patient denies amaurosis fugax, speech or swallowing difficulties, or arm or leg weakness or numbness.  Duplex today shows a patent endarterectomy site with borderline 50% stenosis proximally in the CCA but this is not dramatically changed.  Left carotid stenosis remains in the 60 to 79% range.   Current Outpatient Medications  Medication Sig Dispense Refill   albuterol (PROVENTIL) (2.5 MG/3ML) 0.083% nebulizer solution Take 3 mLs (2.5 mg total) by nebulization every 6 (six) hours as needed for wheezing or shortness of breath. 360 mL 0   albuterol (VENTOLIN HFA) 108 (90 Base) MCG/ACT inhaler Inhale 1-2 puffs into the lungs every 6 (six) hours as needed for wheezing or shortness of breath. 8 g 0   amiodarone (PACERONE) 200 MG tablet Take 1 tablet (200 mg total) by mouth daily. 30 tablet 0   atorvastatin (LIPITOR) 80 MG tablet Take 1 tablet (80 mg total) by mouth daily at 6 PM.     clopidogrel (PLAVIX) 75 MG tablet Take 75 mg by mouth every evening.      diazepam (VALIUM) 5 MG tablet Take 1 tablet (5 mg total) by mouth every 12 (twelve) hours as needed for anxiety. 10 tablet 0   diltiazem (CARDIZEM  CD) 120 MG 24 hr capsule Take by mouth.     ELIQUIS 5 MG TABS tablet Take 5 mg by mouth 2 (two) times daily.     Fluticasone-Umeclidin-Vilant (TRELEGY ELLIPTA) 100-62.5-25 MCG/INH AEPB Inhale 1 puff into the lungs every other day.     furosemide (LASIX) 40 MG tablet Take 40 mg by mouth 2 (two) times daily.     ketorolac (ACULAR) 0.5 % ophthalmic solution Place 1 drop into the left eye 4 (four) times daily. 5 mL 0   losartan (COZAAR) 25 MG tablet Take 25 mg by mouth daily.     OXYGEN Inhale 2 L into the lungs daily as needed (activity).     propranolol (INDERAL) 40 MG tablet Take 40 mg by mouth 2 (two) times daily.     spironolactone (ALDACTONE) 25 MG tablet Take 12.5 mg by mouth daily.     trimethoprim-polymyxin b (POLYTRIM) ophthalmic solution Place 2 drops into the left eye every 6 (six) hours. 10 mL 0   No current facility-administered medications for this visit.    Past Medical History:  Diagnosis Date   Alcohol abuse    Amaurosis fugax    Aortic atherosclerosis (HCC)    Atrial fibrillation (HCC)    Carotid stenosis    CHF (congestive heart failure) (HCC)    Coagulopathy (HCC)    COPD (chronic obstructive pulmonary disease) (HCC)  MRN : [id number]  Dennis Preston is a 72 y.o. (August 12, 1951) male who presents with chief complaint of  Chief Complaint  Patient presents with   Follow-up    Ultrasound follow yup  .  History of Present Illness: Patient returns today in follow up of multiple vascular issues.  He is stable and has no new symptoms or issues from previous visits.  He does still have claudication symptoms on the left side but no lifestyle limitation, rest pain, or ulceration.  ABIs today are slightly decreased at 0.57 on the right and 0.53 on the left but the digit pressures are actually somewhat improved with digital indices of 0.48 on the right and 0.44 on the left. He is also followed for carotid disease.  He has had a right carotid endarterectomy in the past.  No recent focal neurologic symptoms. Specifically, the patient denies amaurosis fugax, speech or swallowing difficulties, or arm or leg weakness or numbness.  Duplex today shows a patent endarterectomy site with borderline 50% stenosis proximally in the CCA but this is not dramatically changed.  Left carotid stenosis remains in the 60 to 79% range.   Current Outpatient Medications  Medication Sig Dispense Refill   albuterol (PROVENTIL) (2.5 MG/3ML) 0.083% nebulizer solution Take 3 mLs (2.5 mg total) by nebulization every 6 (six) hours as needed for wheezing or shortness of breath. 360 mL 0   albuterol (VENTOLIN HFA) 108 (90 Base) MCG/ACT inhaler Inhale 1-2 puffs into the lungs every 6 (six) hours as needed for wheezing or shortness of breath. 8 g 0   amiodarone (PACERONE) 200 MG tablet Take 1 tablet (200 mg total) by mouth daily. 30 tablet 0   atorvastatin (LIPITOR) 80 MG tablet Take 1 tablet (80 mg total) by mouth daily at 6 PM.     clopidogrel (PLAVIX) 75 MG tablet Take 75 mg by mouth every evening.      diazepam (VALIUM) 5 MG tablet Take 1 tablet (5 mg total) by mouth every 12 (twelve) hours as needed for anxiety. 10 tablet 0   diltiazem (CARDIZEM  CD) 120 MG 24 hr capsule Take by mouth.     ELIQUIS 5 MG TABS tablet Take 5 mg by mouth 2 (two) times daily.     Fluticasone-Umeclidin-Vilant (TRELEGY ELLIPTA) 100-62.5-25 MCG/INH AEPB Inhale 1 puff into the lungs every other day.     furosemide (LASIX) 40 MG tablet Take 40 mg by mouth 2 (two) times daily.     ketorolac (ACULAR) 0.5 % ophthalmic solution Place 1 drop into the left eye 4 (four) times daily. 5 mL 0   losartan (COZAAR) 25 MG tablet Take 25 mg by mouth daily.     OXYGEN Inhale 2 L into the lungs daily as needed (activity).     propranolol (INDERAL) 40 MG tablet Take 40 mg by mouth 2 (two) times daily.     spironolactone (ALDACTONE) 25 MG tablet Take 12.5 mg by mouth daily.     trimethoprim-polymyxin b (POLYTRIM) ophthalmic solution Place 2 drops into the left eye every 6 (six) hours. 10 mL 0   No current facility-administered medications for this visit.    Past Medical History:  Diagnosis Date   Alcohol abuse    Amaurosis fugax    Aortic atherosclerosis (HCC)    Atrial fibrillation (HCC)    Carotid stenosis    CHF (congestive heart failure) (HCC)    Coagulopathy (HCC)    COPD (chronic obstructive pulmonary disease) (HCC)  MRN : [id number]  Dennis Preston is a 72 y.o. (August 12, 1951) male who presents with chief complaint of  Chief Complaint  Patient presents with   Follow-up    Ultrasound follow yup  .  History of Present Illness: Patient returns today in follow up of multiple vascular issues.  He is stable and has no new symptoms or issues from previous visits.  He does still have claudication symptoms on the left side but no lifestyle limitation, rest pain, or ulceration.  ABIs today are slightly decreased at 0.57 on the right and 0.53 on the left but the digit pressures are actually somewhat improved with digital indices of 0.48 on the right and 0.44 on the left. He is also followed for carotid disease.  He has had a right carotid endarterectomy in the past.  No recent focal neurologic symptoms. Specifically, the patient denies amaurosis fugax, speech or swallowing difficulties, or arm or leg weakness or numbness.  Duplex today shows a patent endarterectomy site with borderline 50% stenosis proximally in the CCA but this is not dramatically changed.  Left carotid stenosis remains in the 60 to 79% range.   Current Outpatient Medications  Medication Sig Dispense Refill   albuterol (PROVENTIL) (2.5 MG/3ML) 0.083% nebulizer solution Take 3 mLs (2.5 mg total) by nebulization every 6 (six) hours as needed for wheezing or shortness of breath. 360 mL 0   albuterol (VENTOLIN HFA) 108 (90 Base) MCG/ACT inhaler Inhale 1-2 puffs into the lungs every 6 (six) hours as needed for wheezing or shortness of breath. 8 g 0   amiodarone (PACERONE) 200 MG tablet Take 1 tablet (200 mg total) by mouth daily. 30 tablet 0   atorvastatin (LIPITOR) 80 MG tablet Take 1 tablet (80 mg total) by mouth daily at 6 PM.     clopidogrel (PLAVIX) 75 MG tablet Take 75 mg by mouth every evening.      diazepam (VALIUM) 5 MG tablet Take 1 tablet (5 mg total) by mouth every 12 (twelve) hours as needed for anxiety. 10 tablet 0   diltiazem (CARDIZEM  CD) 120 MG 24 hr capsule Take by mouth.     ELIQUIS 5 MG TABS tablet Take 5 mg by mouth 2 (two) times daily.     Fluticasone-Umeclidin-Vilant (TRELEGY ELLIPTA) 100-62.5-25 MCG/INH AEPB Inhale 1 puff into the lungs every other day.     furosemide (LASIX) 40 MG tablet Take 40 mg by mouth 2 (two) times daily.     ketorolac (ACULAR) 0.5 % ophthalmic solution Place 1 drop into the left eye 4 (four) times daily. 5 mL 0   losartan (COZAAR) 25 MG tablet Take 25 mg by mouth daily.     OXYGEN Inhale 2 L into the lungs daily as needed (activity).     propranolol (INDERAL) 40 MG tablet Take 40 mg by mouth 2 (two) times daily.     spironolactone (ALDACTONE) 25 MG tablet Take 12.5 mg by mouth daily.     trimethoprim-polymyxin b (POLYTRIM) ophthalmic solution Place 2 drops into the left eye every 6 (six) hours. 10 mL 0   No current facility-administered medications for this visit.    Past Medical History:  Diagnosis Date   Alcohol abuse    Amaurosis fugax    Aortic atherosclerosis (HCC)    Atrial fibrillation (HCC)    Carotid stenosis    CHF (congestive heart failure) (HCC)    Coagulopathy (HCC)    COPD (chronic obstructive pulmonary disease) (HCC)  PTA      82                0.49 monophasic        +---------+------------------+-----+----------+-------+ DP       88                0.53 monophasic        +---------+------------------+-----+----------+-------+ Great Toe73                0.44                   +---------+------------------+-----+----------+-------+ +-------+-----------+-----------+------------+------------+ ABI/TBIToday's ABIToday's TBIPrevious ABIPrevious TBI +-------+-----------+-----------+------------+------------+ Right  0.57       0.48       0.65        0.38         +-------+-----------+-----------+------------+------------+ Left   0.53       0.44       0.63        0.43         +-------+-----------+-----------+------------+------------+ Right ABIs appear essentially unchanged compared to prior study on 03/31/2023. Left ABIs appear decreased compared to prior study on 03/31/2023.  Summary: Right: Resting right ankle-brachial index indicates moderate right lower extremity arterial disease. The right toe-brachial index is abnormal. Left: Resting left ankle-brachial index indicates moderate left lower extremity arterial disease. The left toe-brachial index is abnormal. *See table(s) above for measurements and observations.  Electronically signed by [name] MD on 07/15/2023 at 9:57:45 AM.    Final    VAS [place] LOWER EXTREMITY ARTERIAL DUPLEX  Result  Date: 07/15/2023 LOWER EXTREMITY ARTERIAL DUPLEX STUDY Patient Name:  Dennis Preston  Date of Exam:   07/14/2023 Medical Rec #: [id number]        Accession #:    [id number] Date of Birth: 07/23/1951       Patient Gender: M Patient Age:   72 years Exam Location:  [facility] Procedure:      VAS [place] LOWER EXTREMITY ARTERIAL DUPLEX Referring Phys: [name] --------------------------------------------------------------------------------  Indications: Claudication, and peripheral artery disease. High Risk Factors: Hypertension, past history of smoking, prior MI.  Vascular Interventions: 05/23/2020: Bilateral femoral endarterectomy. Left and                         right SFA and pop stents and left external iliac artery                         stent. Current ABI:            Right: 0.57, Left" 0.53 Performing Technologist: Hardie Lora RVT  Examination Guidelines: A complete evaluation includes B-mode imaging, spectral Doppler, color Doppler, and power Doppler as needed of all accessible portions of each vessel. Bilateral testing is considered an integral part of a complete examination. Limited examinations for reoccurring indications may be performed as noted.  +----------+--------+-----+---------------+----------+--------+ RIGHT     PSV cm/sRatioStenosis       Waveform  Comments +----------+--------+-----+---------------+----------+--------+ CFA Distal99                          triphasic          +----------+--------+-----+---------------+----------+--------+ DFA       207          50-74% stenosisbiphasic           +----------+--------+-----+---------------+----------+--------+ SFA Prox  0  PTA      82                0.49 monophasic        +---------+------------------+-----+----------+-------+ DP       88                0.53 monophasic        +---------+------------------+-----+----------+-------+ Great Toe73                0.44                   +---------+------------------+-----+----------+-------+ +-------+-----------+-----------+------------+------------+ ABI/TBIToday's ABIToday's TBIPrevious ABIPrevious TBI +-------+-----------+-----------+------------+------------+ Right  0.57       0.48       0.65        0.38         +-------+-----------+-----------+------------+------------+ Left   0.53       0.44       0.63        0.43         +-------+-----------+-----------+------------+------------+ Right ABIs appear essentially unchanged compared to prior study on 03/31/2023. Left ABIs appear decreased compared to prior study on 03/31/2023.  Summary: Right: Resting right ankle-brachial index indicates moderate right lower extremity arterial disease. The right toe-brachial index is abnormal. Left: Resting left ankle-brachial index indicates moderate left lower extremity arterial disease. The left toe-brachial index is abnormal. *See table(s) above for measurements and observations.  Electronically signed by [name] MD on 07/15/2023 at 9:57:45 AM.    Final    VAS [place] LOWER EXTREMITY ARTERIAL DUPLEX  Result  Date: 07/15/2023 LOWER EXTREMITY ARTERIAL DUPLEX STUDY Patient Name:  Dennis Preston  Date of Exam:   07/14/2023 Medical Rec #: [id number]        Accession #:    [id number] Date of Birth: 07/23/1951       Patient Gender: M Patient Age:   72 years Exam Location:  [facility] Procedure:      VAS [place] LOWER EXTREMITY ARTERIAL DUPLEX Referring Phys: [name] --------------------------------------------------------------------------------  Indications: Claudication, and peripheral artery disease. High Risk Factors: Hypertension, past history of smoking, prior MI.  Vascular Interventions: 05/23/2020: Bilateral femoral endarterectomy. Left and                         right SFA and pop stents and left external iliac artery                         stent. Current ABI:            Right: 0.57, Left" 0.53 Performing Technologist: Hardie Lora RVT  Examination Guidelines: A complete evaluation includes B-mode imaging, spectral Doppler, color Doppler, and power Doppler as needed of all accessible portions of each vessel. Bilateral testing is considered an integral part of a complete examination. Limited examinations for reoccurring indications may be performed as noted.  +----------+--------+-----+---------------+----------+--------+ RIGHT     PSV cm/sRatioStenosis       Waveform  Comments +----------+--------+-----+---------------+----------+--------+ CFA Distal99                          triphasic          +----------+--------+-----+---------------+----------+--------+ DFA       207          50-74% stenosisbiphasic           +----------+--------+-----+---------------+----------+--------+ SFA Prox  0  MRN : [id number]  Dennis Preston is a 72 y.o. (August 12, 1951) male who presents with chief complaint of  Chief Complaint  Patient presents with   Follow-up    Ultrasound follow yup  .  History of Present Illness: Patient returns today in follow up of multiple vascular issues.  He is stable and has no new symptoms or issues from previous visits.  He does still have claudication symptoms on the left side but no lifestyle limitation, rest pain, or ulceration.  ABIs today are slightly decreased at 0.57 on the right and 0.53 on the left but the digit pressures are actually somewhat improved with digital indices of 0.48 on the right and 0.44 on the left. He is also followed for carotid disease.  He has had a right carotid endarterectomy in the past.  No recent focal neurologic symptoms. Specifically, the patient denies amaurosis fugax, speech or swallowing difficulties, or arm or leg weakness or numbness.  Duplex today shows a patent endarterectomy site with borderline 50% stenosis proximally in the CCA but this is not dramatically changed.  Left carotid stenosis remains in the 60 to 79% range.   Current Outpatient Medications  Medication Sig Dispense Refill   albuterol (PROVENTIL) (2.5 MG/3ML) 0.083% nebulizer solution Take 3 mLs (2.5 mg total) by nebulization every 6 (six) hours as needed for wheezing or shortness of breath. 360 mL 0   albuterol (VENTOLIN HFA) 108 (90 Base) MCG/ACT inhaler Inhale 1-2 puffs into the lungs every 6 (six) hours as needed for wheezing or shortness of breath. 8 g 0   amiodarone (PACERONE) 200 MG tablet Take 1 tablet (200 mg total) by mouth daily. 30 tablet 0   atorvastatin (LIPITOR) 80 MG tablet Take 1 tablet (80 mg total) by mouth daily at 6 PM.     clopidogrel (PLAVIX) 75 MG tablet Take 75 mg by mouth every evening.      diazepam (VALIUM) 5 MG tablet Take 1 tablet (5 mg total) by mouth every 12 (twelve) hours as needed for anxiety. 10 tablet 0   diltiazem (CARDIZEM  CD) 120 MG 24 hr capsule Take by mouth.     ELIQUIS 5 MG TABS tablet Take 5 mg by mouth 2 (two) times daily.     Fluticasone-Umeclidin-Vilant (TRELEGY ELLIPTA) 100-62.5-25 MCG/INH AEPB Inhale 1 puff into the lungs every other day.     furosemide (LASIX) 40 MG tablet Take 40 mg by mouth 2 (two) times daily.     ketorolac (ACULAR) 0.5 % ophthalmic solution Place 1 drop into the left eye 4 (four) times daily. 5 mL 0   losartan (COZAAR) 25 MG tablet Take 25 mg by mouth daily.     OXYGEN Inhale 2 L into the lungs daily as needed (activity).     propranolol (INDERAL) 40 MG tablet Take 40 mg by mouth 2 (two) times daily.     spironolactone (ALDACTONE) 25 MG tablet Take 12.5 mg by mouth daily.     trimethoprim-polymyxin b (POLYTRIM) ophthalmic solution Place 2 drops into the left eye every 6 (six) hours. 10 mL 0   No current facility-administered medications for this visit.    Past Medical History:  Diagnosis Date   Alcohol abuse    Amaurosis fugax    Aortic atherosclerosis (HCC)    Atrial fibrillation (HCC)    Carotid stenosis    CHF (congestive heart failure) (HCC)    Coagulopathy (HCC)    COPD (chronic obstructive pulmonary disease) (HCC)  8.9 8.9 - 10.3 mg/dL   GFR, Estimated >57 >84 mL/min     Comment: (NOTE) Calculated using the CKD-EPI Creatinine Equation (2021)    Anion gap 12 5 - 15    Comment: Performed at [facility], [address] [place], [place] [address]  CBC     Status: None   Collection Time: 05/11/23  2:53 PM  Result Value Ref Range   WBC 10.5 4.0 - 10.5 K/uL   RBC 4.53 4.22 - 5.81 MIL/uL   Hemoglobin 13.4 13.0 - 17.0 g/dL   HCT 52.8 41.3 - 24.4 %   MCV 90.3 80.0 - 100.0 fL   MCH 29.6 26.0 - 34.0 pg   MCHC 32.8 30.0 - 36.0 g/dL   RDW 01.0 27.2 - 53.6 %   Platelets 336 150 - 400 K/uL   nRBC 0.0 0.0 - 0.2 %    Comment: Performed at [facility], [address] [place], [place] [address]  VAS [place] ABI WITH/WO TBI     Status: None   Collection Time: 07/14/23  2:07 PM  Result Value Ref Range   Right ABI 0.57    Left ABI 0.53     Radiology VAS US CAROTID  Result Date: 07/15/2023 Carotid Arterial Duplex Study Patient Name:  Dennis Preston  Date of Exam:   07/14/2023 Medical Rec #: [id number]        Accession #:    [id number] Date of Birth: [birth year]       Patient Gender: M Patient Age:   89 years Exam Location:  [facility] Procedure:      VAS US CAROTID Referring Phys: [name] --------------------------------------------------------------------------------  Indications:  Carotid artery disease and right endarterectomy. Risk Factors: Hypertension, past history of smoking, prior MI. Performing Technologist: Hardie Lora RVT  Examination Guidelines: A complete evaluation includes B-mode imaging, spectral Doppler, color Doppler, and power Doppler as needed of all accessible portions of each vessel. Bilateral testing is considered an integral part of a complete examination. Limited examinations for reoccurring indications may be performed as noted.  Right Carotid Findings: +----------+--------+--------+--------+--------------------------+--------+           PSV cm/sEDV cm/sStenosisPlaque Description        Comments  +----------+--------+--------+--------+--------------------------+--------+ CCA Prox  63              >50%    irregular and heterogenous         +----------+--------+--------+--------+--------------------------+--------+ CCA Mid   173     0               irregular                          +----------+--------+--------+--------+--------------------------+--------+ CCA Distal72      0                                                  +----------+--------+--------+--------+--------------------------+--------+ ICA Prox  25      7       1-39%   smooth and homogeneous             +----------+--------+--------+--------+--------------------------+--------+ ICA Mid   70      21                                                 +----------+--------+--------+--------+--------------------------+--------+  MRN : [id number]  Dennis Preston is a 72 y.o. (August 12, 1951) male who presents with chief complaint of  Chief Complaint  Patient presents with   Follow-up    Ultrasound follow yup  .  History of Present Illness: Patient returns today in follow up of multiple vascular issues.  He is stable and has no new symptoms or issues from previous visits.  He does still have claudication symptoms on the left side but no lifestyle limitation, rest pain, or ulceration.  ABIs today are slightly decreased at 0.57 on the right and 0.53 on the left but the digit pressures are actually somewhat improved with digital indices of 0.48 on the right and 0.44 on the left. He is also followed for carotid disease.  He has had a right carotid endarterectomy in the past.  No recent focal neurologic symptoms. Specifically, the patient denies amaurosis fugax, speech or swallowing difficulties, or arm or leg weakness or numbness.  Duplex today shows a patent endarterectomy site with borderline 50% stenosis proximally in the CCA but this is not dramatically changed.  Left carotid stenosis remains in the 60 to 79% range.   Current Outpatient Medications  Medication Sig Dispense Refill   albuterol (PROVENTIL) (2.5 MG/3ML) 0.083% nebulizer solution Take 3 mLs (2.5 mg total) by nebulization every 6 (six) hours as needed for wheezing or shortness of breath. 360 mL 0   albuterol (VENTOLIN HFA) 108 (90 Base) MCG/ACT inhaler Inhale 1-2 puffs into the lungs every 6 (six) hours as needed for wheezing or shortness of breath. 8 g 0   amiodarone (PACERONE) 200 MG tablet Take 1 tablet (200 mg total) by mouth daily. 30 tablet 0   atorvastatin (LIPITOR) 80 MG tablet Take 1 tablet (80 mg total) by mouth daily at 6 PM.     clopidogrel (PLAVIX) 75 MG tablet Take 75 mg by mouth every evening.      diazepam (VALIUM) 5 MG tablet Take 1 tablet (5 mg total) by mouth every 12 (twelve) hours as needed for anxiety. 10 tablet 0   diltiazem (CARDIZEM  CD) 120 MG 24 hr capsule Take by mouth.     ELIQUIS 5 MG TABS tablet Take 5 mg by mouth 2 (two) times daily.     Fluticasone-Umeclidin-Vilant (TRELEGY ELLIPTA) 100-62.5-25 MCG/INH AEPB Inhale 1 puff into the lungs every other day.     furosemide (LASIX) 40 MG tablet Take 40 mg by mouth 2 (two) times daily.     ketorolac (ACULAR) 0.5 % ophthalmic solution Place 1 drop into the left eye 4 (four) times daily. 5 mL 0   losartan (COZAAR) 25 MG tablet Take 25 mg by mouth daily.     OXYGEN Inhale 2 L into the lungs daily as needed (activity).     propranolol (INDERAL) 40 MG tablet Take 40 mg by mouth 2 (two) times daily.     spironolactone (ALDACTONE) 25 MG tablet Take 12.5 mg by mouth daily.     trimethoprim-polymyxin b (POLYTRIM) ophthalmic solution Place 2 drops into the left eye every 6 (six) hours. 10 mL 0   No current facility-administered medications for this visit.    Past Medical History:  Diagnosis Date   Alcohol abuse    Amaurosis fugax    Aortic atherosclerosis (HCC)    Atrial fibrillation (HCC)    Carotid stenosis    CHF (congestive heart failure) (HCC)    Coagulopathy (HCC)    COPD (chronic obstructive pulmonary disease) (HCC)

## 2023-07-15 LAB — VAS US ABI WITH/WO TBI
Left ABI: 0.53
Right ABI: 0.57

## 2023-07-17 NOTE — Assessment & Plan Note (Signed)
ABIs today are slightly decreased at 0.57 on the right and 0.53 on the left but the digit pressures are actually somewhat improved with digital indices of 0.48 on the right and 0.44 on the left.  Both SFAs are occluded.  He is stable.  He has had extensive revascularization both surgical and endovascular in the past and options for further revascularization would be limited to limb threatening problems.  He does not have any current limb threatening problems.  Follow-up in 6 months with noninvasive studies.

## 2023-07-17 NOTE — Assessment & Plan Note (Signed)
Duplex today shows a patent endarterectomy site with borderline 50% stenosis proximally in the CCA but this is not dramatically changed.  Left carotid stenosis remains in the 60 to 79% range. No role for intervention.  Continue current medical therapy.  Recheck in 6 months.

## 2023-08-24 ENCOUNTER — Inpatient Hospital Stay (HOSPITAL_COMMUNITY)
Admission: EM | Admit: 2023-08-24 | Discharge: 2023-08-26 | DRG: 309 | Disposition: A | Discharge status: Home / Self Care | Payer: Medicare Other | Attending: Family Medicine | Admitting: Family Medicine

## 2023-08-24 ENCOUNTER — Encounter (HOSPITAL_COMMUNITY): Payer: Self-pay

## 2023-08-24 ENCOUNTER — Emergency Department (HOSPITAL_COMMUNITY): Payer: Medicare Other

## 2023-08-24 ENCOUNTER — Other Ambulatory Visit: Payer: Self-pay

## 2023-08-24 DIAGNOSIS — I5032 Chronic diastolic (congestive) heart failure: Secondary | ICD-10-CM | POA: Insufficient documentation

## 2023-08-24 DIAGNOSIS — R000 Tachycardia, unspecified: Secondary | ICD-10-CM

## 2023-08-24 DIAGNOSIS — Z9981 Dependence on supplemental oxygen: Secondary | ICD-10-CM

## 2023-08-24 DIAGNOSIS — Z7902 Long term (current) use of antithrombotics/antiplatelets: Secondary | ICD-10-CM

## 2023-08-24 DIAGNOSIS — I4892 Unspecified atrial flutter: Principal | ICD-10-CM | POA: Diagnosis present

## 2023-08-24 DIAGNOSIS — G4733 Obstructive sleep apnea (adult) (pediatric): Secondary | ICD-10-CM | POA: Diagnosis present

## 2023-08-24 DIAGNOSIS — J4489 Other specified chronic obstructive pulmonary disease: Secondary | ICD-10-CM | POA: Diagnosis present

## 2023-08-24 DIAGNOSIS — I2510 Atherosclerotic heart disease of native coronary artery without angina pectoris: Secondary | ICD-10-CM | POA: Diagnosis present

## 2023-08-24 DIAGNOSIS — I480 Paroxysmal atrial fibrillation: Secondary | ICD-10-CM | POA: Diagnosis present

## 2023-08-24 DIAGNOSIS — I739 Peripheral vascular disease, unspecified: Secondary | ICD-10-CM | POA: Diagnosis present

## 2023-08-24 DIAGNOSIS — Z955 Presence of coronary angioplasty implant and graft: Secondary | ICD-10-CM

## 2023-08-24 DIAGNOSIS — Z888 Allergy status to other drugs, medicaments and biological substances status: Secondary | ICD-10-CM

## 2023-08-24 DIAGNOSIS — Z79899 Other long term (current) drug therapy: Secondary | ICD-10-CM

## 2023-08-24 DIAGNOSIS — N179 Acute kidney failure, unspecified: Secondary | ICD-10-CM | POA: Diagnosis present

## 2023-08-24 DIAGNOSIS — I4510 Unspecified right bundle-branch block: Secondary | ICD-10-CM | POA: Diagnosis present

## 2023-08-24 DIAGNOSIS — F419 Anxiety disorder, unspecified: Secondary | ICD-10-CM | POA: Diagnosis present

## 2023-08-24 DIAGNOSIS — Z7901 Long term (current) use of anticoagulants: Secondary | ICD-10-CM

## 2023-08-24 DIAGNOSIS — Z87891 Personal history of nicotine dependence: Secondary | ICD-10-CM

## 2023-08-24 DIAGNOSIS — I110 Hypertensive heart disease with heart failure: Secondary | ICD-10-CM | POA: Diagnosis present

## 2023-08-24 DIAGNOSIS — E785 Hyperlipidemia, unspecified: Secondary | ICD-10-CM | POA: Diagnosis present

## 2023-08-24 DIAGNOSIS — I252 Old myocardial infarction: Secondary | ICD-10-CM

## 2023-08-24 DIAGNOSIS — I4891 Unspecified atrial fibrillation: Secondary | ICD-10-CM | POA: Diagnosis present

## 2023-08-24 DIAGNOSIS — F1010 Alcohol abuse, uncomplicated: Secondary | ICD-10-CM | POA: Diagnosis present

## 2023-08-24 DIAGNOSIS — R002 Palpitations: Secondary | ICD-10-CM | POA: Diagnosis present

## 2023-08-24 LAB — BASIC METABOLIC PANEL
Anion gap: 9 (ref 5–15)
BUN: 10 mg/dL (ref 8–23)
CO2: 29 mmol/L (ref 22–32)
Calcium: 9.1 mg/dL (ref 8.9–10.3)
Chloride: 99 mmol/L (ref 98–111)
Creatinine, Ser: 1.51 mg/dL — ABNORMAL HIGH (ref 0.61–1.24)
GFR, Estimated: 49 mL/min — ABNORMAL LOW (ref >60)
Glucose, Bld: 113 mg/dL — ABNORMAL HIGH (ref 70–99)
Potassium: 3.8 mmol/L (ref 3.5–5.1)
Sodium: 137 mmol/L (ref 135–145)

## 2023-08-24 LAB — CBC WITH DIFFERENTIAL/PLATELET
Abs Immature Granulocytes: 0.04 10*3/uL (ref 0.00–0.07)
Basophils Absolute: 0 10*3/uL (ref 0.0–0.1)
Basophils Relative: 0 %
Eosinophils Absolute: 0.1 10*3/uL (ref 0.0–0.5)
Eosinophils Relative: 1 %
HCT: 46.1 % (ref 39.0–52.0)
Hemoglobin: 14.8 g/dL (ref 13.0–17.0)
Immature Granulocytes: 0 %
Lymphocytes Relative: 7 %
Lymphs Abs: 0.7 10*3/uL (ref 0.7–4.0)
MCH: 29.1 pg (ref 26.0–34.0)
MCHC: 32.1 g/dL (ref 30.0–36.0)
MCV: 90.6 fL (ref 80.0–100.0)
Monocytes Absolute: 1 10*3/uL (ref 0.1–1.0)
Monocytes Relative: 10 %
Neutro Abs: 8.8 10*3/uL — ABNORMAL HIGH (ref 1.7–7.7)
Neutrophils Relative %: 82 %
Platelets: 210 10*3/uL (ref 150–400)
RBC: 5.09 MIL/uL (ref 4.22–5.81)
RDW: 15.6 % — ABNORMAL HIGH (ref 11.5–15.5)
WBC: 10.7 10*3/uL — ABNORMAL HIGH (ref 4.0–10.5)
nRBC: 0 % (ref 0.0–0.2)

## 2023-08-24 LAB — MAGNESIUM: Magnesium: 2 mg/dL (ref 1.7–2.4)

## 2023-08-24 MED ORDER — ATORVASTATIN CALCIUM 80 MG PO TABS
80.0000 mg | ORAL_TABLET | Freq: Every day | ORAL | Status: DC
  Administered 2023-08-24 – 2023-08-25 (×2): 80 mg via ORAL
  Filled 2023-08-24 (×2): qty 1

## 2023-08-24 MED ORDER — LACTATED RINGERS IV BOLUS
500.0000 mL | Freq: Once | INTRAVENOUS | Status: AC
  Administered 2023-08-24: 500 mL via INTRAVENOUS

## 2023-08-24 MED ORDER — UMECLIDINIUM BROMIDE 62.5 MCG/ACT IN AEPB
1.0000 | INHALATION_SPRAY | Freq: Every day | RESPIRATORY_TRACT | Status: DC
  Administered 2023-08-25 – 2023-08-26 (×2): 1 via RESPIRATORY_TRACT
  Filled 2023-08-24: qty 7

## 2023-08-24 MED ORDER — DILTIAZEM LOAD VIA INFUSION
20.0000 mg | Freq: Once | INTRAVENOUS | Status: AC
  Administered 2023-08-24: 20 mg via INTRAVENOUS
  Filled 2023-08-24: qty 20

## 2023-08-24 MED ORDER — ALBUTEROL SULFATE (2.5 MG/3ML) 0.083% IN NEBU: 2.5000 mg | INHALATION_SOLUTION | Freq: Four times a day (QID) | RESPIRATORY_TRACT | Status: DC | PRN

## 2023-08-24 MED ORDER — CLOPIDOGREL BISULFATE 75 MG PO TABS
75.0000 mg | ORAL_TABLET | Freq: Every day | ORAL | Status: DC
  Administered 2023-08-24 – 2023-08-25 (×2): 75 mg via ORAL
  Filled 2023-08-24 (×2): qty 1

## 2023-08-24 MED ORDER — DILTIAZEM HCL-DEXTROSE 125-5 MG/125ML-% IV SOLN (PREMIX)
5.0000 mg/h | INTRAVENOUS | Status: AC
  Administered 2023-08-24 – 2023-08-25 (×2): 5 mg/h via INTRAVENOUS
  Filled 2023-08-24 (×2): qty 125

## 2023-08-24 MED ORDER — DIAZEPAM 5 MG PO TABS
5.0000 mg | ORAL_TABLET | Freq: Every day | ORAL | Status: DC
  Administered 2023-08-24 – 2023-08-25 (×2): 5 mg via ORAL
  Filled 2023-08-24 (×2): qty 1

## 2023-08-24 MED ORDER — FLUTICASONE FUROATE-VILANTEROL 100-25 MCG/ACT IN AEPB
1.0000 | INHALATION_SPRAY | Freq: Every day | RESPIRATORY_TRACT | Status: DC
  Administered 2023-08-25 – 2023-08-26 (×2): 1 via RESPIRATORY_TRACT
  Filled 2023-08-24: qty 28

## 2023-08-24 MED ORDER — APIXABAN 5 MG PO TABS
5.0000 mg | ORAL_TABLET | Freq: Two times a day (BID) | ORAL | Status: DC
  Administered 2023-08-24 – 2023-08-26 (×4): 5 mg via ORAL
  Filled 2023-08-24 (×4): qty 1

## 2023-08-24 MED ORDER — POTASSIUM CHLORIDE 20 MEQ PO PACK
40.0000 meq | PACK | Freq: Two times a day (BID) | ORAL | Status: DC
  Administered 2023-08-25: 40 meq via ORAL
  Filled 2023-08-24: qty 2

## 2023-08-24 NOTE — H&P (Cosign Needed Addendum)
Hospital Admission History and Physical Service Pager: [phone]  Patient name: Dennis Preston Medical record number: [id number] Date of Birth: 1950/12/31 Age: 72 y.o. Gender: male  Primary Care Provider: Barbette Reichmann, MD Consultants: None Code Status: FULL, which was confirmed with patient at bedside Preferred Emergency Contact: Contact Information     Name Relation Home Work Mobile   Dennis Preston,Dennis Preston Spouse [phone]  [phone]      Other Contacts     Name Relation Home Work Mobile   Dennis Preston Daughter   [phone]   Dennis Preston   [phone]      Chief Complaint: Heart palpitations  Assessment and Plan: Dennis Preston is a 72 y.o. male presenting with heart palpitations and found to be in aflutter. Differential for this patient's presentation includes atrial flutter, CHF exacerbation, ACS or COPD exacerbation.  Favor symptomatic atrial flutter given patient's heart rate and presentation as well as improvement when rate controlled with diltiazem.  Given the patient is euvolemic on exam with stable CXR from last admission, CHF exacerbation less likely.  ACS unlikely given lack of ischemic findings on EKG and no chest pain, as well as symptom resolution with rate control of his A flutter.  In the absence of recent productive cough, COPD exacerbation less likely.  Etiology for atrial flutter suspected to be patient's past cardiac surgical history, though also considering possible dehydration given patient's AKI (notably not clearly prerenal, will CTM).  His home use of chewing nicotine and patches could very well be contributory too.  Will check TSH to rule out hyperthyroidism. Assessment & Plan Atrial flutter, unspecified type (HCC) Presenting in atrial flutter with variable conduction, rates up to 150, s/p diltiazem 20 mg and now on gtt 5 mg/hr. Rates have improved to the 70s with 4:1 flutter on the monitor.  Was scheduled for A Fib/Flutter cardioversion in  January but spontaneously converted prior to procedure. Given past cardiac surgical history, he is susceptible to arrhythmias. No missed doses of Eliquis.  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 Appears he was on amio in the recent past but isnt now? He is a poor historian when it comes to medications. Defer to cardiology on restarting this.  Acute kidney injury (HCC) Creatinine 1.51 (baseline ~0.9).  With BUN 10, BUN/Cr ratio is <20, which calls into question prerenal etiology.  However, no known kidney impairment and no clear etiology for intrarenal or postrenal pathology. -Hold home Lasix 80 mg BID and spironolactone 12.5 mg daily in setting of AKI -AM BMP, trend Cr Chronic heart failure with preserved ejection fraction (HCC) Last echo 09/12/2022 with EF 60-65% and G1DD.  Euvolemic on exam.  CXR without significant vascular congestion. -Hold home Lasix 80 mg BID in setting of AKI; restart as able to avoid volume overload -Daily weights, strict I&Os -Follow up TTE  Chronic and stable: Hx MI, CABG: Continue home clopidogrel 75 mg QHS HTN: Hold home spironolactone 12.5 mg daily as above Hyperlipidemia: Continue home atorvastatin 80 mg daily COPD: 2L O2 PRN w/ oxygen goal 88-92%,  Breo Ellipta and Incruse (Trelegy equivalent) 1 puff QD, albuterol neb Q6h PRN Sleep, anxiety: Continue home diazepam 5 mg QHS (reports he takes nightly, not Q12h)  FEN/GI: Heart healthy diet, KVO  peripheral IV VTE Prophylaxis: Eliquis 5 mg BID  Disposition: Cardiac-tele  History of Present Illness: Dennis Preston is a 71 y.o. male with a pertinent PMH of PAD, CAD s/p CABG, Hx NSTEMI, Afib, HFpEF, COPD on 2 L, and HTN presenting with heart palpitations.    The patient states that he was short of breath this morning feeling some heart palpitations. He thinks this has been an ongoing/recurrent issue for many weeks or even months.  He became concerned and went to the fire department to get his heart checked, which was measured at 150.  At this point, EMS was called and EKG en route to hospital showed aflutter.  The patient reports that he does use oxygen 2L O2 as needed at home and notes that he needed it this morning.  He also states that he has been needing his O2 more often than normal the past week.  At baseline over the past several months, he has been walking well from his home to his car and around the house.  However, the last week he has been becoming dyspneic with even minimal ambulation.  He sleeps in the recliner, but this is unchanged for the past year.  In the ED, EKG continued to show aflutter 2:1 and 3:1 variable conduction.  CXR unremarkable without pulmonary edema.  CBC with borderline leukocytosis and neutrophilia.  Given diltiazem 20 mg bolus followed by dlitiazem gtt.  Also received 500 mL bolus for possible dehyration given creatinine 1.51 on BMP.  FMTS consulted for admission, was in 4:1 flutter on evaluation by day team prior to patient handoff to night team.  Review Of Systems: Per HPI.  Pertinent Past Medical History: -NSTEMI ~2016 -PAD -HLD -HTN -Afib -CAD -COPD on 2L PRN -HFpEF  Remainder reviewed in history tab.  Pertinent Past Surgical History: -CABG 2016 -Cardioversion October 2023 for Vtach -Endarterectomy carotid 2022  Remainder reviewed in history tab.  Pertinent Social History: Tobacco use: Quit smoking 1 year ago, 60 pack-year Hx; does dip  and patches still Alcohol use: None Other Substance use: None Lives with wife at home  Pertinent Family History: -Some cardiac history in father, unclear  Remainder reviewed in history tab.   Important Outpatient Medications: -Atorvastatin 80 mg daily and has been -Clopidogrel 75 mg daily -Diazepam 5 mg nightly -Eliquis 5 mg twice daily -Lasix 80 mg twice daily -Spironolactone 12.5 mg daily -Xopenex every 6 hours as needed for wheezing -Oxygen 2 L as needed  Remainder reviewed in medication history.   Objective: BP (!) 141/72   Pulse 70   Temp 98 F (36.7 C) (Oral)   Resp 19   Ht 5\' 5"  (1.651 m)   Wt 73 kg   SpO2 99%   BMI 26.79 kg/m   Physical Exam: General: Adult male resting in bed, appears stated age.  NAD.  Alert and at baseline. Eyes: No scleral icterus, injections. ENTM: Mildly dry mucous membranes. Neck: No JVD. Cardiovascular: Regular rate, regular rhythm.  Atrial flutter 4:1 on telemetry.  Normal S1/2.  No murmur/rub/gallops. Respiratory: CTAB, no wheezes or crackles.  Normal work of breathing on 2 L [place].  Able to complete full sentences without dyspnea. Gastrointestinal: No TTP MSK: Moving all extremities appropriate. Extremities: Notably dry skin on BLE, no peripheral edema bilaterally.  Capillary refill  less than 2 seconds. Derm: No rashes grossly. Neuro: Alert and oriented x 4. Psych: Pleasant, appropriate.  Full range affect.  Normal attention concentration.  Labs:  CBC BMET  Recent Labs  Lab 08/24/23 1708  WBC 10.7*  HGB 14.8  HCT 46.1  PLT 210   Recent Labs  Lab 08/24/23 1708  NA 137  K 3.8  CL 99  CO2 29  BUN 10  CREATININE 1.51*  GLUCOSE 113*  CALCIUM 9.1     Pertinent additional labs: -Cr 1.51  Own interpretation of EKG: Atrial flutter with variable 2:1 and 3:1 conduction.  Incomplete RBBB.  No evidence of ischemia, though some nonspecific T wave abnormalities in lateral leads.  Imaging Studies: -CXR: Some vascular  markings unchanged from prior CXR in August.  Median sternotomy wires and CABG clips visible.  Independently reviewed and agree with radiologist's interpretation.  Dimitry Sharion Dove, MD 08/24/2023, 7:11 PM  PGY-1, [facility] Family Medicine FPTS Intern pager: [phone], text pages welcome Secure chat group [facility] Teaching Service   I have evaluated this patient along with Dr. Sharion Dove and reviewed the above note, making necessary revisions.  Dorothyann Gibbs, MD 08/24/2023, 11:00 PM PGY-3, [facility] Family Medicine

## 2023-08-24 NOTE — Assessment & Plan Note (Addendum)
Presenting in atrial flutter with variable conduction, rates up to 150, s/p diltiazem 20 mg and now on gtt 5 mg/hr. Rates have improved to the 70s with 4:1 flutter on the monitor.  Was scheduled for A Fib/Flutter cardioversion in January but spontaneously converted prior to procedure. Given past cardiac surgical history, he is susceptible to arrhythmias. No missed doses of Eliquis.  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 Appears he was on amio in the recent past but isnt now? He is a poor historian when it comes to medications. Defer to cardiology on restarting this.

## 2023-08-24 NOTE — Plan of Care (Signed)
Pt still in a flutter, currently 4:1 with HR in 70's. BP stable. Pt in no acute distress with family at bedside.  Family Medicine team will admit pt. H&P to come.

## 2023-08-24 NOTE — Hospital Course (Addendum)
Dennis Preston is a 72 y.o. year old with a history of afib/flutter who presented with palpitations and SOB and was admitted to the [facility] Medicine Teaching service for A Flutter with RVR. His hospital course is as follows:  Atrial flutter  Palpitations  Patient presented in A Flutter with variable conduction, rates up to 150. Patient received diltiazem and began diltiazem gtt with improvement in rates. EKG with aflutter, 118bpm, and incomplete RBBB. TSH WNL at 3.072.  Bradycardia to the 30s, A-fib/flutter, tachycardia to the 150s noted on telemetry. Cardiology was consulted and provided greatly appreciated recommendations. Patient improved symptomatically with HRs in the 60s, and transitioned to PO diltiazem. By time of discharge, patient *** asymptomatic with ***. Cardiology recommended the following on discharge:  - Cardizem 120 daily - Eliquis 5 BID - Atorvastatin 80 daily -Plavix 75 daily -Lasix 40 in AM and 20 in PM -Spironolactone 12.5 daily   HFpEF Patient remained euvolemic on exam through admission. CXR without significant vascular congestion. Bnp 74.1. Echo during admission with EF 55-60%.   AKI Patient with normal baseline Cr. Elevated Cr to 1.51 on arrival. Returned to baseline 1.02 by time of discharge.    Other chronic conditions were medically managed with home medications and formulary alternatives as necessary (Hx MI, HTN, HLD, COPD, Anxiety).  PCP Follow-up Recommendations: Consider weaning diazepam 5 mg nightly Patient interested in further nicotine cessation given arrhythmogenic effects of nicotine Follow up cardiology recommendations

## 2023-08-24 NOTE — ED Triage Notes (Signed)
Hx of CABG, several MI's, CHF, CAD. Pt reports shortness of breath since this AM. Pt has a monitor at home showing his HR ranging at 150s bpm. Uses O2 at home PRN. Pt reports he had to use O2 this AM. Alert and oriented x 4.

## 2023-08-24 NOTE — ED Provider Notes (Signed)
[place] EMERGENCY DEPARTMENT AT [facility] Provider Note   CSN: [id number] Arrival date & time: 08/24/23  1645     History  Chief Complaint  Patient presents with   Palpitations    Dennis Preston is a 72 y.o. male with a PMH of CABG on Eliquis and Plavix, arrhythmia, COPD, HTN, HLD who presented to the ED for palpitations.  Patient reports he started feeling palpitations this afternoon, so he drove himself to the fire station to have his heart rechecked.  They found to be high, so they called EMS, who brought the patient here.  EMS reports they found the patient to be in a flutter with a rate in the 140s.  The patient denies other complaints including chest pain, shortness of breath, vomiting, diarrhea.  States he is otherwise had a normal day this morning.  Reports he has a history of an arrhythmia that required cardioversion in the hospital last year, but does not know what the specific rhythm was.  HPI     Home Medications Prior to Admission medications   Medication Sig Start Date End Date Taking? Authorizing Provider  atorvastatin (LIPITOR) 80 MG tablet Take 1 tablet (80 mg total) by mouth daily at 6 PM. 07/10/15  Yes Sainani, Rolly Pancake, MD  clopidogrel (PLAVIX) 75 MG tablet Take 75 mg by mouth every evening.  04/18/16  Yes [provider]  colchicine 0.6 MG tablet Take 0.6 mg by mouth daily as needed (gout flare).   Yes [provider]  diazepam (VALIUM) 5 MG tablet Take 1 tablet (5 mg total) by mouth every 12 (twelve) hours as needed for anxiety. Patient taking differently: Take 5 mg by mouth See admin instructions. Take 5 mg (1 tablet) at bedtime and an additional tablet during the day as needed for anxiety. 09/20/22  Yes Tyrone Nine, MD  ELIQUIS 5 MG TABS tablet Take 5 mg by mouth 2 (two) times daily. 02/20/22  Yes [provider]  Fluticasone-Umeclidin-Vilant (TRELEGY ELLIPTA) 100-62.5-25 MCG/INH AEPB Inhale 1 puff into the lungs every  other day. 01/27/20  Yes [provider]  furosemide (LASIX) 40 MG tablet Take 40 mg by mouth 2 (two) times daily. 12/26/19  Yes [provider]  OXYGEN Inhale 2 L into the lungs at bedtime as needed (02 sats < 90).   Yes [provider]  spironolactone (ALDACTONE) 25 MG tablet Take 12.5 mg by mouth daily. 02/20/22  Yes [provider]  albuterol (PROVENTIL) (2.5 MG/3ML) 0.083% nebulizer solution Take 3 mLs (2.5 mg total) by nebulization every 6 (six) hours as needed for wheezing or shortness of breath. 02/15/20   Lurene Shadow, MD  albuterol (VENTOLIN HFA) 108 (90 Base) MCG/ACT inhaler Inhale 1-2 puffs into the lungs every 6 (six) hours as needed for wheezing or shortness of breath. 02/15/20   Lurene Shadow, MD  amiodarone (PACERONE) 200 MG tablet Take 1 tablet (200 mg total) by mouth daily. 09/20/22   Tyrone Nine, MD  diltiazem (CARDIZEM CD) 120 MG 24 hr capsule Take by mouth. 02/25/23   [provider]  losartan (COZAAR) 25 MG tablet Take 25 mg by mouth daily. 06/23/22   [provider]      Allergies    Chlorhexidine    Review of Systems   Review of Systems  Physical Exam Updated Vital Signs BP (!) 141/72   Pulse 70   Temp 98 F (36.7 C) (Oral)   Resp 19   Ht 5'  5" (1.651 m)   Wt 73 kg   SpO2 99%   BMI 26.79 kg/m  Physical Exam Constitutional:      General: He is not in acute distress.    Appearance: Normal appearance. He is not ill-appearing.  HENT:     Head: Normocephalic and atraumatic.     Nose: Nose normal.     Mouth/Throat:     Mouth: Mucous membranes are moist.     Pharynx: Oropharynx is clear.  Eyes:     Pupils: Pupils are equal, round, and reactive to light.  Cardiovascular:     Rate and Rhythm: Normal rate and regular rhythm.     Pulses: Normal pulses.     Heart sounds: Normal heart sounds. No murmur heard.    No friction rub. No gallop.  Pulmonary:     Breath sounds: Normal breath sounds. No stridor. No  wheezing, rhonchi or rales.  Abdominal:     Palpations: Abdomen is soft.     Tenderness: There is no abdominal tenderness. There is no guarding or rebound.  Musculoskeletal:     Cervical back: Normal range of motion and neck supple.     Right lower leg: No edema.     Left lower leg: No edema.  Skin:    General: Skin is warm and dry.     Capillary Refill: Capillary refill takes less than 2 seconds.  Neurological:     General: No focal deficit present.     Mental Status: He is alert.     Comments: Moving all 4 extremities equally     ED Results / Procedures / Treatments   Labs (all labs ordered are listed, but only abnormal results are displayed) Labs Reviewed  BASIC METABOLIC PANEL - Abnormal; Notable for the following components:      Result Value   Glucose, Bld 113 (*)    Creatinine, Ser 1.51 (*)    GFR, Estimated 49 (*)    All other components within normal limits  CBC WITH DIFFERENTIAL/PLATELET - Abnormal; Notable for the following components:   WBC 10.7 (*)    RDW 15.6 (*)    Neutro Abs 8.8 (*)    All other components within normal limits  MAGNESIUM    EKG EKG Interpretation Date/Time:  Monday August 24 2023 16:55:10 EST Ventricular Rate:  118 PR Interval:    QRS Duration:  114 QT Interval:  411 QTC Calculation: 610 R Axis:   91  Text Interpretation: Atrial flutter Incomplete right bundle branch block Non-specific ST-t changes Confirmed by Cathren Laine ([id number]) on 08/24/2023 4:57:51 PM  Radiology DG Chest Portable 1 View  Result Date: 08/24/2023 CLINICAL DATA:  Tachycardia EXAM: PORTABLE CHEST 1 VIEW COMPARISON:  Chest x-ray 05/11/2023 FINDINGS: Patient is status post cardiac surgery. The cardiomediastinal silhouette appears stable. The lungs are clear. There is no pleural effusion or pneumothorax. No acute fractures are seen. IMPRESSION: No active disease. Electronically Signed   By: Darliss Cheney M.D.   On: 08/24/2023 17:52    Procedures Procedures     Medications Ordered in ED Medications  diltiazem (CARDIZEM) 1 mg/mL load via infusion 20 mg (20 mg Intravenous Bolus from Bag 08/24/23 1724)    And  diltiazem (CARDIZEM) 125 mg in dextrose 5% 125 mL (1 mg/mL) infusion (5 mg/hr Intravenous New Bag/Given 08/24/23 1724)  lactated ringers bolus 500 mL (0 mLs Intravenous Stopped 08/24/23 1934)    ED Course/ Medical Decision Making/ A&P  Medical Decision Making Amount and/or Complexity of Data Reviewed Labs: ordered. Radiology: ordered.  Risk Prescription drug management. Decision regarding hospitalization.   Vital signs stable, physical exam reassuring.  EKG concerning for atrial flutter with variable conduction.  Patient ministered diltiazem bolus and initiated on a drip.  BMP with creatinine 1.51, appears elevated from baseline.  Electrolytes within normal limits.  CBC with WBC 10.7.  Etiology of patient's palpitations is likely a flutter, he may be dehydrated and that is why he is having increased arrhythmias.  Patient was discussed with the family medicine service, has agreed to admit the patient.        Final Clinical Impression(s) / ED Diagnoses Final diagnoses:  Atrial flutter, unspecified type [facility])    Rx / DC Orders ED Discharge Orders     None         Janyth Pupa, MD 08/24/23 Willaim Sheng    Cathren Laine, MD 08/24/23 2105

## 2023-08-24 NOTE — ED Notes (Signed)
ED TO INPATIENT HANDOFF REPORT  ED Nurse Name and Phone #: Melburn Popper Name/Age/Gender Dennis Preston 72 y.o. male Room/Bed: 017C/017C  Code Status   Code Status: Prior  Home/SNF/Other Home Patient oriented to: self, place, time, and situation Is this baseline? Yes   Triage Complete: Triage complete  Chief Complaint Atrial flutter [facility]) [I48.92]  Triage Note Hx of CABG, several MI's, CHF, CAD. Pt reports shortness of breath since this AM. Pt has a monitor at home showing his HR ranging at 150s bpm. Uses O2 at home PRN. Pt reports he had to use O2 this AM. Alert and oriented x 4.    Allergies Allergies  Allergen Reactions   Chlorhexidine     Level of Care/Admitting Diagnosis ED Disposition     ED Disposition  Admit   Condition  --   Comment  Hospital Area: [facility] [[id number]]  Level of Care: Telemetry Medical [[id number]]  May place patient in observation at [facility] or [facility] if equivalent level of care is available:: No  Covid Evaluation: Asymptomatic - no recent exposure (last 10 days) testing not required  Diagnosis: Atrial flutter (HCC) [427.32.ICD-9-CM]  Admitting Physician: MCDIARMID, TODD D [[id number]]  Attending Physician: MCDIARMID, TODD D [[id number]]          B Medical/Surgery History 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, SVG-OM1, SVG-PDA, SVG-PL   Hyperlipemia    Hypertension    Leucocytosis    Liver dysfunction    Long term current use of clopidogrel    NSTEMI (non-ST elevated myocardial infarction) (HCC)    approx 2016   Peripheral artery disease (HCC)    legs   Tobacco abuse    Wears dentures    full upper  and lower   Past Surgical History:  Procedure Laterality Date   APPLICATION OF WOUND VAC Right 06/22/2020   Procedure: APPLICATION OF WOUND VAC;  Surgeon: Annice Needy, MD;  Location: [facility] ORS;  Service: Vascular;  Laterality: Right;   CARDIAC CATHETERIZATION N/A 07/10/2015   Procedure: Right/Left Heart Cath and Coronary Angiography;  Surgeon: Alwyn Pea, MD;  Location: [facility] INVASIVE CV LAB;  Service: Cardiovascular;  Laterality: N/A;   CARDIOVERSION N/A 10/14/2022   Procedure: CARDIOVERSION;  Surgeon: Alwyn Pea, MD;  Location: [facility] ORS;  Service: Cardiovascular;  Laterality: N/A;   CORONARY ARTERY BYPASS GRAFT  07/13/2015   [facility].  4 vessel   ENDARTERECTOMY Right 01/02/2021   Procedure: ENDARTERECTOMY CAROTID;  Surgeon: Annice Needy, MD;  Location: [facility] ORS;  Service: Vascular;  Laterality: Right;   ENDARTERECTOMY FEMORAL Bilateral 05/23/2020   Procedure: ENDARTERECTOMY FEMORAL BILATERAL SUPERFICIAL FEMORAL ARTERY STENTS ;  Surgeon: Renford Dills, MD;  Location: [facility] ORS;  Service: Vascular;  Laterality: Bilateral;   FALSE ANEURYSM REPAIR Left 07/22/2020   Procedure: REPAIR LEFT FEMORAL PSEUDOANUERYSM;  Surgeon: Bertram Denver, MD;  Location: [facility] ORS;  Service: Vascular;  Laterality: Left;   INSERTION OF ILIAC STENT Left 05/23/2020   Procedure: INSERTION OF ILIAC STENT;  Surgeon: Renford Dills, MD;  Location: [facility] ORS;  Service: Vascular;  Laterality: Left;   LEFT HEART CATH AND CORONARY ANGIOGRAPHY N/A 09/16/2022   Procedure: LEFT HEART CATH AND CORONARY ANGIOGRAPHY;  Surgeon: Lennette Bihari, MD;  Location: [facility] INVASIVE CV LAB;  Service: Cardiovascular;  Laterality: N/A;   LEFT HEART CATH AND CORS/GRAFTS ANGIOGRAPHY N/A 01/23/2020   Procedure: LEFT HEART CATH AND CORS/GRAFTS ANGIOGRAPHY;  Surgeon: Alwyn Pea, MD;  Location: [facility] INVASIVE CV LAB;  Service: Cardiovascular;  Laterality: N/A;   LEFT HEART CATH AND CORS/GRAFTS ANGIOGRAPHY N/A 02/18/2022   Procedure:  LEFT HEART CATH AND CORS/GRAFTS ANGIOGRAPHY;  Surgeon: Armando Reichert, MD;  Location: [facility] INVASIVE CV LAB;  Service: Cardiovascular;  Laterality: N/A;   LOWER EXTREMITY ANGIOGRAPHY Right 04/03/2020   Procedure: LOWER EXTREMITY ANGIOGRAPHY;  Surgeon: Renford Dills, MD;  Location: [facility] INVASIVE CV LAB;  Service: Cardiovascular;  Laterality: Right;   OLECRANON BURSECTOMY Left 05/06/2019   Procedure: OLECRANON BURSECTOMY AND DEBRIDEMENT;  Surgeon: Signa Kell, MD;  Location: [facility] ORS;  Service: Orthopedics;  Laterality: Left;   WOUND DEBRIDEMENT Right 06/22/2020   Procedure: DEBRIDEMENT WOUND RIGHT GROIN;  Surgeon: Annice Needy, MD;  Location: [facility] ORS;  Service: Vascular;  Laterality: Right;     A IV Location/Drains/Wounds Patient Lines/Drains/Airways Status     Active Line/Drains/Airways     Name Placement date Placement time Site Days   Peripheral IV 08/24/23 18 G Left Antecubital 08/24/23  1703  Antecubital  less than 1   Peripheral IV 08/24/23 18 G Right Antecubital 08/24/23  1703  Antecubital  less than 1            Intake/Output Last 24 hours No intake or output data in the 24 hours ending 08/24/23 1857  Labs/Imaging Results for orders placed or performed during the hospital encounter of 08/24/23 (from the past 48 hour(s))  Magnesium     Status: None   Collection Time: 08/24/23  5:08 PM  Result Value Ref Range   Magnesium 2.0 1.7 - 2.4 mg/dL    Comment: Performed at [facility], [address], [place], [place] [address]  Basic metabolic panel     Status: Abnormal   Collection Time: 08/24/23  5:08 PM  Result Value Ref Range   Sodium 137 135 - 145 mmol/L   Potassium 3.8 3.5 - 5.1 mmol/L   Chloride 99 98 - 111 mmol/L   CO2 29 22 - 32 mmol/L   Glucose, Bld 113 (H) 70 - 99 mg/dL    Comment: Glucose reference range applies only to samples taken after fasting for at least 8 hours.   BUN 10 8 - 23 mg/dL   Creatinine, Ser 3.47 (H) 0.61 - 1.24 mg/dL   Calcium 9.1  8.9 - 42.5 mg/dL   GFR, Estimated 49 (L) >60 mL/min    Comment: (NOTE) Calculated using the CKD-EPI Creatinine Equation (2021)    Anion gap 9 5 - 15    Comment: Performed at [facility], [address] [place], [place] [address]  CBC with Differential     Status: Abnormal   Collection Time: 08/24/23  5:08 PM  Result Value Ref Range   WBC 10.7 (H) 4.0 - 10.5 K/uL   RBC 5.09 4.22 - 5.81 MIL/uL   Hemoglobin 14.8 13.0 - 17.0 g/dL   HCT 75.6 43.3 - 29.5 %   MCV 90.6 80.0 - 100.0 fL   MCH 29.1 26.0 - 34.0 pg   MCHC 32.1 30.0 - 36.0 g/dL   RDW 18.8 (H) 41.6 -  15.5 %   Platelets 210 150 - 400 K/uL   nRBC 0.0 0.0 - 0.2 %   Neutrophils Relative % 82 %   Neutro Abs 8.8 (H) 1.7 - 7.7 K/uL   Lymphocytes Relative 7 %   Lymphs Abs 0.7 0.7 - 4.0 K/uL   Monocytes Relative 10 %   Monocytes Absolute 1.0 0.1 - 1.0 K/uL   Eosinophils Relative 1 %   Eosinophils Absolute 0.1 0.0 - 0.5 K/uL   Basophils Relative 0 %   Basophils Absolute 0.0 0.0 - 0.1 K/uL   Immature Granulocytes 0 %   Abs Immature Granulocytes 0.04 0.00 - 0.07 K/uL    Comment: Performed at [facility], [address] [place], [place] [address]   DG Chest Portable 1 View  Result Date: 08/24/2023 CLINICAL DATA:  Tachycardia EXAM: PORTABLE CHEST 1 VIEW COMPARISON:  Chest x-ray 05/11/2023 FINDINGS: Patient is status post cardiac surgery. The cardiomediastinal silhouette appears stable. The lungs are clear. There is no pleural effusion or pneumothorax. No acute fractures are seen. IMPRESSION: No active disease. Electronically Signed   By: Darliss Cheney M.D.   On: 08/24/2023 17:52    Pending Labs Unresulted Labs (From admission, onward)    None       Vitals/Pain Today's Vitals   08/24/23 1730 08/24/23 1800 08/24/23 1830 08/24/23 1845  BP: 135/89 120/64 135/68 (!) 141/72  Pulse: (!) 47 71 62 70  Resp: (!) 21 (!) 21 20 19   Temp:      TempSrc:      SpO2: 96% 97% 98% 99%  Weight:      Height:      PainSc:         Isolation Precautions No active isolations  Medications Medications  diltiazem (CARDIZEM) 1 mg/mL load via infusion 20 mg (20 mg Intravenous Bolus from Bag 08/24/23 1724)    And  diltiazem (CARDIZEM) 125 mg in dextrose 5% 125 mL (1 mg/mL) infusion (5 mg/hr Intravenous New Bag/Given 08/24/23 1724)  lactated ringers bolus 500 mL (500 mLs Intravenous New Bag/Given 08/24/23 1817)    Mobility walks     Focused Assessments    R Recommendations: See Admitting Provider Note  Report given to:   Additional Notes:

## 2023-08-25 ENCOUNTER — Observation Stay (HOSPITAL_BASED_OUTPATIENT_CLINIC_OR_DEPARTMENT_OTHER): Payer: Medicare Other

## 2023-08-25 DIAGNOSIS — I5032 Chronic diastolic (congestive) heart failure: Secondary | ICD-10-CM | POA: Insufficient documentation

## 2023-08-25 DIAGNOSIS — Z9981 Dependence on supplemental oxygen: Secondary | ICD-10-CM

## 2023-08-25 DIAGNOSIS — Z7902 Long term (current) use of antithrombotics/antiplatelets: Secondary | ICD-10-CM | POA: Diagnosis not present

## 2023-08-25 DIAGNOSIS — I4891 Unspecified atrial fibrillation: Secondary | ICD-10-CM | POA: Diagnosis present

## 2023-08-25 DIAGNOSIS — I2583 Coronary atherosclerosis due to lipid rich plaque: Secondary | ICD-10-CM

## 2023-08-25 DIAGNOSIS — N179 Acute kidney failure, unspecified: Secondary | ICD-10-CM | POA: Diagnosis present

## 2023-08-25 DIAGNOSIS — F1010 Alcohol abuse, uncomplicated: Secondary | ICD-10-CM | POA: Diagnosis present

## 2023-08-25 DIAGNOSIS — I4892 Unspecified atrial flutter: Secondary | ICD-10-CM | POA: Diagnosis present

## 2023-08-25 DIAGNOSIS — I252 Old myocardial infarction: Secondary | ICD-10-CM | POA: Diagnosis not present

## 2023-08-25 DIAGNOSIS — I480 Paroxysmal atrial fibrillation: Secondary | ICD-10-CM | POA: Diagnosis present

## 2023-08-25 DIAGNOSIS — I2510 Atherosclerotic heart disease of native coronary artery without angina pectoris: Secondary | ICD-10-CM | POA: Diagnosis present

## 2023-08-25 DIAGNOSIS — I4510 Unspecified right bundle-branch block: Secondary | ICD-10-CM | POA: Diagnosis present

## 2023-08-25 DIAGNOSIS — Z888 Allergy status to other drugs, medicaments and biological substances status: Secondary | ICD-10-CM | POA: Diagnosis not present

## 2023-08-25 DIAGNOSIS — R0609 Other forms of dyspnea: Secondary | ICD-10-CM

## 2023-08-25 DIAGNOSIS — R002 Palpitations: Secondary | ICD-10-CM | POA: Diagnosis present

## 2023-08-25 DIAGNOSIS — I110 Hypertensive heart disease with heart failure: Secondary | ICD-10-CM | POA: Diagnosis present

## 2023-08-25 DIAGNOSIS — G4733 Obstructive sleep apnea (adult) (pediatric): Secondary | ICD-10-CM | POA: Diagnosis present

## 2023-08-25 DIAGNOSIS — F419 Anxiety disorder, unspecified: Secondary | ICD-10-CM | POA: Diagnosis present

## 2023-08-25 DIAGNOSIS — R000 Tachycardia, unspecified: Secondary | ICD-10-CM

## 2023-08-25 DIAGNOSIS — Z955 Presence of coronary angioplasty implant and graft: Secondary | ICD-10-CM | POA: Diagnosis not present

## 2023-08-25 DIAGNOSIS — J4489 Other specified chronic obstructive pulmonary disease: Secondary | ICD-10-CM | POA: Diagnosis present

## 2023-08-25 DIAGNOSIS — Z7901 Long term (current) use of anticoagulants: Secondary | ICD-10-CM | POA: Diagnosis not present

## 2023-08-25 DIAGNOSIS — Z87891 Personal history of nicotine dependence: Secondary | ICD-10-CM | POA: Diagnosis not present

## 2023-08-25 DIAGNOSIS — I483 Typical atrial flutter: Secondary | ICD-10-CM

## 2023-08-25 DIAGNOSIS — I739 Peripheral vascular disease, unspecified: Secondary | ICD-10-CM | POA: Diagnosis present

## 2023-08-25 DIAGNOSIS — E785 Hyperlipidemia, unspecified: Secondary | ICD-10-CM | POA: Diagnosis present

## 2023-08-25 DIAGNOSIS — Z79899 Other long term (current) drug therapy: Secondary | ICD-10-CM | POA: Diagnosis not present

## 2023-08-25 HISTORY — DX: Acute kidney failure, unspecified: N17.9

## 2023-08-25 LAB — BASIC METABOLIC PANEL
Anion gap: 9 (ref 5–15)
BUN: 11 mg/dL (ref 8–23)
CO2: 27 mmol/L (ref 22–32)
Calcium: 8.8 mg/dL — ABNORMAL LOW (ref 8.9–10.3)
Chloride: 102 mmol/L (ref 98–111)
Creatinine, Ser: 1.02 mg/dL (ref 0.61–1.24)
GFR, Estimated: >60 mL/min (ref >60)
Glucose, Bld: 156 mg/dL — ABNORMAL HIGH (ref 70–99)
Potassium: 3.6 mmol/L (ref 3.5–5.1)
Sodium: 138 mmol/L (ref 135–145)

## 2023-08-25 LAB — ECHOCARDIOGRAM COMPLETE
AR max vel: 2.54 cm2
AV Area VTI: 2.46 cm2
AV Area mean vel: 2.28 cm2
AV Mean grad: 3 mm[Hg]
AV Peak grad: 5.7 mm[Hg]
Ao pk vel: 1.19 m/s
Area-P 1/2: 5.52 cm2
Calc EF: 58.8 %
Height: 65 in
S' Lateral: 3.8 cm
Single Plane A2C EF: 59.6 %
Single Plane A4C EF: 56.8 %
Weight: 2567.92 [oz_av]

## 2023-08-25 LAB — MAGNESIUM: Magnesium: 1.9 mg/dL (ref 1.7–2.4)

## 2023-08-25 LAB — BRAIN NATRIURETIC PEPTIDE: B Natriuretic Peptide: 74.1 pg/mL (ref 0.0–100.0)

## 2023-08-25 LAB — TSH: TSH: 3.072 u[IU]/mL (ref 0.350–4.500)

## 2023-08-25 MED ORDER — POTASSIUM CHLORIDE 20 MEQ PO PACK
40.0000 meq | PACK | Freq: Two times a day (BID) | ORAL | Status: AC
  Administered 2023-08-25 (×2): 40 meq via ORAL
  Filled 2023-08-25 (×2): qty 2

## 2023-08-25 MED ORDER — DILTIAZEM HCL ER COATED BEADS 120 MG PO CP24: 120.0000 mg | ORAL_CAPSULE | Freq: Every day | ORAL | Status: DC

## 2023-08-25 MED ORDER — DILTIAZEM HCL ER COATED BEADS 120 MG PO CP24
120.0000 mg | ORAL_CAPSULE | Freq: Every day | ORAL | Status: DC
  Administered 2023-08-25 – 2023-08-26 (×2): 120 mg via ORAL
  Filled 2023-08-25 (×2): qty 1

## 2023-08-25 NOTE — Assessment & Plan Note (Signed)
Last echo 09/12/2022 with EF 60-65% and G1DD.  Euvolemic on exam.  CXR without significant vascular congestion. -Hold home Lasix 80 mg BID in setting of AKI; restart as able to avoid volume overload -Daily weights, strict I&Os -Follow up TTE

## 2023-08-25 NOTE — Plan of Care (Signed)
  Problem: Education: Goal: Knowledge of General Education information will improve Description: Including pain rating scale, medication(s)/side effects and non-pharmacologic comfort measures Outcome: Progressing   Problem: Health Behavior/Discharge Planning: Goal: Ability to manage health-related needs will improve Outcome: Progressing   Problem: Clinical Measurements: Goal: Ability to maintain clinical measurements within normal limits will improve Outcome: Progressing Goal: Will remain free from infection Outcome: Progressing Goal: Diagnostic test results will improve Outcome: Progressing Goal: Respiratory complications will improve Outcome: Progressing Goal: Cardiovascular complication will be avoided Outcome: Progressing   Problem: Activity: Goal: Risk for activity intolerance will decrease Outcome: Progressing   Problem: Nutrition: Goal: Adequate nutrition will be maintained Outcome: Progressing   Problem: Coping: Goal: Level of anxiety will decrease Outcome: Progressing   Problem: Elimination: Goal: Will not experience complications related to bowel motility Outcome: Progressing Goal: Will not experience complications related to urinary retention Outcome: Progressing   Problem: Pain Management: Goal: General experience of comfort will improve Outcome: Progressing   Problem: Safety: Goal: Ability to remain free from injury will improve Outcome: Progressing   Problem: Skin Integrity: Goal: Risk for impaired skin integrity will decrease Outcome: Progressing   Problem: Education: Goal: Knowledge of disease or condition will improve Outcome: Progressing Goal: Understanding of medication regimen will improve Outcome: Progressing   Problem: Cardiac: Goal: Ability to achieve and maintain adequate cardiopulmonary perfusion will improve Outcome: Progressing   Problem: Health Behavior/Discharge Planning: Goal: Ability to safely manage health-related needs  after discharge will improve Outcome: Progressing

## 2023-08-25 NOTE — Progress Notes (Signed)
  Echocardiogram 2D Echocardiogram has been performed.  Dennis Preston 08/25/2023, 10:32 AM

## 2023-08-25 NOTE — Assessment & Plan Note (Addendum)
Creatinine 1.51 on arrival (baseline ~0.9), now resolved at 1.02.  - Continue to hold home lasix 80 BID and spironolactone 12.5 daily given soft BPs - AM BMP, trend Cr

## 2023-08-25 NOTE — Progress Notes (Signed)
Daily Progress Note Intern Pager: [phone]  Patient name: Dennis Preston Medical record number: [id number] Date of birth: [birth year] Age: 72 y.o. Gender: male  Primary Care Provider: Barbette Reichmann, MD Consultants: Cardiology (HF) Code Status: Full  Pt Overview and Major Events to Date:  11/25: Admitted   Assessment and Plan:  Dennis Preston is a 72 y.o. male with hx of HFpEF, Afib, Nstemi presenting with heart palpitations and found to be in aflutter. Cardiology on board.  Assessment & Plan Atrial flutter (HCC) Presenting in atrial flutter with variable conduction, rates up to 150, s/p diltiazem 20 mg and now on gtt 5 mg/hr. Rates have improved to the 70s with 4:1 flutter on the monitor.  Was scheduled for A Fib/Flutter cardioversion in January but spontaneously converted prior to procedure. Given past cardiac surgical history, he is susceptible to arrhythmias. No missed doses of Eliquis. TSH WNL at 3.072.  Bradycardia to the 30s, A-fib/flutter, tachycardia to the 150s noted on telemetry overnight.  Cardiology has been consulted. 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 Chronic heart failure with preserved ejection fraction (HCC) Last echo 09/12/2022 with EF 60-65% and G1DD.  Euvolemic on exam.  CXR without significant vascular congestion. Echo ordered.  -Follow up cardiology recs:  -BNP today  -restart home spironolactone 12.5 daily -Continue to hold home lasix 80 BID and spironolactone 12.5 daily given soft BPs-Daily weights, strict  I&Os -Follow up echo AKI (acute kidney injury) (HCC) Creatinine 1.51 on arrival (baseline ~0.9), now resolved at 1.02.  - Continue to hold home lasix 80 BID and spironolactone 12.5 daily given soft BPs - AM BMP, trend Cr  Chronic and Stable Issues: Hx MI, CABG: Continue home clopidogrel 75 mg QHS HTN: Restart home spironolactone 12.5 mg daily per cards Hyperlipidemia: Continue home atorvastatin 80 mg daily COPD: 2L O2 PRN w/ oxygen goal 88-92%, Breo Ellipta and Incruse (Trelegy equivalent) 1 puff QD, albuterol neb Q6h PRN Sleep, anxiety: Continue home diazepam 5 mg QHS (reports he takes nightly, not Q12h)   FEN/GI: Heart healthy diet, KVO peripheral IV VTE Prophylaxis: Eliquis 5 mg BID Dispo: Home pending clinical improvement   Subjective:  Patient feeling well this morning.  No chest pain, palpitations, or difficulty breathing overnight.  No headache or new vision changes.  Tolerating p.o. without issue.  Objective: Temp:  [98 F (36.7 C)-98.1 F (36.7 C)] 98.1 F (36.7 C) (11/26 0256) Pulse Rate:  [47-141] 69 (11/26 0256) Resp:  [17-23] 19 (11/26 0749) BP: (96-141)/(54-89) 96/61 (11/26 0256) SpO2:  [91 %-99 %] 98 % (11/26 0256) Weight:  [72.8 kg-73 kg] 72.8 kg (11/26 0255) Physical Exam: General: Resting comfortably.  No acute distress. Cardiovascular: Faint heart sounds. Intermittent irregular rate and rhythm.  Warm and well-perfused. Respiratory: Breathing comfortably on 1.5 L O2.  Diminished breath sounds throughout.  No crackles or wheezes appreciated.  No increased WOB. Abdomen: Bowel sounds present.  Soft.  Nontender.  Nondistended. Extremities: No lower extremity swelling or tenderness.  2-3+ distal pulses.  Laboratory: Most recent CBC Lab Results  Component Value Date   WBC 10.7 (H) 08/24/2023   HGB 14.8 08/24/2023   HCT  46.1 08/24/2023   MCV 90.6 08/24/2023   PLT 210 08/24/2023   Most recent BMP    Latest Ref Rng & Units 08/25/2023    3:29 AM  BMP   Glucose 70 - 99 mg/dL 161   BUN 8 - 23 mg/dL 11   Creatinine 0.96 - 1.24 mg/dL 0.45   Sodium 409 - 811 mmol/L 138   Potassium 3.5 - 5.1 mmol/L 3.6   Chloride 98 - 111 mmol/L 102   CO2 22 - 32 mmol/L 27   Calcium 8.9 - 10.3 mg/dL 8.8    Dennis Quale, MD 08/25/2023, 7:51 AM  PGY-1,  Family Medicine FPTS Intern pager: [phone], text pages welcome Secure chat group [facility] Teaching Service

## 2023-08-25 NOTE — Consult Note (Signed)
Cardiology Consultation   Patient ID: Dennis Preston MRN: [id number]; DOB: 1951-02-15  Admit date: 08/24/2023 Date of Consult: 08/25/2023  PCP:  Barbette Reichmann, MD   [facility] Providers Cardiologist:  None  Electrophysiologist:  Lanier Prude, MD  {    Patient Profile:   Dennis Preston is a 72 y.o. male with a hx of multivessel CAD s/p CABGx4 , HFpEF, oxygen dependent COPD, HTN, PAD status post left subclavian stent 2023 at [facility], carotid stenosis status post CEA, HLD, paroxysmal A fib, wide complex tachycardia/SVT,  ETOH abuse, tobacco abuse, OSA, anxiety, who is being seen 08/25/2023 for the evaluation of atrial flutter with RVR at the request of Dr Erlinda Hong Diarmid.  History of Present Illness:   Dennis Preston with above past medical history presented to the ER yesterday complaining heart palpitation.  He started having heart palpitation 08/24/2023 afternoon, was also feeling increasingly short of breath. He drove himself to the fire station, was told his heart rate is elevated.  EMS was called, reported patient was in atrial flutter with heart rate of 140-150s, transferred the patient to [facility].  Upon arrival, patient reported that he has been eating oxygen more often for respiratory support over the past week, supposed to be on as needed oxygen at baseline for COPD.  He noted increased shortness of breath with minimal exertion over the past week.   Upon encounter, patient states he is not clear what heart palpitation is.  He recalls having significant shortness of breath at rest yesterday.  He reports that he has been feeling well over the past year until yesterday.  He states his shortness of breath is not similar or as severe comparing to what he had 08/2022 hospitalization.  He does not monitor his atrial fibrillation at home, not aware of any typical symptoms, had occasional transient atypical chest pain or shortness of breath that resolved spontaneously at baseline.  He  states shortness of breath was significant and not resolving yesterday therefore he seek medical help.  He has been compliant with his cardiac medication, admits that possibly skipped Eliquis 1 or 2 times over the past 16-month period.  He is feeling comfortable right now, no further shortness of breath, has not been out of bed ambulating.  He has quit smoking, is using nicotine gums.  He denied any orthopnea or leg edema.   Admission diagnostic from 08/24/2023 revealed AKI with creatinine 1.51 and GFR 49.  CBC with left shift.  Chest x-ray field no acute finding. EKG revealed atrial fibrillation/flutter with RVR 118 bpm.  He was given 500 cc IV fluid bolus, diltiazem bolus followed by diltiazem drip at ED. subsequent labs today reveal resolved AKI with creatinine 1.02.  TSH WNL.  Echocardiogram is pending.  Cardiology is consulted today for further evaluation.    Per chart review, patient follows [facility] clinic cardiology primarily.  He has multivessel CAD, cardiac cath 02/18/2022 showed patent SVG-RPDA/RPL, patent PVG-ramus intermedius, known occluded SVG-Lcx/OM, patent LIMA-LAD that filled primarily in retrograde fashion from native coronary artery. 95% ostial left subclavian stenosis. Patient was transferred to [facility] where he underwent stenting of the left subclavian artery.  He was hospitalized 06/2022 for acute on chronic hypoxic respiratory failure secondary to pneumonia, required mechanical ventilation.  Course complicated by concern of STEMI and ventricular tachycardia, ultimately felt he was having repolarization normality and SVT with aberrancy by cardiology.  He did develop bradycardia on metoprolol.  He was given amiodarone.   He was hospitalized  again 08/2022 for acute on chronic hypoxic respiratory failure requiring mechanical ventilation, non-STEMI with troponin peaked 2773, and recurrent wide complex tachycardia. Echo from 09/12/2022 with LVEF 60 to 65%, no regional wall motion abnormality,  grade 1 DD, mildly reduced RV, no significant valvular disease. EP was consulted to Dr. Nelly Laurence, felt he was having aberrant conduction of supraventricular rhythm (suspect ST, AT, AF, etc), not ventricular tachycardia, amiodarone was given and he converted to sinus rhythm, outpatient monitor was recommended and he was released on amiodarone 200 mg daily.  Repeat left heart catheterization on 09/16/2022 revealed unchanged coronary anatomy.  Troponin elevation was felt due to demand ischemia.  He was recommended continue therapy for mult.vessel CAD with PTA Plavix and Lipitor.  He remained Eliquis for paroxysmal A-fib.   He was last seen by his cardiologist Dr. Juliann Pares on 07/23/2023, felt much improved with shortness of breath and lower extremity edema, was more active than prior and reports quit smoking 1 year ago.  He was advised to continue Lipitor and Plavix for CAD, and continue diltiazem and Eliquis for paroxysmal atrial fibrillation, he was maintained Lasix 40 mg twice daily and spironolactone 12.5 mg for HFpEF.     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, SVG-OM1, SVG-PDA, SVG-PL   Hyperlipemia    Hypertension    Leucocytosis    Liver dysfunction    Long term current use of clopidogrel    NSTEMI (non-ST elevated myocardial infarction) (HCC)    approx 2016   Peripheral artery disease (HCC)    legs   Tobacco abuse    Wears dentures    full upper and lower    Past Surgical History:  Procedure Laterality Date   APPLICATION OF WOUND VAC Right 06/22/2020   Procedure: APPLICATION OF WOUND VAC;  Surgeon: Annice Needy, MD;  Location: [facility] ORS;  Service: Vascular;  Laterality: Right;    CARDIAC CATHETERIZATION N/A 07/10/2015   Procedure: Right/Left Heart Cath and Coronary Angiography;  Surgeon: Alwyn Pea, MD;  Location: [facility] INVASIVE CV LAB;  Service: Cardiovascular;  Laterality: N/A;   CARDIOVERSION N/A 10/14/2022   Procedure: CARDIOVERSION;  Surgeon: Alwyn Pea, MD;  Location: [facility] ORS;  Service: Cardiovascular;  Laterality: N/A;   CORONARY ARTERY BYPASS GRAFT  07/13/2015   [facility].  4 vessel   ENDARTERECTOMY Right 01/02/2021   Procedure: ENDARTERECTOMY CAROTID;  Surgeon: Annice Needy, MD;  Location: [facility] ORS;  Service: Vascular;  Laterality: Right;   ENDARTERECTOMY FEMORAL Bilateral 05/23/2020   Procedure: ENDARTERECTOMY FEMORAL BILATERAL SUPERFICIAL FEMORAL ARTERY STENTS ;  Surgeon: Renford Dills, MD;  Location: [facility] ORS;  Service: Vascular;  Laterality: Bilateral;   FALSE ANEURYSM REPAIR Left 07/22/2020   Procedure: REPAIR LEFT FEMORAL PSEUDOANUERYSM;  Surgeon: Bertram Denver, MD;  Location: [facility] ORS;  Service: Vascular;  Laterality: Left;   INSERTION OF ILIAC STENT Left 05/23/2020   Procedure: INSERTION OF ILIAC STENT;  Surgeon: Renford Dills, MD;  Location: [facility] ORS;  Service: Vascular;  Laterality: Left;   LEFT HEART CATH AND CORONARY ANGIOGRAPHY N/A 09/16/2022   Procedure: LEFT HEART CATH AND CORONARY  ANGIOGRAPHY;  Surgeon: Lennette Bihari, MD;  Location: [facility] INVASIVE CV LAB;  Service: Cardiovascular;  Laterality: N/A;   LEFT HEART CATH AND CORS/GRAFTS ANGIOGRAPHY N/A 01/23/2020   Procedure: LEFT HEART CATH AND CORS/GRAFTS ANGIOGRAPHY;  Surgeon: Alwyn Pea, MD;  Location: [facility] INVASIVE CV LAB;  Service: Cardiovascular;  Laterality: N/A;   LEFT HEART CATH AND CORS/GRAFTS ANGIOGRAPHY N/A 02/18/2022   Procedure: LEFT HEART CATH AND CORS/GRAFTS ANGIOGRAPHY;  Surgeon: Armando Reichert, MD;  Location: [facility] INVASIVE CV LAB;  Service: Cardiovascular;  Laterality: N/A;   LOWER EXTREMITY ANGIOGRAPHY Right 04/03/2020   Procedure: LOWER EXTREMITY  ANGIOGRAPHY;  Surgeon: Renford Dills, MD;  Location: [facility] INVASIVE CV LAB;  Service: Cardiovascular;  Laterality: Right;   OLECRANON BURSECTOMY Left 05/06/2019   Procedure: OLECRANON BURSECTOMY AND DEBRIDEMENT;  Surgeon: Signa Kell, MD;  Location: [facility] ORS;  Service: Orthopedics;  Laterality: Left;   WOUND DEBRIDEMENT Right 06/22/2020   Procedure: DEBRIDEMENT WOUND RIGHT GROIN;  Surgeon: Annice Needy, MD;  Location: [facility] ORS;  Service: Vascular;  Laterality: Right;     Home Medications:  Prior to Admission medications   Medication Sig Start Date End Date Taking? Authorizing Provider  atorvastatin (LIPITOR) 80 MG tablet Take 1 tablet (80 mg total) by mouth daily at 6 PM. Patient taking differently: Take 80 mg by mouth at bedtime. 07/10/15  Yes Houston Siren, MD  clopidogrel (PLAVIX) 75 MG tablet Take 75 mg by mouth at bedtime. 04/18/16  Yes [provider]  colchicine 0.6 MG tablet Take 0.6 mg by mouth daily as needed (gout flare).   Yes [provider]  diazepam (VALIUM) 5 MG tablet Take 1 tablet (5 mg total) by mouth every 12 (twelve) hours as needed for anxiety. Patient taking differently: Take 5 mg by mouth at bedtime. 09/20/22  Yes Tyrone Nine, MD  diltiazem (CARDIZEM CD) 120 MG 24 hr capsule Take by mouth. 02/25/23  Yes [provider]  ELIQUIS 5 MG TABS tablet Take 5 mg by mouth 2 (two) times daily. 02/20/22  Yes [provider]  Fluticasone-Umeclidin-Vilant (TRELEGY ELLIPTA) 100-62.5-25 MCG/INH AEPB Inhale 1 puff into the lungs every other day. 01/27/20  Yes [provider]  furosemide (LASIX) 40 MG tablet Take 40 mg by mouth 2 (two) times daily. 12/26/19  Yes [provider]  levalbuterol (XOPENEX) 1.25 MG/3ML nebulizer solution Take 1.25 mg by nebulization every 6 (six) hours as needed for wheezing.   Yes [provider]  OXYGEN Inhale 2 L into the lungs at bedtime as needed (02 sats < 90).   Yes [provider]   spironolactone (ALDACTONE) 25 MG tablet Take 12.5 mg by mouth daily. 02/20/22  Yes [provider]    Inpatient Medications: Scheduled Meds:  apixaban  5 mg Oral BID   atorvastatin  80 mg Oral QHS   clopidogrel  75 mg Oral QHS   diazepam  5 mg Oral QHS   fluticasone furoate-vilanterol  1 puff Inhalation Daily   And   umeclidinium bromide  1 puff Inhalation Daily   potassium chloride  40 mEq Oral BID   Continuous Infusions:  diltiazem (CARDIZEM) infusion 5 mg/hr (08/25/23 0403)   PRN Meds: albuterol  Allergies:    Allergies  Allergen Reactions   Hibiclens [Chlorhexidine Gluconate] Other (See Comments)    Unknown reaction    Social History:   Social History   Socioeconomic History   Marital status: Married    Spouse name: Not on file  Number of children: Not on file   Years of education: Not on file   Highest education level: Not on file  Occupational History   Not on file  Tobacco Use   Smoking status: Former    Current packs/day: 2.50    Average packs/day: 2.5 packs/day for 54.0 years (135.0 ttl pk-yrs)    Types: Cigarettes   Smokeless tobacco: Never   Tobacco comments:    0.5PPD 09/17/21  Vaping Use   Vaping status: Never Used  Substance and Sexual Activity   Alcohol use: Not Currently    Comment: quit over 15 yrs ago   Drug use: Never   Sexual activity: Not on file  Other Topics Concern   Not on file  Social History Narrative   Not on file   Social Determinants of Health   Financial Resource Strain: Low Risk  (04/28/2023)   Received from [facility]   Overall Financial Resource Strain (CARDIA)    Difficulty of Paying Living Expenses: Not hard at all  Food Insecurity: No Food Insecurity (08/24/2023)   Hunger Vital Sign    Worried About Running Out of Food in the Last Year: Never true    Ran Out of Food in the Last Year: Never true  Transportation Needs: No Transportation Needs (08/24/2023)   PRAPARE - Therapist, art (Medical): No    Lack of Transportation (Non-Medical): No  Physical Activity: Insufficiently Active (02/24/2020)   Received from [facility], [facility]   Exercise Vital Sign    Days of Exercise per Week: 2 days    Minutes of Exercise per Session: 40 min  Stress: No Stress Concern Present (02/24/2020)   Received from [facility], [facility]   [employer] of Occupational Health - Occupational Stress Questionnaire    Feeling of Stress : Not at all  Social Connections: Moderately Isolated (02/24/2020)   Received from [facility], [facility]   Social Connection and Isolation Panel [NHANES]    Frequency of Communication with Friends and Family: Never    Frequency of Social Gatherings with Friends and Family: Twice a week    Attends Religious Services: 1 to 4 times per year    Active Member of [employer] or Organizations: No    Attends Banker Meetings: Never    Marital Status: Married  Catering manager Violence: Not At Risk (08/24/2023)   Humiliation, Afraid, Rape, and Kick questionnaire    Fear of Current or Ex-Partner: No    Emotionally Abused: No    Physically Abused: No    Sexually Abused: No    Family History:    Family History  Problem Relation Age of Onset   Depression Sister      ROS:  Constitutional: Denied fever, chills, malaise, night sweats Eyes: Denied vision change or loss Ears/Nose/Mouth/Throat: Denied ear ache, sore throat, coughing, sinus pain Cardiovascular: See HPI Respiratory: See HPI Gastrointestinal: Denied nausea, vomiting, abdominal pain, diarrhea Genital/Urinary: Denied dysuria, hematuria, urinary frequency/urgency Musculoskeletal: Denied muscle ache, joint pain, weakness Skin: Denied rash, wound Neuro: Denied headache, dizziness, syncope Psych: history of depression/anxiety  Endocrine: Denied history of  diabetes   Physical Exam/Data:   Vitals:   08/25/23 0255 08/25/23 0256 08/25/23 0749 08/25/23 0827  BP: 96/61 96/61  (!) 98/44  Pulse: 69 69  68  Resp: (!) 21 17 19 20   Temp: 98.1 F (36.7 C) 98.1 F (  36.7 C)  (!) 97.3 F (36.3 C)  TempSrc: Oral   Oral  SpO2: 99% 98%  93%  Weight: 72.8 kg     Height: 5\' 5"  (1.651 m)       Intake/Output Summary (Last 24 hours) at 08/25/2023 0917 Last data filed at 08/25/2023 0403 Gross per 24 hour  Intake 773 ml  Output 200 ml  Net 573 ml      08/25/2023    2:55 AM 08/24/2023    5:04 PM 07/14/2023    2:14 PM  Last 3 Weights  Weight (lbs) 160 lb 7.9 oz 161 lb 158 lb  Weight (kg) 72.8 kg 73.029 kg 71.668 kg     Body mass index is 26.71 kg/m.   Vitals:  Vitals:   08/25/23 0749 08/25/23 0827  BP:  (!) 98/44  Pulse:  68  Resp: 19 20  Temp:  (!) 97.3 F (36.3 C)  SpO2:  93%   General Appearance: In no apparent distress, laying in bed HEENT: Normocephalic, atraumatic.  Neck: Supple, trachea midline, no JVDs Cardiovascular: Irregularly irregular, no murmur Respiratory: Resting breathing unlabored, lungs sounds clear to auscultation bilaterally, no use of accessory muscles. On 2 L nasal cannula oxygen, pox 100% Gastrointestinal: Bowel sounds positive, abdomen soft Extremities: Able to move all extremities in bed without difficulty, trace edema of bilateral lower extremity Musculoskeletal: Normal muscle bulk and tone Skin: Intact, warm, dry. No rashes or petechiae noted in exposed areas.  Neurologic: Alert, oriented to person, place and time. no gross focal neuro deficit Psychiatric: Normal affect. Mood is appropriate.    EKG:  The EKG was personally reviewed and demonstrates:    EKG from 08/24/2023 at 1655 revealed A-fib/flutter with RVR 118 bpm.   Telemetry:  Telemetry was personally reviewed and demonstrates:    Atrial flutter 3:1 with ventricular rate of 60 to 70s  Relevant CV Studies:   Echocardiogram is pending  today  Left heart catheterization 09/16/2022:    Prox RCA lesion is 75% stenosed.   Mid RCA lesion is 80% stenosed.   Mid LAD lesion is 60% stenosed.   Mid LM to Dist LM lesion is 85% stenosed.   Ramus lesion is 85% stenosed.   RPAV-1 lesion is 75% stenosed.   Ost Cx lesion is 90% stenosed.   Dist LM to Ost LAD lesion is 85% stenosed.   RPDA lesion is 95% stenosed.   RPAV-2 lesion is 70% stenosed.   1st Mrg lesion is 85% stenosed.   Origin to Prox Graft lesion is 100% stenosed.   Severe multivessel native CAD with 85% distal left main stenosis ostial LAD ramus intermediate and left circumflex stenoses.   The LAD has a percent proximal stenosis prior to the LIMA takeoff and there is competitive filling via the LIMA graft with the LAD injection.   This intermediate stenosis with ostial stenosis but with good competitive filling via the patent graft.   Left circumflex vessel with a 5% ostial stenosis.  The first marginal vessel is small caliber and has diffuse 8085% stenosis in the midsegment.   RCA is a dominant vessel that has diffuse 75% mid stenosis 80 to 70% mid distal stenosis, 95% ostial PDA stenosis with 70% mid continuation branch stenosis.  Competitive filling is seen in the posterolateral vessel.  No competitive filling to the PDA.   Patent vein graft supplying the ramus intermediate vessel.   Old occluded vein graft which had supplied the OM1 vessel.   Patent vein graft which  supplies the PLA vessel.  There is 40% stenosis in the distal third portion of the graft.  This graft was sequential graft which also had supplied the PDA vessel which is not visualized at this segment is occluded.     Patent subclavian artery stent arising from the aorta.   Patent LIMA graft supplying the mid LAD.   RECOMMENDATION: Resume the patient's prior Plavix/Eliquis with history of multi- vessel CAD, subclavian stent, and PAF. Medical therapy for CAD.  Rest of lipid-lowering therapy with  target LDL less than 70.    Echo 09/12/22:  1. Left ventricular ejection fraction, by estimation, is 60 to 65%. The  left ventricle has normal function. The left ventricle has no regional  wall motion abnormalities. Left ventricular diastolic parameters are  consistent with Grade I diastolic  dysfunction (impaired relaxation).   2. Right ventricular systolic function is mildly reduced. The right  ventricular size is normal.   3. The mitral valve is grossly normal. No evidence of mitral valve  regurgitation. No evidence of mitral stenosis.   4. The aortic valve was not well visualized. Aortic valve regurgitation  is not visualized. No aortic stenosis is present.    Laboratory Data:  High Sensitivity Troponin:  No results for input(s): "TROPONINIHS" in the last 720 hours.   Chemistry Recent Labs  Lab 08/24/23 1708 08/25/23 0329  NA 137 138  K 3.8 3.6  CL 99 102  CO2 29 27  GLUCOSE 113* 156*  BUN 10 11  CREATININE 1.51* 1.02  CALCIUM 9.1 8.8*  MG 2.0 1.9  GFRNONAA 49* >60  ANIONGAP 9 9    No results for input(s): "PROT", "ALBUMIN", "AST", "ALT", "ALKPHOS", "BILITOT" in the last 168 hours. Lipids No results for input(s): "CHOL", "TRIG", "HDL", "LABVLDL", "LDLCALC", "CHOLHDL" in the last 168 hours.  Hematology Recent Labs  Lab 08/24/23 1708  WBC 10.7*  RBC 5.09  HGB 14.8  HCT 46.1  MCV 90.6  MCH 29.1  MCHC 32.1  RDW 15.6*  PLT 210   Thyroid  Recent Labs  Lab 08/25/23 0329  TSH 3.072    BNPNo results for input(s): "BNP", "PROBNP" in the last 168 hours.  DDimer No results for input(s): "DDIMER" in the last 168 hours.   Radiology/Studies:  DG Chest Portable 1 View  Result Date: 08/24/2023 CLINICAL DATA:  Tachycardia EXAM: PORTABLE CHEST 1 VIEW COMPARISON:  Chest x-ray 05/11/2023 FINDINGS: Patient is status post cardiac surgery. The cardiomediastinal silhouette appears stable. The lungs are clear. There is no pleural effusion or pneumothorax. No acute  fractures are seen. IMPRESSION: No active disease. Electronically Signed   By: Darliss Cheney M.D.   On: 08/24/2023 17:52     Assessment and Plan:   Paroxysmal A-fib/flutter with RVR -Reports unusual shortness of breath at rest and with exertion started 08/24/2023 PM, resolved now -Admission diagnostic revealed AKI, mild left shift; TSH WNL, CXR no acute findings - suspect dehydration contributing  - Echo repeat today revealed LVEF 55 to 60%, no regional wall motion abnormality, mild concentric LVH, mildly reduced RV, aortic sclerosis, IVC is normal - currently on diltiazem drip 5mg /hr, 3:1 atrial flutter with ventricular response of 60 to 70s, will repeat EKG today, rate control seems improved after receiving IV fluids, would stop diltiazem drip and resume home diltiazem 120 mg daily  -Discussed importance of compliance with Eliquis, advised patient to continue daily Eliquis until follow-up with primary cardiologist, consider DCCV if remains in atrial flutter - CHA2DS2-VASc Score = 6 ,  continue PTA anticoagulation Eliquis 5 mg twice daily  Chronic diastolic heart failure -Heart failure symptom has overall been stable - CXR no acute finding, POA  - will check BNP today, Echo is pending will follow  - Suspect symptom is A-fib RVR triggering, clinical exam appears euvolemic  - given resolved AKI, will hold PTA Lasix 40 mg twice daily for 24 hours, encourage PO hydration, continue PTA spironolactone 12.5 mg daily (history of bradycardia on beta-blocker), will defer to primary cardiology team initiation of SGLT2  CAD with hx of CABG - last LHC 08/2022, stable disease, recommended medical therapy  - no chest pain, SOB is like from A fib RVR and has resolved  - continue plavix and lipitor   AKI, resolved  COPD OSA Anxiety  HTN  HLD - per primary team      Risk Assessment/Risk Scores:      New York Heart Association (NYHA) Functional Class NYHA Class II  CHA2DS2-VASc Score = 6   This indicates a 9.7% annual risk of stroke. The patient's score is based upon: CHF History: 1 HTN History: 1 Diabetes History: 0 Stroke History: 2 Vascular Disease History: 1 Age Score: 1 Gender Score: 0      For questions or updates, please contact [facility] Please consult [email or web address] for contact info under    Signed, Cyndi Bender, NP  08/25/2023 9:17 AM

## 2023-08-25 NOTE — Assessment & Plan Note (Addendum)
Presenting in atrial flutter with variable conduction, rates up to 150, s/p diltiazem 20 mg and now on gtt 5 mg/hr. Rates have improved to the 70s with 4:1 flutter on the monitor.  Was scheduled for A Fib/Flutter cardioversion in January but spontaneously converted prior to procedure. Given past cardiac surgical history, he is susceptible to arrhythmias. No missed doses of Eliquis. TSH WNL at 3.072.  Bradycardia to the 30s, A-fib/flutter, tachycardia to the 150s noted on telemetry overnight.  Cardiology has been consulted. 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

## 2023-08-25 NOTE — ED Notes (Signed)
ED TO INPATIENT HANDOFF REPORT  ED Nurse Name and Phone #: [phone]  S Name/Age/Gender Dennis Preston 72 y.o. male Room/Bed: 042C/042C  Code Status   Code Status: Full Code  Home/SNF/Other Home Patient oriented to: self, place, time, and situation Is this baseline? Yes   Triage Complete: Triage complete  Chief Complaint Atrial flutter (HCC) [I48.92] Atrial fibrillation and flutter (HCC) [I48.91, I48.92]  Triage Note Hx of CABG, several MI's, CHF, CAD. Pt reports shortness of breath since this AM. Pt has a monitor at home showing his HR ranging at 150s bpm. Uses O2 at home PRN. Pt reports he had to use O2 this AM. Alert and oriented x 4.    Allergies Allergies  Allergen Reactions   Hibiclens [Chlorhexidine Gluconate] Other (See Comments)    Unknown reaction    Level of Care/Admitting Diagnosis ED Disposition     ED Disposition  Admit   Condition  --   Comment  Hospital Area: [facility] [[id number]]  Level of Care: Telemetry Cardiac [[id number]]  May place patient in observation at [facility] or [facility] if equivalent level of care is available:: No  Covid Evaluation: Asymptomatic - no recent exposure (last 10 days) testing not required  Diagnosis: Atrial fibrillation and flutter [facility]) [[id number]]  Admitting Physician: Nelia Shi [[id number]]  Attending Physician: MCDIARMID, TODD D [[id number]]          B Medical/Surgery History 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, SVG-OM1, SVG-PDA, SVG-PL   Hyperlipemia    Hypertension    Leucocytosis    Liver dysfunction    Long term current use of clopidogrel    NSTEMI (non-ST elevated myocardial  infarction) (HCC)    approx 2016   Peripheral artery disease (HCC)    legs   Tobacco abuse    Wears dentures    full upper and lower   Past Surgical History:  Procedure Laterality Date   APPLICATION OF WOUND VAC Right 06/22/2020   Procedure: APPLICATION OF WOUND VAC;  Surgeon: Annice Needy, MD;  Location: [facility] ORS;  Service: Vascular;  Laterality: Right;   CARDIAC CATHETERIZATION N/A 07/10/2015   Procedure: Right/Left Heart Cath and Coronary Angiography;  Surgeon: Alwyn Pea, MD;  Location: [facility] INVASIVE CV LAB;  Service: Cardiovascular;  Laterality: N/A;   CARDIOVERSION N/A 10/14/2022   Procedure: CARDIOVERSION;  Surgeon: Alwyn Pea, MD;  Location: [facility] ORS;  Service: Cardiovascular;  Laterality: N/A;   CORONARY ARTERY BYPASS GRAFT  07/13/2015   [facility].  4 vessel   ENDARTERECTOMY Right 01/02/2021   Procedure: ENDARTERECTOMY CAROTID;  Surgeon: Annice Needy, MD;  Location: [facility] ORS;  Service: Vascular;  Laterality: Right;   ENDARTERECTOMY FEMORAL Bilateral 05/23/2020   Procedure: ENDARTERECTOMY FEMORAL BILATERAL SUPERFICIAL FEMORAL ARTERY STENTS ;  Surgeon: Renford Dills, MD;  Location: [facility] ORS;  Service: Vascular;  Laterality: Bilateral;   FALSE ANEURYSM REPAIR Left 07/22/2020   Procedure: REPAIR LEFT FEMORAL PSEUDOANUERYSM;  Surgeon: Bertram Denver, MD;  Location: [facility] ORS;  Service: Vascular;  Laterality: Left;   INSERTION OF ILIAC STENT Left 05/23/2020  Procedure: INSERTION OF ILIAC STENT;  Surgeon: Renford Dills, MD;  Location: [facility] ORS;  Service: Vascular;  Laterality: Left;   LEFT HEART CATH AND CORONARY ANGIOGRAPHY N/A 09/16/2022   Procedure: LEFT HEART CATH AND CORONARY ANGIOGRAPHY;  Surgeon: Lennette Bihari, MD;  Location: [facility] INVASIVE CV LAB;  Service: Cardiovascular;  Laterality: N/A;   LEFT HEART CATH AND CORS/GRAFTS ANGIOGRAPHY N/A 01/23/2020   Procedure: LEFT HEART CATH AND CORS/GRAFTS ANGIOGRAPHY;  Surgeon: Alwyn Pea, MD;  Location: [facility] INVASIVE  CV LAB;  Service: Cardiovascular;  Laterality: N/A;   LEFT HEART CATH AND CORS/GRAFTS ANGIOGRAPHY N/A 02/18/2022   Procedure: LEFT HEART CATH AND CORS/GRAFTS ANGIOGRAPHY;  Surgeon: Armando Reichert, MD;  Location: [facility] INVASIVE CV LAB;  Service: Cardiovascular;  Laterality: N/A;   LOWER EXTREMITY ANGIOGRAPHY Right 04/03/2020   Procedure: LOWER EXTREMITY ANGIOGRAPHY;  Surgeon: Renford Dills, MD;  Location: [facility] INVASIVE CV LAB;  Service: Cardiovascular;  Laterality: Right;   OLECRANON BURSECTOMY Left 05/06/2019   Procedure: OLECRANON BURSECTOMY AND DEBRIDEMENT;  Surgeon: Signa Kell, MD;  Location: [facility] ORS;  Service: Orthopedics;  Laterality: Left;   WOUND DEBRIDEMENT Right 06/22/2020   Procedure: DEBRIDEMENT WOUND RIGHT GROIN;  Surgeon: Annice Needy, MD;  Location: [facility] ORS;  Service: Vascular;  Laterality: Right;     A IV Location/Drains/Wounds Patient Lines/Drains/Airways Status     Active Line/Drains/Airways     Name Placement date Placement time Site Days   Peripheral IV 08/24/23 18 G Left Antecubital 08/24/23  1703  Antecubital  1   Peripheral IV 08/24/23 18 G Right Antecubital 08/24/23  1703  Antecubital  1            Intake/Output Last 24 hours  Intake/Output Summary (Last 24 hours) at 08/25/2023 0145 Last data filed at 08/24/2023 1934 Gross per 24 hour  Intake 500 ml  Output --  Net 500 ml    Labs/Imaging Results for orders placed or performed during the hospital encounter of 08/24/23 (from the past 48 hour(s))  Magnesium     Status: None   Collection Time: 08/24/23  5:08 PM  Result Value Ref Range   Magnesium 2.0 1.7 - 2.4 mg/dL    Comment: Performed at [facility], [address], [place], [place] [address]  Basic metabolic panel     Status: Abnormal   Collection Time: 08/24/23  5:08 PM  Result Value Ref Range   Sodium 137 135 - 145 mmol/L   Potassium 3.8 3.5 - 5.1 mmol/L   Chloride 99 98 - 111 mmol/L   CO2 29 22 - 32 mmol/L   Glucose, Bld 113  (H) 70 - 99 mg/dL    Comment: Glucose reference range applies only to samples taken after fasting for at least 8 hours.   BUN 10 8 - 23 mg/dL   Creatinine, Ser 6.04 (H) 0.61 - 1.24 mg/dL   Calcium 9.1 8.9 - 54.0 mg/dL   GFR, Estimated 49 (L) >60 mL/min    Comment: (NOTE) Calculated using the CKD-EPI Creatinine Equation (2021)    Anion gap 9 5 - 15    Comment: Performed at [facility], [address], [place], [place] [address]  CBC with Differential     Status: Abnormal   Collection Time: 08/24/23  5:08 PM  Result Value Ref Range   WBC 10.7 (H) 4.0 - 10.5 K/uL   RBC 5.09 4.22 - 5.81 MIL/uL   Hemoglobin 14.8 13.0 - 17.0 g/dL   HCT  46.1 39.0 - 52.0 %   MCV 90.6 80.0 - 100.0 fL   MCH 29.1 26.0 - 34.0 pg   MCHC 32.1 30.0 - 36.0 g/dL   RDW 21.3 (H) 08.6 - 57.8 %   Platelets 210 150 - 400 K/uL   nRBC 0.0 0.0 - 0.2 %   Neutrophils Relative % 82 %   Neutro Abs 8.8 (H) 1.7 - 7.7 K/uL   Lymphocytes Relative 7 %   Lymphs Abs 0.7 0.7 - 4.0 K/uL   Monocytes Relative 10 %   Monocytes Absolute 1.0 0.1 - 1.0 K/uL   Eosinophils Relative 1 %   Eosinophils Absolute 0.1 0.0 - 0.5 K/uL   Basophils Relative 0 %   Basophils Absolute 0.0 0.0 - 0.1 K/uL   Immature Granulocytes 0 %   Abs Immature Granulocytes 0.04 0.00 - 0.07 K/uL    Comment: Performed at [facility], [address], [place], [place] [address]   DG Chest Portable 1 View  Result Date: 08/24/2023 CLINICAL DATA:  Tachycardia EXAM: PORTABLE CHEST 1 VIEW COMPARISON:  Chest x-ray 05/11/2023 FINDINGS: Patient is status post cardiac surgery. The cardiomediastinal silhouette appears stable. The lungs are clear. There is no pleural effusion or pneumothorax. No acute fractures are seen. IMPRESSION: No active disease. Electronically Signed   By: Darliss Cheney M.D.   On: 08/24/2023 17:52    Pending Labs Unresulted Labs (From admission, onward)     Start     Ordered   08/25/23 0500  Basic metabolic panel  Tomorrow morning,   R         08/24/23 2048   08/25/23 0500  Magnesium  Tomorrow morning,   R        08/24/23 2048   08/25/23 0500  TSH  Tomorrow morning,   R        08/24/23 2048            Vitals/Pain Today's Vitals   08/24/23 2200 08/24/23 2330 08/25/23 0000 08/25/23 0030  BP: (!) 114/58  117/64   Pulse: 69 71 69   Resp:  20  19  Temp:      TempSrc:      SpO2: 95% 93% 96%   Weight:      Height:      PainSc:        Isolation Precautions No active isolations  Medications Medications  diltiazem (CARDIZEM) 1 mg/mL load via infusion 20 mg (20 mg Intravenous Bolus from Bag 08/24/23 1724)    And  diltiazem (CARDIZEM) 125 mg in dextrose 5% 125 mL (1 mg/mL) infusion (5 mg/hr Intravenous New Bag/Given 08/24/23 1724)  atorvastatin (LIPITOR) tablet 80 mg (80 mg Oral Given 08/24/23 2155)  diazepam (VALIUM) tablet 5 mg (5 mg Oral Given 08/24/23 2155)  clopidogrel (PLAVIX) tablet 75 mg (75 mg Oral Given 08/24/23 2155)  apixaban (ELIQUIS) tablet 5 mg (5 mg Oral Given 08/24/23 2155)  fluticasone furoate-vilanterol (BREO ELLIPTA) 100-25 MCG/ACT 1 puff (has no administration in time range)    And  umeclidinium bromide (INCRUSE ELLIPTA) 62.5 MCG/ACT 1 puff (has no administration in time range)  albuterol (PROVENTIL) (2.5 MG/3ML) 0.083% nebulizer solution 2.5 mg (has no administration in time range)  potassium chloride (KLOR-CON) packet 40 mEq (40 mEq Oral Given 08/25/23 0000)  lactated ringers bolus 500 mL (0 mLs Intravenous Stopped 08/24/23 1934)    Mobility walks     Focused Assessments Now in 3:1 atrial flutter. 69-75 hr.   R Recommendations: See Admitting  Provider Note  Report given to:   Additional Notes:

## 2023-08-25 NOTE — Assessment & Plan Note (Addendum)
Last echo 09/12/2022 with EF 60-65% and G1DD.  Euvolemic on exam.  CXR without significant vascular congestion. Echo ordered.  -Follow up cardiology recs:  -BNP today  -restart home spironolactone 12.5 daily -Continue to hold home lasix 80 BID and spironolactone 12.5 daily given soft BPs-Daily weights, strict I&Os -Follow up echo

## 2023-08-25 NOTE — Plan of Care (Signed)
  Problem: Education: Goal: Knowledge of General Education information will improve Description: Including pain rating scale, medication(s)/side effects and non-pharmacologic comfort measures 08/25/2023 1934 by Collene Gobble, RN Outcome: Progressing 08/25/2023 1934 by Collene Gobble, RN Outcome: Progressing   Problem: Health Behavior/Discharge Planning: Goal: Ability to manage health-related needs will improve 08/25/2023 1934 by Collene Gobble, RN Outcome: Progressing 08/25/2023 1934 by Collene Gobble, RN Outcome: Progressing   Problem: Clinical Measurements: Goal: Ability to maintain clinical measurements within normal limits will improve 08/25/2023 1934 by Collene Gobble, RN Outcome: Progressing 08/25/2023 1934 by Collene Gobble, RN Outcome: Progressing Goal: Will remain free from infection 08/25/2023 1934 by Collene Gobble, RN Outcome: Progressing 08/25/2023 1934 by Collene Gobble, RN Outcome: Progressing Goal: Diagnostic test results will improve 08/25/2023 1934 by Collene Gobble, RN Outcome: Progressing 08/25/2023 1934 by Collene Gobble, RN Outcome: Progressing Goal: Respiratory complications will improve 08/25/2023 1934 by Collene Gobble, RN Outcome: Progressing 08/25/2023 1934 by Collene Gobble, RN Outcome: Progressing Goal: Cardiovascular complication will be avoided 08/25/2023 1934 by Collene Gobble, RN Outcome: Progressing 08/25/2023 1934 by Collene Gobble, RN Outcome: Progressing   Problem: Activity: Goal: Risk for activity intolerance will decrease 08/25/2023 1934 by Collene Gobble, RN Outcome: Progressing 08/25/2023 1934 by Collene Gobble, RN Outcome: Progressing   Problem: Nutrition: Goal: Adequate nutrition will be maintained 08/25/2023 1934 by Collene Gobble, RN Outcome: Progressing 08/25/2023 1934 by Collene Gobble, RN Outcome: Progressing   Problem: Coping: Goal: Level of anxiety will decrease 08/25/2023 1934 by Collene Gobble, RN Outcome: Progressing 08/25/2023 1934 by Collene Gobble, RN Outcome: Progressing   Problem: Elimination: Goal: Will not experience complications related to bowel motility 08/25/2023 1934 by Collene Gobble, RN Outcome: Progressing 08/25/2023 1934 by Collene Gobble, RN Outcome: Progressing Goal: Will not experience complications related to urinary retention 08/25/2023 1934 by Collene Gobble, RN Outcome: Progressing 08/25/2023 1934 by Collene Gobble, RN Outcome: Progressing   Problem: Pain Management: Goal: General experience of comfort will improve 08/25/2023 1934 by Collene Gobble, RN Outcome: Progressing 08/25/2023 1934 by Collene Gobble, RN Outcome: Progressing   Problem: Safety: Goal: Ability to remain free from injury will improve 08/25/2023 1934 by Collene Gobble, RN Outcome: Progressing 08/25/2023 1934 by Collene Gobble, RN Outcome: Progressing   Problem: Skin Integrity: Goal: Risk for impaired skin integrity will decrease 08/25/2023 1934 by Collene Gobble, RN Outcome: Progressing 08/25/2023 1934 by Collene Gobble, RN Outcome: Progressing   Problem: Education: Goal: Knowledge of disease or condition will improve 08/25/2023 1934 by Collene Gobble, RN Outcome: Progressing 08/25/2023 1934 by Collene Gobble, RN Outcome: Progressing Goal: Understanding of medication regimen will improve 08/25/2023 1934 by Collene Gobble, RN Outcome: Progressing 08/25/2023 1934 by Collene Gobble, RN Outcome: Progressing   Problem: Cardiac: Goal: Ability to achieve and maintain adequate cardiopulmonary perfusion will improve 08/25/2023 1934 by Collene Gobble, RN Outcome: Progressing 08/25/2023 1934 by Collene Gobble, RN Outcome: Progressing   Problem: Health Behavior/Discharge Planning: Goal: Ability to safely manage health-related needs after discharge will improve 08/25/2023 1934 by Collene Gobble, RN Outcome: Progressing 08/25/2023 1934 by  Collene Gobble, RN Outcome: Progressing

## 2023-08-25 NOTE — Discharge Instructions (Addendum)
Dear Dennis Preston,  Thank you for letting us participate in your care. You were hospitalized for elevated heart rate and diagnosed with Atrial flutter (HCC). You were treated with diltiazem, a medication which helps slow down the heart rate. The Cardiology team also saw you and provided recommendations for your care.   POST-HOSPITAL & CARE INSTRUCTIONS Please make sure to follow-up with your cardiologist in 1 week. Take your medications as directed in this packet.  Plan to see your PCP in the next couple of weeks to follow up on your hospitalization.  Go to all of your follow up appointments (listed below)   DOCTOR'S APPOINTMENT   Future Appointments  Date Time Provider Department Center  01/12/2024  2:00 PM [facility] None  01/12/2024  2:30 PM [facility] None  01/12/2024  3:30 PM Dew, Marlow Baars, MD [facility] None    Take care and be well!  Family Medicine Teaching Service Inpatient Team [place]  [facility]  [address] [place], [place] [address] [phone]

## 2023-08-26 DIAGNOSIS — I483 Typical atrial flutter: Secondary | ICD-10-CM | POA: Diagnosis not present

## 2023-08-26 LAB — BASIC METABOLIC PANEL
Anion gap: 5 (ref 5–15)
BUN: 10 mg/dL (ref 8–23)
CO2: 28 mmol/L (ref 22–32)
Calcium: 8.9 mg/dL (ref 8.9–10.3)
Chloride: 106 mmol/L (ref 98–111)
Creatinine, Ser: 0.94 mg/dL (ref 0.61–1.24)
GFR, Estimated: >60 mL/min (ref >60)
Glucose, Bld: 93 mg/dL (ref 70–99)
Potassium: 4.4 mmol/L (ref 3.5–5.1)
Sodium: 139 mmol/L (ref 135–145)

## 2023-08-26 LAB — MAGNESIUM: Magnesium: 2 mg/dL (ref 1.7–2.4)

## 2023-08-26 MED ORDER — FUROSEMIDE 40 MG PO TABS: ORAL_TABLET | ORAL | Status: AC

## 2023-08-26 MED ORDER — DIAZEPAM 5 MG PO TABS: 5.0000 mg | ORAL_TABLET | Freq: Every day | ORAL | Status: AC

## 2023-08-26 NOTE — Assessment & Plan Note (Deleted)
Last echo 09/12/2022 with EF 60-65% and G1DD.  Euvolemic on exam.  CXR without significant vascular congestion. Echo ordered.  -Follow up cardiology recs:  -BNP today  -restart home spironolactone 12.5 daily -Continue to hold home lasix 80 BID and spironolactone 12.5 daily given soft BPs-Daily weights, strict I&Os -Follow up echo

## 2023-08-26 NOTE — Discharge Summary (Addendum)
Family Medicine Teaching [facility] Discharge Summary  Patient name: Dennis Preston Medical record number: [id number] Date of birth: [birth year] Age: 72 y.o. Gender: male Date of Admission: 08/24/2023  Date of Discharge: 08/26/23 Admitting Physician: Leighton Roach McDiarmid, MD  Primary Care Provider: Barbette Reichmann, MD Consultants: Cardiology   Indication for Hospitalization: A Flutter with RVR  Discharge Diagnoses/Problem List:  Principal Problem for Admission: A Flutter with RVR  Other Problems addressed during stay:  Principal Problem:   Atrial flutter (HCC) Active Problems:   COPD (chronic obstructive pulmonary disease) with chronic bronchitis (HCC)   On home oxygen therapy   AKI (acute kidney injury) (HCC)   Tachycardia   Chronic heart failure with preserved ejection fraction [facility])   Atrial fibrillation and flutter [facility])   Brief Hospital Course:  Dennis Preston is a 72 y.o. year old with a history of afib/flutter who presented with palpitations and SOB and was admitted to the [facility] Medicine Teaching service for A Flutter with RVR. His hospital course is as follows:  Atrial flutter  Palpitations  Patient presented in A Flutter with variable conduction, rates up to 150. He was treated with a diltiazem gtt with improvement in rates. EKG with aflutter, 118bpm, and incomplete RBBB. TSH WNL at 3.072.  Bradycardia to the 30s, A-fib/flutter, tachycardia to the 150s noted on telemetry. Cardiology was consulted and provided greatly appreciated recommendations. Patient improved symptomatically with HRs in the 60s, and transitioned to PO diltiazem. By time of discharge, patient asymptomatic with rate controlled aflutter. Cardiology recommended the following on discharge:  - Cardizem 120 daily - Eliquis 5 BID - Atorvastatin 80 daily -Plavix 75 daily -Lasix 40 in AM and 20 in PM -Spironolactone 12.5 daily - was held at discharge d/t soft pressures, can be restarted out pt.    HFpEF Patient remained euvolemic on exam through admission. CXR without significant vascular congestion. Bnp 74.1. Echo during admission with EF 55-60%.   AKI Patient with normal baseline Cr. Elevated Cr to 1.51 on arrival. Returned to baseline 1.02 by time of discharge.   Other chronic conditions were medically managed with home medications and formulary alternatives as necessary (Hx MI, HTN, HLD, COPD, Anxiety).  PCP Follow-up Recommendations: Consider weaning diazepam 5 mg nightly Patient interested in further nicotine cessation given arrhythmogenic effects of nicotine Follow up cardiology recommendations  Restart spironolactone if appropriate with BP BMP to check electrolytes as lasix was restarted at discharge   Disposition: Home  Discharge Condition: Improved and stable  Discharge Exam:  Vitals:   08/26/23 0450 08/26/23 0835  BP: (!) 102/45 120/69  Pulse: 65 78  Resp: 16 (!) 22  Temp: 97.8 F (36.6 C) 97.6 F (36.4 C)  SpO2: 95%    General: Well-appearing. Resting comfortably in room. CV: Irregular rhythm. No gallops or heaves. Warm and well-perfused. Pulm: Breathing comfortably on room air. CTAB. No increased WOB. Abd: Soft, non-tender, non-distended. Skin:  Warm, dry. Psych: Pleasant and appropriate.   Significant Procedures:  Echo: EF 55-60%. Mild LVH. Mild RV dysfunction.   Significant Labs and Imaging:  Recent Labs  Lab 08/24/23 1708  WBC 10.7*  HGB 14.8  HCT 46.1  PLT 210   Recent Labs  Lab 08/24/23 1708 08/25/23 0329 08/26/23 0314  NA 137 138 139  K 3.8 3.6 4.4  CL 99 102 106  CO2 29 27 28   GLUCOSE 113* 156* 93  BUN 10 11 10   CREATININE 1.51* 1.02 0.94  CALCIUM 9.1 8.8* 8.9  MG  2.0 1.9 2.0   Lab Results  Component Value Date   TSH 3.072 08/25/2023    CXR 11/25: No active disease.  Echo 11/26: EF 55-60%. Mild LVH. Mild RV dysfunction.   Results/Tests Pending at Time of Discharge: None  Discharge Medications:  Allergies as of  08/26/2023       Reactions   Hibiclens [chlorhexidine Gluconate] Other (See Comments)   Unknown reaction        Medication List     STOP taking these medications    spironolactone 25 MG tablet Commonly known as: ALDACTONE       TAKE these medications    atorvastatin 80 MG tablet Commonly known as: LIPITOR Take 1 tablet (80 mg total) by mouth daily at 6 PM. What changed: when to take this   clopidogrel 75 MG tablet Commonly known as: PLAVIX Take 75 mg by mouth at bedtime.   colchicine 0.6 MG tablet Take 0.6 mg by mouth daily as needed (gout flare).   diazepam 5 MG tablet Commonly known as: VALIUM Take 1 tablet (5 mg total) by mouth at bedtime.   diltiazem 120 MG 24 hr capsule Commonly known as: CARDIZEM CD Take by mouth.   Eliquis 5 MG Tabs tablet Generic drug: apixaban Take 5 mg by mouth 2 (two) times daily.   furosemide 40 MG tablet Commonly known as: LASIX Take 40 mg in the morning and 20 mg (1/2 tablet) in the evening What changed:  how much to take how to take this when to take this additional instructions   levalbuterol 1.25 MG/3ML nebulizer solution Commonly known as: XOPENEX Take 1.25 mg by nebulization every 6 (six) hours as needed for wheezing.   OXYGEN Inhale 2 L into the lungs at bedtime as needed (02 sats < 90).   Trelegy Ellipta 100-62.5-25 MCG/INH Aepb Generic drug: Fluticasone-Umeclidin-Vilant Inhale 1 puff into the lungs every other day.        Discharge Instructions: Please refer to Patient Instructions section of EMR for full details.  Patient was counseled important signs and symptoms that should prompt return to medical care, changes in medications, dietary instructions, activity restrictions, and follow up appointments.   Follow-Up Appointments:  Future Appointments  Date Time Provider Department Center  01/12/2024  2:00 PM [facility] None  01/12/2024  2:30 PM [facility] None  01/12/2024  3:30 PM Dew,  Marlow Baars, MD [facility] None   Ivery Quale, MD PGY-1, [facility] Family Medicine   I reviewed the medical decision making and verified the service and findings are accurately documented in the resident's note.  Erick Alley, DO 08/26/2023 1:39 PM

## 2023-08-26 NOTE — Assessment & Plan Note (Deleted)
Presenting in atrial flutter with variable conduction, rates up to 150, s/p diltiazem 20 mg and now on gtt 5 mg/hr. Rates have improved to the 70s with 4:1 flutter on the monitor.  Was scheduled for A Fib/Flutter cardioversion in January but spontaneously converted prior to procedure. Given past cardiac surgical history, he is susceptible to arrhythmias. No missed doses of Eliquis. TSH WNL at 3.072.  Bradycardia to the 30s, A-fib/flutter, tachycardia to the 150s noted on telemetry overnight.  Cardiology on board. Echo with EF 55-60%.  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

## 2023-08-26 NOTE — Assessment & Plan Note (Deleted)
Creatinine 1.51 on arrival (baseline ~0.9), now resolved at 1.02.  - Continue to hold home lasix 80 BID and spironolactone 12.5 daily given soft BPs - AM BMP, trend Cr

## 2023-09-18 ENCOUNTER — Other Ambulatory Visit: Payer: Self-pay | Admitting: *Deleted

## 2023-09-18 DIAGNOSIS — Z87891 Personal history of nicotine dependence: Secondary | ICD-10-CM

## 2023-09-18 DIAGNOSIS — Z122 Encounter for screening for malignant neoplasm of respiratory organs: Secondary | ICD-10-CM

## 2023-10-09 DIAGNOSIS — H524 Presbyopia: Secondary | ICD-10-CM | POA: Diagnosis not present

## 2023-10-13 DIAGNOSIS — I480 Paroxysmal atrial fibrillation: Secondary | ICD-10-CM | POA: Diagnosis not present

## 2023-10-13 DIAGNOSIS — Z7901 Long term (current) use of anticoagulants: Secondary | ICD-10-CM | POA: Diagnosis not present

## 2023-10-16 DIAGNOSIS — I480 Paroxysmal atrial fibrillation: Secondary | ICD-10-CM | POA: Diagnosis not present

## 2023-10-21 DIAGNOSIS — Z7901 Long term (current) use of anticoagulants: Secondary | ICD-10-CM | POA: Diagnosis not present

## 2023-10-21 DIAGNOSIS — I480 Paroxysmal atrial fibrillation: Secondary | ICD-10-CM | POA: Diagnosis not present

## 2023-10-27 DIAGNOSIS — Z7901 Long term (current) use of anticoagulants: Secondary | ICD-10-CM | POA: Diagnosis not present

## 2023-10-27 DIAGNOSIS — I480 Paroxysmal atrial fibrillation: Secondary | ICD-10-CM | POA: Diagnosis not present

## 2023-11-03 DIAGNOSIS — J449 Chronic obstructive pulmonary disease, unspecified: Secondary | ICD-10-CM | POA: Diagnosis not present

## 2023-11-03 DIAGNOSIS — Z7901 Long term (current) use of anticoagulants: Secondary | ICD-10-CM | POA: Diagnosis not present

## 2023-11-03 DIAGNOSIS — G4733 Obstructive sleep apnea (adult) (pediatric): Secondary | ICD-10-CM | POA: Diagnosis not present

## 2023-11-03 DIAGNOSIS — R918 Other nonspecific abnormal finding of lung field: Secondary | ICD-10-CM | POA: Diagnosis not present

## 2023-11-03 DIAGNOSIS — Z9981 Dependence on supplemental oxygen: Secondary | ICD-10-CM | POA: Diagnosis not present

## 2023-11-03 DIAGNOSIS — I480 Paroxysmal atrial fibrillation: Secondary | ICD-10-CM | POA: Diagnosis not present

## 2023-11-10 DIAGNOSIS — Z7901 Long term (current) use of anticoagulants: Secondary | ICD-10-CM | POA: Diagnosis not present

## 2023-11-10 DIAGNOSIS — I480 Paroxysmal atrial fibrillation: Secondary | ICD-10-CM | POA: Diagnosis not present

## 2023-11-20 DIAGNOSIS — Z7901 Long term (current) use of anticoagulants: Secondary | ICD-10-CM | POA: Diagnosis not present

## 2023-11-20 DIAGNOSIS — I480 Paroxysmal atrial fibrillation: Secondary | ICD-10-CM | POA: Diagnosis not present

## 2023-11-23 ENCOUNTER — Encounter (HOSPITAL_COMMUNITY): Payer: Self-pay | Admitting: Radiology

## 2023-11-23 ENCOUNTER — Emergency Department (HOSPITAL_COMMUNITY): Payer: Medicare HMO

## 2023-11-23 ENCOUNTER — Other Ambulatory Visit: Payer: Self-pay

## 2023-11-23 ENCOUNTER — Observation Stay (HOSPITAL_COMMUNITY)
Admission: EM | Admit: 2023-11-23 | Discharge: 2023-11-26 | Disposition: A | Discharge status: Home / Self Care | Payer: Medicare HMO | Attending: Internal Medicine | Admitting: Internal Medicine

## 2023-11-23 DIAGNOSIS — I483 Typical atrial flutter: Secondary | ICD-10-CM | POA: Diagnosis not present

## 2023-11-23 DIAGNOSIS — Z7901 Long term (current) use of anticoagulants: Secondary | ICD-10-CM | POA: Insufficient documentation

## 2023-11-23 DIAGNOSIS — F411 Generalized anxiety disorder: Secondary | ICD-10-CM | POA: Diagnosis not present

## 2023-11-23 DIAGNOSIS — R778 Other specified abnormalities of plasma proteins: Secondary | ICD-10-CM | POA: Diagnosis not present

## 2023-11-23 DIAGNOSIS — J432 Centrilobular emphysema: Secondary | ICD-10-CM | POA: Diagnosis not present

## 2023-11-23 DIAGNOSIS — I4891 Unspecified atrial fibrillation: Secondary | ICD-10-CM | POA: Diagnosis not present

## 2023-11-23 DIAGNOSIS — J441 Chronic obstructive pulmonary disease with (acute) exacerbation: Secondary | ICD-10-CM | POA: Diagnosis not present

## 2023-11-23 DIAGNOSIS — Z79899 Other long term (current) drug therapy: Secondary | ICD-10-CM | POA: Insufficient documentation

## 2023-11-23 DIAGNOSIS — G4734 Idiopathic sleep related nonobstructive alveolar hypoventilation: Secondary | ICD-10-CM | POA: Diagnosis present

## 2023-11-23 DIAGNOSIS — I5032 Chronic diastolic (congestive) heart failure: Secondary | ICD-10-CM | POA: Diagnosis present

## 2023-11-23 DIAGNOSIS — G4736 Sleep related hypoventilation in conditions classified elsewhere: Secondary | ICD-10-CM | POA: Insufficient documentation

## 2023-11-23 DIAGNOSIS — Z951 Presence of aortocoronary bypass graft: Secondary | ICD-10-CM | POA: Insufficient documentation

## 2023-11-23 DIAGNOSIS — I4892 Unspecified atrial flutter: Secondary | ICD-10-CM | POA: Diagnosis not present

## 2023-11-23 DIAGNOSIS — R0902 Hypoxemia: Secondary | ICD-10-CM | POA: Diagnosis not present

## 2023-11-23 DIAGNOSIS — Z87891 Personal history of nicotine dependence: Secondary | ICD-10-CM | POA: Diagnosis not present

## 2023-11-23 DIAGNOSIS — E785 Hyperlipidemia, unspecified: Secondary | ICD-10-CM | POA: Diagnosis present

## 2023-11-23 DIAGNOSIS — I110 Hypertensive heart disease with heart failure: Secondary | ICD-10-CM | POA: Diagnosis not present

## 2023-11-23 DIAGNOSIS — Z7902 Long term (current) use of antithrombotics/antiplatelets: Secondary | ICD-10-CM | POA: Insufficient documentation

## 2023-11-23 DIAGNOSIS — R000 Tachycardia, unspecified: Secondary | ICD-10-CM | POA: Diagnosis not present

## 2023-11-23 DIAGNOSIS — R0602 Shortness of breath: Secondary | ICD-10-CM | POA: Diagnosis not present

## 2023-11-23 DIAGNOSIS — I10 Essential (primary) hypertension: Secondary | ICD-10-CM | POA: Diagnosis not present

## 2023-11-23 DIAGNOSIS — S2231XA Fracture of one rib, right side, initial encounter for closed fracture: Secondary | ICD-10-CM | POA: Diagnosis not present

## 2023-11-23 DIAGNOSIS — E876 Hypokalemia: Secondary | ICD-10-CM | POA: Diagnosis present

## 2023-11-23 DIAGNOSIS — J449 Chronic obstructive pulmonary disease, unspecified: Secondary | ICD-10-CM | POA: Insufficient documentation

## 2023-11-23 DIAGNOSIS — I700 Atherosclerosis of aorta: Secondary | ICD-10-CM | POA: Diagnosis not present

## 2023-11-23 DIAGNOSIS — I499 Cardiac arrhythmia, unspecified: Secondary | ICD-10-CM | POA: Diagnosis not present

## 2023-11-23 DIAGNOSIS — J4489 Other specified chronic obstructive pulmonary disease: Secondary | ICD-10-CM | POA: Diagnosis present

## 2023-11-23 DIAGNOSIS — I2510 Atherosclerotic heart disease of native coronary artery without angina pectoris: Secondary | ICD-10-CM | POA: Diagnosis not present

## 2023-11-23 LAB — TROPONIN I (HIGH SENSITIVITY): Troponin I (High Sensitivity): 26 ng/L — ABNORMAL HIGH (ref <18)

## 2023-11-23 LAB — CBC
HCT: 46.4 % (ref 39.0–52.0)
Hemoglobin: 15.3 g/dL (ref 13.0–17.0)
MCH: 30.1 pg (ref 26.0–34.0)
MCHC: 33 g/dL (ref 30.0–36.0)
MCV: 91.3 fL (ref 80.0–100.0)
Platelets: 220 10*3/uL (ref 150–400)
RBC: 5.08 MIL/uL (ref 4.22–5.81)
RDW: 14.9 % (ref 11.5–15.5)
WBC: 11 10*3/uL — ABNORMAL HIGH (ref 4.0–10.5)
nRBC: 0 % (ref 0.0–0.2)

## 2023-11-23 LAB — PROTIME-INR
INR: 1.6 — ABNORMAL HIGH (ref 0.8–1.2)
Prothrombin Time: 18.9 s — ABNORMAL HIGH (ref 11.4–15.2)

## 2023-11-23 LAB — BASIC METABOLIC PANEL
Anion gap: 10 (ref 5–15)
BUN: 13 mg/dL (ref 8–23)
CO2: 32 mmol/L (ref 22–32)
Calcium: 8.3 mg/dL — ABNORMAL LOW (ref 8.9–10.3)
Chloride: 98 mmol/L (ref 98–111)
Creatinine, Ser: 1.07 mg/dL (ref 0.61–1.24)
GFR, Estimated: >60 mL/min (ref >60)
Glucose, Bld: 110 mg/dL — ABNORMAL HIGH (ref 70–99)
Potassium: 3.3 mmol/L — ABNORMAL LOW (ref 3.5–5.1)
Sodium: 140 mmol/L (ref 135–145)

## 2023-11-23 MED ORDER — ALBUTEROL SULFATE HFA 108 (90 BASE) MCG/ACT IN AERS: 2.0000 | INHALATION_SPRAY | RESPIRATORY_TRACT | Status: DC | PRN

## 2023-11-23 NOTE — ED Provider Triage Note (Signed)
 Emergency Medicine Provider Triage Evaluation Note  Dennis Preston , a 72 y.o. male  was evaluated in triage.  Pt complains of shortness of breath, tachycardia.  Reports this began tonight.  States he has a history of tachycardia which led to hospital admission in the past.  Does not wear oxygen at home however arrives on oxygen.  Denies any chest pain that occurred tonight but states that he could feel when his heart was beating rapidly.  States he was short of breath at that time.  Denies any current shortness of breath.  Review of Systems  Positive:  Negative:   Physical Exam  BP (!) 172/57 (BP Location: Left Arm)   Pulse (!) 41   Temp 97.7 F (36.5 C) (Oral)   Resp 17   Ht 5\' 5"  (1.651 m)   Wt 73 kg   SpO2 99%   BMI 26.79 kg/m  Gen:   Awake, no distress   Resp:  Normal effort  MSK:   Moves extremities without difficulty  Other:    Medical Decision Making  Medically screening exam initiated at 9:25 PM.  Appropriate orders placed.  Dennis Preston was informed that the remainder of the evaluation will be completed by another provider, this initial triage assessment does not replace that evaluation, and the importance of remaining in the ED until their evaluation is complete.    Al Decant, PA-C 11/23/23 2126

## 2023-11-23 NOTE — ED Provider Notes (Signed)
 [place] EMERGENCY DEPARTMENT AT [facility] Provider Note   CSN: [id number] Arrival date & time: 11/23/23  1956     History {Add pertinent medical, surgical, social history, OB history to HPI:1} Chief Complaint  Patient presents with   Shortness of Breath   Tachycardia    Dennis Preston is a 73 y.o. male.  HPI     This is a 73 year old male who presents with concern for shortness of breath.  History of atrial flutter/fibrillation.  States that he felt like his heart rate was high all day because he has been short of breath.  He has not had any chest pain.  Has not noted any significant lower extremity swelling.  Takes Coumadin and Plavix.  Reports compliance with his medications.  He has oxygen which he wears only at night and placed oxygen for comfort while at home earlier today.  EMS noted heart rate of 140.  Appreciated sinus tachycardia.  Patient was given a liter of fluids.  No other intervention.  Home Medications Prior to Admission medications   Medication Sig Start Date End Date Taking? Authorizing Provider  atorvastatin (LIPITOR) 80 MG tablet Take 1 tablet (80 mg total) by mouth daily at 6 PM. Patient taking differently: Take 80 mg by mouth at bedtime. 07/10/15   Houston Siren, MD  clopidogrel (PLAVIX) 75 MG tablet Take 75 mg by mouth at bedtime. 04/18/16   [provider]  colchicine 0.6 MG tablet Take 0.6 mg by mouth daily as needed (gout flare).    [provider]  diazepam (VALIUM) 5 MG tablet Take 1 tablet (5 mg total) by mouth at bedtime. 08/26/23   Erick Alley, DO  diltiazem (CARDIZEM CD) 120 MG 24 hr capsule Take by mouth. 02/25/23   [provider]  ELIQUIS 5 MG TABS tablet Take 5 mg by mouth 2 (two) times daily. 02/20/22   [provider]  Fluticasone-Umeclidin-Vilant (TRELEGY ELLIPTA) 100-62.5-25 MCG/INH AEPB Inhale 1 puff into the lungs every other day. 01/27/20   [provider]  furosemide (LASIX) 40  MG tablet Take 40 mg in the morning and 20 mg (1/2 tablet) in the evening 08/26/23   Erick Alley, DO  levalbuterol (XOPENEX) 1.25 MG/3ML nebulizer solution Take 1.25 mg by nebulization every 6 (six) hours as needed for wheezing.    [provider]  OXYGEN Inhale 2 L into the lungs at bedtime as needed (02 sats < 90).    [provider]      Allergies    Hibiclens [chlorhexidine gluconate]    Review of Systems   Review of Systems  Constitutional:  Negative for fever.  Respiratory:  Positive for shortness of breath.   Cardiovascular:  Positive for palpitations. Negative for chest pain and leg swelling.  Gastrointestinal:  Negative for abdominal pain.  All other systems reviewed and are negative.   Physical Exam Updated Vital Signs BP (!) 143/62   Pulse 67   Temp 97.7 F (36.5 C) (Oral)   Resp 19   Ht 1.651 m (5\' 5" )   Wt 73 kg   SpO2 97%   BMI 26.79 kg/m  Physical Exam Vitals and nursing note reviewed.  Constitutional:      Appearance: He is well-developed. He is not ill-appearing.  HENT:     Head: Normocephalic and atraumatic.  Eyes:     Pupils: Pupils are equal, round, and reactive to light.  Cardiovascular:     Rate and Rhythm: Normal rate.  Heart sounds: Normal heart sounds. No murmur heard.    Comments: Irregularly irregular rhythm Pulmonary:     Effort: Pulmonary effort is normal. No respiratory distress.     Breath sounds: Normal breath sounds. No wheezing.  Abdominal:     General: Bowel sounds are normal.     Palpations: Abdomen is soft.     Tenderness: There is no abdominal tenderness. There is no rebound.  Musculoskeletal:     Cervical back: Neck supple.     Right lower leg: No edema.     Left lower leg: No edema.  Lymphadenopathy:     Cervical: No cervical adenopathy.  Skin:    General: Skin is warm and dry.  Neurological:     Mental Status: He is alert and oriented to person, place, and time.  Psychiatric:        Mood and  Affect: Mood normal.     ED Results / Procedures / Treatments   Labs (all labs ordered are listed, but only abnormal results are displayed) Labs Reviewed  BASIC METABOLIC PANEL - Abnormal; Notable for the following components:      Result Value   Potassium 3.3 (*)    Glucose, Bld 110 (*)    Calcium 8.3 (*)    All other components within normal limits  CBC - Abnormal; Notable for the following components:   WBC 11.0 (*)    All other components within normal limits  PROTIME-INR - Abnormal; Notable for the following components:   Prothrombin Time 18.9 (*)    INR 1.6 (*)    All other components within normal limits  TROPONIN I (HIGH SENSITIVITY) - Abnormal; Notable for the following components:   Troponin I (High Sensitivity) 26 (*)    All other components within normal limits  BRAIN NATRIURETIC PEPTIDE  TROPONIN I (HIGH SENSITIVITY)    EKG EKG Interpretation Date/Time:  Monday November 23 2023 23:22:13 EST Ventricular Rate:  67 PR Interval:    QRS Duration:  154 QT Interval:  590 QTC Calculation: 623 R Axis:   102  Text Interpretation: Atrial flutter with predominant 4:1 AV block RBBB and LPFB Abnormal T, consider ischemia, inferior leads Confirmed by Ross Marcus ([id number]) on 11/23/2023 11:31:38 PM  Radiology DG Chest Port 1 View Result Date: 11/23/2023 CLINICAL DATA:  Atrial fibrillation EXAM: PORTABLE CHEST 1 VIEW COMPARISON:  Chest x-ray 08/24/2023 FINDINGS: The heart size and mediastinal contours are within normal limits. Both lungs are clear. Sternotomy wires are again noted as well as surgical clips in the mediastinum and vascular stent. No acute osseous abnormality. There is a healed right seventh rib fracture. IMPRESSION: No active disease. Electronically Signed   By: Darliss Cheney M.D.   On: 11/23/2023 22:53    Procedures Procedures  {Document cardiac monitor, telemetry assessment procedure when appropriate:1}  Medications Ordered in ED Medications   albuterol (VENTOLIN HFA) 108 (90 Base) MCG/ACT inhaler 2 puff (has no administration in time range)    ED Course/ Medical Decision Making/ A&P   {   Click here for ABCD2, HEART and other calculatorsREFRESH Note before signing :1}                              Medical Decision Making Amount and/or Complexity of Data Reviewed Labs: ordered. Radiology: ordered.  Risk Prescription drug management.   ***  {Document critical care time when appropriate:1} {Document review of labs and clinical decision tools ie heart  score, Chads2Vasc2 etc:1}  {Document your independent review of radiology images, and any outside records:1} {Document your discussion with family members, caretakers, and with consultants:1} {Document social determinants of health affecting pt's care:1} {Document your decision making why or why not admission, treatments were needed:1} Final Clinical Impression(s) / ED Diagnoses Final diagnoses:  None    Rx / DC Orders ED Discharge Orders     None

## 2023-11-23 NOTE — ED Triage Notes (Signed)
 Pt from home short of breath all day. Has o2 at home as needed for COPD. Pt put 2L on . Heart rate was 140 when ems got there. Sinus tach at 140. Nausea  with zofran given .   NS  HR 110  122/70 110 98% 2L Cbg 150

## 2023-11-24 ENCOUNTER — Emergency Department (HOSPITAL_COMMUNITY): Payer: Medicare HMO

## 2023-11-24 DIAGNOSIS — F411 Generalized anxiety disorder: Secondary | ICD-10-CM

## 2023-11-24 DIAGNOSIS — I484 Atypical atrial flutter: Secondary | ICD-10-CM | POA: Diagnosis not present

## 2023-11-24 DIAGNOSIS — I2510 Atherosclerotic heart disease of native coronary artery without angina pectoris: Secondary | ICD-10-CM

## 2023-11-24 DIAGNOSIS — J4489 Other specified chronic obstructive pulmonary disease: Secondary | ICD-10-CM

## 2023-11-24 DIAGNOSIS — E785 Hyperlipidemia, unspecified: Secondary | ICD-10-CM

## 2023-11-24 DIAGNOSIS — E7800 Pure hypercholesterolemia, unspecified: Secondary | ICD-10-CM | POA: Diagnosis not present

## 2023-11-24 DIAGNOSIS — R000 Tachycardia, unspecified: Secondary | ICD-10-CM | POA: Diagnosis not present

## 2023-11-24 DIAGNOSIS — I5032 Chronic diastolic (congestive) heart failure: Secondary | ICD-10-CM | POA: Diagnosis not present

## 2023-11-24 DIAGNOSIS — I700 Atherosclerosis of aorta: Secondary | ICD-10-CM | POA: Diagnosis not present

## 2023-11-24 DIAGNOSIS — J432 Centrilobular emphysema: Secondary | ICD-10-CM | POA: Diagnosis not present

## 2023-11-24 DIAGNOSIS — I10 Essential (primary) hypertension: Secondary | ICD-10-CM | POA: Diagnosis not present

## 2023-11-24 DIAGNOSIS — E876 Hypokalemia: Secondary | ICD-10-CM | POA: Diagnosis not present

## 2023-11-24 DIAGNOSIS — G4734 Idiopathic sleep related nonobstructive alveolar hypoventilation: Secondary | ICD-10-CM

## 2023-11-24 DIAGNOSIS — I2583 Coronary atherosclerosis due to lipid rich plaque: Secondary | ICD-10-CM | POA: Diagnosis not present

## 2023-11-24 DIAGNOSIS — I4892 Unspecified atrial flutter: Secondary | ICD-10-CM | POA: Diagnosis not present

## 2023-11-24 LAB — MAGNESIUM: Magnesium: 2 mg/dL (ref 1.7–2.4)

## 2023-11-24 LAB — BRAIN NATRIURETIC PEPTIDE: B Natriuretic Peptide: 42.4 pg/mL (ref 0.0–100.0)

## 2023-11-24 LAB — TSH: TSH: 1.432 u[IU]/mL (ref 0.350–4.500)

## 2023-11-24 LAB — TROPONIN I (HIGH SENSITIVITY): Troponin I (High Sensitivity): 18 ng/L — ABNORMAL HIGH (ref <18)

## 2023-11-24 MED ORDER — DILTIAZEM HCL ER COATED BEADS 240 MG PO CP24
240.0000 mg | ORAL_CAPSULE | Freq: Every day | ORAL | Status: DC
  Administered 2023-11-24: 240 mg via ORAL
  Filled 2023-11-24: qty 1

## 2023-11-24 MED ORDER — DILTIAZEM HCL ER COATED BEADS 180 MG PO CP24
360.0000 mg | ORAL_CAPSULE | Freq: Every day | ORAL | Status: DC
  Administered 2023-11-25 – 2023-11-26 (×2): 360 mg via ORAL
  Filled 2023-11-24 (×2): qty 2

## 2023-11-24 MED ORDER — CLOPIDOGREL BISULFATE 75 MG PO TABS
75.0000 mg | ORAL_TABLET | Freq: Every day | ORAL | Status: DC
  Administered 2023-11-24 – 2023-11-25 (×2): 75 mg via ORAL
  Filled 2023-11-24 (×2): qty 1

## 2023-11-24 MED ORDER — POTASSIUM CHLORIDE CRYS ER 20 MEQ PO TBCR
40.0000 meq | EXTENDED_RELEASE_TABLET | Freq: Once | ORAL | Status: AC
  Administered 2023-11-24: 40 meq via ORAL
  Filled 2023-11-24: qty 2

## 2023-11-24 MED ORDER — WARFARIN - PHARMACIST DOSING INPATIENT: Freq: Every day | Status: DC

## 2023-11-24 MED ORDER — ONDANSETRON HCL 4 MG/2ML IJ SOLN: 4.0000 mg | Freq: Four times a day (QID) | INTRAMUSCULAR | Status: DC | PRN

## 2023-11-24 MED ORDER — SODIUM CHLORIDE 0.9% FLUSH
3.0000 mL | Freq: Two times a day (BID) | INTRAVENOUS | Status: DC
  Administered 2023-11-24 – 2023-11-26 (×5): 3 mL via INTRAVENOUS

## 2023-11-24 MED ORDER — LEVALBUTEROL HCL 1.25 MG/0.5ML IN NEBU: 1.2500 mg | INHALATION_SOLUTION | Freq: Three times a day (TID) | RESPIRATORY_TRACT | Status: DC | PRN

## 2023-11-24 MED ORDER — WARFARIN SODIUM 3 MG PO TABS
3.0000 mg | ORAL_TABLET | Freq: Once | ORAL | Status: AC
  Administered 2023-11-24: 3 mg via ORAL
  Filled 2023-11-24: qty 1

## 2023-11-24 MED ORDER — ACETAMINOPHEN 650 MG RE SUPP: 650.0000 mg | Freq: Four times a day (QID) | RECTAL | Status: DC | PRN

## 2023-11-24 MED ORDER — DIAZEPAM 5 MG PO TABS
2.5000 mg | ORAL_TABLET | Freq: Two times a day (BID) | ORAL | Status: DC | PRN
  Administered 2023-11-24 – 2023-11-25 (×2): 2.5 mg via ORAL
  Filled 2023-11-24 (×2): qty 1

## 2023-11-24 MED ORDER — ACETAMINOPHEN 325 MG PO TABS: 650.0000 mg | ORAL_TABLET | Freq: Four times a day (QID) | ORAL | Status: DC | PRN

## 2023-11-24 MED ORDER — FUROSEMIDE 40 MG PO TABS
40.0000 mg | ORAL_TABLET | Freq: Every day | ORAL | Status: DC
  Administered 2023-11-25 – 2023-11-26 (×2): 40 mg via ORAL
  Filled 2023-11-24 (×2): qty 1

## 2023-11-24 MED ORDER — SPIRONOLACTONE 12.5 MG HALF TABLET
12.5000 mg | ORAL_TABLET | Freq: Once | ORAL | Status: AC
Start: 2023-11-24 — End: 2023-11-24
  Administered 2023-11-24: 12.5 mg via ORAL
  Filled 2023-11-24: qty 1

## 2023-11-24 MED ORDER — DILTIAZEM HCL 25 MG/5ML IV SOLN
10.0000 mg | Freq: Once | INTRAVENOUS | Status: DC
  Filled 2023-11-24: qty 5

## 2023-11-24 MED ORDER — SODIUM CHLORIDE 0.9% FLUSH: 3.0000 mL | INTRAVENOUS | Status: DC | PRN

## 2023-11-24 MED ORDER — ONDANSETRON HCL 4 MG PO TABS: 4.0000 mg | ORAL_TABLET | Freq: Four times a day (QID) | ORAL | Status: DC | PRN

## 2023-11-24 MED ORDER — FLUTICASONE FUROATE-VILANTEROL 100-25 MCG/ACT IN AEPB
1.0000 | INHALATION_SPRAY | Freq: Every day | RESPIRATORY_TRACT | Status: DC
  Administered 2023-11-25 – 2023-11-26 (×2): 1 via RESPIRATORY_TRACT
  Filled 2023-11-24 (×3): qty 28

## 2023-11-24 MED ORDER — UMECLIDINIUM BROMIDE 62.5 MCG/ACT IN AEPB
1.0000 | INHALATION_SPRAY | Freq: Every day | RESPIRATORY_TRACT | Status: DC
  Administered 2023-11-25 – 2023-11-26 (×2): 1 via RESPIRATORY_TRACT
  Filled 2023-11-24 (×2): qty 7

## 2023-11-24 MED ORDER — IOHEXOL 350 MG/ML SOLN
75.0000 mL | Freq: Once | INTRAVENOUS | Status: AC | PRN
  Administered 2023-11-24: 75 mL via INTRAVENOUS

## 2023-11-24 MED ORDER — SODIUM CHLORIDE 0.9 % IV SOLN: 250.0000 mL | INTRAVENOUS | Status: AC | PRN

## 2023-11-24 MED ORDER — ATORVASTATIN CALCIUM 80 MG PO TABS
80.0000 mg | ORAL_TABLET | Freq: Every day | ORAL | Status: DC
  Administered 2023-11-24 – 2023-11-25 (×2): 80 mg via ORAL
  Filled 2023-11-24: qty 1
  Filled 2023-11-24: qty 2

## 2023-11-24 NOTE — Care Management Obs Status (Addendum)
 YeMEDICARE OBSERVATION STATUS NOTIFICATION   Patient Details  Name: Dennis Preston MRN: [id number] Date of Birth: 05/14/1951   Medicare Observation Status Notification Given:   Yes  IMichel Bickers, RN, verbally reviewed observation notice, Gennaro Africa gives verbal consent for signature to Michel Bickers, RN.   11/24/2023, 6:43 PM.  Provided patient with signed copy.      Michel Bickers, RN 11/24/2023, 6:45 PM

## 2023-11-24 NOTE — Assessment & Plan Note (Signed)
-  No acute exacerbation currently appreciated -Continue inpatient home equivalent for Trelegy Ellipta and as needed Xopenex -Continue outpatient follow-up with pulmonology service.

## 2023-11-24 NOTE — H&P (Signed)
 History and Physical    Patient: Dennis Preston:[id number] DOB: 07-19-1951 DOA: 11/23/2023 DOS: the patient was seen and examined on 11/24/2023 PCP: Barbette Reichmann, MD  Patient coming from: Home  Chief Complaint:  Chief Complaint  Patient presents with   Shortness of Breath   Tachycardia   HPI: Dennis Preston is a 73 y.o. male with medical history significant of nocturnal hypoxia, hypertension, hyperlipidemia, COPD, anxiety and paroxysmal atrial fibrillation/Atrial flutter; who presented to the hospital secondary to shortness of breath and palpitations.  Patient reports symptoms present for 24-36 hrs. prior to admission.  He initially thought that his symptoms were driven by underlying COPD but given no relief by the use of home inhalers and Xopenex nebulizer decided to call 911 and come to the hospital for further evaluation and management.   At time of EMS arrival patient heart rate in the 140s range, SVT per tracing report.   Patient reported no fever, no nausea, no vomiting, no chest pain, no abdominal pain, no dysuria, no hematuria, no hematochezia, no melena, no focal weaknesses or any other complaints.  In the ED chest x-ray demonstrated no acute cardiopulmonary process; troponin in the 25 range and normal BNP; given hypoxia CTA of the chest was done demonstrating no pulmonary embolism or any acute pulmonary process.    Transient atrial flutter with associated hypoxia on exertion appreciated.  Patient INR subtherapeutic and he was not cardioverted.  Patient received treatment with Cardizem and oxygen supplementation.  Asymptomatic, hemodynamically stable and in sinus rhythm at rest at time of my evaluation.  Review of Systems: As mentioned in the history of present illness. All other systems reviewed and are negative. 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, SVG-OM1, SVG-PDA, SVG-PL   Hyperlipemia    Hypertension    Leucocytosis    Liver dysfunction    Long term current use of clopidogrel    NSTEMI (non-ST elevated myocardial infarction) (HCC)    approx 2016   Peripheral artery disease (HCC)    legs   Tobacco abuse    Wears dentures    full upper and lower   Past Surgical History:  Procedure Laterality Date   APPLICATION OF WOUND VAC Right 06/22/2020   Procedure: APPLICATION OF WOUND VAC;  Surgeon: Annice Needy, MD;  Location: [facility] ORS;  Service: Vascular;  Laterality: Right;   CARDIAC CATHETERIZATION N/A 07/10/2015   Procedure: Right/Left Heart Cath and Coronary Angiography;  Surgeon: Alwyn Pea, MD;  Location: [facility] INVASIVE CV LAB;  Service: Cardiovascular;  Laterality: N/A;   CARDIOVERSION N/A 10/14/2022   Procedure: CARDIOVERSION;  Surgeon: Alwyn Pea, MD;  Location: [facility];  Service: Cardiovascular;  Laterality: N/A;   CORONARY ARTERY BYPASS GRAFT  07/13/2015   [facility].  4 vessel   ENDARTERECTOMY Right 01/02/2021   Procedure: ENDARTERECTOMY CAROTID;  Surgeon: Annice Needy, MD;  Location: [facility] ORS;  Service: Vascular;  Laterality: Right;   ENDARTERECTOMY FEMORAL Bilateral 05/23/2020   Procedure: ENDARTERECTOMY FEMORAL BILATERAL SUPERFICIAL FEMORAL ARTERY STENTS ;  Surgeon: Renford Dills, MD;  Location: [facility] OR[facility];  Service: Vascular;  Laterality: Bilateral;   FALSE ANEURYSM REPAIR Left 07/22/2020   Procedure: REPAIR LEFT FEMORAL PSEUDOANUERYSM;  Surgeon: Bertram Denver, MD;  Location: [facility];  Service: Vascular;  Laterality: Left;   INSERTION OF ILIAC STENT Left 05/23/2020   Procedure: INSERTION OF ILIAC STENT;  Surgeon: Renford Dills, MD;  Location: [facility];  Service: Vascular;  Laterality: Left;   LEFT HEART CATH AND  CORONARY ANGIOGRAPHY N/A 09/16/2022   Procedure: LEFT HEART CATH AND CORONARY ANGIOGRAPHY;  Surgeon: Lennette Bihari, MD;  Location: [facility] INVASIVE CV LAB;  Service: Cardiovascular;  Laterality: N/A;   LEFT HEART CATH AND CORS/GRAFTS ANGIOGRAPHY N/A 01/23/2020   Procedure: LEFT HEART CATH AND CORS/GRAFTS ANGIOGRAPHY;  Surgeon: Alwyn Pea, MD;  Location: [facility] IN[facility] CV [facility];  Service: Cardiovascular;  Laterality: N/A;   LEFT HEART CATH AND CORS/GRAFTS ANGIOGRAPHY N/A 02/18/2022   Procedure: LEFT HEART CATH AND CORS/GRAFTS ANGIOGRAPHY;  Surgeon: Armando Reichert, MD;  Location: [facility] INVASIVE CV LAB;  Service: Cardiovascular;  Laterality: N/A;   LOWER EXTREMITY ANGIOGRAPHY Right 04/03/2020   Procedure: LOWER EXTREMITY ANGIOGRAPHY;  Surgeon: Renford Dills, MD;  Location: [facility] INVASIVE CV LAB;  Service: Cardiovascular;  Laterality: Right;   OLECRANON BURSECTOMY Left 05/06/2019   Procedure: OLECRANON BURSECTOMY AND DEBRIDEMENT;  Surgeon: Signa Kell, MD;  Location: [facility];  Service: Orthopedics;  Laterality: Left;   WOUND DEBRIDEMENT Right 06/22/2020   Procedure: DEBRIDEMENT WOUND RIGHT GROIN;  Surgeon: Annice Needy, MD;  Location: [facility] OR[facility];  Service: Vascular;  Laterality: Right;   Social History:  reports that he has quit smoking. His smoking use included cigarettes. He has a 135 pack-year smoking history. He has never used smokeless tobacco. He reports that he does not currently use alcohol. He reports that he does not use drugs.  Allergies  Allergen Reactions   Hibiclens [Chlorhexidine Gluconate] Other (See Comments)    Unknown reaction    Family History  Problem Relation Age of Onset   Depression Sister     Prior to Admission medications   Medication Sig Start Date End Date Taking? Authorizing Provider  atorvastatin (LIPITOR) 80 MG tablet Take 1 tablet (80 mg total) by mouth daily at 6 PM. Patient taking differently: Take 80 mg by mouth at bedtime. 07/10/15   Houston Siren,  MD  clopidogrel (PLAVIX) 75 MG tablet Take 75 mg by mouth at bedtime. 04/18/16   [provider]  colchicine 0.6 MG tablet Take 0.6 mg by mouth daily as needed (gout flare).    [provider]  diazepam (VALIUM) 5 MG tablet Take 1 tablet (5 mg total) by mouth at bedtime. 08/26/23   Erick Alley, DO  diltiazem (CARDIZEM CD) 120 MG 24 hr capsule Take by mouth. 02/25/23   [provider]  diltiazem (CARDIZEM CD) 240 MG 24 hr capsule Take 240 mg by mouth daily. 10/01/23   [provider]  ELIQUIS 5 MG TABS tablet Take 5 mg by mouth 2 (two) times daily. 02/20/22   [provider]  fluticasone furoate-vilanterol (BREO ELLIPTA) 100-25 MCG/ACT AEPB Inhale 1 puff into the lungs daily. 11/06/23   [provider]  Fluticasone-Umeclidin-Vilant (TRELEGY ELLIPTA) 100-62.5-25 MCG/INH AEPB Inhale 1 puff into the lungs every other day. 01/27/20   [provider]  furosemide (LASIX) 40 MG tablet Take 40 mg in the morning and 20 mg (1/2 tablet) in the evening 08/26/23   Erick Alley, DO  levalbuterol Pauline Aus) 1.25 MG/3ML nebulizer solution Take 1.25 mg by nebulization  every 6 (six) hours as needed for wheezing.    [provider]  OXYGEN Inhale 2 L into the lungs at bedtime as needed (02 sats < 90).    [provider]  spironolactone (ALDACTONE) 25 MG tablet Take 12.5 mg by mouth once. 04/08/23   [provider]  tiotropium (SPIRIVA) 18 MCG inhalation capsule Place 18 mcg into inhaler and inhale daily. 11/06/23 11/05/24  [provider]  warfarin (COUMADIN) 1 MG tablet Take 1.5 mg by mouth one time only at 4 PM. 11/10/23   [provider]  warfarin (COUMADIN) 5 MG tablet Take 5 mg by mouth one time only at 4 PM. 10/06/23 10/05/24  [provider]    Physical Exam: Vitals:   11/24/23 0536 11/24/23 0800 11/24/23 0830 11/24/23 0915  BP:  115/63 110/67 (!) 108/55  Pulse:  66 66 64  Resp:  18 18 17   Temp: 98.3 F  (36.8 C)     TempSrc: Oral     SpO2:  96% 100% 96%  Weight:      Height:       General exam: Alert, awake, oriented x 3; no chest pain, no fever. Respiratory system: Clear to auscultation. Respiratory effort normal.  Good saturation on 2 L supplementation. Cardiovascular system:RRR. No rubs or gallops; no JVD. Gastrointestinal system: Abdomen is nondistended, soft and nontender. No organomegaly or masses felt. Normal bowel sounds heard. Central nervous system: No focal neurological deficits. Extremities: No cyanosis, clubbing or edema. Skin: No petechiae. Psychiatry: Judgement and insight appear normal. Mood & affect appropriate.   Data Reviewed: BNP 42.4 Troponin: 26 PT/INR: 18.9 / 1.6 respectively CBC: WBCs 11.0, hemoglobin 15.3 and platelet count 2 20K Basic metabolic panel: Sodium 140, potassium 3.3, chloride 98, bicarb 32, BUN 13, creatinine 1.07 and GFR >60   Assessment and Plan: Atrial flutter with rapid ventricular response (HCC) -Patient with history of paroxysmal atrial fibrillation/atrial flutter; presenting with palpitations and associated shortness of breath/hypoxia. -No chest pain; positive dyspnea on exertion. -Troponin and BNP stable -Patient's Coumadin level subtherapeutic; pharmacy service consulted to assist with management of Coumadin dosage. -Cardiology service has been consulted, checking TSH and 2D echo. -At rest patient's back in sinus rhythm with controlled Heart Rate; but with exertion experiencing increased in his HR to 140-150's range and associated SOB. -daily cardizem 240mg  ER has been continued. -will follow any further cardiology service rec's/intervention.  Anxiety state -Continue as needed Valium.  COPD (chronic obstructive pulmonary disease) with chronic bronchitis (HCC) -No acute exacerbation currently appreciated -Continue inpatient home equivalent for Trelegy Ellipta and as needed Xopenex -Continue outpatient follow-up with pulmonology  service.  Essential hypertension -Blood pressure currently stable and demonstrating good control -Heart healthy/low-sodium diet discussed with patient -Continue treatment with Cardizem, Aldactone and Lasix. -Follow vital signs.  Hypokalemia -Will replete electrolytes and follow trend -Goal is for potassium above 4 -Checking magnesium level.  Hyperlipidemia -Heart healthy/low-fat diet discussed with patient -Continue Lipitor.  Chronic heart failure with preserved ejection fraction (HCC) -Stable and compensated -2D echo will be updated -Follow daily weights and strict I's and O's -Continue treatment with Lasix and Aldactone.  Nocturnal hypoxia -Patient reports using 2 L nasal cannula supplementation at bedtime on daily basis; This will be continue while inpatient.     Advance Care Planning:   Code Status: Full Code   Consults: Cardiology service  Family Communication: No family at bedside.  Severity of Illness: The appropriate patient status for this patient is OBSERVATION. Observation status  is judged to be reasonable and necessary in order to provide the required intensity of service to ensure the patient's safety. The patient's presenting symptoms, physical exam findings, and initial radiographic and laboratory data in the context of their medical condition is felt to place them at decreased risk for further clinical deterioration. Furthermore, it is anticipated that the patient will be medically stable for discharge from the hospital within 2 midnights of admission.   Author: Vassie Loll, MD 11/24/2023 9:51 AM  For on call review [email or web address].

## 2023-11-24 NOTE — ED Notes (Signed)
 Pt transported to CT ?

## 2023-11-24 NOTE — Assessment & Plan Note (Signed)
-  Stable and compensated -2D echo will be updated -Follow daily weights and strict I's and O's -Continue treatment with Lasix and Aldactone.

## 2023-11-24 NOTE — Consult Note (Addendum)
 Cardiology Consultation   Patient ID: Dennis Preston MRN: [id number]; DOB: Sep 04, 1951  Admit date: 11/23/2023 Date of Consult: 11/24/2023  PCP:  Barbette Reichmann, MD   [facility] Providers Cardiologist:  None [employer] Electrophysiologist:  Lanier Prude, MD  {   Patient Profile:   Dennis Preston is a 73 y.o. male with a hx of CAD status post CABG, persistent atrial flutter/fib, wide-complex tachycardia/SVT, chronic HFpEF, COPD chronically on supplemental oxygen (follows with pulmonology), hypertension, PAD status post left subclavian stent 2023, carotid artery stenosis status post CEA, hyperlipidemia, alcohol/tobacco abuse, OSA not on CPAP  who is being seen 11/24/2023 for the evaluation of atrial flutter at the request of Dr. Gwenlyn Perking.  History of Present Illness:   Dennis Preston patient is followed by [facility] clinic.  October 2023 admitted for acute on chronic respiratory failure secondary to pneumonia requiring mechanical ventilation.  Course complicated by concern for STEMI and VT but ultimately felt to have repolarization abnormality and SVT with aberrancy. Cath showed SVG-RPDA/RPL, patent PVG-ramus intermedius, known occluded SVG-Lcx/OM, patent LIMA-LAD that filled primarily in retrograde fashion from native coronary artery. 95% ostial left subclavian stenosis with subsequent stenting of left subclavian artery.  His arrhythmia was noted again in December 2023 hospitalization for acute on chronic respiratory failure requiring mechanical ventilation.  Also was NSTEMI with recurrent wide-complex tachycardia.  Dr. Nelly Laurence felt to have aberrant conduction of SVT but not VT. Again heart catheterization showing unchanged anatomy, troponin elevation felt to be demand ischemia.  Discharged on amiodarone but this has been discontinued since.  He was last admitted in November 2024 for atrial flutter with our recommendations to cardiovert in the next 3 to 4 weeks on Cardizem and Eliquis.   He followed up with Dr. Juliann Pares December 2024, still noted to be in atrial flutter with heart rates in the 140s.  He had doubled his diltiazem to 240 mg and eventually transition from Eliquis to Coumadin due to cost.  He did not report explicit plans for cardioversion.  Now admitted yesterday for atrial flutter RVR with heart rates in the 140s reported by EMS.  Reportedly did not feel good all day, mildly hypoxic with ambulation, slight shortness of breath.  Here in the emergency room he has controlled ventricular rates in the 60s, asymptomatic at this time.  Denies any chest pain, shortness of breath, peripheral edema, palpitations, dizziness.  Apart from yesterday's events he is doing well.  Ambulates and able to walk throughout the grocery store without any difficulties.  He lives at home with his wife, reports some snoring but does not tolerate CPAP mask.  Reports compliancy with all of his medications.  TSH negative in November.  CTA negative for PE.  Emphysema noted.  Potassium 3.3 (supplemented).  Normal renal function.  BNP 42.  Troponin 26.  BBC 11.  Hemoglobin 15.3.  INR 1.6.  And persistently subtherapeutic   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, SVG-OM1, SVG-PDA, SVG-PL   Hyperlipemia    Hypertension    Leucocytosis    Liver dysfunction    Long term current use of  clopidogrel    NSTEMI (non-ST elevated myocardial infarction) (HCC)    approx 2016   Peripheral artery disease (HCC)    legs   Tobacco abuse    Wears dentures    full upper and lower    Past Surgical History:  Procedure Laterality Date   APPLICATION OF WOUND VAC Right 06/22/2020   Procedure:  APPLICATION OF WOUND VAC;  Surgeon: Annice Needy, MD;  Location: [facility] ORS;  Service: Vascular;  Laterality: Right;   CARDIAC CATHETERIZATION N/A 07/10/2015   Procedure: Right/Left Heart Cath and Coronary Angiography;  Surgeon: Alwyn Pea, MD;  Location: [facility] INVASIVE CV LAB;  Service: Cardiovascular;  Laterality: N/A;   CARDIOVERSION N/A 10/14/2022   Procedure: CARDIOVERSION;  Surgeon: Alwyn Pea, MD;  Location: [facility] ORS;  Service: Cardiovascular;  Laterality: N/A;   CORONARY ARTERY BYPASS GRAFT  07/13/2015   [facility].  4 vessel   ENDARTERECTOMY Right 01/02/2021   Procedure: ENDARTERECTOMY CAROTID;  Surgeon: Annice Needy, MD;  Location: [facility] ORS;  Service: Vascular;  Laterality: Right;   ENDARTERECTOMY FEMORAL Bilateral 05/23/2020   Procedure: ENDARTERECTOMY FEMORAL BILATERAL SUPERFICIAL FEMORAL ARTERY STENTS ;  Surgeon: Renford Dills, MD;  Location: [facility] ORS;  Service: Vascular;  Laterality: Bilateral;   FALSE ANEURYSM REPAIR Left 07/22/2020   Procedure: REPAIR LEFT FEMORAL PSEUDOANUERYSM;  Surgeon: Bertram Denver, MD;  Location: [facility] ORS;  Service: Vascular;  Laterality: Left;   INSERTION OF ILIAC STENT Left 05/23/2020   Procedure: INSERTION OF ILIAC STENT;  Surgeon: Renford Dills, MD;  Location: [facility] ORS;  Service: Vascular;  Laterality: Left;   LEFT HEART CATH AND CORONARY ANGIOGRAPHY N/A 09/16/2022   Procedure: LEFT HEART CATH AND CORONARY ANGIOGRAPHY;  Surgeon: Lennette Bihari, MD;  Location: [facility] INVASIVE CV LAB;  Service: Cardiovascular;  Laterality: N/A;   LEFT HEART CATH AND CORS/GRAFTS ANGIOGRAPHY N/A 01/23/2020   Procedure: LEFT HEART CATH AND CORS/GRAFTS ANGIOGRAPHY;  Surgeon: Alwyn Pea, MD;  Location: [facility] INVASIVE CV LAB;  Service: Cardiovascular;  Laterality: N/A;   LEFT HEART CATH AND CORS/GRAFTS ANGIOGRAPHY N/A 02/18/2022   Procedure: LEFT HEART CATH AND CORS/GRAFTS ANGIOGRAPHY;  Surgeon: Armando Reichert, MD;  Location: [facility] INVASIVE CV LAB;  Service:  Cardiovascular;  Laterality: N/A;   LOWER EXTREMITY ANGIOGRAPHY Right 04/03/2020   Procedure: LOWER EXTREMITY ANGIOGRAPHY;  Surgeon: Renford Dills, MD;  Location: [facility] INVASIVE CV LAB;  Service: Cardiovascular;  Laterality: Right;   OLECRANON BURSECTOMY Left 05/06/2019   Procedure: OLECRANON BURSECTOMY AND DEBRIDEMENT;  Surgeon: Signa Kell, MD;  Location: [facility] ORS;  Service: Orthopedics;  Laterality: Left;   WOUND DEBRIDEMENT Right 06/22/2020   Procedure: DEBRIDEMENT WOUND RIGHT GROIN;  Surgeon: Annice Needy, MD;  Location: [facility] ORS;  Service: Vascular;  Laterality: Right;     Inpatient Medications: Scheduled Meds:  atorvastatin  80 mg Oral q1800   clopidogrel  75 mg Oral QHS   diltiazem  240 mg Oral Daily   diltiazem  10 mg Intravenous Once   fluticasone furoate-vilanterol  1 puff Inhalation Daily   [START ON 11/25/2023] furosemide  40 mg Oral Daily   potassium chloride  40 mEq Oral Once   sodium chloride flush  3 mL Intravenous Q12H   spironolactone  12.5 mg Oral Once   umeclidinium bromide  1 puff Inhalation Daily   Continuous Infusions:  sodium chloride     PRN Meds: sodium chloride, acetaminophen **OR** acetaminophen, diazepam, levalbuterol, ondansetron **OR** ondansetron (ZOFRAN) IV, sodium chloride flush  Allergies:  Allergies  Allergen Reactions   Hibiclens [Chlorhexidine Gluconate] Other (See Comments)    Unknown reaction    Social History:   Social History   Socioeconomic History   Marital status: Married    Spouse name: Not on file   Number of children: Not on file   Years of education: Not on file   Highest education level: Not on file  Occupational History   Not on file  Tobacco Use   Smoking status: Former    Current packs/day: 2.50    Average packs/day: 2.5 packs/day for 54.0 years (135.0 ttl pk-yrs)    Types: Cigarettes   Smokeless tobacco: Never   Tobacco comments:    0.5PPD 09/17/21  Vaping Use   Vaping status: Never Used  Substance and  Sexual Activity   Alcohol use: Not Currently    Comment: quit over 15 yrs ago   Drug use: Never   Sexual activity: Not on file  Other Topics Concern   Not on file  Social History Narrative   Not on file   Social Drivers of Health   Financial Resource Strain: Low Risk  (04/28/2023)   Received from [facility]   Overall Financial Resource Strain (CARDIA)    Difficulty of Paying Living Expenses: Not hard at all  Food Insecurity: No Food Insecurity (08/24/2023)   Hunger Vital Sign    Worried About Running Out of Food in the Last Year: Never true    Ran Out of Food in the Last Year: Never true  Transportation Needs: No Transportation Needs (08/24/2023)   PRAPARE - Administrator, Civil Service (Medical): No    Lack of Transportation (Non-Medical): No  Physical Activity: Insufficiently Active (02/24/2020)   Received from [facility], [facility]   Exercise Vital Sign    Days of Exercise per Week: 2 days    Minutes of Exercise per Session: 40 min  Stress: No Stress Concern Present (02/24/2020)   Received from [facility], [facility]   [employer] of Occupational Health - Occupational Stress Questionnaire    Feeling of Stress : Not at all  Social Connections: Moderately Isolated (02/24/2020)   Received from [facility], [facility]   Social Connection and Isolation Panel [NHANES]    Frequency of Communication with Friends and Family: Never    Frequency of Social Gatherings with Friends and Family: Twice a week    Attends Religious Services: 1 to 4 times per year    Active Member of [employer] or Organizations: No    Attends Banker Meetings: Never    Marital Status: Married  Catering manager Violence: Not At Risk (08/24/2023)   Humiliation, Afraid, Rape, and Kick questionnaire    Fear of Current or Ex-Partner: No    Emotionally  Abused: No    Physically Abused: No    Sexually Abused: No    Family History:   Family History  Problem Relation Age of Onset   Depression Sister      ROS:  Please see the history of present illness.  All other ROS reviewed and negative.     Physical Exam/Data:   Vitals:   11/24/23 0800 11/24/23 0830 11/24/23 0915 11/24/23 1015  BP: 115/63 110/67 (!) 108/55 115/61  Pulse: 66 66 64 66  Resp: 18 18 17 20   Temp:      TempSrc:      SpO2: 96% 100%  96% 96%  Weight:      Height:        Intake/Output Summary (Last 24 hours) at 11/24/2023 1041 Last data filed at 11/24/2023 0804 Gross per 24 hour  Intake --  Output 375 ml  Net -375 ml      11/23/2023    8:34 PM 08/26/2023    4:50 AM 08/25/2023    2:55 AM  Last 3 Weights  Weight (lbs) 161 lb 161 lb 14.4 oz 160 lb 7.9 oz  Weight (kg) 73.029 kg 73.437 kg 72.8 kg     Body mass index is 26.79 kg/m.  General:  Well nourished, well developed, in no acute distress HEENT: normal Neck: mild JVD Vascular: No carotid bruits; Distal pulses 2+ bilaterally Cardiac:  IRRR, 2/6 murmur Lungs:  very diminished  Abd: soft, nontender, no hepatomegaly  Ext: no edema Musculoskeletal:  No deformities, BUE and BLE strength normal and equal Skin: warm and dry  Neuro:  CNs 2-12 intact, no focal abnormalities noted Psych:  Normal affect   EKG:  The EKG was personally reviewed and demonstrates: Atrial flutter heart rate 67.  Right bundle branch block. Telemetry:  Telemetry was personally reviewed and demonstrates: Atrial flutter with variable conduction conduction  Relevant CV Studies: Echocardiogram 08/25/2023 1. Left ventricular ejection fraction, by estimation, is 55 to 60%. The  left ventricle has normal function. The left ventricle has no regional  wall motion abnormalities. There is mild concentric left ventricular  hypertrophy. Left ventricular diastolic  function could not be evaluated.   2. Right ventricular systolic function is  mildly reduced. The right  ventricular size is normal.   3. The mitral valve is normal in structure. Trivial mitral valve  regurgitation. No evidence of mitral stenosis.   4. The aortic valve is tricuspid. There is mild calcification of the  aortic valve. Aortic valve regurgitation is not visualized. Aortic valve  sclerosis/calcification is present, without any evidence of aortic  stenosis.   5. The inferior vena cava is normal in size with greater than 50%  respiratory variability, suggesting right atrial pressure of 3 mmHg.   Left heart catheterization 09/16/2022 Prox RCA lesion is 75% stenosed.   Mid RCA lesion is 80% stenosed.   Mid LAD lesion is 60% stenosed.   Mid LM to Dist LM lesion is 85% stenosed.   Ramus lesion is 85% stenosed.   RPAV-1 lesion is 75% stenosed.   Ost Cx lesion is 90% stenosed.   Dist LM to Ost LAD lesion is 85% stenosed.   RPDA lesion is 95% stenosed.   RPAV-2 lesion is 70% stenosed.   1st Mrg lesion is 85% stenosed.   Origin to Prox Graft lesion is 100% stenosed.   Severe multivessel native CAD with 85% distal left main stenosis ostial LAD ramus intermediate and left circumflex stenoses.   The LAD has a percent proximal stenosis prior to the LIMA takeoff and there is competitive filling via the LIMA graft with the LAD injection.   This intermediate stenosis with ostial stenosis but with good competitive filling via the patent graft.   Left circumflex vessel with a 5% ostial stenosis.  The first marginal vessel is small caliber and has diffuse 8085% stenosis in the midsegment.   RCA is a dominant vessel that has diffuse 75% mid stenosis 80 to 70% mid distal stenosis, 95% ostial PDA stenosis with 70% mid continuation branch stenosis.  Competitive filling is seen in the posterolateral vessel.  No competitive filling to the  PDA.   Patent vein graft supplying the ramus intermediate vessel.   Old occluded vein graft which had supplied the OM1 vessel.    Patent vein graft which supplies the PLA vessel.  There is 40% stenosis in the distal third portion of the graft.  This graft was sequential graft which also had supplied the PDA vessel which is not visualized at this segment is occluded.     Patent subclavian artery stent arising from the aorta.   Patent LIMA graft supplying the mid LAD.   RECOMMENDATION: Resume the patient's prior Plavix/Eliquis with history of multi- vessel CAD, subclavian stent, and PAF. Medical therapy for CAD.  Rest of lipid-lowering therapy with target LDL less than 70.  Laboratory Data:  High Sensitivity Troponin:   Recent Labs  Lab 11/23/23 2109  TROPONINIHS 26*     Chemistry Recent Labs  Lab 11/23/23 2036  NA 140  K 3.3*  CL 98  CO2 32  GLUCOSE 110*  BUN 13  CREATININE 1.07  CALCIUM 8.3*  GFRNONAA >60  ANIONGAP 10    No results for input(s): "PROT", "ALBUMIN", "AST", "ALT", "ALKPHOS", "BILITOT" in the last 168 hours. Lipids No results for input(s): "CHOL", "TRIG", "HDL", "LABVLDL", "LDLCALC", "CHOLHDL" in the last 168 hours.  Hematology Recent Labs  Lab 11/23/23 2036  WBC 11.0*  RBC 5.08  HGB 15.3  HCT 46.4  MCV 91.3  MCH 30.1  MCHC 33.0  RDW 14.9  PLT 220   Thyroid No results for input(s): "TSH", "FREET4" in the last 168 hours.  BNP Recent Labs  Lab 11/24/23 0035  BNP 42.4    DDimer No results for input(s): "DDIMER" in the last 168 hours.   Radiology/Studies:  CT Angio Chest PE W and/or Wo Contrast Result Date: 11/24/2023 CLINICAL DATA:  Pulmonary embolism suspected, high probability. Sinus tachycardia EXAM: CT ANGIOGRAPHY CHEST WITH CONTRAST TECHNIQUE: Multidetector CT imaging of the chest was performed using the standard protocol during bolus administration of intravenous contrast. Multiplanar CT image reconstructions and MIPs were obtained to evaluate the vascular anatomy. RADIATION DOSE REDUCTION: This exam was performed according to the departmental dose-optimization  program which includes automated exposure control, adjustment of the mA and/or kV according to patient size and/or use of iterative reconstruction technique. CONTRAST:  75mL OMNIPAQUE IOHEXOL 350 MG/ML SOLN COMPARISON:  01/03/2022 FINDINGS: Cardiovascular: Satisfactory opacification of the pulmonary arteries to the segmental level. No evidence of pulmonary embolism. Normal heart size. No pericardial effusion. Extensive atheromatous calcification of the aorta and coronaries. Prior CABG and left subclavian stenting, the systemic arterial tree is not opacified on this arterial study. Mediastinum/Nodes: Negative for mass or adenopathy. Lungs/Pleura: The central airways are clear. There is no edema, consolidation, effusion, or pneumothorax. Centrilobular emphysema. A right lower lobe nodule on 7:182 is unchanged at 5 mm average diameter. Wispy density in the right upper lobe is stable from 2023 and likely scarring. Generalized airway thickening. Upper Abdomen: No acute finding Musculoskeletal: No acute or aggressive finding. Review of the MIP images confirms the above findings. IMPRESSION: 1. Negative for pulmonary embolism or other acute finding. 2. Atherosclerosis and emphysema. Electronically Signed   By: Tiburcio Pea M.D.   On: 11/24/2023 07:00   DG Chest Port 1 View Result Date: 11/23/2023 CLINICAL DATA:  Atrial fibrillation EXAM: PORTABLE CHEST 1 VIEW COMPARISON:  Chest x-ray 08/24/2023 FINDINGS: The heart size and mediastinal contours are within normal limits. Both lungs are clear. Sternotomy wires are again noted as well as surgical clips in the  mediastinum and vascular stent. No acute osseous abnormality. There is a healed right seventh rib fracture. IMPRESSION: No active disease. Electronically Signed   By: Darliss Cheney M.D.   On: 11/23/2023 22:53     Assessment and Plan:   Persistent atrial flutter Admitted again for symptomatic atrial flutter, appears to have wide range of variable heart rates  at times in the 140s and complaints of shortness of breath/palpitations/fatigue when tachy.  Heart rates controlled and fixed right now in the 60s.  He is comfortable now with no complaints Likely just need to plan for DCCV once his INR is therapeutic Coumadin per pharmacy.  Not able to afford Eliquis.  But may need to consider other alternatives if he remains persistently subtherapeutic. Continue diltiazem 240 mg History of OSA, does not tolerate CPAP.  May need to consider outpatient ENT referral to consider inspire or something else. Normal TSH from last admission in November May eventually need to consider ablation.  Amiodarone not great with his underlying COPD.  Chronic HFpEF Overall looks euvolemic.  Mild JVD but has not had any of his diuretics today. Continue with Lasix 40 mg, spironolactone 12.5 mg  CAD status post CABG Last heart catheterization in December 2023 with overall stable disease. Patent SVG to PLA, patent SVG to ramus, patent LIMA to LAD and occluded SVG to OM 1 No chest pain.  Continue medical management. Continue with Plavix, atorvastatin 80 mg  COPD Suspect this is likely severe, followed by pulmonology.  Likely a large contributor to his shortness of breath.  Has at least 2 hospitalizations for acute respiratory failure requiring mechanical ventilation.       Risk Assessment/Risk Scores:    CHA2DS2-VASc Score = 6   This indicates a 9.7% annual risk of stroke. The patient's score is based upon: CHF History: 1 HTN History: 1 Diabetes History: 0 Stroke History: 2 Vascular Disease History: 1 Age Score: 1 Gender Score: 0     For questions or updates, please contact [facility] Please consult [email or web address] for contact info under    Signed, Abagail Kitchens, PA-C  11/24/2023 10:41 AM

## 2023-11-24 NOTE — Progress Notes (Signed)
 PHARMACY - ANTICOAGULATION CONSULT NOTE  Pharmacy Consult for warfarin Indication: atrial fibrillation  Allergies  Allergen Reactions   Hibiclens [Chlorhexidine Gluconate] Other (See Comments)    Unknown reaction    Patient Measurements: Height: 5\' 5"  (165.1 cm) Weight: 73 kg (161 lb) IBW/kg (Calculated) : 61.5  Vital Signs: Temp: 98.3 F (36.8 C) (02/25 0536) Temp Source: Oral (02/25 0536) BP: 108/55 (02/25 0915) Pulse Rate: 64 (02/25 0915)  Labs: Recent Labs    11/23/23 2036 11/23/23 2109  HGB 15.3  --   HCT 46.4  --   PLT 220  --   LABPROT 18.9*  --   INR 1.6*  --   CREATININE 1.07  --   TROPONINIHS  --  26*    Estimated Creatinine Clearance: 54.3 mL/min (by C-G formula based on SCr of 1.07 mg/dL).   Medical History: 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, SVG-OM1, SVG-PDA, SVG-PL   Hyperlipemia    Hypertension    Leucocytosis    Liver dysfunction    Long term current use of clopidogrel    NSTEMI (non-ST elevated myocardial infarction) (HCC)    approx 2016   Peripheral artery disease (HCC)    legs   Tobacco abuse    Wears dentures    full upper and lower    Medications:  Awaiting electronic med rec  Assessment: 73 y.o. M presents with afib w/ RVR. Pt on warfarin PTA for afib. Admission INR 1.6. CBC ok on admission. Home dose: 11.5 mg weekly (2mg  Sat and Sun and 1.5mg  all other days)  Goal of Therapy:  INR 2-3 Monitor platelets by anticoagulation protocol: Yes   Plan:  Daily INR Warfarin 3mg  today  Christoper Fabian, PharmD, BCPS Please see amion for complete clinical pharmacist phone list 11/24/2023,9:57 AM

## 2023-11-24 NOTE — Assessment & Plan Note (Signed)
-  Blood pressure currently stable and demonstrating good control -Heart healthy/low-sodium diet discussed with patient -Continue treatment with Cardizem, Aldactone and Lasix. -Follow vital signs.

## 2023-11-24 NOTE — Assessment & Plan Note (Signed)
-  Patient reports using 2 L nasal cannula supplementation at bedtime on daily basis; This will be continue while inpatient.

## 2023-11-24 NOTE — Assessment & Plan Note (Signed)
-  Will replete electrolytes and follow trend -Goal is for potassium above 4 -Checking magnesium level.

## 2023-11-24 NOTE — Consult Note (Signed)
 ELECTROPHYSIOLOGY CONSULT NOTE    Patient ID: Dennis Preston MRN: [id number], DOB/AGE: [birth year] 73 y.o.  Admit date: 11/23/2023 Date of Consult: 11/24/2023  Primary Physician: Barbette Reichmann, MD Primary Cardiologist: None  Electrophysiologist: Dr. Nelly Laurence   Referring Provider: Dr. Mayford Knife  Patient Profile: Dennis Preston is a 73 y.o. male with a history of CAD s/p CABG, persistent AFL/AF, Wide-complex tachycardia/SVT, Chronic HFpEF, COPD, Chronic O2, HTN, PAD, and carotid disease who is being seen today for the evaluation of AFL at the request of Dr. Mayford Knife.  HPI:  Dennis Preston is a 73 y.o. male with complicated history as above.   Previously seen by EP in hospital 08/2022 with [facility] felt to be abberant SVT. Was on amiodarone for some time but ultimately stopped for fear of worsening COPD, as well as AFL despite.   EP asked to see for recommendations regarding anticoagulation and arrhyhtmia management.   Presented to ED 2/24 with tachy-palpitations. Felt poorly throughout the day, mildly hypoxic with ambulation. Primarily in the ED he has had controlled rates.   PTA he was USOH, able to do ADLs including groceries without difficulty. He denies any chest pain, lightheadedness, or syncope. INR has been persistent sub-therapeutic, though did have high result 10/21/2023 Gavin Potters).    He is currently asymptomatic at rest.   Labs Potassium3.3* (02/24 2036) Magnesium  2.0 (02/25 1147) Creatinine, ser  1.07 (02/24 2036) PLT  220 (02/24 2036) HGB  15.3 (02/24 2036) WBC 11.0* (02/24 2036) Troponin I (High Sensitivity)18* (02/25 1147).    CTA negative for PE, emphysema noted.  Allergies, Medical, Surgical, Social, and Family Histories have been reviewed and are referenced here-in when relevant for medical decision making.   Physical Exam: Vitals:   11/24/23 1100 11/24/23 1115 11/24/23 1130 11/24/23 1206  BP: (!) 126/47 108/60 (!) 106/57   Pulse: 72 65 67   Resp: (!) 21 18 17     Temp:    98.1 F (36.7 C)  TempSrc:    Oral  SpO2: 100% 100% 100%   Weight:      Height:        GEN- NAD, A&O x 3, normal affect HEENT: Normocephalic, atraumatic Lungs- Diminished throughout.  Heart- Regular rate and rhythm, No M/G/R.  GI- Soft, NT, ND.  Extremities- No clubbing, cyanosis, or edema    EKG: on arrival showed  (personally reviewed)  TELEMETRY: Rate controlled AFL in 60-70s, HRs elevated at times with rates as high as 140 briefly (personally reviewed)  Assessment/Plan:  Persistent atrial flutter H/o PAF Last sinus EKG 04/2023.  Previously started on amiodarone for WCT felt to be SVT with no other AAD options, stopped for concern of his COPD (also had AFL despite).  HRs controlled on my exam in 60-70s Increase diltiazem to 360 mg daily Can add BB if needed (consider bisoprolol as most cardioselective given COPD) Not currently [facility] or Ablation candidate with multiple sub-therapeutic INRs Can't afford Eliquis.  Not tikosyn candidate both with borderline QT. No 1c agents with CAD.    Chronic HFpEF Volume status may be slightly up.  Diuresis per gen cards.  Continue Lasix 40 mg, spironolactone 12.5 mg   CAD status post CABG Denies s/s ischemia Stable overall disease cath 08/2022 Continue plavix and statin.    COPD Mod- Severe given chronic O2 requirement and h/o hospitalizations.  Poor ablation candidate.   Maximize rate control. MD to see. Not currently candidate for EP procedures.   For questions or updates, please contact  [facility] Please consult [email or web address] for contact info under Cardiology/STEMI.  Dustin Flock, PA-C  11/24/2023 1:15 PM

## 2023-11-24 NOTE — Assessment & Plan Note (Signed)
-  Patient with history of paroxysmal atrial fibrillation/atrial flutter; presenting with palpitations and associated shortness of breath/hypoxia. -No chest pain; positive dyspnea on exertion. -Troponin and BNP stable -Patient's Coumadin level subtherapeutic; pharmacy service consulted to assist with management of Coumadin dosage. -Cardiology service has been consulted and 2D echo has been ordered -At rest patient's back in sinus rhythm with controlled Heart Rate; but with exertion experiencing increased in his HR to 140-150's range and associated SOB. -daily cardizem 240mg  ER has been continued. -will follow cardiology service rec's.

## 2023-11-24 NOTE — Assessment & Plan Note (Signed)
-  Continue as needed Valium.

## 2023-11-24 NOTE — Assessment & Plan Note (Signed)
-  Heart healthy/low-fat diet discussed with patient -Continue Lipitor.

## 2023-11-24 NOTE — ED Notes (Signed)
 Pt ambulated in hall oxygen dropped to 80%, hr to 156. MD Horton notified

## 2023-11-25 DIAGNOSIS — J441 Chronic obstructive pulmonary disease with (acute) exacerbation: Secondary | ICD-10-CM | POA: Diagnosis not present

## 2023-11-25 DIAGNOSIS — I2510 Atherosclerotic heart disease of native coronary artery without angina pectoris: Secondary | ICD-10-CM | POA: Diagnosis not present

## 2023-11-25 DIAGNOSIS — G4736 Sleep related hypoventilation in conditions classified elsewhere: Secondary | ICD-10-CM | POA: Diagnosis not present

## 2023-11-25 DIAGNOSIS — R0902 Hypoxemia: Secondary | ICD-10-CM

## 2023-11-25 DIAGNOSIS — I483 Typical atrial flutter: Secondary | ICD-10-CM | POA: Diagnosis not present

## 2023-11-25 DIAGNOSIS — F411 Generalized anxiety disorder: Secondary | ICD-10-CM | POA: Diagnosis not present

## 2023-11-25 DIAGNOSIS — E785 Hyperlipidemia, unspecified: Secondary | ICD-10-CM | POA: Diagnosis not present

## 2023-11-25 DIAGNOSIS — I5032 Chronic diastolic (congestive) heart failure: Secondary | ICD-10-CM | POA: Diagnosis not present

## 2023-11-25 DIAGNOSIS — I484 Atypical atrial flutter: Secondary | ICD-10-CM | POA: Diagnosis not present

## 2023-11-25 DIAGNOSIS — I110 Hypertensive heart disease with heart failure: Secondary | ICD-10-CM | POA: Diagnosis not present

## 2023-11-25 DIAGNOSIS — E7800 Pure hypercholesterolemia, unspecified: Secondary | ICD-10-CM | POA: Diagnosis not present

## 2023-11-25 DIAGNOSIS — J449 Chronic obstructive pulmonary disease, unspecified: Secondary | ICD-10-CM | POA: Diagnosis not present

## 2023-11-25 DIAGNOSIS — E876 Hypokalemia: Secondary | ICD-10-CM | POA: Diagnosis not present

## 2023-11-25 DIAGNOSIS — I2583 Coronary atherosclerosis due to lipid rich plaque: Secondary | ICD-10-CM | POA: Diagnosis not present

## 2023-11-25 DIAGNOSIS — I4892 Unspecified atrial flutter: Secondary | ICD-10-CM | POA: Diagnosis not present

## 2023-11-25 LAB — BASIC METABOLIC PANEL
Anion gap: 8 (ref 5–15)
BUN: 14 mg/dL (ref 8–23)
CO2: 32 mmol/L (ref 22–32)
Calcium: 8.7 mg/dL — ABNORMAL LOW (ref 8.9–10.3)
Chloride: 100 mmol/L (ref 98–111)
Creatinine, Ser: 1.09 mg/dL (ref 0.61–1.24)
GFR, Estimated: >60 mL/min (ref >60)
Glucose, Bld: 129 mg/dL — ABNORMAL HIGH (ref 70–99)
Potassium: 3.8 mmol/L (ref 3.5–5.1)
Sodium: 140 mmol/L (ref 135–145)

## 2023-11-25 LAB — CBC
HCT: 41 % (ref 39.0–52.0)
Hemoglobin: 13.3 g/dL (ref 13.0–17.0)
MCH: 30.2 pg (ref 26.0–34.0)
MCHC: 32.4 g/dL (ref 30.0–36.0)
MCV: 93 fL (ref 80.0–100.0)
Platelets: 154 10*3/uL (ref 150–400)
RBC: 4.41 MIL/uL (ref 4.22–5.81)
RDW: 15.2 % (ref 11.5–15.5)
WBC: 7.1 10*3/uL (ref 4.0–10.5)
nRBC: 0 % (ref 0.0–0.2)

## 2023-11-25 LAB — MAGNESIUM: Magnesium: 2 mg/dL (ref 1.7–2.4)

## 2023-11-25 LAB — PROTIME-INR
INR: 1.9 — ABNORMAL HIGH (ref 0.8–1.2)
Prothrombin Time: 21.6 s — ABNORMAL HIGH (ref 11.4–15.2)

## 2023-11-25 MED ORDER — WARFARIN SODIUM 2 MG PO TABS
2.0000 mg | ORAL_TABLET | Freq: Once | ORAL | Status: AC
  Administered 2023-11-25: 2 mg via ORAL
  Filled 2023-11-25: qty 1

## 2023-11-25 NOTE — Progress Notes (Signed)
 Nurse had paged me earlier today reporting wide-complex tachycardia.  Patient was stable vital signs and asymptomatic.  Review of telemetry wide-complex tachycardia likely SVT with aberrancy.  He has known history of this but unfortunately rate control is his only strategy given he cannot be reliably anticoagulated and other issues addressed in previous consult note by EP.  We just increased his diltiazem to 360 mg and he had not been on this very long before going into arrhythmia.  Would see how this suppresses his arrhythmia and if needed can also add bisoprolol.

## 2023-11-25 NOTE — Progress Notes (Signed)
 Patient ambulated 250 feet in hallway. Patient had no complaints throughout walk. Heart rate 138 while ambulating, then returned to 104 at rest.

## 2023-11-25 NOTE — Progress Notes (Signed)
 Patient began sustaining in wide complex tachycardia, rate 135. Patient feels slightly short of breath, no other symptoms. BP 128/73. EKG was obtained. RN paged Yvonna Alanis PA with cardiology who came to bedside to assess the patient.

## 2023-11-25 NOTE — Progress Notes (Signed)
 PHARMACY - ANTICOAGULATION CONSULT NOTE  Pharmacy Consult for warfarin Indication: atrial fibrillation  No Known Allergies   Patient Measurements: Height: 5\' 5"  (165.1 cm) Weight: 71.4 kg (157 lb 6.4 oz) IBW/kg (Calculated) : 61.5  Vital Signs: Temp: 97.7 F (36.5 C) (02/26 0423) Temp Source: Oral (02/26 0423) BP: 134/69 (02/26 0423) Pulse Rate: 65 (02/26 0423)  Labs: Recent Labs    11/23/23 2036 11/23/23 2109 11/24/23 1147 11/25/23 0534  HGB 15.3  --   --  13.3  HCT 46.4  --   --  41.0  PLT 220  --   --  154  LABPROT 18.9*  --   --  21.6*  INR 1.6*  --   --  1.9*  CREATININE 1.07  --   --  1.09  TROPONINIHS  --  26* 18*  --     Estimated Creatinine Clearance: 53.3 mL/min (by C-G formula based on SCr of 1.09 mg/dL).   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 M presents with afib w/ RVR. Pt on warfarin PTA for afib. Admission INR 1.6. CBC ok on admission. Home dose: 11.5 mg weekly (2mg  Sat and Sun and 1.5mg  all other days)  INR today is 1.9, trending up.  Goal of Therapy:  INR 2-3 Monitor platelets by anticoagulation protocol: Yes   Plan:  Warfarin 2mg  x1 tonight Daily INR  Fredonia Highland, PharmD, BCPS, [facility] Clinical Pharmacist [phone] Please check AMION for all [facility]  numbers 11/25/2023

## 2023-11-25 NOTE — Progress Notes (Addendum)
 Progress Note  Patient Name: Dennis Preston Date of Encounter: 11/25/2023  [facility] Cardiologist: None   Patient Profile     Subjective   Denies any CP or SOB  Inpatient Medications    Scheduled Meds:  atorvastatin  80 mg Oral q1800   clopidogrel  75 mg Oral QHS   diltiazem  360 mg Oral Daily   diltiazem  10 mg Intravenous Once   fluticasone furoate-vilanterol  1 puff Inhalation Daily   furosemide  40 mg Oral Daily   sodium chloride flush  3 mL Intravenous Q12H   umeclidinium bromide  1 puff Inhalation Daily   Warfarin - Pharmacist Dosing Inpatient   Does not apply q1600   Continuous Infusions:  sodium chloride     PRN Meds: sodium chloride, acetaminophen **OR** acetaminophen, diazepam, levalbuterol, ondansetron **OR** ondansetron (ZOFRAN) IV, sodium chloride flush   Vital Signs    Vitals:   11/24/23 1949 11/24/23 2316 11/25/23 0423 11/25/23 0510  BP: 130/62 108/69 134/69   Pulse: 66 (!) 43 65   Resp: 18 18 18    Temp: 97.7 F (36.5 C) (!) 97.5 F (36.4 C) 97.7 F (36.5 C)   TempSrc: Oral Oral Oral   SpO2: 97% 91% 96%   Weight: 73 kg   71.4 kg  Height: 5\' 5"  (1.651 m)       Intake/Output Summary (Last 24 hours) at 11/25/2023 0738 Last data filed at 11/25/2023 0511 Gross per 24 hour  Intake 236 ml  Output 775 ml  Net -539 ml      11/25/2023    5:10 AM 11/24/2023    7:49 PM 11/23/2023    8:34 PM  Last 3 Weights  Weight (lbs) 157 lb 6.4 oz 161 lb 161 lb  Weight (kg) 71.396 kg 73.03 kg 73.029 kg      Telemetry    Atrial flutter with CVR - Personally Reviewed  ECG    No new EKG to review- Personally Reviewed  Physical Exam   GEN: No acute distress.   Neck: No JVD Cardiac: Irregularly irregular, no murmurs, rubs, or gallops.  Respiratory: scattered wheezes GI: Soft, nontender, non-distended  MS: No edema; No deformity. Neuro:  Nonfocal  Psych: Normal affect   Labs    High Sensitivity Troponin:   Recent Labs  Lab 11/23/23 2109  11/24/23 1147  TROPONINIHS 26* 18*      Chemistry Recent Labs  Lab 11/23/23 2036 11/25/23 0534  NA 140 140  K 3.3* 3.8  CL 98 100  CO2 32 32  GLUCOSE 110* 129*  BUN 13 14  CREATININE 1.07 1.09  CALCIUM 8.3* 8.7*  GFRNONAA >60 >60  ANIONGAP 10 8     Hematology Recent Labs  Lab 11/23/23 2036 11/25/23 0534  WBC 11.0* 7.1  RBC 5.08 4.41  HGB 15.3 13.3  HCT 46.4 41.0  MCV 91.3 93.0  MCH 30.1 30.2  MCHC 33.0 32.4  RDW 14.9 15.2  PLT 220 154    BNP Recent Labs  Lab 11/24/23 0035  BNP 42.4     DDimer No results for input(s): "DDIMER" in the last 168 hours.   CHA2DS2-VASc Score = 6   This indicates a 9.7% annual risk of stroke. The patient's score is based upon: CHF History: 1 HTN History: 1 Diabetes History: 0 Stroke History: 2 Vascular Disease History: 1 Age Score: 1 Gender Score: 0      Radiology    CT Angio Chest PE W and/or Wo Contrast  Result Date: 11/24/2023 CLINICAL DATA:  Pulmonary embolism suspected, high probability. Sinus tachycardia EXAM: CT ANGIOGRAPHY CHEST WITH CONTRAST TECHNIQUE: Multidetector CT imaging of the chest was performed using the standard protocol during bolus administration of intravenous contrast. Multiplanar CT image reconstructions and MIPs were obtained to evaluate the vascular anatomy. RADIATION DOSE REDUCTION: This exam was performed according to the departmental dose-optimization program which includes automated exposure control, adjustment of the mA and/or kV according to patient size and/or use of iterative reconstruction technique. CONTRAST:  75mL OMNIPAQUE IOHEXOL 350 MG/ML SOLN COMPARISON:  01/03/2022 FINDINGS: Cardiovascular: Satisfactory opacification of the pulmonary arteries to the segmental level. No evidence of pulmonary embolism. Normal heart size. No pericardial effusion. Extensive atheromatous calcification of the aorta and coronaries. Prior CABG and left subclavian stenting, the systemic arterial tree is not  opacified on this arterial study. Mediastinum/Nodes: Negative for mass or adenopathy. Lungs/Pleura: The central airways are clear. There is no edema, consolidation, effusion, or pneumothorax. Centrilobular emphysema. A right lower lobe nodule on 7:182 is unchanged at 5 mm average diameter. Wispy density in the right upper lobe is stable from 2023 and likely scarring. Generalized airway thickening. Upper Abdomen: No acute finding Musculoskeletal: No acute or aggressive finding. Review of the MIP images confirms the above findings. IMPRESSION: 1. Negative for pulmonary embolism or other acute finding. 2. Atherosclerosis and emphysema. Electronically Signed   By: Tiburcio Pea M.D.   On: 11/24/2023 07:00   DG Chest Port 1 View Result Date: 11/23/2023 CLINICAL DATA:  Atrial fibrillation EXAM: PORTABLE CHEST 1 VIEW COMPARISON:  Chest x-ray 08/24/2023 FINDINGS: The heart size and mediastinal contours are within normal limits. Both lungs are clear. Sternotomy wires are again noted as well as surgical clips in the mediastinum and vascular stent. No acute osseous abnormality. There is a healed right seventh rib fracture. IMPRESSION: No active disease. Electronically Signed   By: Darliss Cheney M.D.   On: 11/23/2023 22:53    Patient Profile     73 y.o. male  with a hx of CAD status post CABG, persistent atrial flutter/fib, wide-complex tachycardia/SVT, chronic HFpEF, COPD chronically on supplemental oxygen (follows with pulmonology), hypertension, PAD status post left subclavian stent 2023, carotid artery stenosis status post CEA, hyperlipidemia, alcohol/tobacco abuse, OSA not on CPAP  who is being seen 11/24/2023 for the evaluation of atrial flutter at the request of Dr. Gwenlyn Perking.   Assessment & Plan    Persistent atrial flutter -Patient has a history of paroxysmal atrial flutter in the past but appears to have been in persistent atrial flutter since November primarily due to trying to get him adequately  anticoagulated to be cardioverted -Eliquis was cost prohibitive and he was started on Coumadin but has not been able to get a therapeutic INR -Primarily in atrial flutter with variable block and controlled ventricular response but at times will have episodes of a flutter with RVR.  Unclear what drives these tachycardic episodes -He is not hypoxemic and chest x-ray is clear.   -TSH normal -He has not been on antiarrhythmic drug therapy but last left atrial size was normal. -Cannot cardiovert at this time since he has not had a therapeutic INR on Coumadin and I am concerned about him being able to maintain a therapeutic INR even if we get him to an INR >2 -He has obstructive sleep apnea but has been intolerant to CPAP and this may be driving some of his a flutter -Would benefit from repeat outpatient sleep study and if AHI >15 refer  for consideration of inspire device implant -Appreciate EP consult.  Since rate controlled most of the time they recommended increasing Cardizem CD 360 mg daily.  Amiodarone had been stopped in the past due to COPD.  Not a Tikosyn candidate due to borderline QT.  CAD precludes use of 1C agents. -Felt not to be a candidate for a flutter ablation due to inability to get INR therapeutic -Patient cannot afford Eliquis and is currently on Coumadin but with much difficulty getting to INR goal and keeping there -Heart rate controlled today -Continue Cardizem CD 360 mg daily and warfarin -Could consider addition of bisoprolol which is beta-1 selective if needed for rate control down the road   Chronic HFpEF -Euvolemic on exam today -Chest x-ray was clear and BNP was normal -Continue PTA diuretics including Lasix 40 mg every morning and 20 mg every afternoon, spironolactone 12.5 mg daily  ASCAD Elevated Troponin -Status post remote CABG -Cath 06/2022 with patent SVG to the RPDA/RPLA, patent SVG to ramus, chronically occluded SVG to left circumflex/OM, patent LIMA to the LAD,  95% ostial left subclavian stenosis with subsequent stenting. -He has not had any anginal symptoms -minimally elevated trop with flat trend not c/w ACS and likely related to demand ischemia in setting of tachy arrhythmias and underlying CAD>>no further ischemic workup indicated -Continue Plavix 75 mg daily and atorvastatin 80 mg daily -No aspirin due to warfarin use -Beta-blockers been held in the past due to COPD   COPD -Followed by pulmonary -Suspect this is also contributing to his atrial arrhythmias   [facility] will sign off.   Medication Recommendations: Plavix 75 mg daily, atorvastatin 80 mg daily, Cardizem CD 360 mg daily, Lasix 40 mg every morning and 20 mg every afternoon, spironolactone 12.5 mg daily and warfarin dosed per pharmacy Other recommendations (labs, testing, etc): None Follow up as an outpatient: Follow-up with [facility] in 1 to 2 weeks  I spent 35 minutes caring for this patient today face to face, ordering and reviewing labs, reviewing records from consult note from EP , seeing the patient, documenting in the record, and arranging for a Followup with Dr. Juliann Pares at [facility]   For questions or updates, please contact [facility] Please consult [email or web address] for contact info under        Signed, Armanda Magic, MD  11/25/2023, 7:38 AM

## 2023-11-25 NOTE — Progress Notes (Signed)
 [facility]                                                                              Dennis Preston, is a 73 y.o. male, DOB - [birth year], ZOX:[id number] Admit date - 11/23/2023    Outpatient Primary MD for the patient is Barbette Reichmann, MD  LOS - 0  days  Chief Complaint  Patient presents with   Shortness of Breath   Tachycardia       Brief summary   Patient is a 73 year old male with nocturnal hypoxia, HTN, hyperlipidemia, COPD, anxiety, paroxysmal A-fib/flutter presented to ED with shortness of breath and palpitations.  Patient reported symptoms present for 24 to 36 hours prior to admission.  He initially thought his symptoms were driven by underlying COPD and no relief by the use of inhalers and nebulizer.  In ED, heart rate in 140s range, SVT. INR subtherapeutic.  Patient received Cardizem, O2, cardiology consulted.   Assessment & Plan    Principal Problem:   Atrial flutter with rapid ventricular response (HCC), persistent -History of paroxysmal atrial flutter in the past, however heart rate now persistently elevated -Cardiology and EP cardiology consulted -Chest x-ray clear, normal TSH. -Will need outpatient sleep study -Per cardiology, cannot cardiovert at this time due to subtherapeutic INR -EP cardiology following, has been on amiodarone in the past, discontinued due to COPD, due to subtherapeutic INR, not a candidate for rhythm control intervention, unable to afford DOAC's.  On warfarin -Continue Coumadin per pharmacy -Heart rate still not well-controlled from 90s to 130s -Per cardiology, continue Cardizem CD3 60 mg daily, could consider addition of bisoprolol if needed for rate control    Active problems COPD (chronic obstructive pulmonary disease) with chronic bronchitis (HCC) -Currently stable, no wheezing, continue Xopenex, Incruse Ellipta  -Continue outpatient follow-up with pulmonology service. -Needs outpatient sleep study    Essential hypertension -BP stable -Continue Cardizem, Lasix   Hypokalemia -Potassium stable, bases needed   Hyperlipidemia -Continue Lipitor.   Chronic heart failure with preserved ejection fraction (HCC) Elevated troponin -Stable and compensated -Cardiology following, recommended continue PTA diuretics including Lasix 40 mg a.m., 20 mg every afternoon, Aldactone 12.5 mg daily -Elevated troponin likely due to demand ischemia in the setting of persistent atrial flutter -Continue Plavix, Lipitor.  No aspirin use due to warfarin.   Nocturnal hypoxia -Patient reports using 2 L nasal cannula supplementation at bedtime on daily basis; This will be continue while inpatient.    Anxiety state -Continue as needed Valium.   Estimated body mass index is 26.19 kg/m as calculated from the following:   Height as of this encounter: 5\' 5"  (1.651 m).   Weight as of this encounter: 71.4 kg.  Code Status: Full code DVT Prophylaxis:   warfarin (COUMADIN) tablet 2 mg   Level of Care: Level of care: Progressive Family Communication: Updated patient Disposition Plan:      Remains inpatient appropriate: Hopefully tomorrow if heart rate is controlled   Procedures:    Consultants:     Antimicrobials:   Anti-infectives (From admission, onward)    None          Medications  atorvastatin  80 mg Oral q1800   clopidogrel  75 mg Oral QHS   diltiazem  360 mg Oral Daily   diltiazem  10 mg Intravenous Once   fluticasone furoate-vilanterol  1 puff Inhalation Daily   furosemide  40 mg Oral Daily   sodium chloride flush  3 mL Intravenous Q12H   umeclidinium bromide  1 puff Inhalation Daily   warfarin  2 mg Oral ONCE-1600   Warfarin - Pharmacist Dosing Inpatient   Does not apply q1600      Subjective:   Dennis Preston was seen and examined today.  On evaluation, heart rate not well-controlled from 99- 130s.  No chest pain, acute shortness of breath, fever or chills, dizziness or  lightheadedness.  No acute events overnight.    Objective:   Vitals:   11/25/23 0423 11/25/23 0510 11/25/23 1018 11/25/23 1205  BP: 134/69  128/73 116/79  Pulse: 65   64  Resp: 18   18  Temp: 97.7 F (36.5 C)   (!) 97.3 F (36.3 C)  TempSrc: Oral   Oral  SpO2: 96%   94%  Weight:  71.4 kg    Height:        Intake/Output Summary (Last 24 hours) at 11/25/2023 1430 Last data filed at 11/25/2023 1354 Gross per 24 hour  Intake 472 ml  Output 1350 ml  Net -878 ml     Wt Readings from Last 3 Encounters:  11/25/23 71.4 kg  08/26/23 73.4 kg  07/14/23 71.7 kg     Exam General: Alert and oriented x 3, NAD Cardiovascular: Irregularly irregular, tachycardia Respiratory: Clear to auscultation bilaterally, no wheezing Gastrointestinal: Soft, nontender, nondistended, + bowel sounds Ext: no pedal edema bilaterally Neuro: Strength 5/5 upper and lower extremities bilaterally Psych: Normal affect     Data Reviewed:  I have personally reviewed following labs    CBC Lab Results  Component Value Date   WBC 7.1 11/25/2023   RBC 4.41 11/25/2023   HGB 13.3 11/25/2023   HCT 41.0 11/25/2023   MCV 93.0 11/25/2023   MCH 30.2 11/25/2023   PLT 154 11/25/2023   MCHC 32.4 11/25/2023   RDW 15.2 11/25/2023   LYMPHSABS 0.7 08/24/2023   MONOABS 1.0 08/24/2023   EOSABS 0.1 08/24/2023   BASOSABS 0.0 08/24/2023     Last metabolic panel Lab Results  Component Value Date   NA 140 11/25/2023   K 3.8 11/25/2023   CL 100 11/25/2023   CO2 32 11/25/2023   BUN 14 11/25/2023   CREATININE 1.09 11/25/2023   GLUCOSE 129 (H) 11/25/2023   GFRNONAA >60 11/25/2023   GFRAA >60 06/22/2020   CALCIUM 8.7 (L) 11/25/2023   PHOS 3.3 09/14/2022   PROT 7.4 12/27/2022   ALBUMIN 4.7 12/27/2022   BILITOT 0.7 12/27/2022   ALKPHOS 81 12/27/2022   AST 26 12/27/2022   ALT 27 12/27/2022   ANIONGAP 8 11/25/2023    CBG (last 3)  No results for input(s): "GLUCAP" in the last 72 hours.    Coagulation  Profile: Recent Labs  Lab 11/23/23 2036 11/25/23 0534  INR 1.6* 1.9*     Radiology Studies: I have personally reviewed the imaging studies  CT Angio Chest PE W and/or Wo Contrast Result Date: 11/24/2023 CLINICAL DATA:  Pulmonary embolism suspected, high probability. Sinus tachycardia EXAM: CT ANGIOGRAPHY CHEST WITH CONTRAST TECHNIQUE: Multidetector CT imaging of the chest was performed using the standard protocol during bolus administration of intravenous contrast. Multiplanar CT image reconstructions and MIPs  were obtained to evaluate the vascular anatomy. RADIATION DOSE REDUCTION: This exam was performed according to the departmental dose-optimization program which includes automated exposure control, adjustment of the mA and/or kV according to patient size and/or use of iterative reconstruction technique. CONTRAST:  75mL OMNIPAQUE IOHEXOL 350 MG/ML SOLN COMPARISON:  01/03/2022 FINDINGS: Cardiovascular: Satisfactory opacification of the pulmonary arteries to the segmental level. No evidence of pulmonary embolism. Normal heart size. No pericardial effusion. Extensive atheromatous calcification of the aorta and coronaries. Prior CABG and left subclavian stenting, the systemic arterial tree is not opacified on this arterial study. Mediastinum/Nodes: Negative for mass or adenopathy. Lungs/Pleura: The central airways are clear. There is no edema, consolidation, effusion, or pneumothorax. Centrilobular emphysema. A right lower lobe nodule on 7:182 is unchanged at 5 mm average diameter. Wispy density in the right upper lobe is stable from 2023 and likely scarring. Generalized airway thickening. Upper Abdomen: No acute finding Musculoskeletal: No acute or aggressive finding. Review of the MIP images confirms the above findings. IMPRESSION: 1. Negative for pulmonary embolism or other acute finding. 2. Atherosclerosis and emphysema. Electronically Signed   By: Tiburcio Pea M.D.   On: 11/24/2023 07:00   DG  Chest Port 1 View Result Date: 11/23/2023 CLINICAL DATA:  Atrial fibrillation EXAM: PORTABLE CHEST 1 VIEW COMPARISON:  Chest x-ray 08/24/2023 FINDINGS: The heart size and mediastinal contours are within normal limits. Both lungs are clear. Sternotomy wires are again noted as well as surgical clips in the mediastinum and vascular stent. No acute osseous abnormality. There is a healed right seventh rib fracture. IMPRESSION: No active disease. Electronically Signed   By: Darliss Cheney M.D.   On: 11/23/2023 22:53       Claudina Oliphant M.D. [facility] 11/25/2023, 2:30 PM  Available via Epic secure chat 7am-7pm After 7 pm, please refer to night coverage provider listed on amion.

## 2023-11-25 NOTE — TOC CM/SW Note (Signed)
 Transition of Care [facility]) - Inpatient Brief Assessment   Patient Details  Name: Dennis Preston MRN: [id number] Date of Birth: [birth year]  Transition of Care [facility]) CM/SW Contact:    Gala Lewandowsky, RN Phone Number: 11/25/2023, 2:42 PM   Clinical Narrative: Patient presented for shortness of breath-atrial fib. PTA patient was from home with family support. Case Manager will continue to follow for transition of care needs as the patient progresses.    Transition of Care Asessment: Insurance and Status: Insurance coverage has been reviewed Patient has primary care physician: Yes Home environment has been reviewed: reviewed Prior level of function:: independent Prior/Current Home Services: No current home services Social Drivers of Health Review: SDOH reviewed no interventions necessary Readmission risk has been reviewed: Yes Transition of care needs: no transition of care needs at this time

## 2023-11-25 NOTE — Progress Notes (Signed)
 Patient ambulated again in hallway, 250 ft. HR up to 120s and did not go higher than 128. Patient tolerated well.

## 2023-11-26 DIAGNOSIS — I483 Typical atrial flutter: Secondary | ICD-10-CM | POA: Diagnosis not present

## 2023-11-26 DIAGNOSIS — E876 Hypokalemia: Secondary | ICD-10-CM | POA: Diagnosis not present

## 2023-11-26 DIAGNOSIS — J441 Chronic obstructive pulmonary disease with (acute) exacerbation: Secondary | ICD-10-CM | POA: Diagnosis not present

## 2023-11-26 DIAGNOSIS — I4892 Unspecified atrial flutter: Secondary | ICD-10-CM | POA: Diagnosis not present

## 2023-11-26 DIAGNOSIS — I110 Hypertensive heart disease with heart failure: Secondary | ICD-10-CM | POA: Diagnosis not present

## 2023-11-26 DIAGNOSIS — J449 Chronic obstructive pulmonary disease, unspecified: Secondary | ICD-10-CM | POA: Diagnosis not present

## 2023-11-26 DIAGNOSIS — F411 Generalized anxiety disorder: Secondary | ICD-10-CM | POA: Diagnosis not present

## 2023-11-26 DIAGNOSIS — G4736 Sleep related hypoventilation in conditions classified elsewhere: Secondary | ICD-10-CM | POA: Diagnosis not present

## 2023-11-26 DIAGNOSIS — I5032 Chronic diastolic (congestive) heart failure: Secondary | ICD-10-CM | POA: Diagnosis not present

## 2023-11-26 DIAGNOSIS — J4489 Other specified chronic obstructive pulmonary disease: Secondary | ICD-10-CM | POA: Diagnosis not present

## 2023-11-26 DIAGNOSIS — R0902 Hypoxemia: Secondary | ICD-10-CM | POA: Diagnosis not present

## 2023-11-26 DIAGNOSIS — E785 Hyperlipidemia, unspecified: Secondary | ICD-10-CM | POA: Diagnosis not present

## 2023-11-26 LAB — PROTIME-INR
INR: 2 — ABNORMAL HIGH (ref 0.8–1.2)
Prothrombin Time: 22.5 s — ABNORMAL HIGH (ref 11.4–15.2)

## 2023-11-26 MED ORDER — DILTIAZEM HCL ER COATED BEADS 360 MG PO CP24: 360.0000 mg | ORAL_CAPSULE | Freq: Every day | ORAL | 3 refills | Status: DC

## 2023-11-26 MED ORDER — SPIRONOLACTONE 25 MG PO TABS: 12.5000 mg | ORAL_TABLET | Freq: Every day | ORAL | 3 refills | Status: AC

## 2023-11-26 MED ORDER — WARFARIN SODIUM 1 MG PO TABS
1.5000 mg | ORAL_TABLET | Freq: Once | ORAL | Status: DC
  Filled 2023-11-26: qty 1

## 2023-11-26 NOTE — Progress Notes (Signed)
 RN notified patient in sustained VT(at 1048)--on assessment patient was finishing up working with therapy, stated he was short of breath (o2 not turned on).  I placed O2 at 2L [place]. Upon comparison with yesterdays telemetry strip, patient in same rhythm of SVT with aberrancy.  EKG obtained showing aflutter with variable AV block.  Spoke to [employer], [place] he also reviewed strips, stating this is the same rhythm as yesterday, no new orders received.

## 2023-11-26 NOTE — Progress Notes (Signed)
 Mobility Specialist Progress Note:    11/26/23 1407  Mobility  Activity Ambulated with assistance in hallway  Level of Assistance Standby assist, set-up cues, supervision of patient - no hands on  Assistive Device Front wheel walker  Distance Ambulated (ft) 200 ft  Activity Response Tolerated well  Mobility Referral Yes  Mobility visit 1 Mobility  Mobility Specialist Start Time (ACUTE ONLY) 1145  Mobility Specialist Stop Time (ACUTE ONLY) 1200  Mobility Specialist Time Calculation (min) (ACUTE ONLY) 15 min   Pt received sitting EOB agreeable to mobility. No physical assistance needed. Pt ambulated on 2L/min SPO2 stayed between 92%-96%. Throughout ambulation pt HR stayed between 80-90, upon entering room PT HR went up to 130. While situating pt back in bed RN entered room and placed pt on 2L. Left w/ nursing staff in room. All needs met.   Thompson Grayer Mobility Specialist  Please contact vis Secure Chat or  Rehab Office [phone]

## 2023-11-26 NOTE — Progress Notes (Addendum)
 Ambulated prior to discharge per Dr. Isidoro Donning.  Patient displayed SVT with aberrancy, this was confirmed with Yvonna Alanis PA(cardiology). Patient denied chest discomfort, dizziness.  Dr Isidoro Donning updated, ok received to proceed with discharge.

## 2023-11-26 NOTE — Progress Notes (Signed)
 PHARMACY - ANTICOAGULATION CONSULT NOTE  Pharmacy Consult for warfarin Indication: atrial fibrillation  No Known Allergies   Patient Measurements: Height: 5\' 5"  (165.1 cm) Weight: 72 kg (158 lb 11.2 oz) IBW/kg (Calculated) : 61.5  Vital Signs: Temp: 97.6 F (36.4 C) (02/27 0434) Temp Source: Oral (02/27 0434) BP: 129/59 (02/27 0434) Pulse Rate: 66 (02/27 0639)  Labs: Recent Labs    11/23/23 2036 11/23/23 2109 11/24/23 1147 11/25/23 0534 11/26/23 0450  HGB 15.3  --   --  13.3  --   HCT 46.4  --   --  41.0  --   PLT 220  --   --  154  --   LABPROT 18.9*  --   --  21.6* 22.5*  INR 1.6*  --   --  1.9* 2.0*  CREATININE 1.07  --   --  1.09  --   TROPONINIHS  --  26* 18*  --   --     Estimated Creatinine Clearance: 53.3 mL/min (by C-G formula based on SCr of 1.09 mg/dL).   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 M presents with afib w/ RVR. Pt on warfarin PTA for afib. Admission INR 1.6. CBC ok on admission. Home warfarin dose: 11.5 mg weekly (2mg  Sat and Sun and 1.5mg  all other days)  INR today is up to 2.0.  Goal of Therapy:  INR 2-3 Monitor platelets by anticoagulation protocol: Yes   Plan:  Warfarin 1.5mg  x1 tonight Daily INR  Fredonia Highland, PharmD, BCPS, [facility] Clinical  Pharmacist [phone] Please check AMION for all [facility] numbers 11/26/2023

## 2023-11-26 NOTE — Discharge Summary (Signed)
 Physician Discharge Summary   Patient: Dennis Preston MRN: [id number] DOB: 05-08-1951  Admit date:     11/23/2023  Discharge date: 11/26/23  Discharge Physician: Thad Ranger, MD    PCP: Barbette Reichmann, MD   Recommendations at discharge:   Placed on Cardizem CD 360 mg daily Recommend outpatient sleep study  Discharge Diagnoses:    Atrial flutter with rapid ventricular response (HCC), persistent   Anxiety state   Nocturnal hypoxia   COPD (chronic obstructive pulmonary disease) with chronic bronchitis (HCC)   Hypokalemia   Essential hypertension   Hyperlipidemia   Chronic heart failure with preserved ejection fraction (HFpEF) [facility])    Hospital Course:  Patient is a 73 year old male with nocturnal hypoxia, HTN, hyperlipidemia, COPD, anxiety, paroxysmal A-fib/flutter presented to ED with shortness of breath and palpitations.  Patient reported symptoms present for 24 to 36 hours prior to admission.  He initially thought his symptoms were driven by underlying COPD and no relief by the use of inhalers and nebulizer.  In ED, heart rate in 140s range, SVT. INR subtherapeutic.  Patient received Cardizem, O2, cardiology consulted.   Assessment and Plan:   Atrial flutter with rapid ventricular response (HCC), persistent -History of paroxysmal atrial flutter in the past, however heart rate now persistently elevated -Cardiology and EP cardiology consulted -Chest x-ray clear, normal TSH. -Will need outpatient sleep study -Per cardiology, cannot cardiovert at this time due to subtherapeutic INR -EP cardiology following, difficult situation, has been on amiodarone in the past, discontinued due to COPD, due to subtherapeutic INR, not a candidate for rhythm control intervention, unable to afford DOAC's.   -Continue warfarin, INR 2.0, therapeutic at discharge -Per cardiology, continue Cardizem CD 360 mg daily, could consider addition of bisoprolol if needed for rate control Outpatient  follow-up with cardiology, Dr. Juliann Pares at [facility]      COPD (chronic obstructive pulmonary disease) with chronic bronchitis (HCC) -Currently stable, no wheezing, continue Xopenex, Incruse Ellipta  -Continue outpatient follow-up with pulmonology service, Dr Meredeth Ide at [facility]. -Needs outpatient sleep study   Essential hypertension -BP stable -Continue Cardizem, Lasix    Hypokalemia -Potassium stable, bases needed   Hyperlipidemia -Continue Lipitor.   Chronic heart failure with preserved ejection fraction (HCC) Elevated troponin -Stable and compensated -Cardiology following, recommended continue PTA diuretics including Lasix 40 mg a.m., 20 mg every afternoon, Aldactone 12.5 mg daily -Elevated troponin likely due to demand ischemia in the setting of persistent atrial flutter -Continue Plavix, Lipitor.  No aspirin use due to warfarin.   Nocturnal hypoxia -Patient reports using 2 L nasal cannula supplementation at bedtime on daily basis; This will be continue while inpatient.    Anxiety state -Continue Valium     Estimated body mass index is 26.19 kg/m as calculated from the following:   Height as of this encounter: 5\' 5"  (1.651 m).   Weight as of this encounter: 71.4 kg.      Pain control - [employer] Controlled Substance Reporting System database was reviewed. and patient was instructed, not to drive, operate heavy machinery, perform activities at heights, swimming or participation in water activities or provide baby-sitting services while on Pain, Sleep and Anxiety Medications; until their outpatient Physician has advised to do so again. Also recommended to not to take more than prescribed Pain, Sleep and Anxiety Medications.  Consultants: Cardiology Procedures performed: None Disposition: Home Diet recommendation:  Discharge Diet Orders (From admission, onward)     Start     Ordered   11/26/23  0000  Diet - low sodium heart healthy        11/26/23  1346            DISCHARGE MEDICATION: Allergies as of 11/26/2023   No Known Allergies      Medication List     TAKE these medications    atorvastatin 80 MG tablet Commonly known as: LIPITOR Take 1 tablet (80 mg total) by mouth daily at 6 PM.   clopidogrel 75 MG tablet Commonly known as: PLAVIX Take 75 mg by mouth at bedtime.   colchicine 0.6 MG tablet Take 0.6 mg by mouth daily as needed (gout flare).   diazepam 5 MG tablet Commonly known as: VALIUM Take 1 tablet (5 mg total) by mouth at bedtime.   diltiazem 360 MG 24 hr capsule Commonly known as: CARDIZEM CD Take 1 capsule (360 mg total) by mouth daily. Start taking on: November 27, 2023 What changed:  medication strength how much to take   furosemide 40 MG tablet Commonly known as: LASIX Take 40 mg in the morning and 20 mg (1/2 tablet) in the evening   levalbuterol 1.25 MG/3ML nebulizer solution Commonly known as: XOPENEX Take 1.25 mg by nebulization daily.   OXYGEN Inhale 2 L into the lungs at bedtime as needed (02 sats < 90).   spironolactone 25 MG tablet Commonly known as: ALDACTONE Take 0.5 tablets (12.5 mg total) by mouth daily. What changed: when to take this   Trelegy Ellipta 100-62.5-25 MCG/INH Aepb Generic drug: Fluticasone-Umeclidin-Vilant Inhale 1 puff into the lungs daily.   warfarin 1 MG tablet Commonly known as: COUMADIN Take 1.5 mg by mouth daily.        Follow-up Information     Barbette Reichmann, MD. Schedule an appointment as soon as possible for a visit in 2 week(s).   Specialty: Internal Medicine Why: for hospital follow-up Contact information: [address] [place] [address] [phone]         Alwyn Pea, MD. Schedule an appointment as soon as possible for a visit in 2 week(s).   Specialties: Cardiology, Internal Medicine Why: for hospital follow-up Contact information: [address] [place]  [address] [phone]         Mertie Moores, MD. Schedule an appointment as soon as possible for a visit in 2 week(s).   Specialty: Specialist Why: for hospital follow-up Contact information: [address] [place] [address] [phone]                Discharge Exam: Filed Weights   11/24/23 1949 11/25/23 0510 11/26/23 0434  Weight: 73 kg 71.4 kg 72 kg   S: No acute complaints, heart rate much better controlled in 60s at rest, was elevated on ambulation but asymptomatic, cleared by cardiology to discharge home.  Rhythm remains atrial flutter with variable AV block.  No acute chest pain or shortness of breath.   BP (!) 155/95   Pulse (!) 133   Temp 97.9 F (36.6 C) (Oral)   Resp 18   Ht 5\' 5"  (1.651 m)   Wt 72 kg   SpO2 94%   BMI 26.41 kg/m   Physical Exam General: Alert and oriented x 3, NAD Cardiovascular: Irregular Respiratory: CTAB, no wheezing Gastrointestinal: Soft, nontender, nondistended, NBS Ext: no pedal edema bilaterally Neuro: no new deficits Psych: Normal affect    Condition at discharge: fair  The results of significant diagnostics from this hospitalization (including imaging, microbiology, ancillary  and laboratory) are listed below for reference.   Imaging Studies: CT Angio Chest PE W and/or Wo Contrast Result Date: 11/24/2023 CLINICAL DATA:  Pulmonary embolism suspected, high probability. Sinus tachycardia EXAM: CT ANGIOGRAPHY CHEST WITH CONTRAST TECHNIQUE: Multidetector CT imaging of the chest was performed using the standard protocol during bolus administration of intravenous contrast. Multiplanar CT image reconstructions and MIPs were obtained to evaluate the vascular anatomy. RADIATION DOSE REDUCTION: This exam was performed according to the departmental dose-optimization program which includes automated exposure control, adjustment of the mA and/or kV according to patient size and/or use of iterative reconstruction technique.  CONTRAST:  75mL OMNIPAQUE IOHEXOL 350 MG/ML SOLN COMPARISON:  01/03/2022 FINDINGS: Cardiovascular: Satisfactory opacification of the pulmonary arteries to the segmental level. No evidence of pulmonary embolism. Normal heart size. No pericardial effusion. Extensive atheromatous calcification of the aorta and coronaries. Prior CABG and left subclavian stenting, the systemic arterial tree is not opacified on this arterial study. Mediastinum/Nodes: Negative for mass or adenopathy. Lungs/Pleura: The central airways are clear. There is no edema, consolidation, effusion, or pneumothorax. Centrilobular emphysema. A right lower lobe nodule on 7:182 is unchanged at 5 mm average diameter. Wispy density in the right upper lobe is stable from 2023 and likely scarring. Generalized airway thickening. Upper Abdomen: No acute finding Musculoskeletal: No acute or aggressive finding. Review of the MIP images confirms the above findings. IMPRESSION: 1. Negative for pulmonary embolism or other acute finding. 2. Atherosclerosis and emphysema. Electronically Signed   By: Tiburcio Pea M.D.   On: 11/24/2023 07:00   DG Chest Port 1 View Result Date: 11/23/2023 CLINICAL DATA:  Atrial fibrillation EXAM: PORTABLE CHEST 1 VIEW COMPARISON:  Chest x-ray 08/24/2023 FINDINGS: The heart size and mediastinal contours are within normal limits. Both lungs are clear. Sternotomy wires are again noted as well as surgical clips in the mediastinum and vascular stent. No acute osseous abnormality. There is a healed right seventh rib fracture. IMPRESSION: No active disease. Electronically Signed   By: Darliss Cheney M.D.   On: 11/23/2023 22:53    Microbiology: Results for orders placed or performed during the hospital encounter of 09/12/22  Resp panel by RT-PCR (RSV, Flu A&B, Covid) Anterior Nasal Swab     Status: None   Collection Time: 09/12/22  7:06 AM   Specimen: Anterior Nasal Swab  Result Value Ref Range Status   SARS Coronavirus 2 by RT  PCR NEGATIVE NEGATIVE Final    Comment: (NOTE) SARS-CoV-2 target nucleic acids are NOT DETECTED.  The SARS-CoV-2 RNA is generally detectable in upper respiratory specimens during the acute phase of infection. The lowest concentration of SARS-CoV-2 viral copies this assay can detect is 138 copies/mL. A negative result does not preclude SARS-Cov-2 infection and should not be used as the sole basis for treatment or other patient management decisions. A negative result may occur with  improper specimen collection/handling, submission of specimen other than nasopharyngeal swab, presence of viral mutation(s) within the areas targeted by this assay, and inadequate number of viral copies(<138 copies/mL). A negative result must be combined with clinical observations, patient history, and epidemiological information. The expected result is Negative.  Fact Sheet for Patients:  BloggerCourse.com  Fact Sheet for Healthcare Providers:  SeriousBroker.it  This test is no t yet approved or cleared by the [place] FDA and  has been authorized for detection and/or diagnosis of SARS-CoV-2 by FDA under an Emergency Use Authorization (EUA). This EUA will remain  in effect (meaning this test can be used)  for the duration of the COVID-19 declaration under Section 564(b)(1) of the Act, 21 U.S.C.section 360bbb-3(b)(1), unless the authorization is terminated  or revoked sooner.       Influenza A by PCR NEGATIVE NEGATIVE Final   Influenza B by PCR NEGATIVE NEGATIVE Final    Comment: (NOTE) The Xpert Xpress SARS-CoV-2/FLU/RSV plus assay is intended as an aid in the diagnosis of influenza from Nasopharyngeal swab specimens and should not be used as a sole basis for treatment. Nasal washings and aspirates are unacceptable for Xpert Xpress SARS-CoV-2/FLU/RSV testing.  Fact Sheet for Patients: BloggerCourse.com  Fact Sheet for  Healthcare Providers: SeriousBroker.it  This test is not yet approved or cleared by the [place] FDA and has been authorized for detection and/or diagnosis of SARS-CoV-2 by FDA under an Emergency Use Authorization (EUA). This EUA will remain in effect (meaning this test can be used) for the duration of the COVID-19 declaration under Section 564(b)(1) of the Act, 21 U.S.C. section 360bbb-3(b)(1), unless the authorization is terminated or revoked.     Resp Syncytial Virus by PCR NEGATIVE NEGATIVE Final    Comment: (NOTE) Fact Sheet for Patients: BloggerCourse.com  Fact Sheet for Healthcare Providers: SeriousBroker.it  This test is not yet approved or cleared by the [place] FDA and has been authorized for detection and/or diagnosis of SARS-CoV-2 by FDA under an Emergency Use Authorization (EUA). This EUA will remain in effect (meaning this test can be used) for the duration of the COVID-19 declaration under Section 564(b)(1) of the Act, 21 U.S.C. section 360bbb-3(b)(1), unless the authorization is terminated or revoked.  Performed at [facility], [address] [place], [place] [address]   Blood culture (routine x 2)     Status: None   Collection Time: 09/12/22 10:16 AM   Specimen: BLOOD LEFT HAND  Result Value Ref Range Status   Specimen Description BLOOD LEFT HAND  Final   Special Requests   Final    BOTTLES DRAWN AEROBIC AND ANAEROBIC Blood Culture results may not be optimal due to an inadequate volume of blood received in culture bottles   Culture   Final    NO GROWTH 5 DAYS Performed at [facility], [address], [place], [place] [address]    Report Status 09/17/2022 FINAL  Final  Blood culture (routine x 2)     Status: None   Collection Time: 09/12/22 10:19 AM   Specimen: BLOOD  Result Value Ref Range Status   Specimen Description BLOOD THUMB  Final   Special  Requests   Final    BOTTLES DRAWN AEROBIC ONLY Blood Culture results may not be optimal due to an inadequate volume of blood received in culture bottles   Culture   Final    NO GROWTH 5 DAYS Performed at [facility], [address], [place], [place] [address]    Report Status 09/17/2022 FINAL  Final  MRSA Next Gen by PCR, Nasal     Status: None   Collection Time: 09/12/22 10:29 AM   Specimen: Nasal Mucosa; Nasal Swab  Result Value Ref Range Status   MRSA by PCR Next Gen NOT DETECTED NOT DETECTED Final    Comment: (NOTE) The GeneXpert MRSA Assay (FDA approved for NASAL specimens only), is one component of a comprehensive MRSA colonization surveillance program. It is not intended to diagnose MRSA infection nor to guide or monitor treatment for MRSA infections. Test performance is not FDA approved in patients less than 2 years  old. Performed at [facility], [address], [place], [place] [address]     Labs: CBC: Recent Labs  Lab 11/23/23 2036 11/25/23 0534  WBC 11.0* 7.1  HGB 15.3 13.3  HCT 46.4 41.0  MCV 91.3 93.0  PLT 220 154   Basic Metabolic Panel: Recent Labs  Lab 11/23/23 2036 11/24/23 1147 11/25/23 0531 11/25/23 0534  NA 140  --   --  140  K 3.3*  --   --  3.8  CL 98  --   --  100  CO2 32  --   --  32  GLUCOSE 110*  --   --  129*  BUN 13  --   --  14  CREATININE 1.07  --   --  1.09  CALCIUM 8.3*  --   --  8.7*  MG  --  2.0 2.0  --    Liver Function Tests: No results for input(s): "AST", "ALT", "ALKPHOS", "BILITOT", "PROT", "ALBUMIN" in the last 168 hours. CBG: No results for input(s): "GLUCAP" in the last 168 hours.  Discharge time spent: greater than 30 minutes.  Signed: Thad Ranger, MD [facility] 11/26/2023

## 2023-11-26 NOTE — Plan of Care (Signed)
  Problem: Health Behavior/Discharge Planning: Goal: Ability to manage health-related needs will improve Outcome: Progressing   Problem: Clinical Measurements: Goal: Respiratory complications will improve Outcome: Progressing Goal: Cardiovascular complication will be avoided Outcome: Progressing   Problem: Nutrition: Goal: Adequate nutrition will be maintained Outcome: Progressing   Problem: Safety: Goal: Ability to remain free from injury will improve Outcome: Progressing

## 2023-12-02 DIAGNOSIS — J4489 Other specified chronic obstructive pulmonary disease: Secondary | ICD-10-CM | POA: Diagnosis not present

## 2023-12-02 DIAGNOSIS — Z720 Tobacco use: Secondary | ICD-10-CM | POA: Diagnosis not present

## 2023-12-02 DIAGNOSIS — G4733 Obstructive sleep apnea (adult) (pediatric): Secondary | ICD-10-CM | POA: Diagnosis not present

## 2023-12-02 DIAGNOSIS — I10 Essential (primary) hypertension: Secondary | ICD-10-CM | POA: Diagnosis not present

## 2023-12-02 DIAGNOSIS — Z9889 Other specified postprocedural states: Secondary | ICD-10-CM | POA: Diagnosis not present

## 2023-12-02 DIAGNOSIS — Z7901 Long term (current) use of anticoagulants: Secondary | ICD-10-CM | POA: Diagnosis not present

## 2023-12-02 DIAGNOSIS — I771 Stricture of artery: Secondary | ICD-10-CM | POA: Diagnosis not present

## 2023-12-02 DIAGNOSIS — I480 Paroxysmal atrial fibrillation: Secondary | ICD-10-CM | POA: Diagnosis not present

## 2023-12-02 DIAGNOSIS — J449 Chronic obstructive pulmonary disease, unspecified: Secondary | ICD-10-CM | POA: Diagnosis not present

## 2023-12-14 DIAGNOSIS — Z7901 Long term (current) use of anticoagulants: Secondary | ICD-10-CM | POA: Diagnosis not present

## 2023-12-14 DIAGNOSIS — I480 Paroxysmal atrial fibrillation: Secondary | ICD-10-CM | POA: Diagnosis not present

## 2023-12-25 ENCOUNTER — Observation Stay (HOSPITAL_COMMUNITY)
Admission: EM | Admit: 2023-12-25 | Discharge: 2023-12-28 | Disposition: A | Discharge status: Home / Self Care | Attending: Internal Medicine | Admitting: Internal Medicine

## 2023-12-25 ENCOUNTER — Other Ambulatory Visit: Payer: Self-pay

## 2023-12-25 ENCOUNTER — Emergency Department (HOSPITAL_COMMUNITY)

## 2023-12-25 ENCOUNTER — Encounter (HOSPITAL_COMMUNITY): Payer: Self-pay

## 2023-12-25 DIAGNOSIS — I2510 Atherosclerotic heart disease of native coronary artery without angina pectoris: Secondary | ICD-10-CM | POA: Diagnosis present

## 2023-12-25 DIAGNOSIS — E785 Hyperlipidemia, unspecified: Secondary | ICD-10-CM | POA: Diagnosis present

## 2023-12-25 DIAGNOSIS — I4891 Unspecified atrial fibrillation: Principal | ICD-10-CM | POA: Insufficient documentation

## 2023-12-25 DIAGNOSIS — Z79899 Other long term (current) drug therapy: Secondary | ICD-10-CM | POA: Diagnosis not present

## 2023-12-25 DIAGNOSIS — F419 Anxiety disorder, unspecified: Secondary | ICD-10-CM | POA: Diagnosis not present

## 2023-12-25 DIAGNOSIS — I5032 Chronic diastolic (congestive) heart failure: Secondary | ICD-10-CM | POA: Diagnosis present

## 2023-12-25 DIAGNOSIS — F411 Generalized anxiety disorder: Secondary | ICD-10-CM | POA: Diagnosis present

## 2023-12-25 DIAGNOSIS — Z7901 Long term (current) use of anticoagulants: Secondary | ICD-10-CM | POA: Insufficient documentation

## 2023-12-25 DIAGNOSIS — I6523 Occlusion and stenosis of bilateral carotid arteries: Secondary | ICD-10-CM | POA: Diagnosis not present

## 2023-12-25 DIAGNOSIS — I5033 Acute on chronic diastolic (congestive) heart failure: Secondary | ICD-10-CM | POA: Diagnosis not present

## 2023-12-25 DIAGNOSIS — I110 Hypertensive heart disease with heart failure: Secondary | ICD-10-CM | POA: Insufficient documentation

## 2023-12-25 DIAGNOSIS — I10 Essential (primary) hypertension: Secondary | ICD-10-CM | POA: Diagnosis not present

## 2023-12-25 DIAGNOSIS — I4892 Unspecified atrial flutter: Secondary | ICD-10-CM | POA: Diagnosis not present

## 2023-12-25 DIAGNOSIS — R002 Palpitations: Secondary | ICD-10-CM | POA: Diagnosis present

## 2023-12-25 DIAGNOSIS — Z7902 Long term (current) use of antithrombotics/antiplatelets: Secondary | ICD-10-CM | POA: Insufficient documentation

## 2023-12-25 DIAGNOSIS — I499 Cardiac arrhythmia, unspecified: Secondary | ICD-10-CM | POA: Diagnosis not present

## 2023-12-25 DIAGNOSIS — I739 Peripheral vascular disease, unspecified: Secondary | ICD-10-CM | POA: Diagnosis not present

## 2023-12-25 DIAGNOSIS — J4489 Other specified chronic obstructive pulmonary disease: Secondary | ICD-10-CM | POA: Diagnosis present

## 2023-12-25 DIAGNOSIS — Z87891 Personal history of nicotine dependence: Secondary | ICD-10-CM | POA: Insufficient documentation

## 2023-12-25 DIAGNOSIS — I6529 Occlusion and stenosis of unspecified carotid artery: Secondary | ICD-10-CM | POA: Diagnosis not present

## 2023-12-25 DIAGNOSIS — F32A Depression, unspecified: Secondary | ICD-10-CM | POA: Insufficient documentation

## 2023-12-25 DIAGNOSIS — Z951 Presence of aortocoronary bypass graft: Secondary | ICD-10-CM | POA: Diagnosis not present

## 2023-12-25 DIAGNOSIS — Z9889 Other specified postprocedural states: Secondary | ICD-10-CM

## 2023-12-25 DIAGNOSIS — I25118 Atherosclerotic heart disease of native coronary artery with other forms of angina pectoris: Secondary | ICD-10-CM | POA: Diagnosis not present

## 2023-12-25 DIAGNOSIS — R000 Tachycardia, unspecified: Secondary | ICD-10-CM | POA: Diagnosis not present

## 2023-12-25 HISTORY — DX: Acute pulmonary edema: J81.0

## 2023-12-25 HISTORY — DX: Acute respiratory failure with hypoxia: J96.01

## 2023-12-25 LAB — CBC
HCT: 44.8 % (ref 39.0–52.0)
Hemoglobin: 14.7 g/dL (ref 13.0–17.0)
MCH: 30.1 pg (ref 26.0–34.0)
MCHC: 32.8 g/dL (ref 30.0–36.0)
MCV: 91.8 fL (ref 80.0–100.0)
Platelets: 198 10*3/uL (ref 150–400)
RBC: 4.88 MIL/uL (ref 4.22–5.81)
RDW: 15.3 % (ref 11.5–15.5)
WBC: 11.7 10*3/uL — ABNORMAL HIGH (ref 4.0–10.5)
nRBC: 0 % (ref 0.0–0.2)

## 2023-12-25 LAB — PROTIME-INR
INR: 2.1 — ABNORMAL HIGH (ref 0.8–1.2)
Prothrombin Time: 24.1 s — ABNORMAL HIGH (ref 11.4–15.2)

## 2023-12-25 LAB — BASIC METABOLIC PANEL WITH GFR
Anion gap: 10 (ref 5–15)
BUN: 25 mg/dL — ABNORMAL HIGH (ref 8–23)
CO2: 28 mmol/L (ref 22–32)
Calcium: 8.9 mg/dL (ref 8.9–10.3)
Chloride: 101 mmol/L (ref 98–111)
Creatinine, Ser: 1.25 mg/dL — ABNORMAL HIGH (ref 0.61–1.24)
GFR, Estimated: >60 mL/min (ref >60)
Glucose, Bld: 116 mg/dL — ABNORMAL HIGH (ref 70–99)
Potassium: 4.3 mmol/L (ref 3.5–5.1)
Sodium: 139 mmol/L (ref 135–145)

## 2023-12-25 LAB — MAGNESIUM: Magnesium: 2 mg/dL (ref 1.7–2.4)

## 2023-12-25 MED ORDER — FUROSEMIDE 20 MG PO TABS: 20.0000 mg | ORAL_TABLET | Freq: Every day | ORAL | Status: DC

## 2023-12-25 MED ORDER — FUROSEMIDE 40 MG PO TABS
40.0000 mg | ORAL_TABLET | Freq: Every day | ORAL | Status: DC
  Administered 2023-12-26 – 2023-12-27 (×2): 40 mg via ORAL
  Filled 2023-12-25 (×2): qty 1

## 2023-12-25 MED ORDER — DILTIAZEM HCL ER COATED BEADS 180 MG PO CP24
360.0000 mg | ORAL_CAPSULE | Freq: Every day | ORAL | Status: DC
Start: 2023-12-26 — End: 2023-12-26

## 2023-12-25 MED ORDER — WARFARIN - PHARMACIST DOSING INPATIENT: Freq: Every day | Status: DC

## 2023-12-25 MED ORDER — LEVALBUTEROL HCL 1.25 MG/0.5ML IN NEBU: 1.2500 mg | INHALATION_SOLUTION | Freq: Four times a day (QID) | RESPIRATORY_TRACT | Status: DC | PRN

## 2023-12-25 MED ORDER — ATORVASTATIN CALCIUM 80 MG PO TABS
80.0000 mg | ORAL_TABLET | Freq: Every day | ORAL | Status: DC
  Administered 2023-12-26 – 2023-12-27 (×2): 80 mg via ORAL
  Filled 2023-12-25 (×2): qty 1

## 2023-12-25 MED ORDER — SPIRONOLACTONE 12.5 MG HALF TABLET
12.5000 mg | ORAL_TABLET | Freq: Every day | ORAL | Status: DC
  Administered 2023-12-26 – 2023-12-28 (×3): 12.5 mg via ORAL
  Filled 2023-12-25 (×3): qty 1

## 2023-12-25 MED ORDER — UMECLIDINIUM BROMIDE 62.5 MCG/ACT IN AEPB
1.0000 | INHALATION_SPRAY | Freq: Every day | RESPIRATORY_TRACT | Status: DC
  Administered 2023-12-26 – 2023-12-27 (×2): 1 via RESPIRATORY_TRACT
  Filled 2023-12-25: qty 7

## 2023-12-25 MED ORDER — POLYETHYLENE GLYCOL 3350 17 G PO PACK: 17.0000 g | PACK | Freq: Every day | ORAL | Status: DC | PRN

## 2023-12-25 MED ORDER — DIAZEPAM 5 MG PO TABS
5.0000 mg | ORAL_TABLET | Freq: Every day | ORAL | Status: DC
  Administered 2023-12-25 – 2023-12-27 (×3): 5 mg via ORAL
  Filled 2023-12-25 (×3): qty 1

## 2023-12-25 MED ORDER — ACETAMINOPHEN 325 MG PO TABS: 650.0000 mg | ORAL_TABLET | Freq: Four times a day (QID) | ORAL | Status: DC | PRN

## 2023-12-25 MED ORDER — DILTIAZEM HCL-DEXTROSE 125-5 MG/125ML-% IV SOLN (PREMIX)
5.0000 mg/h | INTRAVENOUS | Status: DC
  Administered 2023-12-25: 5 mg/h via INTRAVENOUS
  Filled 2023-12-25: qty 125

## 2023-12-25 MED ORDER — FUROSEMIDE 20 MG PO TABS
20.0000 mg | ORAL_TABLET | Freq: Every evening | ORAL | Status: DC
  Administered 2023-12-26 – 2023-12-27 (×2): 20 mg via ORAL
  Filled 2023-12-25 (×2): qty 1

## 2023-12-25 MED ORDER — SODIUM CHLORIDE 0.9% FLUSH
3.0000 mL | Freq: Two times a day (BID) | INTRAVENOUS | Status: DC
  Administered 2023-12-25 – 2023-12-27 (×5): 3 mL via INTRAVENOUS

## 2023-12-25 MED ORDER — FLUTICASONE FUROATE-VILANTEROL 100-25 MCG/ACT IN AEPB
1.0000 | INHALATION_SPRAY | Freq: Every day | RESPIRATORY_TRACT | Status: DC
  Administered 2023-12-26 – 2023-12-27 (×2): 1 via RESPIRATORY_TRACT
  Filled 2023-12-25: qty 28

## 2023-12-25 MED ORDER — CLOPIDOGREL BISULFATE 75 MG PO TABS
75.0000 mg | ORAL_TABLET | Freq: Every day | ORAL | Status: DC
  Administered 2023-12-25 – 2023-12-27 (×3): 75 mg via ORAL
  Filled 2023-12-25 (×3): qty 1

## 2023-12-25 MED ORDER — SODIUM CHLORIDE 0.9 % IV BOLUS
500.0000 mL | Freq: Once | INTRAVENOUS | Status: AC
  Administered 2023-12-25: 500 mL via INTRAVENOUS

## 2023-12-25 MED ORDER — ACETAMINOPHEN 650 MG RE SUPP: 650.0000 mg | Freq: Four times a day (QID) | RECTAL | Status: DC | PRN

## 2023-12-25 MED ORDER — WARFARIN SODIUM 2 MG PO TABS
2.0000 mg | ORAL_TABLET | Freq: Once | ORAL | Status: AC
  Administered 2023-12-25: 2 mg via ORAL
  Filled 2023-12-25: qty 1

## 2023-12-25 NOTE — H&P (Signed)
 History and Physical   Dennis Preston ZOX:[id number] DOB: Nov 29, 1950 DOA: 12/25/2023  PCP: Barbette Reichmann, MD   Patient coming from: [facility]  Chief Complaint: Halina Maidens, RVR  HPI: Dennis Preston is a 73 y.o. male with medical history significant of hypertension, hyperlipidemia, atrial flutter/atrial fibrillation, PAD status post bifemoral bypass, CAD status post CABG, carotid disease status post endarterectomy, chronic diastolic CHF, COPD, anxiety, depression presenting with atrial fibrillation/flutter with RVR.  Patient was admitted 2/24-2/27 with atrial flutter with RVR.  Improved with diltiazem.  Was seen by cardiology but unable to cardiovert due to being subtherapeutic with his INR.  Recommended for outpatient follow-up.  It appears he has followed up outpatient and has remained on current treatment.  He has been following up with the warfarin clinic and his INR has remained under 2 until checked in the ED today at 2.1.  He felt unwell starting yesterday after using a nebulizer treatment.  Today he went to the fire station to be checked out and was noted to be in atrial fibrillation with RVR in the 150s-160s.  EMS was called and patient received 500 cc IV fluid and some IV diltiazem with heart rate subsequently in the 60s-120s.  On arrival to the ED placed on a diltiazem drip with improvement in heart rates but continues to be in atrial flutter.   denies fevers, chills, chest pain, shortness of breath, abdominal pain, constipation, diarrhea, nausea, vomiting.  ED Course: Vital signs in the ED notable for heart rate in the 100s, blood pressure in the 130s-150s systolic, respirate in the 20s, saturating well on room air.  Lab workup included BMP with creatinine 1.25 which is near baseline of 1, BUN 25, glucose 116.  CBC with mild leukocytosis to 11.7.  PT 24.1, INR 2.1.  Magnesium pending.  Chest x-ray without acute abnormality.  Started on diltiazem infusion in the ED.  Review of Systems: As  per HPI otherwise all other systems reviewed and are negative.  Past Medical History:  Diagnosis Date   Acute diastolic heart failure (HCC) 09/14/2022   Acute metabolic encephalopathy 09/12/2022   Acute pulmonary edema (HCC)    Acute respiratory failure with hypoxia and hypercarbia (HCC)    AKI (acute kidney injury) (HCC) 08/25/2023   Alcohol abuse    Amaurosis fugax    Aortic atherosclerosis (HCC)    Atrial fibrillation (HCC)    Carotid stenosis    CHF (congestive heart failure) (HCC)    Coagulopathy (HCC)    COPD (chronic obstructive pulmonary disease) (HCC)    Coronary artery disease    Depression    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, SVG-OM1, SVG-PDA, SVG-PL   Hyperlipemia    Hypertension    Hypotension 07/14/2015   Leucocytosis    Liver dysfunction    Long term current use of clopidogrel    Non-ST elevation (NSTEMI) myocardial infarction (HCC) 07/10/2015   NSTEMI (non-ST elevated myocardial infarction) (HCC)    approx 2016   Peripheral artery disease (HCC)    legs   Tobacco abuse    Wears dentures    full upper and lower    Past Surgical History:  Procedure Laterality Date   APPLICATION OF WOUND VAC Right 06/22/2020   Procedure: APPLICATION OF WOUND VAC;  Surgeon: Annice Needy, MD;  Location: [facility];  Service: Vascular;  Laterality: Right;   CARDIAC CATHETERIZATION N/A 07/10/2015   Procedure: Right/Left Heart Cath and Coronary Angiography;  Surgeon: Alwyn Pea, MD;  Location: [facility]  INVASIVE CV LAB;  Service: Cardiovascular;  Laterality: N/A;   CARDIOVERSION N/A 10/14/2022   Procedure: CARDIOVERSION;  Surgeon: Alwyn Pea, MD;  Location: [facility];  Service: Cardiovascular;  Laterality: N/A;   CORONARY ARTERY BYPASS GRAFT  07/13/2015   [facility].  4 vessel   ENDARTERECTOMY Right 01/02/2021   Procedure: ENDARTERECTOMY CAROTID;  Surgeon: Annice Needy, MD;  Location: [facility];  Service: Vascular;  Laterality: Right;   ENDARTERECTOMY FEMORAL Bilateral 05/23/2020    Procedure: ENDARTERECTOMY FEMORAL BILATERAL SUPERFICIAL FEMORAL ARTERY STENTS ;  Surgeon: Renford Dills, MD;  Location: [facility];  Service: Vascular;  Laterality: Bilateral;   FALSE ANEURYSM REPAIR Left 07/22/2020   Procedure: REPAIR LEFT FEMORAL PSEUDOANUERYSM;  Surgeon: Bertram Denver, MD;  Location: [facility];  Service: Vascular;  Laterality: Left;   INSERTION OF ILIAC STENT Left 05/23/2020   Procedure: INSERTION OF ILIAC STENT;  Surgeon: Renford Dills, MD;  Location: [facility];  Service: Vascular;  Laterality: Left;   LEFT HEART CATH AND CORONARY ANGIOGRAPHY N/A 09/16/2022   Procedure: LEFT HEART CATH AND CORONARY ANGIOGRAPHY;  Surgeon: Lennette Bihari, MD;  Location: [facility] INVASIVE CV LAB;  Service: Cardiovascular;  Laterality: N/A;   LEFT HEART CATH AND CORS/GRAFTS ANGIOGRAPHY N/A 01/23/2020   Procedure: LEFT HEART CATH AND CORS/GRAFTS ANGIOGRAPHY;  Surgeon: Alwyn Pea, MD;  Location: [facility] INVASIVE CV LAB;  Service: Cardiovascular;  Laterality: N/A;   LEFT HEART CATH AND CORS/GRAFTS ANGIOGRAPHY N/A 02/18/2022   Procedure: LEFT HEART CATH AND CORS/GRAFTS ANGIOGRAPHY;  Surgeon: Armando Reichert, MD;  Location: [facility] INVASIVE CV LAB;  Service: Cardiovascular;  Laterality: N/A;   LOWER EXTREMITY ANGIOGRAPHY Right 04/03/2020   Procedure: LOWER EXTREMITY ANGIOGRAPHY;  Surgeon: Renford Dills, MD;  Location: [facility] INVASIVE CV LAB;  Service: Cardiovascular;  Laterality: Right;   OLECRANON BURSECTOMY Left 05/06/2019   Procedure: OLECRANON BURSECTOMY AND DEBRIDEMENT;  Surgeon: Signa Kell, MD;  Location: [facility];  Service: Orthopedics;  Laterality: Left;   WOUND DEBRIDEMENT Right 06/22/2020   Procedure: DEBRIDEMENT WOUND RIGHT GROIN;  Surgeon: Annice Needy, MD;  Location: [facility];  Service: Vascular;  Laterality: Right;    Social History  reports that he has quit smoking. His smoking use included cigarettes. He has a 135 pack-year smoking history. He has never used smokeless tobacco. He  reports that he does not currently use alcohol. He reports that he does not use drugs.  No Known Allergies  Family History  Problem Relation Age of Onset   Depression Sister   Reviewed on admission  Prior to Admission medications   Medication Sig Start Date End Date Taking? Authorizing Provider  atorvastatin (LIPITOR) 80 MG tablet Take 1 tablet (80 mg total) by mouth daily at 6 PM. 07/10/15   Sainani, Rolly Pancake, MD  clopidogrel (PLAVIX) 75 MG tablet Take 75 mg by mouth at bedtime. 04/18/16   [provider]  colchicine 0.6 MG tablet Take 0.6 mg by mouth daily as needed (gout flare).    [provider]  diazepam (VALIUM) 5 MG tablet Take 1 tablet (5 mg total) by mouth at bedtime. 08/26/23   Erick Alley, DO  diltiazem (CARDIZEM CD) 360 MG 24 hr capsule Take 1 capsule (360 mg total) by mouth daily. 11/27/23   Rai, Delene Ruffini, MD  Fluticasone-Umeclidin-Vilant (TRELEGY ELLIPTA) 100-62.5-25 MCG/INH AEPB Inhale 1 puff into the lungs daily. 01/27/20   [provider]  furosemide (LASIX) 40 MG tablet Take 40 mg in the morning and 20  mg (1/2 tablet) in the evening 08/26/23   Erick Alley, DO  levalbuterol Pauline Aus) 1.25 MG/3ML nebulizer solution Take 1.25 mg by nebulization daily.    [provider]  OXYGEN Inhale 2 L into the lungs at bedtime as needed (02 sats < 90).    [provider]  spironolactone (ALDACTONE) 25 MG tablet Take 0.5 tablets (12.5 mg total) by mouth daily. 11/26/23   Rai, Delene Ruffini, MD  warfarin (COUMADIN) 1 MG tablet Take 1.5 mg by mouth daily. 11/10/23   [provider]    Physical Exam: Vitals:   12/25/23 1745 12/25/23 1800 12/25/23 1815 12/25/23 1830  BP: (!) 138/56 (!) 155/58 139/68 133/65  Pulse: (!) 45 62 67 67  Resp: (!) 22 (!) 23 20 (!) 21  Temp:      TempSrc:      SpO2: (!) 89% 95% 100% 98%  Weight:      Height:        Physical Exam Constitutional:      General: He is not in acute distress.    Appearance:  Normal appearance.  HENT:     Head: Normocephalic and atraumatic.     Mouth/Throat:     Mouth: Mucous membranes are moist.     Pharynx: Oropharynx is clear.  Eyes:     Extraocular Movements: Extraocular movements intact.     Pupils: Pupils are equal, round, and reactive to light.  Cardiovascular:     Rate and Rhythm: Regular rhythm. Tachycardia present.     Pulses: Normal pulses.     Heart sounds: Normal heart sounds.  Pulmonary:     Effort: Pulmonary effort is normal. No respiratory distress.     Breath sounds: Normal breath sounds.  Abdominal:     General: Bowel sounds are normal. There is no distension.     Palpations: Abdomen is soft.     Tenderness: There is no abdominal tenderness.  Musculoskeletal:        General: No swelling or deformity.  Skin:    General: Skin is warm and dry.  Neurological:     General: No focal deficit present.     Mental Status: Mental status is at baseline.    Labs on Admission: I have personally reviewed following labs and imaging studies  CBC: Recent Labs  Lab 12/25/23 1725  WBC 11.7*  HGB 14.7  HCT 44.8  MCV 91.8  PLT 198    Basic Metabolic Panel: Recent Labs  Lab 12/25/23 1725  NA 139  K 4.3  CL 101  CO2 28  GLUCOSE 116*  BUN 25*  CREATININE 1.25*  CALCIUM 8.9    GFR: Estimated Creatinine Clearance: 46.5 mL/min (A) (by C-G formula based on SCr of 1.25 mg/dL (H)).  Liver Function Tests: No results for input(s): "AST", "ALT", "ALKPHOS", "BILITOT", "PROT", "ALBUMIN" in the last 168 hours.  Urine analysis:    Component Value Date/Time   COLORURINE YELLOW 07/03/2022 0819   APPEARANCEUR CLEAR 07/03/2022 0819   LABSPEC 1.025 07/03/2022 0819   PHURINE 6.0 07/03/2022 0819   GLUCOSEU NEGATIVE 07/03/2022 0819   HGBUR TRACE (A) 07/03/2022 0819   BILIRUBINUR NEGATIVE 07/03/2022 0819   KETONESUR NEGATIVE 07/03/2022 0819   PROTEINUR 100 (A) 07/03/2022 0819   UROBILINOGEN 0.2 05/10/2008 1654   NITRITE NEGATIVE 07/03/2022  0819   LEUKOCYTESUR NEGATIVE 07/03/2022 0819    Radiological Exams on Admission: DG Chest Port 1 View Result Date: 12/25/2023 CLINICAL DATA:  Atrial fibrillation. EXAM: PORTABLE CHEST 1 VIEW COMPARISON:  November 23, 2023. FINDINGS: The heart size and mediastinal contours are within normal limits. Status post coronary artery bypass graft. Both lungs are clear. The visualized skeletal structures are unremarkable. IMPRESSION: No active disease. Electronically Signed   By: Lupita Raider M.D.   On: 12/25/2023 18:12   EKG: Independently reviewed.  Atrial flutter at 104 bpm.  Nonspecific T wave changes.  Assessment/Plan Active Problems:   Anxiety state   Carotid atherosclerosis, bilateral   COPD (chronic obstructive pulmonary disease) with chronic bronchitis (HCC)   Coronary artery disease   PAD (peripheral artery disease) (HCC)   S/P coronary artery bypass graft x 4   Essential hypertension   Hyperlipidemia   S/P carotid endarterectomy   Chronic heart failure with preserved ejection fraction (HFpEF) (HCC)   Atrial fibrillation and flutter (HCC)   Atrial flutter with RVR  > Known history of atrial fibrillation and atrial flutter with recent admission for atrial flutter with RVR, unable to be cardioverted due to subtherapeutic RVR.  Continues to have issues with subtherapeutic RVR. > Started feeling well yesterday after taking albuterol treatment.  Was seen at fire department today and noted to be in A-fib with RVR to the 150s.  EMS called and patient received IV fluids and IV diltiazem with some improvement but remained tachycardic.  Placed on diltiazem infusion in the ED with improvement in heart rate. - Monitor on progressive unit overnight - Continue with diltiazem infusion - Warfarin per pharmacy - Hold off on echocardiogram as he had one done 4 months ago - Resume home diltiazem tomorrow  Hypertension - Continue home diltiazem tomorrow - Continue home Lasix,  spironolactone  Hyperlipidemia - Continue atorvastatin  PAD > Status post  aortobifem bypass - Continue home Plavix - Warfarin as above - Continue home atorvastatin  CAD > Status post CABG - Continue home Plavix, atorvastatin - Warfarin as above  Carotid artery disease > Status post endarterectomy - Continue Plavix, atorvastatin - Warfarin as above  Chronic diastolic CHF > Last echo was in November 2024 with EF 55-60%, indeterminate diastolic function, mildly reduced RV function.  COPD  > Appears to his have history of oxygen use at night as well. - Replace home Trelegy with formulary Breo and Incruse - As needed Xopenex  Anxiety Depression - Continue home diazepam  DVT prophylaxis: Warfarin Code Status:   Full Family Communication:  Updated at bedside  Disposition Plan:   Patient is from:  Home  Anticipated DC to:  Home  Anticipated DC date:  1 to 2 days  Anticipated DC barriers: None  Consults called:  None Admission status:  Observation, progressive  Severity of Illness: The appropriate patient status for this patient is OBSERVATION. Observation status is judged to be reasonable and necessary in order to provide the required intensity of service to ensure the patient's safety. The patient's presenting symptoms, physical exam findings, and initial radiographic and laboratory data in the context of their medical condition is felt to place them at decreased risk for further clinical deterioration. Furthermore, it is anticipated that the patient will be medically stable for discharge from the hospital within 2 midnights of admission.    Synetta Fail MD [facility]  How to contact the [facility] Attending or Consulting provider 7A - 7P or covering provider during after hours 7P -7A, for this patient?   Check the care team in [facility] and look for a) attending/consulting [facility] provider listed and b) the [facility] team listed Log into [email or web address] and use  [place]'s  universal password to access. If you do not have the password, please contact the hospital operator. Locate the [facility] provider you are looking for under [facility] and page to a number that you can be directly reached. If you still have difficulty reaching the provider, please page the [facility] (Director on Call) for the Hospitalists listed on amion for assistance.  12/25/2023, 6:59 PM

## 2023-12-25 NOTE — ED Provider Notes (Signed)
 Emergency Department Provider Note   I have reviewed the triage vital signs and the nursing notes.   HISTORY  Chief Complaint Atrial Fibrillation   HPI Dennis Preston is a 73 y.o. male past history of CHF, COPD, atrial fibrillation on Coumadin due to inability to afford DOAC presents with generalized weakness/fatigue and palpitations.  Symptoms began last night after he used his albuterol nebulizer.  He was having some wheezing and since that time felt poorly.  He ultimately went to the fire department today where he was found to be in atrial flutter with RVR and heart rate into the 160s.  He was given 10 mg of diltiazem prior to arrival with some mild improvement in symptoms.  No chest discomfort.  Patient tells me that has been compliant with his Coumadin but since discharge from the hospital in February his INR has been below 2.  He has been taking an increased dose of Coumadin for the past week with hopes of getting it above 2.0, but since discharge has not been successful with this.    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 Positive palpitations and lightheadedness.  Respiratory: Denies shortness of breath. Gastrointestinal: No abdominal pain.  No nausea, no vomiting.   Skin: Negative for rash. Neurological: Negative for headaches.  ____________________________________________   PHYSICAL EXAM:  VITAL SIGNS: ED Triage Vitals  Encounter Vitals Group     BP 12/25/23 1727 (!) 156/55     Pulse Rate 12/25/23 1727 (!) 106     Resp 12/25/23 1727 20     Temp 12/25/23 1727 97.7 F (36.5 C)     Temp Source 12/25/23 1727 Oral     SpO2 12/25/23 1727 93 %     Weight 12/25/23 1726 157 lb (71.2 kg)     Height 12/25/23 1726 5\' 5"  (1.651 m)   Constitutional: Alert and oriented. Well appearing and in no acute distress. Eyes: Conjunctivae are normal.  Head: Atraumatic. Nose: No congestion/rhinnorhea. Neck: No stridor.  Cardiovascular: Tachycardia. Good peripheral circulation. Grossly normal heart sounds.   Respiratory: Normal respiratory effort.  No retractions. Lungs CTAB. Gastrointestinal: Soft and nontender. No distention.  Musculoskeletal: No gross deformities of extremities. Neurologic:  Normal speech and language.  Skin:  Skin is warm, dry and intact. No rash noted.   ____________________________________________  LABS (all labs ordered are listed, but only abnormal results are displayed)  Labs Reviewed  BASIC METABOLIC PANEL WITH GFR - Abnormal; Notable for the following components:      Result Value   Glucose, Bld 116 (*)    BUN 25 (*)    Creatinine, Ser 1.25 (*)    All other components within normal limits  CBC - Abnormal; Notable for the following components:   WBC 11.7 (*)    All other components within normal limits  PROTIME-INR - Abnormal; Notable for the following components:   Prothrombin Time 24.1 (*)     INR 2.1 (*)    All other components within normal limits  COMPREHENSIVE METABOLIC PANEL WITH GFR - Abnormal; Notable for the following components:   Glucose, Bld 122 (*)    Calcium 8.5 (*)    Total Protein 5.2 (*)    Albumin 3.2 (*)    All other components within normal limits  PROTIME-INR - Abnormal; Notable for the following components:   Prothrombin Time 24.1 (*)    INR 2.1 (*)    All other components within normal limits  CBC - Abnormal; Notable for the following components:   RDW 15.8 (*)    All other components within normal limits  RENAL FUNCTION PANEL - Abnormal; Notable for the following components:   Glucose, Bld 113 (*)    Calcium 8.6 (*)    Albumin 3.2 (*)    All other components within normal limits  PROTIME-INR - Abnormal; Notable for the following components:   Prothrombin Time 24.9 (*)    INR 2.2 (*)    All other components within normal limits  RENAL FUNCTION PANEL - Abnormal; Notable for the following components:   CO2 33 (*)    Calcium 8.7 (*)    Albumin 3.3 (*)    All other components within normal limits  PROTIME-INR - Abnormal; Notable for the following components:   Prothrombin Time 24.4 (*)    INR 2.2 (*)    All other components within normal limits  MAGNESIUM  CBC  CBC   ____________________________________________  EKG   EKG Interpretation Date/Time:  Friday December 25 2023 17:26:23 EDT Ventricular Rate:  104 PR Interval:    QRS Duration:  122 QT Interval:  403 QTC Calculation: 445 R Axis:   83  Text Interpretation: Atrial flutter Right bundle branch block Abnormal T, consider ischemia, diffuse leads Minimal ST elevation, inferior leads Confirmed by Alona Bene [phone]) on 12/25/2023 5:33:19 PM        ____________________________________________  RADIOLOGY  ECHO TEE Result Date: 12/28/2023    TRANSESOPHOGEAL ECHO REPORT   Patient Name:   Dennis Preston Date of Exam: 12/28/2023 Medical Rec #:  [id number]       Height:       65.0 in  Accession #:    [id number]      Weight:       153.9 lb Date of Birth:  [birth year]      BSA:          1.770 m Patient Age:    72 years        BP:           139/63 mmHg Patient Gender: M               HR:           60 bpm. Exam Location:  Inpatient Procedure: Transesophageal Echo, 3D Echo, Color Doppler and Cardiac Doppler            (  Both Spectral and Color Flow Doppler were utilized during            procedure). Indications:    I48.92* Unspecified atrial flutter  History:        Patient has prior history of Echocardiogram examinations, most                 recent 08/25/2023. CAD, COPD and PAD, Arrythmias:Atrial                 Fibrillation; Risk Factors:Hypertension and Dyslipidemia.  Sonographer:    Rosaland Lao Sonographer#2:  Irving Burton Senior RDCS Referring Phys: [id number] Corrin Parker PROCEDURE: After discussion of the risks and benefits of a TEE, an informed consent was obtained from the patient. TEE procedure time was 8 minutes. The transesophogeal probe was passed without difficulty through the esophogus of the patient. Imaged were  obtained with the patient in a left lateral decubitus position. Sedation performed by different physician. The patient was monitored while under deep sedation. Anesthestetic sedation was provided intravenously by Anesthesiology: 120mg  of Propofol, 60mg  of Lidocaine. Image quality was excellent. The patient's vital signs; including heart rate, blood pressure, and oxygen saturation; remained stable throughout the procedure. The patient developed no complications during the procedure. A successful direct current cardioversion was performed at 200 joules with 1 attempt.  IMPRESSIONS  1. Left ventricular ejection fraction, by estimation, is 55 to 60%. Left ventricular ejection fraction by 3D volume is 58 %. The left ventricle has normal function.  2. Right ventricular systolic function is normal. The right ventricular size is normal.  3. Left atrial size was mild to moderately  dilated. No left atrial/left atrial appendage thrombus was detected. The LAA emptying velocity was 52 cm/s.  4. Right atrial size was mildly dilated.  5. The mitral valve is grossly normal. Trivial mitral valve regurgitation. No evidence of mitral stenosis.  6. The aortic valve is tricuspid. Aortic valve regurgitation is mild. No aortic stenosis is present.  7. There is mild (Grade II) plaque involving the descending aorta and aortic arch.  8. 3D performed for the EF and 3D performed of the LAA and demonstrates 3D LVEF normal. 3D LAA without thrombus. Conclusion(s)/Recommendation(s): No LA/LAA thrombus identified. Successful cardioversion performed with restoration of normal sinus rhythm. FINDINGS  Left Ventricle: Left ventricular ejection fraction, by estimation, is 55 to 60%. Left ventricular ejection fraction by 3D volume is 58 %. The left ventricle has normal function. The left ventricular internal cavity size was normal in size. Right Ventricle: The right ventricular size is normal. No increase in right ventricular wall thickness. Right ventricular systolic function is normal. Left Atrium: Left atrial size was mild to moderately dilated. No left atrial/left atrial appendage thrombus was detected. The LAA emptying velocity was 52 cm/s. Right Atrium: Right atrial size was mildly dilated. Pericardium: There is no evidence of pericardial effusion. Mitral Valve: The mitral valve is grossly normal. Trivial mitral valve regurgitation. No evidence of mitral valve stenosis. Tricuspid Valve: The tricuspid valve is grossly normal. Tricuspid valve regurgitation is mild . No evidence of tricuspid stenosis. Aortic Valve: The aortic valve is tricuspid. Aortic valve regurgitation is mild. No aortic stenosis is present. Pulmonic Valve: The pulmonic valve was grossly normal. Pulmonic valve regurgitation is trivial. No evidence of pulmonic stenosis. Aorta: The aortic root and ascending aorta are structurally normal, with no  evidence of dilitation. There is mild (Grade II) plaque involving the descending aorta and aortic arch. IAS/Shunts: The atrial septum is grossly normal.  Additional Comments: Spectral Doppler performed.  3D Volume EF LV 3D EF:    Left ventricular ejection fraction by 3D volume is 58              %.  3D Volume EF LV 3D EF:    58.30 % LV 3D EDV:   45700.00 mm LV 3D ESV:   19100.00 mm LV 3D SV:    26700.00 mm  3D Volume EF: 3D EF:        58 %  AORTA Ao Root diam: 3.29 cm Ao Asc diam:  3.05 cm Lennie Odor MD Electronically signed by Lennie Odor MD Signature Date/Time: 12/28/2023/12:17:58 PM    Final    EP STUDY Result Date: 12/28/2023 See surgical note for result.   ____________________________________________   PROCEDURES  Procedure(s) performed:   Procedures  CRITICAL CARE Performed by: Maia Plan Total critical care time: 35 minutes Critical care time was exclusive of separately billable procedures and treating other patients. Critical care was necessary to treat or prevent imminent or life-threatening deterioration. Critical care was time spent personally by me on the following activities: development of treatment plan with patient and/or surrogate as well as nursing, discussions with consultants, evaluation of patient's response to treatment, examination of patient, obtaining history from patient or surrogate, ordering and performing treatments and interventions, ordering and review of laboratory studies, ordering and review of radiographic studies, pulse oximetry and re-evaluation of patient's condition.  Alona Bene, MD Emergency Medicine  ____________________________________________   INITIAL IMPRESSION / ASSESSMENT AND PLAN / ED COURSE  Pertinent labs & imaging results that were available during my care of the patient were reviewed by me and considered in my medical decision making (see chart for details).   This patient is Presenting for Evaluation of palpitations, which  does require a range of treatment options, and is a complaint that involves a high risk of morbidity and mortality.  The Differential Diagnoses include atrial flutter, a fib, NSVT, PE, ACS, CHF, etc.  Critical Interventions-    Medications  spironolactone (ALDACTONE) tablet 12.5 mg ( Oral MAR Unhold 12/28/23 1218)  atorvastatin (LIPITOR) tablet 80 mg ( Oral MAR Unhold 12/28/23 1218)  clopidogrel (PLAVIX) tablet 75 mg ( Oral MAR Unhold 12/28/23 1218)  diazepam (VALIUM) tablet 5 mg ( Oral MAR Unhold 12/28/23 1218)  fluticasone furoate-vilanterol (BREO ELLIPTA) 100-25 MCG/ACT 1 puff ( Inhalation MAR Unhold 12/28/23 1218)    And  umeclidinium bromide (INCRUSE ELLIPTA) 62.5 MCG/ACT 1 puff ( Inhalation MAR Unhold 12/28/23 1218)  levalbuterol (XOPENEX) nebulizer solution 1.25 mg ( Nebulization MAR Unhold 12/28/23 1218)  sodium chloride flush (NS) 0.9 % injection 3 mL ( Intravenous MAR Unhold 12/28/23 1218)  acetaminophen (TYLENOL) tablet 650 mg ( Oral MAR Unhold 12/28/23 1218)    Or  acetaminophen (TYLENOL) suppository 650 mg ( Rectal MAR Unhold 12/28/23 1218)  polyethylene glycol (MIRALAX / GLYCOLAX) packet 17 g ( Oral MAR Unhold 12/28/23 1218)  Warfarin - Pharmacist Dosing Inpatient ( Does not apply MAR Unhold 12/28/23 1218)  furosemide (LASIX) tablet 40 mg ( Oral MAR Unhold 12/28/23 1218)    And  furosemide (LASIX) tablet 20 mg ( Oral MAR Unhold 12/28/23 1218)  diltiazem (CARDIZEM) tablet 90 mg ( Oral MAR Unhold 12/28/23 1218)  sodium chloride 0.9 % bolus 500 mL (0 mLs Intravenous Stopped 12/26/23 1134)  warfarin (COUMADIN) tablet 2 mg (2 mg Oral Given 12/25/23 2243)  warfarin (COUMADIN) tablet 1.5 mg (1.5 mg Oral Given 12/26/23 1625)  warfarin (COUMADIN) tablet  1.5 mg (1.5 mg Oral Given 12/27/23 1654)    Reassessment after intervention: HR improved.   I decided to review pertinent External Data, and in summary patient with A fib RVR in February. Normal TSH at that time.    Clinical Laboratory Tests  Ordered, included INR 2.1 today.  Creatinine 1.25.  CBC without anemia.  Radiologic Tests Ordered, included CXR. I independently interpreted the images and agree with radiology interpretation.   Cardiac Monitor Tracing which shows Atrial flutter   Social Determinants of Health Risk patient is not an active smoker.   Consult complete with [facility]. Plan for admit.   Medical Decision Making: Summary:  Patient presents emergency department with palpitations and lightheadedness.  Found to be in a flutter with RVR and given diltiazem prior to arrival.  I have started him on diltiazem infusion.  His INR is 2.1 currently but apparently has been subtherapeutic for the past several weeks, since his discharge in February.  Rate controlled into the 70s but remains in flutter with some continued weakness feelings.   Reevaluation with update and discussion with patient. Rate improved. Plan for admit.   Patient's presentation is most consistent with acute presentation with potential threat to life or bodily function.   Disposition: admit  ____________________________________________  FINAL CLINICAL IMPRESSION(S) / ED DIAGNOSES  Final diagnoses:  Atrial flutter with rapid ventricular response (HCC)    Note:  This document was prepared using Dragon voice recognition software and may include unintentional dictation errors.  Alona Bene, MD, [facility] Emergency Medicine    Wei Poplaski, Arlyss Repress, MD 12/28/23 [phone]

## 2023-12-25 NOTE — ED Triage Notes (Addendum)
 Pt to ED via [facility] , pt took himself to fire station d/t not feeling well and noted to be in AFIB RVR rate 150s and EMS was called.  Pt reports has felt bad since last night.   Hx A.fib. takes blood thinner.  #18 LAC : Cardizem 10MG  given by EMS.after cardizem rate ranges between 60-120bpm.  - Fluid bolus.   Last VS: 173/108, 93%RA

## 2023-12-25 NOTE — ED Notes (Signed)
Report given to accepting nurse on 6E

## 2023-12-25 NOTE — ED Notes (Signed)
 This nurse called 6E to check on status of patient's room. Staff stated that they are not ready just yet and stated they will call when ready. This nurse gave a call back number.

## 2023-12-25 NOTE — Progress Notes (Signed)
 PHARMACY - ANTICOAGULATION CONSULT NOTE  Pharmacy Consult for warfarin  Indication: atrial fibrillation  No Known Allergies  Patient Measurements: Height: 5\' 5"  (165.1 cm) Weight: 71.2 kg (157 lb) IBW/kg (Calculated) : 61.5 HEPARIN DW (KG): 71.2  Vital Signs: Temp: 97.7 F (36.5 C) (03/28 1727) Temp Source: Oral (03/28 1727) BP: 120/62 (03/28 1930) Pulse Rate: 67 (03/28 1930)  Labs: Recent Labs    12/25/23 1725  HGB 14.7  HCT 44.8  PLT 198  LABPROT 24.1*  INR 2.1*  CREATININE 1.25*    Estimated Creatinine Clearance: 46.5 mL/min (A) (by C-G formula based on SCr of 1.25 mg/dL (H)).   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 On warfarin PTA, last dose 3/27 PM, patient taking 2mg  Mon-Fri and 1.5mg  on Sat/Sun. HgB 14.7 and PLTs 198. INR today 2.1.   Goal of Therapy:  INR 2-3 Monitor platelets by anticoagulation protocol: Yes   Plan:  INR therapeutic  today - will dose with home warfarin 2mg .  Daily INR checks.   Estill Batten, PharmD, BCCCP  12/25/2023,7:44 PM

## 2023-12-25 NOTE — ED Notes (Signed)
 Pt placed on O2 for sats of 88% while on room air

## 2023-12-25 NOTE — ED Notes (Signed)
 ED TO INPATIENT HANDOFF REPORT  ED Nurse Name and Phone #: Marcie Bal [phone]  S Name/Age/Gender Dennis Preston 73 y.o. male Room/Bed: 030C/030C  Code Status   Code Status: Full Code  Home/SNF/Other Home Patient oriented to: self, place, time, and situation Is this baseline? Yes   Triage Complete: Triage complete  Chief Complaint Atrial flutter with rapid ventricular response (HCC) [I48.92]  Triage Note Pt to ED via [facility] , pt took himself to fire station d/t not feeling well and noted to be in AFIB RVR rate 150s and EMS was called.  Pt reports has felt bad since last night.   Hx A.fib. takes blood thinner.  #18 LAC : Cardizem 10MG  given by EMS.after cardizem rate ranges between 60-120bpm.  - Fluid bolus.   Last VS: 173/108, 93%RA    Allergies No Known Allergies  Level of Care/Admitting Diagnosis ED Disposition     ED Disposition  Admit   Condition  --   Comment  Hospital Area: [facility] [[id number]]  Level of Care: Progressive [102]  Admit to Progressive based on following criteria: CARDIOVASCULAR & THORACIC of moderate stability with acute coronary syndrome symptoms/low risk myocardial infarction/hypertensive urgency/arrhythmias/heart failure potentially compromising stability and stable post cardiovascular intervention patients.  May place patient in observation at [facility] or [facility] if equivalent level of care is available:: No  Covid Evaluation: Asymptomatic - no recent exposure (last 10 days) testing not required  Diagnosis: Atrial flutter with rapid ventricular response [facility]) [[id number]]  Admitting Physician: Synetta Fail [[id number]]  Attending Physician: Synetta Fail [[id number]]          B Medical/Surgery History Past Medical History:  Diagnosis Date   Acute diastolic heart failure (HCC) 09/14/2022   Acute metabolic encephalopathy 09/12/2022   Acute pulmonary edema (HCC)    Acute respiratory failure with  hypoxia and hypercarbia (HCC)    AKI (acute kidney injury) (HCC) 08/25/2023   Alcohol abuse    Amaurosis fugax    Aortic atherosclerosis (HCC)    Atrial fibrillation (HCC)    Carotid stenosis    CHF (congestive heart failure) (HCC)    Coagulopathy (HCC)    COPD (chronic obstructive pulmonary disease) (HCC)    Coronary artery disease    Depression    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, SVG-OM1, SVG-PDA, SVG-PL   Hyperlipemia    Hypertension    Hypotension 07/14/2015   Leucocytosis    Liver dysfunction    Long term current use of clopidogrel    Non-ST elevation (NSTEMI) myocardial infarction (HCC) 07/10/2015   NSTEMI (non-ST elevated myocardial infarction) (HCC)    approx 2016   Peripheral artery disease (HCC)    legs   Tobacco abuse    Wears dentures    full upper and lower   Past Surgical History:  Procedure Laterality Date   APPLICATION OF WOUND VAC Right 06/22/2020   Procedure: APPLICATION OF WOUND VAC;  Surgeon: Annice Needy, MD;  Location: [facility] ORS;  Service: Vascular;  Laterality: Right;   CARDIAC CATHETERIZATION N/A 07/10/2015   Procedure: Right/Left Heart Cath and Coronary Angiography;  Surgeon: Alwyn Pea, MD;  Location: [facility] INVASIVE CV LAB;  Service: Cardiovascular;  Laterality: N/A;   CARDIOVERSION N/A 10/14/2022   Procedure: CARDIOVERSION;  Surgeon: Alwyn Pea, MD;  Location: [facility] ORS;  Service: Cardiovascular;  Laterality: N/A;   CORONARY ARTERY BYPASS GRAFT  07/13/2015   [facility].  4 vessel   ENDARTERECTOMY Right 01/02/2021  Procedure: ENDARTERECTOMY CAROTID;  Surgeon: Annice Needy, MD;  Location: [facility] ORS;  Service: Vascular;  Laterality: Right;   ENDARTERECTOMY FEMORAL Bilateral 05/23/2020   Procedure: ENDARTERECTOMY FEMORAL BILATERAL SUPERFICIAL FEMORAL ARTERY STENTS ;  Surgeon: Renford Dills, MD;  Location: [facility] ORS;  Service: Vascular;  Laterality: Bilateral;   FALSE ANEURYSM REPAIR Left 07/22/2020   Procedure: REPAIR LEFT FEMORAL  PSEUDOANUERYSM;  Surgeon: Bertram Denver, MD;  Location: [facility] ORS;  Service: Vascular;  Laterality: Left;   INSERTION OF ILIAC STENT Left 05/23/2020   Procedure: INSERTION OF ILIAC STENT;  Surgeon: Renford Dills, MD;  Location: [facility] ORS;  Service: Vascular;  Laterality: Left;   LEFT HEART CATH AND CORONARY ANGIOGRAPHY N/A 09/16/2022   Procedure: LEFT HEART CATH AND CORONARY ANGIOGRAPHY;  Surgeon: Lennette Bihari, MD;  Location: [facility] INVASIVE CV LAB;  Service: Cardiovascular;  Laterality: N/A;   LEFT HEART CATH AND CORS/GRAFTS ANGIOGRAPHY N/A 01/23/2020   Procedure: LEFT HEART CATH AND CORS/GRAFTS ANGIOGRAPHY;  Surgeon: Alwyn Pea, MD;  Location: [facility] INVASIVE CV LAB;  Service: Cardiovascular;  Laterality: N/A;   LEFT HEART CATH AND CORS/GRAFTS ANGIOGRAPHY N/A 02/18/2022   Procedure: LEFT HEART CATH AND CORS/GRAFTS ANGIOGRAPHY;  Surgeon: Armando Reichert, MD;  Location: [facility] INVASIVE CV LAB;  Service: Cardiovascular;  Laterality: N/A;   LOWER EXTREMITY ANGIOGRAPHY Right 04/03/2020   Procedure: LOWER EXTREMITY ANGIOGRAPHY;  Surgeon: Renford Dills, MD;  Location: [facility] INVASIVE CV LAB;  Service: Cardiovascular;  Laterality: Right;   OLECRANON BURSECTOMY Left 05/06/2019   Procedure: OLECRANON BURSECTOMY AND DEBRIDEMENT;  Surgeon: Signa Kell, MD;  Location: [facility] ORS;  Service: Orthopedics;  Laterality: Left;   WOUND DEBRIDEMENT Right 06/22/2020   Procedure: DEBRIDEMENT WOUND RIGHT GROIN;  Surgeon: Annice Needy, MD;  Location: [facility] ORS;  Service: Vascular;  Laterality: Right;     A IV Location/Drains/Wounds Patient Lines/Drains/Airways Status     Active Line/Drains/Airways     Name Placement date Placement time Site Days   Peripheral IV 12/25/23 18 G Anterior;Distal;Left;Upper Arm 12/25/23  1750  Arm  less than 1            Intake/Output Last 24 hours No intake or output data in the 24 hours ending 12/25/23 1914  Labs/Imaging Results for orders placed or performed during  the hospital encounter of 12/25/23 (from the past 48 hours)  Basic metabolic panel     Status: Abnormal   Collection Time: 12/25/23  5:25 PM  Result Value Ref Range   Sodium 139 135 - 145 mmol/L   Potassium 4.3 3.5 - 5.1 mmol/L   Chloride 101 98 - 111 mmol/L   CO2 28 22 - 32 mmol/L   Glucose, Bld 116 (H) 70 - 99 mg/dL    Comment: Glucose reference range applies only to samples taken after fasting for at least 8 hours.   BUN 25 (H) 8 - 23 mg/dL   Creatinine, Ser 1.47 (H) 0.61 - 1.24 mg/dL   Calcium 8.9 8.9 - 82.9 mg/dL   GFR, Estimated >56 >21 mL/min    Comment: (NOTE) Calculated using the CKD-EPI Creatinine Equation (2021)    Anion gap 10 5 - 15    Comment: Performed at [facility], [address] [place], [place] [address]  CBC     Status: Abnormal   Collection Time: 12/25/23  5:25 PM  Result Value Ref Range   WBC 11.7 (H) 4.0 - 10.5 K/uL   RBC 4.88 4.22 - 5.81 MIL/uL  Hemoglobin 14.7 13.0 - 17.0 g/dL   HCT 16.1 09.6 - 04.5 %   MCV 91.8 80.0 - 100.0 fL   MCH 30.1 26.0 - 34.0 pg   MCHC 32.8 30.0 - 36.0 g/dL   RDW 40.9 81.1 - 91.4 %   Platelets 198 150 - 400 K/uL   nRBC 0.0 0.0 - 0.2 %    Comment: Performed at [facility], [address] [place], [place] [address]  Protime-INR- (order if Patient is taking Coumadin / Warfarin)     Status: Abnormal   Collection Time: 12/25/23  5:25 PM  Result Value Ref Range   Prothrombin Time 24.1 (H) 11.4 - 15.2 seconds   INR 2.1 (H) 0.8 - 1.2    Comment: (NOTE) INR goal varies based on device and disease states. Performed at [facility], [address] [place], [place] [address]    DG Chest Port 1 View Result Date: 12/25/2023 CLINICAL DATA:  Atrial fibrillation. EXAM: PORTABLE CHEST 1 VIEW COMPARISON:  November 23, 2023. FINDINGS: The heart size and mediastinal contours are within normal limits. Status post coronary artery bypass graft. Both lungs are clear. The visualized skeletal structures are unremarkable.  IMPRESSION: No active disease. Electronically Signed   By: Lupita Raider M.D.   On: 12/25/2023 18:12    Pending Labs Unresulted Labs (From admission, onward)     Start     Ordered   12/26/23 0500  Comprehensive metabolic panel  Tomorrow morning,   R        12/25/23 1909   12/26/23 0500  CBC  Tomorrow morning,   R        12/25/23 1909   12/26/23 0500  Protime-INR  Tomorrow morning,   R        12/25/23 1909   12/25/23 1750  Magnesium  Once,   STAT        12/25/23 1750            Vitals/Pain Today's Vitals   12/25/23 1815 12/25/23 1830 12/25/23 1845 12/25/23 1913  BP: 139/68 133/65 121/63   Pulse: 67 67 (!) 57   Resp: 20 (!) 21 (!) 27   Temp:      TempSrc:      SpO2: 100% 98% 100%   Weight:      Height:      PainSc:    0-No pain    Isolation Precautions No active isolations  Medications Medications  diltiazem (CARDIZEM) 125 mg in dextrose 5% 125 mL (1 mg/mL) infusion (5 mg/hr Intravenous New Bag/Given 12/25/23 1805)  diltiazem (CARDIZEM CD) 24 hr capsule 360 mg (has no administration in time range)  furosemide (LASIX) tablet 20 mg (has no administration in time range)  spironolactone (ALDACTONE) tablet 12.5 mg (has no administration in time range)  atorvastatin (LIPITOR) tablet 80 mg (has no administration in time range)  clopidogrel (PLAVIX) tablet 75 mg (has no administration in time range)  diazepam (VALIUM) tablet 5 mg (has no administration in time range)  fluticasone furoate-vilanterol (BREO ELLIPTA) 100-25 MCG/ACT 1 puff (has no administration in time range)    And  umeclidinium bromide (INCRUSE ELLIPTA) 62.5 MCG/ACT 1 puff (has no administration in time range)  levalbuterol (XOPENEX) nebulizer solution 1.25 mg (has no administration in time range)  sodium chloride flush (NS) 0.9 % injection 3 mL (has no administration in time range)  acetaminophen (TYLENOL) tablet 650 mg (has no administration in time range)    Or  acetaminophen (  TYLENOL) suppository 650  mg (has no administration in time range)  polyethylene glycol (MIRALAX / GLYCOLAX) packet 17 g (has no administration in time range)  sodium chloride 0.9 % bolus 500 mL (500 mLs Intravenous New Bag/Given 12/25/23 1755)    Mobility walks     Focused Assessments Cardiac Assessment Handoff:  Cardiac Rhythm: Atrial flutter Lab Results  Component Value Date   CKMB 5.6 (H) 07/09/2015   TROPONINI <0.03 06/13/2018   No results found for: "DDIMER" Does the Patient currently have chest pain? No    R Recommendations: See Admitting Provider Note  Report given to:   Additional Notes:

## 2023-12-26 DIAGNOSIS — Z951 Presence of aortocoronary bypass graft: Secondary | ICD-10-CM | POA: Diagnosis not present

## 2023-12-26 DIAGNOSIS — I5032 Chronic diastolic (congestive) heart failure: Secondary | ICD-10-CM | POA: Diagnosis not present

## 2023-12-26 DIAGNOSIS — I2510 Atherosclerotic heart disease of native coronary artery without angina pectoris: Secondary | ICD-10-CM | POA: Diagnosis not present

## 2023-12-26 DIAGNOSIS — I6529 Occlusion and stenosis of unspecified carotid artery: Secondary | ICD-10-CM | POA: Diagnosis not present

## 2023-12-26 DIAGNOSIS — F411 Generalized anxiety disorder: Secondary | ICD-10-CM | POA: Diagnosis not present

## 2023-12-26 DIAGNOSIS — I4892 Unspecified atrial flutter: Secondary | ICD-10-CM | POA: Diagnosis not present

## 2023-12-26 DIAGNOSIS — J4489 Other specified chronic obstructive pulmonary disease: Secondary | ICD-10-CM | POA: Diagnosis not present

## 2023-12-26 DIAGNOSIS — F419 Anxiety disorder, unspecified: Secondary | ICD-10-CM | POA: Diagnosis not present

## 2023-12-26 DIAGNOSIS — I4891 Unspecified atrial fibrillation: Secondary | ICD-10-CM | POA: Diagnosis not present

## 2023-12-26 DIAGNOSIS — I739 Peripheral vascular disease, unspecified: Secondary | ICD-10-CM | POA: Diagnosis not present

## 2023-12-26 DIAGNOSIS — E785 Hyperlipidemia, unspecified: Secondary | ICD-10-CM | POA: Diagnosis not present

## 2023-12-26 DIAGNOSIS — I110 Hypertensive heart disease with heart failure: Secondary | ICD-10-CM | POA: Diagnosis not present

## 2023-12-26 LAB — COMPREHENSIVE METABOLIC PANEL WITH GFR
ALT: 18 U/L (ref 0–44)
AST: 15 U/L (ref 15–41)
Albumin: 3.2 g/dL — ABNORMAL LOW (ref 3.5–5.0)
Alkaline Phosphatase: 65 U/L (ref 38–126)
Anion gap: 6 (ref 5–15)
BUN: 20 mg/dL (ref 8–23)
CO2: 30 mmol/L (ref 22–32)
Calcium: 8.5 mg/dL — ABNORMAL LOW (ref 8.9–10.3)
Chloride: 104 mmol/L (ref 98–111)
Creatinine, Ser: 1.02 mg/dL (ref 0.61–1.24)
GFR, Estimated: >60 mL/min (ref >60)
Glucose, Bld: 122 mg/dL — ABNORMAL HIGH (ref 70–99)
Potassium: 3.6 mmol/L (ref 3.5–5.1)
Sodium: 140 mmol/L (ref 135–145)
Total Bilirubin: 0.4 mg/dL (ref 0.0–1.2)
Total Protein: 5.2 g/dL — ABNORMAL LOW (ref 6.5–8.1)

## 2023-12-26 LAB — CBC
HCT: 40 % (ref 39.0–52.0)
Hemoglobin: 13.2 g/dL (ref 13.0–17.0)
MCH: 30.1 pg (ref 26.0–34.0)
MCHC: 33 g/dL (ref 30.0–36.0)
MCV: 91.3 fL (ref 80.0–100.0)
Platelets: 165 10*3/uL (ref 150–400)
RBC: 4.38 MIL/uL (ref 4.22–5.81)
RDW: 15.4 % (ref 11.5–15.5)
WBC: 7.2 10*3/uL (ref 4.0–10.5)
nRBC: 0 % (ref 0.0–0.2)

## 2023-12-26 LAB — PROTIME-INR
INR: 2.1 — ABNORMAL HIGH (ref 0.8–1.2)
Prothrombin Time: 24.1 s — ABNORMAL HIGH (ref 11.4–15.2)

## 2023-12-26 MED ORDER — DILTIAZEM HCL 60 MG PO TABS
90.0000 mg | ORAL_TABLET | Freq: Four times a day (QID) | ORAL | Status: DC
  Administered 2023-12-26 (×3): 90 mg via ORAL
  Filled 2023-12-26 (×3): qty 1

## 2023-12-26 MED ORDER — WARFARIN SODIUM 1 MG PO TABS
1.5000 mg | ORAL_TABLET | Freq: Once | ORAL | Status: AC
  Administered 2023-12-26: 1.5 mg via ORAL
  Filled 2023-12-26: qty 1

## 2023-12-26 MED ORDER — DILTIAZEM HCL 60 MG PO TABS
90.0000 mg | ORAL_TABLET | Freq: Four times a day (QID) | ORAL | Status: DC
  Administered 2023-12-26 – 2023-12-28 (×5): 90 mg via ORAL
  Filled 2023-12-26 (×6): qty 1

## 2023-12-26 NOTE — Plan of Care (Signed)
  Problem: Education: Goal: Knowledge of General Education information will improve Description: Including pain rating scale, medication(s)/side effects and non-pharmacologic comfort measures Outcome: Progressing   Problem: Health Behavior/Discharge Planning: Goal: Ability to manage health-related needs will improve Outcome: Progressing   Problem: Clinical Measurements: Goal: Ability to maintain clinical measurements within normal limits will improve Outcome: Progressing Goal: Cardiovascular complication will be avoided Outcome: Progressing   Problem: Activity: Goal: Risk for activity intolerance will decrease Outcome: Progressing   Problem: Coping: Goal: Level of anxiety will decrease Outcome: Progressing   

## 2023-12-26 NOTE — Progress Notes (Signed)
 PHARMACY - ANTICOAGULATION CONSULT NOTE  Pharmacy Consult for warfarin  Indication: atrial fibrillation  No Known Allergies  Patient Measurements: Height: 5\' 5"  (165.1 cm) Weight: 69.8 kg (153 lb 14.1 oz) IBW/kg (Calculated) : 61.5 HEPARIN DW (KG): 69.8  Vital Signs: Temp: 97.6 F (36.4 C) (03/29 0736) Temp Source: Oral (03/29 0736) BP: 124/53 (03/29 0736) Pulse Rate: 67 (03/29 0736)  Labs: Recent Labs    12/25/23 1725 12/26/23 0337  HGB 14.7 13.2  HCT 44.8 40.0  PLT 198 165  LABPROT 24.1* 24.1*  INR 2.1* 2.1*  CREATININE 1.25* 1.02    Estimated Creatinine Clearance: 56.9 mL/min (by C-G formula based on SCr of 1.02 mg/dL).   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 On warfarin PTA, last dose 3/27 PM, patient taking 2mg  Mon-Fri and 1.5mg  on Sat/Sun. HgB 14.7 and PLTs 198. INR today 2.1.   INR 3/29 2.1, therapeutic after PTA 2mg  dose yesterday. Hgb  13.2, Plt 165 stable/WNL No signs/symptoms of bleeding noted Eating 75% of meals No new drug interactions noted  Goal of Therapy:  INR 2-3 Monitor platelets by anticoagulation protocol: Yes   Plan:  INR therapeutic today - will continue with home warfarin dosing > 1.5mg  tonight  Daily INR checks Monitor for signs/symptoms of bleeding Monitor appetite and for new drug interactions Follow up possible transition to DOAC due to reported difficulty maintaining therapeutic INR- Copay Eliquis $47  Stephenie Acres, PharmD PGY1 Pharmacy Resident 12/26/2023 10:27 AM

## 2023-12-26 NOTE — Plan of Care (Signed)

## 2023-12-26 NOTE — Progress Notes (Signed)
   Patient Name: Dennis Preston Date of Encounter: 12/26/2023 [facility] Cardiologist: Juliann Pares (EP- Lalla Brothers)  Interval Summary  .    Remains in atrial flutter but now is very well rate controlled (60s-70s).  Vital Signs .    Vitals:   12/26/23 0429 12/26/23 0529 12/26/23 0736 12/26/23 0750  BP: 106/64 (!) 120/56 (!) 124/53   Pulse:   67   Resp:   18   Temp:   97.6 F (36.4 C)   TempSrc:   Oral   SpO2: 97% 97% 98% 95%  Weight:      Height:        Intake/Output Summary (Last 24 hours) at 12/26/2023 0955 Last data filed at 12/26/2023 0738 Gross per 24 hour  Intake 752.89 ml  Output 650 ml  Net 102.89 ml      12/25/2023    9:00 PM 12/25/2023    5:26 PM 11/26/2023    4:34 AM  Last 3 Weights  Weight (lbs) 153 lb 14.1 oz 157 lb 158 lb 11.2 oz  Weight (kg) 69.8 kg 71.215 kg 71.986 kg      Telemetry/ECG    Flutter with controlled ventricular response- Personally Reviewed  Physical Exam .   GEN: No acute distress.   Neck: No JVD Cardiac: Irregular rhythm , no murmurs, rubs, or gallops.  Respiratory: Clear to auscultation bilaterally. GI: Soft, nontender, non-distended  MS: No edema  Assessment & Plan .     73 y.o. 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 INR has now been 2.0 or higher since 11/26/2023 on [facility] labs, but note INR was as low as 1.5 on 03/05 and 1.9 on 03/17 labs performed at [facility]. He is on warfarin rather than direct oral anticoagulants due to cost constraints. Repeated admissions for a flutter with RVR.  Will try to get him on the schedule for TEE cardioversion on Monday. He is felt to be a poor candidate for antiarrhythmic drugs  due to underlying coronary disease and relatively long QT interval. EP consultation was performed as an inpatient in December 2023 and again in February 2025 by Dr. Nelly Laurence. Note that he was a no-show for outpatient EP consultation with Dr. Lalla Brothers scheduled on 10/15/2022 and 11/05/2022. To reduce likelihood of readmission and possible complications related to anticoagulation, it would be great if we could perform atrial flutter cavotricuspid isthmus ablation during this hospital stay and then send him home with a month of direct oral anticoagulant.  This may not be logistically possible, but we will try.  For questions or updates, please contact [facility] Please consult [email or web address] for contact info under        Signed, Thurmon Fair, MD

## 2023-12-26 NOTE — Progress Notes (Signed)
   12/26/23 2014  Assess: MEWS Score  Temp 97.8 F (36.6 C)  BP (!) 163/71  MAP (mmHg) 95  ECG Heart Rate (!) 118  Resp 18  Level of Consciousness Alert  SpO2 97 %  O2 Device Nasal Cannula  O2 Flow Rate (L/min) 4 L/min  Assess: MEWS Score  MEWS Temp 0  MEWS Systolic 0  MEWS Pulse 2  MEWS RR 0  MEWS LOC 0  MEWS Score 2  MEWS Score Color Yellow  Assess: if the MEWS score is Yellow or Red  Were vital signs accurate and taken at a resting state? Yes  Does the patient meet 2 or more of the SIRS criteria? No  MEWS guidelines implemented  Yes, yellow  Treat  MEWS Interventions Considered administering scheduled or prn medications/treatments as ordered  Take Vital Signs  Increase Vital Sign Frequency  Yellow: Q2hr x1, continue Q4hrs until patient remains green for 12hrs  Escalate  MEWS: Escalate Yellow: Discuss with charge nurse and consider notifying provider and/or RRT  Notify: Charge Nurse/RN  Name of Charge Nurse/RN Notified Noreene Larsson, RN  Provider Notification  Provider Name/Title Dr Janalyn Shy  Date Provider Notified 12/26/23  Time Provider Notified 2014  Method of Notification Page (Secured chat)  Notification Reason Other (Comment) (Pt reports feeling a little short of breath. spo2 96% at 4L [place]. His HR went up to 118 and he feels palpitations. Pt denies chest pain or chest pressure.)  Provider response See new orders (Cardizem dose given early)  Date of Provider Response 12/26/23  Time of Provider Response 2014  Assess: SIRS CRITERIA  SIRS Temperature  0  SIRS Respirations  0  SIRS Pulse 1  SIRS WBC 0  SIRS Score Sum  1

## 2023-12-26 NOTE — Consult Note (Signed)
 Cardiology Consultation   Patient ID: Dennis Preston MRN: [id number]; DOB: [birth year]  Admit date: 12/25/2023 Date of Consult: 12/26/2023  PCP:  Barbette Reichmann, MD   [facility] Providers Cardiologist:  None  Electrophysiologist:  Lanier Prude, MD       Patient Profile / HPI   Dennis Preston is a 73 y.o. 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  Patient was recently admitted from 2/24-2/27 with AFL with RVR. He was treated with diltiazem with improvement and was ultimately discharged on diltiazem 360mg  daily. He was not a candidate for cardioversion due to his subtherapeutic INR and was recommended for outpatient follow-up.  Patient was admitted on 3/28, presenting after feeling unwell after using a nebulizer treatment. He was found to be in AF/AFL with RVR with rates in 150-160s. He was initially given 500cc IVF and IV diltiazem with improvement of HR to 60-120s. In the ER, he was started on a diltiazem drip for rate control and admitted to hospital medicine. As an outpatient, he reports adherence to his medications, however, his INR in his warfarin clinic has recently been subtherapeutic, however, was 2.1 on admission.   Overnight, patient had been continued in diltiazem 5mg /hr gtt, but was found to be in AFL with bradycardia with ventricular rates in 40-50s and the diltiazem gtt was stopped around 0040. On my evaluation at around 0200, patient was in AFL with variable block with rates in 60-90s. Patient exhibited frustration with his AFL, but denied any chest pain or palpitations; he did note some shortness of breath, lightheadedness, and fatigue.     Past Medical History:  Diagnosis Date   Acute diastolic heart failure (HCC) 09/14/2022   Acute metabolic encephalopathy 09/12/2022   Acute pulmonary edema  (HCC)    Acute respiratory failure with hypoxia and hypercarbia (HCC)    AKI (acute kidney injury) (HCC) 08/25/2023   Alcohol abuse    Amaurosis fugax    Aortic atherosclerosis (HCC)    Atrial fibrillation (HCC)    Carotid stenosis    CHF (congestive heart failure) (HCC)    Coagulopathy (HCC)    COPD (chronic obstructive pulmonary disease) (HCC)    Coronary artery disease    Depression    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, SVG-OM1, SVG-PDA, SVG-PL   Hyperlipemia    Hypertension    Hypotension 07/14/2015   Leucocytosis    Liver dysfunction    Long term current use of clopidogrel    Non-ST elevation (NSTEMI) myocardial infarction (HCC) 07/10/2015   NSTEMI (non-ST elevated myocardial infarction) (HCC)    approx 2016   Peripheral artery disease (HCC)    legs   Tobacco abuse    Wears dentures    full upper and lower    Past Surgical History:  Procedure Laterality Date   APPLICATION OF WOUND VAC Right 06/22/2020   Procedure: APPLICATION OF WOUND VAC;  Surgeon: Annice Needy, MD;  Location: [facility] ORS;  Service: Vascular;  Laterality: Right;   CARDIAC CATHETERIZATION N/A 07/10/2015   Procedure: Right/Left Heart Cath and Coronary Angiography;  Surgeon: Alwyn Pea, MD;  Location: [facility] INVASIVE CV LAB;  Service: Cardiovascular;  Laterality: N/A;   CARDIOVERSION N/A 10/14/2022   Procedure: CARDIOVERSION;  Surgeon: Alwyn Pea, MD;  Location: [facility] ORS;  Service: Cardiovascular;  Laterality: N/A;   CORONARY  ARTERY BYPASS GRAFT  07/13/2015   [facility].  4 vessel   ENDARTERECTOMY Right 01/02/2021   Procedure: ENDARTERECTOMY CAROTID;  Surgeon: Annice Needy, MD;  Location: [facility] ORS;  Service: Vascular;  Laterality: Right;   ENDARTERECTOMY FEMORAL Bilateral 05/23/2020   Procedure: ENDARTERECTOMY FEMORAL BILATERAL SUPERFICIAL FEMORAL ARTERY STENTS ;  Surgeon: Renford Dills, MD;  Location: [facility] ORS;  Service: Vascular;  Laterality: Bilateral;   FALSE ANEURYSM REPAIR Left 07/22/2020    Procedure: REPAIR LEFT FEMORAL PSEUDOANUERYSM;  Surgeon: Bertram Denver, MD;  Location: [facility] OR[facility];  Service: Vascular;  Laterality: Left;   INSERTION OF ILIAC STENT Left 05/23/2020   Procedure: INSERTION OF ILIAC STENT;  Surgeon: Renford Dills, MD;  Location: [facility] OR[facility];  Service: Vascular;  Laterality: Left;   LEFT HEART CATH AND CORONARY ANGIOGRAPHY N/A 09/16/2022   Procedure: LEFT HEART CATH AND CORONARY ANGIOGRAPHY;  Surgeon: Lennette Bihari, MD;  Location: [facility] INVASIVE CV LAB;  Service: Cardiovascular;  Laterality: N/A;   LEFT HEART CATH AND CORS/GRAFTS ANGIOGRAPHY N/A 01/23/2020   Procedure: LEFT HEART CATH AND CORS/GRAFTS ANGIOGRAPHY;  Surgeon: Alwyn Pea, MD;  Location: [facility] INVASIVE CV LAB;  Service: Cardiovascular;  Laterality: N/A;   LEFT HEART CATH AND CORS/GRAFTS ANGIOGRAPHY N/A 02/18/2022   Procedure: LEFT HEART CATH AND CORS/GRAFTS ANGIOGRAPHY;  Surgeon: Armando Reichert, MD;  Location: [facility] INVASIVE CV LAB;  Service: Cardiovascular;  Laterality: N/A;   LOWER EXTREMITY ANGIOGRAPHY Right 04/03/2020   Procedure: LOWER EXTREMITY ANGIOGRAPHY;  Surgeon: Renford Dills, MD;  Location: [facility] INVASIVE CV LAB;  Service: Cardiovascular;  Laterality: Right;   OLECRANON BURSECTOMY Left 05/06/2019   Procedure: OLECRANON BURSECTOMY AND DEBRIDEMENT;  Surgeon: Signa Kell, MD;  Location: [facility] OR[facility];  Service: Orthopedics;  Laterality: Left;   WOUND DEBRIDEMENT Right 06/22/2020   Procedure: DEBRIDEMENT WOUND RIGHT GROIN;  Surgeon: Annice Needy, MD;  Location: [facility] OR[facility];  Service: Vascular;  Laterality: Right;     Home Medications:  Prior to Admission medications   Medication Sig Start Date End Date Taking? Authorizing Provider  atorvastatin (LIPITOR) 80 MG tablet Take 1 tablet (80 mg total) by mouth daily at 6 PM. Patient taking differently: Take 80 mg by mouth at bedtime. 07/10/15  Yes Houston Siren, MD  clopidogrel (PLAVIX) 75 MG tablet Take 75 mg by mouth in the morning. 04/18/16  Yes  [provider]  colchicine 0.6 MG tablet Take 0.6 mg by mouth daily as needed (gout flare).   Yes [provider]  diazepam (VALIUM) 5 MG tablet Take 1 tablet (5 mg total) by mouth at bedtime. 08/26/23  Yes Erick Alley, DO  diltiazem (CARDIZEM CD) 360 MG 24 hr capsule Take 1 capsule (360 mg total) by mouth daily. Patient taking differently: Take 360 mg by mouth in the morning. 11/27/23  Yes Rai, Ripudeep K, MD  Fluticasone-Umeclidin-Vilant (TRELEGY ELLIPTA) 100-62.5-25 MCG/INH AEPB Inhale 1 puff into the lungs in the morning. 01/27/20  Yes [provider]  furosemide (LASIX) 40 MG tablet Take 40 mg in the morning and 20 mg (1/2 tablet) in the evening Patient taking differently: Take 60 mg by mouth in the morning. 08/26/23  Yes Erick Alley, DO  OXYGEN Inhale 2 L into the lungs at bedtime. Uses while sleeping   Yes [provider]  spironolactone (ALDACTONE) 25 MG tablet Take 0.5 tablets (12.5 mg total) by mouth daily. Patient taking differently: Take 12.5 mg by mouth in the morning. 11/26/23  Yes Rai, Delene Ruffini, MD  warfarin (COUMADIN) 1 MG tablet Take 1.5 mg by mouth See admin instructions. Take 1.5 tablets (1.5mg ) by mouth at bedtime on Saturday and Sunday 11/10/23  Yes [provider]  warfarin (COUMADIN) 2 MG tablet Take 2 mg by mouth See admin instructions. Take 1 tablet (2mg ) by mouth at bedtime Monday - Friday.   Yes [provider]    Inpatient Medications: Scheduled Meds:  atorvastatin  80 mg Oral q1800   clopidogrel  75 mg Oral QHS   diazepam  5 mg Oral QHS   diltiazem  90 mg Oral Q6H   fluticasone furoate-vilanterol  1 puff Inhalation Daily   And   umeclidinium bromide  1 puff Inhalation Daily   furosemide  40 mg Oral Daily   And   furosemide  20 mg Oral QPM   sodium chloride flush  3 mL Intravenous Q12H   spironolactone  12.5 mg Oral Daily   Warfarin - Pharmacist Dosing Inpatient   Does not apply q1600   Continuous  Infusions:  PRN Meds: acetaminophen **OR** acetaminophen, levalbuterol, polyethylene glycol  Allergies:   No Known Allergies  Social History:   Social History   Socioeconomic History   Marital status: Married    Spouse name: Not on file   Number of children: Not on file   Years of education: Not on file   Highest education level: Not on file  Occupational History   Not on file  Tobacco Use   Smoking status: Former    Current packs/day: 2.50    Average packs/day: 2.5 packs/day for 54.0 years (135.0 ttl pk-yrs)    Types: Cigarettes   Smokeless tobacco: Never   Tobacco comments:    0.5PPD 09/17/21  Vaping Use   Vaping status: Never Used  Substance and Sexual Activity   Alcohol use: Not Currently    Comment: quit over 15 yrs ago   Drug use: Never   Sexual activity: Not on file  Other Topics Concern   Not on file  Social History Narrative   Not on file   Social Drivers of Health   Financial Resource Strain: Low Risk  (04/28/2023)   Received from [facility]   Overall Financial Resource Strain (CARDIA)    Difficulty of Paying Living Expenses: Not hard at all  Food Insecurity: No Food Insecurity (12/25/2023)   Hunger Vital Sign    Worried About Running Out of Food in the Last Year: Never true    Ran Out of Food in the Last Year: Never true  Transportation Needs: No Transportation Needs (12/25/2023)   PRAPARE - Transportation    Lack of Transportation (Medical): No    Lack of Transportation (Non-Medical): No  Physical Activity: Insufficiently Active (02/24/2020)   Received from [facility], [facility]   Exercise Vital Sign    Days of Exercise per Week: 2 days    Minutes of Exercise per Session: 40 min  Stress: No Stress Concern Present (02/24/2020)   Received from [facility], [facility]   [employer] of Occupational Health - Occupational Stress Questionnaire    Feeling of  Stress : Not at all  Social Connections: Moderately Isolated (12/25/2023)   Social Connection and Isolation Panel [NHANES]    Frequency of Communication with Friends and Family: More than three times a week    Frequency of Social Gatherings with Friends and Family: More than three times a week    Attends Religious Services:  Never    Active Member of Clubs or Organizations: No    Attends Banker Meetings: Never    Marital Status: Married  Catering manager Violence: Not At Risk (12/25/2023)   Humiliation, Afraid, Rape, and Kick questionnaire    Fear of Current or Ex-Partner: No    Emotionally Abused: No    Physically Abused: No    Sexually Abused: No    Family History:   Family History  Problem Relation Age of Onset   Depression Sister      ROS:  Please see the history of present illness.   All other ROS reviewed and negative.     Physical Exam/Data:   Vitals:   12/25/23 2023 12/25/23 2100 12/26/23 0029 12/26/23 0129  BP: 122/67  106/64 110/60  Pulse: 70 61    Resp: 20     Temp: 97.6 F (36.4 C)     TempSrc: Oral     SpO2: 99% 97% 96% 97%  Weight:  69.8 kg    Height:  5\' 5"  (1.651 m)      Intake/Output Summary (Last 24 hours) at 12/26/2023 0336 Last data filed at 12/25/2023 2137 Gross per 24 hour  Intake --  Output 200 ml  Net -200 ml      12/25/2023    9:00 PM 12/25/2023    5:26 PM 11/26/2023    4:34 AM  Last 3 Weights  Weight (lbs) 153 lb 14.1 oz 157 lb 158 lb 11.2 oz  Weight (kg) 69.8 kg 71.215 kg 71.986 kg     Body mass index is 25.61 kg/m.  General:  Well nourished, well developed, in no acute distress HEENT: normal Neck: no JVD Vascular: No carotid bruits; Distal pulses 2+ bilaterally Cardiac:  regular rhythm, regular rate, S1, S2; RRR; no murmur  Lungs:  clear to auscultation bilaterally, no wheezing, rhonchi or rales  Abd: soft, nontender, no hepatomegaly  Ext: no edema Musculoskeletal:  No deformities, BUE and BLE strength normal and  equal Skin: warm and dry  Neuro: no focal abnormalities noted Psych:  Normal affect   EKG:  The EKG was personally reviewed and demonstrates:  AFL with variable block with diffuse TWI  Relevant CV Studies: TTE 08/25/23  1. Left ventricular ejection fraction, by estimation, is 55 to 60%. The left ventricle has normal function. The left ventricle has no regional  wall motion abnormalities. There is mild concentric left ventricular  hypertrophy. Left ventricular diastolic  function could not be evaluated.   2. Right ventricular systolic function is mildly reduced. The right ventricular size is normal.   3. The mitral valve is normal in structure. Trivial mitral valve regurgitation. No evidence of mitral stenosis.   4. The aortic valve is tricuspid. There is mild calcification of the aortic valve. Aortic valve regurgitation is not visualized. Aortic valve sclerosis/calcification is present, without any evidence of aortic  stenosis.   5. The inferior vena cava is normal in size with greater than 50% respiratory variability, suggesting right atrial pressure of 3 mmHg.    Laboratory Data:  High Sensitivity Troponin:  No results for input(s): "TROPONINIHS" in the last 720 hours.   Chemistry Recent Labs  Lab 12/25/23 1725 12/25/23 2031  NA 139  --   K 4.3  --   CL 101  --   CO2 28  --   GLUCOSE 116*  --   BUN 25*  --   CREATININE 1.25*  --   CALCIUM 8.9  --  MG  --  2.0  GFRNONAA >60  --   ANIONGAP 10  --     No results for input(s): "PROT", "ALBUMIN", "AST", "ALT", "ALKPHOS", "BILITOT" in the last 168 hours. Lipids No results for input(s): "CHOL", "TRIG", "HDL", "LABVLDL", "LDLCALC", "CHOLHDL" in the last 168 hours.  Hematology Recent Labs  Lab 12/25/23 1725  WBC 11.7*  RBC 4.88  HGB 14.7  HCT 44.8  MCV 91.8  MCH 30.1  MCHC 32.8  RDW 15.3  PLT 198   Thyroid No results for input(s): "TSH", "FREET4" in the last 168 hours.  BNPNo results for input(s): "BNP", "PROBNP" in  the last 168 hours.  DDimer No results for input(s): "DDIMER" in the last 168 hours.   Radiology/Studies:  DG Chest Port 1 View Result Date: 12/25/2023 CLINICAL DATA:  Atrial fibrillation. EXAM: PORTABLE CHEST 1 VIEW COMPARISON:  November 23, 2023. FINDINGS: The heart size and mediastinal contours are within normal limits. Status post coronary artery bypass graft. Both lungs are clear. The visualized skeletal structures are unremarkable. IMPRESSION: No active disease. Electronically Signed   By: Lupita Raider M.D.   On: 12/25/2023 18:12     Assessment and Plan:   #AFL with variable block Patient with recent admission on 2/24-2/27 for AFL with RVR, treated with diltiazem; not cardioverted due to subtherapeutic INR, presenting again with AFL with RVR. He was started on diltiazem IV with subsequent variable block with ventricular rates in 40-50s; will transition to PO diltiazem and continue to monitor. Recent TTE on 08/25/23 demonstrated LVEF 55-60% without WMAs, mild concentric LVH, LAVI 20.2, no significant valvular disease. Given recent subtherapeutic INR and failing rate controlling strategies as an outpatient, may consider TEE cardioversion. --continue warfarin, goal INR 2-3  --review alternate anticoagulation options given CHADSVASC 6 and difficulty with maintaining outpatient therapeutic INR --stop diltiazem gtt --start PO diltiazem 90mg  q6h  --may consider TEE/cardioversion  Risk Assessment/Risk Scores:   CHA2DS2-VASc Score = 6   This indicates a 9.7% annual risk of stroke. The patient's score is based upon: CHF History: 1 HTN History: 1 Diabetes History: 0 Stroke History: 2 Vascular Disease History: 1 Age Score: 1 Gender Score: 0    For questions or updates, please contact [facility] Please consult [email or web address] for contact info under    Signed, Rubye Oaks, MD  12/26/2023 3:36 AM

## 2023-12-26 NOTE — Progress Notes (Signed)
 [facility]                                                                              [name], is a 73 y.o. male, DOB - Feb 26, 1951, WJX:[id number] Admit date - 12/25/2023    Outpatient Primary MD for the patient is Hande, Roderic Palau, MD  LOS - 0  days  Chief Complaint  Patient presents with   Atrial Fibrillation       Brief summary   Patient is a 73 year old male with hypertension, hyperlipidemia, atrial fibrillation, PAD status post bifemoral bypass, CAD status post CABG, carotid disease status post endarterectomy, chronic diastolic CHF, COPD, anxiety, depression presented with A-fib with RVR. Patient was admitted 2/24-2/27 with a flutter with RVR, improved with diltiazem.  Patient was seen by cardiology but unable to cardiovert due to subtherapeutic INR and recommended outpatient follow-up. Day before admission, started feeling unwell after using the nebulizer treatment.  He went to the fire station and noted A-fib with RVR with heart rate 150s to 160s.  EMS was called and the patient received 500 cc IV fluids and IV diltiazem.  In ED, he was placed on IV diltiazem drip but continued to be in a flutter. Cardiology consulted and patient was admitted.   Assessment & Plan    Principal Problem:   Atrial flutter with rapid ventricular response (HCC), variable block -Recent admission 2/24 to 2/27 for a flutter with RVR, treated with diltiazem, not cardioverted due to subtherapeutic INR now presented again with the same -Initially started on IV Cardizem drip, cardiology consulted now transitioned to oral Cardizem 90 mg every 6 hours -Continued on warfarin, INR therapeutic, 2.1 (per patient, he has not been able to afford DOAC once his insurance was switched, was high co-pay), will have pharmacy reassess cost -Cardiology following, may consider TEE/cardioversion    Hypertension -BP stable, continue diltiazem, Lasix, Aldactone    Hyperlipidemia - Continue  atorvastatin   PAD -Status post aortobifemoral bypass -Currently on Plavix, statin   CAD status post CABG -Continue Plavix, atorvastatin, -Cardiology following, warfarin as above    Carotid artery disease -Status post endarterectomy - Continue Plavix, atorvastatin - Warfarin as above   Chronic diastolic CHF - Last echo was in November 2024 with EF 55-60%, indeterminate diastolic function, mildly reduced RV function. -Currently stable, euvolemic   COPD  -Appears to his have history of oxygen use at night as well. - Replace home Trelegy with formulary Breo and Incruse - As needed Xopenex   Anxiety Depression - Continue home diazepam  Estimated body mass index is 25.61 kg/m as calculated from the following:   Height as of this encounter: 5\' 5"  (1.651 m).   Weight as of this encounter: 69.8 kg.  Code Status: Full code DVT Prophylaxis:  Warfarin   Level of Care: Level of care: Progressive Family Communication: Updated patient Disposition Plan:      Remains inpatient appropriate:      Procedures:    Consultants:   Cardiology  Antimicrobials:   Anti-infectives (From admission, onward)    None          Medications  atorvastatin  80 mg  Oral q1800   clopidogrel  75 mg Oral QHS   diazepam  5 mg Oral QHS   diltiazem  90 mg Oral Q6H   fluticasone furoate-vilanterol  1 puff Inhalation Daily   And   umeclidinium bromide  1 puff Inhalation Daily   furosemide  40 mg Oral Daily   And   furosemide  20 mg Oral QPM   sodium chloride flush  3 mL Intravenous Q12H   spironolactone  12.5 mg Oral Daily   Warfarin - Pharmacist Dosing Inpatient   Does not apply q1600      Subjective:   Dennis Preston was seen and examined today.  Still in flutter but rate controlled.  No chest pain, shortness of breath or dizziness.  Denies any abdominal pain, N/V/D/C, new weakness, numbess, tingling.   Objective:   Vitals:   12/26/23 0429 12/26/23 0529 12/26/23 0736 12/26/23  0750  BP: 106/64 (!) 120/56 (!) 124/53   Pulse:   67   Resp:   18   Temp:   97.6 F (36.4 C)   TempSrc:   Oral   SpO2: 97% 97% 98% 95%  Weight:      Height:        Intake/Output Summary (Last 24 hours) at 12/26/2023 0924 Last data filed at 12/26/2023 0738 Gross per 24 hour  Intake 752.89 ml  Output 650 ml  Net 102.89 ml     Wt Readings from Last 3 Encounters:  12/25/23 69.8 kg  11/26/23 72 kg  08/26/23 73.4 kg     Exam General: Alert and oriented x 3, NAD Cardiovascular: Irregular Respiratory: Clear to auscultation bilaterally, no wheezing Gastrointestinal: Soft, nontender, nondistended, + bowel sounds Ext: no pedal edema bilaterally Neuro: no new deficits Psych: Normal affect     Data Reviewed:  I have personally reviewed following labs    CBC Lab Results  Component Value Date   WBC 7.2 12/26/2023   RBC 4.38 12/26/2023   HGB 13.2 12/26/2023   HCT 40.0 12/26/2023   MCV 91.3 12/26/2023   MCH 30.1 12/26/2023   PLT 165 12/26/2023   MCHC 33.0 12/26/2023   RDW 15.4 12/26/2023   LYMPHSABS 0.7 08/24/2023   MONOABS 1.0 08/24/2023   EOSABS 0.1 08/24/2023   BASOSABS 0.0 08/24/2023     Last metabolic panel Lab Results  Component Value Date   NA 140 12/26/2023   K 3.6 12/26/2023   CL 104 12/26/2023   CO2 30 12/26/2023   BUN 20 12/26/2023   CREATININE 1.02 12/26/2023   GLUCOSE 122 (H) 12/26/2023   GFRNONAA >60 12/26/2023   GFRAA >60 06/22/2020   CALCIUM 8.5 (L) 12/26/2023   PHOS 3.3 09/14/2022   PROT 5.2 (L) 12/26/2023   ALBUMIN 3.2 (L) 12/26/2023   BILITOT 0.4 12/26/2023   ALKPHOS 65 12/26/2023   AST 15 12/26/2023   ALT 18 12/26/2023   ANIONGAP 6 12/26/2023    CBG (last 3)  No results for input(s): "GLUCAP" in the last 72 hours.    Coagulation Profile: Recent Labs  Lab 12/25/23 1725 12/26/23 0337  INR 2.1* 2.1*     Radiology Studies: I have personally reviewed the imaging studies  DG Chest Port 1 View Result Date:  12/25/2023 CLINICAL DATA:  Atrial fibrillation. EXAM: PORTABLE CHEST 1 VIEW COMPARISON:  November 23, 2023. FINDINGS: The heart size and mediastinal contours are within normal limits. Status post coronary artery bypass graft. Both lungs are clear. The visualized skeletal structures are unremarkable. IMPRESSION: No active  disease. Electronically Signed   By: Lupita Raider M.D.   On: 12/25/2023 18:12       Harleyquinn Gasser M.D. [facility] 12/26/2023, 9:24 AM  Available via Epic secure chat 7am-7pm After 7 pm, please refer to night coverage provider listed on amion.

## 2023-12-26 NOTE — Progress Notes (Addendum)
 History of paroxysmal atrial fibrillation/flutter A-fib RVR Atrial flutter with variable AV block Received a page from patient RN that heart rate dropped to 50.  EKG has been obtained multiple times which showing persistent atrial flutter with variable AV block and heart rate in between 61-67.    - Patient being admitted for A-fib RVR known history of atrial fibrillation/flutter on warfarin and Cardizem 360 mg at home.  -Currently on Cardizem drip since 5 PM however due to bradycardia I have requested to nurse to hold the drip since 1 AM. - Consulting cardiology Dr. Shirline Frees for further recommendation.  Update, - Cardiology recommending holding long-acting diltiazem and start short acting diltiazem 90 mg every 6 hour as of now.  And patient might be candidate for TEE cardioversion on Monday.  Cardiology will formally review for further recommendation. -Discontinuing the Cardizem drip which has been on hold since 1 AM. -Starting Cardizem 90 mg every 6 hour. - Continue cardiac monitoring.   Tereasa Coop, MD [facility] 12/26/2023, 2:39 AM

## 2023-12-26 NOTE — Care Management Obs Status (Signed)
 MEDICARE OBSERVATION STATUS NOTIFICATION   Patient Details  Name: Dennis Preston MRN: [id number] Date of Birth: [birth year]   Medicare Observation Status Notification Given:  Yes    Ronny Bacon, RN 12/26/2023, 11:54 AM

## 2023-12-27 DIAGNOSIS — F411 Generalized anxiety disorder: Secondary | ICD-10-CM | POA: Diagnosis not present

## 2023-12-27 DIAGNOSIS — J4489 Other specified chronic obstructive pulmonary disease: Secondary | ICD-10-CM | POA: Diagnosis not present

## 2023-12-27 DIAGNOSIS — I5032 Chronic diastolic (congestive) heart failure: Secondary | ICD-10-CM | POA: Diagnosis not present

## 2023-12-27 DIAGNOSIS — I4892 Unspecified atrial flutter: Secondary | ICD-10-CM | POA: Diagnosis not present

## 2023-12-27 LAB — CBC
HCT: 40.1 % (ref 39.0–52.0)
Hemoglobin: 13.2 g/dL (ref 13.0–17.0)
MCH: 30.1 pg (ref 26.0–34.0)
MCHC: 32.9 g/dL (ref 30.0–36.0)
MCV: 91.3 fL (ref 80.0–100.0)
Platelets: 167 10*3/uL (ref 150–400)
RBC: 4.39 MIL/uL (ref 4.22–5.81)
RDW: 15.8 % — ABNORMAL HIGH (ref 11.5–15.5)
WBC: 7.8 10*3/uL (ref 4.0–10.5)
nRBC: 0 % (ref 0.0–0.2)

## 2023-12-27 LAB — RENAL FUNCTION PANEL
Albumin: 3.2 g/dL — ABNORMAL LOW (ref 3.5–5.0)
Anion gap: 7 (ref 5–15)
BUN: 20 mg/dL (ref 8–23)
CO2: 31 mmol/L (ref 22–32)
Calcium: 8.6 mg/dL — ABNORMAL LOW (ref 8.9–10.3)
Chloride: 102 mmol/L (ref 98–111)
Creatinine, Ser: 1.04 mg/dL (ref 0.61–1.24)
GFR, Estimated: >60 mL/min (ref >60)
Glucose, Bld: 113 mg/dL — ABNORMAL HIGH (ref 70–99)
Phosphorus: 2.9 mg/dL (ref 2.5–4.6)
Potassium: 3.8 mmol/L (ref 3.5–5.1)
Sodium: 140 mmol/L (ref 135–145)

## 2023-12-27 LAB — PROTIME-INR
INR: 2.2 — ABNORMAL HIGH (ref 0.8–1.2)
Prothrombin Time: 24.9 s — ABNORMAL HIGH (ref 11.4–15.2)

## 2023-12-27 MED ORDER — SODIUM CHLORIDE 0.9% FLUSH
3.0000 mL | Freq: Two times a day (BID) | INTRAVENOUS | Status: DC
  Administered 2023-12-27: 3 mL via INTRAVENOUS

## 2023-12-27 MED ORDER — WARFARIN SODIUM 1 MG PO TABS
1.5000 mg | ORAL_TABLET | Freq: Once | ORAL | Status: AC
  Administered 2023-12-27: 1.5 mg via ORAL
  Filled 2023-12-27: qty 1

## 2023-12-27 MED ORDER — SODIUM CHLORIDE 0.9% FLUSH: 3.0000 mL | INTRAVENOUS | Status: DC | PRN

## 2023-12-27 NOTE — Progress Notes (Signed)
 RN reported that while patient was asleep heart rate dropped to 40s.  Transient asymptomatic bradycardia-resolved I have reviewed the patient's EKG which is showing atrial flutter, variable AV block and heart rate 67. .It is seems like that patient heart rate dropped to 40s during sleep which is not abnormal however given patient is also on Cardizem will keep an eye on the heart rate. If it remains persistently low below 40s range, patient becomes hypotensive and developed altered mentation's in that case will hold the Cardizem and may be need to give atropine.   Tereasa Coop, MD [facility] 12/27/2023, 12:10 AM

## 2023-12-27 NOTE — H&P (View-Only) (Signed)
   Patient Name: Dennis Preston Date of Encounter: 12/27/2023 [facility] Cardiologist: None   Interval Summary  .    No CV complaints today. Mild bradycardia and diastolic hypotension at night, asymptomatic. Remains in atrial flutter, mostly 4:1 AV block with ventricular rate 68 bpm.  Vital Signs .    Vitals:   12/26/23 2140 12/26/23 2328 12/27/23 0321 12/27/23 0742  BP: (!) 117/50 (!) 123/53 (!) 118/53 (!) 122/48  Pulse:  60 65 66  Resp: 16 18 20 19   Temp: 97.7 F (36.5 C) 98 F (36.7 C) 98.3 F (36.8 C) 97.9 F (36.6 C)  TempSrc: Oral Oral Oral Oral  SpO2: 95% 95% 96%   Weight:      Height:        Intake/Output Summary (Last 24 hours) at 12/27/2023 0924 Last data filed at 12/27/2023 1610 Gross per 24 hour  Intake 237 ml  Output 2125 ml  Net -1888 ml      12/25/2023    9:00 PM 12/25/2023    5:26 PM 11/26/2023    4:34 AM  Last 3 Weights  Weight (lbs) 153 lb 14.1 oz 157 lb 158 lb 11.2 oz  Weight (kg) 69.8 kg 71.215 kg 71.986 kg      Telemetry/ECG    Atrial flutter, rate controlled - Personally Reviewed  Physical Exam .   GEN: No acute distress.   Neck: No JVD Cardiac: RRR, no murmurs, rubs, or gallops.  Respiratory: Clear to auscultation bilaterally. GI: Soft, nontender, non-distended  MS: No edema  Assessment & Plan .     73 y.o. 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 INR has now been 2.0 or higher since 11/26/2023 on [facility] labs, but note INR was as low as 1.5 on 03/05 and 1.9 on 03/17 labs performed at [facility]. He is not on DOAC due to cost. Not a good antiarrhythmic candidate deu to borderline QT and CAD. Plan for TEE-DCCV, hopefully  tomorrow, if schedule allows. Ultimately, he should undergo cavotricuspid isthmus ablation to stop this cycle of readmission. Have reached out to EP to see how soon this can be accomplished. The schedule for EP procedures in the first part of next week is currently full.  Informed Consent   Shared Decision Making/Informed Consent   The risks [stroke, cardiac arrhythmias rarely resulting in the need for a temporary or permanent pacemaker, skin irritation or burns, esophageal damage, perforation (1:10,000 risk), bleeding, pharyngeal hematoma as well as other potential complications associated with conscious sedation including aspiration, arrhythmia, respiratory failure and death], benefits (treatment guidance, restoration of normal sinus rhythm, diagnostic support) and alternatives of a transesophageal echocardiogram guided cardioversion were discussed in detail with Dennis Preston and he is willing to proceed.      For questions or updates, please contact [facility] Please consult [email or web address] for contact info under        Signed, Thurmon Fair, MD

## 2023-12-27 NOTE — Plan of Care (Signed)
  Problem: Clinical Measurements: Goal: Will remain free from infection Outcome: Progressing Goal: Respiratory complications will improve Outcome: Progressing Goal: Cardiovascular complication will be avoided Outcome: Progressing   Problem: Nutrition: Goal: Adequate nutrition will be maintained Outcome: Progressing   Problem: Coping: Goal: Level of anxiety will decrease Outcome: Progressing   Problem: Safety: Goal: Ability to remain free from injury will improve Outcome: Progressing   Problem: Education: Goal: Knowledge of disease or condition will improve Outcome: Progressing Goal: Understanding of medication regimen will improve Outcome: Progressing   Problem: Cardiac: Goal: Ability to achieve and maintain adequate cardiopulmonary perfusion will improve Outcome: Progressing

## 2023-12-27 NOTE — Progress Notes (Addendum)
   Patient Name: Dennis Preston Date of Encounter: 12/27/2023 [facility] Cardiologist: None   Interval Summary  .    No CV complaints today. Mild bradycardia and diastolic hypotension at night, asymptomatic. Remains in atrial flutter, mostly 4:1 AV block with ventricular rate 68 bpm.  Vital Signs .    Vitals:   12/26/23 2140 12/26/23 2328 12/27/23 0321 12/27/23 0742  BP: (!) 117/50 (!) 123/53 (!) 118/53 (!) 122/48  Pulse:  60 65 66  Resp: 16 18 20 19   Temp: 97.7 F (36.5 C) 98 F (36.7 C) 98.3 F (36.8 C) 97.9 F (36.6 C)  TempSrc: Oral Oral Oral Oral  SpO2: 95% 95% 96%   Weight:      Height:        Intake/Output Summary (Last 24 hours) at 12/27/2023 0924 Last data filed at 12/27/2023 1610 Gross per 24 hour  Intake 237 ml  Output 2125 ml  Net -1888 ml      12/25/2023    9:00 PM 12/25/2023    5:26 PM 11/26/2023    4:34 AM  Last 3 Weights  Weight (lbs) 153 lb 14.1 oz 157 lb 158 lb 11.2 oz  Weight (kg) 69.8 kg 71.215 kg 71.986 kg      Telemetry/ECG    Atrial flutter, rate controlled - Personally Reviewed  Physical Exam .   GEN: No acute distress.   Neck: No JVD Cardiac: RRR, no murmurs, rubs, or gallops.  Respiratory: Clear to auscultation bilaterally. GI: Soft, nontender, non-distended  MS: No edema  Assessment & Plan .     73 y.o. 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 INR has now been 2.0 or higher since 11/26/2023 on [facility] labs, but note INR was as low as 1.5 on 03/05 and 1.9 on 03/17 labs performed at [facility]. He is not on DOAC due to cost. Not a good antiarrhythmic candidate deu to borderline QT and CAD. Plan for TEE-DCCV, hopefully  tomorrow, if schedule allows. Ultimately, he should undergo cavotricuspid isthmus ablation to stop this cycle of readmission. Have reached out to EP to see how soon this can be accomplished. The schedule for EP procedures in the first part of next week is currently full.  Informed Consent   Shared Decision Making/Informed Consent   The risks [stroke, cardiac arrhythmias rarely resulting in the need for a temporary or permanent pacemaker, skin irritation or burns, esophageal damage, perforation (1:10,000 risk), bleeding, pharyngeal hematoma as well as other potential complications associated with conscious sedation including aspiration, arrhythmia, respiratory failure and death], benefits (treatment guidance, restoration of normal sinus rhythm, diagnostic support) and alternatives of a transesophageal echocardiogram guided cardioversion were discussed in detail with [name] and he is willing to proceed.      For questions or updates, please contact [facility] Please consult [email or web address] for contact info under        Signed, Thurmon Fair, MD

## 2023-12-27 NOTE — Progress Notes (Signed)
 PHARMACY - ANTICOAGULATION CONSULT NOTE  Pharmacy Consult for warfarin  Indication: atrial fibrillation  No Known Allergies  Patient Measurements: Height: 5\' 5"  (165.1 cm) Weight: 69.8 kg (153 lb 14.1 oz) IBW/kg (Calculated) : 61.5 HEPARIN DW (KG): 69.8  Vital Signs: Temp: 97.9 F (36.6 C) (03/30 0742) Temp Source: Oral (03/30 0742) BP: 122/48 (03/30 0742) Pulse Rate: 66 (03/30 0742)  Labs: Recent Labs    12/25/23 1725 12/26/23 0337 12/27/23 0437  HGB 14.7 13.2 13.2  HCT 44.8 40.0 40.1  PLT 198 165 167  LABPROT 24.1* 24.1*  --   INR 2.1* 2.1*  --   CREATININE 1.25* 1.02 1.04    Estimated Creatinine Clearance: 55.8 mL/min (by C-G formula based on SCr of 1.04 mg/dL).   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 On warfarin PTA, last dose 3/27 PM, patient taking 2mg  Mon-Fri and 1.5mg  on Sat/Sun. HgB 14.7 and PLTs 198. INR today 2.1.   3/30 INR 2.2,  therapeutic after PTA 1.5mg  dose yesterday. Hgb 13.2, Plt 167 stable/WNL No signs/symptoms of bleeding noted Eating 75-80% of meals No new drug interactions noted  Goal of Therapy:  INR 2-3 Monitor platelets by anticoagulation protocol: Yes   Plan:  INR therapeutic today - will continue with home warfarin dosing > 1.5mg  tonight  Daily INR checks Monitor for signs/symptoms of bleeding Monitor appetite and for new drug interactions Follow up possible transition to DOAC due to reported difficulty maintaining therapeutic INR- Copay Eliquis $47  Dennis Preston, PharmD PGY1 Pharmacy Resident 12/27/2023 10:49 AM

## 2023-12-27 NOTE — Progress Notes (Signed)
 Paroxysmal fibrillation/flutter and bradycardia Received a call from patient RN that heart rate in between 50 to 60s range and blood pressure is 112/47. Did overnight patient had episode of bradycardia heart rate dropped to 40s after giving the last dose of Cardizem 90 mg around 11 PM. -Holding morning Cardizem in the setting of bradycardia and borderline hypotension. -Need to speak with cardiology in the daytime regarding the adjustment of Cardizem dosing.  Tereasa Coop, MD [facility] 12/27/2023, 6:31 AM

## 2023-12-27 NOTE — Progress Notes (Signed)
 [facility]                                                                              Dennis Preston, is a 73 y.o. male, DOB - [birth year], UEA:[id number] Admit date - 12/25/2023    Outpatient Primary MD for the patient is Hande, Roderic Palau, MD  LOS - 0  days  Chief Complaint  Patient presents with   Atrial Fibrillation       Brief summary   Patient is a 73 year old male with hypertension, hyperlipidemia, atrial fibrillation, PAD status post bifemoral bypass, CAD status post CABG, carotid disease status post endarterectomy, chronic diastolic CHF, COPD, anxiety, depression presented with A-fib with RVR. Patient was admitted 2/24-2/27 with a flutter with RVR, improved with diltiazem.  Patient was seen by cardiology but unable to cardiovert due to subtherapeutic INR and recommended outpatient follow-up. Day before admission, started feeling unwell after using the nebulizer treatment.  He went to the fire station and noted A-fib with RVR with heart rate 150s to 160s.  EMS was called and the patient received 500 cc IV fluids and IV diltiazem.  In ED, he was placed on IV diltiazem drip but continued to be in a flutter. Cardiology consulted and patient was admitted.   Assessment & Plan    Principal Problem:   Atrial flutter with rapid ventricular response (HCC), variable block -Recent admission 2/24 to 2/27 for a flutter with RVR, treated with diltiazem, not cardioverted due to subtherapeutic INR now presented again with the same -Initially started on IV Cardizem drip, cardiology consulted now transitioned to oral Cardizem 90 mg every 6 hours -Continued on warfarin, INR therapeutic, 2.1 (per patient, he has not been able to afford DOAC once his insurance was switched, was high co-pay), will have pharmacy reassess cost -Cardiology following, planning for TEE/DCCV tomorrow if schedule allows N.p.o. after midnight    Hypertension -BP stable, continue diltiazem, Lasix,  Aldactone    Hyperlipidemia - Continue atorvastatin   PAD -Status post aortobifemoral bypass -Currently on Plavix, statin   CAD status post CABG -Continue Plavix, atorvastatin, -Cardiology following, warfarin as above    Carotid artery disease -Status post endarterectomy - Continue Plavix, atorvastatin - Warfarin as above   Chronic diastolic CHF - Last echo was in November 2024 with EF 55-60%, indeterminate diastolic function, mildly reduced RV function. -Currently stable, euvolemic   COPD  -Appears to his have history of oxygen use at night as well. - Replace home Trelegy with formulary Breo and Incruse - As needed Xopenex   Anxiety Depression - Continue home diazepam  Estimated body mass index is 25.61 kg/m as calculated from the following:   Height as of this encounter: 5\' 5"  (1.651 m).   Weight as of this encounter: 69.8 kg.  Code Status: Full code DVT Prophylaxis:  Warfarin warfarin (COUMADIN) tablet 1.5 mg   Level of Care: Level of care: Progressive Family Communication: Updated patient Disposition Plan:      Remains inpatient appropriate:      Procedures:    Consultants:   Cardiology  Antimicrobials:   Anti-infectives (From admission, onward)    None  Medications  atorvastatin  80 mg Oral q1800   clopidogrel  75 mg Oral QHS   diazepam  5 mg Oral QHS   diltiazem  90 mg Oral Q6H   fluticasone furoate-vilanterol  1 puff Inhalation Daily   And   umeclidinium bromide  1 puff Inhalation Daily   furosemide  40 mg Oral Daily   And   furosemide  20 mg Oral QPM   sodium chloride flush  3 mL Intravenous Q12H   sodium chloride flush  3-10 mL Intravenous Q12H   spironolactone  12.5 mg Oral Daily   warfarin  1.5 mg Oral ONCE-1600   Warfarin - Pharmacist Dosing Inpatient   Does not apply q1600      Subjective:   Dennis Preston was seen and examined today.  No acute complaints today.  Still in atrial flutter.  No chest pain,  shortness of breath or dizziness.  Denies any abdominal pain, N/V.  Objective:   Vitals:   12/26/23 2328 12/27/23 0321 12/27/23 0742 12/27/23 1245  BP: (!) 123/53 (!) 118/53 (!) 122/48 (!) 158/57  Pulse: 60 65 66 95  Resp: 18 20 19 20   Temp: 98 F (36.7 C) 98.3 F (36.8 C) 97.9 F (36.6 C) 98.2 F (36.8 C)  TempSrc: Oral Oral Oral Oral  SpO2: 95% 96%  94%  Weight:      Height:        Intake/Output Summary (Last 24 hours) at 12/27/2023 1442 Last data filed at 12/27/2023 1248 Gross per 24 hour  Intake 237 ml  Output 2725 ml  Net -2488 ml     Wt Readings from Last 3 Encounters:  12/25/23 69.8 kg  11/26/23 72 kg  08/26/23 73.4 kg   Physical Exam General: Alert and oriented x 3, NAD Cardiovascular: S1 S2 clear, RRR.  Respiratory: CTAB, no wheezing Gastrointestinal: Soft, nontender, nondistended, NBS Ext: no pedal edema bilaterally Neuro: no new deficits Psych: Normal affect     Data Reviewed:  I have personally reviewed following labs    CBC Lab Results  Component Value Date   WBC 7.8 12/27/2023   RBC 4.39 12/27/2023   HGB 13.2 12/27/2023   HCT 40.1 12/27/2023   MCV 91.3 12/27/2023   MCH 30.1 12/27/2023   PLT 167 12/27/2023   MCHC 32.9 12/27/2023   RDW 15.8 (H) 12/27/2023   LYMPHSABS 0.7 08/24/2023   MONOABS 1.0 08/24/2023   EOSABS 0.1 08/24/2023   BASOSABS 0.0 08/24/2023     Last metabolic panel Lab Results  Component Value Date   NA 140 12/27/2023   K 3.8 12/27/2023   CL 102 12/27/2023   CO2 31 12/27/2023   BUN 20 12/27/2023   CREATININE 1.04 12/27/2023   GLUCOSE 113 (H) 12/27/2023   GFRNONAA >60 12/27/2023   GFRAA >60 06/22/2020   CALCIUM 8.6 (L) 12/27/2023   PHOS 2.9 12/27/2023   PROT 5.2 (L) 12/26/2023   ALBUMIN 3.2 (L) 12/27/2023   BILITOT 0.4 12/26/2023   ALKPHOS 65 12/26/2023   AST 15 12/26/2023   ALT 18 12/26/2023   ANIONGAP 7 12/27/2023    CBG (last 3)  No results for input(s): "GLUCAP" in the last 72 hours.     Coagulation Profile: Recent Labs  Lab 12/25/23 1725 12/26/23 0337 12/27/23 1230  INR 2.1* 2.1* 2.2*     Radiology Studies: I have personally reviewed the imaging studies  DG Chest Port 1 View Result Date: 12/25/2023 CLINICAL DATA:  Atrial fibrillation. EXAM: PORTABLE CHEST 1 VIEW  COMPARISON:  November 23, 2023. FINDINGS: The heart size and mediastinal contours are within normal limits. Status post coronary artery bypass graft. Both lungs are clear. The visualized skeletal structures are unremarkable. IMPRESSION: No active disease. Electronically Signed   By: Lupita Raider M.D.   On: 12/25/2023 18:12       Errika Narvaiz M.D. [facility] 12/27/2023, 2:42 PM  Available via Epic secure chat 7am-7pm After 7 pm, please refer to night coverage provider listed on amion.

## 2023-12-27 NOTE — Anesthesia Preprocedure Evaluation (Signed)
 Anesthesia Evaluation  Patient identified by MRN, date of birth, ID band Patient awake    Reviewed: Allergy & Precautions, NPO status , Patient's Chart, lab work & pertinent test results  History of Anesthesia Complications Negative for: history of anesthetic complications  Airway Mallampati: II  TM Distance: >3 FB Neck ROM: Full    Dental  (+) Upper Dentures, Lower Dentures, Dental Advisory Given   Pulmonary neg sleep apnea, COPD (occasional home O2 use at night),  oxygen dependent, Patient abstained from smoking., former smoker   Pulmonary exam normal breath sounds clear to auscultation       Cardiovascular hypertension, Pt. on medications (-) angina + CAD, + Past MI, + CABG, + Peripheral Vascular Disease and +CHF   Rhythm:Regular Rate:Normal  Echo 07/2023  1. Left ventricular ejection fraction, by estimation, is 55 to 60%. The left ventricle has normal function. The left ventricle has no regional wall motion abnormalities. There is mild concentric left ventricular hypertrophy. Left ventricular diastolic function could not be evaluated.   2. Right ventricular systolic function is mildly reduced. The right ventricular size is normal.   3. The mitral valve is normal in structure. Trivial mitral valve regurgitation. No evidence of mitral stenosis.   4. The aortic valve is tricuspid. There is mild calcification of the aortic valve. Aortic valve regurgitation is not visualized. Aortic valve sclerosis/calcification is present, without any evidence of aortic stenosis.   5. The inferior vena cava is normal in size with greater than 50% respiratory variability, suggesting right atrial pressure of 3 mmHg.    Cath 08/2022   Prox RCA lesion is 75% stenosed.   Mid RCA lesion is 80% stenosed.   Mid LAD lesion is 60% stenosed.   Mid LM to Dist LM lesion is 85% stenosed.   Ramus lesion is 85% stenosed.   RPAV-1 lesion is 75% stenosed.    Ost Cx lesion is 90% stenosed.   Dist LM to Ost LAD lesion is 85% stenosed.   RPDA lesion is 95% stenosed.   RPAV-2 lesion is 70% stenosed.   1st Mrg lesion is 85% stenosed.   Origin to Prox Graft lesion is 100% stenosed.   Severe multivessel native CAD with 85% distal left main stenosis ostial LAD ramus intermediate and left circumflex stenoses.   The LAD has a percent proximal stenosis prior to the LIMA takeoff and there is competitive filling via the LIMA graft with the LAD injection.   This intermediate stenosis with ostial stenosis but with good competitive filling via the patent graft.   Left circumflex vessel with a 5% ostial stenosis.  The first marginal vessel is small caliber and has diffuse 8085% stenosis in the midsegment.   RCA is a dominant vessel that has diffuse 75% mid stenosis 80 to 70% mid distal stenosis, 95% ostial PDA stenosis with 70% mid continuation branch stenosis.  Competitive filling is seen in the posterolateral vessel.  No competitive filling to the PDA.   Patent vein graft supplying the ramus intermediate vessel.   Old occluded vein graft which had supplied the OM1 vessel.   Patent vein graft which supplies the PLA vessel.  There is 40% stenosis in the distal third portion of the graft.  This graft was sequential graft which also had supplied the PDA vessel which is not visualized at this segment is occluded.     Patent subclavian artery stent arising from the aorta.   Patent LIMA graft supplying the mid LAD.   RECOMMENDATION: Resume  the patient's prior Plavix/Eliquis with history of multi- vessel CAD, subclavian stent, and PAF. Medical therapy for CAD.  Rest of lipid-lowering therapy with target LDL less than 70.    L heart cath 01/23/20: Mildly depressed left ventricular function with inferior hypokinesis EF around 45 to 50% Severe multivessel coronary disease including distal left main ostial LAD ostial circumflex Patent grafts LIMA to the LAD  SVG to distal RCA SVG to ramus Occluded graft to distal circumflex  Echo 01/11/20 NORMAL LEFT VENTRICULAR SYSTOLIC FUNCTION  WITH MILD LVH  NORMAL RIGHT VENTRICULAR SYSTOLIC FUNCTION  TRIVIAL REGURGITATION NOTED   NO VALVULAR STENOSIS  TRIVIAL MR, TR  EF 50-55%  Mild diastolic dysfunction     Neuro/Psych neg Seizures PSYCHIATRIC DISORDERS Anxiety Depression    negative neurological ROS     GI/Hepatic negative GI ROS, Neg liver ROS,,,  Endo/Other  negative endocrine ROSneg diabetes    Renal/GU Renal disease     Musculoskeletal negative musculoskeletal ROS (+)    Abdominal  (+) - obese  Peds  Hematology negative hematology ROS (+)   Anesthesia Other Findings   Reproductive/Obstetrics                             Anesthesia Physical Anesthesia Plan  ASA: 3  Anesthesia Plan: MAC   Post-op Pain Management: Minimal or no pain anticipated   Induction: Intravenous  PONV Risk Score and Plan: 1 and Propofol infusion, TIVA and Treatment may vary due to age or medical condition  Airway Management Planned:   Additional Equipment: Arterial line  Intra-op Plan:   Post-operative Plan:   Informed Consent: I have reviewed the patients History and Physical, chart, labs and discussed the procedure including the risks, benefits and alternatives for the proposed anesthesia with the patient or authorized representative who has indicated his/her understanding and acceptance.     Dental advisory given  Plan Discussed with: CRNA  Anesthesia Plan Comments:         Anesthesia Quick Evaluation

## 2023-12-28 ENCOUNTER — Encounter (HOSPITAL_COMMUNITY): Payer: Self-pay | Admitting: Internal Medicine

## 2023-12-28 ENCOUNTER — Observation Stay (HOSPITAL_BASED_OUTPATIENT_CLINIC_OR_DEPARTMENT_OTHER): Payer: Self-pay | Admitting: Anesthesiology

## 2023-12-28 ENCOUNTER — Observation Stay (HOSPITAL_BASED_OUTPATIENT_CLINIC_OR_DEPARTMENT_OTHER)

## 2023-12-28 ENCOUNTER — Observation Stay (HOSPITAL_COMMUNITY): Payer: Self-pay | Admitting: Anesthesiology

## 2023-12-28 ENCOUNTER — Encounter (HOSPITAL_COMMUNITY)
Admission: EM | Disposition: A | Discharge status: Home / Self Care | Payer: Self-pay | Source: Home / Self Care | Attending: Emergency Medicine

## 2023-12-28 DIAGNOSIS — I4892 Unspecified atrial flutter: Secondary | ICD-10-CM | POA: Diagnosis not present

## 2023-12-28 DIAGNOSIS — J449 Chronic obstructive pulmonary disease, unspecified: Secondary | ICD-10-CM

## 2023-12-28 DIAGNOSIS — I5033 Acute on chronic diastolic (congestive) heart failure: Secondary | ICD-10-CM | POA: Diagnosis not present

## 2023-12-28 DIAGNOSIS — I361 Nonrheumatic tricuspid (valve) insufficiency: Secondary | ICD-10-CM

## 2023-12-28 DIAGNOSIS — F418 Other specified anxiety disorders: Secondary | ICD-10-CM

## 2023-12-28 DIAGNOSIS — F419 Anxiety disorder, unspecified: Secondary | ICD-10-CM | POA: Diagnosis not present

## 2023-12-28 DIAGNOSIS — I5032 Chronic diastolic (congestive) heart failure: Secondary | ICD-10-CM | POA: Diagnosis not present

## 2023-12-28 DIAGNOSIS — I6529 Occlusion and stenosis of unspecified carotid artery: Secondary | ICD-10-CM | POA: Diagnosis not present

## 2023-12-28 DIAGNOSIS — I110 Hypertensive heart disease with heart failure: Secondary | ICD-10-CM | POA: Diagnosis not present

## 2023-12-28 DIAGNOSIS — I4891 Unspecified atrial fibrillation: Secondary | ICD-10-CM | POA: Diagnosis not present

## 2023-12-28 DIAGNOSIS — J4489 Other specified chronic obstructive pulmonary disease: Secondary | ICD-10-CM | POA: Diagnosis not present

## 2023-12-28 DIAGNOSIS — I2510 Atherosclerotic heart disease of native coronary artery without angina pectoris: Secondary | ICD-10-CM

## 2023-12-28 DIAGNOSIS — I351 Nonrheumatic aortic (valve) insufficiency: Secondary | ICD-10-CM

## 2023-12-28 DIAGNOSIS — Z951 Presence of aortocoronary bypass graft: Secondary | ICD-10-CM | POA: Diagnosis not present

## 2023-12-28 DIAGNOSIS — F411 Generalized anxiety disorder: Secondary | ICD-10-CM | POA: Diagnosis not present

## 2023-12-28 DIAGNOSIS — I10 Essential (primary) hypertension: Secondary | ICD-10-CM | POA: Diagnosis not present

## 2023-12-28 DIAGNOSIS — I739 Peripheral vascular disease, unspecified: Secondary | ICD-10-CM | POA: Diagnosis not present

## 2023-12-28 DIAGNOSIS — E785 Hyperlipidemia, unspecified: Secondary | ICD-10-CM | POA: Diagnosis not present

## 2023-12-28 HISTORY — PX: TRANSESOPHAGEAL ECHOCARDIOGRAM (CATH LAB): EP1270

## 2023-12-28 HISTORY — PX: CARDIOVERSION: EP1203

## 2023-12-28 LAB — CBC
HCT: 43.7 % (ref 39.0–52.0)
Hemoglobin: 14.3 g/dL (ref 13.0–17.0)
MCH: 29.7 pg (ref 26.0–34.0)
MCHC: 32.7 g/dL (ref 30.0–36.0)
MCV: 90.7 fL (ref 80.0–100.0)
Platelets: 166 10*3/uL (ref 150–400)
RBC: 4.82 MIL/uL (ref 4.22–5.81)
RDW: 15.2 % (ref 11.5–15.5)
WBC: 7.9 10*3/uL (ref 4.0–10.5)
nRBC: 0 % (ref 0.0–0.2)

## 2023-12-28 LAB — PROTIME-INR
INR: 2.2 — ABNORMAL HIGH (ref 0.8–1.2)
Prothrombin Time: 24.4 s — ABNORMAL HIGH (ref 11.4–15.2)

## 2023-12-28 LAB — RENAL FUNCTION PANEL
Albumin: 3.3 g/dL — ABNORMAL LOW (ref 3.5–5.0)
Anion gap: 8 (ref 5–15)
BUN: 16 mg/dL (ref 8–23)
CO2: 33 mmol/L — ABNORMAL HIGH (ref 22–32)
Calcium: 8.7 mg/dL — ABNORMAL LOW (ref 8.9–10.3)
Chloride: 100 mmol/L (ref 98–111)
Creatinine, Ser: 1.02 mg/dL (ref 0.61–1.24)
GFR, Estimated: >60 mL/min (ref >60)
Glucose, Bld: 94 mg/dL (ref 70–99)
Phosphorus: 3.2 mg/dL (ref 2.5–4.6)
Potassium: 3.9 mmol/L (ref 3.5–5.1)
Sodium: 141 mmol/L (ref 135–145)

## 2023-12-28 LAB — ECHO TEE

## 2023-12-28 SURGERY — TRANSESOPHAGEAL ECHOCARDIOGRAM (TEE) (CATHLAB)
Anesthesia: Monitor Anesthesia Care

## 2023-12-28 MED ORDER — LEVALBUTEROL HCL 1.25 MG/3ML IN NEBU: 1.2500 mg | INHALATION_SOLUTION | Freq: Four times a day (QID) | RESPIRATORY_TRACT | Status: AC | PRN

## 2023-12-28 MED ORDER — DILTIAZEM HCL ER COATED BEADS 360 MG PO CP24: 360.0000 mg | ORAL_CAPSULE | Freq: Every morning | ORAL | Status: DC

## 2023-12-28 MED ORDER — PROPOFOL 500 MG/50ML IV EMUL
INTRAVENOUS | Status: DC | PRN
  Administered 2023-12-28: 75 ug/kg/min via INTRAVENOUS

## 2023-12-28 MED ORDER — PHENYLEPHRINE HCL (PRESSORS) 10 MG/ML IV SOLN
INTRAVENOUS | Status: DC | PRN
  Administered 2023-12-28 (×2): 160 ug via INTRAVENOUS

## 2023-12-28 MED ORDER — PROPOFOL 10 MG/ML IV BOLUS
INTRAVENOUS | Status: DC | PRN
  Administered 2023-12-28: 30 mg via INTRAVENOUS

## 2023-12-28 MED ORDER — SODIUM CHLORIDE 0.9 % IV SOLN: INTRAVENOUS | Status: DC | PRN

## 2023-12-28 SURGICAL SUPPLY — 1 items: PAD DEFIB RADIO PHYSIO CONN (PAD) ×1 IMPLANT

## 2023-12-28 NOTE — CV Procedure (Signed)
   TRANSESOPHAGEAL ECHOCARDIOGRAM GUIDED DIRECT CURRENT CARDIOVERSION  NAME:  Dennis Preston    MRN: [id number] DOB:  1951/03/09    ADMIT DATE: 12/25/2023  INDICATIONS: Symptomatic atrial fibrillation   PROCEDURE:   Informed consent was obtained prior to the procedure. The risks, benefits and alternatives for the procedure were discussed and the patient comprehended these risks.  Risks include, but are not limited to, cough, sore throat, vomiting, nausea, somnolence, esophageal and stomach trauma or perforation, bleeding, low blood pressure, aspiration, pneumonia, infection, trauma to the teeth and death.    After a procedural time-out, the oropharynx was anesthetized and the patient was sedated by the anesthesia service. The transesophageal probe was inserted in the esophagus and stomach without difficulty and multiple views were obtained. Anesthesia was monitored by Dr. Twanna Hy.   COMPLICATIONS:    Complications: No complications Patient tolerated procedure well.  KEY FINDINGS:  No LAA thrombus.  Normal LVEF 55-60% Full Report to follow.   CARDIOVERSION:     Indications:  Symptomatic Atrial Fibrillation  Procedure Details:  Once the TEE was complete, the patient had the defibrillator pads placed in the anterior and posterior position. Once an appropriate level of sedation was confirmed, the patient was cardioverted x 1 with 200J of biphasic synchronized energy.  The patient converted to NSR.  There were no apparent complications.  The patient had normal neuro status and respiratory status post procedure with vitals stable as recorded elsewhere.  Adequate airway was maintained throughout and vital signs monitored per protocol.  Gerri Spore T. Flora Lipps, MD [facility]  [address] [place], [place] [address] [phone]  11:44 AM

## 2023-12-28 NOTE — Progress Notes (Signed)
 [facility]                                                                              Lynell Kussman, is a 73 y.o. male, DOB - [birth year], ZOX:[id number] Admit date - 12/25/2023    Outpatient Primary MD for the patient is Hande, Roderic Palau, MD  LOS - 0  days  Chief Complaint  Patient presents with   Atrial Fibrillation       Brief summary   Patient is a 73 year old male with hypertension, hyperlipidemia, atrial fibrillation, PAD status post bifemoral bypass, CAD status post CABG, carotid disease status post endarterectomy, chronic diastolic CHF, COPD, anxiety, depression presented with A-fib with RVR. Patient was admitted 2/24-2/27 with a flutter with RVR, improved with diltiazem.  Patient was seen by cardiology but unable to cardiovert due to subtherapeutic INR and recommended outpatient follow-up. Day before admission, started feeling unwell after using the nebulizer treatment.  He went to the fire station and noted A-fib with RVR with heart rate 150s to 160s.  EMS was called and the patient received 500 cc IV fluids and IV diltiazem.  In ED, he was placed on IV diltiazem drip but continued to be in a flutter. Cardiology consulted and patient was admitted.   Assessment & Plan    Principal Problem:   Atrial flutter with rapid ventricular response (HCC), variable block -Recent admission 2/24 to 2/27 for a flutter with RVR, treated with diltiazem, not cardioverted due to subtherapeutic INR now presented again with the same -Initially started on IV Cardizem drip, cardiology consulted now transitioned to oral Cardizem 90 mg every 6 hours -Continued on warfarin. Per patient, he has not been able to afford DOAC once his insurance was switched, was high co-pay), will have pharmacy reassess cost -Plan for TEE/DCCV today    Hypertension -BP stable, continue diltiazem, Lasix, Aldactone    Hyperlipidemia - Continue atorvastatin   PAD -Status post aortobifemoral  bypass -Currently on Plavix, statin   CAD status post CABG -Continue Plavix, atorvastatin, -Cardiology following, warfarin as above    Carotid artery disease -Status post endarterectomy - Continue Plavix, atorvastatin - Warfarin as above   Chronic diastolic CHF - Last echo was in November 2024 with EF 55-60%, indeterminate diastolic function, mildly reduced RV function. -Currently stable, euvolemic   COPD  -Appears to his have history of oxygen use at night as well. - Replace home Trelegy with formulary Breo and Incruse - As needed Xopenex   Anxiety Depression - Continue home diazepam  Estimated body mass index is 25.61 kg/m as calculated from the following:   Height as of this encounter: 5\' 5"  (1.651 m).   Weight as of this encounter: 69.8 kg.  Code Status: Full code DVT Prophylaxis:  Warfarin   Level of Care: Level of care: Progressive Family Communication: Updated patient Disposition Plan:      Remains inpatient appropriate:   Awaiting TEE/DCCV prior to my encounter.  Will follow cardiology recommendations regarding discharge.   Procedures:    Consultants:   Cardiology  Antimicrobials:   Anti-infectives (From admission, onward)    None  Medications  atorvastatin  80 mg Oral q1800   clopidogrel  75 mg Oral QHS   diazepam  5 mg Oral QHS   diltiazem  90 mg Oral Q6H   fluticasone furoate-vilanterol  1 puff Inhalation Daily   And   umeclidinium bromide  1 puff Inhalation Daily   furosemide  40 mg Oral Daily   And   furosemide  20 mg Oral QPM   sodium chloride flush  3 mL Intravenous Q12H   spironolactone  12.5 mg Oral Daily   Warfarin - Pharmacist Dosing Inpatient   Does not apply q1600      Subjective:   Dennis Preston was seen and examined today.  No acute complaints, no chest pain, shortness of breath, fevers or chills.    Objective:   Vitals:   12/28/23 1155 12/28/23 1200 12/28/23 1205 12/28/23 1222  BP: 125/76 (!) 119/55  122/75 121/75  Pulse: 70 63 65 67  Resp: 18 (!) 22 20 18   Temp: 97.9 F (36.6 C)   97.9 F (36.6 C)  TempSrc: Temporal   Oral  SpO2: 99% 94% 93% 97%  Weight:      Height:        Intake/Output Summary (Last 24 hours) at 12/28/2023 1349 Last data filed at 12/28/2023 1241 Gross per 24 hour  Intake 340 ml  Output 675 ml  Net -335 ml     Wt Readings from Last 3 Encounters:  12/25/23 69.8 kg  11/26/23 72 kg  08/26/23 73.4 kg   Physical Exam General: Alert and oriented x 3, NAD Cardiovascular: S1 S2 clear, RRR.  Respiratory: CTAB, no wheezing Gastrointestinal: Soft, nontender, nondistended, NBS Ext: no pedal edema bilaterally Neuro: no new deficits Psych: Normal affect     Data Reviewed:  I have personally reviewed following labs    CBC Lab Results  Component Value Date   WBC 7.9 12/28/2023   RBC 4.82 12/28/2023   HGB 14.3 12/28/2023   HCT 43.7 12/28/2023   MCV 90.7 12/28/2023   MCH 29.7 12/28/2023   PLT 166 12/28/2023   MCHC 32.7 12/28/2023   RDW 15.2 12/28/2023   LYMPHSABS 0.7 08/24/2023   MONOABS 1.0 08/24/2023   EOSABS 0.1 08/24/2023   BASOSABS 0.0 08/24/2023     Last metabolic panel Lab Results  Component Value Date   NA 141 12/28/2023   K 3.9 12/28/2023   CL 100 12/28/2023   CO2 33 (H) 12/28/2023   BUN 16 12/28/2023   CREATININE 1.02 12/28/2023   GLUCOSE 94 12/28/2023   GFRNONAA >60 12/28/2023   GFRAA >60 06/22/2020   CALCIUM 8.7 (L) 12/28/2023   PHOS 3.2 12/28/2023   PROT 5.2 (L) 12/26/2023   ALBUMIN 3.3 (L) 12/28/2023   BILITOT 0.4 12/26/2023   ALKPHOS 65 12/26/2023   AST 15 12/26/2023   ALT 18 12/26/2023   ANIONGAP 8 12/28/2023    CBG (last 3)  No results for input(s): "GLUCAP" in the last 72 hours.    Coagulation Profile: Recent Labs  Lab 12/25/23 1725 12/26/23 0337 12/27/23 1230 12/28/23 0534  INR 2.1* 2.1* 2.2* 2.2*     Radiology Studies: I have personally reviewed the imaging studies  ECHO TEE Result Date:  12/28/2023    TRANSESOPHOGEAL ECHO REPORT   Patient Name:   Dennis Preston Date of Exam: 12/28/2023 Medical Rec #:  [id number]       Height:       65.0 in Accession #:    [id number]  Weight:       153.9 lb Date of Birth:  04-27-1951      BSA:          1.770 m Patient Age:    72 years        BP:           139/63 mmHg Patient Gender: M               HR:           60 bpm. Exam Location:  Inpatient Procedure: Transesophageal Echo, 3D Echo, Color Doppler and Cardiac Doppler            (Both Spectral and Color Flow Doppler were utilized during            procedure). Indications:    I48.92* Unspecified atrial flutter  History:        Patient has prior history of Echocardiogram examinations, most                 recent 08/25/2023. CAD, COPD and PAD, Arrythmias:Atrial                 Fibrillation; Risk Factors:Hypertension and Dyslipidemia.  Sonographer:    Rosaland Lao Sonographer#2:  Irving Burton Senior RDCS Referring Phys: [id number] Corrin Parker PROCEDURE: After discussion of the risks and benefits of a TEE, an informed consent was obtained from the patient. TEE procedure time was 8 minutes. The transesophogeal probe was passed without difficulty through the esophogus of the patient. Imaged were  obtained with the patient in a left lateral decubitus position. Sedation performed by different physician. The patient was monitored while under deep sedation. Anesthestetic sedation was provided intravenously by Anesthesiology: 120mg  of Propofol, 60mg  of Lidocaine. Image quality was excellent. The patient's vital signs; including heart rate, blood pressure, and oxygen saturation; remained stable throughout the procedure. The patient developed no complications during the procedure. A successful direct current cardioversion was performed at 200 joules with 1 attempt.  IMPRESSIONS  1. Left ventricular ejection fraction, by estimation, is 55 to 60%. Left ventricular ejection fraction by 3D volume is 58 %. The left ventricle has  normal function.  2. Right ventricular systolic function is normal. The right ventricular size is normal.  3. Left atrial size was mild to moderately dilated. No left atrial/left atrial appendage thrombus was detected. The LAA emptying velocity was 52 cm/s.  4. Right atrial size was mildly dilated.  5. The mitral valve is grossly normal. Trivial mitral valve regurgitation. No evidence of mitral stenosis.  6. The aortic valve is tricuspid. Aortic valve regurgitation is mild. No aortic stenosis is present.  7. There is mild (Grade II) plaque involving the descending aorta and aortic arch.  8. 3D performed for the EF and 3D performed of the LAA and demonstrates 3D LVEF normal. 3D LAA without thrombus. Conclusion(s)/Recommendation(s): No LA/LAA thrombus identified. Successful cardioversion performed with restoration of normal sinus rhythm. FINDINGS  Left Ventricle: Left ventricular ejection fraction, by estimation, is 55 to 60%. Left ventricular ejection fraction by 3D volume is 58 %. The left ventricle has normal function. The left ventricular internal cavity size was normal in size. Right Ventricle: The right ventricular size is normal. No increase in right ventricular wall thickness. Right ventricular systolic function is normal. Left Atrium: Left atrial size was mild to moderately dilated. No left atrial/left atrial appendage thrombus was detected. The LAA emptying velocity was 52 cm/s. Right Atrium: Right atrial size was mildly dilated. Pericardium: There is  no evidence of pericardial effusion. Mitral Valve: The mitral valve is grossly normal. Trivial mitral valve regurgitation. No evidence of mitral valve stenosis. Tricuspid Valve: The tricuspid valve is grossly normal. Tricuspid valve regurgitation is mild . No evidence of tricuspid stenosis. Aortic Valve: The aortic valve is tricuspid. Aortic valve regurgitation is mild. No aortic stenosis is present. Pulmonic Valve: The pulmonic valve was grossly normal.  Pulmonic valve regurgitation is trivial. No evidence of pulmonic stenosis. Aorta: The aortic root and ascending aorta are structurally normal, with no evidence of dilitation. There is mild (Grade II) plaque involving the descending aorta and aortic arch. IAS/Shunts: The atrial septum is grossly normal. Additional Comments: Spectral Doppler performed.  3D Volume EF LV 3D EF:    Left ventricular ejection fraction by 3D volume is 58              %.  3D Volume EF LV 3D EF:    58.30 % LV 3D EDV:   45700.00 mm LV 3D ESV:   19100.00 mm LV 3D SV:    26700.00 mm  3D Volume EF: 3D EF:        58 %  AORTA Ao Root diam: 3.29 cm Ao Asc diam:  3.05 cm Lennie Odor MD Electronically signed by Lennie Odor MD Signature Date/Time: 12/28/2023/12:17:58 PM    Final    EP STUDY Result Date: 12/28/2023 See surgical note for result.      Thad Ranger M.D. [facility] 12/28/2023, 1:49 PM  Available via Epic secure chat 7am-7pm After 7 pm, please refer to night coverage provider listed on amion.

## 2023-12-28 NOTE — Anesthesia Postprocedure Evaluation (Signed)
 Anesthesia Post Note  Patient: Dennis Preston  Procedure(s) Performed: TRANSESOPHAGEAL ECHOCARDIOGRAM CARDIOVERSION     Patient location during evaluation: PACU Anesthesia Type: MAC Level of consciousness: awake and alert Pain management: pain level controlled Vital Signs Assessment: post-procedure vital signs reviewed and stable Respiratory status: spontaneous breathing Cardiovascular status: stable Anesthetic complications: no   No notable events documented.  Last Vitals:  Vitals:   12/28/23 1205 12/28/23 1222  BP: 122/75 121/75  Pulse: 65 67  Resp: 20 18  Temp:  36.6 C  SpO2: 93% 97%    Last Pain:  Vitals:   12/28/23 1222  TempSrc: Oral  PainSc: 0-No pain                 [name]

## 2023-12-28 NOTE — Plan of Care (Signed)
  Problem: Clinical Measurements: Goal: Will remain free from infection Outcome: Progressing Goal: Respiratory complications will improve Outcome: Progressing Goal: Cardiovascular complication will be avoided Outcome: Progressing   Problem: Nutrition: Goal: Adequate nutrition will be maintained Outcome: Progressing   Problem: Coping: Goal: Level of anxiety will decrease Outcome: Progressing   Problem: Elimination: Goal: Will not experience complications related to urinary retention Outcome: Progressing   Problem: Safety: Goal: Ability to remain free from injury will improve Outcome: Progressing   Problem: Cardiac: Goal: Ability to achieve and maintain adequate cardiopulmonary perfusion will improve Outcome: Progressing

## 2023-12-28 NOTE — Transfer of Care (Signed)
 Immediate Anesthesia Transfer of Care Note  Patient: Dennis Preston  Procedure(s) Performed: TRANSESOPHAGEAL ECHOCARDIOGRAM CARDIOVERSION  Patient Location: Cath Lab  Anesthesia Type:MAC  Level of Consciousness: awake, alert , oriented, and patient cooperative  Airway & Oxygen Therapy: Patient Spontanous Breathing and Patient connected to face mask oxygen  Post-op Assessment: Report given to RN, Post -op Vital signs reviewed and stable, Patient moving all extremities, Patient moving all extremities X 4, and Patient able to stick tongue midline  Post vital signs: Reviewed and stable  Last Vitals:  Vitals Value Taken Time  BP    Temp    Pulse 62 12/28/23 1148  Resp    SpO2 94 % 12/28/23 1148  Vitals shown include unfiled device data.  Last Pain:  Vitals:   12/28/23 0841  TempSrc:   PainSc: 0-No pain      Patients Stated Pain Goal: 0 (12/27/23 2041)  Complications: No notable events documented.

## 2023-12-28 NOTE — Progress Notes (Signed)
   Patient Name: Dennis Preston Date of Encounter: 12/28/2023 [facility] Cardiologist: Dr. Juliann Pares ([facility])  Interval Summary  .    Seen while awaiting TEE-CV. Generally feels well  Vital Signs .    Vitals:   12/28/23 0533 12/28/23 0756 12/28/23 0834 12/28/23 0841  BP: (!) 117/55 (!) 140/73 (!) 121/57   Pulse:  62 66   Resp:  18 16   Temp:  (!) 97.5 F (36.4 C) (!) 97.5 F (36.4 C)   TempSrc:  Oral Temporal   SpO2: 97% 99% 94% 97%  Weight:      Height:       Intake/Output Summary (Last 24 hours) at 12/28/2023 1135 Last data filed at 12/28/2023 0756 Gross per 24 hour  Intake --  Output 1675 ml  Net -1675 ml      12/25/2023    9:00 PM 12/25/2023    5:26 PM 11/26/2023    4:34 AM  Last 3 Weights  Weight (lbs) 153 lb 14.1 oz 157 lb 158 lb 11.2 oz  Weight (kg) 69.8 kg 71.215 kg 71.986 kg     Telemetry/ECG    Atrial flutter - Personally Reviewed  Physical Exam .   GEN: No acute distress.   Neck: No JVD Cardiac: RRR, no murmurs, rubs, or gallops.  Respiratory: Clear to auscultation bilaterally. GI: Soft, nontender, non-distended  MS: No edema  Assessment & Plan .     Atrial flutter with RVR, variable block  CHA2DS2-VASc score of 6, on warfarin  Known history of atrial fibrillation/flutter with recent admission of atrial flutter with RVR -- was unable to undergo cardioversion due to subtherapeutic INR  Echo from 07/2023 showed: EF 55-60%, mild LVH, mildly reduced RV function, trivial MR, normal IVC Undergoing TEE guided DCCV today On warfain as opposed to DOAC, due to cost Not good antiarrhythmic candidate due to borderline QT and CAD  Recommend discussion of flutter ablation as an outpatient  Hypertension  will adjust medications post-DCCV pending results and vitals  Currently on diltiazem 90 mg every 6 hours prior to cardioversion   Chronic HFpEF Echo from 07/2023 showed: EF 55-60%, mild LVH, mildly reduced RV function, trivial MR, normal IVC CXR  this admission showed normal heart size, s/p CABG, no active disease  Euvolemic on exam   Currently on PO Lasix 40 mg AM and 20 mg PM, spironolactone 12.5 mg daily   History of CAD s/p CABG x 3 (2016) Carotid artery disease Hyperlipidemia, goal LDL < 70  PAD s/p aortobifemoral bypass (2021) LHC 08/2022 showed: severe multivessel native CAD with recommendations to continue medical therapy  Lipid panel from 11/2022 showed: total 128, HDL 57, LDL 55  Continue Lipitor 80 mg daily Continue Plavix 75 mg daily  Continue Warfarin per pharmacy dosing   Per primary COPD Anxiety, depression   If he converts, can likely be discharged later today unless significant medication changes needed due to vitals.  For questions or updates, please contact [facility] Please consult [email or web address] for contact info under        Signed, Jodelle Red, MD

## 2023-12-28 NOTE — Progress Notes (Signed)
   12/28/23 1222  Vitals  Temp 97.9 F (36.6 C)  Temp Source Oral  BP 121/75  MAP (mmHg) 88  BP Location Right Arm  BP Method Automatic  Patient Position (if appropriate) Lying  Pulse Rate 67  Pulse Rate Source Monitor  Resp 18  Level of Consciousness  Level of Consciousness Alert  Oxygen Therapy  SpO2 97 %  O2 Device Nasal Cannula  O2 Flow Rate (L/min) 3 L/min   Patient returned to unit from Endo. Patient denied any distress. Family at bedside.

## 2023-12-28 NOTE — Interval H&P Note (Signed)
 History and Physical Interval Note:  12/28/2023 8:57 AM  Dennis Preston  has presented today for surgery, with the diagnosis of afib.  The various methods of treatment have been discussed with the patient and family. After consideration of risks, benefits and other options for treatment, the patient has consented to  Procedure(s): TRANSESOPHAGEAL ECHOCARDIOGRAM (N/A) CARDIOVERSION (N/A) as a surgical intervention.  The patient's history has been reviewed, patient examined, no change in status, stable for surgery.  I have reviewed the patient's chart and labs.  Questions were answered to the patient's satisfaction.    NPO for TEE/DCCV. On warfarin.   Dennis Preston T. Flora Lipps, MD, [facility]  [facility]  [address], [address] [place], [place] [address] [phone]  8:57 AM

## 2023-12-28 NOTE — Discharge Summary (Signed)
 Physician Discharge Summary   Patient: Dennis Preston MRN: [id number] DOB: [birth year]  Admit date:     12/25/2023  Discharge date: 12/28/23  Discharge Physician: Thad Ranger, MD    PCP: Barbette Reichmann, MD   Recommendations at discharge:   Obtain PT/INR in 3 days, outpatient follow-up with Dr. Juliann Pares (cardiology) in 10 to 14 days  Discharge Diagnoses:    Atrial flutter with rapid ventricular response (HCC)   Anxiety state   COPD (chronic obstructive pulmonary disease) with chronic bronchitis (HCC)   Coronary artery disease   PAD (peripheral artery disease) (HCC)   Essential hypertension   Hyperlipidemia   Chronic heart failure with preserved ejection fraction (HFpEF) [facility])    Hospital Course:  Patient is a 73 year old male with hypertension, hyperlipidemia, atrial fibrillation, PAD status post bifemoral bypass, CAD status post CABG, carotid disease status post endarterectomy, chronic diastolic CHF, COPD, anxiety, depression presented with A-fib with RVR. Patient was admitted 2/24-2/27 with a flutter with RVR, improved with diltiazem.  Patient was seen by cardiology but unable to cardiovert due to subtherapeutic INR and recommended outpatient follow-up. Day before admission, started feeling unwell after using the nebulizer treatment.  He went to the fire station and noted A-fib with RVR with heart rate 150s to 160s.  EMS was called and the patient received 500 cc IV fluids and IV diltiazem.  In ED, he was placed on IV diltiazem drip but continued to be in a flutter. Cardiology consulted and patient was admitted.   Assessment and Plan:    Atrial flutter with rapid ventricular response (HCC), variable block -Recent admission 2/24 to 2/27 for a flutter with RVR, treated with diltiazem, not cardioverted due to subtherapeutic INR now presented again with the same -Initially was placed on IV Cardizem drip then transition to oral Cardizem.   -Continued on warfarin. Per patient,  he has not been able to afford DOAC once his insurance was switched, was high co-pay). -Underwent TEE/DCCV today, cleared by cardiology, Dr. Cristal Deer to discharge home, resume oral Cardizem 360 mg daily tomorrow   Hypertension -BP stable, continue diltiazem, Lasix, Aldactone     Hyperlipidemia - Continue atorvastatin   PAD -Status post aortobifemoral bypass -Currently on Plavix, statin     CAD status post CABG -Continue Plavix, atorvastatin, -Continue warfarin, INR 2.1 at discharge     Carotid artery disease -Status post endarterectomy - Continue Plavix, atorvastatin - Warfarin as above   Chronic diastolic CHF - Last echo was in November 2024 with EF 55-60%, indeterminate diastolic function, mildly reduced RV function. -Continue Lasix, Aldactone   COPD  -Appears to his have history of oxygen use at night as well. -Continue Trelegy, Xopenex as needed   Anxiety Depression - Continue home diazepam   Estimated body mass index is 25.61 kg/m as calculated from the following:   Height as of this encounter: 5\' 5"  (1.651 m).   Weight as of this encounter: 69.8 kg.        Pain control - [employer] Controlled Substance Reporting System database was reviewed. and patient was instructed, not to drive, operate heavy machinery, perform activities at heights, swimming or participation in water activities or provide baby-sitting services while on Pain, Sleep and Anxiety Medications; until their outpatient Physician has advised to do so again. Also recommended to not to take more than prescribed Pain, Sleep and Anxiety Medications.  Consultants: Cardiology Procedures performed: TEE/DCCV Disposition: Home Diet recommendation:  Discharge Diet Orders (From admission, onward)     Start  Ordered   12/28/23 0000  Diet - low sodium heart healthy        12/28/23 1404            DISCHARGE MEDICATION: Allergies as of 12/28/2023   No Known Allergies      Medication  List     TAKE these medications    atorvastatin 80 MG tablet Commonly known as: LIPITOR Take 1 tablet (80 mg total) by mouth daily at 6 PM. What changed: when to take this   clopidogrel 75 MG tablet Commonly known as: PLAVIX Take 75 mg by mouth in the morning.   colchicine 0.6 MG tablet Take 0.6 mg by mouth daily as needed (gout flare).   diazepam 5 MG tablet Commonly known as: VALIUM Take 1 tablet (5 mg total) by mouth at bedtime.   diltiazem 360 MG 24 hr capsule Commonly known as: CARDIZEM CD Take 1 capsule (360 mg total) by mouth in the morning. Start taking on: December 29, 2023   furosemide 40 MG tablet Commonly known as: LASIX Take 40 mg in the morning and 20 mg (1/2 tablet) in the evening What changed:  how much to take how to take this when to take this additional instructions   levalbuterol 1.25 MG/3ML nebulizer solution Commonly known as: XOPENEX Take 1.25 mg by nebulization every 6 (six) hours as needed for wheezing or shortness of breath. What changed:  when to take this reasons to take this   OXYGEN Inhale 2 L into the lungs at bedtime. Uses while sleeping   spironolactone 25 MG tablet Commonly known as: ALDACTONE Take 0.5 tablets (12.5 mg total) by mouth daily. What changed: when to take this   Trelegy Ellipta 100-62.5-25 MCG/INH Aepb Generic drug: Fluticasone-Umeclidin-Vilant Inhale 1 puff into the lungs in the morning.   warfarin 2 MG tablet Commonly known as: COUMADIN Take 2 mg by mouth See admin instructions. Take 1 tablet (2mg ) by mouth at bedtime Monday - Friday.   warfarin 1 MG tablet Commonly known as: COUMADIN Take 1.5 mg by mouth See admin instructions. Take 1.5 tablets (1.5mg ) by mouth at bedtime on Saturday and Sunday        Follow-up Information     Hande, Vishwanath, MD. Schedule an appointment as soon as possible for a visit in 2 week(s).   Specialty: Internal Medicine Why: for hospital follow-up Contact information: [address] [place] [address] [phone]         Alwyn Pea, MD. Schedule an appointment as soon as possible for a visit in 2 week(s).   Specialties: Cardiology, Internal Medicine Why: for hospital follow-up Contact information: [address] [place] [address] [phone]                Discharge Exam: Ceasar Mons Weights   12/25/23 1726 12/25/23 2100  Weight: 71.2 kg 69.8 kg    S: No acute complaints, cleared by cardiology to discharge home after TEE/DCCV  BP 121/75 (BP Location: Right Arm)   Pulse 67   Temp 97.9 F (36.6 C) (Oral)   Resp 18   Ht 5\' 5"  (1.651 m)   Wt 69.8 kg   SpO2 97%   BMI 25.61 kg/m   Physical Exam General: Alert and oriented x 3, NAD Cardiovascular: S1 S2 clear, RRR.  Respiratory: CTAB, no wheezing Gastrointestinal: Soft, nontender, nondistended, NBS Ext: no pedal edema bilaterally Neuro: no new deficits Psych: Normal affect    Condition at discharge: fair  The results of significant diagnostics from this hospitalization (including imaging, microbiology, ancillary and laboratory) are listed below for reference.   Imaging Studies: ECHO TEE Result Date: 12/28/2023    TRANSESOPHOGEAL ECHO REPORT   Patient Name:   TAHMID STONEHOCKER Date of Exam: 12/28/2023 Medical Rec #:  [id number]       Height:       65.0 in Accession #:    [id number]      Weight:       153.9 lb Date of Birth:  01-30-1951      BSA:          1.770 m Patient Age:    72 years        BP:           139/63 mmHg Patient Gender: M               HR:           60 bpm. Exam Location:  Inpatient Procedure: Transesophageal Echo, 3D Echo, Color Doppler and Cardiac Doppler            (Both Spectral and Color Flow Doppler were utilized during            procedure). Indications:    I48.92* Unspecified atrial flutter  History:        Patient has prior history of Echocardiogram examinations, most                 recent 08/25/2023. CAD, COPD and  PAD, Arrythmias:Atrial                 Fibrillation; Risk Factors:Hypertension and Dyslipidemia.  Sonographer:    Rosaland Lao Sonographer#2:  Irving Burton Senior RDCS Referring Phys: [id number] Corrin Parker PROCEDURE: After discussion of the risks and benefits of a TEE, an informed consent was obtained from the patient. TEE procedure time was 8 minutes. The transesophogeal probe was passed without difficulty through the esophogus of the patient. Imaged were  obtained with the patient in a left lateral decubitus position. Sedation performed by different physician. The patient was monitored while under deep sedation. Anesthestetic sedation was provided intravenously by Anesthesiology: 120mg  of Propofol, 60mg  of Lidocaine. Image quality was excellent. The patient's vital signs; including heart rate, blood pressure, and oxygen saturation; remained stable throughout the procedure. The patient developed no complications during the procedure. A successful direct current cardioversion was performed at 200 joules with 1 attempt.  IMPRESSIONS  1. Left ventricular ejection fraction, by estimation, is 55 to 60%. Left ventricular ejection fraction by 3D volume is 58 %. The left ventricle has normal function.  2. Right ventricular systolic function is normal. The right ventricular size is normal.  3. Left atrial size was mild to moderately dilated. No left atrial/left atrial appendage thrombus was detected. The LAA emptying velocity was 52 cm/s.  4. Right atrial size was mildly dilated.  5. The mitral valve is grossly normal. Trivial mitral valve regurgitation. No evidence of mitral stenosis.  6. The aortic valve is tricuspid. Aortic valve regurgitation is mild. No aortic stenosis is present.  7. There is mild (Grade II) plaque involving the descending aorta and aortic arch.  8. 3D performed for the EF and 3D performed of the LAA and demonstrates 3D LVEF normal. 3D LAA without thrombus. Conclusion(s)/Recommendation(s): No LA/LAA  thrombus identified. Successful cardioversion performed with restoration of normal sinus rhythm. FINDINGS  Left Ventricle: Left ventricular ejection fraction, by estimation, is 55 to 60%. Left ventricular ejection fraction  by 3D volume is 58 %. The left ventricle has normal function. The left ventricular internal cavity size was normal in size. Right Ventricle: The right ventricular size is normal. No increase in right ventricular wall thickness. Right ventricular systolic function is normal. Left Atrium: Left atrial size was mild to moderately dilated. No left atrial/left atrial appendage thrombus was detected. The LAA emptying velocity was 52 cm/s. Right Atrium: Right atrial size was mildly dilated. Pericardium: There is no evidence of pericardial effusion. Mitral Valve: The mitral valve is grossly normal. Trivial mitral valve regurgitation. No evidence of mitral valve stenosis. Tricuspid Valve: The tricuspid valve is grossly normal. Tricuspid valve regurgitation is mild . No evidence of tricuspid stenosis. Aortic Valve: The aortic valve is tricuspid. Aortic valve regurgitation is mild. No aortic stenosis is present. Pulmonic Valve: The pulmonic valve was grossly normal. Pulmonic valve regurgitation is trivial. No evidence of pulmonic stenosis. Aorta: The aortic root and ascending aorta are structurally normal, with no evidence of dilitation. There is mild (Grade II) plaque involving the descending aorta and aortic arch. IAS/Shunts: The atrial septum is grossly normal. Additional Comments: Spectral Doppler performed.  3D Volume EF LV 3D EF:    Left ventricular ejection fraction by 3D volume is 58              %.  3D Volume EF LV 3D EF:    58.30 % LV 3D EDV:   45700.00 mm LV 3D ESV:   19100.00 mm LV 3D SV:    26700.00 mm  3D Volume EF: 3D EF:        58 %  AORTA Ao Root diam: 3.29 cm Ao Asc diam:  3.05 cm Lennie Odor MD Electronically signed by Lennie Odor MD Signature Date/Time: 12/28/2023/12:17:58 PM     Final    EP STUDY Result Date: 12/28/2023 See surgical note for result.  DG Chest Port 1 View Result Date: 12/25/2023 CLINICAL DATA:  Atrial fibrillation. EXAM: PORTABLE CHEST 1 VIEW COMPARISON:  November 23, 2023. FINDINGS: The heart size and mediastinal contours are within normal limits. Status post coronary artery bypass graft. Both lungs are clear. The visualized skeletal structures are unremarkable. IMPRESSION: No active disease. Electronically Signed   By: Lupita Raider M.D.   On: 12/25/2023 18:12    Microbiology: Results for orders placed or performed during the hospital encounter of 09/12/22  Resp panel by RT-PCR (RSV, Flu A&B, Covid) Anterior Nasal Swab     Status: None   Collection Time: 09/12/22  7:06 AM   Specimen: Anterior Nasal Swab  Result Value Ref Range Status   SARS Coronavirus 2 by RT PCR NEGATIVE NEGATIVE Final    Comment: (NOTE) SARS-CoV-2 target nucleic acids are NOT DETECTED.  The SARS-CoV-2 RNA is generally detectable in upper respiratory specimens during the acute phase of infection. The lowest concentration of SARS-CoV-2 viral copies this assay can detect is 138 copies/mL. A negative result does not preclude SARS-Cov-2 infection and should not be used as the sole basis for treatment or other patient management decisions. A negative result may occur with  improper specimen collection/handling, submission of specimen other than nasopharyngeal swab, presence of viral mutation(s) within the areas targeted by this assay, and inadequate number of viral copies(<138 copies/mL). A negative result must be combined with clinical observations, patient history, and epidemiological information. The expected result is Negative.  Fact Sheet for Patients:  BloggerCourse.com  Fact Sheet for Healthcare Providers:  SeriousBroker.it  This test is no t yet  approved or cleared by the [place] and  has been  authorized for detection and/or diagnosis of SARS-CoV-2 by FDA under an Emergency Use Authorization (EUA). This EUA will remain  in effect (meaning this test can be used) for the duration of the COVID-19 declaration under Section 564(b)(1) of the Act, 21 U.S.C.section 360bbb-3(b)(1), unless the authorization is terminated  or revoked sooner.       Influenza A by PCR NEGATIVE NEGATIVE Final   Influenza B by PCR NEGATIVE NEGATIVE Final    Comment: (NOTE) The Xpert Xpress SARS-CoV-2/FLU/RSV plus assay is intended as an aid in the diagnosis of influenza from Nasopharyngeal swab specimens and should not be used as a sole basis for treatment. Nasal washings and aspirates are unacceptable for Xpert Xpress SARS-CoV-2/FLU/RSV testing.  Fact Sheet for Patients: BloggerCourse.com  Fact Sheet for Healthcare Providers: SeriousBroker.it  This test is not yet approved or cleared by the [place] FDA and has been authorized for detection and/or diagnosis of SARS-CoV-2 by FDA under an Emergency Use Authorization (EUA). This EUA will remain in effect (meaning this test can be used) for the duration of the COVID-19 declaration under Section 564(b)(1) of the Act, 21 U.S.C. section 360bbb-3(b)(1), unless the authorization is terminated or revoked.     Resp Syncytial Virus by PCR NEGATIVE NEGATIVE Final    Comment: (NOTE) Fact Sheet for Patients: BloggerCourse.com  Fact Sheet for Healthcare Providers: SeriousBroker.it  This test is not yet approved or cleared by the [place] FDA and has been authorized for detection and/or diagnosis of SARS-CoV-2 by FDA under an Emergency Use Authorization (EUA). This EUA will remain in effect (meaning this test can be used) for the duration of the COVID-19 declaration under Section 564(b)(1) of the Act, 21 U.S.C. section 360bbb-3(b)(1), unless the  authorization is terminated or revoked.  Performed at [facility], [address], [place], [place] [address]   Blood culture (routine x 2)     Status: None   Collection Time: 09/12/22 10:16 AM   Specimen: BLOOD LEFT HAND  Result Value Ref Range Status   Specimen Description BLOOD LEFT HAND  Final   Special Requests   Final    BOTTLES DRAWN AEROBIC AND ANAEROBIC Blood Culture results may not be optimal due to an inadequate volume of blood received in culture bottles   Culture   Final    NO GROWTH 5 DAYS Performed at [facility], [address] [place], [place] [address]    Report Status 09/17/2022 FINAL  Final  Blood culture (routine x 2)     Status: None   Collection Time: 09/12/22 10:19 AM   Specimen: BLOOD  Result Value Ref Range Status   Specimen Description BLOOD THUMB  Final   Special Requests   Final    BOTTLES DRAWN AEROBIC ONLY Blood Culture results may not be optimal due to an inadequate volume of blood received in culture bottles   Culture   Final    NO GROWTH 5 DAYS Performed at [facility], [address], [place], [place] [address]    Report Status 09/17/2022 FINAL  Final  MRSA Next Gen by PCR, Nasal     Status: None   Collection Time: 09/12/22 10:29 AM   Specimen: Nasal Mucosa; Nasal Swab  Result Value Ref Range Status   MRSA by PCR Next Gen NOT DETECTED NOT DETECTED Final    Comment: (NOTE) The GeneXpert MRSA Assay (FDA approved for NASAL specimens  only), is one component of a comprehensive MRSA colonization surveillance program. It is not intended to diagnose MRSA infection nor to guide or monitor treatment for MRSA infections. Test performance is not FDA approved in patients less than 90 years old. Performed at [facility], [address] [place], [place] [address]     Labs: CBC: Recent Labs  Lab 12/25/23 1725 12/26/23 0337 12/27/23 0437 12/28/23 0534  WBC 11.7* 7.2 7.8 7.9  HGB 14.7 13.2 13.2 14.3  HCT 44.8 40.0  40.1 43.7  MCV 91.8 91.3 91.3 90.7  PLT 198 165 167 166   Basic Metabolic Panel: Recent Labs  Lab 12/25/23 1725 12/25/23 2031 12/26/23 0337 12/27/23 0437 12/28/23 0534  NA 139  --  140 140 141  K 4.3  --  3.6 3.8 3.9  CL 101  --  104 102 100  CO2 28  --  30 31 33*  GLUCOSE 116*  --  122* 113* 94  BUN 25*  --  20 20 16   CREATININE 1.25*  --  1.02 1.04 1.02  CALCIUM 8.9  --  8.5* 8.6* 8.7*  MG  --  2.0  --   --   --   PHOS  --   --   --  2.9 3.2   Liver Function Tests: Recent Labs  Lab 12/26/23 0337 12/27/23 0437 12/28/23 0534  AST 15  --   --   ALT 18  --   --   ALKPHOS 65  --   --   BILITOT 0.4  --   --   PROT 5.2*  --   --   ALBUMIN 3.2* 3.2* 3.3*   CBG: No results for input(s): "GLUCAP" in the last 168 hours.  Discharge time spent: greater than 30 minutes.  Signed: Thad Ranger, MD [facility] 12/28/2023

## 2023-12-28 NOTE — Progress Notes (Signed)
 Patient left unit for Endo.

## 2023-12-28 NOTE — Progress Notes (Signed)
 Bradycardia and hypotensive Patient's heart rate is 40-50 however still having atrial flutter.  Also blood pressure is soft 110/58. In the setting of bradycardia holding 6 AM dose of Cardizem.  Last dose of Cardizem at 12:30 AM. Patient is going to have cardioversion today.  Tereasa Coop, MD [facility] 12/28/2023, 5:36 AM

## 2023-12-29 ENCOUNTER — Encounter (HOSPITAL_COMMUNITY): Payer: Self-pay | Admitting: Cardiovascular Disease

## 2024-01-01 DIAGNOSIS — G4733 Obstructive sleep apnea (adult) (pediatric): Secondary | ICD-10-CM | POA: Diagnosis not present

## 2024-01-01 DIAGNOSIS — I771 Stricture of artery: Secondary | ICD-10-CM | POA: Diagnosis not present

## 2024-01-01 DIAGNOSIS — J4489 Other specified chronic obstructive pulmonary disease: Secondary | ICD-10-CM | POA: Diagnosis not present

## 2024-01-01 DIAGNOSIS — Z9889 Other specified postprocedural states: Secondary | ICD-10-CM | POA: Diagnosis not present

## 2024-01-01 DIAGNOSIS — I480 Paroxysmal atrial fibrillation: Secondary | ICD-10-CM | POA: Diagnosis not present

## 2024-01-01 DIAGNOSIS — I10 Essential (primary) hypertension: Secondary | ICD-10-CM | POA: Diagnosis not present

## 2024-01-01 DIAGNOSIS — Z720 Tobacco use: Secondary | ICD-10-CM | POA: Diagnosis not present

## 2024-01-01 DIAGNOSIS — Z7901 Long term (current) use of anticoagulants: Secondary | ICD-10-CM | POA: Diagnosis not present

## 2024-01-01 DIAGNOSIS — J449 Chronic obstructive pulmonary disease, unspecified: Secondary | ICD-10-CM | POA: Diagnosis not present

## 2024-01-07 DIAGNOSIS — I480 Paroxysmal atrial fibrillation: Secondary | ICD-10-CM | POA: Diagnosis not present

## 2024-01-07 DIAGNOSIS — Z7901 Long term (current) use of anticoagulants: Secondary | ICD-10-CM | POA: Diagnosis not present

## 2024-01-12 ENCOUNTER — Ambulatory Visit (INDEPENDENT_AMBULATORY_CARE_PROVIDER_SITE_OTHER): Payer: Medicare Other | Admitting: Vascular Surgery

## 2024-01-12 ENCOUNTER — Encounter (INDEPENDENT_AMBULATORY_CARE_PROVIDER_SITE_OTHER): Payer: Medicare Other

## 2024-01-19 ENCOUNTER — Encounter: Payer: Self-pay | Admitting: Cardiology

## 2024-01-19 ENCOUNTER — Ambulatory Visit: Attending: Cardiology | Admitting: Cardiology

## 2024-01-19 VITALS — BP 130/78 | HR 83 | Ht 65.0 in

## 2024-01-19 DIAGNOSIS — I483 Typical atrial flutter: Secondary | ICD-10-CM

## 2024-01-19 DIAGNOSIS — I5032 Chronic diastolic (congestive) heart failure: Secondary | ICD-10-CM

## 2024-01-19 DIAGNOSIS — D6869 Other thrombophilia: Secondary | ICD-10-CM

## 2024-01-19 DIAGNOSIS — J449 Chronic obstructive pulmonary disease, unspecified: Secondary | ICD-10-CM

## 2024-01-19 DIAGNOSIS — I480 Paroxysmal atrial fibrillation: Secondary | ICD-10-CM

## 2024-01-19 DIAGNOSIS — Z951 Presence of aortocoronary bypass graft: Secondary | ICD-10-CM

## 2024-01-19 NOTE — Progress Notes (Unsigned)
 Electrophysiology Office Note:   Date:  01/20/2024  ID:  CULLIN DISHMAN, DOB 1951-02-10, MRN [id number]  Primary Cardiologist: None Electrophysiologist: Ardeen Kohler, MD      History of Present Illness:   Dennis Preston is a 73 y.o. male with h/o accessible atrial fibrillation, obstructive sleep apnea, COPD, hypertension, carotid endarterectomy, PAD status post bifemoral bypass, CAD status post CABG x 4, chronic diastolic heart failure who is being seen today for evaluation for catheter ablation at the request of Dr. Beau Bound..   Discussed the use of AI scribe software for clinical note transcription with the patient, who gave verbal consent to proceed.  History of Present Illness He has a history of atrial fibrillation, first noted approximately eight years ago following cardiac bypass surgery. He experiences episodes of high heart rates, reaching up to 140 beats per minute, leading to hospital admissions, most recently 2/24 through 2/27. Symptoms during these episodes include shortness of breath and a sensation of his heart 'skipping and racing'. More recently, he has been experiencing atrial flutter, as noted on recent EKGs. These episodes often occur in the early morning hours, around 2-3 AM. He feels weak since a recent cardioversion, although his heart rate has not been as elevated since then.He has a history of COPD and uses oxygen at night, typically at a rate of two liters. He does not use oxygen during the day unless needed. He is currently on warfarin, with recent INR checks showing therapeutic levels between 2 and 3. He also takes diltiazem  360 mg.  Review of systems complete and found to be negative unless listed in HPI.   EP Information / Studies Reviewed:    EKG is not ordered today. EKG from 12/28/23 reviewed which showed sinus rhythm with PR and QRS .     EKG 12/26/23: AFL   TEE 12/28/23:   1. Left ventricular ejection fraction, by estimation, is 55 to 60%. Left   ventricular ejection fraction by 3D volume is 58 %. The left ventricle has  normal function.   2. Right ventricular systolic function is normal. The right ventricular  size is normal.   3. Left atrial size was mild to moderately dilated. No left atrial/left  atrial appendage thrombus was detected. The LAA emptying velocity was 52  cm/s.   4. Right atrial size was mildly dilated.   5. The mitral valve is grossly normal. Trivial mitral valve  regurgitation. No evidence of mitral stenosis.   6. The aortic valve is tricuspid. Aortic valve regurgitation is mild. No  aortic stenosis is present.   7. There is mild (Grade II) plaque involving the descending aorta and  aortic arch.   8. 3D performed for the EF and 3D performed of the LAA and demonstrates  3D LVEF normal. 3D LAA without thrombus.   Echo 08/25/23:   1. Left ventricular ejection fraction, by estimation, is 55 to 60%. The  left ventricle has normal function. The left ventricle has no regional  wall motion abnormalities. There is mild concentric left ventricular  hypertrophy. Left ventricular diastolic  function could not be evaluated.   2. Right ventricular systolic function is mildly reduced. The right  ventricular size is normal.   3. The mitral valve is normal in structure. Trivial mitral valve  regurgitation. No evidence of mitral stenosis.   4. The aortic valve is tricuspid. There is mild calcification of the  aortic valve. Aortic valve regurgitation is not visualized. Aortic valve  sclerosis/calcification is present, without  any evidence of aortic  stenosis.   5. The inferior vena cava is normal in size with greater than 50%  respiratory variability, suggesting right atrial pressure of 3 mmHg.    Risk Assessment/Calculations:    CHA2DS2-VASc Score = 6   This indicates a 9.7% annual risk of stroke. The patient's score is based upon: CHF History: 1 HTN History: 1 Diabetes History: 0 Stroke History: 2 Vascular  Disease History: 1 Age Score: 1 Gender Score: 0             Physical Exam:   VS:  BP 130/78   Pulse 83   Ht 5\' 5"  (1.651 m)   SpO2 96% Comment: 2L  BMI 25.61 kg/m    Wt Readings from Last 3 Encounters:  12/25/23 153 lb 14.1 oz (69.8 kg)  11/26/23 158 lb 11.2 oz (72 kg)  08/26/23 161 lb 14.4 oz (73.4 kg)     GEN: Well nourished, well developed in no acute distress NECK: No JVD CARDIAC: Normal rate, regular RESPIRATORY:  Distant breath sounds ABDOMEN: Soft, non-distended EXTREMITIES:  No edema; No deformity   ASSESSMENT AND PLAN:    #. Paroxysmal atrial fibrillation, symptomatic: First diagnosed post CABG.  #. Typical atrial flutter:  -Discussed treatment options today for AF including antiarrhythmic drug therapy and ablation.  He is not a candidate for class Ic agents due to presence of coronary disease.  Would not recommend amiodarone  in the setting of his lung disease.  This would leave Tikosyn and sotalol as his best options.  Discussed risks, recovery and likelihood of success with each treatment strategy. Risk, benefits, and alternatives to EP study and ablation for afib were discussed. These risks include but are not limited to stroke, bleeding, vascular damage, tamponade, perforation, damage to the esophagus, lungs, phrenic nerve and other structures, pulmonary vein stenosis, worsening renal function, coronary vasospasm and death.  Discussed potential need for repeat ablation procedures and antiarrhythmic drugs after an initial ablation. The patient understands these risk and wishes to proceed.  We will therefore proceed with catheter ablation at the next available time.  Carto, ICE, anesthesia are requested for the procedure.  Will also obtain CT PV protocol prior to the procedure to exclude LAA thrombus and further evaluate atrial anatomy. - Continue diltiazem  360 mg once daily for now.  #. Secondary hypercoagulable state due to atrial fibrillation/flutter: CHADSVASC score  of 6.  - Continue warfarin. INR managed by Dr. Lyndal Sandy office.  #. Chronic diastolic heart failure: Well compensated today. - Continue spironolactone  12.5mg  daily.  - Continue lasix  60mg  daily.   #. CAD s/p CABG:  #. Hyperlipidemia:  - Continue Plavix  75mg  once daily.  - Continue atorvastatin  80 mg daily.  #. COPD: Uses 2L [place] home O2 at night.  - Continue inhalers and follow-up with pulmonology.  We will tentatively plan to keep him in the hospital overnight to monitor respiratory status after procedure.  Follow up with Dr. Daneil Dunker  3 months after ablation.   Signed, Ardeen Kohler, MD

## 2024-01-19 NOTE — H&P (View-Only) (Signed)
 Electrophysiology Office Note:   Date:  01/20/2024  ID:  Dennis Preston, DOB 1951-02-10, MRN [id number]  Primary Cardiologist: None Electrophysiologist: Ardeen Kohler, MD      History of Present Illness:   Dennis Preston is a 73 y.o. male with h/o accessible atrial fibrillation, obstructive sleep apnea, COPD, hypertension, carotid endarterectomy, PAD status post bifemoral bypass, CAD status post CABG x 4, chronic diastolic heart failure who is being seen today for evaluation for catheter ablation at the request of Dr. Beau Bound..   Discussed the use of AI scribe software for clinical note transcription with the patient, who gave verbal consent to proceed.  History of Present Illness He has a history of atrial fibrillation, first noted approximately eight years ago following cardiac bypass surgery. He experiences episodes of high heart rates, reaching up to 140 beats per minute, leading to hospital admissions, most recently 2/24 through 2/27. Symptoms during these episodes include shortness of breath and a sensation of his heart 'skipping and racing'. More recently, he has been experiencing atrial flutter, as noted on recent EKGs. These episodes often occur in the early morning hours, around 2-3 AM. He feels weak since a recent cardioversion, although his heart rate has not been as elevated since then.He has a history of COPD and uses oxygen at night, typically at a rate of two liters. He does not use oxygen during the day unless needed. He is currently on warfarin, with recent INR checks showing therapeutic levels between 2 and 3. He also takes diltiazem  360 mg.  Review of systems complete and found to be negative unless listed in HPI.   EP Information / Studies Reviewed:    EKG is not ordered today. EKG from 12/28/23 reviewed which showed sinus rhythm with PR and QRS .     EKG 12/26/23: AFL   TEE 12/28/23:   1. Left ventricular ejection fraction, by estimation, is 55 to 60%. Left   ventricular ejection fraction by 3D volume is 58 %. The left ventricle has  normal function.   2. Right ventricular systolic function is normal. The right ventricular  size is normal.   3. Left atrial size was mild to moderately dilated. No left atrial/left  atrial appendage thrombus was detected. The LAA emptying velocity was 52  cm/s.   4. Right atrial size was mildly dilated.   5. The mitral valve is grossly normal. Trivial mitral valve  regurgitation. No evidence of mitral stenosis.   6. The aortic valve is tricuspid. Aortic valve regurgitation is mild. No  aortic stenosis is present.   7. There is mild (Grade II) plaque involving the descending aorta and  aortic arch.   8. 3D performed for the EF and 3D performed of the LAA and demonstrates  3D LVEF normal. 3D LAA without thrombus.   Echo 08/25/23:   1. Left ventricular ejection fraction, by estimation, is 55 to 60%. The  left ventricle has normal function. The left ventricle has no regional  wall motion abnormalities. There is mild concentric left ventricular  hypertrophy. Left ventricular diastolic  function could not be evaluated.   2. Right ventricular systolic function is mildly reduced. The right  ventricular size is normal.   3. The mitral valve is normal in structure. Trivial mitral valve  regurgitation. No evidence of mitral stenosis.   4. The aortic valve is tricuspid. There is mild calcification of the  aortic valve. Aortic valve regurgitation is not visualized. Aortic valve  sclerosis/calcification is present, without  any evidence of aortic  stenosis.   5. The inferior vena cava is normal in size with greater than 50%  respiratory variability, suggesting right atrial pressure of 3 mmHg.    Risk Assessment/Calculations:    CHA2DS2-VASc Score = 6   This indicates a 9.7% annual risk of stroke. The patient's score is based upon: CHF History: 1 HTN History: 1 Diabetes History: 0 Stroke History: 2 Vascular  Disease History: 1 Age Score: 1 Gender Score: 0             Physical Exam:   VS:  BP 130/78   Pulse 83   Ht 5\' 5"  (1.651 m)   SpO2 96% Comment: 2L  BMI 25.61 kg/m    Wt Readings from Last 3 Encounters:  12/25/23 153 lb 14.1 oz (69.8 kg)  11/26/23 158 lb 11.2 oz (72 kg)  08/26/23 161 lb 14.4 oz (73.4 kg)     GEN: Well nourished, well developed in no acute distress NECK: No JVD CARDIAC: Normal rate, regular RESPIRATORY:  Distant breath sounds ABDOMEN: Soft, non-distended EXTREMITIES:  No edema; No deformity   ASSESSMENT AND PLAN:    #. Paroxysmal atrial fibrillation, symptomatic: First diagnosed post CABG.  #. Typical atrial flutter:  -Discussed treatment options today for AF including antiarrhythmic drug therapy and ablation.  He is not a candidate for class Ic agents due to presence of coronary disease.  Would not recommend amiodarone  in the setting of his lung disease.  This would leave Tikosyn and sotalol as his best options.  Discussed risks, recovery and likelihood of success with each treatment strategy. Risk, benefits, and alternatives to EP study and ablation for afib were discussed. These risks include but are not limited to stroke, bleeding, vascular damage, tamponade, perforation, damage to the esophagus, lungs, phrenic nerve and other structures, pulmonary vein stenosis, worsening renal function, coronary vasospasm and death.  Discussed potential need for repeat ablation procedures and antiarrhythmic drugs after an initial ablation. The patient understands these risk and wishes to proceed.  We will therefore proceed with catheter ablation at the next available time.  Carto, ICE, anesthesia are requested for the procedure.  Will also obtain CT PV protocol prior to the procedure to exclude LAA thrombus and further evaluate atrial anatomy. - Continue diltiazem  360 mg once daily for now.  #. Secondary hypercoagulable state due to atrial fibrillation/flutter: CHADSVASC score  of 6.  - Continue warfarin. INR managed by Dr. Lyndal Sandy office.  #. Chronic diastolic heart failure: Well compensated today. - Continue spironolactone  12.5mg  daily.  - Continue lasix  60mg  daily.   #. CAD s/p CABG:  #. Hyperlipidemia:  - Continue Plavix  75mg  once daily.  - Continue atorvastatin  80 mg daily.  #. COPD: Uses 2L [place] home O2 at night.  - Continue inhalers and follow-up with pulmonology.  We will tentatively plan to keep him in the hospital overnight to monitor respiratory status after procedure.  Follow up with Dr. Daneil Dunker  3 months after ablation.   Signed, Ardeen Kohler, MD

## 2024-01-19 NOTE — Patient Instructions (Addendum)
 Medication Instructions:  Your physician recommends that you continue on your current medications as directed. Please refer to the Current Medication list given to you today.  *If you need a refill on your cardiac medications before your next appointment, please call your pharmacy*  Lab Work: BMET and CBC - you may go to any [facility] location to have these drawn within 30 days of your procedure  Testing/Procedures: Cardiac CT Your physician has requested that you have cardiac CT. Cardiac computed tomography (CT) is a painless test that uses an x-ray machine to take clear, detailed pictures of your heart. For further information please visit [email or web address] Please follow instruction sheet as given. We will call you to schedule your CT scan. It will be done about three weeks prior to your ablation.  Ablation Your physician has recommended that you have an ablation. Catheter ablation is a medical procedure used to treat some cardiac arrhythmias (irregular heartbeats). During catheter ablation, a long, thin, flexible tube is put into a blood vessel in your groin (upper thigh), or neck. This tube is called an ablation catheter. It is then guided to your heart through the blood vessel. Radio frequency waves destroy small areas of heart tissue where abnormal heartbeats may cause an arrhythmia to start. Please see the instruction sheet given to you today.  Follow-Up: At [facility], you and your health needs are our priority.  As part of our continuing mission to provide you with exceptional heart care, we have created designated Provider Care Teams.  These Care Teams include your primary Cardiologist (physician) and Advanced Practice Providers (APPs -  Physician Assistants and Nurse Practitioners) who all work together to provide you with the care you need, when you need it.   Your next appointment:   We will contact you about your post-procedure follow up appointments.

## 2024-01-21 DIAGNOSIS — I480 Paroxysmal atrial fibrillation: Secondary | ICD-10-CM | POA: Diagnosis not present

## 2024-01-21 DIAGNOSIS — Z7901 Long term (current) use of anticoagulants: Secondary | ICD-10-CM | POA: Diagnosis not present

## 2024-02-01 ENCOUNTER — Other Ambulatory Visit
Admission: RE | Admit: 2024-02-01 | Discharge: 2024-02-01 | Disposition: A | Discharge status: Home / Self Care | Source: Ambulatory Visit | Attending: Cardiology | Admitting: Cardiology

## 2024-02-01 DIAGNOSIS — I480 Paroxysmal atrial fibrillation: Secondary | ICD-10-CM | POA: Diagnosis not present

## 2024-02-01 LAB — CBC
HCT: 48.3 % (ref 39.0–52.0)
Hemoglobin: 15.7 g/dL (ref 13.0–17.0)
MCH: 29.7 pg (ref 26.0–34.0)
MCHC: 32.5 g/dL (ref 30.0–36.0)
MCV: 91.3 fL (ref 80.0–100.0)
Platelets: 216 10*3/uL (ref 150–400)
RBC: 5.29 MIL/uL (ref 4.22–5.81)
RDW: 15.4 % (ref 11.5–15.5)
WBC: 8.5 10*3/uL (ref 4.0–10.5)
nRBC: 0 % (ref 0.0–0.2)

## 2024-02-01 LAB — BASIC METABOLIC PANEL WITH GFR
Anion gap: 12 (ref 5–15)
BUN: 18 mg/dL (ref 8–23)
CO2: 31 mmol/L (ref 22–32)
Calcium: 9 mg/dL (ref 8.9–10.3)
Chloride: 96 mmol/L — ABNORMAL LOW (ref 98–111)
Creatinine, Ser: 1.05 mg/dL (ref 0.61–1.24)
GFR, Estimated: >60 mL/min (ref >60)
Glucose, Bld: 133 mg/dL — ABNORMAL HIGH (ref 70–99)
Potassium: 3.6 mmol/L (ref 3.5–5.1)
Sodium: 139 mmol/L (ref 135–145)

## 2024-02-01 NOTE — Addendum Note (Signed)
 Addended by: Ara Bays C on: 02/01/2024 09:05 AM   Modules accepted: Orders

## 2024-02-04 ENCOUNTER — Ambulatory Visit
Admission: RE | Admit: 2024-02-04 | Discharge: 2024-02-04 | Disposition: A | Discharge status: Home / Self Care | Source: Ambulatory Visit | Attending: Cardiology | Admitting: Cardiology

## 2024-02-04 DIAGNOSIS — I480 Paroxysmal atrial fibrillation: Secondary | ICD-10-CM | POA: Diagnosis not present

## 2024-02-04 DIAGNOSIS — I700 Atherosclerosis of aorta: Secondary | ICD-10-CM | POA: Diagnosis not present

## 2024-02-04 MED ORDER — SODIUM CHLORIDE 0.9 % IV SOLN: INTRAVENOUS | Status: DC

## 2024-02-04 MED ORDER — IOHEXOL 350 MG/ML SOLN
75.0000 mL | Freq: Once | INTRAVENOUS | Status: AC | PRN
  Administered 2024-02-04: 75 mL via INTRAVENOUS

## 2024-02-11 ENCOUNTER — Telehealth (HOSPITAL_COMMUNITY): Payer: Self-pay

## 2024-02-11 DIAGNOSIS — Z7901 Long term (current) use of anticoagulants: Secondary | ICD-10-CM | POA: Diagnosis not present

## 2024-02-11 DIAGNOSIS — I480 Paroxysmal atrial fibrillation: Secondary | ICD-10-CM | POA: Diagnosis not present

## 2024-02-11 NOTE — Telephone Encounter (Signed)
 Spoke with patient to discuss upcoming procedure.   CT: completed.  Labs: completed.   Any recent signs of acute illness or been started on antibiotics? No Any new medications started? No Any medications to hold? No Any missed doses of blood thinner? No Advised patient to continue taking ANTICOAGULANT: Coumadin  (Warfarin) daily as directed without missing any doses.  Medication instructions:  On the morning of your procedure DO NOT take any medication., including Coumadin  or the procedure may be rescheduled. Nothing to eat or drink after midnight prior to your procedure.  Confirmed patient is scheduled for Atrial Fibrillation Ablation, Atrial Flutter Ablation on Thursday, May 22 with Dr. Clinton Danas. Instructed patient to arrive at the Main Entrance A at [facility]: [address] [place], [place] [address] and check in at Admitting at 8:00 AM.  Per Dr. Daneil Dunker, will tentatively plan to keep him in the hospital overnight to monitor respiratory status after procedure.   Patient verbalized understanding to all instructions provided and agreed to proceed with procedure.

## 2024-02-16 ENCOUNTER — Encounter (INDEPENDENT_AMBULATORY_CARE_PROVIDER_SITE_OTHER): Payer: Self-pay

## 2024-02-17 DIAGNOSIS — I480 Paroxysmal atrial fibrillation: Secondary | ICD-10-CM | POA: Diagnosis not present

## 2024-02-17 DIAGNOSIS — Z7901 Long term (current) use of anticoagulants: Secondary | ICD-10-CM | POA: Diagnosis not present

## 2024-02-17 NOTE — Pre-Procedure Instructions (Signed)
 Instructed Dennis Preston on the following items: Arrival time 0800 Nothing to eat or drink after midnight No meds AM of procedure Per Dennis Preston Dr Daneil Dunker plans to keep overnight to monitor  Have you missed any doses of anti-coagulant Coumadin - takes once a day, hasn't missed any doses in last 4 weeks.

## 2024-02-18 ENCOUNTER — Ambulatory Visit (HOSPITAL_BASED_OUTPATIENT_CLINIC_OR_DEPARTMENT_OTHER): Admitting: Anesthesiology

## 2024-02-18 ENCOUNTER — Ambulatory Visit (HOSPITAL_COMMUNITY): Admission: RE | Disposition: A | Discharge status: Home / Self Care | Source: Home / Self Care | Attending: Cardiology

## 2024-02-18 ENCOUNTER — Ambulatory Visit (HOSPITAL_COMMUNITY): Admitting: Anesthesiology

## 2024-02-18 ENCOUNTER — Ambulatory Visit (HOSPITAL_COMMUNITY)
Admission: RE | Admit: 2024-02-18 | Discharge: 2024-02-19 | Disposition: A | Discharge status: Home / Self Care | Attending: Cardiology | Admitting: Cardiology

## 2024-02-18 ENCOUNTER — Other Ambulatory Visit: Payer: Self-pay

## 2024-02-18 DIAGNOSIS — I480 Paroxysmal atrial fibrillation: Secondary | ICD-10-CM

## 2024-02-18 DIAGNOSIS — D6869 Other thrombophilia: Secondary | ICD-10-CM | POA: Diagnosis not present

## 2024-02-18 DIAGNOSIS — J449 Chronic obstructive pulmonary disease, unspecified: Secondary | ICD-10-CM

## 2024-02-18 DIAGNOSIS — I2510 Atherosclerotic heart disease of native coronary artery without angina pectoris: Secondary | ICD-10-CM | POA: Diagnosis not present

## 2024-02-18 DIAGNOSIS — Z9981 Dependence on supplemental oxygen: Secondary | ICD-10-CM | POA: Insufficient documentation

## 2024-02-18 DIAGNOSIS — I483 Typical atrial flutter: Secondary | ICD-10-CM | POA: Diagnosis not present

## 2024-02-18 DIAGNOSIS — Z7902 Long term (current) use of antithrombotics/antiplatelets: Secondary | ICD-10-CM | POA: Insufficient documentation

## 2024-02-18 DIAGNOSIS — I4891 Unspecified atrial fibrillation: Secondary | ICD-10-CM | POA: Diagnosis present

## 2024-02-18 DIAGNOSIS — I252 Old myocardial infarction: Secondary | ICD-10-CM | POA: Diagnosis not present

## 2024-02-18 DIAGNOSIS — Z951 Presence of aortocoronary bypass graft: Secondary | ICD-10-CM | POA: Diagnosis not present

## 2024-02-18 DIAGNOSIS — Z87891 Personal history of nicotine dependence: Secondary | ICD-10-CM | POA: Insufficient documentation

## 2024-02-18 DIAGNOSIS — I5032 Chronic diastolic (congestive) heart failure: Secondary | ICD-10-CM | POA: Diagnosis not present

## 2024-02-18 DIAGNOSIS — Z7901 Long term (current) use of anticoagulants: Secondary | ICD-10-CM | POA: Insufficient documentation

## 2024-02-18 DIAGNOSIS — E785 Hyperlipidemia, unspecified: Secondary | ICD-10-CM

## 2024-02-18 DIAGNOSIS — I739 Peripheral vascular disease, unspecified: Secondary | ICD-10-CM | POA: Insufficient documentation

## 2024-02-18 DIAGNOSIS — Z79899 Other long term (current) drug therapy: Secondary | ICD-10-CM | POA: Insufficient documentation

## 2024-02-18 DIAGNOSIS — Z8673 Personal history of transient ischemic attack (TIA), and cerebral infarction without residual deficits: Secondary | ICD-10-CM | POA: Diagnosis not present

## 2024-02-18 DIAGNOSIS — I110 Hypertensive heart disease with heart failure: Secondary | ICD-10-CM | POA: Diagnosis not present

## 2024-02-18 DIAGNOSIS — Z95828 Presence of other vascular implants and grafts: Secondary | ICD-10-CM | POA: Diagnosis not present

## 2024-02-18 DIAGNOSIS — I4819 Other persistent atrial fibrillation: Secondary | ICD-10-CM | POA: Diagnosis not present

## 2024-02-18 HISTORY — PX: ATRIAL FIBRILLATION ABLATION: EP1191

## 2024-02-18 HISTORY — PX: A-FLUTTER ABLATION: EP1230

## 2024-02-18 LAB — PROTIME-INR
INR: 2.2 — ABNORMAL HIGH (ref 0.8–1.2)
Prothrombin Time: 24.7 s — ABNORMAL HIGH (ref 11.4–15.2)

## 2024-02-18 MED ORDER — DEXAMETHASONE SODIUM PHOSPHATE 10 MG/ML IJ SOLN
INTRAMUSCULAR | Status: DC | PRN
  Administered 2024-02-18: 5 mg via INTRAVENOUS

## 2024-02-18 MED ORDER — HEPARIN SODIUM (PORCINE) 1000 UNIT/ML IJ SOLN
INTRAMUSCULAR | Status: DC | PRN
  Administered 2024-02-18: 1000 [IU] via INTRAVENOUS

## 2024-02-18 MED ORDER — BUDESON-GLYCOPYRROL-FORMOTEROL 160-9-4.8 MCG/ACT IN AERO
2.0000 | INHALATION_SPRAY | Freq: Two times a day (BID) | RESPIRATORY_TRACT | Status: DC
  Administered 2024-02-18 – 2024-02-19 (×2): 2 via RESPIRATORY_TRACT
  Filled 2024-02-18: qty 5.9

## 2024-02-18 MED ORDER — ONDANSETRON HCL 4 MG/2ML IJ SOLN: 4.0000 mg | Freq: Four times a day (QID) | INTRAMUSCULAR | Status: DC | PRN

## 2024-02-18 MED ORDER — PHENYLEPHRINE HCL-NACL 20-0.9 MG/250ML-% IV SOLN
INTRAVENOUS | Status: DC | PRN
  Administered 2024-02-18: 20 ug/min via INTRAVENOUS

## 2024-02-18 MED ORDER — SODIUM CHLORIDE 0.9% FLUSH
3.0000 mL | Freq: Two times a day (BID) | INTRAVENOUS | Status: DC
  Administered 2024-02-18: 3 mL via INTRAVENOUS

## 2024-02-18 MED ORDER — LIDOCAINE 2% (20 MG/ML) 5 ML SYRINGE
INTRAMUSCULAR | Status: DC | PRN
  Administered 2024-02-18: 60 mg via INTRAVENOUS

## 2024-02-18 MED ORDER — HEPARIN SODIUM (PORCINE) 1000 UNIT/ML IJ SOLN
INTRAMUSCULAR | Status: DC | PRN
  Administered 2024-02-18: 9000 [IU] via INTRAVENOUS

## 2024-02-18 MED ORDER — FENTANYL CITRATE (PF) 250 MCG/5ML IJ SOLN
INTRAMUSCULAR | Status: DC | PRN
  Administered 2024-02-18 (×2): 25 ug via INTRAVENOUS
  Administered 2024-02-18: 50 ug via INTRAVENOUS

## 2024-02-18 MED ORDER — WARFARIN SODIUM 1 MG PO TABS: 1.5000 mg | ORAL_TABLET | ORAL | Status: DC

## 2024-02-18 MED ORDER — ATROPINE SULFATE 1 MG/ML IV SOLN
INTRAVENOUS | Status: DC | PRN
  Administered 2024-02-18: 1 mg via INTRAVENOUS

## 2024-02-18 MED ORDER — SODIUM CHLORIDE 0.9% FLUSH: 3.0000 mL | INTRAVENOUS | Status: DC | PRN

## 2024-02-18 MED ORDER — ACETAMINOPHEN 500 MG PO TABS
1000.0000 mg | ORAL_TABLET | Freq: Once | ORAL | Status: AC
  Administered 2024-02-18: 1000 mg via ORAL
  Filled 2024-02-18: qty 2

## 2024-02-18 MED ORDER — PROPOFOL 10 MG/ML IV BOLUS
INTRAVENOUS | Status: DC | PRN
  Administered 2024-02-18: 150 mg via INTRAVENOUS

## 2024-02-18 MED ORDER — ALBUTEROL SULFATE (2.5 MG/3ML) 0.083% IN NEBU: 2.5000 mg | INHALATION_SOLUTION | RESPIRATORY_TRACT | Status: DC | PRN

## 2024-02-18 MED ORDER — DIAZEPAM 5 MG PO TABS
5.0000 mg | ORAL_TABLET | Freq: Every day | ORAL | Status: DC
  Administered 2024-02-18: 5 mg via ORAL
  Filled 2024-02-18: qty 1

## 2024-02-18 MED ORDER — ATORVASTATIN CALCIUM 80 MG PO TABS
80.0000 mg | ORAL_TABLET | Freq: Every day | ORAL | Status: DC
Start: 2024-02-18 — End: 2024-02-19
  Administered 2024-02-18: 80 mg via ORAL
  Filled 2024-02-18: qty 1

## 2024-02-18 MED ORDER — FENTANYL CITRATE (PF) 100 MCG/2ML IJ SOLN
INTRAMUSCULAR | Status: AC
  Filled 2024-02-18: qty 2

## 2024-02-18 MED ORDER — EPHEDRINE SULFATE-NACL 50-0.9 MG/10ML-% IV SOSY
PREFILLED_SYRINGE | INTRAVENOUS | Status: DC | PRN
  Administered 2024-02-18: 5 mg via INTRAVENOUS

## 2024-02-18 MED ORDER — HEPARIN (PORCINE) IN NACL 1000-0.9 UT/500ML-% IV SOLN
INTRAVENOUS | Status: DC | PRN
  Administered 2024-02-18 (×3): 500 mL

## 2024-02-18 MED ORDER — SODIUM CHLORIDE 0.9 % IV SOLN: 250.0000 mL | INTRAVENOUS | Status: DC | PRN

## 2024-02-18 MED ORDER — WARFARIN SODIUM 2 MG PO TABS
2.0000 mg | ORAL_TABLET | ORAL | Status: DC
  Administered 2024-02-18: 2 mg via ORAL
  Filled 2024-02-18: qty 1

## 2024-02-18 MED ORDER — PROTAMINE SULFATE 10 MG/ML IV SOLN
INTRAVENOUS | Status: DC | PRN
  Administered 2024-02-18: 10 mg via INTRAVENOUS
  Administered 2024-02-18: 25 mg via INTRAVENOUS

## 2024-02-18 MED ORDER — WARFARIN SODIUM 2 MG PO TABS
2.0000 mg | ORAL_TABLET | Freq: Every day | ORAL | Status: DC
Start: 2024-02-18 — End: 2024-02-18

## 2024-02-18 MED ORDER — ROCURONIUM BROMIDE 10 MG/ML (PF) SYRINGE
PREFILLED_SYRINGE | INTRAVENOUS | Status: DC | PRN
  Administered 2024-02-18: 10 mg via INTRAVENOUS
  Administered 2024-02-18: 50 mg via INTRAVENOUS
  Administered 2024-02-18: 10 mg via INTRAVENOUS

## 2024-02-18 MED ORDER — ONDANSETRON HCL 4 MG/2ML IJ SOLN
INTRAMUSCULAR | Status: DC | PRN
  Administered 2024-02-18: 4 mg via INTRAVENOUS

## 2024-02-18 MED ORDER — SPIRONOLACTONE 12.5 MG HALF TABLET
12.5000 mg | ORAL_TABLET | Freq: Every morning | ORAL | Status: DC
  Administered 2024-02-19: 12.5 mg via ORAL
  Filled 2024-02-18: qty 1

## 2024-02-18 MED ORDER — SUGAMMADEX SODIUM 200 MG/2ML IV SOLN
INTRAVENOUS | Status: DC | PRN
  Administered 2024-02-18: 308 mg via INTRAVENOUS

## 2024-02-18 MED ORDER — ACETAMINOPHEN 325 MG PO TABS: 650.0000 mg | ORAL_TABLET | ORAL | Status: DC | PRN

## 2024-02-18 MED ORDER — PHENYLEPHRINE 80 MCG/ML (10ML) SYRINGE FOR IV PUSH (FOR BLOOD PRESSURE SUPPORT)
PREFILLED_SYRINGE | INTRAVENOUS | Status: DC | PRN
  Administered 2024-02-18: 160 ug via INTRAVENOUS

## 2024-02-18 MED ORDER — HEPARIN SODIUM (PORCINE) 1000 UNIT/ML IJ SOLN
INTRAMUSCULAR | Status: AC
Start: 2024-02-18 — End: ?
  Filled 2024-02-18: qty 10

## 2024-02-18 MED ORDER — CLOPIDOGREL BISULFATE 75 MG PO TABS
75.0000 mg | ORAL_TABLET | Freq: Every morning | ORAL | Status: DC
  Administered 2024-02-19: 75 mg via ORAL
  Filled 2024-02-18: qty 1

## 2024-02-18 MED ORDER — FUROSEMIDE 40 MG PO TABS
60.0000 mg | ORAL_TABLET | Freq: Every day | ORAL | Status: DC
  Administered 2024-02-19: 60 mg via ORAL
  Filled 2024-02-18: qty 1

## 2024-02-18 MED ORDER — DILTIAZEM HCL ER COATED BEADS 180 MG PO CP24
180.0000 mg | ORAL_CAPSULE | Freq: Every morning | ORAL | Status: DC
  Administered 2024-02-19: 180 mg via ORAL
  Filled 2024-02-18: qty 1

## 2024-02-18 MED ORDER — WARFARIN - PHYSICIAN DOSING INPATIENT
Freq: Every day | Status: DC
Start: 2024-02-19 — End: 2024-02-19

## 2024-02-18 MED ORDER — MELATONIN 3 MG PO TABS
3.0000 mg | ORAL_TABLET | Freq: Every day | ORAL | Status: DC
  Filled 2024-02-18: qty 1

## 2024-02-18 MED ORDER — SODIUM CHLORIDE 0.9 % IV SOLN: INTRAVENOUS | Status: DC

## 2024-02-18 NOTE — Progress Notes (Signed)
 Report given to Ludwig Safer RN. Transporting pt for overnight stay and discharge in the morning s/p successful afib/flutter ablation.

## 2024-02-18 NOTE — Plan of Care (Signed)
  Problem: Education: Goal: Knowledge of General Education information will improve Description: Including pain rating scale, medication(s)/side effects and non-pharmacologic comfort measures Outcome: Adequate for Discharge   Problem: Health Behavior/Discharge Planning: Goal: Ability to manage health-related needs will improve Outcome: Adequate for Discharge   Problem: Clinical Measurements: Goal: Ability to maintain clinical measurements within normal limits will improve Outcome: Adequate for Discharge Goal: Will remain free from infection Outcome: Adequate for Discharge Goal: Diagnostic test results will improve Outcome: Adequate for Discharge Goal: Respiratory complications will improve Outcome: Adequate for Discharge Goal: Cardiovascular complication will be avoided Outcome: Adequate for Discharge   Problem: Activity: Goal: Risk for activity intolerance will decrease Outcome: Adequate for Discharge   Problem: Nutrition: Goal: Adequate nutrition will be maintained Outcome: Adequate for Discharge   Problem: Coping: Goal: Level of anxiety will decrease Outcome: Adequate for Discharge   Problem: Elimination: Goal: Will not experience complications related to bowel motility Outcome: Adequate for Discharge Goal: Will not experience complications related to urinary retention Outcome: Adequate for Discharge   Problem: Pain Managment: Goal: General experience of comfort will improve and/or be controlled Outcome: Adequate for Discharge   Problem: Safety: Goal: Ability to remain free from injury will improve Outcome: Adequate for Discharge   Problem: Skin Integrity: Goal: Risk for impaired skin integrity will decrease Outcome: Adequate for Discharge   Problem: Education: Goal: Understanding of disease, treatment, and recovery process will improve Outcome: Adequate for Discharge   Problem: Activity: Goal: Ability to return to baseline activity level will  improve Outcome: Adequate for Discharge   Problem: Cardiac: Goal: Ability to maintain adequate cardiovascular perfusion will improve Outcome: Adequate for Discharge Goal: Vascular access site(s) Level 0-1 will be maintained Outcome: Adequate for Discharge   Problem: Health Behavior/ Discharge Planning: Goal: Ability to safely manage health related needs after discharge Outcome: Adequate for Discharge

## 2024-02-18 NOTE — Transfer of Care (Signed)
 Immediate Anesthesia Transfer of Care Note  Patient: Dennis Preston  Procedure(s) Performed: ATRIAL FIBRILLATION ABLATION A-FLUTTER ABLATION  Patient Location: PACU and Cath Lab  Anesthesia Type:General  Level of Consciousness: drowsy, patient cooperative, and responds to stimulation  Airway & Oxygen Therapy: Patient Spontanous Breathing and Patient connected to nasal cannula oxygen  Post-op Assessment: Report given to RN and Post -op Vital signs reviewed and stable  Post vital signs: Reviewed and stable  Last Vitals:  Vitals Value Taken Time  BP 127/61 02/18/24 1225  Temp 36.4 C 02/18/24 1223  Pulse 63 02/18/24 1229  Resp 17 02/18/24 1229  SpO2 96 % 02/18/24 1229  Vitals shown include unfiled device data.  Last Pain:  Vitals:   02/18/24 1223  TempSrc: Tympanic  PainSc: 0-No pain         Complications: There were no known notable events for this encounter.

## 2024-02-18 NOTE — Anesthesia Procedure Notes (Signed)
 Procedure Name: Intubation Date/Time: 02/18/2024 10:04 AM  Performed by: Merna Aase, CRNAPre-anesthesia Checklist: Patient identified, Patient being monitored, Timeout performed, Emergency Drugs available and Suction available Patient Re-evaluated:Patient Re-evaluated prior to induction Oxygen Delivery Method: Circle system utilized Preoxygenation: Pre-oxygenation with 100% oxygen Induction Type: IV induction Ventilation: Mask ventilation without difficulty Laryngoscope Size: Mac and 4 Grade View: Grade I Tube type: Oral Tube size: 7.0 mm Number of attempts: 1 Airway Equipment and Method: Stylet Placement Confirmation: ETT inserted through vocal cords under direct vision, positive ETCO2 and breath sounds checked- equal and bilateral Secured at: 22 cm Tube secured with: Tape Dental Injury: Teeth and Oropharynx as per pre-operative assessment

## 2024-02-18 NOTE — Anesthesia Preprocedure Evaluation (Addendum)
 Anesthesia Evaluation  Patient identified by MRN, date of birth, ID band Patient awake    Reviewed: Allergy & Precautions, NPO status , Patient's Chart, lab work & pertinent test results  Airway Mallampati: II  TM Distance: >3 FB Neck ROM: Full    Dental  (+) Dental Advisory Given, Lower Dentures, Upper Dentures   Pulmonary COPD (2L [place]),  COPD inhaler and oxygen dependent, former smoker   Pulmonary exam normal breath sounds clear to auscultation       Cardiovascular hypertension, Pt. on medications (-) angina + CAD, + Past MI, + CABG, + Peripheral Vascular Disease and +CHF  + dysrhythmias Atrial Fibrillation  Rhythm:Irregular Rate:Abnormal  Echo 12/28/23: 1. Left ventricular ejection fraction, by estimation, is 55 to 60%. Left  ventricular ejection fraction by 3D volume is 58 %. The left ventricle has  normal function.   2. Right ventricular systolic function is normal. The right ventricular  size is normal.   3. Left atrial size was mild to moderately dilated. No left atrial/left  atrial appendage thrombus was detected. The LAA emptying velocity was 52  cm/s.   4. Right atrial size was mildly dilated.   5. The mitral valve is grossly normal. Trivial mitral valve  regurgitation. No evidence of mitral stenosis.   6. The aortic valve is tricuspid. Aortic valve regurgitation is mild. No  aortic stenosis is present.   7. There is mild (Grade II) plaque involving the descending aorta and  aortic arch.   8. 3D performed for the EF and 3D performed of the LAA and demonstrates  3D LVEF normal. 3D LAA without thrombus.     Neuro/Psych  PSYCHIATRIC DISORDERS Anxiety Depression    negative neurological ROS     GI/Hepatic negative GI ROS,,,(+)     substance abuse  alcohol use  Endo/Other  negative endocrine ROS    Renal/GU Renal disease     Musculoskeletal negative musculoskeletal ROS (+)    Abdominal   Peds   Hematology  (+) Blood dyscrasia (Plavix ; Coumadin )   Anesthesia Other Findings Day of surgery medications reviewed with the patient.  Reproductive/Obstetrics                             Anesthesia Physical Anesthesia Plan  ASA: 4  Anesthesia Plan: General   Post-op Pain Management: Tylenol  PO (pre-op)*   Induction: Intravenous  PONV Risk Score and Plan: 2 and Dexamethasone  and Ondansetron   Airway Management Planned: Oral ETT  Additional Equipment:   Intra-op Plan:   Post-operative Plan: Extubation in OR  Informed Consent: I have reviewed the patients History and Physical, chart, labs and discussed the procedure including the risks, benefits and alternatives for the proposed anesthesia with the patient or authorized representative who has indicated his/her understanding and acceptance.     Dental advisory given  Plan Discussed with: CRNA  Anesthesia Plan Comments:         Anesthesia Quick Evaluation

## 2024-02-18 NOTE — Interval H&P Note (Signed)
 History and Physical Interval Note:  02/18/2024 9:37 AM  Dennis Preston  has presented today for surgery, with the diagnosis of atrial fibrillation and atrial flutter.  The various methods of treatment have been discussed with the patient and family. After consideration of risks, benefits and other options for treatment, the patient has consented to  Procedure(s): ATRIAL FIBRILLATION ABLATION (N/A) A-FLUTTER ABLATION (N/A) as a surgical intervention.  The patient's history has been reviewed, patient examined, no change in status, stable for surgery.  I have reviewed the patient's chart and labs.  Questions were answered to the patient's satisfaction.     Ardeen Kohler

## 2024-02-18 NOTE — Anesthesia Postprocedure Evaluation (Signed)
 Anesthesia Post Note  Patient: Dennis Preston  Procedure(s) Performed: ATRIAL FIBRILLATION ABLATION A-FLUTTER ABLATION     Patient location during evaluation: Cath Lab Anesthesia Type: General Level of consciousness: awake and alert Pain management: pain level controlled Vital Signs Assessment: post-procedure vital signs reviewed and stable Respiratory status: spontaneous breathing, nonlabored ventilation and respiratory function stable Cardiovascular status: blood pressure returned to baseline and stable Postop Assessment: no apparent nausea or vomiting Anesthetic complications: no   There were no known notable events for this encounter.  Last Vitals:  Vitals:   02/18/24 1400 02/18/24 1534  BP: 110/61 129/69  Pulse: (!) 58 62  Resp: 18 16  Temp:  (!) 36.4 C  SpO2: 94% 97%    Last Pain:  Vitals:   02/18/24 1534  TempSrc: Oral  PainSc: 0-No pain   Pain Goal:                   Erin Havers

## 2024-02-19 ENCOUNTER — Telehealth: Payer: Self-pay | Admitting: Cardiology

## 2024-02-19 ENCOUNTER — Encounter (HOSPITAL_COMMUNITY): Payer: Self-pay | Admitting: Cardiology

## 2024-02-19 DIAGNOSIS — Z7901 Long term (current) use of anticoagulants: Secondary | ICD-10-CM | POA: Diagnosis not present

## 2024-02-19 DIAGNOSIS — I5032 Chronic diastolic (congestive) heart failure: Secondary | ICD-10-CM | POA: Diagnosis not present

## 2024-02-19 DIAGNOSIS — D6869 Other thrombophilia: Secondary | ICD-10-CM | POA: Diagnosis not present

## 2024-02-19 DIAGNOSIS — I4819 Other persistent atrial fibrillation: Secondary | ICD-10-CM | POA: Diagnosis not present

## 2024-02-19 DIAGNOSIS — Z9981 Dependence on supplemental oxygen: Secondary | ICD-10-CM | POA: Diagnosis not present

## 2024-02-19 DIAGNOSIS — I480 Paroxysmal atrial fibrillation: Secondary | ICD-10-CM | POA: Diagnosis not present

## 2024-02-19 DIAGNOSIS — I483 Typical atrial flutter: Secondary | ICD-10-CM | POA: Diagnosis not present

## 2024-02-19 DIAGNOSIS — J449 Chronic obstructive pulmonary disease, unspecified: Secondary | ICD-10-CM | POA: Diagnosis not present

## 2024-02-19 DIAGNOSIS — E785 Hyperlipidemia, unspecified: Secondary | ICD-10-CM | POA: Diagnosis not present

## 2024-02-19 DIAGNOSIS — I110 Hypertensive heart disease with heart failure: Secondary | ICD-10-CM | POA: Diagnosis not present

## 2024-02-19 MED ORDER — ACETAMINOPHEN 325 MG PO TABS: 650.0000 mg | ORAL_TABLET | ORAL | Status: AC | PRN

## 2024-02-19 MED ORDER — DILTIAZEM HCL ER COATED BEADS 180 MG PO CP24: 180.0000 mg | ORAL_CAPSULE | Freq: Every morning | ORAL | 6 refills | Status: AC

## 2024-02-19 MED FILL — Fentanyl Citrate Preservative Free (PF) Inj 100 MCG/2ML: INTRAMUSCULAR | Qty: 2 | Status: AC

## 2024-02-19 NOTE — Telephone Encounter (Signed)
 Pt c/o medication issue:  1. Name of Medication:     diltiazem  (CARDIZEM  CD) 180 MG 24 hr capsule      2. How are you currently taking this medication (dosage and times per day)? As written   3. Are you having a reaction (difficulty breathing--STAT)? No   4. What is your medication issue? [facility] pharmacy called in stating they received this medication from [place], [place] today and they want to clarify if they need to discontinue the previous dose he was on. Please advise.

## 2024-02-19 NOTE — Plan of Care (Signed)
  Problem: Education: Goal: Knowledge of General Education information will improve Description: Including pain rating scale, medication(s)/side effects and non-pharmacologic comfort measures Outcome: Progressing   Problem: Health Behavior/Discharge Planning: Goal: Ability to manage health-related needs will improve Outcome: Progressing   Problem: Clinical Measurements: Goal: Ability to maintain clinical measurements within normal limits will improve Outcome: Progressing Goal: Will remain free from infection Outcome: Progressing Goal: Diagnostic test results will improve Outcome: Progressing Goal: Respiratory complications will improve Outcome: Progressing Goal: Cardiovascular complication will be avoided Outcome: Progressing   Problem: Activity: Goal: Risk for activity intolerance will decrease Outcome: Progressing   Problem: Nutrition: Goal: Adequate nutrition will be maintained Outcome: Progressing   Problem: Coping: Goal: Level of anxiety will decrease Outcome: Progressing   Problem: Elimination: Goal: Will not experience complications related to bowel motility Outcome: Progressing Goal: Will not experience complications related to urinary retention Outcome: Progressing   Problem: Pain Managment: Goal: General experience of comfort will improve and/or be controlled Outcome: Progressing   Problem: Safety: Goal: Ability to remain free from injury will improve Outcome: Progressing   Problem: Skin Integrity: Goal: Risk for impaired skin integrity will decrease Outcome: Progressing   Problem: Education: Goal: Understanding of disease, treatment, and recovery process will improve Outcome: Progressing   Problem: Activity: Goal: Ability to return to baseline activity level will improve Outcome: Progressing   Problem: Cardiac: Goal: Ability to maintain adequate cardiovascular perfusion will improve Outcome: Progressing Goal: Vascular access site(s) Level 0-1  will be maintained Outcome: Progressing   Problem: Health Behavior/ Discharge Planning: Goal: Ability to safely manage health related needs after discharge Outcome: Progressing

## 2024-02-19 NOTE — Discharge Instructions (Signed)

## 2024-02-19 NOTE — Discharge Summary (Addendum)
 ELECTROPHYSIOLOGY PROCEDURE DISCHARGE SUMMARY    Patient ID: Dennis Preston,  MRN: [id number], DOB/AGE: [birth year] 73 y.o.  Admit date: 02/18/2024 Discharge date: 02/19/2024  Primary Care Physician: Antonio Baumgarten, MD  Primary Cardiologist: None  Electrophysiologist: Dr. Daneil Dunker   Primary Discharge Diagnosis:  Atrial Fibrillation  Secondary Discharge Diagnosis:  Atrial Flutter  Procedures This Admission:  Electrophysiology study and Pulse Field catheter ablation of Atrial Fibrillation on 02/18/2024 by Dr. Daneil Dunker .  This study demonstrated  1. Successful PVI. 2. Successful ablation/isolation of the posterior wall. 3. Successful ablation of the cavotricuspid isthmus with bidirectional block achieved. 3. Intracardiac echo reveals normal LV size and function with trivial pericardial effusion. 4. No early apparent complications. 5. Resume warfarin this evening. .        Brief HPI: Dennis Preston is a 73 y.o. male with a history of Atrial Fibrillation. Risks, benefits, and alternatives to catheter ablation of Atrial Fibrillation were reviewed with the patient who wished to proceed.   The patient has been on uninterrupted anticoagulation for more than 3 weeks and did not require TEE.  Hospital Course:  The patient was admitted and underwent EPS/RFCA of Atrial Fibrillation with details as outlined above.  They were monitored on telemetry overnight which demonstrated NSR.  Groin was without complication on the day of discharge.  The patient was examined and considered to be stable for discharge.  Wound care and restrictions were reviewed with the patient.  The patient will be seen back by EP APP in 4 weeks.  Physical Exam: Vitals:   02/18/24 2109 02/19/24 0030 02/19/24 0200 02/19/24 0449  BP:  (!) 132/55 119/64 (!) 116/48  Pulse: 86 60 68   Resp: 18  18   Temp:  97.8 F (36.6 C)  97.6 F (36.4 C)  TempSrc:  Oral  Oral  SpO2:   96%     GEN- NAD. A&O x 3.  HEENT:  Normocephalic, atraumatic Lungs- CTAB, Normal effort.  Heart- RRR, No M/G/R.  GI- Soft, NT, ND.  Extremities- No clubbing, cyanosis, or edema;  Skin- warm and dry, no rash or lesion, left chest without hematoma/ecchymosis  Discharge Medications:  Allergies as of 02/19/2024       Reactions   Aspirin  Swelling   REACTION: reaction not known        Medication List     TAKE these medications    acetaminophen  325 MG tablet Commonly known as: TYLENOL  Take 2 tablets (650 mg total) by mouth every 4 (four) hours as needed for headache or mild pain (pain score 1-3).   atorvastatin  80 MG tablet Commonly known as: LIPITOR  Take 1 tablet (80 mg total) by mouth daily at 6 PM. What changed: when to take this   clopidogrel  75 MG tablet Commonly known as: PLAVIX  Take 75 mg by mouth in the morning.   colchicine 0.6 MG tablet Take 0.6 mg by mouth daily as needed (gout flare).   diazepam  5 MG tablet Commonly known as: VALIUM  Take 1 tablet (5 mg total) by mouth at bedtime.   diltiazem  180 MG 24 hr capsule Commonly known as: CARDIZEM  CD Take 1 capsule (180 mg total) by mouth in the morning. What changed:  medication strength how much to take   furosemide  40 MG tablet Commonly known as: LASIX  Take 40 mg in the morning and 20 mg (1/2 tablet) in the evening What changed:  how much to take how to take this when to take this additional instructions  levalbuterol  1.25 MG/3ML nebulizer solution Commonly known as: XOPENEX  Take 1.25 mg by nebulization every 6 (six) hours as needed for wheezing or shortness of breath.   OXYGEN Inhale 2 L into the lungs at bedtime. Uses while sleeping   spironolactone  25 MG tablet Commonly known as: ALDACTONE  Take 0.5 tablets (12.5 mg total) by mouth daily. What changed: when to take this   Trelegy Ellipta 100-62.5-25 MCG/INH Aepb Generic drug: Fluticasone -Umeclidin-Vilant Inhale 1 puff into the lungs in the morning.   warfarin 2 MG  tablet Commonly known as: COUMADIN  Take 2 mg by mouth See admin instructions. Take 1 tablet (2mg ) by mouth at bedtime Monday - Friday.   warfarin 1 MG tablet Commonly known as: COUMADIN  Take 1.5 mg by mouth See admin instructions. Take 1.5 tablets (1.5mg ) by mouth at bedtime on Saturday and Sunday        Disposition: Home with usual follow up as in AVS  Signed, Tylene Galla, PA-C  02/19/2024 7:28 AM   I have seen, examined the patient, and reviewed the above assessment and plan.    Hospital Course: Patient presented for scheduled catheter ablation.  He underwent successful pulmonary vein and posterior wall isolation on 02/18/2024.  He was kept overnight for routine monitoring.  He did well overnight with no acute complications.  This morning, he reports feeling relatively well with no new or acute complaints today.  General: Well developed, in no acute distress.  Neck: No JVD.  Cardiac: Bradycardic, regular rhythm.  Resp: Normal work of breathing.  Groins: Soft bilaterally without bleeding or hematoma Ext: No edema.  Neuro: No gross focal deficits.  Psych: Normal affect.   Plan:  - Continue warfarin with no interruptions for next 90 days.  He has established relationship with Coumadin  clinic through [facility] in [place]. - Decrease diltiazem  to 280 mg once daily.  Duration of Discharge Encounter: 45 minutes  Ardeen Kohler, MD 02/19/2024 10:58 PM

## 2024-02-19 NOTE — Telephone Encounter (Signed)
 Spoke with Normand Beckwith at [facility] [place] and confirmed dose of diltiazem  is 180 mg 24 hr. She verbalized understanding

## 2024-02-25 ENCOUNTER — Encounter: Payer: Self-pay | Admitting: Emergency Medicine

## 2024-03-03 DIAGNOSIS — Z7901 Long term (current) use of anticoagulants: Secondary | ICD-10-CM | POA: Diagnosis not present

## 2024-03-03 DIAGNOSIS — I480 Paroxysmal atrial fibrillation: Secondary | ICD-10-CM | POA: Diagnosis not present

## 2024-03-09 DIAGNOSIS — I4891 Unspecified atrial fibrillation: Secondary | ICD-10-CM | POA: Diagnosis not present

## 2024-03-09 DIAGNOSIS — E7800 Pure hypercholesterolemia, unspecified: Secondary | ICD-10-CM | POA: Diagnosis not present

## 2024-03-09 DIAGNOSIS — I739 Peripheral vascular disease, unspecified: Secondary | ICD-10-CM | POA: Diagnosis not present

## 2024-03-09 DIAGNOSIS — Z951 Presence of aortocoronary bypass graft: Secondary | ICD-10-CM | POA: Diagnosis not present

## 2024-03-09 DIAGNOSIS — J449 Chronic obstructive pulmonary disease, unspecified: Secondary | ICD-10-CM | POA: Diagnosis not present

## 2024-03-09 DIAGNOSIS — D649 Anemia, unspecified: Secondary | ICD-10-CM | POA: Diagnosis not present

## 2024-03-09 DIAGNOSIS — J9611 Chronic respiratory failure with hypoxia: Secondary | ICD-10-CM | POA: Diagnosis not present

## 2024-03-09 DIAGNOSIS — I10 Essential (primary) hypertension: Secondary | ICD-10-CM | POA: Diagnosis not present

## 2024-03-09 DIAGNOSIS — Z1159 Encounter for screening for other viral diseases: Secondary | ICD-10-CM | POA: Diagnosis not present

## 2024-03-09 DIAGNOSIS — R7309 Other abnormal glucose: Secondary | ICD-10-CM | POA: Diagnosis not present

## 2024-03-11 ENCOUNTER — Emergency Department

## 2024-03-11 ENCOUNTER — Emergency Department
Admission: EM | Admit: 2024-03-11 | Discharge: 2024-03-11 | Disposition: A | Discharge status: Home / Self Care | Attending: Emergency Medicine | Admitting: Emergency Medicine

## 2024-03-11 ENCOUNTER — Telehealth: Payer: Self-pay | Admitting: Cardiology

## 2024-03-11 ENCOUNTER — Other Ambulatory Visit: Payer: Self-pay

## 2024-03-11 DIAGNOSIS — R55 Syncope and collapse: Secondary | ICD-10-CM | POA: Diagnosis not present

## 2024-03-11 DIAGNOSIS — J449 Chronic obstructive pulmonary disease, unspecified: Secondary | ICD-10-CM | POA: Insufficient documentation

## 2024-03-11 DIAGNOSIS — K449 Diaphragmatic hernia without obstruction or gangrene: Secondary | ICD-10-CM | POA: Diagnosis not present

## 2024-03-11 DIAGNOSIS — Z951 Presence of aortocoronary bypass graft: Secondary | ICD-10-CM | POA: Diagnosis not present

## 2024-03-11 DIAGNOSIS — I2510 Atherosclerotic heart disease of native coronary artery without angina pectoris: Secondary | ICD-10-CM | POA: Diagnosis not present

## 2024-03-11 DIAGNOSIS — Z7901 Long term (current) use of anticoagulants: Secondary | ICD-10-CM | POA: Diagnosis not present

## 2024-03-11 DIAGNOSIS — R079 Chest pain, unspecified: Secondary | ICD-10-CM | POA: Diagnosis not present

## 2024-03-11 DIAGNOSIS — I509 Heart failure, unspecified: Secondary | ICD-10-CM | POA: Diagnosis not present

## 2024-03-11 DIAGNOSIS — J439 Emphysema, unspecified: Secondary | ICD-10-CM | POA: Diagnosis not present

## 2024-03-11 LAB — BASIC METABOLIC PANEL WITH GFR
Anion gap: 10 (ref 5–15)
BUN: 14 mg/dL (ref 8–23)
CO2: 32 mmol/L (ref 22–32)
Calcium: 9.2 mg/dL (ref 8.9–10.3)
Chloride: 96 mmol/L — ABNORMAL LOW (ref 98–111)
Creatinine, Ser: 0.95 mg/dL (ref 0.61–1.24)
GFR, Estimated: >60 mL/min (ref >60)
Glucose, Bld: 179 mg/dL — ABNORMAL HIGH (ref 70–99)
Potassium: 3.5 mmol/L (ref 3.5–5.1)
Sodium: 138 mmol/L (ref 135–145)

## 2024-03-11 LAB — APTT: aPTT: 37 s — ABNORMAL HIGH (ref 24–36)

## 2024-03-11 LAB — CBC
HCT: 46.9 % (ref 39.0–52.0)
Hemoglobin: 15.3 g/dL (ref 13.0–17.0)
MCH: 30 pg (ref 26.0–34.0)
MCHC: 32.6 g/dL (ref 30.0–36.0)
MCV: 92 fL (ref 80.0–100.0)
Platelets: 238 10*3/uL (ref 150–400)
RBC: 5.1 MIL/uL (ref 4.22–5.81)
RDW: 14.7 % (ref 11.5–15.5)
WBC: 9.7 10*3/uL (ref 4.0–10.5)
nRBC: 0 % (ref 0.0–0.2)

## 2024-03-11 LAB — TROPONIN I (HIGH SENSITIVITY)
Troponin I (High Sensitivity): 15 ng/L (ref <18)
Troponin I (High Sensitivity): 16 ng/L (ref <18)

## 2024-03-11 LAB — PROTIME-INR
INR: 2 — ABNORMAL HIGH (ref 0.8–1.2)
Prothrombin Time: 22.9 s — ABNORMAL HIGH (ref 11.4–15.2)

## 2024-03-11 MED ORDER — IOHEXOL 350 MG/ML SOLN
75.0000 mL | Freq: Once | INTRAVENOUS | Status: AC | PRN
  Administered 2024-03-11: 75 mL via INTRAVENOUS

## 2024-03-11 NOTE — Discharge Instructions (Signed)
 As we discussed please obtain plenty of rest and drink plenty of fluids over the weekend such as Gatorade or Pedialyte.  Please call your cardiologist Monday morning and they will arrange for a Holter monitor (portable cardiac monitor) to be placed.  Return to the emergency department for any further episodes of feeling like you are going to pass out, any chest pain, or any other symptom personally concerning to yourself.

## 2024-03-11 NOTE — ED Triage Notes (Signed)
 Pt states ablation 3 weeks ago, pt sent from cardiology for work up. Pt states he felt fine after ablation, states he is been having lightheaded episodes.

## 2024-03-11 NOTE — ED Provider Notes (Signed)
-----------------------------------------   6:53 PM on 03/11/2024 ----------------------------------------- Patient's workup is overall reassuring.  Troponin are negative x 2, CBC is normal, chemistry is normal.  Patient CTA has resulted showing no acute finding, no pericardial effusion and no PE.  I spoke to Dr. Junnie Olives, he states he will have the patient follow-up in the office on Monday for a Holter monitor.  I spoke to the patient regarding this they are agreeable to this plan of care as well and would prefer to go home.  Patient states he is feeling much better.  I discussed with the patient plenty of rest over the weekend and increasing the patient's fluids such as Gatorade or Pedialyte.  Patient will follow-up with his cardiologist on Monday.  Provided my typical near-syncope return precautions.   Ruth Cove, MD 03/11/24 [id number]

## 2024-03-11 NOTE — ED Notes (Signed)
 Pt relaxing at this time and in NAD. Pt wife sitting at bedside with him.

## 2024-03-11 NOTE — ED Notes (Signed)
 Pt urine sent down with Pt label.

## 2024-03-11 NOTE — ED Notes (Signed)
 Pt ambulated to the in-room toilet without incident.

## 2024-03-11 NOTE — Telephone Encounter (Signed)
 Patient identification verified by 2 forms. Sims Duck, RN     Called and spoke to patient. When I asked patient to explain what he was experiencing he stated he was already at the emergency room being evaluated.    He will update us  once discharged.

## 2024-03-11 NOTE — ED Notes (Signed)
 Fall precautions in place for Pt. This RN placed fall band, fall grip socks, bed alarm and fall sign.

## 2024-03-11 NOTE — Telephone Encounter (Signed)
 STAT if patient feels like he/she is going to faint   Are you dizzy, lightheaded, or faint now?  No. Occurs when up and moving around. Patient says since Monday or Tuesday he feels weak and lightheaded.  Have you passed out?  No   Do you have any other symptoms?  Weakness   Have you checked your HR and BP (record if available)?  Hasn't checked.

## 2024-03-11 NOTE — ED Provider Notes (Signed)
 [facility] Provider Note    Event Date/Time   First MD Initiated Contact with Patient 03/11/24 1338     (approximate)   History   Near Syncope   HPI  JUNG YURCHAK is a 73 y.o. male with significant past medical history including COPD, CAD, CABG x 4, CHF, atrial fibrillation/flutter status post ablation on May 23.  He has been doing well status post ablation however over the last 5 days has had significant lightheadedness and weakness, he has felt like he was going to faint.  He does have occasional chest discomfort as well.  No fevers chills.  Reports compliance with his medication, feels like he is outputting a lot of fluid he is on Lasix .     Physical Exam   Triage Vital Signs: ED Triage Vitals  Encounter Vitals Group     BP 03/11/24 1318 131/74     Girls Systolic BP Percentile --      Girls Diastolic BP Percentile --      Boys Systolic BP Percentile --      Boys Diastolic BP Percentile --      Pulse Rate 03/11/24 1318 91     Resp 03/11/24 1318 16     Temp 03/11/24 1318 97.6 F (36.4 C)     Temp Source 03/11/24 1318 Oral     SpO2 03/11/24 1318 99 %     Weight 03/11/24 1319 71.7 kg (158 lb)     Height 03/11/24 1319 1.676 m (5' 6)     Head Circumference --      Peak Flow --      Pain Score 03/11/24 1328 0     Pain Loc --      Pain Education --      Exclude from Growth Chart --     Most recent vital signs: Vitals:   03/11/24 1318 03/11/24 1400  BP: 131/74 123/64  Pulse: 91 74  Resp: 16 (!) 22  Temp: 97.6 F (36.4 C)   SpO2: 99% 98%     General: Awake, no distress.  CV:  Good peripheral perfusion.  Resp:  Normal effort.  Clear to auscultation Abd:  No distention.  Other:  No lower extremity swelling or edema   ED Results / Procedures / Treatments   Labs (all labs ordered are listed, but only abnormal results are displayed) Labs Reviewed  BASIC METABOLIC PANEL WITH GFR - Abnormal; Notable for the following components:       Result Value   Chloride 96 (*)    Glucose, Bld 179 (*)    All other components within normal limits  PROTIME-INR - Abnormal; Notable for the following components:   Prothrombin Time 22.9 (*)    INR 2.0 (*)    All other components within normal limits  APTT - Abnormal; Notable for the following components:   aPTT 37 (*)    All other components within normal limits  CBC  TROPONIN I (HIGH SENSITIVITY)  TROPONIN I (HIGH SENSITIVITY)     EKG  ED ECG REPORT I, Bryson Carbine, the attending physician, personally viewed and interpreted this ECG.  Date: 03/11/2024  Rhythm: normal sinus rhythm QRS Axis: normal Intervals: Right bundle branch block ST/T Wave abnormalities: normal Narrative Interpretation: no evidence of acute ischemia    RADIOLOGY Chest x-ray viewed interpret by me, no acute abnormality    PROCEDURES:  Critical Care performed:   Procedures   MEDICATIONS ORDERED IN ED: Medications - No data  to display   IMPRESSION / MDM / ASSESSMENT AND PLAN / ED COURSE  I reviewed the triage vital signs and the nursing notes. Patient's presentation is most consistent with acute presentation with potential threat to life or bodily function.  Patient presents with lightheadedness as detailed above, differential is broad including dehydration, electrolyte abnormality, medication side effect, given recent ablation possibility of arrhythmia and/or pericardial effusion  Will place in the cardiac monitor, obtain labs, he is on anticoagulation and consider imaging  Lab work is generally reassuring, high sensitive troponin is normal, CBC chemistries unremarkable.  Chest x-ray without evidence of infiltrate.  Will send for CT angio to evaluate for pericardial effusion or AAS  Have asked my colleague to follow-up on CT results and reevaluate        FINAL CLINICAL IMPRESSION(S) / ED DIAGNOSES   Final diagnoses:  Near syncope     Rx / DC Orders   ED Discharge  Orders     None        Note:  This document was prepared using Dragon voice recognition software and may include unintentional dictation errors.   Bryson Carbine, MD 03/11/24 1501

## 2024-03-14 DIAGNOSIS — R55 Syncope and collapse: Secondary | ICD-10-CM | POA: Diagnosis not present

## 2024-03-18 ENCOUNTER — Ambulatory Visit: Attending: Cardiology | Admitting: Cardiology

## 2024-03-18 ENCOUNTER — Encounter: Payer: Self-pay | Admitting: Cardiology

## 2024-03-18 VITALS — BP 138/72 | HR 80 | Ht 65.0 in | Wt 151.6 lb

## 2024-03-18 DIAGNOSIS — J9610 Chronic respiratory failure, unspecified whether with hypoxia or hypercapnia: Secondary | ICD-10-CM | POA: Diagnosis not present

## 2024-03-18 DIAGNOSIS — I480 Paroxysmal atrial fibrillation: Secondary | ICD-10-CM

## 2024-03-18 DIAGNOSIS — F411 Generalized anxiety disorder: Secondary | ICD-10-CM | POA: Diagnosis not present

## 2024-03-18 DIAGNOSIS — I5032 Chronic diastolic (congestive) heart failure: Secondary | ICD-10-CM

## 2024-03-18 DIAGNOSIS — Z951 Presence of aortocoronary bypass graft: Secondary | ICD-10-CM | POA: Diagnosis not present

## 2024-03-18 DIAGNOSIS — Z1331 Encounter for screening for depression: Secondary | ICD-10-CM | POA: Diagnosis not present

## 2024-03-18 DIAGNOSIS — Z9889 Other specified postprocedural states: Secondary | ICD-10-CM | POA: Diagnosis not present

## 2024-03-18 DIAGNOSIS — J449 Chronic obstructive pulmonary disease, unspecified: Secondary | ICD-10-CM | POA: Diagnosis not present

## 2024-03-18 DIAGNOSIS — I483 Typical atrial flutter: Secondary | ICD-10-CM

## 2024-03-18 DIAGNOSIS — D6869 Other thrombophilia: Secondary | ICD-10-CM

## 2024-03-18 DIAGNOSIS — Z0000 Encounter for general adult medical examination without abnormal findings: Secondary | ICD-10-CM | POA: Diagnosis not present

## 2024-03-18 DIAGNOSIS — I739 Peripheral vascular disease, unspecified: Secondary | ICD-10-CM | POA: Diagnosis not present

## 2024-03-18 NOTE — Progress Notes (Signed)
 Electrophysiology Clinic Note    Date:  03/18/2024  Patient ID:  Dennis, Preston 10-23-1950, MRN [id number] PCP:  Antonio Baumgarten, MD  Cardiologist:  Antonette Batters, MD Electrophysiologist: Ardeen Kohler, MD   Discussed the use of AI scribe software for clinical note transcription with the patient, who gave verbal consent to proceed.   Patient Profile    Chief Complaint: AF ablation follow-up  History of Present Illness: Dennis Preston is a 73 y.o. male with PMH notable for parox AFib, aflutter, CAD s/p CABG, PAD s/p bifemoral bypass, HF, HTN, COPD on Os at night, OSA; seen today for Ardeen Kohler, MD for routine electrophysiology follow-up s/p Ablation.  Warfarin managed at [facility] cardiology.   He is s/p AF ablation w isolation of pulm veins, posterior wall, and CTI for aflutter on 02/18/2024 by Dr. Daneil Dunker.   He presented to ER 6/13 for several days of near-syncope. Workup negative in ER and so discharged.   On follow-up today, he has had no A-fib episodes that he is aware of since ablation procedure.  He believes that his near syncope episode last week was due to dehydration from spending too much time outdoors.  He has noticed less fatigue since the ablation.   He is currently wearing oxygen because he was unsure exactly how far he would need to walk to get to the clinic today.  He does not usually wear oxygen during the day only at night.  He has noticed that with the significant humidity and hot temperatures he has needed oxygen a little more regularly during the day. He denies chest pain, chest pressure, palpitations.  He states that his bilateral groin access sites are completely healed without any concerns right now.     Arrhythmia/Device History No specialty comments available.    ROS:  Please see the history of present illness. All other systems are reviewed and otherwise negative.    Physical Exam    VS:  BP 138/72 (BP Location: Left Arm, Patient  Position: Sitting, Cuff Size: Normal)   Pulse 80   Ht 5' 5 (1.651 m)   Wt 151 lb 9.6 oz (68.8 kg)   SpO2 96%   BMI 25.23 kg/m  BMI: Body mass index is 25.23 kg/m.      Wt Readings from Last 3 Encounters:  03/18/24 151 lb 9.6 oz (68.8 kg)  03/11/24 158 lb 0.4 oz (71.7 kg)  02/03/24 158 lb 1.1 oz (71.7 kg)     GEN- The patient is well appearing, alert and oriented x 3 today.   Lungs- Clear to ausculation bilaterally, normal work of breathing. [place] in place Heart- Regular rate and rhythm, no murmurs, rubs or gallops Extremities- No peripheral edema, warm, dry   Studies Reviewed   Previous EP, cardiology notes.    EKG is ordered. Personal review of EKG from today shows:    EKG Interpretation Date/Time:  Friday March 18 2024 13:45:26 EDT Ventricular Rate:  80 PR Interval:  142 QRS Duration:  110 QT Interval:  412 QTC Calculation: 475 R Axis:   94  Text Interpretation: Sinus rhythm Rightward axis Incomplete right bundle branch block Confirmed by Nasri Boakye [phone]) on 03/18/2024 1:50:55 PM    TEE, 12/28/2023  1. Left ventricular ejection fraction, by estimation, is 55 to 60%. Left ventricular ejection fraction by 3D volume is 58 %. The left ventricle has normal function.   2. Right ventricular systolic function is normal. The right ventricular size is  normal.   3. Left atrial size was mild to moderately dilated. No left atrial/left atrial appendage thrombus was detected. The LAA emptying velocity was 52 cm/s.   4. Right atrial size was mildly dilated.   5. The mitral valve is grossly normal. Trivial mitral valve regurgitation. No evidence of mitral stenosis.   6. The aortic valve is tricuspid. Aortic valve regurgitation is mild. No aortic stenosis is present.   7. There is mild (Grade II) plaque involving the descending aorta and aortic arch.   8. 3D performed for the EF and 3D performed of the LAA and demonstrates 3D LVEF normal. 3D LAA without thrombus.   TTE,  08/25/2023  1. Left ventricular ejection fraction, by estimation, is 55 to 60%. The left ventricle has normal function. The left ventricle has no regional wall motion abnormalities. There is mild concentric left ventricular hypertrophy. Left ventricular diastolic function could not be evaluated.   2. Right ventricular systolic function is mildly reduced. The right ventricular size is normal.   3. The mitral valve is normal in structure. Trivial mitral valve regurgitation. No evidence of mitral stenosis.   4. The aortic valve is tricuspid. There is mild calcification of the aortic valve. Aortic valve regurgitation is not visualized. Aortic valve sclerosis/calcification is present, without any evidence of aortic stenosis.   5. The inferior vena cava is normal in size with greater than 50% respiratory variability, suggesting right atrial pressure of 3 mmHg.     Assessment and Plan     #) parox AFib #) typical atrial flutter S/p AF, aflutter ablation 01/2024 by Dr. Daneil Dunker No atrial arrhythmias since procedure.   #) Hypercoag d/t parox afib CHA2DS2-VASc Score = at least 6 [CHF History: 1, HTN History: 1, Diabetes History: 0, Stroke History: 2, Vascular Disease History: 1, Age Score: 1, Gender Score: 0].  Therefore, the patient's annual risk of stroke is 9.7 %.    Stroke ppx - warfarin, managed at [facility] cardiology,  INRs therapeutic.  No bleeding concerns  #) HF #) HTN #) dizziness Blood pressure well-controlled today in office, he continues to follow-up regularly with PCP and general cardiology.  No medication changes today       Current medicines are reviewed at length with the patient today.   The patient does not have concerns regarding his medicines.  The following changes were made today:  none  Labs/ tests ordered today include:  Orders Placed This Encounter  Procedures   EKG 12-Lead     Disposition: Follow up with Dr. Daneil Dunker or EP APP in 2 months    Signed, Renette Hsu,  NP  03/18/24  2:50 PM  Electrophysiology [facility]

## 2024-03-18 NOTE — Patient Instructions (Signed)
 Medication Instructions:  The current medical regimen is effective;  continue present plan and medications as directed. Please refer to the Current Medication list given to you today.   *If you need a refill on your cardiac medications before your next appointment, please call your pharmacy*  Follow-Up: At [facility], you and your health needs are our priority.  As part of our continuing mission to provide you with exceptional heart care, our providers are all part of one team.  This team includes your primary Cardiologist (physician) and Advanced Practice Providers or APPs (Physician Assistants and Nurse Practitioners) who all work together to provide you with the care you need, when you need it.  Your next appointment:   Keep scheduled appointments  Provider:   Suzann Riddle, NP    We recommend signing up for the patient portal called "MyChart".  Sign up information is provided on this After Visit Summary.  MyChart is used to connect with patients for Virtual Visits (Telemedicine).  Patients are able to view lab/test results, encounter notes, upcoming appointments, etc.  Non-urgent messages can be sent to your provider as well.   To learn more about what you can do with MyChart, go to [email or web address].

## 2024-03-28 DIAGNOSIS — Z7901 Long term (current) use of anticoagulants: Secondary | ICD-10-CM | POA: Diagnosis not present

## 2024-03-28 DIAGNOSIS — I480 Paroxysmal atrial fibrillation: Secondary | ICD-10-CM | POA: Diagnosis not present

## 2024-04-05 DIAGNOSIS — R55 Syncope and collapse: Secondary | ICD-10-CM | POA: Diagnosis not present

## 2024-04-11 DIAGNOSIS — Z7901 Long term (current) use of anticoagulants: Secondary | ICD-10-CM | POA: Diagnosis not present

## 2024-04-11 DIAGNOSIS — I480 Paroxysmal atrial fibrillation: Secondary | ICD-10-CM | POA: Diagnosis not present

## 2024-04-12 ENCOUNTER — Encounter: Payer: Self-pay | Admitting: Acute Care

## 2024-04-28 DIAGNOSIS — I4892 Unspecified atrial flutter: Secondary | ICD-10-CM | POA: Diagnosis not present

## 2024-04-28 DIAGNOSIS — I214 Non-ST elevation (NSTEMI) myocardial infarction: Secondary | ICD-10-CM | POA: Diagnosis not present

## 2024-04-28 DIAGNOSIS — I5032 Chronic diastolic (congestive) heart failure: Secondary | ICD-10-CM | POA: Diagnosis not present

## 2024-04-28 DIAGNOSIS — I10 Essential (primary) hypertension: Secondary | ICD-10-CM | POA: Diagnosis not present

## 2024-04-28 DIAGNOSIS — Z951 Presence of aortocoronary bypass graft: Secondary | ICD-10-CM | POA: Diagnosis not present

## 2024-04-28 DIAGNOSIS — Z7901 Long term (current) use of anticoagulants: Secondary | ICD-10-CM | POA: Diagnosis not present

## 2024-04-28 DIAGNOSIS — E785 Hyperlipidemia, unspecified: Secondary | ICD-10-CM | POA: Diagnosis not present

## 2024-04-28 DIAGNOSIS — I2510 Atherosclerotic heart disease of native coronary artery without angina pectoris: Secondary | ICD-10-CM | POA: Diagnosis not present

## 2024-04-28 DIAGNOSIS — I480 Paroxysmal atrial fibrillation: Secondary | ICD-10-CM | POA: Diagnosis not present

## 2024-04-28 NOTE — Progress Notes (Signed)
 Documentation for warfarin management Last 3 INR results:  Lab Results  Component Value Date   INR 2.0 (!) 04/28/2024   INR 2.1 (!) 04/11/2024   INR 1.8 (!) 03/28/2024    Dosage adjustment or any change in the treatment regimen today: No. INR within goal range.  Patient advised to continue current warfarin dosing regimen of 13mg  total weekly dose, 1.5mg  (1mg  x 1.5) Sat,Sun and 2mg  (2mg  x 1) all other days.   Management of result: nurse driven protocol implemented  Patient/caregiver education provided during today's visit: dosing.  Patient/caregiver did verbalize understanding of the no change in dose and any education provided.   The patient/caregiver is instructed to return in one month for recheck.  Future Appointments     Date/Time Provider Department Center Visit Type   04/28/2024 11:15 AM Callwood, Cara Endow, MD [facility] C FOLLOW UP   05/25/2024 2:00 PM [facility] WE[facility]ARDIO NURSE POD [facility] C NURSE VISIT   05/25/2024 2:00 PM [facility]-ANTICOAG [facility] MARYL BROCKS ANTI-COAG   07/04/2024 1:15 PM Theotis Lavelle BRAVO, MD [facility] C RETURN VISIT   07/07/2024 2:30 PM Florencio Cara Endow, MD [facility] C FOLLOW UP   09/08/2024 9:15 AM KC WEST LAB [facility] [place] C LAB   09/15/2024 1:15 PM Hande, Tamra Cal, MD [facility] MARYL BROCKS [facility] OFFICE VISIT

## 2024-05-15 ENCOUNTER — Inpatient Hospital Stay (HOSPITAL_COMMUNITY)
Admission: EM | Admit: 2024-05-15 | Discharge: 2024-05-18 | DRG: 189 | Disposition: A | Discharge status: Home / Self Care | Attending: Internal Medicine | Admitting: Internal Medicine

## 2024-05-15 ENCOUNTER — Encounter (HOSPITAL_COMMUNITY): Payer: Self-pay | Admitting: Internal Medicine

## 2024-05-15 ENCOUNTER — Emergency Department (HOSPITAL_COMMUNITY)

## 2024-05-15 ENCOUNTER — Other Ambulatory Visit: Payer: Self-pay

## 2024-05-15 DIAGNOSIS — R9431 Abnormal electrocardiogram [ECG] [EKG]: Secondary | ICD-10-CM | POA: Diagnosis not present

## 2024-05-15 DIAGNOSIS — I110 Hypertensive heart disease with heart failure: Secondary | ICD-10-CM | POA: Diagnosis present

## 2024-05-15 DIAGNOSIS — R0902 Hypoxemia: Secondary | ICD-10-CM | POA: Diagnosis present

## 2024-05-15 DIAGNOSIS — I739 Peripheral vascular disease, unspecified: Secondary | ICD-10-CM | POA: Diagnosis present

## 2024-05-15 DIAGNOSIS — I25118 Atherosclerotic heart disease of native coronary artery with other forms of angina pectoris: Secondary | ICD-10-CM

## 2024-05-15 DIAGNOSIS — I4892 Unspecified atrial flutter: Secondary | ICD-10-CM | POA: Diagnosis present

## 2024-05-15 DIAGNOSIS — R079 Chest pain, unspecified: Secondary | ICD-10-CM | POA: Diagnosis not present

## 2024-05-15 DIAGNOSIS — Z7901 Long term (current) use of anticoagulants: Secondary | ICD-10-CM | POA: Diagnosis not present

## 2024-05-15 DIAGNOSIS — I5032 Chronic diastolic (congestive) heart failure: Secondary | ICD-10-CM | POA: Diagnosis present

## 2024-05-15 DIAGNOSIS — J441 Chronic obstructive pulmonary disease with (acute) exacerbation: Principal | ICD-10-CM | POA: Diagnosis present

## 2024-05-15 DIAGNOSIS — K219 Gastro-esophageal reflux disease without esophagitis: Secondary | ICD-10-CM | POA: Diagnosis present

## 2024-05-15 DIAGNOSIS — Z818 Family history of other mental and behavioral disorders: Secondary | ICD-10-CM | POA: Diagnosis not present

## 2024-05-15 DIAGNOSIS — Z951 Presence of aortocoronary bypass graft: Secondary | ICD-10-CM

## 2024-05-15 DIAGNOSIS — R059 Cough, unspecified: Secondary | ICD-10-CM | POA: Diagnosis not present

## 2024-05-15 DIAGNOSIS — Z7902 Long term (current) use of antithrombotics/antiplatelets: Secondary | ICD-10-CM

## 2024-05-15 DIAGNOSIS — Z1152 Encounter for screening for COVID-19: Secondary | ICD-10-CM | POA: Diagnosis not present

## 2024-05-15 DIAGNOSIS — J9621 Acute and chronic respiratory failure with hypoxia: Secondary | ICD-10-CM | POA: Diagnosis present

## 2024-05-15 DIAGNOSIS — I10 Essential (primary) hypertension: Secondary | ICD-10-CM | POA: Diagnosis present

## 2024-05-15 DIAGNOSIS — I480 Paroxysmal atrial fibrillation: Secondary | ICD-10-CM | POA: Diagnosis present

## 2024-05-15 DIAGNOSIS — R6510 Systemic inflammatory response syndrome (SIRS) of non-infectious origin without acute organ dysfunction: Secondary | ICD-10-CM | POA: Diagnosis present

## 2024-05-15 DIAGNOSIS — R000 Tachycardia, unspecified: Secondary | ICD-10-CM | POA: Diagnosis not present

## 2024-05-15 DIAGNOSIS — F411 Generalized anxiety disorder: Secondary | ICD-10-CM | POA: Diagnosis present

## 2024-05-15 DIAGNOSIS — R42 Dizziness and giddiness: Secondary | ICD-10-CM | POA: Diagnosis not present

## 2024-05-15 DIAGNOSIS — I252 Old myocardial infarction: Secondary | ICD-10-CM

## 2024-05-15 DIAGNOSIS — Z79899 Other long term (current) drug therapy: Secondary | ICD-10-CM

## 2024-05-15 DIAGNOSIS — Z886 Allergy status to analgesic agent status: Secondary | ICD-10-CM

## 2024-05-15 DIAGNOSIS — R7989 Other specified abnormal findings of blood chemistry: Secondary | ICD-10-CM | POA: Diagnosis present

## 2024-05-15 DIAGNOSIS — I2510 Atherosclerotic heart disease of native coronary artery without angina pectoris: Secondary | ICD-10-CM | POA: Diagnosis present

## 2024-05-15 DIAGNOSIS — R0602 Shortness of breath: Secondary | ICD-10-CM | POA: Diagnosis not present

## 2024-05-15 DIAGNOSIS — I4891 Unspecified atrial fibrillation: Secondary | ICD-10-CM | POA: Diagnosis present

## 2024-05-15 DIAGNOSIS — R791 Abnormal coagulation profile: Secondary | ICD-10-CM | POA: Diagnosis present

## 2024-05-15 DIAGNOSIS — E785 Hyperlipidemia, unspecified: Secondary | ICD-10-CM | POA: Diagnosis present

## 2024-05-15 DIAGNOSIS — Z720 Tobacco use: Secondary | ICD-10-CM | POA: Diagnosis present

## 2024-05-15 DIAGNOSIS — Z9981 Dependence on supplemental oxygen: Secondary | ICD-10-CM | POA: Diagnosis not present

## 2024-05-15 DIAGNOSIS — J9620 Acute and chronic respiratory failure, unspecified whether with hypoxia or hypercapnia: Secondary | ICD-10-CM | POA: Diagnosis present

## 2024-05-15 LAB — CBC
HCT: 45.3 % (ref 39.0–52.0)
Hemoglobin: 15 g/dL (ref 13.0–17.0)
MCH: 30.7 pg (ref 26.0–34.0)
MCHC: 33.1 g/dL (ref 30.0–36.0)
MCV: 92.6 fL (ref 80.0–100.0)
Platelets: 195 K/uL (ref 150–400)
RBC: 4.89 MIL/uL (ref 4.22–5.81)
RDW: 14.3 % (ref 11.5–15.5)
WBC: 21.6 K/uL — ABNORMAL HIGH (ref 4.0–10.5)
nRBC: 0 % (ref 0.0–0.2)

## 2024-05-15 LAB — RESPIRATORY PANEL BY PCR

## 2024-05-15 LAB — I-STAT VENOUS BLOOD GAS, ED
Acid-Base Excess: 6 mmol/L — ABNORMAL HIGH (ref 0.0–2.0)
Bicarbonate: 33.6 mmol/L — ABNORMAL HIGH (ref 20.0–28.0)
Calcium, Ion: 1.15 mmol/L (ref 1.15–1.40)
HCT: 46 % (ref 39.0–52.0)
Hemoglobin: 15.6 g/dL (ref 13.0–17.0)
O2 Saturation: 96 %
Potassium: 3.7 mmol/L (ref 3.5–5.1)
Sodium: 138 mmol/L (ref 135–145)
TCO2: 35 mmol/L — ABNORMAL HIGH (ref 22–32)
pCO2, Ven: 58.5 mmHg (ref 44–60)
pH, Ven: 7.367 (ref 7.25–7.43)
pO2, Ven: 86 mmHg — ABNORMAL HIGH (ref 32–45)

## 2024-05-15 LAB — C-REACTIVE PROTEIN: CRP: 11.7 mg/dL — ABNORMAL HIGH (ref <1.0)

## 2024-05-15 LAB — BASIC METABOLIC PANEL WITH GFR
Anion gap: 9 (ref 5–15)
BUN: 11 mg/dL (ref 8–23)
CO2: 30 mmol/L (ref 22–32)
Calcium: 9.2 mg/dL (ref 8.9–10.3)
Chloride: 98 mmol/L (ref 98–111)
Creatinine, Ser: 1.08 mg/dL (ref 0.61–1.24)
GFR, Estimated: >60 mL/min (ref >60)
Glucose, Bld: 144 mg/dL — ABNORMAL HIGH (ref 70–99)
Potassium: 3.6 mmol/L (ref 3.5–5.1)
Sodium: 137 mmol/L (ref 135–145)

## 2024-05-15 LAB — BRAIN NATRIURETIC PEPTIDE: B Natriuretic Peptide: 101.4 pg/mL — ABNORMAL HIGH (ref 0.0–100.0)

## 2024-05-15 LAB — TROPONIN I (HIGH SENSITIVITY)
Troponin I (High Sensitivity): 30 ng/L — ABNORMAL HIGH (ref <18)
Troponin I (High Sensitivity): 28 ng/L — ABNORMAL HIGH (ref <18)
Troponin I (High Sensitivity): 23 ng/L — ABNORMAL HIGH (ref <18)

## 2024-05-15 LAB — HEPATIC FUNCTION PANEL
ALT: 15 U/L (ref 0–44)
AST: 27 U/L (ref 15–41)
Albumin: 3.4 g/dL — ABNORMAL LOW (ref 3.5–5.0)
Alkaline Phosphatase: 76 U/L (ref 38–126)
Bilirubin, Direct: 0.2 mg/dL (ref 0.0–0.2)
Indirect Bilirubin: 0.5 mg/dL (ref 0.3–0.9)
Total Bilirubin: 0.7 mg/dL (ref 0.0–1.2)
Total Protein: 6.3 g/dL — ABNORMAL LOW (ref 6.5–8.1)

## 2024-05-15 LAB — PROTIME-INR
INR: 1.9 — ABNORMAL HIGH (ref 0.8–1.2)
Prothrombin Time: 22.9 s — ABNORMAL HIGH (ref 11.4–15.2)

## 2024-05-15 LAB — RESP PANEL BY RT-PCR (RSV, FLU A&B, COVID)  RVPGX2
Influenza A by PCR: NEGATIVE
Influenza B by PCR: NEGATIVE
Resp Syncytial Virus by PCR: NEGATIVE
SARS Coronavirus 2 by RT PCR: NEGATIVE

## 2024-05-15 LAB — D-DIMER, QUANTITATIVE: D-Dimer, Quant: <0.27 ug{FEU}/mL (ref 0.00–0.50)

## 2024-05-15 MED ORDER — WARFARIN - PHARMACIST DOSING INPATIENT: Freq: Every day | Status: DC

## 2024-05-15 MED ORDER — ALUM & MAG HYDROXIDE-SIMETH 200-200-20 MG/5ML PO SUSP
30.0000 mL | ORAL | Status: DC | PRN
  Administered 2024-05-15: 30 mL via ORAL
  Filled 2024-05-15: qty 30

## 2024-05-15 MED ORDER — IPRATROPIUM-ALBUTEROL 0.5-2.5 (3) MG/3ML IN SOLN
3.0000 mL | Freq: Once | RESPIRATORY_TRACT | Status: AC
  Administered 2024-05-15: 3 mL via RESPIRATORY_TRACT
  Filled 2024-05-15: qty 3

## 2024-05-15 MED ORDER — SODIUM CHLORIDE 0.9 % IV SOLN
100.0000 mg | Freq: Two times a day (BID) | INTRAVENOUS | Status: DC
  Administered 2024-05-15 – 2024-05-17 (×5): 100 mg via INTRAVENOUS
  Filled 2024-05-15 (×6): qty 100

## 2024-05-15 MED ORDER — CLOPIDOGREL BISULFATE 75 MG PO TABS
75.0000 mg | ORAL_TABLET | Freq: Every morning | ORAL | Status: DC
  Administered 2024-05-15 – 2024-05-18 (×4): 75 mg via ORAL
  Filled 2024-05-15 (×4): qty 1

## 2024-05-15 MED ORDER — ACETAMINOPHEN 650 MG RE SUPP
650.0000 mg | Freq: Four times a day (QID) | RECTAL | Status: DC | PRN
Start: 2024-05-15 — End: 2024-05-18

## 2024-05-15 MED ORDER — SODIUM CHLORIDE 0.9 % IV SOLN
2.0000 g | INTRAVENOUS | Status: DC
  Administered 2024-05-15 – 2024-05-17 (×3): 2 g via INTRAVENOUS
  Filled 2024-05-15 (×3): qty 20

## 2024-05-15 MED ORDER — DILTIAZEM HCL ER COATED BEADS 180 MG PO CP24
180.0000 mg | ORAL_CAPSULE | Freq: Every morning | ORAL | Status: DC
  Administered 2024-05-15 – 2024-05-18 (×4): 180 mg via ORAL
  Filled 2024-05-15 (×4): qty 1

## 2024-05-15 MED ORDER — BUDESONIDE 0.5 MG/2ML IN SUSP
0.5000 mg | Freq: Two times a day (BID) | RESPIRATORY_TRACT | Status: DC
  Administered 2024-05-15 – 2024-05-18 (×7): 0.5 mg via RESPIRATORY_TRACT
  Filled 2024-05-15 (×7): qty 2

## 2024-05-15 MED ORDER — ALBUTEROL SULFATE (2.5 MG/3ML) 0.083% IN NEBU
2.5000 mg | INHALATION_SOLUTION | RESPIRATORY_TRACT | Status: DC | PRN
  Administered 2024-05-16: 2.5 mg via RESPIRATORY_TRACT
  Filled 2024-05-15: qty 3

## 2024-05-15 MED ORDER — METHYLPREDNISOLONE SODIUM SUCC 125 MG IJ SOLR
125.0000 mg | INTRAMUSCULAR | Status: AC
  Administered 2024-05-15: 125 mg via INTRAVENOUS
  Filled 2024-05-15: qty 2

## 2024-05-15 MED ORDER — SPIRONOLACTONE 12.5 MG HALF TABLET
12.5000 mg | ORAL_TABLET | Freq: Every day | ORAL | Status: DC
  Administered 2024-05-15 – 2024-05-18 (×4): 12.5 mg via ORAL
  Filled 2024-05-15 (×4): qty 1

## 2024-05-15 MED ORDER — PREDNISONE 20 MG PO TABS
40.0000 mg | ORAL_TABLET | Freq: Every day | ORAL | Status: DC
  Administered 2024-05-16 – 2024-05-18 (×3): 40 mg via ORAL
  Filled 2024-05-15 (×3): qty 2

## 2024-05-15 MED ORDER — IPRATROPIUM-ALBUTEROL 0.5-2.5 (3) MG/3ML IN SOLN
3.0000 mL | Freq: Four times a day (QID) | RESPIRATORY_TRACT | Status: DC
  Administered 2024-05-15 – 2024-05-16 (×5): 3 mL via RESPIRATORY_TRACT
  Filled 2024-05-15 (×5): qty 3

## 2024-05-15 MED ORDER — IPRATROPIUM-ALBUTEROL 0.5-2.5 (3) MG/3ML IN SOLN: 3.0000 mL | Freq: Four times a day (QID) | RESPIRATORY_TRACT | Status: DC

## 2024-05-15 MED ORDER — WARFARIN SODIUM 1 MG PO TABS: 1.5000 mg | ORAL_TABLET | ORAL | Status: DC

## 2024-05-15 MED ORDER — ATORVASTATIN CALCIUM 80 MG PO TABS
80.0000 mg | ORAL_TABLET | Freq: Every day | ORAL | Status: DC
  Administered 2024-05-15 – 2024-05-17 (×3): 80 mg via ORAL
  Filled 2024-05-15 (×3): qty 1

## 2024-05-15 MED ORDER — MORPHINE SULFATE (PF) 2 MG/ML IV SOLN: 2.0000 mg | INTRAVENOUS | Status: DC | PRN

## 2024-05-15 MED ORDER — ACETAMINOPHEN 500 MG PO TABS
1000.0000 mg | ORAL_TABLET | Freq: Once | ORAL | Status: AC
  Administered 2024-05-15: 1000 mg via ORAL
  Filled 2024-05-15: qty 2

## 2024-05-15 MED ORDER — WARFARIN SODIUM 2 MG PO TABS
2.0000 mg | ORAL_TABLET | Freq: Once | ORAL | Status: AC
  Administered 2024-05-15: 2 mg via ORAL
  Filled 2024-05-15 (×2): qty 1

## 2024-05-15 MED ORDER — FUROSEMIDE 20 MG PO TABS
60.0000 mg | ORAL_TABLET | Freq: Every morning | ORAL | Status: DC
  Administered 2024-05-15 – 2024-05-18 (×4): 60 mg via ORAL
  Filled 2024-05-15: qty 1
  Filled 2024-05-15: qty 3
  Filled 2024-05-15 (×2): qty 1

## 2024-05-15 MED ORDER — DIAZEPAM 5 MG/ML IJ SOLN
2.5000 mg | Freq: Four times a day (QID) | INTRAMUSCULAR | Status: DC | PRN
  Administered 2024-05-15: 2.5 mg via INTRAVENOUS
  Filled 2024-05-15: qty 2

## 2024-05-15 MED ORDER — WARFARIN SODIUM 3 MG PO TABS: 3.0000 mg | ORAL_TABLET | ORAL | Status: DC

## 2024-05-15 MED ORDER — ACETAMINOPHEN 325 MG PO TABS: 650.0000 mg | ORAL_TABLET | Freq: Four times a day (QID) | ORAL | Status: DC | PRN

## 2024-05-15 MED ORDER — TRIMETHOBENZAMIDE HCL 100 MG/ML IM SOLN
200.0000 mg | Freq: Four times a day (QID) | INTRAMUSCULAR | Status: DC | PRN
  Administered 2024-05-15: 200 mg via INTRAMUSCULAR
  Filled 2024-05-15: qty 2

## 2024-05-15 MED ORDER — GUAIFENESIN ER 600 MG PO TB12
600.0000 mg | ORAL_TABLET | Freq: Two times a day (BID) | ORAL | Status: DC
  Administered 2024-05-15 – 2024-05-18 (×7): 600 mg via ORAL
  Filled 2024-05-15 (×7): qty 1

## 2024-05-15 MED ORDER — WARFARIN SODIUM 2 MG PO TABS
2.0000 mg | ORAL_TABLET | ORAL | Status: DC
  Administered 2024-05-16 – 2024-05-17 (×2): 2 mg via ORAL
  Filled 2024-05-15 (×2): qty 1

## 2024-05-15 MED ORDER — SODIUM CHLORIDE 0.9% FLUSH
3.0000 mL | Freq: Two times a day (BID) | INTRAVENOUS | Status: DC
  Administered 2024-05-15 – 2024-05-18 (×4): 3 mL via INTRAVENOUS

## 2024-05-15 MED ORDER — MAGNESIUM SULFATE 2 GM/50ML IV SOLN
2.0000 g | Freq: Once | INTRAVENOUS | Status: AC
  Administered 2024-05-15: 2 g via INTRAVENOUS
  Filled 2024-05-15: qty 50

## 2024-05-15 MED ORDER — DIAZEPAM 5 MG PO TABS
5.0000 mg | ORAL_TABLET | Freq: Every day | ORAL | Status: DC
Start: 2024-05-15 — End: 2024-05-18
  Administered 2024-05-15 – 2024-05-17 (×3): 5 mg via ORAL
  Filled 2024-05-15 (×3): qty 1

## 2024-05-15 MED ORDER — ALBUTEROL SULFATE (2.5 MG/3ML) 0.083% IN NEBU
10.0000 mg/h | INHALATION_SOLUTION | Freq: Once | RESPIRATORY_TRACT | Status: AC
  Administered 2024-05-15: 10 mg/h via RESPIRATORY_TRACT
  Filled 2024-05-15: qty 12

## 2024-05-15 MED ORDER — ARFORMOTEROL TARTRATE 15 MCG/2ML IN NEBU
15.0000 ug | INHALATION_SOLUTION | Freq: Two times a day (BID) | RESPIRATORY_TRACT | Status: DC
  Administered 2024-05-15 – 2024-05-18 (×5): 15 ug via RESPIRATORY_TRACT
  Filled 2024-05-15 (×6): qty 2

## 2024-05-15 NOTE — Plan of Care (Signed)
  Problem: Education: Goal: Knowledge of General Education information will improve Description: Including pain rating scale, medication(s)/side effects and non-pharmacologic comfort measures Outcome: Progressing   Problem: Health Behavior/Discharge Planning: Goal: Ability to manage health-related needs will improve Outcome: Progressing   Problem: Clinical Measurements: Goal: Ability to maintain clinical measurements within normal limits will improve Outcome: Progressing Goal: Diagnostic test results will improve Outcome: Progressing   Problem: Coping: Goal: Level of anxiety will decrease Outcome: Progressing   Problem: Pain Managment: Goal: General experience of comfort will improve and/or be controlled Outcome: Progressing   Problem: Safety: Goal: Ability to remain free from injury will improve Outcome: Progressing   Problem: Education: Goal: Knowledge of disease or condition will improve Outcome: Progressing

## 2024-05-15 NOTE — Progress Notes (Addendum)
 PHARMACY - ANTICOAGULATION CONSULT NOTE  Pharmacy Consult for Warfarin  Indication: atrial fibrillation  Allergies  Allergen Reactions   Aspirin  Swelling    REACTION: reaction not known    Patient Measurements: Height: 5' 11 (180.3 cm) Weight: 68 kg (150 lb) IBW/kg (Calculated) : 75.3 HEPARIN  DW (KG): 68  Vital Signs: Temp: 98.7 F (37.1 C) (08/17 0641) Temp Source: Oral (08/17 0641) BP: 120/50 (08/17 0630) Pulse Rate: 87 (08/17 0630)  Labs: Recent Labs    05/15/24 0234 05/15/24 0318 05/15/24 0633  HGB 15.0 15.6  --   HCT 45.3 46.0  --   PLT 195  --   --   LABPROT 22.9*  --   --   INR 1.9*  --   --   CREATININE 1.08  --   --   TROPONINIHS 30*  --  28*    Estimated Creatinine Clearance: 59.5 mL/min (by C-G formula based on SCr of 1.08 mg/dL).   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 Per patient he takes Warfarin 2 mg Mon-Fri and 1.5 mg Saturday and Sunday. INR 1.9, CBC WNL and stable. Pharmacy is consulted to resume Warfarin.   Goal of Therapy:  INR 2-3 Monitor platelets by anticoagulation protocol: Yes   Plan:  Will give 2mg  today due to subtherapeutic INR. Will resume home dose tomorrow. Daily INRs.   Librada Castronovo M Gurfateh Mcclain 05/15/2024,8:06 AM

## 2024-05-15 NOTE — Progress Notes (Signed)
 PT Cancellation Note  Patient Details Name: Dennis Preston MRN: [id number] DOB: May 19, 1951   Cancelled Treatment:    Reason Eval/Treat Not Completed: (P) Patient declined, no reason specified. Pt politely declining PT today, stating he has been feeling unwell and having bouts of emesis and feels he is about to and needs to have another. Pt requesting PT check back tomorrow instead. Will plan to follow-up tomorrow.   Theo Ferretti, PT, DPT Acute Rehabilitation Services  Office: [phone]    Theo CHRISTELLA Ferretti 05/15/2024, 4:39 PM

## 2024-05-15 NOTE — ED Provider Notes (Signed)
 [place] [facility] AT [facility] Provider Note   CSN: [id number] Arrival date & time: 05/15/24  0222     Patient presents with: Shortness of Breath (/) and Chest Pain   Dennis Preston is a 73 y.o. male with history of CHF, NSTEMI MI, acute pulmonary edema, alcohol abuse, atrial fibrillation on anticoagulation, COPD no longer smoking for patient.  Patient presents to ED for evaluation of chest pain and shortness of breath.  Reports that earlier today he was outside, noted that it was very hot.  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  Reports that he pumped this jack up and about 1 hour later began having chest pain that resolved with rest as well as shortness of breath.  He reports that he believes he is having a COPD exacerbation.  He denies that he is still smoking.  He reports that he wears home oxygen at night and has not increased this recently.  He denies any leg swelling, nausea, vomiting, abdominal pain.  He reports that his wife is sick at home with unknown illness, common cold.  He endorses lightheadedness, dizziness, weakness on standing.  He received a breathing treatment with EMS.   Shortness of Breath Associated symptoms: chest pain   Chest Pain Associated symptoms: dizziness and shortness of breath        Prior to Admission medications   Medication Sig Start Date End Date Taking? Authorizing Provider  acetaminophen  (TYLENOL ) 325 MG tablet Take 2 tablets (650 mg total) by mouth every 4 (four) hours as needed for headache or mild pain (pain score 1-3). 02/19/24  Yes Lesia Ozell Barter, PA-C  atorvastatin  (LIPITOR ) 80 MG tablet Take 1 tablet (80 mg total) by mouth daily at 6 PM. 07/10/15  Yes Sainani, Vivek J, MD  clopidogrel  (PLAVIX ) 75 MG tablet Take 75 mg by mouth in the morning. 04/18/16  Yes [provider]  diazepam  (VALIUM ) 5 MG tablet Take 1 tablet (5 mg total) by mouth at bedtime.  08/26/23  Yes Joshua Domino, DO  diltiazem  (CARDIZEM  CD) 180 MG 24 hr capsule Take 1 capsule (180 mg total) by mouth in the morning. 02/19/24  Yes Tillery, Ozell Barter, PA-C  Fluticasone -Umeclidin-Vilant (TRELEGY ELLIPTA) 100-62.5-25 MCG/INH AEPB Inhale 1 puff into the lungs in the morning. 01/27/20  Yes [provider]  furosemide  (LASIX ) 40 MG tablet Take 40 mg in the morning and 20 mg (1/2 tablet) in the evening Patient taking differently: Take 60 mg by mouth every morning. 08/26/23  Yes Joshua Domino, DO  levalbuterol  (XOPENEX ) 1.25 MG/3ML nebulizer solution Take 1.25 mg by nebulization every 6 (six) hours as needed for wheezing or shortness of breath. 12/28/23  Yes Rai, Ripudeep K, MD  OXYGEN Inhale 2 L into the lungs at bedtime. Uses while sleeping   Yes [provider]  spironolactone  (ALDACTONE ) 25 MG tablet Take 0.5 tablets (12.5 mg total) by mouth daily. 11/26/23  Yes Rai, Ripudeep K, MD  warfarin (COUMADIN ) 1 MG tablet Take 1.5 mg by mouth See admin instructions. Take 1.5 tablets (1.5mg ) by mouth at bedtime on Saturday and Sunday 11/10/23  Yes [provider]  warfarin (COUMADIN ) 2 MG tablet Take 2 mg by mouth See admin instructions. Take 1 tablet (2mg ) by mouth at bedtime Monday - Friday.   Yes [provider]  colchicine 0.6 MG tablet Take 0.6 mg by mouth daily as needed (gout flare). Patient not taking:  Reported on 05/15/2024    [provider]    Allergies: Aspirin     Review of Systems  Respiratory:  Positive for shortness of breath.   Cardiovascular:  Positive for chest pain.  Neurological:  Positive for dizziness and light-headedness.  All other systems reviewed and are negative.   Updated Vital Signs BP (!) 120/50   Pulse 87   Temp 98.4 F (36.9 C) (Oral)   Resp (!) 32   Ht 5' 11 (1.803 m)   Wt 68 kg   SpO2 100%   BMI 20.92 kg/m   Physical Exam Vitals and nursing note reviewed.  Constitutional:      General: He is not in  acute distress.    Appearance: He is well-developed.  HENT:     Head: Normocephalic and atraumatic.  Eyes:     Conjunctiva/sclera: Conjunctivae normal.  Cardiovascular:     Rate and Rhythm: Normal rate and regular rhythm.     Heart sounds: No murmur heard. Pulmonary:     Effort: Pulmonary effort is normal. No respiratory distress.     Breath sounds: Wheezing present.  Abdominal:     Palpations: Abdomen is soft.     Tenderness: There is no abdominal tenderness.  Musculoskeletal:        General: No swelling.     Cervical back: Neck supple.     Right lower leg: No edema.     Left lower leg: No edema.  Skin:    General: Skin is warm and dry.     Capillary Refill: Capillary refill takes less than 2 seconds.  Neurological:     Mental Status: He is alert and oriented to person, place, and time. Mental status is at baseline.  Psychiatric:        Mood and Affect: Mood normal.     (all labs ordered are listed, but only abnormal results are displayed) Labs Reviewed  CBC - Abnormal; Notable for the following components:      Result Value   WBC 21.6 (*)    All other components within normal limits  BASIC METABOLIC PANEL WITH GFR - Abnormal; Notable for the following components:   Glucose, Bld 144 (*)    All other components within normal limits  BRAIN NATRIURETIC PEPTIDE - Abnormal; Notable for the following components:   B Natriuretic Peptide 101.4 (*)    All other components within normal limits  PROTIME-INR - Abnormal; Notable for the following components:   Prothrombin Time 22.9 (*)    INR 1.9 (*)    All other components within normal limits  I-STAT VENOUS BLOOD GAS, ED - Abnormal; Notable for the following components:   pO2, Ven 86 (*)    Bicarbonate 33.6 (*)    TCO2 35 (*)    Acid-Base Excess 6.0 (*)    All other components within normal limits  TROPONIN I (HIGH SENSITIVITY) - Abnormal; Notable for the following components:   Troponin I (High Sensitivity) 30 (*)    All  other components within normal limits  RESP PANEL BY RT-PCR (RSV, FLU A&B, COVID)  [id number]  TROPONIN I (HIGH SENSITIVITY)    EKG: EKG Interpretation Date/Time:  Sunday May 15 2024 06:15:03 EDT Ventricular Rate:  106 PR Interval:  164 QRS Duration:  145 QT Interval:  377 QTC Calculation: 501 R Axis:   95  Text Interpretation: Sinus tachycardia Multiple premature complexes, vent & supraven RBBB and LPFB Confirmed by Midge Golas ([id number]) on 05/15/2024 6:20:23 AM  Radiology: [facility]  Chest Portable 1 View Result Date: 05/15/2024 CLINICAL DATA:  Shortness of breath, chest pain, cough EXAM: PORTABLE CHEST 1 VIEW COMPARISON:  03/11/2024 FINDINGS: Prior CABG. Heart and mediastinal contours are within normal limits. No focal opacities or effusions. No acute bony abnormality. IMPRESSION: No active cardiopulmonary disease. Electronically Signed   By: Franky Crease M.D.   On: 05/15/2024 02:56     .Critical Care  Performed by: Ruthell Lonni FALCON, PA-C Authorized by: Ruthell Lonni FALCON, PA-C   Critical care provider statement:    Critical care time (minutes):  55   Critical care time was exclusive of:  Separately billable procedures and treating other patients   Critical care was necessary to treat or prevent imminent or life-threatening deterioration of the following conditions:  Respiratory failure   Critical care was time spent personally by me on the following activities:  Blood draw for specimens, discussions with consultants, development of treatment plan with patient or surrogate, discussions with primary provider, evaluation of patient's response to treatment, examination of patient, interpretation of cardiac output measurements, obtaining history from patient or surrogate, transcutaneous pacing, review of old charts, pulse oximetry, re-evaluation of patient's condition, ordering and review of laboratory studies, ordering and review of radiographic studies and ordering and performing  treatments and interventions   I assumed direction of critical care for this patient from another provider in my specialty: no      Medications Ordered in the ED  ipratropium-albuterol  (DUONEB) 0.5-2.5 (3) MG/3ML nebulizer solution 3 mL (has no administration in time range)  albuterol  (PROVENTIL ) (2.5 MG/3ML) 0.083% nebulizer solution 2.5 mg (has no administration in time range)  doxycycline  (VIBRAMYCIN ) 100 mg in sodium chloride  0.9 % 250 mL IVPB (has no administration in time range)  ipratropium-albuterol  (DUONEB) 0.5-2.5 (3) MG/3ML nebulizer solution 3 mL (3 mLs Nebulization Given 05/15/24 0256)  methylPREDNISolone  sodium succinate (SOLU-MEDROL ) 125 mg/2 mL injection 125 mg (125 mg Intravenous Given 05/15/24 0256)  magnesium  sulfate IVPB 2 g 50 mL (0 g Intravenous Stopped 05/15/24 0407)  acetaminophen  (TYLENOL ) tablet 1,000 mg (1,000 mg Oral Given 05/15/24 0256)  albuterol  (PROVENTIL ) (2.5 MG/3ML) 0.083% nebulizer solution (10 mg/hr Nebulization Given 05/15/24 0411)     Medical Decision Making Amount and/or Complexity of Data Reviewed Labs: ordered. Radiology: ordered.  Risk OTC drugs. Prescription drug management. Decision regarding hospitalization.   This is a 73 year old male presenting to the ED with complaint of chest pain and shortness of breath.  On arrival, patient is tachycardic, afebrile.  Lung sounds are wheezing throughout, hypoxic on room air at 87%.  Abdomen soft and compressible.  Neuroexam at baseline.  No edema to bilateral lower extremities.  Patient received DuoNeb with EMS.  I have provided patient 125 Solu-Medrol , 1 DuoNeb and mag.  Will like labs to include CBC, BMP, i-STAT VBG, viral panel, troponin x 2, BNP, chest x-ray.  CBC with leukocytosis 21.6, no anemia.  Metabolic panel unremarkable.  Viral panel negative for all.  BNP one 1.4, chest x-ray shows no pleural effusions or other acute processes.  Patient troponin 30, delta pending.  EKG is nonischemic.  After  DuoNeb, patient reassessed and found to have continued wheezing.  Given continuous duo nebulizer at this time at 10 mg/h.  After continuous DuoNeb, patient was ambulated in oxygen saturation drops down to 86% and patient comes very dyspneic and lightheaded.  Will admit patient to the hospital at this time for COPD exacerbation.  Spoke with Dr. Debby of [facility] hospitalist was agreed to admit patient.  Patient amenable to plan.  Stable on admission.    Final diagnoses:  COPD exacerbation [facility])  Hypoxia    ED Discharge Orders     None          Ruthell Lonni JULIANNA DEVONNA 05/15/24 [id number]    Midge Golas, MD 05/15/24 [phone]

## 2024-05-15 NOTE — ED Triage Notes (Signed)
 Patient brought in by EMS from home with cough, shortness of breath and chest pain. 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 Patient noted with a nonproductive cough however patient stated he coughed up walk mucus earlier. Patient states his wife is at home sick. Patient has a extensive cardiac history,

## 2024-05-15 NOTE — ED Notes (Signed)
 Attempted to walk patient however patient became increasingly short of breath and stated he needed to turn around. O2 sats 85%. Patient back in bed O2 at 3L. PA made aware.

## 2024-05-15 NOTE — H&P (Addendum)
 History and Physical    Patient: Dennis Preston FMW:[id number] DOB: 10/13/1950 DOA: 05/15/2024 DOS: the patient was seen and examined on 05/15/2024 PCP: [name], MD  Patient coming from: Home via EMS  Chief Complaint:  Chief Complaint  Patient presents with   Shortness of Breath        Chest Pain   HPI: Dennis Preston is a 73 y.o. male with medical history significant of hypertension, hyperlipidemia, atrial fibrillation/flutter on Coumadin , heart failure with preserved ejection fraction, CAD s/p CABG, PAD, COPD, depression, and tobacco abuse presents with shortness of breath and chest pain.  He experienced shortness of breath and chest pain that began yesterday after helping to work on a truck with his neighbor. The symptoms started around 1 PM, approximately an hour after jacking up the truck. His breathing worsened, and he began coughing, producing clear sputum. He experienced wheezing, particularly bad last night, and used his inhalers without relief. He uses Trelegy every morning and has been doing well recently without problems until this episode.  He denies chills, although he felt subjectively feverish. He reports chest pain across his chest and thinks it may be related to using muscles while jacking the truck that he had not used in some time. The pain was across his chest. Not tender to touch and is not reproduced by arm movement.  No weight changes or leg swelling, although he mentions not gaining weight despite eating. He is able to sit up and does not use any assistive devices for mobility.  He has a history of smoking but quit in May after a procedure for atrial fibrillation.  He does not consume alcohol. His wife is currently sick with flu-like symptoms, but he believes her illness is unrelated to his current condition.  He normally uses oxygen at night at two liters and does not use CPAP or BiPAP.  In the emergency department patient was noted to be afebrile  with respirations elevated up to 32, heart rates elevated up to 103, blood pressures 108/51 - 146/61, and O2 saturations as low as 89% with improvement with O2 saturations greater than 92% on 3 L of nasal cannula oxygen. Labs significant for WBC 21.6, glucose 144, INR 1.9, BNP 101.4, and high-sensitivity troponins 38->28.  Venous blood gas noted pCO2 to be 58.5.  Influenza, COVID-19, and RSV screening were negative.  Chest x-ray showed no acute abnormality.  Attempts to ambulate the patient resulted in him becoming extremely short of breath with O2 saturations dropping as low as 85% patient had been given DuoNeb breathing treatment, Solu-Medrol  125 mg IV, 2 g of magnesium  sulfate, acetaminophen  1000 mg p.o., continuous albuterol  breathing treatment. , Review of Systems: As mentioned in the history of present illness. All other systems reviewed and are negative. Past Medical History:  Diagnosis Date   Acute diastolic heart failure (HCC) 09/14/2022   Acute metabolic encephalopathy 09/12/2022   Acute pulmonary edema (HCC)    Acute respiratory failure with hypoxia and hypercarbia (HCC)    AKI (acute kidney injury) (HCC) 08/25/2023   Alcohol abuse    Amaurosis fugax    Aortic atherosclerosis (HCC)    Atrial fibrillation (HCC)    Atrial flutter (HCC)    Carotid stenosis    CHF (congestive heart failure) (HCC)    Coagulopathy (HCC)    COPD (chronic obstructive pulmonary disease) (HCC)    Coronary artery disease    Depression    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, SVG-OM1, SVG-PDA, SVG-PL  Hyperlipemia    Hypertension    Hypotension 07/14/2015   Leucocytosis    Liver dysfunction    Long term current use of clopidogrel     Non-ST elevation (NSTEMI) myocardial infarction (HCC) 07/10/2015   NSTEMI (non-ST elevated myocardial infarction) (HCC)    approx 2016   Peripheral artery disease (HCC)    legs   Tobacco abuse    Wears dentures    full upper and lower   Past Surgical History:  Procedure  Laterality Date   A-FLUTTER ABLATION N/A 02/18/2024   Procedure: A-FLUTTER ABLATION;  Surgeon: Kennyth Chew, MD;  Location: [facility] INVASIVE CV LAB;  Service: Cardiovascular;  Laterality: N/A;   APPLICATION OF WOUND VAC Right 06/22/2020   Procedure: APPLICATION OF WOUND VAC;  Surgeon: Marea Selinda RAMAN, MD;  Location: [facility] ORS;  Service: Vascular;  Laterality: Right;   ATRIAL FIBRILLATION ABLATION N/A 02/18/2024   Procedure: ATRIAL FIBRILLATION ABLATION;  Surgeon: Kennyth Chew, MD;  Location: [facility] INVASIVE CV LAB;  Service: Cardiovascular;  Laterality: N/A;   CARDIAC CATHETERIZATION N/A 07/10/2015   Procedure: Right/Left Heart Cath and Coronary Angiography;  Surgeon: Cara JONETTA Lovelace, MD;  Location: [facility] INVASIVE CV LAB;  Service: Cardiovascular;  Laterality: N/A;   CARDIOVERSION N/A 10/14/2022   Procedure: CARDIOVERSION;  Surgeon: Lovelace Cara JONETTA, MD;  Location: [facility] ORS;  Service: Cardiovascular;  Laterality: N/A;   CARDIOVERSION N/A 12/28/2023   Procedure: CARDIOVERSION;  Surgeon: Barbaraann Darryle Ned, MD;  Location: [facility] INVASIVE CV LAB;  Service: Cardiovascular;  Laterality: N/A;   CORONARY ARTERY BYPASS GRAFT  07/13/2015   [facility].  4 vessel   ENDARTERECTOMY Right 01/02/2021   Procedure: ENDARTERECTOMY CAROTID;  Surgeon: Marea Selinda RAMAN, MD;  Location: [facility] ORS;  Service: Vascular;  Laterality: Right;   ENDARTERECTOMY FEMORAL Bilateral 05/23/2020   Procedure: ENDARTERECTOMY FEMORAL BILATERAL SUPERFICIAL FEMORAL ARTERY STENTS ;  Surgeon: Jama Cordella MATSU, MD;  Location: [facility] ORS;  Service: Vascular;  Laterality: Bilateral;   FALSE ANEURYSM REPAIR Left 07/22/2020   Procedure: REPAIR LEFT FEMORAL PSEUDOANUERYSM;  Surgeon: Tisa Curry LABOR, MD;  Location: [facility] ORS;  Service: Vascular;  Laterality: Left;   INSERTION OF ILIAC STENT Left 05/23/2020   Procedure: INSERTION OF ILIAC STENT;  Surgeon: Jama Cordella MATSU, MD;  Location: [facility] ORS;  Service: Vascular;  Laterality: Left;   LEFT HEART CATH AND CORONARY  ANGIOGRAPHY N/A 09/16/2022   Procedure: LEFT HEART CATH AND CORONARY ANGIOGRAPHY;  Surgeon: Burnard Ned LABOR, MD;  Location: [facility] INVASIVE CV LAB;  Service: Cardiovascular;  Laterality: N/A;   LEFT HEART CATH AND CORS/GRAFTS ANGIOGRAPHY N/A 01/23/2020   Procedure: LEFT HEART CATH AND CORS/GRAFTS ANGIOGRAPHY;  Surgeon: Lovelace Cara JONETTA, MD;  Location: [facility] INVASIVE CV LAB;  Service: Cardiovascular;  Laterality: N/A;   LEFT HEART CATH AND CORS/GRAFTS ANGIOGRAPHY N/A 02/18/2022   Procedure: LEFT HEART CATH AND CORS/GRAFTS ANGIOGRAPHY;  Surgeon: Lawyer Bernardino Cough, MD;  Location: [facility] INVASIVE CV LAB;  Service: Cardiovascular;  Laterality: N/A;   LOWER EXTREMITY ANGIOGRAPHY Right 04/03/2020   Procedure: LOWER EXTREMITY ANGIOGRAPHY;  Surgeon: Jama Cordella MATSU, MD;  Location: [facility] INVASIVE CV LAB;  Service: Cardiovascular;  Laterality: Right;   OLECRANON BURSECTOMY Left 05/06/2019   Procedure: OLECRANON BURSECTOMY AND DEBRIDEMENT;  Surgeon: Tobie Priest, MD;  Location: [facility] ORS;  Service: Orthopedics;  Laterality: Left;   TRANSESOPHAGEAL ECHOCARDIOGRAM (CATH LAB) N/A 12/28/2023   Procedure: TRANSESOPHAGEAL ECHOCARDIOGRAM;  Surgeon: Barbaraann Darryle Ned, MD;  Location: [facility] INVASIVE CV LAB;  Service: Cardiovascular;  Laterality: N/A;   WOUND DEBRIDEMENT  Right 06/22/2020   Procedure: DEBRIDEMENT WOUND RIGHT GROIN;  Surgeon: Marea Selinda RAMAN, MD;  Location: [facility] ORS;  Service: Vascular;  Laterality: Right;   Social History:  reports that he has quit smoking. His smoking use included cigarettes. He has a 135 pack-year smoking history. He has never used smokeless tobacco. He reports that he does not currently use alcohol. He reports that he does not use drugs.  Allergies  Allergen Reactions   Aspirin  Swelling    REACTION: reaction not known    Family History  Problem Relation Age of Onset   Depression Sister     Prior to Admission medications   Medication Sig Start Date End Date Taking? Authorizing Provider   acetaminophen  (TYLENOL ) 325 MG tablet Take 2 tablets (650 mg total) by mouth every 4 (four) hours as needed for headache or mild pain (pain score 1-3). 02/19/24  Yes Lesia Ozell Barter, PA-C  atorvastatin  (LIPITOR ) 80 MG tablet Take 1 tablet (80 mg total) by mouth daily at 6 PM. 07/10/15  Yes Sainani, Vivek J, MD  clopidogrel  (PLAVIX ) 75 MG tablet Take 75 mg by mouth in the morning. 04/18/16  Yes [provider]  diazepam  (VALIUM ) 5 MG tablet Take 1 tablet (5 mg total) by mouth at bedtime. 08/26/23  Yes Joshua Domino, DO  diltiazem  (CARDIZEM  CD) 180 MG 24 hr capsule Take 1 capsule (180 mg total) by mouth in the morning. 02/19/24  Yes Tillery, Ozell Barter, PA-C  Fluticasone -Umeclidin-Vilant (TRELEGY ELLIPTA) 100-62.5-25 MCG/INH AEPB Inhale 1 puff into the lungs in the morning. 01/27/20  Yes [provider]  furosemide  (LASIX ) 40 MG tablet Take 40 mg in the morning and 20 mg (1/2 tablet) in the evening Patient taking differently: Take 60 mg by mouth every morning. 08/26/23  Yes Joshua Domino, DO  levalbuterol  (XOPENEX ) 1.25 MG/3ML nebulizer solution Take 1.25 mg by nebulization every 6 (six) hours as needed for wheezing or shortness of breath. 12/28/23  Yes Rai, Ripudeep K, MD  OXYGEN Inhale 2 L into the lungs at bedtime. Uses while sleeping   Yes [provider]  spironolactone  (ALDACTONE ) 25 MG tablet Take 0.5 tablets (12.5 mg total) by mouth daily. 11/26/23  Yes Rai, Ripudeep K, MD  warfarin (COUMADIN ) 1 MG tablet Take 1.5 mg by mouth See admin instructions. Take 1.5 tablets (1.5mg ) by mouth at bedtime on Saturday and Sunday 11/10/23  Yes [provider]  warfarin (COUMADIN ) 2 MG tablet Take 2 mg by mouth See admin instructions. Take 1 tablet (2mg ) by mouth at bedtime Monday - Friday.   Yes [provider]  colchicine 0.6 MG tablet Take 0.6 mg by mouth daily as needed (gout flare). Patient not taking: Reported on 05/15/2024    [provider]     Physical Exam: Vitals:   05/15/24 0545 05/15/24 0600 05/15/24 0630 05/15/24 0641  BP: (!) 136/54 (!) 112/51 (!) 120/50   Pulse: 89 92 87   Resp: (!) 26 (!) 25 (!) 32   Temp:    98.7 F (37.1 C)  TempSrc:    Oral  SpO2: 100% 98% 100%   Weight:      Height:       Constitutional: Elderly male who appears to be in some respiratory distress Eyes: Wearing glasses.  Lids and conjunctivae normal ENMT: Mucous membranes are moist.  Fair dentition Neck: normal, supple, minimal JVD present. Respiratory: Decreased aeration with expiratory wheezes and rhonchi appreciated.  Currently on 3 L nasal cannula oxygen talking in almost  complete sentences. Cardiovascular: Regular rate and rhythm.  No extremity edema. 2+ pedal pulses.   Abdomen: no tenderness, no masses palpated.   Bowel sounds positive.  Musculoskeletal: no clubbing / cyanosis. No joint deformity upper and lower extremities.  .  Skin: Bruising white discoloration of the bilateral forearms Neurologic: CN 2-12 grossly intact.   Strength 4+/5 in all 4.  Psychiatric: Normal judgment and insight. Alert and oriented x 3. Normal mood.   Data Reviewed:  Initial EKG reveals sinus tachycardia at 101 bpm with RBBB and LPFB with QTc 498 that appears new under previous tracings.  Subsequent EKG revealed similar sinus tachycardia but noted to have PVCs.  Assessment and Plan:  Acute respiratory failure with hypoxia secondary to COPD exacerbation Patient presented with shortness of breath and cough.  On physical exam patient noted to have wheezing and some rhonchi appreciated.  O2 saturations documented to be as low as 85% with ambulation.  He is on 3 L of oxygen with O2 saturations maintained above 90%, but normally only uses 2 L of nasal cannula oxygen at night to sleep.  Chest x-ray was otherwise clear.  Notes exposure to his body for wife who has a upper respiratory infection that he thought was possibly the flu.  Influenza, COVID-19, and RSV  screening were negative.  Patient had been given DuoNeb breathing treatment, continuous albuterol  treatment, Solu-Medrol  125 mg IV, and 2 g of magnesium  sulfate, and doxycycline  IV with persistence of symptoms. - Admit to a telemetry bed - Continuous pulse oximetry with oxygen maintain O2 saturations greater than 90% - Incentive spirometry and flutter valve - Check procalcitonin and complete respiratory virus panel - DuoNebs scheduled 4 times daily and albuterol  nebs as needed - Substituted  Brovana  and budesonide  nebs twice daily for home maintenance inhaler in the acute setting - Prednisone  - Add-on Rocephin  and continue doxycycline  given prolonged QT. Consider de-escalate to just doxycycline    - Mucinex  - PT to evaluate and treat   Chest pain Elevated troponin Acute.  Patient presented with complaints of chest pain after helping his neighbor jack up a truck.  High-sensitivity troponins 38->28.  EKG did not show any significant ischemic changes.  Downtrending elevation in troponin may be secondary to demand due to respiratory distress. - Check D-dimer with subtherapeutic INR - Continue to monitor  SIRS Acute.  Patient was noted to be tachypneic, mildly tachycardic, and have white blood cell count elevated up to 21.6.  Question the possibility of infection  or that this may be just reactive to above.    - Follow-up procalcitonin and CRP Empiric antibiotics as noted above-   Essential hypertension Blood pressures initially noted to be soft as low as 108/51, but currently noted to be 132/81. - Continue blood pressure regimen as tolerated  Paroxysmal atrial fibrillation/flutter on chronic anticoagulation Subtherapeutic INR Patient appears to be in sinus rhythm at this time.  INR noted to be mildly subtherapeutic at 1.9. - Continue diltiazem  as tolerated - Coumadin  per pharmacy  Heart failure with preserved EF Chronic.  Patient does not appear to be fairly euvolemic with minimal JVD  seen and no significant lower extremity edema on physical exam.  BNP is mildly elevated at 101.4.  Last echocardiogram noted EF to be 55 to 60% with normal RV function when checked 11/2023. - Daily weights - Continue furosemide  and spironolactone   CAD s/p CABG Peripheral vascular disease Patient with prior history of CABG and carotid endarterectomy. - Continue Plavix  and statin  Prolonged  QT interval QTc noted to be 501 on last EKG. - Correcting any electrolyte abnormalities - Avoiding further QT prolonging medications  Anxiety - Continue valium   Hyperlipidemia - Continue atorvastatin   Tobacco abuse Patient reports no longer smoking or vaping since June. - Encouraged continued cessation of tobacco and vape use  DVT prophylaxis: Coumadin  Advance Care Planning:   Code Status: Full Code    Consults: None  Family Communication: Family updated at bedside  Severity of Illness: The appropriate patient status for this patient is INPATIENT. Inpatient status is judged to be reasonable and necessary in order to provide the required intensity of service to ensure the patient's safety. The patient's presenting symptoms, physical exam findings, and initial radiographic and laboratory data in the context of their chronic comorbidities is felt to place them at high risk for further clinical deterioration. Furthermore, it is not anticipated that the patient will be medically stable for discharge from the hospital within 2 midnights of admission.   * I certify that at the point of admission it is my clinical judgment that the patient will require inpatient hospital care spanning beyond 2 midnights from the point of admission due to high intensity of service, high risk for further deterioration and high frequency of surveillance required.*  Author: Maximino DELENA Sharps, MD 05/15/2024 7:35 AM  For on call review [email or web address].

## 2024-05-15 NOTE — ED Notes (Signed)
 Breakfast tray ordered for the patient.  It should arrive within the hour.

## 2024-05-16 DIAGNOSIS — J441 Chronic obstructive pulmonary disease with (acute) exacerbation: Secondary | ICD-10-CM | POA: Diagnosis not present

## 2024-05-16 DIAGNOSIS — R7989 Other specified abnormal findings of blood chemistry: Secondary | ICD-10-CM | POA: Diagnosis not present

## 2024-05-16 DIAGNOSIS — J9621 Acute and chronic respiratory failure with hypoxia: Secondary | ICD-10-CM | POA: Diagnosis not present

## 2024-05-16 DIAGNOSIS — R9431 Abnormal electrocardiogram [ECG] [EKG]: Secondary | ICD-10-CM | POA: Diagnosis not present

## 2024-05-16 LAB — CBC
HCT: 44.4 % (ref 39.0–52.0)
Hemoglobin: 14.6 g/dL (ref 13.0–17.0)
MCH: 30.3 pg (ref 26.0–34.0)
MCHC: 32.9 g/dL (ref 30.0–36.0)
MCV: 92.1 fL (ref 80.0–100.0)
Platelets: 200 K/uL (ref 150–400)
RBC: 4.82 MIL/uL (ref 4.22–5.81)
RDW: 14.4 % (ref 11.5–15.5)
WBC: 19.3 K/uL — ABNORMAL HIGH (ref 4.0–10.5)
nRBC: 0 % (ref 0.0–0.2)

## 2024-05-16 LAB — BASIC METABOLIC PANEL WITH GFR
Anion gap: 7 (ref 5–15)
BUN: 15 mg/dL (ref 8–23)
CO2: 35 mmol/L — ABNORMAL HIGH (ref 22–32)
Calcium: 8.9 mg/dL (ref 8.9–10.3)
Chloride: 98 mmol/L (ref 98–111)
Creatinine, Ser: 0.95 mg/dL (ref 0.61–1.24)
GFR, Estimated: >60 mL/min (ref >60)
Glucose, Bld: 136 mg/dL — ABNORMAL HIGH (ref 70–99)
Potassium: 4.4 mmol/L (ref 3.5–5.1)
Sodium: 140 mmol/L (ref 135–145)

## 2024-05-16 LAB — PROTIME-INR
INR: 2.4 — ABNORMAL HIGH (ref 0.8–1.2)
Prothrombin Time: 27.6 s — ABNORMAL HIGH (ref 11.4–15.2)

## 2024-05-16 MED ORDER — PANTOPRAZOLE SODIUM 40 MG PO TBEC
40.0000 mg | DELAYED_RELEASE_TABLET | Freq: Two times a day (BID) | ORAL | Status: DC
  Administered 2024-05-16 – 2024-05-18 (×4): 40 mg via ORAL
  Filled 2024-05-16 (×4): qty 1

## 2024-05-16 MED ORDER — IPRATROPIUM-ALBUTEROL 0.5-2.5 (3) MG/3ML IN SOLN
3.0000 mL | Freq: Two times a day (BID) | RESPIRATORY_TRACT | Status: DC
  Filled 2024-05-16: qty 3

## 2024-05-16 NOTE — Evaluation (Signed)
 Physical Therapy Evaluation Patient Details Name: Dennis Preston MRN: [id number] DOB: 1951-06-27 Today's Date: 05/16/2024  History of Present Illness  Patient is a 73 y.o.  male presented with shortness of breath-found to have COPD exacerbation. Past medical history of COPD on nocturnal home O2 2 L, A-fib, HFpEF, CAD s/p CABG.  Clinical Impression  Pt presents with admitting diagnosis above. Pt today was able to ambulate in hallway with RW at supervision level. VSS on 3L. PTA pt reports he was mostly ind with no AD however would use electric scooter for longer distances at times. Pt appears to be very close to baseline and will likely not need any follow up PT upon DC. PT will continue to follow acutely.         If plan is discharge home, recommend the following: A little help with walking and/or transfers;A little help with bathing/dressing/bathroom;Assistance with cooking/housework;Direct supervision/assist for medications management;Assist for transportation;Help with stairs or ramp for entrance   Can travel by private vehicle        Equipment Recommendations None recommended by PT  Recommendations for Other Services       Functional Status Assessment Patient has had a recent decline in their functional status and demonstrates the ability to make significant improvements in function in a reasonable and predictable amount of time.     Precautions / Restrictions Precautions Precautions: Fall Restrictions Weight Bearing Restrictions Per Provider Order: No      Mobility  Bed Mobility Overal bed mobility: Needs Assistance Bed Mobility: Supine to Sit     Supine to sit: Supervision     General bed mobility comments: No physical assistance needed    Transfers Overall transfer level: Needs assistance Equipment used: None Transfers: Sit to/from Stand Sit to Stand: Supervision           General transfer comment: no physical assistance needed.     Ambulation/Gait Ambulation/Gait assistance: Supervision Gait Distance (Feet): 250 Feet Assistive device: Rolling walker (2 wheels) Gait Pattern/deviations: WFL(Within Functional Limits)   Gait velocity interpretation: >2.62 ft/sec, indicative of community ambulatory   General Gait Details: no LOB noted. VSS on 3L  Stairs            Wheelchair Mobility     Tilt Bed    Modified Rankin (Stroke Patients Only)       Balance Overall balance assessment: Mild deficits observed, not formally tested                                           Pertinent Vitals/Pain Pain Assessment Pain Assessment: No/denies pain    Home Living Family/patient expects to be discharged to:: Private residence Living Arrangements: Spouse/significant other Available Help at Discharge: Family;Available 24 hours/day Type of Home: Mobile home Home Access: Stairs to enter Entrance Stairs-Rails: Can reach both Entrance Stairs-Number of Steps: 4   Home Layout: One level Home Equipment: Insurance risk surveyor (2 wheels);Shower seat - built in;Grab bars - tub/shower Additional Comments: Pt reports using 2L of O2 at night and using electric scooter for longer distances.    Prior Function Prior Level of Function : Independent/Modified Independent;Driving             Mobility Comments: mostly no AD however pt reports using electric scooter at times for longer distances ADLs Comments: Ind     Extremity/Trunk Assessment   Upper Extremity Assessment Upper Extremity  Assessment: Overall WFL for tasks assessed    Lower Extremity Assessment Lower Extremity Assessment: Overall WFL for tasks assessed    Cervical / Trunk Assessment Cervical / Trunk Assessment: Normal  Communication   Communication Communication: No apparent difficulties    Cognition Arousal: Alert Behavior During Therapy: WFL for tasks assessed/performed   PT - Cognitive impairments: No apparent  impairments                         Following commands: Intact       Cueing Cueing Techniques: Verbal cues, Tactile cues     General Comments General comments (skin integrity, edema, etc.): VSS on 3L    Exercises     Assessment/Plan    PT Assessment Patient needs continued PT services  PT Problem List Decreased strength;Decreased range of motion;Decreased activity tolerance;Decreased balance;Decreased coordination;Decreased mobility;Decreased knowledge of use of DME;Decreased safety awareness;Decreased knowledge of precautions;Cardiopulmonary status limiting activity       PT Treatment Interventions DME instruction;Gait training;Stair training;Therapeutic activities;Functional mobility training;Therapeutic exercise;Balance training;Neuromuscular re-education;Patient/family education;Wheelchair mobility training    PT Goals (Current goals can be found in the Care Plan section)  Acute Rehab PT Goals Patient Stated Goal: to go home PT Goal Formulation: With patient Time For Goal Achievement: 05/30/24 Potential to Achieve Goals: Good    Frequency Min 1X/week     Co-evaluation               AM-PAC PT 6 Clicks Mobility  Outcome Measure Help needed turning from your back to your side while in a flat bed without using bedrails?: None Help needed moving from lying on your back to sitting on the side of a flat bed without using bedrails?: A Little Help needed moving to and from a bed to a chair (including a wheelchair)?: A Little Help needed standing up from a chair using your arms (e.g., wheelchair or bedside chair)?: A Little Help needed to walk in hospital room?: A Little Help needed climbing 3-5 steps with a railing? : A Little 6 Click Score: 19    End of Session Equipment Utilized During Treatment: Gait belt;Oxygen Activity Tolerance: Patient tolerated treatment well Patient left: in chair;with call bell/phone within reach;with chair alarm set Nurse  Communication: Mobility status PT Visit Diagnosis: Other abnormalities of gait and mobility (R26.89)    Time: 8945-8873 PT Time Calculation (min) (ACUTE ONLY): 32 min   Charges:   PT Evaluation $PT Eval Moderate Complexity: 1 Mod PT Treatments $Gait Training: 8-22 mins PT General Charges $$ ACUTE PT VISIT: 1 Visit         Sueellen NOVAK, PT, DPT Acute Rehab Services [phone]   Jamichael Knotts 05/16/2024, 3:50 PM

## 2024-05-16 NOTE — Plan of Care (Signed)

## 2024-05-16 NOTE — Progress Notes (Signed)
 PROGRESS NOTE        PATIENT DETAILS Name: Dennis Preston Age: 73 y.o. Sex: male Date of Birth: May 19, 1951 Admit Date: 05/15/2024 Admitting Physician Camila DELENA Ned, MD ERE:Yjwiz, Tamra, MD  Brief Summary: Patient is a 72 y.o.  male with history of COPD on nocturnal home O2 2 L, A-fib, HFpEF, CAD s/p CABG-presented with shortness of breath-found to have COPD exacerbation.  Significant events: 8/17>> admit to [facility]  Significant studies: 8/17>> CXR: No PNA  Significant microbiology data: 8/17>> COVID/influenza/RSV PCR: Negative 8/17>> respiratory virus panel: Negative  Procedures: None  Consults: None  Subjective: Feels better-complains of heartburn.  Objective: Vitals: Blood pressure (P) 126/60, pulse 62, temperature (P) 97.6 F (36.4 C), temperature source (P) Oral, resp. rate 18, height 5' 11 (1.803 m), weight 77.8 kg, SpO2 99%.   Exam: Gen Exam:Alert awake-not in any distress HEENT:atraumatic, normocephalic Chest: B/L clear to auscultation anteriorly CVS:S1S2 regular Abdomen:soft non tender, non distended Extremities:no edema Neurology: Non focal Skin: no rash  Pertinent Labs/Radiology:    Latest Ref Rng & Units 05/16/2024    5:00 AM 05/15/2024    3:18 AM 05/15/2024    2:34 AM  CBC  WBC 4.0 - 10.5 K/uL 19.3   21.6   Hemoglobin 13.0 - 17.0 g/dL 85.3  84.3  84.9   Hematocrit 39.0 - 52.0 % 44.4  46.0  45.3   Platelets 150 - 400 K/uL 200   195     Lab Results  Component Value Date   NA 140 05/16/2024   K 4.4 05/16/2024   CL 98 05/16/2024   CO2 35 (H) 05/16/2024      Assessment/Plan: Acute on chronic hypoxic respiratory failure secondary to COPD exacerbation (on home O2 nocturnally) Overall improved Still requiring 2-3 L of oxygen 24/7  Continue steroids/bronchodilators/empiric antibiotics (significant leukocytosis even prior to steroids being started) Follow/optimize Attempt to titrate off oxygen.  Chest  pain Atypical and related to COPD/reflux-currently complains of reflux. Troponins are minimally elevated and unlikely of any significance Recent echo March 2025 with stable EF.  GERD Start PPI As needed Maalox  Leukocytosis Check procalcitonin Repeat two-view chest x-ray Continue empiric antibiotics-concern for pneumonia not seen on initial chest x-ray.  Chronic HFpEF Euvolemic Continue Lasix /Aldactone .  CAD s/p CABG Has reflux but no obvious anginal symptoms Continue Plavix //statin  Prolonged QTc Repeat twelve-lead EKG in a.m. Keep K> 4, Mg> 2 Avoid QTc prolonging agents.  PAF s/p ablation May 2025 Sinus rhythm Coumadin  per pharmacy Telemetry monitoring  PAD Stable Plavix /statin  HLD Statin  Anxiety Valium   Tobacco abuse Quit tobacco since June of this year.   Code status:   Code Status: Full Code   DVT Prophylaxis:  warfarin (COUMADIN ) tablet 2 mg  warfarin (COUMADIN ) tablet 1.5 mg    Family Communication: None at bedside   Disposition Plan: Status is: Inpatient Remains inpatient appropriate because: Severity of illness   Planned Discharge Destination:Home health   Diet: Diet Order             Diet Heart Room service appropriate? Yes; Fluid consistency: Thin  Diet effective now                     Antimicrobial agents: Anti-infectives (From admission, onward)    Start     Dose/Rate Route Frequency Ordered Stop   05/15/24 0900  cefTRIAXone  (ROCEPHIN ) 2 g in sodium chloride  0.9 % 100 mL IVPB        2 g 200 mL/hr over 30 Minutes Intravenous Every 24 hours 05/15/24 0848     05/15/24 0700  doxycycline  (VIBRAMYCIN ) 100 mg in sodium chloride  0.9 % 250 mL IVPB        100 mg 125 mL/hr over 120 Minutes Intravenous Every 12 hours 05/15/24 0640          MEDICATIONS: Scheduled Meds:  arformoterol   15 mcg Nebulization BID   atorvastatin   80 mg Oral q1800   budesonide  (PULMICORT ) nebulizer solution  0.5 mg Nebulization BID    clopidogrel   75 mg Oral q AM   diazepam   5 mg Oral QHS   diltiazem   180 mg Oral q AM   furosemide   60 mg Oral q morning   guaiFENesin   600 mg Oral BID   ipratropium-albuterol   3 mL Nebulization BID   pantoprazole   40 mg Oral BID AC   predniSONE   40 mg Oral Q breakfast   sodium chloride  flush  3 mL Intravenous Q12H   spironolactone   12.5 mg Oral Daily   [START ON 05/21/2024] warfarin  1.5 mg Oral Once per day on Sunday Saturday   warfarin  2 mg Oral Once per day on Monday Tuesday Wednesday Thursday Friday   Warfarin - Pharmacist Dosing Inpatient   Does not apply q1600   Continuous Infusions:  cefTRIAXone  (ROCEPHIN )  IV 2 g (05/16/24 0906)   doxycycline  (VIBRAMYCIN ) IV 100 mg (05/16/24 0617)   PRN Meds:.acetaminophen  **OR** acetaminophen , albuterol , alum & mag hydroxide-simeth, diazepam , trimethobenzamide    I have personally reviewed following labs and imaging studies  LABORATORY DATA: CBC: Recent Labs  Lab 05/15/24 0234 05/15/24 0318 05/16/24 0500  WBC 21.6*  --  19.3*  HGB 15.0 15.6 14.6  HCT 45.3 46.0 44.4  MCV 92.6  --  92.1  PLT 195  --  200    Basic Metabolic Panel: Recent Labs  Lab 05/15/24 0234 05/15/24 0318 05/16/24 0500  NA 137 138 140  K 3.6 3.7 4.4  CL 98  --  98  CO2 30  --  35*  GLUCOSE 144*  --  136*  BUN 11  --  15  CREATININE 1.08  --  0.95  CALCIUM  9.2  --  8.9    GFR: Estimated Creatinine Clearance: 74.9 mL/min (by C-G formula based on SCr of 0.95 mg/dL).  Liver Function Tests: Recent Labs  Lab 05/15/24 1059  AST 27  ALT 15  ALKPHOS 76  BILITOT 0.7  PROT 6.3*  ALBUMIN  3.4*   No results for input(s): LIPASE, AMYLASE in the last 168 hours. No results for input(s): AMMONIA in the last 168 hours.  Coagulation Profile: Recent Labs  Lab 05/15/24 0234 05/16/24 0500  INR 1.9* 2.4*    Cardiac Enzymes: No results for input(s): CKTOTAL, CKMB, CKMBINDEX, TROPONINI in the last 168 hours.  BNP (last 3 results) No  results for input(s): PROBNP in the last 8760 hours.  Lipid Profile: No results for input(s): CHOL, HDL, LDLCALC, TRIG, CHOLHDL, LDLDIRECT in the last 72 hours.  Thyroid  Function Tests: No results for input(s): TSH, T4TOTAL, FREET4, T3FREE, THYROIDAB in the last 72 hours.  Anemia Panel: No results for input(s): VITAMINB12, FOLATE, FERRITIN, TIBC, IRON, RETICCTPCT in the last 72 hours.  Urine analysis:    Component Value Date/Time   COLORURINE YELLOW 07/03/2022 0819   APPEARANCEUR CLEAR 07/03/2022 0819   LABSPEC 1.025 07/03/2022 9180  PHURINE 6.0 07/03/2022 0819   GLUCOSEU NEGATIVE 07/03/2022 0819   HGBUR TRACE (A) 07/03/2022 0819   BILIRUBINUR NEGATIVE 07/03/2022 0819   KETONESUR NEGATIVE 07/03/2022 0819   PROTEINUR 100 (A) 07/03/2022 0819   UROBILINOGEN 0.2 05/10/2008 1654   NITRITE NEGATIVE 07/03/2022 0819   LEUKOCYTESUR NEGATIVE 07/03/2022 0819    Sepsis Labs: Lactic Acid, Venous    Component Value Date/Time   LATICACIDVEN 1.2 09/12/2022 1353    MICROBIOLOGY: Recent Results (from the past 240 hours)  Resp panel by RT-PCR (RSV, Flu A&B, Covid) Anterior Nasal Swab     Status: None   Collection Time: 05/15/24  2:50 AM   Specimen: Anterior Nasal Swab  Result Value Ref Range Status   SARS Coronavirus 2 by RT PCR NEGATIVE NEGATIVE Final   Influenza A by PCR NEGATIVE NEGATIVE Final   Influenza B by PCR NEGATIVE NEGATIVE Final    Comment: (NOTE) The Xpert Xpress SARS-CoV-2/FLU/RSV plus assay is intended as an aid in the diagnosis of influenza from Nasopharyngeal swab specimens and should not be used as a sole basis for treatment. Nasal washings and aspirates are unacceptable for Xpert Xpress SARS-CoV-2/FLU/RSV testing.  Fact Sheet for Patients: BloggerCourse.com  Fact Sheet for Healthcare Providers: SeriousBroker.it  This test is not yet approved or cleared by the [place]   FDA and has been authorized for detection and/or diagnosis of SARS-CoV-2 by FDA under an Emergency Use Authorization (EUA). This EUA will remain in effect (meaning this test can be used) for the duration of the COVID-19 declaration under Section 564(b)(1) of the Act, 21 U.S.C. section 360bbb-3(b)(1), unless the authorization is terminated or revoked.     Resp Syncytial Virus by PCR NEGATIVE NEGATIVE Final    Comment: (NOTE) Fact Sheet for Patients: BloggerCourse.com  Fact Sheet for Healthcare Providers: SeriousBroker.it  This test is not yet approved or cleared by the [place]  FDA and has been authorized for detection and/or diagnosis of SARS-CoV-2 by FDA under an Emergency Use Authorization (EUA). This EUA will remain in effect (meaning this test can be used) for the duration of the COVID-19 declaration under Section 564(b)(1) of the Act, 21 U.S.C. section 360bbb-3(b)(1), unless the authorization is terminated or revoked.  Performed at [facility], [address], [place], [place] [address]   Respiratory (~20 pathogens) panel by PCR     Status: None   Collection Time: 05/15/24  7:51 PM   Specimen: Nasopharyngeal Swab; Respiratory  Result Value Ref Range Status   Adenovirus NOT DETECTED NOT DETECTED Final   Coronavirus 229E NOT DETECTED NOT DETECTED Final    Comment: (NOTE) The Coronavirus on the Respiratory Panel, DOES NOT test for the novel  Coronavirus (2019 nCoV)    Coronavirus HKU1 NOT DETECTED NOT DETECTED Final   Coronavirus NL63 NOT DETECTED NOT DETECTED Final   Coronavirus OC43 NOT DETECTED NOT DETECTED Final   Metapneumovirus NOT DETECTED NOT DETECTED Final   Rhinovirus / Enterovirus NOT DETECTED NOT DETECTED Final   Influenza A NOT DETECTED NOT DETECTED Final   Influenza B NOT DETECTED NOT DETECTED Final   Parainfluenza Virus 1 NOT DETECTED NOT DETECTED Final   Parainfluenza Virus 2 NOT DETECTED NOT  DETECTED Final   Parainfluenza Virus 3 NOT DETECTED NOT DETECTED Final   Parainfluenza Virus 4 NOT DETECTED NOT DETECTED Final   Respiratory Syncytial Virus NOT DETECTED NOT DETECTED Final   Bordetella pertussis NOT DETECTED NOT DETECTED Final   Bordetella Parapertussis NOT DETECTED NOT DETECTED Final   Chlamydophila  pneumoniae NOT DETECTED NOT DETECTED Final   Mycoplasma pneumoniae NOT DETECTED NOT DETECTED Final    Comment: Performed at [facility], [address], [place], [place] [address]    RADIOLOGY STUDIES/RESULTS: DG Chest Portable 1 View Result Date: 05/15/2024 CLINICAL DATA:  Shortness of breath, chest pain, cough EXAM: PORTABLE CHEST 1 VIEW COMPARISON:  03/11/2024 FINDINGS: Prior CABG. Heart and mediastinal contours are within normal limits. No focal opacities or effusions. No acute bony abnormality. IMPRESSION: No active cardiopulmonary disease. Electronically Signed   By: Franky Crease M.D.   On: 05/15/2024 02:56     LOS: 1 day   Donalda Applebaum, MD  [facility]    To contact the attending provider between 7A-7P or the covering provider during after hours 7P-7A, please log into the web site [email or web address] and access using universal Santa [place] password for that web site. If you do not have the password, please call the hospital operator.  05/16/2024, 10:08 AM

## 2024-05-16 NOTE — TOC CM/SW Note (Signed)
 Transition of Care [facility]) - Inpatient Brief Assessment   Patient Details  Name: RAMIRO PANGILINAN MRN: [id number] Date of Birth: [birth year]  Transition of Care [facility]) CM/SW Contact:    Lauraine FORBES Saa, LCSWA Phone Number: 05/16/2024, 3:04 PM   Clinical Narrative:  3:04 PM Per chart review, patient resides at home with spouse. Patient has a PCP and insurance. Patient does not have SNF history. Patient has HH history and DME (oxygen, cane, nebulizer) history with [facility]. No TOC needs were identified at this time. TOC will continue to follow and be available to assist.  Transition of Care Asessment: Insurance and Status: Insurance coverage has been reviewed Patient has primary care physician: Yes Home environment has been reviewed: Private Residence Prior level of function:: N/A Prior/Current Home Services: No current home services Social Drivers of Health Review: SDOH reviewed no interventions necessary Readmission risk has been reviewed: Yes (Currently Yellow 21%) Transition of care needs: no transition of care needs at this time

## 2024-05-16 NOTE — Progress Notes (Signed)
 PHARMACY - ANTICOAGULATION CONSULT NOTE  Pharmacy Consult for Warfarin  Indication: atrial fibrillation  Allergies  Allergen Reactions   Aspirin  Swelling    REACTION: reaction not known    Patient Measurements: Height: 5' 11 (180.3 cm) Weight: 77.8 kg (171 lb 8.3 oz) IBW/kg (Calculated) : 75.3 HEPARIN  DW (KG): 68  Vital Signs: Temp: 97.9 F (36.6 C) (08/18 0400) Temp Source: Oral (08/18 0400) BP: 160/60 (08/18 0617) Pulse Rate: 69 (08/18 0617)  Labs: Recent Labs    05/15/24 0234 05/15/24 0318 05/15/24 0633 05/15/24 1059 05/16/24 0500  HGB 15.0 15.6  --   --  14.6  HCT 45.3 46.0  --   --  44.4  PLT 195  --   --   --  200  LABPROT 22.9*  --   --   --  27.6*  INR 1.9*  --   --   --  2.4*  CREATININE 1.08  --   --   --  0.95  TROPONINIHS 30*  --  28* 23*  --     Estimated Creatinine Clearance: 74.9 mL/min (by C-G formula based on SCr of 0.95 mg/dL).   Medical History: Past Medical History:  Diagnosis Date   Acute diastolic heart failure (HCC) 09/14/2022   Acute metabolic encephalopathy 09/12/2022   Acute pulmonary edema (HCC)    Acute respiratory failure with hypoxia and hypercarbia (HCC)    AKI (acute kidney injury) (HCC) 08/25/2023   Alcohol abuse    Amaurosis fugax    Aortic atherosclerosis (HCC)    Atrial fibrillation (HCC)    Atrial flutter (HCC)    Carotid stenosis    CHF (congestive heart failure) (HCC)    Coagulopathy (HCC)    COPD (chronic obstructive pulmonary disease) (HCC)    Coronary artery disease    Depression    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, SVG-OM1, SVG-PDA, SVG-PL   Hyperlipemia    Hypertension    Hypotension 07/14/2015   Leucocytosis    Liver dysfunction    Long term current use of clopidogrel     Non-ST elevation (NSTEMI) myocardial infarction (HCC) 07/10/2015   NSTEMI (non-ST elevated myocardial infarction) (HCC)    approx 2016   Peripheral artery disease (HCC)    legs   Tobacco abuse    Wears dentures    full upper  and lower    Medications:  Medications Prior to Admission  Medication Sig Dispense Refill Last Dose/Taking   acetaminophen  (TYLENOL ) 325 MG tablet Take 2 tablets (650 mg total) by mouth every 4 (four) hours as needed for headache or mild pain (pain score 1-3).   Past Week   atorvastatin  (LIPITOR ) 80 MG tablet Take 1 tablet (80 mg total) by mouth daily at 6 PM.   05/14/2024 Evening   clopidogrel  (PLAVIX ) 75 MG tablet Take 75 mg by mouth in the morning.   05/14/2024 Morning   diazepam  (VALIUM ) 5 MG tablet Take 1 tablet (5 mg total) by mouth at bedtime.   05/14/2024 Bedtime   diltiazem  (CARDIZEM  CD) 180 MG 24 hr capsule Take 1 capsule (180 mg total) by mouth in the morning. 60 capsule 6 05/14/2024 Morning   Fluticasone -Umeclidin-Vilant (TRELEGY ELLIPTA) 100-62.5-25 MCG/INH AEPB Inhale 1 puff into the lungs in the morning.   05/14/2024 Morning   furosemide  (LASIX ) 40 MG tablet Take 40 mg in the morning and 20 mg (1/2 tablet) in the evening (Patient taking differently: Take 60 mg by mouth every morning.)   05/14/2024 Morning  levalbuterol  (XOPENEX ) 1.25 MG/3ML nebulizer solution Take 1.25 mg by nebulization every 6 (six) hours as needed for wheezing or shortness of breath.   Past Week   OXYGEN Inhale 2 L into the lungs at bedtime. Uses while sleeping   05/14/2024 Bedtime   spironolactone  (ALDACTONE ) 25 MG tablet Take 0.5 tablets (12.5 mg total) by mouth daily. 30 tablet 3 05/14/2024 Morning   warfarin (COUMADIN ) 1 MG tablet Take 1.5 mg by mouth See admin instructions. Take 1.5 tablets (1.5mg ) by mouth at bedtime on Saturday and Sunday   05/14/2024 at  6:45 PM   warfarin (COUMADIN ) 2 MG tablet Take 2 mg by mouth See admin instructions. Take 1 tablet (2mg ) by mouth at bedtime Monday - Friday.   05/14/2024 at  6:30 PM   colchicine 0.6 MG tablet Take 0.6 mg by mouth daily as needed (gout flare). (Patient not taking: Reported on 05/15/2024)   Not Taking    Assessment: Patient is a 73 yo M presenting with cough,  SOB, and chest pain. Per patient he takes Warfarin 2 mg Mon-Fri and 1.5 mg Saturday and Sunday. INR 1.9, CBC WNL and stable. Pharmacy is consulted to resume Warfarin.   Goal of Therapy:  INR 2-3 Monitor platelets by anticoagulation protocol: Yes   Plan:  Restart home dose schedule (2 mg Mon through Friday; 1.5 mg sat / sun)  Benedetta Heath BS, PharmD, BCPS Clinical Pharmacist 05/16/2024 7:35 AM  Contact: [phone] after 3 PM

## 2024-05-17 ENCOUNTER — Inpatient Hospital Stay (HOSPITAL_COMMUNITY)

## 2024-05-17 DIAGNOSIS — Z720 Tobacco use: Secondary | ICD-10-CM | POA: Diagnosis not present

## 2024-05-17 DIAGNOSIS — R7989 Other specified abnormal findings of blood chemistry: Secondary | ICD-10-CM | POA: Diagnosis not present

## 2024-05-17 DIAGNOSIS — J441 Chronic obstructive pulmonary disease with (acute) exacerbation: Secondary | ICD-10-CM | POA: Diagnosis not present

## 2024-05-17 DIAGNOSIS — J9621 Acute and chronic respiratory failure with hypoxia: Secondary | ICD-10-CM | POA: Diagnosis not present

## 2024-05-17 LAB — CBC
HCT: 41.4 % (ref 39.0–52.0)
Hemoglobin: 13.7 g/dL (ref 13.0–17.0)
MCH: 30.7 pg (ref 26.0–34.0)
MCHC: 33.1 g/dL (ref 30.0–36.0)
MCV: 92.8 fL (ref 80.0–100.0)
Platelets: 180 K/uL (ref 150–400)
RBC: 4.46 MIL/uL (ref 4.22–5.81)
RDW: 14.5 % (ref 11.5–15.5)
WBC: 13.4 K/uL — ABNORMAL HIGH (ref 4.0–10.5)
nRBC: 0 % (ref 0.0–0.2)

## 2024-05-17 LAB — BASIC METABOLIC PANEL WITH GFR
Anion gap: 9 (ref 5–15)
BUN: 18 mg/dL (ref 8–23)
CO2: 32 mmol/L (ref 22–32)
Calcium: 8.8 mg/dL — ABNORMAL LOW (ref 8.9–10.3)
Chloride: 99 mmol/L (ref 98–111)
Creatinine, Ser: 0.91 mg/dL (ref 0.61–1.24)
GFR, Estimated: >60 mL/min (ref >60)
Glucose, Bld: 117 mg/dL — ABNORMAL HIGH (ref 70–99)
Potassium: 4.1 mmol/L (ref 3.5–5.1)
Sodium: 140 mmol/L (ref 135–145)

## 2024-05-17 LAB — PROTIME-INR
INR: 2.2 — ABNORMAL HIGH (ref 0.8–1.2)
Prothrombin Time: 25.2 s — ABNORMAL HIGH (ref 11.4–15.2)

## 2024-05-17 LAB — MAGNESIUM: Magnesium: 2.2 mg/dL (ref 1.7–2.4)

## 2024-05-17 LAB — PROCALCITONIN: Procalcitonin: <0.1 ng/mL

## 2024-05-17 MED ORDER — DOXYCYCLINE HYCLATE 100 MG PO TABS
100.0000 mg | ORAL_TABLET | Freq: Two times a day (BID) | ORAL | Status: DC
  Administered 2024-05-17 – 2024-05-18 (×2): 100 mg via ORAL
  Filled 2024-05-17 (×2): qty 1

## 2024-05-17 NOTE — Progress Notes (Signed)
   05/17/24 1135  Mobility  Activity Ambulated with assistance  Level of Assistance Standby assist, set-up cues, supervision of patient - no hands on  Assistive Device Front wheel walker  Distance Ambulated (ft) 400 ft  Activity Response Tolerated fair  Mobility Referral Yes  Mobility visit 1 Mobility  Mobility Specialist Start Time (ACUTE ONLY) 1135  Mobility Specialist Stop Time (ACUTE ONLY) 1157  Mobility Specialist Time Calculation (min) (ACUTE ONLY) 22 min   Mobility Specialist: Progress Note  Pre-Mobility:      HR 58, SpO2 96% 2L During Mobility:              SpO2 89-92% 2L Post-Mobility:    HR 73, SpO2 92% 2L  Pt agreeable to mobility session - received in bed. Pt with SOB desat, quick recovery with PLB. Returned to chair with all needs met - call bell within reach. Chair alarm on.   ______________________________________________________________________________  During Mobility:             SpO2 88-93% 2L Post-Mobility:    HR 72, SpO2 94% 2L  Pt agreeable to mobility session - received in chair. Pt with SOB desat, quick recovery with PLB. Returned to bed with all needs met - call bell within reach.   Virgle Boards, BS Mobility Specialist Please contact via SecureChat or  Rehab office at [phone].

## 2024-05-17 NOTE — Progress Notes (Signed)
 PROGRESS NOTE        PATIENT DETAILS Name: Dennis Preston Age: 73 y.o. Sex: male Date of Birth: Jan 23, 1951 Admit Date: 05/15/2024 Admitting Physician Camila DELENA Ned, MD ERE:Yjwiz, Tamra, MD  Brief Summary: Patient is a 73 y.o.  male with history of COPD on nocturnal home O2 2 L, A-fib, HFpEF, CAD s/p CABG-presented with shortness of breath-found to have COPD exacerbation.  Significant events: 8/17>> admit to [facility]  Significant studies: 8/17>> CXR: No PNA  Significant microbiology data: 8/17>> COVID/influenza/RSV PCR: Negative 8/17>> respiratory virus panel: Negative  Procedures: None  Consults: None  Subjective: Feels much better-no more heartburn.  Stable on just 2 L of oxygen.  Objective: Vitals: Blood pressure (!) 104/56, pulse (!) 53, temperature 98.2 F (36.8 C), temperature source Oral, resp. rate 20, height 5' 11 (1.803 m), weight 78.7 kg, SpO2 93%.   Exam: Gen Exam:Alert awake-not in any distress HEENT:atraumatic, normocephalic Chest: B/L clear to auscultation anteriorly CVS:S1S2 regular Abdomen:soft non tender, non distended Extremities:no edema Neurology: Non focal Skin: no rash  Pertinent Labs/Radiology:    Latest Ref Rng & Units 05/17/2024    5:34 AM 05/16/2024    5:00 AM 05/15/2024    3:18 AM  CBC  WBC 4.0 - 10.5 K/uL 13.4  19.3    Hemoglobin 13.0 - 17.0 g/dL 86.2  85.3  84.3   Hematocrit 39.0 - 52.0 % 41.4  44.4  46.0   Platelets 150 - 400 K/uL 180  200      Lab Results  Component Value Date   NA 140 05/17/2024   K 4.1 05/17/2024   CL 99 05/17/2024   CO2 32 05/17/2024      Assessment/Plan: Acute on chronic hypoxic respiratory failure secondary to COPD exacerbation (on home O2 nocturnally) Much improved Stable on 2 L of oxygen Taper prednisone  Continue bronchodilators  stop Rocephin -switch to oral Doxy. If continues to improve-likely home on 8/20.  Chest pain Atypical and related to  COPD/reflux-currently complains of reflux. Troponins are minimally elevated and unlikely of any significance Recent echo March 2025 with stable EF.  GERD No further heartburn-continue PPI As needed Maalox  Leukocytosis Downtrending Repeat CXR 8/19-without pneumonia See above regarding antibiotics.  Chronic HFpEF Euvolemic Continue Lasix /Aldactone .  CAD s/p CABG Has reflux but no obvious anginal symptoms Continue Plavix //statin  Prolonged QTc Resolved as of twelve-lead EKG 8/19. Keep K> 4, Mg> 2 Avoid QTc prolonging agents.  PAF s/p ablation May 2025 Sinus rhythm Coumadin  per pharmacy Telemetry monitoring  PAD Stable Plavix /statin  HLD Statin  Anxiety Valium   Tobacco abuse Quit tobacco since June of this year.   Code status:   Code Status: Full Code   DVT Prophylaxis:  warfarin (COUMADIN ) tablet 2 mg  warfarin (COUMADIN ) tablet 1.5 mg    Family Communication: None at bedside   Disposition Plan: Status is: Inpatient Remains inpatient appropriate because: Severity of illness   Planned Discharge Destination:Home health   Diet: Diet Order             Diet Heart Room service appropriate? Yes; Fluid consistency: Thin  Diet effective now                     Antimicrobial agents: Anti-infectives (From admission, onward)    Start     Dose/Rate Route Frequency Ordered Stop   05/15/24 0900  cefTRIAXone  (ROCEPHIN ) 2 g in sodium chloride  0.9 % 100 mL IVPB        2 g 200 mL/hr over 30 Minutes Intravenous Every 24 hours 05/15/24 0848     05/15/24 0700  doxycycline  (VIBRAMYCIN ) 100 mg in sodium chloride  0.9 % 250 mL IVPB        100 mg 125 mL/hr over 120 Minutes Intravenous Every 12 hours 05/15/24 0640          MEDICATIONS: Scheduled Meds:  arformoterol   15 mcg Nebulization BID   atorvastatin   80 mg Oral q1800   budesonide  (PULMICORT ) nebulizer solution  0.5 mg Nebulization BID   clopidogrel   75 mg Oral q AM   diazepam   5 mg Oral QHS    diltiazem   180 mg Oral q AM   furosemide   60 mg Oral q morning   guaiFENesin   600 mg Oral BID   pantoprazole   40 mg Oral BID AC   predniSONE   40 mg Oral Q breakfast   sodium chloride  flush  3 mL Intravenous Q12H   spironolactone   12.5 mg Oral Daily   [START ON 05/21/2024] warfarin  1.5 mg Oral Once per day on Sunday Saturday   warfarin  2 mg Oral Once per day on Monday Tuesday Wednesday Thursday Friday   Warfarin - Pharmacist Dosing Inpatient   Does not apply q1600   Continuous Infusions:  cefTRIAXone  (ROCEPHIN )  IV 2 g (05/17/24 0835)   doxycycline  (VIBRAMYCIN ) IV 100 mg (05/17/24 0621)   PRN Meds:.acetaminophen  **OR** acetaminophen , albuterol , alum & mag hydroxide-simeth, diazepam , trimethobenzamide    I have personally reviewed following labs and imaging studies  LABORATORY DATA: CBC: Recent Labs  Lab 05/15/24 0234 05/15/24 0318 05/16/24 0500 05/17/24 0534  WBC 21.6*  --  19.3* 13.4*  HGB 15.0 15.6 14.6 13.7  HCT 45.3 46.0 44.4 41.4  MCV 92.6  --  92.1 92.8  PLT 195  --  200 180    Basic Metabolic Panel: Recent Labs  Lab 05/15/24 0234 05/15/24 0318 05/16/24 0500 05/17/24 0535  NA 137 138 140 140  K 3.6 3.7 4.4 4.1  CL 98  --  98 99  CO2 30  --  35* 32  GLUCOSE 144*  --  136* 117*  BUN 11  --  15 18  CREATININE 1.08  --  0.95 0.91  CALCIUM  9.2  --  8.9 8.8*  MG  --   --   --  2.2    GFR: Estimated Creatinine Clearance: 78.2 mL/min (by C-G formula based on SCr of 0.91 mg/dL).  Liver Function Tests: Recent Labs  Lab 05/15/24 1059  AST 27  ALT 15  ALKPHOS 76  BILITOT 0.7  PROT 6.3*  ALBUMIN  3.4*   No results for input(s): LIPASE, AMYLASE in the last 168 hours. No results for input(s): AMMONIA in the last 168 hours.  Coagulation Profile: Recent Labs  Lab 05/15/24 0234 05/16/24 0500 05/17/24 0535  INR 1.9* 2.4* 2.2*    Cardiac Enzymes: No results for input(s): CKTOTAL, CKMB, CKMBINDEX, TROPONINI in the last 168 hours.  BNP  (last 3 results) No results for input(s): PROBNP in the last 8760 hours.  Lipid Profile: No results for input(s): CHOL, HDL, LDLCALC, TRIG, CHOLHDL, LDLDIRECT in the last 72 hours.  Thyroid  Function Tests: No results for input(s): TSH, T4TOTAL, FREET4, T3FREE, THYROIDAB in the last 72 hours.  Anemia Panel: No results for input(s): VITAMINB12, FOLATE, FERRITIN, TIBC, IRON, RETICCTPCT in the last 72 hours.  Urine analysis:  Component Value Date/Time   COLORURINE YELLOW 07/03/2022 0819   APPEARANCEUR CLEAR 07/03/2022 0819   LABSPEC 1.025 07/03/2022 0819   PHURINE 6.0 07/03/2022 0819   GLUCOSEU NEGATIVE 07/03/2022 0819   HGBUR TRACE (A) 07/03/2022 0819   BILIRUBINUR NEGATIVE 07/03/2022 0819   KETONESUR NEGATIVE 07/03/2022 0819   PROTEINUR 100 (A) 07/03/2022 0819   UROBILINOGEN 0.2 05/10/2008 1654   NITRITE NEGATIVE 07/03/2022 0819   LEUKOCYTESUR NEGATIVE 07/03/2022 0819    Sepsis Labs: Lactic Acid, Venous    Component Value Date/Time   LATICACIDVEN 1.2 09/12/2022 1353    MICROBIOLOGY: Recent Results (from the past 240 hours)  Resp panel by RT-PCR (RSV, Flu A&B, Covid) Anterior Nasal Swab     Status: None   Collection Time: 05/15/24  2:50 AM   Specimen: Anterior Nasal Swab  Result Value Ref Range Status   SARS Coronavirus 2 by RT PCR NEGATIVE NEGATIVE Final   Influenza A by PCR NEGATIVE NEGATIVE Final   Influenza B by PCR NEGATIVE NEGATIVE Final    Comment: (NOTE) The Xpert Xpress SARS-CoV-2/FLU/RSV plus assay is intended as an aid in the diagnosis of influenza from Nasopharyngeal swab specimens and should not be used as a sole basis for treatment. Nasal washings and aspirates are unacceptable for Xpert Xpress SARS-CoV-2/FLU/RSV testing.  Fact Sheet for Patients: BloggerCourse.com  Fact Sheet for Healthcare Providers: SeriousBroker.it  This test is not yet approved or cleared by  the [place]  FDA and has been authorized for detection and/or diagnosis of SARS-CoV-2 by FDA under an Emergency Use Authorization (EUA). This EUA will remain in effect (meaning this test can be used) for the duration of the COVID-19 declaration under Section 564(b)(1) of the Act, 21 U.S.C. section 360bbb-3(b)(1), unless the authorization is terminated or revoked.     Resp Syncytial Virus by PCR NEGATIVE NEGATIVE Final    Comment: (NOTE) Fact Sheet for Patients: BloggerCourse.com  Fact Sheet for Healthcare Providers: SeriousBroker.it  This test is not yet approved or cleared by the [place]  FDA and has been authorized for detection and/or diagnosis of SARS-CoV-2 by FDA under an Emergency Use Authorization (EUA). This EUA will remain in effect (meaning this test can be used) for the duration of the COVID-19 declaration under Section 564(b)(1) of the Act, 21 U.S.C. section 360bbb-3(b)(1), unless the authorization is terminated or revoked.  Performed at [facility], [address], [place], [place] [address]   Respiratory (~20 pathogens) panel by PCR     Status: None   Collection Time: 05/15/24  7:51 PM   Specimen: Nasopharyngeal Swab; Respiratory  Result Value Ref Range Status   Adenovirus NOT DETECTED NOT DETECTED Final   Coronavirus 229E NOT DETECTED NOT DETECTED Final    Comment: (NOTE) The Coronavirus on the Respiratory Panel, DOES NOT test for the novel  Coronavirus (2019 nCoV)    Coronavirus HKU1 NOT DETECTED NOT DETECTED Final   Coronavirus NL63 NOT DETECTED NOT DETECTED Final   Coronavirus OC43 NOT DETECTED NOT DETECTED Final   Metapneumovirus NOT DETECTED NOT DETECTED Final   Rhinovirus / Enterovirus NOT DETECTED NOT DETECTED Final   Influenza A NOT DETECTED NOT DETECTED Final   Influenza B NOT DETECTED NOT DETECTED Final   Parainfluenza Virus 1 NOT DETECTED NOT DETECTED Final   Parainfluenza Virus 2  NOT DETECTED NOT DETECTED Final   Parainfluenza Virus 3 NOT DETECTED NOT DETECTED Final   Parainfluenza Virus 4 NOT DETECTED NOT DETECTED Final   Respiratory Syncytial Virus NOT DETECTED NOT  DETECTED Final   Bordetella pertussis NOT DETECTED NOT DETECTED Final   Bordetella Parapertussis NOT DETECTED NOT DETECTED Final   Chlamydophila pneumoniae NOT DETECTED NOT DETECTED Final   Mycoplasma pneumoniae NOT DETECTED NOT DETECTED Final    Comment: Performed at [facility], [address], [place], [place] [address]    RADIOLOGY STUDIES/RESULTS: DG Chest Port 1V same Day Result Date: 05/17/2024 CLINICAL DATA:  141880 SOB (shortness of breath) [id number]. EXAM: PORTABLE CHEST 1 VIEW COMPARISON:  05/15/2024. FINDINGS: Bilateral lung fields are clear. Bilateral costophrenic angles are clear. Stable cardio-mediastinal silhouette. There are surgical staples along the heart border and sternotomy wires, status post CABG (coronary artery bypass graft). No acute osseous abnormalities. The soft tissues are within normal limits. IMPRESSION: No active disease. Electronically Signed   By: Ree Molt M.D.   On: 05/17/2024 08:30     LOS: 2 days   Donalda Applebaum, MD  [facility]    To contact the attending provider between 7A-7P or the covering provider during after hours 7P-7A, please log into the web site [email or web address] and access using universal [place] password for that web site. If you do not have the password, please call the hospital operator.  05/17/2024, 10:05 AM

## 2024-05-17 NOTE — Plan of Care (Signed)
  Problem: Education: Goal: Knowledge of General Education information will improve Description: Including pain rating scale, medication(s)/side effects and non-pharmacologic comfort measures Outcome: Progressing   Problem: Health Behavior/Discharge Planning: Goal: Ability to manage health-related needs will improve Outcome: Progressing   Problem: Clinical Measurements: Goal: Ability to maintain clinical measurements within normal limits will improve Outcome: Progressing Goal: Will remain free from infection Outcome: Progressing Goal: Diagnostic test results will improve Outcome: Progressing Goal: Respiratory complications will improve Outcome: Progressing   Problem: Activity: Goal: Risk for activity intolerance will decrease Outcome: Progressing   Problem: Coping: Goal: Level of anxiety will decrease Outcome: Progressing   Problem: Elimination: Goal: Will not experience complications related to bowel motility Outcome: Progressing Goal: Will not experience complications related to urinary retention Outcome: Progressing   Problem: Pain Managment: Goal: General experience of comfort will improve and/or be controlled Outcome: Progressing   Problem: Education: Goal: Knowledge of disease or condition will improve Outcome: Progressing   Problem: Activity: Goal: Ability to tolerate increased activity will improve Outcome: Progressing   Problem: Respiratory: Goal: Ability to maintain a clear airway will improve Outcome: Progressing

## 2024-05-17 NOTE — Progress Notes (Signed)
 Called lab for STAT orders this am.

## 2024-05-17 NOTE — Progress Notes (Signed)
 PHARMACY - ANTICOAGULATION CONSULT NOTE  Pharmacy Consult for Warfarin  Indication: atrial fibrillation  Allergies  Allergen Reactions   Aspirin  Swelling    REACTION: reaction not known    Patient Measurements: Height: 5' 11 (180.3 cm) Weight: 78.7 kg (173 lb 8 oz) IBW/kg (Calculated) : 75.3 HEPARIN  DW (KG): 68  Vital Signs: Temp: 98.2 F (36.8 C) (08/19 0400) Temp Source: Oral (08/19 0400) BP: 104/56 (08/19 0400) Pulse Rate: 53 (08/19 0400)  Labs: Recent Labs    05/15/24 0234 05/15/24 0318 05/15/24 0633 05/15/24 1059 05/16/24 0500 05/17/24 0534 05/17/24 0535  HGB 15.0 15.6  --   --  14.6 13.7  --   HCT 45.3 46.0  --   --  44.4 41.4  --   PLT 195  --   --   --  200 180  --   LABPROT 22.9*  --   --   --  27.6*  --  25.2*  INR 1.9*  --   --   --  2.4*  --  2.2*  CREATININE 1.08  --   --   --  0.95  --  0.91  TROPONINIHS 30*  --  28* 23*  --   --   --     Estimated Creatinine Clearance: 78.2 mL/min (by C-G formula based on SCr of 0.91 mg/dL).   Medical History: Past Medical History:  Diagnosis Date   Acute diastolic heart failure (HCC) 09/14/2022   Acute metabolic encephalopathy 09/12/2022   Acute pulmonary edema (HCC)    Acute respiratory failure with hypoxia and hypercarbia (HCC)    AKI (acute kidney injury) (HCC) 08/25/2023   Alcohol abuse    Amaurosis fugax    Aortic atherosclerosis (HCC)    Atrial fibrillation (HCC)    Atrial flutter (HCC)    Carotid stenosis    CHF (congestive heart failure) (HCC)    Coagulopathy (HCC)    COPD (chronic obstructive pulmonary disease) (HCC)    Coronary artery disease    Depression    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, SVG-OM1, SVG-PDA, SVG-PL   Hyperlipemia    Hypertension    Hypotension 07/14/2015   Leucocytosis    Liver dysfunction    Long term current use of clopidogrel     Non-ST elevation (NSTEMI) myocardial infarction (HCC) 07/10/2015   NSTEMI (non-ST elevated myocardial infarction) (HCC)    approx 2016    Peripheral artery disease (HCC)    legs   Tobacco abuse    Wears dentures    full upper and lower    Medications:  Medications Prior to Admission  Medication Sig Dispense Refill Last Dose/Taking   acetaminophen  (TYLENOL ) 325 MG tablet Take 2 tablets (650 mg total) by mouth every 4 (four) hours as needed for headache or mild pain (pain score 1-3).   Past Week   atorvastatin  (LIPITOR ) 80 MG tablet Take 1 tablet (80 mg total) by mouth daily at 6 PM.   05/14/2024 Evening   clopidogrel  (PLAVIX ) 75 MG tablet Take 75 mg by mouth in the morning.   05/14/2024 Morning   diazepam  (VALIUM ) 5 MG tablet Take 1 tablet (5 mg total) by mouth at bedtime.   05/14/2024 Bedtime   diltiazem  (CARDIZEM  CD) 180 MG 24 hr capsule Take 1 capsule (180 mg total) by mouth in the morning. 60 capsule 6 05/14/2024 Morning   Fluticasone -Umeclidin-Vilant (TRELEGY ELLIPTA) 100-62.5-25 MCG/INH AEPB Inhale 1 puff into the lungs in the morning.   05/14/2024 Morning   furosemide  (  LASIX ) 40 MG tablet Take 40 mg in the morning and 20 mg (1/2 tablet) in the evening (Patient taking differently: Take 60 mg by mouth every morning.)   05/14/2024 Morning   levalbuterol  (XOPENEX ) 1.25 MG/3ML nebulizer solution Take 1.25 mg by nebulization every 6 (six) hours as needed for wheezing or shortness of breath.   Past Week   OXYGEN Inhale 2 L into the lungs at bedtime. Uses while sleeping   05/14/2024 Bedtime   spironolactone  (ALDACTONE ) 25 MG tablet Take 0.5 tablets (12.5 mg total) by mouth daily. 30 tablet 3 05/14/2024 Morning   warfarin (COUMADIN ) 1 MG tablet Take 1.5 mg by mouth See admin instructions. Take 1.5 tablets (1.5mg ) by mouth at bedtime on Saturday and Sunday   05/14/2024 at  6:45 PM   warfarin (COUMADIN ) 2 MG tablet Take 2 mg by mouth See admin instructions. Take 1 tablet (2mg ) by mouth at bedtime Monday - Friday.   05/14/2024 at  6:30 PM   colchicine 0.6 MG tablet Take 0.6 mg by mouth daily as needed (gout flare). (Patient not taking:  Reported on 05/15/2024)   Not Taking    Assessment: Patient is a 73 yo M presenting with cough, SOB, and chest pain. Per patient he takes Warfarin 2 mg Mon-Fri and 1.5 mg Saturday and Sunday. INR 1.9, CBC WNL and stable. Pharmacy is consulted to resume Warfarin.   Goal of Therapy:  INR 2-3 Monitor platelets by anticoagulation protocol: Yes   Plan:  Restart home dose schedule (2 mg Mon through Friday; 1.5 mg sat / sun)  Benedetta Heath BS, PharmD, BCPS Clinical Pharmacist 05/17/2024 8:39 AM  Contact: [phone] after 3 PM

## 2024-05-18 ENCOUNTER — Other Ambulatory Visit (HOSPITAL_COMMUNITY): Payer: Self-pay

## 2024-05-18 ENCOUNTER — Ambulatory Visit: Admitting: Cardiology

## 2024-05-18 DIAGNOSIS — J9621 Acute and chronic respiratory failure with hypoxia: Secondary | ICD-10-CM | POA: Diagnosis not present

## 2024-05-18 DIAGNOSIS — J441 Chronic obstructive pulmonary disease with (acute) exacerbation: Secondary | ICD-10-CM | POA: Diagnosis not present

## 2024-05-18 DIAGNOSIS — R6510 Systemic inflammatory response syndrome (SIRS) of non-infectious origin without acute organ dysfunction: Secondary | ICD-10-CM | POA: Diagnosis not present

## 2024-05-18 DIAGNOSIS — R7989 Other specified abnormal findings of blood chemistry: Secondary | ICD-10-CM | POA: Diagnosis not present

## 2024-05-18 LAB — PROTIME-INR
INR: 2 — ABNORMAL HIGH (ref 0.8–1.2)
Prothrombin Time: 23.9 s — ABNORMAL HIGH (ref 11.4–15.2)

## 2024-05-18 MED ORDER — DOXYCYCLINE HYCLATE 100 MG PO TABS
100.0000 mg | ORAL_TABLET | Freq: Two times a day (BID) | ORAL | 0 refills | Status: AC
  Filled 2024-05-18: qty 6, 3d supply, fill #0

## 2024-05-18 MED ORDER — PREDNISONE 10 MG PO TABS
ORAL_TABLET | ORAL | 0 refills | Status: AC
  Filled 2024-05-18: qty 10, 4d supply, fill #0

## 2024-05-18 MED ORDER — PANTOPRAZOLE SODIUM 40 MG PO TBEC
40.0000 mg | DELAYED_RELEASE_TABLET | Freq: Every day | ORAL | 1 refills | Status: AC
  Filled 2024-05-18: qty 30, 30d supply, fill #0

## 2024-05-18 NOTE — Progress Notes (Signed)
 PHARMACY - ANTICOAGULATION CONSULT NOTE  Pharmacy Consult for Warfarin  Indication: atrial fibrillation  Allergies  Allergen Reactions   Aspirin  Swelling    REACTION: reaction not known    Patient Measurements: Height: 5' 11 (180.3 cm) Weight: 78.2 kg (172 lb 6.4 oz) IBW/kg (Calculated) : 75.3 HEPARIN  DW (KG): 68  Vital Signs: Temp: 97.5 F (36.4 C) (08/20 0400) Temp Source: Oral (08/20 0400) BP: 152/57 (08/20 0400) Pulse Rate: 50 (08/20 0400)  Labs: Recent Labs    05/15/24 1059 05/16/24 0500 05/17/24 0534 05/17/24 0535 05/18/24 0521  HGB  --  14.6 13.7  --   --   HCT  --  44.4 41.4  --   --   PLT  --  200 180  --   --   LABPROT  --  27.6*  --  25.2* 23.9*  INR  --  2.4*  --  2.2* 2.0*  CREATININE  --  0.95  --  0.91  --   TROPONINIHS 23*  --   --   --   --     Estimated Creatinine Clearance: 78.2 mL/min (by C-G formula based on SCr of 0.91 mg/dL).   Medical History: Past Medical History:  Diagnosis Date   Acute diastolic heart failure (HCC) 09/14/2022   Acute metabolic encephalopathy 09/12/2022   Acute pulmonary edema (HCC)    Acute respiratory failure with hypoxia and hypercarbia (HCC)    AKI (acute kidney injury) (HCC) 08/25/2023   Alcohol abuse    Amaurosis fugax    Aortic atherosclerosis (HCC)    Atrial fibrillation (HCC)    Atrial flutter (HCC)    Carotid stenosis    CHF (congestive heart failure) (HCC)    Coagulopathy (HCC)    COPD (chronic obstructive pulmonary disease) (HCC)    Coronary artery disease    Depression    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, SVG-OM1, SVG-PDA, SVG-PL   Hyperlipemia    Hypertension    Hypotension 07/14/2015   Leucocytosis    Liver dysfunction    Long term current use of clopidogrel     Non-ST elevation (NSTEMI) myocardial infarction (HCC) 07/10/2015   NSTEMI (non-ST elevated myocardial infarction) (HCC)    approx 2016   Peripheral artery disease (HCC)    legs   Tobacco abuse    Wears dentures    full upper  and lower    Medications:  Medications Prior to Admission  Medication Sig Dispense Refill Last Dose/Taking   acetaminophen  (TYLENOL ) 325 MG tablet Take 2 tablets (650 mg total) by mouth every 4 (four) hours as needed for headache or mild pain (pain score 1-3).   Past Week   atorvastatin  (LIPITOR ) 80 MG tablet Take 1 tablet (80 mg total) by mouth daily at 6 PM.   05/14/2024 Evening   clopidogrel  (PLAVIX ) 75 MG tablet Take 75 mg by mouth in the morning.   05/14/2024 Morning   diazepam  (VALIUM ) 5 MG tablet Take 1 tablet (5 mg total) by mouth at bedtime.   05/14/2024 Bedtime   diltiazem  (CARDIZEM  CD) 180 MG 24 hr capsule Take 1 capsule (180 mg total) by mouth in the morning. 60 capsule 6 05/14/2024 Morning   Fluticasone -Umeclidin-Vilant (TRELEGY ELLIPTA) 100-62.5-25 MCG/INH AEPB Inhale 1 puff into the lungs in the morning.   05/14/2024 Morning   furosemide  (LASIX ) 40 MG tablet Take 40 mg in the morning and 20 mg (1/2 tablet) in the evening (Patient taking differently: Take 60 mg by mouth every morning.)  05/14/2024 Morning   levalbuterol  (XOPENEX ) 1.25 MG/3ML nebulizer solution Take 1.25 mg by nebulization every 6 (six) hours as needed for wheezing or shortness of breath.   Past Week   OXYGEN Inhale 2 L into the lungs at bedtime. Uses while sleeping   05/14/2024 Bedtime   spironolactone  (ALDACTONE ) 25 MG tablet Take 0.5 tablets (12.5 mg total) by mouth daily. 30 tablet 3 05/14/2024 Morning   warfarin (COUMADIN ) 1 MG tablet Take 1.5 mg by mouth See admin instructions. Take 1.5 tablets (1.5mg ) by mouth at bedtime on Saturday and Sunday   05/14/2024 at  6:45 PM   warfarin (COUMADIN ) 2 MG tablet Take 2 mg by mouth See admin instructions. Take 1 tablet (2mg ) by mouth at bedtime Monday - Friday.   05/14/2024 at  6:30 PM   colchicine 0.6 MG tablet Take 0.6 mg by mouth daily as needed (gout flare). (Patient not taking: Reported on 05/15/2024)   Not Taking    Assessment: Patient is a 73 yo M presenting with cough,  SOB, and chest pain. Per patient he takes Warfarin 2 mg Mon-Fri and 1.5 mg Saturday and Sunday. INR 1.9, CBC WNL and stable. Pharmacy is consulted to resume Warfarin.   Goal of Therapy:  INR 2-3 Monitor platelets by anticoagulation protocol: Yes   Plan:  Continue home dose (2 mg Mon through Friday; 1.5 mg sat / sun)  Benedetta Heath BS, PharmD, BCPS Clinical Pharmacist 05/18/2024 7:08 AM  Contact: [phone] after 3 PM

## 2024-05-18 NOTE — Discharge Summary (Signed)
 PATIENT DETAILS Name: Dennis Preston Age: 73 y.o. Sex: male Date of Birth: 1950/10/11 MRN: [id number]. Admitting Physician: Camila DELENA Ned, MD ERE:Yjwiz, Tamra, MD  Admit Date: 05/15/2024 Discharge date: 05/18/2024  Recommendations for Outpatient Follow-up:  Follow up with PCP in 1-2 weeks Please obtain CMP/CBC in one week  Admitted From:  Home  Disposition: Home   Discharge Condition: good  CODE STATUS:   Code Status: Full Code   Diet recommendation:  Diet Order             Diet - low sodium heart healthy           Diet Heart Room service appropriate? Yes; Fluid consistency: Thin  Diet effective now                    Brief Summary: Patient is a 73 y.o.  male with history of COPD on nocturnal home O2 2 L, A-fib, HFpEF, CAD s/p CABG-presented with shortness of breath-found to have COPD exacerbation.   Significant events: 8/17>> admit to [facility]   Significant studies: 8/17>> CXR: No PNA   Significant microbiology data: 8/17>> COVID/influenza/RSV PCR: Negative 8/17>> respiratory virus panel: Negative   Procedures: None   Consults: None  Brief Hospital Course: Acute on chronic hypoxic respiratory failure secondary to COPD exacerbation (on home O2 nocturnally) Much improved after steroids/bronchodilators/empiric antibiotics Lungs clear this morning Stable on usual baseline of 2 L of oxygen Plan is to discharge home on tapering prednisone -he will continue on doxycycline  for a few days-and continue on his usual bronchodilator regimen.   Follow-up with PCP and optimize accordingly.   Chest pain Atypical and related to COPD/reflux-currently complains of reflux. Troponins are minimally elevated and unlikely of any significance Recent echo March 2025 with stable EF.   GERD No further heartburn-continue PPI on discharge. As needed Maalox   Leukocytosis Downtrending-some amount of is also from steroids Repeat CXR 8/19-without  pneumonia Procalcitonin negative See above regarding antibiotics.   Chronic HFpEF Euvolemic Continue Lasix /Aldactone .   CAD s/p CABG Has reflux but no obvious anginal symptoms Continue Plavix //statin   Prolonged QTc Resolved as of twelve-lead EKG 8/19.   PAF s/p ablation May 2025 Sinus rhythm Resume usual Coumadin  dosing Follow-up with PCP for frequent INR checks.  PAD Stable Plavix /statin   HLD Statin   Anxiety Valium    Tobacco abuse Quit tobacco since June of this year.  Discharge Diagnoses:  Principal Problem:   COPD exacerbation (HCC) Active Problems:   Acute on chronic respiratory failure (HCC)   Chest pain   Elevated troponin   SIRS (systemic inflammatory response syndrome) (HCC)   Essential hypertension   Atrial fibrillation and flutter (HCC)   Subtherapeutic international normalized ratio (INR)   Chronic heart failure with preserved ejection fraction (HFpEF) (HCC)   Coronary artery disease   PAD (peripheral artery disease) (HCC)   Prolonged QT interval   Anxiety state   Hyperlipidemia   Tobacco abuse   Discharge Instructions:  Activity:  As tolerated   Discharge Instructions     (HEART FAILURE PATIENTS) Call MD:  Anytime you have any of the following symptoms: 1) 3 pound weight gain in 24 hours or 5 pounds in 1 week 2) shortness of breath, with or without a dry hacking cough 3) swelling in the hands, feet or stomach 4) if you have to sleep on extra pillows at night in order to breathe.   Complete by: As directed    Call MD for:  difficulty  breathing, headache or visual disturbances   Complete by: As directed    Diet - low sodium heart healthy   Complete by: As directed    Discharge instructions   Complete by: As directed    Follow with Primary MD  [name], MD in 1-2 weeks  Please get a complete blood count and chemistry panel checked by your Primary MD at your next visit, and again as instructed by your Primary MD.  Get  Medicines reviewed and adjusted: Please take all your medications with you for your next visit with your Primary MD  Laboratory/radiological data: Please request your Primary MD to go over all hospital tests and procedure/radiological results at the follow up, please ask your Primary MD to get all Hospital records sent to his/her office.  In some cases, they will be blood work, cultures and biopsy results pending at the time of your discharge. Please request that your primary care M.D. follows up on these results.  Also Note the following: If you experience worsening of your admission symptoms, develop shortness of breath, life threatening emergency, suicidal or homicidal thoughts you must seek medical attention immediately by calling 911 or calling your MD immediately  if symptoms less severe.  You must read complete instructions/literature along with all the possible adverse reactions/side effects for all the Medicines you take and that have been prescribed to you. Take any new Medicines after you have completely understood and accpet all the possible adverse reactions/side effects.   Do not drive when taking Pain medications or sleeping medications (Benzodaizepines)  Do not take more than prescribed Pain, Sleep and Anxiety Medications. It is not advisable to combine anxiety,sleep and pain medications without talking with your primary care practitioner  Special Instructions: If you have smoked or chewed Tobacco  in the last 2 yrs please stop smoking, stop any regular Alcohol  and or any Recreational drug use.  Wear Seat belts while driving.  Please note: You were cared for by a hospitalist during your hospital stay. Once you are discharged, your primary care physician will handle any further medical issues. Please note that NO REFILLS for any discharge medications will be authorized once you are discharged, as it is imperative that you return to your primary care physician (or establish a  relationship with a primary care physician if you do not have one) for your post hospital discharge needs so that they can reassess your need for medications and monitor your lab values.   Increase activity slowly   Complete by: As directed       Allergies as of 05/18/2024       Reactions   Aspirin  Swelling   REACTION: reaction not known        Medication List     TAKE these medications    acetaminophen  325 MG tablet Commonly known as: TYLENOL  Take 2 tablets (650 mg total) by mouth every 4 (four) hours as needed for headache or mild pain (pain score 1-3).   atorvastatin  80 MG tablet Commonly known as: LIPITOR  Take 1 tablet (80 mg total) by mouth daily at 6 PM.   clopidogrel  75 MG tablet Commonly known as: PLAVIX  Take 75 mg by mouth in the morning.   colchicine 0.6 MG tablet Take 0.6 mg by mouth daily as needed (gout flare).   diazepam  5 MG tablet Commonly known as: VALIUM  Take 1 tablet (5 mg total) by mouth at bedtime.   diltiazem  180 MG 24 hr capsule Commonly known as: CARDIZEM  CD  Take 1 capsule (180 mg total) by mouth in the morning.   doxycycline  100 MG tablet Commonly known as: VIBRA -TABS Take 1 tablet (100 mg total) by mouth every 12 (twelve) hours for 3 days.   furosemide  40 MG tablet Commonly known as: LASIX  Take 40 mg in the morning and 20 mg (1/2 tablet) in the evening What changed:  how much to take how to take this when to take this additional instructions   levalbuterol  1.25 MG/3ML nebulizer solution Commonly known as: XOPENEX  Take 1.25 mg by nebulization every 6 (six) hours as needed for wheezing or shortness of breath.   OXYGEN Inhale 2 L into the lungs at bedtime. Uses while sleeping   pantoprazole  40 MG tablet Commonly known as: PROTONIX  Take 1 tablet (40 mg total) by mouth daily.   predniSONE  10 MG tablet Commonly known as: DELTASONE  Take 40 mg daily for 1 day, 30 mg daily for 1 day, 20 mg daily for 1 days,10 mg daily for 1 day,  then stop   spironolactone  25 MG tablet Commonly known as: ALDACTONE  Take 0.5 tablets (12.5 mg total) by mouth daily.   Trelegy Ellipta 100-62.5-25 MCG/INH Aepb Generic drug: Fluticasone -Umeclidin-Vilant Inhale 1 puff into the lungs in the morning.   warfarin 2 MG tablet Commonly known as: COUMADIN  Take 2 mg by mouth See admin instructions. Take 1 tablet (2mg ) by mouth at bedtime Monday - Friday.   warfarin 1 MG tablet Commonly known as: COUMADIN  Take 1.5 mg by mouth See admin instructions. Take 1.5 tablets (1.5mg ) by mouth at bedtime on Saturday and Sunday        Follow-up Information     Hande, Vishwanath, MD. Schedule an appointment as soon as possible for a visit in 1 week(s).   Specialty: Internal Medicine Contact information: [address] [place] [address] [phone]                Allergies  Allergen Reactions   Aspirin  Swelling    REACTION: reaction not known     Other Procedures/Studies: DG Chest Port 1V same Day Result Date: 05/17/2024 CLINICAL DATA:  141880 SOB (shortness of breath) 141880. EXAM: PORTABLE CHEST 1 VIEW COMPARISON:  05/15/2024. FINDINGS: Bilateral lung fields are clear. Bilateral costophrenic angles are clear. Stable cardio-mediastinal silhouette. There are surgical staples along the heart border and sternotomy wires, status post CABG (coronary artery bypass graft). No acute osseous abnormalities. The soft tissues are within normal limits. IMPRESSION: No active disease. Electronically Signed   By: Ree Molt M.D.   On: 05/17/2024 08:30   DG Chest Portable 1 View Result Date: 05/15/2024 CLINICAL DATA:  Shortness of breath, chest pain, cough EXAM: PORTABLE CHEST 1 VIEW COMPARISON:  03/11/2024 FINDINGS: Prior CABG. Heart and mediastinal contours are within normal limits. No focal opacities or effusions. No acute bony abnormality. IMPRESSION: No active cardiopulmonary disease. Electronically Signed   By:  Franky Crease M.D.   On: 05/15/2024 02:56     TODAY-DAY OF DISCHARGE:  Subjective:   Willard Farquharson today has no headache,no chest abdominal pain,no new weakness tingling or numbness, feels much better wants to go home today.  Objective:   Blood pressure (!) 152/57, pulse 65, temperature (!) 97.5 F (36.4 C), temperature source Oral, resp. rate 20, height 5' 11 (1.803 m), weight 78.2 kg, SpO2 97%.  Intake/Output Summary (Last 24 hours) at 05/18/2024 0833 Last data filed at 05/18/2024 0600 Gross per 24 hour  Intake 1320 ml  Output 1500  ml  Net -180 ml   Filed Weights   05/16/24 0500 05/17/24 0500 05/18/24 0500  Weight: 77.8 kg 78.7 kg 78.2 kg    Exam: Awake Alert, Oriented *3, No new F.N deficits, Normal affect [place].AT,PERRAL Supple Neck,No JVD, No cervical lymphadenopathy appriciated.  Symmetrical Chest wall movement, Good air movement bilaterally, CTAB RRR,No Gallops,Rubs or new Murmurs, No Parasternal Heave +ve B.Sounds, Abd Soft, Non tender, No organomegaly appriciated, No rebound -guarding or rigidity. No Cyanosis, Clubbing or edema, No new Rash or bruise   PERTINENT RADIOLOGIC STUDIES: DG Chest Port 1V same Day Result Date: 05/17/2024 CLINICAL DATA:  141880 SOB (shortness of breath) 141880. EXAM: PORTABLE CHEST 1 VIEW COMPARISON:  05/15/2024. FINDINGS: Bilateral lung fields are clear. Bilateral costophrenic angles are clear. Stable cardio-mediastinal silhouette. There are surgical staples along the heart border and sternotomy wires, status post CABG (coronary artery bypass graft). No acute osseous abnormalities. The soft tissues are within normal limits. IMPRESSION: No active disease. Electronically Signed   By: Ree Molt M.D.   On: 05/17/2024 08:30     PERTINENT LAB RESULTS: CBC: Recent Labs    05/16/24 0500 05/17/24 0534  WBC 19.3* 13.4*  HGB 14.6 13.7  HCT 44.4 41.4  PLT 200 180   CMET CMP     Component Value Date/Time   NA 140 05/17/2024 0535   K  4.1 05/17/2024 0535   CL 99 05/17/2024 0535   CO2 32 05/17/2024 0535   GLUCOSE 117 (H) 05/17/2024 0535   BUN 18 05/17/2024 0535   CREATININE 0.91 05/17/2024 0535   CALCIUM  8.8 (L) 05/17/2024 0535   PROT 6.3 (L) 05/15/2024 1059   ALBUMIN  3.4 (L) 05/15/2024 1059   AST 27 05/15/2024 1059   ALT 15 05/15/2024 1059   ALKPHOS 76 05/15/2024 1059   BILITOT 0.7 05/15/2024 1059   GFRNONAA >60 05/17/2024 0535    GFR Estimated Creatinine Clearance: 78.2 mL/min (by C-G formula based on SCr of 0.91 mg/dL). No results for input(s): LIPASE, AMYLASE in the last 72 hours. No results for input(s): CKTOTAL, CKMB, CKMBINDEX, TROPONINI in the last 72 hours. Invalid input(s): POCBNP Recent Labs    05/15/24 1059  DDIMER <0.27   No results for input(s): HGBA1C in the last 72 hours. No results for input(s): CHOL, HDL, LDLCALC, TRIG, CHOLHDL, LDLDIRECT in the last 72 hours. No results for input(s): TSH, T4TOTAL, T3FREE, THYROIDAB in the last 72 hours.  Invalid input(s): FREET3 No results for input(s): VITAMINB12, FOLATE, FERRITIN, TIBC, IRON, RETICCTPCT in the last 72 hours. Coags: Recent Labs    05/17/24 0535 05/18/24 0521  INR 2.2* 2.0*   Microbiology: Recent Results (from the past 240 hours)  Resp panel by RT-PCR (RSV, Flu A&B, Covid) Anterior Nasal Swab     Status: None   Collection Time: 05/15/24  2:50 AM   Specimen: Anterior Nasal Swab  Result Value Ref Range Status   SARS Coronavirus 2 by RT PCR NEGATIVE NEGATIVE Final   Influenza A by PCR NEGATIVE NEGATIVE Final   Influenza B by PCR NEGATIVE NEGATIVE Final    Comment: (NOTE) The Xpert Xpress SARS-CoV-2/FLU/RSV plus assay is intended as an aid in the diagnosis of influenza from Nasopharyngeal swab specimens and should not be used as a sole basis for treatment. Nasal washings and aspirates are unacceptable for Xpert Xpress SARS-CoV-2/FLU/RSV testing.  Fact Sheet for  Patients: BloggerCourse.com  Fact Sheet for Healthcare Providers: SeriousBroker.it  This test is not yet approved or cleared by the [place]  FDA  and has been authorized for detection and/or diagnosis of SARS-CoV-2 by FDA under an Emergency Use Authorization (EUA). This EUA will remain in effect (meaning this test can be used) for the duration of the COVID-19 declaration under Section 564(b)(1) of the Act, 21 U.S.C. section 360bbb-3(b)(1), unless the authorization is terminated or revoked.     Resp Syncytial Virus by PCR NEGATIVE NEGATIVE Final    Comment: (NOTE) Fact Sheet for Patients: BloggerCourse.com  Fact Sheet for Healthcare Providers: SeriousBroker.it  This test is not yet approved or cleared by the [place]  FDA and has been authorized for detection and/or diagnosis of SARS-CoV-2 by FDA under an Emergency Use Authorization (EUA). This EUA will remain in effect (meaning this test can be used) for the duration of the COVID-19 declaration under Section 564(b)(1) of the Act, 21 U.S.C. section 360bbb-3(b)(1), unless the authorization is terminated or revoked.  Performed at [facility], [address] [place], [place] [address]   Respiratory (~20 pathogens) panel by PCR     Status: None   Collection Time: 05/15/24  7:51 PM   Specimen: Nasopharyngeal Swab; Respiratory  Result Value Ref Range Status   Adenovirus NOT DETECTED NOT DETECTED Final   Coronavirus 229E NOT DETECTED NOT DETECTED Final    Comment: (NOTE) The Coronavirus on the Respiratory Panel, DOES NOT test for the novel  Coronavirus (2019 nCoV)    Coronavirus HKU1 NOT DETECTED NOT DETECTED Final   Coronavirus NL63 NOT DETECTED NOT DETECTED Final   Coronavirus OC43 NOT DETECTED NOT DETECTED Final   Metapneumovirus NOT DETECTED NOT DETECTED Final   Rhinovirus / Enterovirus NOT DETECTED NOT  DETECTED Final   Influenza A NOT DETECTED NOT DETECTED Final   Influenza B NOT DETECTED NOT DETECTED Final   Parainfluenza Virus 1 NOT DETECTED NOT DETECTED Final   Parainfluenza Virus 2 NOT DETECTED NOT DETECTED Final   Parainfluenza Virus 3 NOT DETECTED NOT DETECTED Final   Parainfluenza Virus 4 NOT DETECTED NOT DETECTED Final   Respiratory Syncytial Virus NOT DETECTED NOT DETECTED Final   Bordetella pertussis NOT DETECTED NOT DETECTED Final   Bordetella Parapertussis NOT DETECTED NOT DETECTED Final   Chlamydophila pneumoniae NOT DETECTED NOT DETECTED Final   Mycoplasma pneumoniae NOT DETECTED NOT DETECTED Final    Comment: Performed at [facility], [address], [place], [place] [address]    FURTHER DISCHARGE INSTRUCTIONS:  Get Medicines reviewed and adjusted: Please take all your medications with you for your next visit with your Primary MD  Laboratory/radiological data: Please request your Primary MD to go over all hospital tests and procedure/radiological results at the follow up, please ask your Primary MD to get all Hospital records sent to his/her office.  In some cases, they will be blood work, cultures and biopsy results pending at the time of your discharge. Please request that your primary care M.D. goes through all the records of your hospital data and follows up on these results.  Also Note the following: If you experience worsening of your admission symptoms, develop shortness of breath, life threatening emergency, suicidal or homicidal thoughts you must seek medical attention immediately by calling 911 or calling your MD immediately  if symptoms less severe.  You must read complete instructions/literature along with all the possible adverse reactions/side effects for all the Medicines you take and that have been prescribed to you. Take any new Medicines after you have completely understood and accpet all the possible adverse reactions/side effects.   Do not  drive when taking Pain medications or sleeping medications (Benzodaizepines)  Do not take more than prescribed Pain, Sleep and Anxiety Medications. It is not advisable to combine anxiety,sleep and pain medications without talking with your primary care practitioner  Special Instructions: If you have smoked or chewed Tobacco  in the last 2 yrs please stop smoking, stop any regular Alcohol  and or any Recreational drug use.  Wear Seat belts while driving.  Please note: You were cared for by a hospitalist during your hospital stay. Once you are discharged, your primary care physician will handle any further medical issues. Please note that NO REFILLS for any discharge medications will be authorized once you are discharged, as it is imperative that you return to your primary care physician (or establish a relationship with a primary care physician if you do not have one) for your post hospital discharge needs so that they can reassess your need for medications and monitor your lab values.  Total Time spent coordinating discharge including counseling, education and face to face time equals greater than 30 minutes.  SignedBETHA Donalda Applebaum 05/18/2024 8:33 AM

## 2024-05-18 NOTE — Care Management Important Message (Signed)
 Important Message  Patient Details  Name: Dennis Preston MRN: [id number] Date of Birth: [birth year]   Important Message Given:  Yes - Medicare IM     [name] 05/18/2024, 3:25 PM

## 2024-05-25 DIAGNOSIS — R634 Abnormal weight loss: Secondary | ICD-10-CM | POA: Diagnosis not present

## 2024-05-25 DIAGNOSIS — I509 Heart failure, unspecified: Secondary | ICD-10-CM | POA: Diagnosis not present

## 2024-05-25 DIAGNOSIS — Z87891 Personal history of nicotine dependence: Secondary | ICD-10-CM | POA: Diagnosis not present

## 2024-05-25 DIAGNOSIS — J449 Chronic obstructive pulmonary disease, unspecified: Secondary | ICD-10-CM | POA: Diagnosis not present

## 2024-05-25 DIAGNOSIS — I4891 Unspecified atrial fibrillation: Secondary | ICD-10-CM | POA: Diagnosis not present

## 2024-05-25 DIAGNOSIS — I480 Paroxysmal atrial fibrillation: Secondary | ICD-10-CM | POA: Diagnosis not present

## 2024-06-23 ENCOUNTER — Ambulatory Visit: Attending: Cardiology | Admitting: Cardiology

## 2024-06-23 VITALS — BP 137/60 | HR 79 | Ht 65.0 in | Wt 151.2 lb

## 2024-06-23 DIAGNOSIS — D6869 Other thrombophilia: Secondary | ICD-10-CM

## 2024-06-23 DIAGNOSIS — I480 Paroxysmal atrial fibrillation: Secondary | ICD-10-CM | POA: Diagnosis not present

## 2024-06-23 DIAGNOSIS — E876 Hypokalemia: Secondary | ICD-10-CM | POA: Diagnosis not present

## 2024-06-23 DIAGNOSIS — I483 Typical atrial flutter: Secondary | ICD-10-CM | POA: Diagnosis not present

## 2024-06-23 NOTE — Progress Notes (Signed)
 Electrophysiology Clinic Note    Date:  06/23/2024  Patient ID:  Dennis Preston, Dennis Preston 11/13/1950, MRN [id number] PCP:  [name], MD  Cardiologist:  Cara JONETTA Lovelace, MD  Electrophysiologist:  Fonda Kitty, MD  Electrophysiology APP:  Brent Taillon, NP     Discussed the use of AI scribe software for clinical note transcription with the patient, who gave verbal consent to proceed.   Patient Profile    Chief Complaint: AF ablation follow-up  History of Present Illness: Dennis Preston is a 73 y.o. male with PMH notable for parox AFib, aflutter, CAD s/p CABG, PAD s/p bifemoral bypass, HF, HTN, COPD on Os at night, OSA ; seen today for Fonda Kitty, MD for routine electrophysiology follow-up s/p AF Ablation.  He is s/p AF ablation w isolation of pulm veins, posterior wall, and CTI for aflutter on 02/18/2024 by Dr. Kitty.   I saw him for routine 1 month post-ablation appt where he was maintaining sinus, less fatigued.   He was recently hospitalized for COPD exacerbation 04/2024  On follow-up today, he is doing well from a cardiac perspective. He is not aware of any AF episodes. He is able to work in his garden and HR rises, but falls with rest. Previously, heart rate would rise and stay elevated to greater than 150bpm.  He recently saw Dr. Lovelace who questioned whether he may be a watchman candidate given his persistent bleeding from cuts while being on warfarin and plavix .    Warfarin managed at [facility] cardiology, no bleeding through GI/GU tracts.     Arrhythmia/Device History No specialty comments available.    ROS:  Please see the history of present illness. All other systems are reviewed and otherwise negative.    Physical Exam    VS:  BP 137/60 (BP Location: Right Arm, Patient Position: Sitting, Cuff Size: Normal)   Pulse 79   Ht 5' 5 (1.651 m)   Wt 151 lb 3.2 oz (68.6 kg)   SpO2 100%   BMI 25.16 kg/m  BMI: Body mass index is 25.16 kg/m.       Wt Readings from Last 3 Encounters:  06/23/24 151 lb 3.2 oz (68.6 kg)  05/18/24 172 lb 6.4 oz (78.2 kg)  03/18/24 151 lb 9.6 oz (68.8 kg)     GEN- The patient is well appearing, alert and oriented x 3 today.   Lungs- diminished though clear to ausculation bilaterally, normal work of breathing.  Heart- Regular rate and rhythm, no murmurs, rubs or gallops Extremities- Trace peripheral edema, warm, dry    Studies Reviewed   Previous EP, cardiology notes.    EKG is ordered. Personal review of EKG from today shows:    EKG Interpretation Date/Time:  Thursday June 23 2024 14:04:39 EDT Ventricular Rate:  72 PR Interval:  144 QRS Duration:  102 QT Interval:  432 QTC Calculation: 473 R Axis:   90  Text Interpretation: Normal sinus rhythm Rightward axis Confirmed by Blair Mesina [phone]) on 06/23/2024 2:25:08 PM    TEE, 12/28/2023  1. Left ventricular ejection fraction, by estimation, is 55 to 60%. Left ventricular ejection fraction by 3D volume is 58 %. The left ventricle has normal function.   2. Right ventricular systolic function is normal. The right ventricular size is normal.   3. Left atrial size was mild to moderately dilated. No left atrial/left atrial appendage thrombus was detected. The LAA emptying velocity was 52 cm/s.   4. Right atrial size was  mildly dilated.   5. The mitral valve is grossly normal. Trivial mitral valve regurgitation. No evidence of mitral stenosis.   6. The aortic valve is tricuspid. Aortic valve regurgitation is mild. No aortic stenosis is present.   7. There is mild (Grade II) plaque involving the descending aorta and aortic arch.   8. 3D performed for the EF and 3D performed of the LAA and demonstrates 3D LVEF normal. 3D LAA without thrombus.    TTE, 08/25/2023  1. Left ventricular ejection fraction, by estimation, is 55 to 60%. The left ventricle has normal function. The left ventricle has no regional wall motion abnormalities. There is mild  concentric left ventricular hypertrophy. Left ventricular diastolic function could not be evaluated.   2. Right ventricular systolic function is mildly reduced. The right ventricular size is normal.   3. The mitral valve is normal in structure. Trivial mitral valve regurgitation. No evidence of mitral stenosis.   4. The aortic valve is tricuspid. There is mild calcification of the aortic valve. Aortic valve regurgitation is not visualized. Aortic valve sclerosis/calcification is present, without any evidence of aortic stenosis.   5. The inferior vena cava is normal in size with greater than 50% respiratory variability, suggesting right atrial pressure of 3 mmHg.    Assessment and Plan     #) parox AFib #) typical aflutter S/p AFib, aflutter ablation 01/2024  No recurrence of arrhythmia since  #) Hypercoag d/t parox afib CHA2DS2-VASc Score = at least 6 [CHF History: 1, HTN History: 1, Diabetes History: 0, Stroke History: 2, Vascular Disease History: 1, Age Score: 1, Gender Score: 0].  Therefore, the patient's annual risk of stroke is 9.7 %.    Stroke ppx - Warfarin, managed at [facility] cardiology,  Has frequent skin tears with bleeding on antiplatelet and OAC.  We discussed Watchman procedure and he is interested in pursuing if anatomy is suitable. 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      Current medicines are reviewed at length with the patient today.   The patient has concerns regarding his medicines.  The following changes were made today:  none  Labs/ tests ordered today include:  Orders Placed This Encounter  Procedures   EKG 12-Lead     Disposition: Follow up with Dr. Kennyth in 6 months, or sooner for watchman eval   Signed, Chantal Needle, NP  06/23/24  3:22 PM  Electrophysiology [facility]

## 2024-06-23 NOTE — Patient Instructions (Addendum)
 Medication Instructions:  Your physician recommends that you continue on your current medications as directed. Please refer to the Current Medication list given to you today.  *If you need a refill on your cardiac medications before your next appointment, please call your pharmacy*  Lab Work:          No labs ordered today  If you have labs (blood work) drawn today and your tests are completely normal, you will receive your results only by: MyChart Message (if you have MyChart) OR A paper copy in the mail If you have any lab test that is abnormal or we need to change your treatment, we will call you to review the results.  Testing/Procedures: No test ordered today   Follow-Up: At [facility], you and your health needs are our priority.  As part of our continuing mission to provide you with exceptional heart care, our providers are all part of one team.  This team includes your primary Cardiologist (physician) and Advanced Practice Providers or APPs (Physician Assistants and Nurse Practitioners) who all work together to provide you with the care you need, when you need it.  Your next appointment:    6 month(s)  Provider:   Dr. Kennyth

## 2024-07-04 DIAGNOSIS — Z9981 Dependence on supplemental oxygen: Secondary | ICD-10-CM | POA: Diagnosis not present

## 2024-07-04 DIAGNOSIS — G4733 Obstructive sleep apnea (adult) (pediatric): Secondary | ICD-10-CM | POA: Diagnosis not present

## 2024-07-04 DIAGNOSIS — R911 Solitary pulmonary nodule: Secondary | ICD-10-CM | POA: Diagnosis not present

## 2024-07-04 DIAGNOSIS — I480 Paroxysmal atrial fibrillation: Secondary | ICD-10-CM | POA: Diagnosis not present

## 2024-07-04 DIAGNOSIS — Z7901 Long term (current) use of anticoagulants: Secondary | ICD-10-CM | POA: Diagnosis not present

## 2024-07-04 DIAGNOSIS — Z23 Encounter for immunization: Secondary | ICD-10-CM | POA: Diagnosis not present

## 2024-07-04 DIAGNOSIS — J449 Chronic obstructive pulmonary disease, unspecified: Secondary | ICD-10-CM | POA: Diagnosis not present

## 2024-07-12 ENCOUNTER — Ambulatory Visit (INDEPENDENT_AMBULATORY_CARE_PROVIDER_SITE_OTHER): Admitting: Vascular Surgery

## 2024-07-12 ENCOUNTER — Encounter (INDEPENDENT_AMBULATORY_CARE_PROVIDER_SITE_OTHER): Payer: Self-pay | Admitting: Vascular Surgery

## 2024-07-12 ENCOUNTER — Ambulatory Visit (INDEPENDENT_AMBULATORY_CARE_PROVIDER_SITE_OTHER)

## 2024-07-12 VITALS — BP 122/74 | HR 76 | Resp 18 | Ht 65.0 in | Wt 153.6 lb

## 2024-07-12 DIAGNOSIS — I70213 Atherosclerosis of native arteries of extremities with intermittent claudication, bilateral legs: Secondary | ICD-10-CM

## 2024-07-12 DIAGNOSIS — I6523 Occlusion and stenosis of bilateral carotid arteries: Secondary | ICD-10-CM

## 2024-07-12 DIAGNOSIS — I10 Essential (primary) hypertension: Secondary | ICD-10-CM

## 2024-07-12 DIAGNOSIS — I6521 Occlusion and stenosis of right carotid artery: Secondary | ICD-10-CM

## 2024-07-12 DIAGNOSIS — E785 Hyperlipidemia, unspecified: Secondary | ICD-10-CM | POA: Diagnosis not present

## 2024-07-12 DIAGNOSIS — I724 Aneurysm of artery of lower extremity: Secondary | ICD-10-CM

## 2024-07-12 NOTE — Progress Notes (Signed)
 MRN : [id number]  Dennis Preston is a 73 y.o. ([birth year]) male who presents with chief complaint of  Chief Complaint  Patient presents with   Follow-up    6 month follow up ABI + Carotid   .  History of Present Illness: Patient returns today in follow up of multiple vascular issues.  He says he is doing about the same.  He is using oxygen today but says he usually just uses it at night.  When he comes to the doctor he says he needs it more.  He says his legs feel about the same.  He does still have some pain with walking but nothing severe.  He has a lot of neuropathy type symptoms.  His ABIs today are actually slightly improved on both sides up to 0.65 on the right and 0.73 on the left.  Previously these were 0.57 on the right and 0.53 on the left. He also had a previous left femoral aneurysm repair.  No current wound issues or problems on the left leg.  No swelling in the legs. He is also followed for carotid disease.  No focal neurologic symptoms since his last visit. Specifically, the patient denies amaurosis fugax, speech or swallowing difficulties, or arm or leg weakness or numbness. Duplex today shows a patent right carotid endarterectomy and 60 to 79% left ICA stenosis.    Current Outpatient Medications  Medication Sig Dispense Refill   acetaminophen  (TYLENOL ) 325 MG tablet Take 2 tablets (650 mg total) by mouth every 4 (four) hours as needed for headache or mild pain (pain score 1-3).     atorvastatin  (LIPITOR ) 80 MG tablet Take 1 tablet (80 mg total) by mouth daily at 6 PM.     clopidogrel  (PLAVIX ) 75 MG tablet Take 75 mg by mouth in the morning.     colchicine 0.6 MG tablet Take 0.6 mg by mouth daily as needed (gout flare).     diazepam  (VALIUM ) 5 MG tablet Take 1 tablet (5 mg total) by mouth at bedtime.     diltiazem  (CARDIZEM  CD) 180 MG 24 hr capsule Take 1 capsule (180 mg total) by mouth in the morning. 60 capsule 6   Fluticasone -Umeclidin-Vilant (TRELEGY ELLIPTA)  100-62.5-25 MCG/INH AEPB Inhale 1 puff into the lungs in the morning.     furosemide  (LASIX ) 40 MG tablet Take 40 mg in the morning and 20 mg (1/2 tablet) in the evening (Patient taking differently: Take 60 mg by mouth every morning.)     levalbuterol  (XOPENEX ) 1.25 MG/3ML nebulizer solution Take 1.25 mg by nebulization every 6 (six) hours as needed for wheezing or shortness of breath.     OXYGEN Inhale 2 L into the lungs at bedtime. Uses while sleeping     pantoprazole  (PROTONIX ) 40 MG tablet Take 1 tablet (40 mg total) by mouth daily. 30 tablet 1   spironolactone  (ALDACTONE ) 25 MG tablet Take 0.5 tablets (12.5 mg total) by mouth daily. 30 tablet 3   warfarin (COUMADIN ) 1 MG tablet Take 1.5 mg by mouth See admin instructions. Take 1.5 tablets (1.5mg ) by mouth at bedtime on Saturday and Sunday     warfarin (COUMADIN ) 2 MG tablet Take 2 mg by mouth See admin instructions. Take 1 tablet (2mg ) by mouth at bedtime Monday - Friday.     No current facility-administered medications for this visit.    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, SVG-OM1, SVG-PDA, SVG-PL   Hyperlipemia    Hypertension    Hypotension 07/14/2015   Leucocytosis    Liver dysfunction    Long term current use of clopidogrel     Non-ST elevation (NSTEMI) myocardial infarction (HCC) 07/10/2015   NSTEMI (non-ST elevated myocardial infarction) (HCC)    approx 2016   Peripheral  artery disease    legs   Tobacco abuse    Wears dentures    full upper and lower    Past Surgical History:  Procedure Laterality Date   A-FLUTTER ABLATION N/A 02/18/2024   Procedure: A-FLUTTER ABLATION;  Surgeon: Kennyth Chew, MD;  [facility]: [facility] INVASIVE CV LAB;  Service: Cardiovascular;  Laterality: N/A;   APPLICATION OF WOUND VAC Right 06/22/2020   Procedure: APPLICATION OF WOUND VAC;  Surgeon: Marea Selinda RAMAN, MD;  Location: [facility] ORS;  Service: Vascular;  Laterality: Right;   ATRIAL FIBRILLATION ABLATION N/A 02/18/2024   Procedure: ATRIAL FIBRILLATION ABLATION;  Surgeon: Kennyth Chew, MD;  Location: [facility] INVASIVE CV LAB;  Service: Cardiovascular;  Laterality: N/A;   CARDIAC CATHETERIZATION N/A 07/10/2015   Procedure: Right/Left Heart Cath and Coronary Angiography;  Surgeon: Cara JONETTA Lovelace, MD;  Location: [facility] INVASIVE CV LAB;  Service: Cardiovascular;  Laterality: N/A;   CARDIOVERSION N/A 10/14/2022   Procedure: CARDIOVERSION;  Surgeon: Lovelace Cara JONETTA, MD;  Location: [facility] ORS;  Service: Cardiovascular;  Laterality: N/A;   CARDIOVERSION N/A 12/28/2023   Procedure: CARDIOVERSION;  Surgeon: Barbaraann Darryle Ned, MD;  Location: [facility] INVASIVE CV LAB;  Service: Cardiovascular;  Laterality: N/A;   CORONARY ARTERY BYPASS GRAFT  07/13/2015   [facility].  4 vessel   ENDARTERECTOMY Right 01/02/2021   Procedure: ENDARTERECTOMY CAROTID;  Surgeon: Marea Selinda RAMAN, MD;  Location: [facility] ORS;  Service: Vascular;  Laterality: Right;   ENDARTERECTOMY FEMORAL Bilateral 05/23/2020   Procedure: ENDARTERECTOMY FEMORAL BILATERAL SUPERFICIAL FEMORAL ARTERY STENTS ;  Surgeon: Jama Cordella MATSU, MD;  Location: [facility] ORS;  Service: Vascular;  Laterality: Bilateral;   FALSE ANEURYSM REPAIR Left 07/22/2020   Procedure: REPAIR LEFT FEMORAL PSEUDOANUERYSM;  Surgeon: Tisa Curry LABOR, MD;  Location: [facility] ORS;  Service: Vascular;  Laterality: Left;   INSERTION OF ILIAC STENT Left 05/23/2020   Procedure: INSERTION OF ILIAC STENT;   Surgeon: Jama Cordella MATSU, MD;  Location: [facility] ORS;  Service: Vascular;  Laterality: Left;   LEFT HEART CATH AND CORONARY ANGIOGRAPHY N/A 09/16/2022   Procedure: LEFT HEART CATH AND CORONARY ANGIOGRAPHY;  Surgeon: Burnard Ned LABOR, MD;  Location: [facility] INVASIVE CV LAB;  Service: Cardiovascular;  Laterality: N/A;   LEFT HEART CATH AND CORS/GRAFTS ANGIOGRAPHY N/A 01/23/2020   Procedure: LEFT HEART CATH AND CORS/GRAFTS ANGIOGRAPHY;  Surgeon: Lovelace Cara JONETTA, MD;  Location: [facility] INVASIVE CV LAB;  Service: Cardiovascular;  Laterality: N/A;   LEFT HEART CATH AND CORS/GRAFTS ANGIOGRAPHY N/A 02/18/2022   Procedure: LEFT HEART CATH AND CORS/GRAFTS ANGIOGRAPHY;  Surgeon: Lawyer Bernardino Cough, MD;  Location: [facility] INVASIVE CV LAB;  Service: Cardiovascular;  Laterality: N/A;   LOWER EXTREMITY ANGIOGRAPHY Right 04/03/2020   Procedure: LOWER EXTREMITY ANGIOGRAPHY;  Surgeon: Jama Cordella MATSU, MD;  Location: [facility] INVASIVE CV LAB;  Service: Cardiovascular;  Laterality: Right;   OLECRANON BURSECTOMY Left 05/06/2019   Procedure: OLECRANON BURSECTOMY AND DEBRIDEMENT;  Surgeon: Tobie Priest, MD;  Location: [facility] ORS;  Service: Orthopedics;  Laterality: Left;   TRANSESOPHAGEAL ECHOCARDIOGRAM (CATH LAB) N/A 12/28/2023   Procedure: TRANSESOPHAGEAL ECHOCARDIOGRAM;  Surgeon: Barbaraann Darryle Ned, MD;  Location: [facility] INVASIVE CV LAB;  Service: Cardiovascular;  Laterality: N/A;   WOUND DEBRIDEMENT Right 06/22/2020   Procedure: DEBRIDEMENT WOUND RIGHT GROIN;  Surgeon: Marea Selinda RAMAN, MD;  Location: [facility] ORS;  Service: Vascular;  Laterality: Right;     Social History   Tobacco Use   Smoking status: Former    Current packs/day: 2.50    Average packs/day: 2.5 packs/day for 54.0 years (135.0 ttl pk-yrs)    Types: Cigarettes   Smokeless tobacco: Never   Tobacco comments:    0.5PPD 09/17/21  Vaping Use   Vaping status: Never Used  Substance Use Topics   Alcohol use: Not Currently    Comment: quit over 15 yrs ago   Drug use:  Never      Family History  Problem Relation Age of Onset   Depression Sister   No bleeding or clotting disorders No aneurysms  Allergies  Allergen Reactions   Aspirin  Swelling    REACTION: reaction not known     REVIEW OF SYSTEMS (Negative unless checked)   Constitutional: [] Weight loss  [] Fever  [] Chills Cardiac: [] Chest pain   [] Chest pressure   [] Palpitations   [] Shortness of breath when laying flat   [x] Shortness of breath at rest   [x] Shortness of breath with exertion. Vascular:  [x] Pain in legs with walking   [] Pain in legs at rest   [] Pain in legs when laying flat   [x] Claudication   [] Pain in feet when walking  [] Pain in feet at rest  [] Pain in feet when laying flat   [] History of DVT   [] Phlebitis   [] Swelling in legs   [] Varicose veins   [] Non-healing ulcers Pulmonary:   [] Uses home oxygen   [] Productive cough   [] Hemoptysis   [] Wheeze  [x] COPD   [] Asthma Neurologic:  [] Dizziness  [] Blackouts   [] Seizures   [] History of stroke   [] History of TIA  [] Aphasia   [] Temporary blindness   [] Dysphagia   [] Weakness or numbness in arms   [] Weakness or numbness in legs Musculoskeletal:  [x] Arthritis   [] Joint swelling   [x] Joint pain   [] Low back pain Hematologic:  [] Easy bruising  [] Easy bleeding   [] Hypercoagulable state   [] Anemic  [] Hepatitis Gastrointestinal:  [] Blood in stool   [] Vomiting blood  [] Gastroesophageal reflux/heartburn   [] Difficulty swallowing. Genitourinary:  [] Chronic kidney disease   [] Difficult urination  [] Frequent urination  [] Burning with urination   [] Blood in urine Skin:  [] Rashes   [] Ulcers   [] Wounds Psychological:  [] History of anxiety   []  History of major depression.  Physical Examination  BP 122/74 (BP Location: Right Arm, Patient Position: Sitting, Cuff Size: Normal)   Pulse 76   Resp 18   Ht 5' 5 (1.651 m)   Wt 153 lb 9.6 oz (69.7 kg)   BMI 25.56 kg/m  Gen:  WD/WN, NAD Head: [place]/AT, No temporalis wasting. Ear/Nose/Throat: Hearing grossly  intact, nares w/o erythema or drainage Eyes: Conjunctiva clear. Sclera non-icteric Neck: Supple.  Trachea midline Pulmonary:  Good  air movement, no use of accessory muscles on supplemental oxygen.  Cardiac: irregular Vascular:  Vessel Right Left  Radial Palpable Palpable                          PT 1+ Palpable 1+Palpable  DP 1+ Palpable 1+ Palpable   Gastrointestinal: soft, non-tender/non-distended. No guarding/reflex.  Musculoskeletal: M/S 5/5 throughout.  No deformity or atrophy. In a wheelchair. No LE edema. Neurologic: Sensation grossly intact in extremities.  Symmetrical.  Speech is fluent.  Psychiatric: Judgment intact, Mood & affect appropriate for pt's clinical situation. Dermatologic: No rashes or ulcers noted.  No cellulitis or open wounds.      Labs Recent Results (from the past 2160 hours)  CBC     Status: Abnormal   Collection Time: 05/15/24  2:34 AM  Result Value Ref Range   WBC 21.6 (H) 4.0 - 10.5 K/uL   RBC 4.89 4.22 - 5.81 MIL/uL   Hemoglobin 15.0 13.0 - 17.0 g/dL   HCT 54.6 60.9 - 47.9 %   MCV 92.6 80.0 - 100.0 fL   MCH 30.7 26.0 - 34.0 pg   MCHC 33.1 30.0 - 36.0 g/dL   RDW 85.6 88.4 - 84.4 %   Platelets 195 150 - 400 K/uL   nRBC 0.0 0.0 - 0.2 %    Comment: Performed at [facility] Lab, [address] [place], [place] [address]  Basic metabolic panel     Status: Abnormal   Collection Time: 05/15/24  2:34 AM  Result Value Ref Range   Sodium 137 135 - 145 mmol/L   Potassium 3.6 3.5 - 5.1 mmol/L   Chloride 98 98 - 111 mmol/L   CO2 30 22 - 32 mmol/L   Glucose, Bld 144 (H) 70 - 99 mg/dL    Comment: Glucose reference range applies only to samples taken after fasting for at least 8 hours.   BUN 11 8 - 23 mg/dL   Creatinine, Ser 8.91 0.61 - 1.24 mg/dL   Calcium  9.2 8.9 - 10.3 mg/dL   GFR, Estimated >39 >39 mL/min    Comment: (NOTE) Calculated using the CKD-EPI Creatinine Equation (2021)    Anion gap 9 5 - 15    Comment: Performed at [facility] Lab, [address] [place], [place] [address]  Troponin I (High Sensitivity)     Status: Abnormal   Collection Time: 05/15/24  2:34 AM  Result Value Ref Range   Troponin I (High Sensitivity) 30 (H) <18 ng/L    Comment: (NOTE) Elevated high sensitivity troponin I (hsTnI) values and significant  changes across serial measurements may suggest ACS but many other  chronic and acute conditions are known to elevate hsTnI results.  Refer to the Links section for chest pain algorithms and additional  guidance. Performed at [facility], [address] [place], [place] [address]   Brain natriuretic peptide     Status: Abnormal   Collection Time: 05/15/24  2:34 AM  Result Value Ref Range   B Natriuretic Peptide 101.4 (H) 0.0 - 100.0 pg/mL    Comment: Performed at [facility] Lab, [address] [place], [place] [address]  Protime-INR     Status: Abnormal   Collection Time: 05/15/24  2:34 AM  Result Value Ref Range   Prothrombin Time 22.9 (H) 11.4 - 15.2 seconds   INR 1.9 (H) 0.8 - 1.2    Comment: (NOTE) INR goal varies based  on device and disease states. Performed at [facility], [address] [place], [place] [address]   Resp panel by RT-PCR (RSV, Flu A&B, Covid) Anterior Nasal Swab     Status: None   Collection Time: 05/15/24  2:50 AM   Specimen: Anterior Nasal Swab  Result Value Ref Range   SARS Coronavirus 2 by RT PCR NEGATIVE NEGATIVE   Influenza A by PCR NEGATIVE NEGATIVE   Influenza B by PCR NEGATIVE NEGATIVE    Comment: (NOTE) The Xpert Xpress SARS-CoV-2/FLU/RSV plus assay is intended as an aid in the diagnosis of influenza from Nasopharyngeal swab specimens and should not be used as a sole basis for treatment. Nasal washings and aspirates are unacceptable for Xpert Xpress SARS-CoV-2/FLU/RSV testing.  Fact Sheet for Patients: BloggerCourse.com  Fact Sheet for Healthcare  Providers: SeriousBroker.it  This test is not yet approved or cleared by the [place]  FDA and has been authorized for detection and/or diagnosis of SARS-CoV-2 by FDA under an Emergency Use Authorization (EUA). This EUA will remain in effect (meaning this test can be used) for the duration of the COVID-19 declaration under Section 564(b)(1) of the Act, 21 U.S.C. section 360bbb-3(b)(1), unless the authorization is terminated or revoked.     Resp Syncytial Virus by PCR NEGATIVE NEGATIVE    Comment: (NOTE) Fact Sheet for Patients: BloggerCourse.com  Fact Sheet for Healthcare Providers: SeriousBroker.it  This test is not yet approved or cleared by the [place]  FDA and has been authorized for detection and/or diagnosis of SARS-CoV-2 by FDA under an Emergency Use Authorization (EUA). This EUA will remain in effect (meaning this test can be used) for the duration of the COVID-19 declaration under Section 564(b)(1) of the Act, 21 U.S.C. section 360bbb-3(b)(1), unless the authorization is terminated or revoked.  Performed at [facility] Lab, [address] [place], [place] [address]   I-Stat venous blood gas, [facility], [facility], [facility])     Status: Abnormal   Collection Time: 05/15/24  3:18 AM  Result Value Ref Range   pH, Ven 7.367 7.25 - 7.43   pCO2, Ven 58.5 44 - 60 mmHg   pO2, Ven 86 (H) 32 - 45 mmHg   Bicarbonate 33.6 (H) 20.0 - 28.0 mmol/L   TCO2 35 (H) 22 - 32 mmol/L   O2 Saturation 96 %   Acid-Base Excess 6.0 (H) 0.0 - 2.0 mmol/L   Sodium 138 135 - 145 mmol/L   Potassium 3.7 3.5 - 5.1 mmol/L   Calcium , Ion 1.15 1.15 - 1.40 mmol/L   HCT 46.0 39.0 - 52.0 %   Hemoglobin 15.6 13.0 - 17.0 g/dL   Sample type VENOUS   Troponin I (High Sensitivity)     Status: Abnormal   Collection Time: 05/15/24  6:33 AM  Result Value Ref Range   Troponin I (High Sensitivity) 28 (H) <18 ng/L    Comment:  (NOTE) Elevated high sensitivity troponin I (hsTnI) values and significant  changes across serial measurements may suggest ACS but many other  chronic and acute conditions are known to elevate hsTnI results.  Refer to the Links section for chest pain algorithms and additional  guidance. Performed at [facility] Lab, [address], [place], [place] [address]   Troponin I (High Sensitivity)     Status: Abnormal   Collection Time: 05/15/24 10:59 AM  Result Value Ref Range   Troponin I (High Sensitivity) 23 (H) <18 ng/L    Comment: (NOTE) Elevated high sensitivity troponin I (  hsTnI) values and significant  changes across serial measurements may suggest ACS but many other  chronic and acute conditions are known to elevate hsTnI results.  Refer to the Links section for chest pain algorithms and additional  guidance. Performed at [facility] Lab, [address], [place], [place] [address]   D-dimer, quantitative     Status: None   Collection Time: 05/15/24 10:59 AM  Result Value Ref Range   D-Dimer, Quant <0.27 0.00 - 0.50 ug/mL-FEU    Comment: (NOTE) At the manufacturer cut-off value of 0.5 g/mL FEU, this assay has a negative predictive value of 95-100%.This assay is intended for use in conjunction with a clinical pretest probability (PTP) assessment model to exclude pulmonary embolism (PE) and deep venous thrombosis (DVT) in outpatients suspected of PE or DVT. Results should be correlated with clinical presentation. Performed at [facility] Lab, [address], [place], [place] [address]   C-reactive protein     Status: Abnormal   Collection Time: 05/15/24 10:59 AM  Result Value Ref Range   CRP 11.7 (H) <1.0 mg/dL    Comment: Performed at [facility], [address] [place], [place] [address]  Hepatic function panel     Status: Abnormal   Collection Time: 05/15/24 10:59 AM  Result Value Ref Range   Total Protein 6.3 (L) 6.5 - 8.1 g/dL   Albumin  3.4 (L)  3.5 - 5.0 g/dL   AST 27 15 - 41 U/L   ALT 15 0 - 44 U/L   Alkaline Phosphatase 76 38 - 126 U/L   Total Bilirubin 0.7 0.0 - 1.2 mg/dL   Bilirubin, Direct 0.2 0.0 - 0.2 mg/dL   Indirect Bilirubin 0.5 0.3 - 0.9 mg/dL    Comment: Performed at [facility], [address] [place], [place] [address]  Respiratory (~20 pathogens) panel by PCR     Status: None   Collection Time: 05/15/24  7:51 PM   Specimen: Nasopharyngeal Swab; Respiratory  Result Value Ref Range   Adenovirus NOT DETECTED NOT DETECTED   Coronavirus 229E NOT DETECTED NOT DETECTED    Comment: (NOTE) The Coronavirus on the Respiratory Panel, DOES NOT test for the novel  Coronavirus (2019 nCoV)    Coronavirus HKU1 NOT DETECTED NOT DETECTED   Coronavirus NL63 NOT DETECTED NOT DETECTED   Coronavirus OC43 NOT DETECTED NOT DETECTED   Metapneumovirus NOT DETECTED NOT DETECTED   Rhinovirus / Enterovirus NOT DETECTED NOT DETECTED   Influenza A NOT DETECTED NOT DETECTED   Influenza B NOT DETECTED NOT DETECTED   Parainfluenza Virus 1 NOT DETECTED NOT DETECTED   Parainfluenza Virus 2 NOT DETECTED NOT DETECTED   Parainfluenza Virus 3 NOT DETECTED NOT DETECTED   Parainfluenza Virus 4 NOT DETECTED NOT DETECTED   Respiratory Syncytial Virus NOT DETECTED NOT DETECTED   Bordetella pertussis NOT DETECTED NOT DETECTED   Bordetella Parapertussis NOT DETECTED NOT DETECTED   Chlamydophila pneumoniae NOT DETECTED NOT DETECTED   Mycoplasma pneumoniae NOT DETECTED NOT DETECTED    Comment: Performed at [facility] Lab, [address], [place], [place] [address]  CBC     Status: Abnormal   Collection Time: 05/16/24  5:00 AM  Result Value Ref Range   WBC 19.3 (H) 4.0 - 10.5 K/uL   RBC 4.82 4.22 - 5.81 MIL/uL   Hemoglobin 14.6 13.0 - 17.0 g/dL   HCT 55.5 60.9 - 47.9 %   MCV 92.1 80.0 - 100.0 fL   MCH  30.3 26.0 - 34.0 pg   MCHC 32.9 30.0 - 36.0 g/dL   RDW 85.5 88.4 - 84.4 %   Platelets 200 150 - 400 K/uL   nRBC 0.0 0.0 - 0.2 %     Comment: Performed at [facility] Lab, [address], [place], [place] [address]  Basic metabolic panel     Status: Abnormal   Collection Time: 05/16/24  5:00 AM  Result Value Ref Range   Sodium 140 135 - 145 mmol/L   Potassium 4.4 3.5 - 5.1 mmol/L    Comment: DELTA CHECK NOTED   Chloride 98 98 - 111 mmol/L   CO2 35 (H) 22 - 32 mmol/L   Glucose, Bld 136 (H) 70 - 99 mg/dL    Comment: Glucose reference range applies only to samples taken after fasting for at least 8 hours.   BUN 15 8 - 23 mg/dL   Creatinine, Ser 9.04 0.61 - 1.24 mg/dL   Calcium  8.9 8.9 - 10.3 mg/dL   GFR, Estimated >39 >39 mL/min    Comment: (NOTE) Calculated using the CKD-EPI Creatinine Equation (2021)    Anion gap 7 5 - 15    Comment: ELECTROLYTES REPEATED TO VERIFY Performed at [facility] Lab, [address] [place], [place] [address]   Protime-INR     Status: Abnormal   Collection Time: 05/16/24  5:00 AM  Result Value Ref Range   Prothrombin Time 27.6 (H) 11.4 - 15.2 seconds   INR 2.4 (H) 0.8 - 1.2    Comment: (NOTE) INR goal varies based on device and disease states. Performed at [facility], [address], [place], [place] [address]   Procalcitonin     Status: None   Collection Time: 05/17/24  5:34 AM  Result Value Ref Range   Procalcitonin <0.10 ng/mL    Comment:        Interpretation: PCT (Procalcitonin) <= 0.5 ng/mL: Systemic infection (sepsis) is not likely. Local bacterial infection is possible. (NOTE)       Sepsis PCT Algorithm           Lower Respiratory Tract                                      Infection PCT Algorithm    ----------------------------     ----------------------------         PCT < 0.25 ng/mL                PCT < 0.10 ng/mL          Strongly encourage             Strongly discourage   discontinuation of antibiotics    initiation of antibiotics    ----------------------------     -----------------------------       PCT 0.25 - 0.50 ng/mL            PCT 0.10 -  0.25 ng/mL               OR       >80% decrease in PCT            Discourage initiation of  antibiotics      Encourage discontinuation           of antibiotics    ----------------------------     -----------------------------         PCT >= 0.50 ng/mL              PCT 0.26 - 0.50 ng/mL               AND        <80% decrease in PCT             Encourage initiation of                                             antibiotics       Encourage continuation           of antibiotics    ----------------------------     -----------------------------        PCT >= 0.50 ng/mL                  PCT > 0.50 ng/mL               AND         increase in PCT                  Strongly encourage                                      initiation of antibiotics    Strongly encourage escalation           of antibiotics                                     -----------------------------                                           PCT <= 0.25 ng/mL                                                 OR                                        > 80% decrease in PCT                                      Discontinue / Do not initiate                                             antibiotics  Performed at [facility] Lab, [address], [place], [place] [address]   CBC     Status: Abnormal   Collection Time: 05/17/24  5:34 AM  Result Value Ref Range   WBC 13.4 (H) 4.0 - 10.5 K/uL   RBC 4.46 4.22 - 5.81 MIL/uL   Hemoglobin 13.7 13.0 - 17.0 g/dL   HCT 58.5 60.9 - 47.9 %   MCV 92.8 80.0 - 100.0 fL   MCH 30.7 26.0 - 34.0 pg   MCHC 33.1 30.0 - 36.0 g/dL   RDW 85.4 88.4 - 84.4 %   Platelets 180 150 - 400 K/uL   nRBC 0.0 0.0 - 0.2 %    Comment: Performed at [facility] Lab, [address], [place], [place] [address]  Protime-INR     Status: Abnormal   Collection Time: 05/17/24  5:35 AM  Result Value Ref Range   Prothrombin Time 25.2 (H) 11.4 - 15.2 seconds   INR 2.2 (H)  0.8 - 1.2    Comment: (NOTE) INR goal varies based on device and disease states. Performed at [facility] Lab, [address], [place], [place] [address]   Basic metabolic panel with GFR     Status: Abnormal   Collection Time: 05/17/24  5:35 AM  Result Value Ref Range   Sodium 140 135 - 145 mmol/L   Potassium 4.1 3.5 - 5.1 mmol/L   Chloride 99 98 - 111 mmol/L   CO2 32 22 - 32 mmol/L   Glucose, Bld 117 (H) 70 - 99 mg/dL    Comment: Glucose reference range applies only to samples taken after fasting for at least 8 hours.   BUN 18 8 - 23 mg/dL   Creatinine, Ser 9.08 0.61 - 1.24 mg/dL   Calcium  8.8 (L) 8.9 - 10.3 mg/dL   GFR, Estimated >39 >39 mL/min    Comment: (NOTE) Calculated using the CKD-EPI Creatinine Equation (2021)    Anion gap 9 5 - 15    Comment: Performed at [facility], [address] [place], [place] [address]  Magnesium      Status: None   Collection Time: 05/17/24  5:35 AM  Result Value Ref Range   Magnesium  2.2 1.7 - 2.4 mg/dL    Comment: Performed at [facility], [address] [place], [place] [address]  Protime-INR     Status: Abnormal   Collection Time: 05/18/24  5:21 AM  Result Value Ref Range   Prothrombin Time 23.9 (H) 11.4 - 15.2 seconds   INR 2.0 (H) 0.8 - 1.2    Comment: (NOTE) INR goal varies based on device and disease states. Performed at [facility], [address] [place], [place] [address]     Radiology No results found.  Assessment/Plan  Atherosclerosis of native arteries of extremity with intermittent claudication His ABIs today are actually slightly improved on both sides up to 0.65 on the right and 0.73 on the left.  Previously these were 0.57 on the right and 0.53 on the left.  No limb threatening symptoms.  Continue current medical regimen.  Recheck in 6 months.  Carotid atherosclerosis, bilateral Duplex today shows a patent right carotid endarterectomy and 60 to 79% left ICA stenosis.  Will plan continued  medical regimen with Plavix  and Lipitor .  Recheck in 6 months.  Essential hypertension blood pressure control important in reducing the progression of atherosclerotic disease. On appropriate oral medications.     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 If we do any right lower extremity angiograms, access could be an issue.  Hyperlipidemia lipid control important in reducing the progression of atherosclerotic disease. Continue statin therapy  Selinda Gu, MD  07/12/2024 3:42 PM    This note was created with Dragon medical transcription system.  Any errors from dictation are purely unintentional

## 2024-07-12 NOTE — Assessment & Plan Note (Signed)
 His ABIs today are actually slightly improved on both sides up to 0.65 on the right and 0.73 on the left.  Previously these were 0.57 on the right and 0.53 on the left.  No limb threatening symptoms.  Continue current medical regimen.  Recheck in 6 months.

## 2024-07-12 NOTE — Assessment & Plan Note (Signed)
 Duplex today shows a patent right carotid endarterectomy and 60 to 79% left ICA stenosis.  Will plan continued medical regimen with Plavix  and Lipitor .  Recheck in 6 months.

## 2024-07-13 ENCOUNTER — Telehealth: Payer: Self-pay

## 2024-07-13 NOTE — Telephone Encounter (Signed)
 The patient saw Suzann Riddle recently and discussed LAAO. Dr. Kennyth and Dr. Santo reviewed CT imaging and [employer] report received (review 07/13/2024 phone note).  Dr. Santo reported it appeared the SVG graft to lateral territory was down. Suzann sent message to Dr. Florencio to notify.  Per Dr. Kennyth, called to offer Watchman consult. The patient declined at this time as he wishes to speak with Dr. Florencio first.  Encouraged the patient to keep that appointment and to discuss CT. He denied CP and stated he feels great. He will call after follow-up if he wants to proceed with consult.

## 2024-07-13 NOTE — Telephone Encounter (Addendum)
 Per Dr. Milan review of CT, Dennis Preston LABOR, MD  Dennis Preston LABOR, RN; Kennyth Chew, MD Average landing zone size 22 mm. No issues with transseptal puncture. The anatomy is challenging: you should have sufficient depth for a 27 mm Device.  Consider steerable catheter to navigate the distal lobe that tapers. I sent you both a capture of it.  Consider doing this with TEE assist.  Thanks, MAC    Per [employer]: Dennis Preston: Trabeculated chicken wing Max 23/ AVG 20/ Depth 16.5 Likely use a 24mm device Inf/Mid TSP

## 2024-07-14 LAB — VAS US ABI WITH/WO TBI
Left ABI: 0.73
Right ABI: 0.65

## 2024-07-18 DIAGNOSIS — I480 Paroxysmal atrial fibrillation: Secondary | ICD-10-CM | POA: Diagnosis not present

## 2024-07-18 DIAGNOSIS — Z7901 Long term (current) use of anticoagulants: Secondary | ICD-10-CM | POA: Diagnosis not present

## 2024-08-16 DIAGNOSIS — I10 Essential (primary) hypertension: Secondary | ICD-10-CM | POA: Diagnosis not present

## 2024-08-16 DIAGNOSIS — J4489 Other specified chronic obstructive pulmonary disease: Secondary | ICD-10-CM | POA: Diagnosis not present

## 2024-08-16 DIAGNOSIS — I480 Paroxysmal atrial fibrillation: Secondary | ICD-10-CM | POA: Diagnosis not present

## 2024-08-16 DIAGNOSIS — D72829 Elevated white blood cell count, unspecified: Secondary | ICD-10-CM | POA: Diagnosis not present

## 2024-08-18 NOTE — Telephone Encounter (Signed)
 Spoke with patient to get an update. He still wishes to speak with Dr. Florencio. He is going to see him 10/20/24. He is going to try and call him instead sooner.  Advised patient would check in with him next week.

## 2024-09-08 ENCOUNTER — Telehealth: Payer: Self-pay | Admitting: Cardiology

## 2024-09-08 DIAGNOSIS — I700 Atherosclerosis of aorta: Secondary | ICD-10-CM | POA: Diagnosis not present

## 2024-09-08 DIAGNOSIS — Z9889 Other specified postprocedural states: Secondary | ICD-10-CM | POA: Diagnosis not present

## 2024-09-08 DIAGNOSIS — F411 Generalized anxiety disorder: Secondary | ICD-10-CM | POA: Diagnosis not present

## 2024-09-08 DIAGNOSIS — I10 Essential (primary) hypertension: Secondary | ICD-10-CM | POA: Diagnosis not present

## 2024-09-08 DIAGNOSIS — J9611 Chronic respiratory failure with hypoxia: Secondary | ICD-10-CM | POA: Diagnosis not present

## 2024-09-08 DIAGNOSIS — Z951 Presence of aortocoronary bypass graft: Secondary | ICD-10-CM | POA: Diagnosis not present

## 2024-09-08 DIAGNOSIS — J449 Chronic obstructive pulmonary disease, unspecified: Secondary | ICD-10-CM | POA: Diagnosis not present

## 2024-09-08 NOTE — Telephone Encounter (Signed)
 Called and spoke with the patient.  Patient wanted to let Dr. Kennyth and Chantal Needle, NP know that patient had a visit with Dr. Florencio yesterday.  At this visit, the Watchman procedure was discussed and according to the patient, Dr. Florencio is ok with the patient getting this procedure.  Patient states that Dr. Florencio was supposed to have sent a message to Suzann Riddle, NP stating the same.

## 2024-09-08 NOTE — Telephone Encounter (Signed)
 Pt called in asking to speak with nurse about a watchman procedure.

## 2024-09-09 NOTE — Telephone Encounter (Signed)
 Arranged Watchman consult with Dr. Kennyth in [facility] 11/02/24 with patient.

## 2024-09-21 ENCOUNTER — Other Ambulatory Visit: Payer: Self-pay

## 2024-09-21 ENCOUNTER — Emergency Department (HOSPITAL_COMMUNITY)

## 2024-09-21 ENCOUNTER — Encounter (HOSPITAL_COMMUNITY): Payer: Self-pay | Admitting: Emergency Medicine

## 2024-09-21 ENCOUNTER — Emergency Department (HOSPITAL_COMMUNITY)
Admission: EM | Admit: 2024-09-21 | Discharge: 2024-09-21 | Disposition: A | Discharge status: Home / Self Care | Attending: Emergency Medicine | Admitting: Emergency Medicine

## 2024-09-21 DIAGNOSIS — J441 Chronic obstructive pulmonary disease with (acute) exacerbation: Secondary | ICD-10-CM | POA: Diagnosis not present

## 2024-09-21 DIAGNOSIS — Z7901 Long term (current) use of anticoagulants: Secondary | ICD-10-CM | POA: Diagnosis not present

## 2024-09-21 DIAGNOSIS — I4891 Unspecified atrial fibrillation: Secondary | ICD-10-CM | POA: Diagnosis not present

## 2024-09-21 DIAGNOSIS — I10 Essential (primary) hypertension: Secondary | ICD-10-CM | POA: Insufficient documentation

## 2024-09-21 DIAGNOSIS — R0602 Shortness of breath: Secondary | ICD-10-CM | POA: Diagnosis present

## 2024-09-21 DIAGNOSIS — I2510 Atherosclerotic heart disease of native coronary artery without angina pectoris: Secondary | ICD-10-CM | POA: Diagnosis not present

## 2024-09-21 DIAGNOSIS — N179 Acute kidney failure, unspecified: Secondary | ICD-10-CM | POA: Diagnosis not present

## 2024-09-21 LAB — PROTIME-INR
INR: 2.2 — ABNORMAL HIGH (ref 0.8–1.2)
Prothrombin Time: 25.2 s — ABNORMAL HIGH (ref 11.4–15.2)

## 2024-09-21 LAB — COMPREHENSIVE METABOLIC PANEL WITH GFR
ALT: 31 U/L (ref 0–44)
AST: 30 U/L (ref 15–41)
Albumin: 4.3 g/dL (ref 3.5–5.0)
Alkaline Phosphatase: 84 U/L (ref 38–126)
Anion gap: 11 (ref 5–15)
BUN: 20 mg/dL (ref 8–23)
CO2: 28 mmol/L (ref 22–32)
Calcium: 9.1 mg/dL (ref 8.9–10.3)
Chloride: 99 mmol/L (ref 98–111)
Creatinine, Ser: 1.34 mg/dL — ABNORMAL HIGH (ref 0.61–1.24)
GFR, Estimated: 56 mL/min — ABNORMAL LOW
Glucose, Bld: 113 mg/dL — ABNORMAL HIGH (ref 70–99)
Potassium: 4.4 mmol/L (ref 3.5–5.1)
Sodium: 138 mmol/L (ref 135–145)
Total Bilirubin: 0.3 mg/dL (ref 0.0–1.2)
Total Protein: 6.8 g/dL (ref 6.5–8.1)

## 2024-09-21 LAB — CBC WITH DIFFERENTIAL/PLATELET
Abs Immature Granulocytes: 0.05 K/uL (ref 0.00–0.07)
Basophils Absolute: 0 K/uL (ref 0.0–0.1)
Basophils Relative: 0 %
Eosinophils Absolute: 0.1 K/uL (ref 0.0–0.5)
Eosinophils Relative: 1 %
HCT: 49 % (ref 39.0–52.0)
Hemoglobin: 16 g/dL (ref 13.0–17.0)
Immature Granulocytes: 0 %
Lymphocytes Relative: 7 %
Lymphs Abs: 0.9 K/uL (ref 0.7–4.0)
MCH: 30.5 pg (ref 26.0–34.0)
MCHC: 32.7 g/dL (ref 30.0–36.0)
MCV: 93.3 fL (ref 80.0–100.0)
Monocytes Absolute: 1.2 K/uL — ABNORMAL HIGH (ref 0.1–1.0)
Monocytes Relative: 9 %
Neutro Abs: 11.1 K/uL — ABNORMAL HIGH (ref 1.7–7.7)
Neutrophils Relative %: 83 %
Platelets: 258 K/uL (ref 150–400)
RBC: 5.25 MIL/uL (ref 4.22–5.81)
RDW: 14.6 % (ref 11.5–15.5)
WBC: 13.4 K/uL — ABNORMAL HIGH (ref 4.0–10.5)
nRBC: 0 % (ref 0.0–0.2)

## 2024-09-21 LAB — RESP PANEL BY RT-PCR (RSV, FLU A&B, COVID)  RVPGX2
Influenza A by PCR: NEGATIVE
Influenza B by PCR: NEGATIVE
Resp Syncytial Virus by PCR: NEGATIVE
SARS Coronavirus 2 by RT PCR: NEGATIVE

## 2024-09-21 LAB — TROPONIN T, HIGH SENSITIVITY
Troponin T High Sensitivity: 25 ng/L — ABNORMAL HIGH (ref 0–19)
Troponin T High Sensitivity: 19 ng/L (ref 0–19)

## 2024-09-21 LAB — I-STAT CG4 LACTIC ACID, ED
Lactic Acid, Venous: 1.2 mmol/L (ref 0.5–1.9)
Lactic Acid, Venous: 1.1 mmol/L (ref 0.5–1.9)

## 2024-09-21 LAB — PRO BRAIN NATRIURETIC PEPTIDE: Pro Brain Natriuretic Peptide: 152 pg/mL

## 2024-09-21 MED ORDER — PREDNISONE 20 MG PO TABS: 50.0000 mg | ORAL_TABLET | Freq: Every day | ORAL | 0 refills | Status: AC

## 2024-09-21 MED ORDER — AZITHROMYCIN 250 MG PO TABS: 250.0000 mg | ORAL_TABLET | Freq: Every day | ORAL | 0 refills | Status: AC

## 2024-09-21 MED ORDER — IPRATROPIUM-ALBUTEROL 0.5-2.5 (3) MG/3ML IN SOLN
3.0000 mL | Freq: Once | RESPIRATORY_TRACT | Status: AC
  Administered 2024-09-21: 3 mL via RESPIRATORY_TRACT
  Filled 2024-09-21 (×2): qty 3

## 2024-09-21 MED ORDER — SODIUM CHLORIDE 0.9 % IV BOLUS
500.0000 mL | Freq: Once | INTRAVENOUS | Status: AC
  Administered 2024-09-21: 500 mL via INTRAVENOUS

## 2024-09-21 MED ORDER — METHYLPREDNISOLONE SODIUM SUCC 40 MG IJ SOLR
40.0000 mg | Freq: Once | INTRAMUSCULAR | Status: AC
  Administered 2024-09-21: 40 mg via INTRAVENOUS
  Filled 2024-09-21: qty 1

## 2024-09-21 NOTE — ED Provider Notes (Signed)
 " [place] EMERGENCY DEPARTMENT AT [facility] Provider Note   CSN: [id number] Arrival date & time: 09/21/24  1340     Patient presents with: No chief complaint on file.   Dennis Preston is a 73 y.o. male with past medical history of COPD, PAD, coronary artery disease, chronic venous insufficiency, hypertension, hyperlipidemia, A-fib, who presents emergency department for evaluation of shortness of breath and weakness.  Patient reports that he was treated for pneumonia by his pulmonologist a couple of weeks ago and was reportedly feeling better.  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  Patient received 650 mg of Tylenol  en route, for reported fever.  Patient reports that he sometimes wears oxygen at night, but not during the day.  Patient was initially complaining of a headache, chest pain, shortness of breath, but that has since improved since receiving Tylenol  from EMS.  At this time, patient is currently denying shortness of breath.  HPI     Prior to Admission medications  Medication Sig Start Date End Date Taking? Authorizing Provider  azithromycin  (ZITHROMAX  Z-PAK) 250 MG tablet Take 1 tablet (250 mg total) by mouth daily for 5 days. 09/21/24 09/26/24 Yes Torsten Weniger, Marry RAMAN, PA-C  predniSONE  (DELTASONE ) 20 MG tablet Take 2.5 tablets (50 mg total) by mouth daily. 09/21/24  Yes Cybele Maule, Marry RAMAN, PA-C  acetaminophen  (TYLENOL ) 325 MG tablet Take 2 tablets (650 mg total) by mouth every 4 (four) hours as needed for headache or mild pain (pain score 1-3). 02/19/24   Lesia Ozell Barter, PA-C  atorvastatin  (LIPITOR ) 80 MG tablet Take 1 tablet (80 mg total) by mouth daily at 6 PM. 07/10/15   Sainani, Vivek J, MD  clopidogrel  (PLAVIX ) 75 MG tablet Take 75 mg by mouth in the morning. 04/18/16   [provider]  colchicine 0.6 MG tablet Take 0.6 mg by mouth daily as needed (gout flare).     [provider]  diazepam  (VALIUM ) 5 MG tablet Take 1 tablet (5 mg total) by mouth at bedtime. 08/26/23   Joshua Domino, DO  diltiazem  (CARDIZEM  CD) 180 MG 24 hr capsule Take 1 capsule (180 mg total) by mouth in the morning. 02/19/24   Lesia Ozell Barter, PA-C  Fluticasone -Umeclidin-Vilant (TRELEGY ELLIPTA) 100-62.5-25 MCG/INH AEPB Inhale 1 puff into the lungs in the morning. 01/27/20   [provider]  furosemide  (LASIX ) 40 MG tablet Take 40 mg in the morning and 20 mg (1/2 tablet) in the evening Patient taking differently: Take 60 mg by mouth every morning. 08/26/23   Joshua Domino, DO  levalbuterol  (XOPENEX ) 1.25 MG/3ML nebulizer solution Take 1.25 mg by nebulization every 6 (six) hours as needed for wheezing or shortness of breath. 12/28/23   Rai, Nydia POUR, MD  OXYGEN Inhale 2 L into the lungs at bedtime. Uses while sleeping    [provider]  pantoprazole  (PROTONIX ) 40 MG tablet Take 1 tablet (40 mg total) by mouth daily. 05/18/24   Ghimire, Donalda HERO, MD  spironolactone  (ALDACTONE ) 25 MG tablet Take 0.5 tablets (12.5 mg total) by mouth daily. 11/26/23   Rai, Nydia POUR, MD  warfarin (COUMADIN ) 1 MG tablet Take 1.5 mg by mouth See admin instructions. Take 1.5 tablets (1.5mg ) by mouth at bedtime on Saturday and Sunday 11/10/23   [provider]  warfarin (COUMADIN ) 2 MG tablet Take 2 mg by mouth See admin instructions. Take 1 tablet (2mg ) by mouth at  bedtime Monday - Friday.    [provider]    Allergies: Aspirin     Review of Systems  Respiratory:  Positive for shortness of breath.     Updated Vital Signs BP (!) 124/56   Pulse 81   Temp 98.4 F (36.9 C) (Oral)   Resp (!) 25   SpO2 92%   Physical Exam Vitals and nursing note reviewed.  Constitutional:      Appearance: Normal appearance.  HENT:     Head: Normocephalic and atraumatic.     Mouth/Throat:     Mouth: Mucous membranes are moist.  Eyes:     General: No scleral icterus.        Right eye: No discharge.        Left eye: No discharge.     Conjunctiva/sclera: Conjunctivae normal.  Cardiovascular:     Rate and Rhythm: Normal rate and regular rhythm.     Pulses: Normal pulses.  Pulmonary:     Effort: Pulmonary effort is normal.     Breath sounds: Rhonchi present.  Abdominal:     General: There is no distension.     Tenderness: There is no abdominal tenderness.  Musculoskeletal:        General: No deformity.     Cervical back: Normal range of motion.  Skin:    General: Skin is warm and dry.     Capillary Refill: Capillary refill takes less than 2 seconds.  Neurological:     Mental Status: He is alert.     Motor: No weakness.  Psychiatric:        Mood and Affect: Mood normal.     (all labs ordered are listed, but only abnormal results are displayed) Labs Reviewed  COMPREHENSIVE METABOLIC PANEL WITH GFR - Abnormal; Notable for the following components:      Result Value   Glucose, Bld 113 (*)    Creatinine, Ser 1.34 (*)    GFR, Estimated 56 (*)    All other components within normal limits  CBC WITH DIFFERENTIAL/PLATELET - Abnormal; Notable for the following components:   WBC 13.4 (*)    Neutro Abs 11.1 (*)    Monocytes Absolute 1.2 (*)    All other components within normal limits  PROTIME-INR - Abnormal; Notable for the following components:   Prothrombin Time 25.2 (*)    INR 2.2 (*)    All other components within normal limits  TROPONIN T, HIGH SENSITIVITY - Abnormal; Notable for the following components:   Troponin T High Sensitivity 25 (*)    All other components within normal limits  RESP PANEL BY RT-PCR (RSV, FLU A&B, COVID)  RVPGX2  PRO BRAIN NATRIURETIC PEPTIDE  I-STAT CG4 LACTIC ACID, ED  I-STAT CG4 LACTIC ACID, ED  TROPONIN T, HIGH SENSITIVITY    EKG: None  Radiology: DG Chest 2 View Result Date: 09/21/2024 EXAM: 2 VIEW(S) XRAY OF THE CHEST 09/21/2024 03:14:00 PM COMPARISON: 05/17/2024 CLINICAL HISTORY: SOB FINDINGS: LUNGS  AND PLEURA: Hyperinflation with flattened diaphragms consistent with COPD. No focal pulmonary opacity. No pleural effusion. No pneumothorax. HEART AND MEDIASTINUM: Atherosclerotic plaque. Surgical changes in mediastinum. No acute abnormality of the cardiac and mediastinal silhouettes. BONES AND SOFT TISSUES: Intact sternotomy wires. Multilevel degenerative changes of thoracic spine. No acute osseous abnormality. IMPRESSION: 1. Hyperinflation with flattened diaphragms compatible with COPD. Electronically signed by: Dayne Hassell MD 09/21/2024 03:35 PM EST RP Workstation: [id number]    Procedures   Medications Ordered in the ED  ipratropium-albuterol  (DUONEB) 0.5-2.5 (  3) MG/3ML nebulizer solution 3 mL (3 mLs Nebulization Given 09/21/24 1626)  methylPREDNISolone  sodium succinate (SOLU-MEDROL ) 40 mg/mL injection 40 mg (40 mg Intravenous Given 09/21/24 1622)  sodium chloride  0.9 % bolus 500 mL (0 mLs Intravenous Stopped 09/21/24 1914)                                Medical Decision Making Amount and/or Complexity of Data Reviewed Labs: ordered. Radiology: ordered.  Risk Prescription drug management.   This patient presents to the ED for concern of shortness of breath, this involves an extensive number of treatment options, and is a complaint that carries with it a high risk of complications and morbidity.   Differential diagnosis includes: COPD exacerbation, pneumonia, flu, COVID, PE, STEMI  Co morbidities:  history of COPD, recent pneumonia infection, PAD, CHF, hypertension, hyperlipidemia   Additional history:  Patient followed by [facility] pulmonology and was seen most recently on 12/11 for increased mucus production and shortness of breath.  He was recently changed to a different inhaler, that has been working better for him.  Lab Tests:  I Ordered, and personally interpreted labs.  The pertinent results include: Initial troponin 25, delta troponin 19, leukocytosis of 13.4, creatinine  1.34  Imaging Studies:  I ordered imaging studies including chest x-ray I independently visualized and interpreted imaging which showed no acute abnormalities to explain patient's symptoms I agree with the radiologist interpretation  Cardiac Monitoring/ECG:  The patient was maintained on a cardiac monitor.  I personally viewed and interpreted the cardiac monitored which showed an underlying rhythm of: Normal sinus rhythm  Medicines ordered and prescription drug management:  I ordered medication including  Medications  ipratropium-albuterol  (DUONEB) 0.5-2.5 (3) MG/3ML nebulizer solution 3 mL (3 mLs Nebulization Given 09/21/24 1626)  methylPREDNISolone  sodium succinate (SOLU-MEDROL ) 40 mg/mL injection 40 mg (40 mg Intravenous Given 09/21/24 1622)  sodium chloride  0.9 % bolus 500 mL (0 mLs Intravenous Stopped 09/21/24 1914)   for soup the exacerbation and AKI Reevaluation of the patient after these medicines showed that the patient improved I have reviewed the patients home medicines and have made adjustments as needed  Test Considered:   none  Critical Interventions:   none  Consultations Obtained: None  Problem List / ED Course:     ICD-10-CM   1. AKI (acute kidney injury)  N17.9     2. COPD exacerbation (HCC)  J44.1       MDM: 73 year old male who presents for evaluation of worsening shortness of breath.  Lung sounds were rhonchorous so DuoNeb and methylprednisone were given.  Chest x-ray is consistent with COPD.  Patient's leukocytosis likely secondary to recent steroid use.  At this time, he continues to deny any shortness of breath and his pulse ox is stable at 92 to 93% on room air.  He did initially require supplemental oxygen while in triage, but that was no longer required during my evaluation.  His initial lab work did show an elevated creatinine, secondary to an AKI due to dehydration.  I gave him 500 cc of fluid as he has a history of heart failure and did not  want to cause fluid overload.  His initial troponin was 25, repeat 19 so no evidence of heart strain patient. symptoms are likely secondary to a mild COPD exacerbation.  I did send him home with a 3-day course of high-dose prednisone  and antibiotics outpatient.  Recommendation for patient  to follow-up with his PCP.  His vital signs are stable.  Patient is appropriate for discharge at this time.   Dispostion:  After consideration of the diagnostic results and the patients response to treatment, I feel that the patient would benefit from supportive care.   Final diagnoses:  AKI (acute kidney injury)  COPD exacerbation [facility])    ED Discharge Orders          Ordered    predniSONE  (DELTASONE ) 20 MG tablet  Daily        09/21/24 2121    azithromycin  (ZITHROMAX  Z-PAK) 250 MG tablet  Daily        09/21/24 2121               Baylin Cabal S, PA-C 09/21/24 2139    Pamella Ozell LABOR, DO 09/25/24 1959  "

## 2024-09-21 NOTE — ED Triage Notes (Signed)
 PT BIB [facility] from a [employer].  Feeling flu-like x 2 days.  Out running errands and became weak and dizzy.  Hot to touch, rhonchi in lower lobes, Hx of COPD, recent PNA. Was tx by his Lung doctor for that. .   2L at baseline 96%, Sitting 218/110, standing 172/98 on scene. Hr 80 NS, CBG 145  500mL NS, 650 tylenol  from EMS  18G L. FA.

## 2024-09-21 NOTE — Discharge Instructions (Addendum)
 It was a pleasure taking care of you today. You were seen in the Emergency Department for evaluation of shortness of breath. Your work-up was reassuring. Your CT/Xray/Labs showed no acute infection or abnormality to explain your symptoms.  I suspect your symptoms are likely secondary to a mild COPD exacerbation.  As discussed, I am sending you home with 3 days worth of prednisone .  Please take this medication every day for the next 3 days in the morning.  I am also sending you home with another short course of antibiotics.  Please take the antibiotics as written on the prescription.  Refer to the attached documentation for further management of your symptoms. Follow up with your PCP if your symptoms continue.  Please return to the ER if you experience chest pain, trouble breathing, intractable nausea/vomiting or any other life threatening illnesses.

## 2024-09-21 NOTE — ED Provider Triage Note (Signed)
 Emergency Medicine Provider Triage Evaluation Note  Dennis Preston , a 73 y.o. male  was evaluated in triage.  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 For the last couple of days had felt funny and dizzy with productive cough. Felt like he had fever. Has h/o COPD and wears O2 at night but not during the day typically. Recently was treated by lung doctor for bronchitis. Also c/o headache. Denies visual changes, or numbness/tingling/stroke-like sxs.   L at baseline 96%, Sitting 218/110, standing 172/98 on scene. Hr 80 NS, CBG 145 500mL NS, 650 tylenol  from EMS 18G L. FA.  Review of Systems  As above  Physical Exam  BP (!) 129/57   Pulse 72   Temp 97.6 F (36.4 C) (Oral)   Resp 18   SpO2 98%  Gen:   Awake, no distress   Resp:  Normal effort, rhonchi in lower lobes, on 2L [place] satting 96% MSK:   Moves extremities without difficulty   Medical Decision Making  Medically screening exam initiated at 2:35 PM.  Appropriate orders placed.  Dennis Preston was informed that the remainder of the evaluation will be completed by another provider, this initial triage assessment does not replace that evaluation, and the importance of remaining in the ED until their evaluation is complete.  Labs/imaging orders placed. Patient will be moved to treatment space when one becomes available. BP 129/57 on arrival.   Franklyn Sid SAILOR, MD 09/21/24 [phone]

## 2024-10-27 ENCOUNTER — Ambulatory Visit: Admitting: Cardiology

## 2024-11-02 ENCOUNTER — Ambulatory Visit: Payer: Medicare (Managed Care) | Admitting: Cardiology

## 2024-11-02 ENCOUNTER — Other Ambulatory Visit: Payer: Self-pay

## 2024-11-02 ENCOUNTER — Telehealth: Payer: Self-pay

## 2024-11-02 ENCOUNTER — Encounter: Payer: Self-pay | Admitting: Cardiology

## 2024-11-02 VITALS — BP 160/82 | HR 76 | Ht 65.0 in | Wt 158.0 lb

## 2024-11-02 DIAGNOSIS — I5032 Chronic diastolic (congestive) heart failure: Secondary | ICD-10-CM | POA: Diagnosis not present

## 2024-11-02 DIAGNOSIS — Z951 Presence of aortocoronary bypass graft: Secondary | ICD-10-CM

## 2024-11-02 DIAGNOSIS — I483 Typical atrial flutter: Secondary | ICD-10-CM | POA: Diagnosis not present

## 2024-11-02 DIAGNOSIS — D6869 Other thrombophilia: Secondary | ICD-10-CM

## 2024-11-02 DIAGNOSIS — I480 Paroxysmal atrial fibrillation: Secondary | ICD-10-CM | POA: Diagnosis not present

## 2024-11-02 NOTE — Telephone Encounter (Signed)
 Per Dr. Kennyth - Patient can proceed with Watchman implant.  Spoke with patient. Confirmed this with him. Patient on coumadin  managed by Dr. Bernita office. Arranged procedure date 12/08/24 at 11 AM (arrival 830). He will need 4 therapeutic INRs prior. Patient reports seeing Thomasina, RN at coumadin  clinic with Dr. Bernita office. His number [phone]. Advised patient will call Mitch to arrange INRs. Explained to patient will also need regular procedural lab work within 30 days of procedure time.   Explained to patient he should call Watchman RN if he develops any symptoms, illness, urgent care visits or hospital visits prior to procedure date. He verbalized understanding. Explained instruction letter will be mailed as he doesn't use MyChart.   Called Mitch at [employer]. Explained patient scheduled LAAO 12/08/24 and will need 4 weekly INRs prior. Thomasina stated that would not be an issue. He will call Watchman RN with any questions or concerns.

## 2024-11-02 NOTE — Patient Instructions (Signed)
 Medication Instructions:  Your physician recommends that you continue on your current medications as directed. Please refer to the Current Medication list given to you today.  *If you need a refill on your cardiac medications before your next appointment, please call your pharmacy*  Testing/Procedures: Watchman  Your physician has requested that you have Left atrial appendage (LAA) closure device implantation is a procedure to put a small device in the LAA of the heart. The LAA is a small sac in the wall of the heart's left upper chamber. Blood clots can form in this area. The device, Watchman closes the LAA to help prevent a blood clot and stroke.  You will be contacted by Nurse Navigator, Danielle to schedule your pre-procedure visit and procedure date. If you have any questions she can be reached at [phone].   Follow-Up: At [facility], you and your health needs are our priority.  As part of our continuing mission to provide you with exceptional heart care, our providers are all part of one team.  This team includes your primary Cardiologist (physician) and Advanced Practice Providers or APPs (Physician Assistants and Nurse Practitioners) who all work together to provide you with the care you need, when you need it.

## 2024-11-02 NOTE — Telephone Encounter (Signed)
 SABRA

## 2024-11-03 NOTE — Telephone Encounter (Addendum)
 Received call from Mitch at [facility] with Dr. Bernita office. He stated patient INR today was 2.6. He will have the patient come in on Fridays for lab draws. He reports his range is 2.0-3.0. He will call Watchman RN if patient misses a day or has an out of range lab value.

## 2024-12-08 ENCOUNTER — Inpatient Hospital Stay (HOSPITAL_COMMUNITY): Admit: 2024-12-08 | Payer: Medicare (Managed Care) | Admitting: Cardiology

## 2024-12-08 ENCOUNTER — Encounter (HOSPITAL_COMMUNITY): Payer: Self-pay

## 2025-01-10 ENCOUNTER — Encounter (INDEPENDENT_AMBULATORY_CARE_PROVIDER_SITE_OTHER)

## 2025-01-10 ENCOUNTER — Ambulatory Visit (INDEPENDENT_AMBULATORY_CARE_PROVIDER_SITE_OTHER): Admitting: Vascular Surgery

## 2025-01-23 ENCOUNTER — Ambulatory Visit: Payer: Medicare (Managed Care) | Admitting: Cardiology
